# Patient Record
Sex: Male | Born: 1935
Health system: Southern US, Community
[De-identification: ages and names within clinical notes are randomized; demographics above are authoritative.]

## PROBLEM LIST (undated history)

## (undated) DIAGNOSIS — I4891 Unspecified atrial fibrillation: Secondary | ICD-10-CM

## (undated) DIAGNOSIS — E785 Hyperlipidemia, unspecified: Secondary | ICD-10-CM

## (undated) DIAGNOSIS — D649 Anemia, unspecified: Secondary | ICD-10-CM

## (undated) DIAGNOSIS — I5022 Chronic systolic (congestive) heart failure: Secondary | ICD-10-CM

## (undated) DIAGNOSIS — I447 Left bundle-branch block, unspecified: Secondary | ICD-10-CM

## (undated) DIAGNOSIS — G4730 Sleep apnea, unspecified: Secondary | ICD-10-CM

## (undated) DIAGNOSIS — E039 Hypothyroidism, unspecified: Secondary | ICD-10-CM

## (undated) DIAGNOSIS — D696 Thrombocytopenia, unspecified: Secondary | ICD-10-CM

## (undated) DIAGNOSIS — G4733 Obstructive sleep apnea (adult) (pediatric): Secondary | ICD-10-CM

## (undated) DIAGNOSIS — I428 Other cardiomyopathies: Secondary | ICD-10-CM

## (undated) DIAGNOSIS — N400 Enlarged prostate without lower urinary tract symptoms: Secondary | ICD-10-CM

## (undated) DIAGNOSIS — I2510 Atherosclerotic heart disease of native coronary artery without angina pectoris: Secondary | ICD-10-CM

## (undated) DIAGNOSIS — M1990 Unspecified osteoarthritis, unspecified site: Secondary | ICD-10-CM

## (undated) DIAGNOSIS — F32A Depression, unspecified: Secondary | ICD-10-CM

## (undated) DIAGNOSIS — I10 Essential (primary) hypertension: Secondary | ICD-10-CM

## (undated) DIAGNOSIS — F329 Major depressive disorder, single episode, unspecified: Secondary | ICD-10-CM

## (undated) HISTORY — DX: Unspecified osteoarthritis, unspecified site: M19.90

## (undated) HISTORY — PX: CHOLECYSTECTOMY: SHX55

## (undated) HISTORY — PX: CARDIAC DEFIBRILLATOR PLACEMENT: SHX171

## (undated) HISTORY — DX: Major depressive disorder, single episode, unspecified: F32.9

## (undated) HISTORY — PX: TRANSURETHRAL RESECTION OF PROSTATE: SHX73

## (undated) HISTORY — DX: Sleep apnea, unspecified: G47.30

## (undated) HISTORY — DX: Chronic systolic (congestive) heart failure: I50.22

## (undated) HISTORY — DX: Atherosclerotic heart disease of native coronary artery without angina pectoris: I25.10

## (undated) HISTORY — DX: Hyperlipidemia, unspecified: E78.5

## (undated) HISTORY — DX: Other cardiomyopathies: I42.8

## (undated) HISTORY — DX: Unspecified atrial fibrillation: I48.91

## (undated) HISTORY — DX: Anemia, unspecified: D64.9

## (undated) HISTORY — DX: Essential (primary) hypertension: I10

## (undated) HISTORY — DX: Obstructive sleep apnea (adult) (pediatric): G47.33

## (undated) HISTORY — DX: Depression, unspecified: F32.A

## (undated) HISTORY — PX: OTHER SURGICAL HISTORY: SHX169

## (undated) HISTORY — DX: Thrombocytopenia, unspecified: D69.6

## (undated) HISTORY — DX: Hypothyroidism, unspecified: E03.9

## (undated) HISTORY — DX: Left bundle-branch block, unspecified: I44.7

## (undated) HISTORY — DX: Benign prostatic hyperplasia without lower urinary tract symptoms: N40.0

---

## 1998-02-02 ENCOUNTER — Inpatient Hospital Stay (HOSPITAL_COMMUNITY): Admission: EM | Admit: 1998-02-02 | Discharge: 1998-02-06 | Payer: Self-pay | Admitting: Internal Medicine

## 2002-11-30 ENCOUNTER — Ambulatory Visit (HOSPITAL_COMMUNITY): Admission: RE | Admit: 2002-11-30 | Discharge: 2002-11-30 | Payer: Self-pay | Admitting: Gastroenterology

## 2003-03-29 ENCOUNTER — Inpatient Hospital Stay (HOSPITAL_COMMUNITY): Admission: EM | Admit: 2003-03-29 | Discharge: 2003-04-03 | Payer: Self-pay | Admitting: Emergency Medicine

## 2003-03-29 ENCOUNTER — Encounter: Payer: Self-pay | Admitting: Emergency Medicine

## 2003-03-30 ENCOUNTER — Encounter: Payer: Self-pay | Admitting: Internal Medicine

## 2003-03-30 ENCOUNTER — Encounter (INDEPENDENT_AMBULATORY_CARE_PROVIDER_SITE_OTHER): Payer: Self-pay | Admitting: *Deleted

## 2003-05-10 ENCOUNTER — Encounter (HOSPITAL_COMMUNITY): Admission: RE | Admit: 2003-05-10 | Discharge: 2003-08-08 | Payer: Self-pay | Admitting: Emergency Medicine

## 2003-05-22 ENCOUNTER — Encounter: Admission: RE | Admit: 2003-05-22 | Discharge: 2003-05-22 | Payer: Self-pay | Admitting: Orthopedic Surgery

## 2003-08-09 ENCOUNTER — Encounter (HOSPITAL_COMMUNITY): Admission: RE | Admit: 2003-08-09 | Discharge: 2003-08-24 | Payer: Self-pay | Admitting: Cardiology

## 2005-10-11 ENCOUNTER — Ambulatory Visit (HOSPITAL_COMMUNITY): Admission: RE | Admit: 2005-10-11 | Discharge: 2005-10-11 | Payer: Self-pay | Admitting: Internal Medicine

## 2005-10-17 ENCOUNTER — Encounter: Admission: RE | Admit: 2005-10-17 | Discharge: 2005-10-17 | Payer: Self-pay | Admitting: Cardiology

## 2005-10-23 ENCOUNTER — Encounter: Admission: RE | Admit: 2005-10-23 | Discharge: 2005-10-23 | Payer: Self-pay | Admitting: Family Medicine

## 2006-04-08 ENCOUNTER — Encounter: Admission: RE | Admit: 2006-04-08 | Discharge: 2006-04-08 | Payer: Self-pay | Admitting: Family Medicine

## 2006-06-25 DIAGNOSIS — I2510 Atherosclerotic heart disease of native coronary artery without angina pectoris: Secondary | ICD-10-CM

## 2006-06-25 HISTORY — DX: Atherosclerotic heart disease of native coronary artery without angina pectoris: I25.10

## 2006-09-02 ENCOUNTER — Ambulatory Visit (HOSPITAL_COMMUNITY): Admission: RE | Admit: 2006-09-02 | Discharge: 2006-09-03 | Payer: Self-pay | Admitting: Urology

## 2006-09-02 ENCOUNTER — Encounter (INDEPENDENT_AMBULATORY_CARE_PROVIDER_SITE_OTHER): Payer: Self-pay | Admitting: Specialist

## 2007-01-03 ENCOUNTER — Ambulatory Visit (HOSPITAL_COMMUNITY): Admission: RE | Admit: 2007-01-03 | Discharge: 2007-01-03 | Payer: Self-pay | Admitting: Cardiology

## 2007-01-09 ENCOUNTER — Inpatient Hospital Stay (HOSPITAL_COMMUNITY): Admission: AD | Admit: 2007-01-09 | Discharge: 2007-01-11 | Payer: Self-pay | Admitting: Cardiology

## 2007-01-29 ENCOUNTER — Ambulatory Visit (HOSPITAL_COMMUNITY): Admission: RE | Admit: 2007-01-29 | Discharge: 2007-01-30 | Payer: Self-pay | Admitting: Cardiology

## 2007-05-20 ENCOUNTER — Encounter: Admission: RE | Admit: 2007-05-20 | Discharge: 2007-05-20 | Payer: Self-pay | Admitting: Family Medicine

## 2009-06-16 ENCOUNTER — Telehealth: Payer: Self-pay | Admitting: Internal Medicine

## 2009-06-22 DIAGNOSIS — I4811 Longstanding persistent atrial fibrillation: Secondary | ICD-10-CM | POA: Insufficient documentation

## 2009-06-22 DIAGNOSIS — E1169 Type 2 diabetes mellitus with other specified complication: Secondary | ICD-10-CM | POA: Insufficient documentation

## 2009-06-22 DIAGNOSIS — F329 Major depressive disorder, single episode, unspecified: Secondary | ICD-10-CM

## 2009-06-22 DIAGNOSIS — F3289 Other specified depressive episodes: Secondary | ICD-10-CM | POA: Insufficient documentation

## 2009-06-22 DIAGNOSIS — E785 Hyperlipidemia, unspecified: Secondary | ICD-10-CM

## 2009-06-22 DIAGNOSIS — E039 Hypothyroidism, unspecified: Secondary | ICD-10-CM

## 2009-06-22 DIAGNOSIS — G4733 Obstructive sleep apnea (adult) (pediatric): Secondary | ICD-10-CM | POA: Insufficient documentation

## 2009-06-23 ENCOUNTER — Ambulatory Visit: Payer: Self-pay | Admitting: Internal Medicine

## 2009-06-23 DIAGNOSIS — I472 Ventricular tachycardia: Secondary | ICD-10-CM | POA: Insufficient documentation

## 2009-12-18 ENCOUNTER — Emergency Department (HOSPITAL_COMMUNITY): Admission: EM | Admit: 2009-12-18 | Discharge: 2009-12-18 | Payer: Self-pay | Admitting: Emergency Medicine

## 2010-01-05 ENCOUNTER — Encounter: Payer: Self-pay | Admitting: Family Medicine

## 2010-01-11 ENCOUNTER — Encounter: Payer: Self-pay | Admitting: Family Medicine

## 2010-06-28 ENCOUNTER — Encounter: Payer: Self-pay | Admitting: Internal Medicine

## 2010-07-03 ENCOUNTER — Ambulatory Visit (HOSPITAL_COMMUNITY)
Admission: RE | Admit: 2010-07-03 | Discharge: 2010-07-03 | Payer: Self-pay | Source: Home / Self Care | Attending: Cardiology | Admitting: Cardiology

## 2010-07-03 ENCOUNTER — Encounter: Payer: Self-pay | Admitting: Internal Medicine

## 2010-08-09 ENCOUNTER — Institutional Professional Consult (permissible substitution) (INDEPENDENT_AMBULATORY_CARE_PROVIDER_SITE_OTHER): Payer: Medicare Other | Admitting: Internal Medicine

## 2010-08-09 ENCOUNTER — Encounter: Payer: Self-pay | Admitting: Internal Medicine

## 2010-08-09 DIAGNOSIS — R0609 Other forms of dyspnea: Secondary | ICD-10-CM

## 2010-08-09 DIAGNOSIS — R0989 Other specified symptoms and signs involving the circulatory and respiratory systems: Secondary | ICD-10-CM

## 2010-08-16 NOTE — Assessment & Plan Note (Signed)
Summary: Pulmonayr/ new pt eval with walking sats = ok x 3laps   Visit Type:  Initial Consult Copy to:  Dr. Juluis Rainier Primary Provider/Referring Provider:  Dr. Juluis Rainier  CC:  Abnormal Spirometry.  History of Present Illness: 77 yowm never smoker referred to pulmonary clinic by Dr Zachery Dauer for doe / abn pfts  August 09, 2010  1st pulmonary office eval doe worse x years no recent change. Main problem is pulling the trash cans when they are full up to street.  otherwise denies sob, cough, hoarsness. Pt denies any significant sore throat, dysphagia, itching, sneezing,  nasal congestion or excess secretions,  fever, chills, sweats, unintended wt loss, pleuritic or exertional cp, hempoptysis, change in activity tolerance  orthopnea pnd or leg swelling Pt also denies any obvious fluctuation in symptoms with weather or environmental change or other alleviating or aggravating factors.       Current Medications (verified): 1)  Klor-Con M20 20 Meq Cr-Tabs (Potassium Chloride Crys Cr) .... Take One Tablet Once Daily 2)  Coreg 3.125 Mg Tabs (Carvedilol) .... Take One Tablet  Two Times A Day 3)  Levothyroxine Sodium 125 Mcg Tabs (Levothyroxine Sodium) .... Take One Tablet Once Daily 4)  Furosemide 40 Mg Tabs (Furosemide) .Marland Kitchen.. 1 Every Am and 1/2 At Bedtime 5)  Aspirin 81 Mg Tabs (Aspirin) .... Take One Tablet Once Daily 6)  Warfarin Sodium 5 Mg Tabs (Warfarin Sodium) .... Take As Directed 7)  Altace 2.5 Mg Caps (Ramipril) .... Take One Tablet Once Daily 8)  Simvastatin 10 Mg Tabs (Simvastatin) .Marland Kitchen.. 1 Once Daily 9)  Amiodarone Hcl 200 Mg Tabs (Amiodarone Hcl) .Marland Kitchen.. 1 Once Daily 10)  Cpap .... At Bedtime  Allergies (verified): No Known Drug Allergies  Past History:  Past Medical History:  1. Nonischemic cardiomyopathy, EF 10%.   2. BPH.   3. Chronic heart failure, typically compensated.   4. Hypothyroidism.   5. Chronic left bundle branch block.   6. Atrial fibrillation/flutter. >  amiodarone since "2010"  7. Depression.   8. Probable sleep apnea. > positive PSS July 2011 > wears cpap  9. Hyperlipidemia.  10.Medtronic Concerto model number [id number] arthritis 11. ABN PFT's Mild airflow obstruction on coreg      - PFTs 07/03/10 FEV1  2.72 (73%) ratio 65 and DLC 72 > corrects to 86      - No desats walking August 09, 2010   Past Surgical History: 1. TURP as well as circumcision in March of 2008.  2. Status post cholecystectomy.  3. Medtronic Concerto model number [id number] Cholecystectomy 2000  Family History: Heart dz- Mother had CHF  Social History: Married Never smoker Retired from [employer]  No ETOH  Review of Systems       The patient complains of shortness of breath with activity and joint stiffness or pain.  The patient denies shortness of breath at rest, productive cough, non-productive cough, coughing up blood, chest pain, irregular heartbeats, acid heartburn, indigestion, loss of appetite, weight change, abdominal pain, difficulty swallowing, sore throat, tooth/dental problems, headaches, nasal congestion/difficulty breathing through nose, sneezing, itching, ear ache, anxiety, depression, hand/feet swelling, rash, change in color of mucus, and fever.    Vital Signs:  Patient profile:   77 year old male Weight:      239 pounds BMI:     29.20 O2 Sat:      95 % on Room air Temp:     98.0 degrees F oral Pulse rate:   73 /  minute BP sitting:   124 / 72  (left arm)  Vitals Entered By: Vernie Murders (August 09, 2010 8:53 AM)  O2 Flow:  Room air  Serial Vital Signs/Assessments:  Comments: 9:31 AM Ambulatory Pulse Oximetry  Resting; HR__62___    02 Sat__97%ra___  Lap1 (185 feet)   HR__115___   02 Sat__93%ra___ Lap2 (185 feet)   HR__102___   02 Sat__93%ra___    Lap3 (185 feet)   HR__104___   02 Sat___93%ra__  ___Test Completed without Difficulty ___Test Stopped due to:   By: Vernie Murders    Physical Exam  Additional Exam:  wt 243  > 239 August 09, 2010  amb minimally hoarse wm with minimal pseudowheeze resolves with purse lip maneuver  HEENT: nl dentition, turbinates, and orophanx. Nl external ear canals without cough reflex NECK :  without JVD/Nodes/TM/ nl carotid upstrokes bilaterally LUNGS: no acc muscle use, clear to A and P bilaterally without cough on insp or exp maneuvers CV:  RRR  no s3 or murmur or increase in P2, no edema  ABD:  soft and nontender with nl excursion in the supine position. No bruits or organomegaly, bowel sounds nl MS:  warm without deformities, calf tenderness, cyanosis or clubbing SKIN: warm and dry without lesions   NEURO:  alert, approp, no deficits     CXR  Procedure date:  06/28/2010  Findings:      Cardiac enlargement. No CHF  Impression & Recommendations:  Problem # 1:  DYSPNEA (ICD-786.09)  He is on three meds that could cause breathing difficulties, none of which is obviously limiting him at this point  1) Coreg may be causing mild airflow obstruction - would consider Bystolic, the most beta -1  selective Beta blocker available in sample form, with bisoprolol the most selective generic choice  on the market. before considering any bronchodilators should his breathing/ wheezing worsen  2) Amiodarone can cause decrease in DLC0 but this is not significant and there is no cough or desat with ex  3) ACE may be causing the mild hoarseness/ pseudowheeze on exam -  ACE inhibitors are problematic in  pts with airway complaints because  even experienced pulmonologists can't always distinguish ace effects from copd/asthma.  By themselves they don't actually cause a problem, much like oxygen can't by itself start a fire, but they certainly serve as a powerful catalyst or enhancer for any "fire"  or inflammatory process in the upper airway, be it caused by an ET  tube or more commonly reflux (especially in the obese or pts with known GERD or who are on biphoshonates).  In the era of ARB  near equivalency until we have a better handle on the reversibility of the airway problem, it just makes sense to avoid ace entirely in the short run and then decide later, having established a level of airway control using a reasonable limited regimen, whether to add back ace but even then being very careful to observe the pt for worsening airway control and number of meds used/ needed to control symptoms.    See instructions for specific recommendations   Orders: New Patient Level V (65784) Pulse Oximetry, Ambulatory ([address])  Medications Added to Medication List This Visit: 1)  Furosemide 40 Mg Tabs (Furosemide) .Marland Kitchen.. 1 every am and 1/2 at bedtime 2)  Simvastatin 10 Mg Tabs (Simvastatin) .Marland Kitchen.. 1 once daily 3)  Amiodarone Hcl 200 Mg Tabs (Amiodarone hcl) .Marland Kitchen.. 1 once daily 4)  Cpap  .... At bedtime  Patient Instructions:  1)  Your lung function and/ or breathing may be reduced by your medications but it does not appear at this point to be bad enough to recommend any changes because these medications are good for your heart - there are more expensive substitutes available should your breathing worsen which can be considered (we have samples of everything  but the amiodarone). 2)  if breathing gets worse return to [facility] clinic

## 2010-09-10 LAB — CBC
MCH: 32.4 pg (ref 26.0–34.0)
MCHC: 34.3 g/dL (ref 30.0–36.0)
MCV: 94.4 fL (ref 78.0–100.0)
WBC: 6.7 10*3/uL (ref 4.0–10.5)

## 2010-09-10 LAB — POCT I-STAT, CHEM 8
Calcium, Ion: 1.02 mmol/L — ABNORMAL LOW (ref 1.12–1.32)
Creatinine, Ser: 1.7 mg/dL — ABNORMAL HIGH (ref 0.4–1.5)
Glucose, Bld: 106 mg/dL — ABNORMAL HIGH (ref 70–99)
HCT: 42 % (ref 39.0–52.0)
Sodium: 141 mEq/L (ref 135–145)
TCO2: 23 mmol/L (ref 0–100)

## 2010-09-10 LAB — DIFFERENTIAL
Basophils Absolute: 0 10*3/uL (ref 0.0–0.1)
Monocytes Absolute: 0.8 10*3/uL (ref 0.1–1.0)

## 2010-09-10 LAB — POCT CARDIAC MARKERS
CKMB, poc: <1 ng/mL — ABNORMAL LOW (ref 1.0–8.0)
Troponin i, poc: <0.05 ng/mL (ref 0.00–0.09)

## 2010-09-11 ENCOUNTER — Encounter (INDEPENDENT_AMBULATORY_CARE_PROVIDER_SITE_OTHER): Payer: Self-pay | Admitting: *Deleted

## 2010-09-21 NOTE — Letter (Signed)
Summary: Appointment - Reminder 2  [facility], Main Office  [address]   [place], [place] [address]   Phone: [phone]  Fax: [phone]     September 11, 2010 MRN: [id number]   [name] [name] [address] [place], [place]  [address]   Dear [name],  Our records indicate that it is time to schedule a follow-up appointment.  Dr.Allred recommended that you follow up with [place] in April. It is very important that we reach you to schedule this appointment. We look forward to participating in your health care needs. Please contact us at the number listed above at your earliest convenience to schedule your appointment.  If you are unable to make an appointment at this time, give [place] a call so we can update our records.     Sincerely,   Glass blower/designer

## 2010-11-07 NOTE — Discharge Summary (Signed)
NAMEDAWIT, TANKARD               ACCOUNT NO.:  [id number]   MEDICAL RECORD NO.:  [id number]          PATIENT TYPE:  INP   LOCATION:  3715                         FACILITY:  [facility]   PHYSICIAN:  Armanda Magic, M.D.     DATE OF BIRTH:  [birth year]   DATE OF ADMISSION:  01/09/2007  DATE OF DISCHARGE:  01/11/2007                               DISCHARGE SUMMARY   DISCHARGE DIAGNOSES:  1. Acute-on-chronic left systolic heart failure, resolved.  2. Chronic left ventricular systolic failure.  3. Nonischemic cardiomyopathy, ejection fraction is 15%.  4. Benign prostatic hypertrophy.  5. Hypothyroidism.  6. Chronic left bundle-branch block.  7. Hyperlipidemia.  8. History of atrial fibrillation.   HOSPITAL COURSE:  Mr. Paulsen is a 77 year old male patient with known  nonischemic cardiomyopathy who was admitted with a several-day history  of increasing shortness of breath and a ten-pound weight gain.  He was  admitted, diuresed aggressively and by January 11, 2007 was felt to be  ready for discharge to home.   His labs include a white count of 2.9, hemoglobin 13.3, hematocrit 39.3,  platelets 140, sodium 139, potassium 3.7, BUN 21, creatinine 1.25.  BNP  initially 936, but was down to 632 by discharge.  TSH 4.005.   Initial chest x-ray showed cardiomegaly with interstitial edema.   DISCHARGE MEDICATIONS:  1. Lasix 80 mg one p.o. b.i.d.  2. K-Dur 20 mEq a day.  3. Levothyroxine 125 mcg a day.  4. Baby aspirin daily.  5. Coumadin as prior to admission.  6. Lexapro 10 mg a day.  7. Coreg 3.125 mg b.i.d.  8. Altace 2.5 mg daily.   Renal, low-sodium, volume restricted up to 2000 mL daily diet, weigh  daily and bring weights to the office visit.  Patient to see Dr. Mayford Knife  on January 13, 2007 at 3:30 p.m.  Increase activity slowly.  Patient is  discharged to home in stable and improved condition.      Guy Franco, P.A.      Armanda Magic, M.D.  Electronically Signed    LB/MEDQ  D:   01/24/2007  T:  01/25/2007  Job:  [id number]

## 2010-11-07 NOTE — Discharge Summary (Signed)
NAMEDAXON, KYNE               ACCOUNT NO.:  [id number]   MEDICAL RECORD NO.:  [id number]          PATIENT TYPE:  INP   LOCATION:  6531                         FACILITY:  [facility]   PHYSICIAN:  Francisca December, M.D.  DATE OF BIRTH:  1931-09-25   DATE OF ADMISSION:  01/29/2007  DATE OF DISCHARGE:  01/30/2007                               DISCHARGE SUMMARY   DISCHARGE DIAGNOSES:  1. Nonischemic cardiomyopathy, ejection fraction 15%.  2. Known history of coronary artery disease with cardiac      catheterization January 03, 2007.  3. Paroxysmal atrial fibrillation/atrial flutter.  4. Systemic anticoagulation with Coumadin.  5. Hypertension.  6. Hypothyroidism.  7. Dyslipidemia.  8. Chronic compensated congestive heart failure.   HISTORY OF PRESENT ILLNESS:  Mr.  [name] [name] has documented nonischemic  cardiomyopathy as in recent cardiac catheterization of January 03, 2007,  that showed nonobstructive coronary artery disease.  He also has chronic  compensated congestive heart failure as well as a left bundle branch  block and therefore an AICD was implanted on January 29, 2007 under the  care of Dr. Corliss Marcus.  The Medtronic device was utilized.   The patient tolerated the procedure well.  He remained in the hospital  overnight and was discharged to home the following day.  At the time of  this dictation, the chest x-ray has not been done.  Assuming there is no  sign of pneumothorax or other abnormality, the patient will be  discharged to home later today.   MEDICATIONS:  1. Levothyroxine 125 mcg daily.  2. Cipro 10 mg a day.  3. Coreg 3.125 mg twice a day.  4. Potassium 20 mEq 1 tablet daily.  5. Lasix 80 mg twice a day.  6. Xanax p.r.n.  7. Coumadin as prior to admission starting January 31, 2007.  8. Vicodin 1-2 tablets every 8 hours as needed for pain.   Activity and wound care per our device instruction sheet.  Diet should  be a low sodium, heart healthy diet.  Followup in the  Coumadin Clinic on  February 04, 2007 at 11:15 a.m.  Followup with Tillman Sers, N.P. for Dr.  Amil Amen on February 13, 2007 at 9:50 a.m.      [name] [name], P.A.      Francisca December, M.D.  Electronically Signed    LB/MEDQ  D:  01/30/2007  T:  01/30/2007  Job:  [id number]   cc:   Sigmund Hazel, M.D.

## 2010-11-07 NOTE — H&P (Signed)
[name], [name]               ACCOUNT NO.:  [id number]   MEDICAL RECORD NO.:  [id number]          PATIENT TYPE:  INP   LOCATION:  3715                         FACILITY:  [facility]   PHYSICIAN:  Armanda Magic, M.D.     DATE OF BIRTH:  August 05, 1931   DATE OF ADMISSION:  01/09/2007  DATE OF DISCHARGE:                              HISTORY & PHYSICAL   CHIEF COMPLAINT:  Fluid overload.   HISTORY OF PRESENT ILLNESS:  [name] [name] is a 77 year old male patient  with a known history of nonischemic cardiomyopathy with an EF around 10%  documented by cardiac catheterization on January 03, 2007.  That  catheterization revealed nonobstructive coronary artery disease.  A 2-D  echo in June 2008 showed moderately dilated LV dimensions with severe LV  dysfunction and paradoxical septal motion due to bundle branch block.  There was moderate aortic valve sclerosis but no stenosis.  He did  exhibit mild aortic insufficiency, moderate mitral regurgitation, and  mild tricuspid regurgitation.  Left atrial enlargement was present.   He was seen in the office on January 09, 2007 complaining of a several-day  history of increasing shortness of breath, lower extremity edema, and  weight gain of about 10 pounds over a 10-day period.  He increased his  Lasix with little effect.  He was admitted for aggressive medical  management, including IV diuresis.   PAST MEDICAL HISTORY:  1. Nonischemic cardiomyopathy, EF 10%.  2. BPH.  3. Chronic heart failure, typically compensated.  4. Hypothyroidism.  5. Chronic left bundle branch block.  6. Atrial fibrillation/flutter.  7. Depression.  8. Probable sleep apnea.  9. Hyperlipidemia.   PAST SURGICAL HISTORY:  1. TURP as well as circumcision in March of 2008.  2. Status post cholecystectomy.   SOCIAL HISTORY:  Former smoker, social alcohol use, no illicit drug use,  married.   FAMILY HISTORY:  Noncontributory.   ALLERGIES:  NO KNOWN DRUG ALLERGIES.   MEDICATIONS  PRIOR TO ADMISSION:  1. Levothyroxine 125 mcg a day.  2. Baby aspirin daily.  3. Altace 2.5 mg a day.  4. Lasix 40 mg a day.  5. Coumadin.  6. Lexapro 10 mg a day.  7. Coreg 3.125 p.o. b.i.d.   Weights from July 8 to July 17 go from 236 pounds to 242 pounds  respectively.   PHYSICAL EXAMINATION:  VITAL SIGNS:  Blood pressure 100/68, heart rate  68, respirations 20, weight 242.8.  HEENT:  Grossly normal.  Sclerae is clear.  Conjunctivae normal.  Nares  without drainage.  NECK:  No carotids, no bruits, JVD, or thyromegaly.  CHEST:  Bibasilar crackles.  HEART:  Irregular.  ABDOMEN:  Obese.  LOWER EXTREMITY:  No pitting edema.  NEURO:  Alert and oriented, normal mood and affect.  SKIN:  Warm and dry.   LABS:  EKG and chest x-ray are currently pending.   ASSESSMENT AND PLAN:  1. Acute-on-chronic left systolic heart failure.  2. Nonischemic cardiomyopathy, ejection fraction of 10%.  3. Atrial fibrillation/flutter.  4. Hypothyroidism, treated.  5. Hyperlipidemia.  6. Depression.  7. Probable sleep apnea.  The patient was seen and examined by Dr. Armanda Magic who will perform  daily weights, strict input and output, check labs, intravenous  diuresis, and, ultimately, the patient will need an implantable  cardioverter/defibrillator plus or minus a biventricular device  implantable, which I suspect will happen within the next 2 weeks.      [name] [name], P.A.      Armanda Magic, M.D.  Electronically Signed    LB/MEDQ  D:  01/10/2007  T:  01/11/2007  Job:  [id number]

## 2010-11-07 NOTE — Cardiovascular Report (Signed)
NAME:  [name] [name], [name] [name]               ACCOUNT NO.:  [id number]   MEDICAL RECORD NO.:  [id number]          PATIENT TYPE:  OIB   LOCATION:  2853                         FACILITY:  [facility]   PHYSICIAN:  Armanda Magic, M.D.     DATE OF BIRTH:  07/25/1931   DATE OF PROCEDURE:  DATE OF DISCHARGE:  01/03/2007                            CARDIAC CATHETERIZATION   This is a 77 year old male with a history of hypertension,  hypothyroidism, nonischemic cardiomyopathy, who presents with increasing  shortness of breath and abnormal Cardiolite, showing a reversible defect  in the anterior wall.  He now presents for cardiac catheterization.   PROCEDURES:  Left heart cath, coronary angiography, left  ventriculography.   OPERATOR:  Armanda Magic, M.D.   INDICATIONS:  Chest pain, shortness of breath, abnormal Cardiolite.   COMPLICATIONS:  None.   IV ACCESS:  Via right femoral artery, 6-French sheath.   The patient was brought to cardiac catheterization laboratory in a  fasting non-sedated state.  Informed consent was obtained.  The patient  was connected to continuous heart rate and pulse oximetry monitoring,  intermittent blood pressure monitoring.  The right groin was prepped and  draped in sterile fashion.  One percent Xylocaine was used for local  anesthesia.  Using modified Seldinger technique, a 6-French sheath was  placed in the right femoral artery.  Under fluoroscopic guidance, a 6-  [place] JL-4 catheter was placed in left coronary artery.  Multiple cine  films were taken at 30-degree RAO, 40-degree LAO views.  The catheter  could not adequately engage the coronary ostium, and the catheter was  exchanged out over a guidewire for 6-French JL-5 catheter, which  successfully engaged the coronary ostia.  Multiple cine films were taken  at 30-degree RAO and 40-degree LAO views.  This catheter was then  exchanged out over a guidewire for a 6-French JR-4 catheter, which was  placed in fluoroscopic  guidance, right coronary artery.  Multiple cine  films were taken at 30-degree RAO, 40-degree LAO views.  His catheter  was then exchanged out over a guidewire for a 6-French angled pigtail  catheter, which was placed under fluoroscopic guidance in the left  ventricular cavity.  Left ventriculography was performed in the 30-  degree RAO view, using a total of 30 mL of contrast at 15 mL per second.  The catheter was then pulled back across the aortic valve with no  significant gradient noted.  At the end of procedure, all catheters and  sheaths were removed.  Manual compression was performed, so adequate  hemostasis was obtained.  The patient was transferred back to room in  stable condition.   RESULTS:  Left main coronary artery is widely patent and trifurcates  into left anterior descending artery of the ramus branch and left  circumflex artery.   The left anterior descending artery is widely patent throughout its  course.  The apex giving rise to two diagonal branches, both of which  are widely patent.  There are room luminal irregularities up to 30%.   The ramus branch is widely patent with some luminal irregularities and  trifurcates into three daughter branches, all that were widely patent.   The left circumflex is widely patent throughout its course in the AV  groove, with luminal irregularities up to 20% in 30%.   The right coronary artery is widely patent and bifurcates distally in  the posterior descending artery and posterior lateral artery, both of  which are widely patent.  There are luminal irregularities of 20%.   Left ventriculography shows severe LV dysfunction, EF 10% to 20%, LV  pressure 102/20 mmHg, LVEDP 28 mmHg.  Aortic pressure was 1 to 2/78  mmHg.  1. Non-obstructive coronary disease.  2. Markedly dilated LV with severe LV dysfunction, EF 10% to 20%.  3. Non-ischemic dilated cardiomyopathy.  4. Elevated LVEDP.  5. Shortness of breath.  6. A-fib flutter.   7. Depression.  8. Probable sleep apnea.   PLAN:  Aggressive medical therapy for heart failure, discontinue Imdur  for now, continue Altace of 2.5 mg a day and start Coreg 3.125 mg b.i.d.  Given that his blood pressure is borderline low, I would rather get him  off the Imdur and start him on a beta blocker to try controlled heart  rate better.  We will continue Lasix but increase to 40 mg a day,  because of elevated LVEDP.  Of note, I did discuss with his wife the  results, and she told me that he has been very depressed, sleeping most  of the day and having problems with early morning wakening and thinks  that he is very depressed, and I will start Lexapro 10 mg a day.  Will  get an outpatient sleep study to rule out sleep apnea, re-start his  Coumadin, and he will follow up with me in 1 week.  Down the road, he  will probably need a AICD, because of his LV dysfunction, plus or minus  BiV.      Armanda Magic, M.D.  Electronically Signed     TT/MEDQ  D:  01/03/2007  T:  01/04/2007  Job:  [id number]   cc:   Sigmund Hazel, M.D.

## 2010-11-07 NOTE — Op Note (Signed)
[name], [name]               ACCOUNT NO.:  [id number]   MEDICAL RECORD NO.:  [id number]          PATIENT TYPE:  INP   LOCATION:  2807                         FACILITY:  [facility]   PHYSICIAN:  Francisca December, M.D.  DATE OF BIRTH:  [birth year]   DATE OF PROCEDURE:  01/29/2007  DATE OF DISCHARGE:                               OPERATIVE REPORT   PROCEDURES PERFORMED:  1. Insertion, dual-chamber implantable cardiac defibrillator, with      biventricular pacing.  2. Left subclavian venogram.  3. Coronary sinus venogram.  4. Defibrillation threshold testing.   INDICATIONS:  [name] is a 77 year old man with severe  nonischemic cardiomyopathy, LVEF 10-20%.  He has NYHA class III symptoms  of heart failure.  He also has a widened QRS secondary to a left bundle  branch block.  He is brought to the catheterization laboratory at this  time for insertion of the above device for prophylaxis of serious  ventricular arrhythmia and for treatment with biventricular pacemaker.   PROCEDURAL NOTE:  The patient was brought to the cardiac catheterization  laboratory in the fasting state.  The left prepectoral region was  prepped and draped in the usual sterile fashion.  Local anesthesia was  obtained with infiltration of 1% lidocaine with epinephrine throughout  the left prepectoral region.  A left subclavian venogram was then  performed with a peripheral injection of 20 mL of Omnipaque.  A digital  cine angiogram was obtained and road-mapped to guide future left  subclavian puncture.  The venogram did demonstrate the vein to be widely  patent and coursing in a normal fashion over the anterior surface of the  first rib and beneath the middle third of the clavicle.  There was no  evidence for persistence of the left superior vena cava.   A 7-8 cm incision was then made in the deltopectoral groove and this was  carried down by sharp dissection and electrocautery to the prepectoral  fascia.   There a plane was lifted and a pocket formed inferiorly and  medially.  The pocket was then packed with a 1% kanamycin-soaked gauze.  Three separate left subclavian punctures were then performed with an 18-  gauge thin-wall needle, through which was passed a 0.038-inch tight J  guidewire.  Over the initial guidewire a 9-French tearaway sheath and  dilator were advanced.  The dilator and wire were removed and the  ventricular lead was advanced to the level of the right atrium.  The  sheath was torn away.  Using standard technique and fluoroscopic  landmarks, the lead was manipulated into the right ventricular apex.  There excellent pacing parameters were obtained as will be noted below.  This was an active-fixation lead and the screw was advanced as  appropriate.  It was tested for diaphragmatic pacing at 10 V and none  was found.  The lead was then sutured into place using three separate 0  silk ligatures.  Over the second guidewire a 7-French tearaway sheath  and dilator were then advanced.  The dilator and wire were removed and  the atrial lead  was advanced to the level of the right atrium and the  sheath was torn away.  Again using standard technique and fluoroscopic  landmarks, the lead was manipulated into the right atrial appendage.  This was an active-fixation lead as well and the screw was advanced as  appropriate.  It was tested for adequate pacing parameters and these are  reported below.  It was tested for diaphragmatic pacing at 10 V and none  was found.  The lead was then sutured into place using three separate 0  silk ligatures.  I then proceeded with cannulation of the coronary  sinus.  This proved to be quite difficult.  Attempts were made with a  Medtronic MB catheter, an MB-1 catheter and an MB-2 catheter, model  numbers 6216, [id number], and 6216A, respectively.  Finally with the  assistance of Dr. Lewayne Bunting and the use of the MB-2 catheter through  which was passed a  Josephson EP catheter, I was successfully able to  cannulate the coronary sinus.  The Josephson catheter was removed.  A  6215 80-cm balloon occluding venogram device was then advanced over a  0.025 wire into the coronary sinus.  The balloon was inflated and  angiograms performed in LAO and RAO projections.  An adequate left  lateral vein was selected and this was cannulated after removing the  balloon with a 0.014-inch Prowater guidewire.  Over this guidewire a [employer].  Jude QuickFlex model number 1156T, serial number [id number], was then  advanced into the left lateral vein.  There excellent pacing parameters  were obtained as will be noted below.  It was tested for diaphragmatic  pacing at 10 V and none was found.  The guiding catheter was then  removed by the slit technique and the lead was sutured into place using  three separate 0 silk ligatures.  The kanamycin-soaked gauze was then  removed from the pocket.  The pocket was copiously irrigated using 1%  kanamycin solution.  The leads were then attached to the pace shock  generator, carefully identifying each by its serial number and placing  each into the appropriate receptacle under the supervision of the  Medtronic representative.  Each lead was tightened into place and tested  for security.  The leads were then wound beneath the pacing generator  and the generator was placed in the pocket.  Previously I had had to  place a figure-of-eight hemostasis suture around the leads with an 0  silk.  The device was then secured to the pocket with an 0 silk  anchoring suture and to the pectoralis muscle.  There was generalized  oozing within the muscular wall and the walls of the wound.  Surgicel  was applied.  The wound was closed using 2-0 Vicryl in a running fashion  in two layers for the subcutaneous tissue.  The skin was approximated  using 4-0 Vicryl in a running subcuticular fashion.  Steri-Strips and a  sterile dressing were applied and  the patient was transported to the  recovery area in stable condition.   Prior to closing the pocket with the device in the pocket, I performed  defibrillation threshold testing.  Ventricular fibrillation was promptly  induced by the shock on T technique.  It was promptly detected and the  device charged, delivering a 20-joule shock.  Prompt return of atrial  fibrillation-flutter with pacing was obtained.  The shock impedance was  40 joules and the detection and charge time was 9 seconds.   EQUIPMENT  DATA:  The ventricular lead is a Teacher, English as a foreign language number [id number], serial number [id number].  The atrial lead is  a Medtronic CapSureFix model number B3077988, serial number [id number]. The  ventricular lead was reported above.  The pace shock generator is a  Medtronic Concerto model number D5359719, serial number [id number].   PACING DATA:  The right ventricular lead detected a 9.9 mV R wave.  The  pacing threshold was 0.8 V at 0.5 msec pulse width.  The impedance was  725 ohms,  resulting in a current at capture threshold of 1.3 mA.  The  atrial lead detected 2.3 mV flutter wave.  The impedance was 491 ohms.  The left ventricular lead detected a 10.2 mV R wave.  The pacing  threshold was 0.6 V at 0.5 msec pulse width.  The impedance was 941  ohms, resulting in a current at capture threshold of 0.6 mA.      Francisca December, M.D.  Electronically Signed     JHE/MEDQ  D:  01/29/2007  T:  01/29/2007  Job:  [id number]   cc:   Armanda Magic, M.D.

## 2010-11-10 NOTE — Discharge Summary (Signed)
NAME:  [name] [name], [name] [name]                         ACCOUNT NO.:  [id number]   MEDICAL RECORD NO.:  [id number]                   PATIENT TYPE:  INP   LOCATION:  0371                                 [facility]ACILITY:  [facility]   PHYSICIAN:  Deirdre Peer. Polite, M.D.              DATE OF BIRTH:  1931/11/29   DATE OF ADMISSION:  03/29/2003  DATE OF DISCHARGE:                                 DISCHARGE SUMMARY   DISCHARGE DIAGNOSES:  1. Newly diagnosed congestive heart failure, nonischemic etiology, ejection     fraction 20-30%.  2. Hypothyroid, elevated thyroid stimulating hormone on admission of 9.6.     Dosage adjusted.  Recommend four- to six-week follow-up.  3. Benign prostatic hypertrophy, asymptomatic.   DISCHARGE MEDICATIONS:  1. Proscar 5 mg daily.  2. Potassium 20 mEq daily.  3. Synthroid 375 mcg daily.  4. Lasix 40 mg daily.  5. Altace 5 mg daily.  6. Aspirin 325 mg daily.  7. Isosorbide 30 mg daily.  8. Aldactone 25 mg daily.  9. Coreg 3.125 mg b.i.d.   STUDIES:  The patient had a BMET, CBC essentially within normal limits.  Lipid panel:  Total cholesterol 161, LDL of 114, HDL of 35.  Serial cardiac  enzymes negative for ischemia.  TSH 9.6.   Chest x-ray:  Significant for cardiomegaly and congestive heart failure.   EKG:  Normal sinus rhythm with PVCs.   Echocardiogram:  Left ventricle moderately dilated, EF of 20-30%.   Cardiac catheterization report:  RCA, 20% proximal lesion.  LAD, 20%  proximal lesion.  Circumflex, 20% proximal lesion.   CONDITION ON DISCHARGE:  Stable.   FOLLOW-UP:  The patient is planned to follow up with primary M.D. in  approximately one week.  He is to follow up with [name] [name],  cardiology, in two weeks.   HISTORY OF PRESENT ILLNESS:  This is a 77 year old male with a known history  of BPH, hypertension, hypothyroidism, presented to the ED with complaints of  shortness of breath, dyspnea on exertion.  Evaluation in the ED was  significant for  congestive heart failure.  EKG and troponins on admission  were within normal limits.  However, admission was deemed necessary for  further evaluation and treatment as the patient did not carry a known  diagnosis of congestive heart failure.  Please see dictated H&P for further  details of HPI.   PAST MEDICAL HISTORY:  As stated above.   MEDICATIONS ON ADMISSION:  Proscar, Synthroid, and Cardura.   ALLERGIES:  None.   SOCIAL HISTORY:  The patient has not smoked for many years.  Social ETOH.  No drugs.   HOSPITAL COURSE:  #1 - NEWLY DIAGNOSED CONGESTIVE HEART FAILURE:  As stated,  [name] [name] was admitted to [facility] to a telemetry floor for  evaluation and treatment of signs and symptoms consistent with congestive  heart failure.  The patient was treated  with fluid restriction, ACE  inhibitor, and IV diuretics.  Also, a cardiology consult was obtained as  well as a 2-D echo.  Results of the 2-D echo showed significant LV  dysfunction with hypokinesis with an EF of 20-30%.  After ruling out for  cardiac ischemia, it was felt that risk stratification was indicated.  As  stated, the patient was seen by cardiology and was recommended to have  cardiac catheterization.  The patient's cardiac catheterization showed  significant LV dysfunction with an EF of 20-30%.  However, the patient's  coronary disease only revealed minimal disease, as stated above.  Medical  management is recommended for the patient's nonobstructive coronary artery  disease and significant LV dysfunction.  New medications include Imdur,  Altace, Lasix, Aldactone, and Coreg.  The patient will have outpatient  follow-up with [facility], [name] [name] in approximately two  weeks.  The patient is informed not to begin working until seen by Dr.  Mayford [name].  The patient may be started on cardiac rehabilitation.  As for the  patient's diet, he has been informed of the necessity to maintain a low-  sodium diet  and also a fluid-restricted diet of approximately 1500 mL.  He  has also been informed of the need to purchase a scale and to record his  weights daily and to notify his physicians if sudden change in weight of  three or more pounds within two days.  The patient expresses understanding  of this information as well as his wife.  At the day of discharge the  patient is able to ambulate the floor without dyspnea, without chest pain.  He is also able to sleep in the recumbent position without PND.  At this  time it is felt prudent that the patient be discharged to home, with  outpatient follow-up as listed above, two weeks with [name] [name] and in one  to two weeks with his primary M.D..   #2 - HYPOTHYROIDISM:  The patient carries a diagnosis of hypothyroidism.  TSH was obtained during this hospitalization.  The results showed an  elevation in the TSH.  The patient's history was reviewed, and he stated he  had not had his level checked in a while, and he does state that he has been  compliant with his medications.  Therefore, this suggests that the patient's  medications need to be adjusted.  The patient arrived taking Synthroid 125  mcg.  This has been increased to 175 mcg.  The patient's TSH should be  rechecked per his primary M.D. and medications titrated as needed.   The patient has other medical problems which are stable and have been stable  throughout this hospitalization.  He is asked to continue his current  treatment.  However, the patient is asked to not take his Cardura as he has  been placed  on several other medications that can have an antihypertensive effect as  well as Cardura.  Therefore, the patient is asked not to take Cardura at  this time and to follow up with his primary M.D. and only have medications  adjusted by his primary M.D. and cardiologist.                                               Deirdre Peer. Polite, M.D.   RDP/MEDQ  D:  04/03/2003  T:  04/03/2003  Job:   [id number]   cc:   [name] [name], M.D.  [address]  [place]  [place] [address]  Fax: [phone]

## 2010-11-10 NOTE — Op Note (Signed)
   NAME:  [name] [name], [name] [name]                         ACCOUNT NO.:  [id number]   MEDICAL RECORD NO.:  [id number]                   PATIENT TYPE:  AMB   LOCATION:  ENDO                                 FACILITY:  [facility]   PHYSICIAN:  Graylin Shiver, M.D.                DATE OF BIRTH:  April 15, 1932   DATE OF PROCEDURE:  11/30/2002  DATE OF DISCHARGE:                                 OPERATIVE REPORT   PROCEDURE PERFORMED:  Colonoscopy.   INDICATIONS FOR PROCEDURE:  Screening.   Informed consent was obtained.   PREMEDICATION:  Fentanyl 50 mcg IV, Versed 5 mg IV.   DESCRIPTION OF PROCEDURE:  With the patient in the left lateral decubitus  position, a rectal exam was performed.  No masses were felt.  The Olympus  colonoscope was inserted into the rectum and advanced around the colon to  the cecum.  Cecal landmarks were identified. The cecum and ascending colon  were normal.  The transverse colon was normal.  The descending colon and  sigmoid revealed moderate diverticulosis.  The rectum was normal.  The  patient tolerated the procedure well without complications.   IMPRESSION:  Diverticulosis of the left colon.   RECOMMENDATIONS:  I would recommend a follow-up colonoscopy again in 10  years.                                               Graylin Shiver, M.D.    [name]  D:  11/30/2002  T:  11/30/2002  Job:  [id number]   cc:   Chales Salmon. Abigail Miyamoto, M.D.  [address]  [place]  [place] [address]  Fax: [phone]

## 2010-11-10 NOTE — H&P (Signed)
NAME:  [name] [name], [name] [name]                         ACCOUNT NO.:  [id number]   MEDICAL RECORD NO.:  [id number]                   PATIENT TYPE:  INP   LOCATION:  0371                                 FACILITY:  [facility]   PHYSICIAN:  Hollice Espy, M.D.            DATE OF BIRTH:  January 17, 1932   DATE OF ADMISSION:  03/29/2003  DATE OF DISCHARGE:                                HISTORY & PHYSICAL   PRIMARY CARE PHYSICIAN:  Chales Salmon. Abigail Miyamoto, M.D.   HISTORY OF PRESENT ILLNESS:  This is a 77 year old white male with a past  medical history of  benign prostatic hypertrophy, hypertension and  hypothyroidism, who presents with new onset congestive heart failure. The  patient stated that  he has been  in generally good health but he has noted  some mild low dyspnea on exertion over the last few months. It has  progressed to the point where he cannot  lie  flat  on his back when he  sleeps, and over the last few  weeks he was having increased  visible  shortness of breath. Finally the patient became severely short of breath  last night and came into the  emergency room this morning.   On chest x-ray he was found to have bilateral pleural effusions and  otherwise  no other  complaints. He received some oxygen and Lasix  and  diuresed well and  began to feel better. Currently the patient states that  he is very tired. He did not sleep well for the last few days, but other  than that he  states that his  shortness of breath has improved. He denies  any headaches, visual changes, dysphagia, chest pain, palpitations. He has  been complaining of a nonproductive cough as well. He denies any wheezing.  He denies any abdominal pain, constipation, diarrhea, hematuria, dysuria,  extremity pain, weakness or nausea or vomiting.   PAST MEDICAL HISTORY:  1. Hypertension.  2. Hyperlipidemia.  3. Synthroid.  4. Benign prostatic hypertrophy.   MEDICATIONS:  1. Proscar 5 daily.  2. Synthroid 125 micrograms  daily.  3. Cardura 4 mg p.o. every day.   ALLERGIES:  The patient denies any type of allergies.   SOCIAL HISTORY:  The patient states he has not smoked in many years. He is  not a heavy drinker. He denies  any drug use. He denies any herbal  medications  or over-the-counter medications.   FAMILY HISTORY:  Noncontributory.   PHYSICAL EXAMINATION:  VITAL SIGNS:  Blood pressure 118/75, pulse 82,  respirations 20, afebrile, oxygen saturation 92% on 2 liters nasal cannula.  GENERAL:  This is a 77 year old white male who appears his  stated age  looking very fatigued but  otherwise in no active distress. He is alert and  oriented x3.  HEENT:  Normocephalic and atraumatic. He has no significant jugular venous  distention. Mucous membranes are normal.  CARDIOVASCULAR:  Regular rate and rhythm, S1, S2, could not appreciate a  murmur.  LUNGS:  Decreased breath sounds bilaterally.  ABDOMEN:  Soft, nontender, nondistended. Positive bowel sounds.  EXTREMITIES:  Trace pitting edema.   LABORATORY DATA:  White count 7.1, hemoglobin  14.2, hematocrit 41.6, MCV  90. Platelet count 136 with an 80% shift. Sodium 139, potassium 4, chloride  106, bicarbonate 28, BUN 13, creatinine 1.2, glucose 119. Total bilirubin  1.3. The rest of his liver function tests are normal. His CPK and MB are 113  and 2.7, index of 2.4. Troponin I 0.04. BNP  is 477.   Chest x-ray showed bilateral pleural effusions.   ASSESSMENT AND PLAN:  1. Apparently new onset congestive heart failure. Will get a 2D     echocardiogram and will diurese the patient with IV Lasix 80 b.i.d. We     will watch his renal insufficiency as he has a slightly elevated     creatinine as well. Have held his Cardura and instead have started him on     enalapril 2.5 b.i.d. Continuing with his aspirin  as well. Will continue     to check cardiac enzymes to ensure he has not had any ischemic event. His     EKG shows no evidence of any acute ST changes.  Will repeat a chest x-ray     tomorrow morning. In addition will have a dietitian talk to him about     advising a low sodium diet, and once he appears to be more diuresed will     start him on a beta blocker.  2. In regards to the patient's hypertension will be as above.  3. For the patient's benign prostatic hypertrophy will continue his Proscar.  4. In regards to the patient's hypothyroidism will continue his Synthroid.     Of note the patient had a sleep study done 8 years ago which was     negative, but according to the patient's wife, he snores all the time,     and anytime when he sits down it almost immediately falls asleep. The     patient states that he is never well rested and on physical examination     he is noted to have a very narrow airway. Will check a thyroid function     panel, and likely it is possible the patient may have  sleep apnea, and     once he is medically stable and cleared, he will likely  benefit from an     outpatient sleep study as well.                                               Hollice Espy, M.D.    SKK/MEDQ  D:  03/29/2003  T:  03/29/2003  Job:  [id number]   cc:   Chales Salmon. Abigail Miyamoto, M.D.  [address]  [place]  [place] [address]  Fax: [phone]

## 2010-11-10 NOTE — Cardiovascular Report (Signed)
NAME:  [name] [name], [name] [name]                         ACCOUNT NO.:  [id number]   MEDICAL RECORD NO.:  [id number]                   PATIENT TYPE:  OUT   LOCATION:  CATH                                 FACILITY:  [facility]   PHYSICIAN:  Armanda Magic, M.D.                  DATE OF BIRTH:  [birth year]   DATE OF PROCEDURE:  04/01/2003  DATE OF DISCHARGE:  04/01/2003                              CARDIAC CATHETERIZATION   <REFERRING PHYSICIAN/>  Dr. Gordy Councilman and Dr. Chales Salmon. Thacker.   PROCEDURE:  Left heart catheterization, coronary angiography, left  ventriculography. Intravenous access to the right femoral artery 6 French  sheath.   OPERATOR:  Armanda Magic, M.D.   INDICATIONS FOR PROCEDURE:  Congestive heart failure and dilated  cardiomyopathy.   COMPLICATIONS:  None.   INDICATIONS FOR PROCEDURE:  This is a 77 year old male who was admitted with  2 to 3 months of increasing  shortness of breath and dyspnea on exertion. He  was noted to be in congestive heart failure on  presentation and was noted  to have an ejection fraction of 20% to 30% with dilated cardiomyopathy. He  now presents for heart catheterization.   DESCRIPTION OF PROCEDURE:  The patient was brought to the cardiac  catheterization laboratory  in a fasting, nonsedated state. Informed consent  was obtained. The patient was connected to begin to his heart rate and pulse  oximetry monitoring and intermittent  blood pressure monitoring. The right  groin was prepped and draped in a sterile fashion. Then 1% Xylocaine was  used for local anesthesia.   Using the modified Seldinger technique, a 6 French sheath was placed in the  right femoral artery. Under fluoroscopic guidance a 6 [place] JL-4 catheter  was placed into the left coronary artery. Multiple plain films were taken in  a 30 degree RAO, 40 degree LAO views. This catheter was then exchanged out  over a guide wire for a 6 [place] JR-4 catheter which was placed under  fluoroscopic guidance in the right coronary artery. Multiple  cine films  were taken in the 30 degree RAO and 40 degree LAO views.   The catheter was then exchanged out over a guide wire for a 6 French  angled  pigtail catheter which was placed under fluoroscopic guidance in the left  ventricular cavity. Left ventriculography was performed using 30 mL of  contrast at 13 mL per second in the 30 degree RAO view. The catheter was  then pulled back across the aortic valve with no significant aortic valve  gradient.   At the end of the procedure all catheters and sheaths were removed. Manual  compression was performed. Adequate hemostasis was obtained. The patient was  transferred back to her room in stable condition.   RESULTS:  1. Left main coronary artery is widely patent and trifurcates into a left     anterior descending artery, ramus branch  and left anterior descending     coronary artery. The left circumflex artery is widely patent throughout     the course. The ramus branch has a 20% proximal narrowing and then it     trifurcates into 3 daughter branches, all of which are widely patent. The     LAD has a proximal 20% narrowing and then gives rise to 2 large diagonal     branches, both of which are widely patent. The rest of the LAD is widely     patent throughout the course of the apex. The right coronary artery had     20% luminal irregularities primarily in the midportion of the vessel. It     then bifurcates into the posterior descending artery and posterolateral     artery. The posterior descending artery has a 3% mid stenosis.  2. Left ventriculography revealed a moderately dilated left ventricular     cavity with inferior  akinesis and severe global hypokinesis, ejection     fraction of 20%. Left ventricular pressure 131/10 mmHg. Aortic pressure     126/75 mmHg. Left ventricular end diastolic pressure 22 mmHg.   ASSESSMENT:  1. Nonobstructive coronary disease.  2. Moderately  dilated left ventricular cavity with severe left ventricular     dysfunction consistent with idiopathic dilated cardiomyopathy.  3. Congestive heart failure, improved.   PLAN:  Aggressive medical therapy. Will start Imdur 30 mg a day, Altace 5 mg  a day, Lasix 40 mg a day. Start Aldactone 25 mg a day. Add Coreg 3.125 mg  b.i.d. We will check serum ferritin, serum electrophoresis, HIV and TSH to  rule out other causes of dilated cardiomyopathy.                                               Armanda Magic, M.D.    TT/MEDQ  D:  05/26/2003  T:  05/26/2003  Job:  [id number]   cc:   Chales Salmon. Abigail Miyamoto, M.D.  [address]  [place]  [place] [address]  Fax: [phone]

## 2010-11-10 NOTE — H&P (Signed)
[name] [name], [name] [name]               ACCOUNT NO.:  [id number]   MEDICAL RECORD NO.:  [id number]          PATIENT TYPE:  AMB   LOCATION:  DAY                          FACILITY:  [facility]   PHYSICIAN:  Sigmund I. Patsi Sears, M.D.DATE OF BIRTH:  03/21/1932   DATE OF ADMISSION:  09/02/2006  DATE OF DISCHARGE:                              HISTORY & PHYSICAL   PREOPERATIVE DIAGNOSES:  1. Benign prostatic hypertrophy.  2. Balanitis.   POSTOPERATIVE DIAGNOSES:  1. Benign prostatic hypertrophy.  2. Balanitis.   OPERATION:  1. TURP.  2. Circumcision.   SURGEON:  Sigmund I. Patsi Sears, M.D.   ANESTHESIA:  General LMA.   PREPARATION:  After appropriate preanesthesia the patient is brought to  the operating room and placed upon the operating room in the dorsal  supine position where general LMA anesthesia was introduced.  He was  then replaced in the dorsal lithotomy position where the pubis was  prepped with Betadine solution and draped in the usual fashion.  The  penis could only be prepped slightly, because of the rather severe  phimosis that was noted.  The patient had received IV antibiotics prior  to intubation.   REVIEW OF HISTORY:  This 77 year old male, has been evaluated for  bladder outlet symptoms, failing Cardura and  Proscar, and is also seen  for chronic balanitis with phimosis.  Because of the patient's history  of heart failure and atrial fibrillation he was evaluated by Dr. Eliott Nine preoperatively, with cardiology clearance preoperatively.  He is  now for surgical intervention.   PROCEDURE:  The urethra is dilated to a size 67 [place] with the [employer].  Cystoscopy reveals a chronically obstructed and enlarged  prostate secondary to trilobar BPH.  Trabeculation and early cellule  formation is noted in the bladder, but there is no evidence of  diverticular formation, and no evidence of bladder stone or tumor.  Clear efflux is seen from both orifices.   Resection is begun at the 7 o'clock to the 5 o'clock position, and then  from the 11 o'clock to the 7 o'clock position, and from the 1 o'clock to  the 5 o'clock positions.  Bleeding is electrocoagulated, and the chips  are evacuated free from the bladder.  A 24 Ainsworth catheter was placed  with 30 mL in the balloon, irrigated, and plugged.   Attention was then directed to the phimotic foreskin, where a blue  marking pen is used to mark out the entire circumcision, both the  pericoronal and periglandular surfaces.  Incisions are made, and the  foreskin is removed.  A frenular attachment is identified, clamped and  cut, and sutured so that the frenular artery would be controlled.   Four separate quadrants of 4-0 Vicryl suture are then created, and each  quadrant is closed with interrupted 4-0 Vicryl suture.  No bleeding was  noted.  A sterile dressing is then applied, and 0.25 plain Marcaine is  injected circumferentially around the base of the penis.  The patient  was then awakened and taken to the recovery room in good condition.  Sigmund I. Patsi Sears, M.D.  Electronically Signed     SIT/MEDQ  D:  09/02/2006  T:  09/02/2006  Job:  [id number]

## 2010-12-15 ENCOUNTER — Encounter: Payer: Self-pay | Admitting: *Deleted

## 2011-01-10 ENCOUNTER — Encounter: Payer: Self-pay | Admitting: Internal Medicine

## 2011-01-10 ENCOUNTER — Encounter: Payer: Self-pay | Admitting: *Deleted

## 2011-01-10 ENCOUNTER — Ambulatory Visit (INDEPENDENT_AMBULATORY_CARE_PROVIDER_SITE_OTHER): Payer: Medicare Other | Admitting: Internal Medicine

## 2011-01-10 DIAGNOSIS — I472 Ventricular tachycardia: Secondary | ICD-10-CM

## 2011-01-10 DIAGNOSIS — Z95810 Presence of automatic (implantable) cardiac defibrillator: Secondary | ICD-10-CM

## 2011-01-10 DIAGNOSIS — I509 Heart failure, unspecified: Secondary | ICD-10-CM

## 2011-01-10 DIAGNOSIS — I428 Other cardiomyopathies: Secondary | ICD-10-CM

## 2011-01-10 DIAGNOSIS — I4891 Unspecified atrial fibrillation: Secondary | ICD-10-CM

## 2011-01-10 LAB — ICD DEVICE OBSERVATION
AL AMPLITUDE: 1.1 mv
ATRIAL PACING ICD: 26.23 pct
BATTERY VOLTAGE: 2.61 V
FVT: 0
LV LEAD IMPEDENCE ICD: 584 Ohm
PACEART VT: 0
RV LEAD IMPEDENCE ICD: 480 Ohm
TOT-0006: 20080806000000
TZAT-0001ATACH: 3
TZAT-0002ATACH: NEGATIVE
TZAT-0002FASTVT: NEGATIVE
TZAT-0004SLOWVT: 8
TZAT-0005SLOWVT: 88 pct
TZAT-0005SLOWVT: 91 pct
TZAT-0012ATACH: 150 ms
TZAT-0012FASTVT: 200 ms
TZAT-0012SLOWVT: 200 ms
TZAT-0012SLOWVT: 200 ms
TZAT-0013SLOWVT: 3
TZAT-0018FASTVT: NEGATIVE
TZAT-0018SLOWVT: NEGATIVE
TZAT-0018SLOWVT: NEGATIVE
TZAT-0019ATACH: 6 V
TZAT-0019ATACH: 6 V
TZAT-0019FASTVT: 8 V
TZAT-0020ATACH: 1.5 ms
TZAT-0020ATACH: 1.5 ms
TZON-0003SLOWVT: 350 ms
TZST-0001ATACH: 5
TZST-0001FASTVT: 3
TZST-0001FASTVT: 4
TZST-0001FASTVT: 6
TZST-0001SLOWVT: 3
TZST-0001SLOWVT: 4
TZST-0002ATACH: NEGATIVE
TZST-0002FASTVT: NEGATIVE
TZST-0002FASTVT: NEGATIVE
TZST-0002FASTVT: NEGATIVE
TZST-0003SLOWVT: 35 J
VENTRICULAR PACING ICD: 98.04 pct

## 2011-01-10 NOTE — Assessment & Plan Note (Signed)
No documented evidence of ventricular tachycardia which I can find

## 2011-01-10 NOTE — Patient Instructions (Signed)
Your physician has recommended that you have a defibrillator generator change. Please see the instruction sheet given to you today for more information.     

## 2011-01-10 NOTE — Assessment & Plan Note (Signed)
Stable-class II

## 2011-01-10 NOTE — Assessment & Plan Note (Signed)
The patient has reached RRT with his device. We have discussed device generator replacement And potential risks including not limited to infection and lead fracture.. Given the long interval since reaching RRT, and my lack of immediate availability, Dr. Johney Frame has agreed to do this.  I would anticipate reprogramming the device DDDR with improved atrial sensitivity. His AV delay is considerably longer than I would've anticipated be necessary; however, His absence of symptoms suggests we should just leave it alone

## 2011-01-10 NOTE — Assessment & Plan Note (Signed)
He'll be maintained on amiodarone. We will discontinue his aspirin as it does not incrementally added benefit to warfarin

## 2011-01-10 NOTE — Progress Notes (Signed)
HPI: [name] [name] is a 77 y.o. male Seen at the request of [facility] cardiology for defibrillator management  He has a history of a nonischemic cardiomyopathy identified and confirmed by catheterization in 2008. Because of symptoms of heart failure and left bundle branch block he underwent CRT-D implantation with a Medtronic device and a 6947 defibrillator lead. He had a lazerus-like effect.  He currently has class II symptoms. He is able to climb stairs. He denies orthopnea nocturnal dyspnea peripheral edema. He has had no syncope. He has had a series of falls for which he has had a good explanation.  He also has a history of atrial for ablation. For this he was treated with amiodarone. About a year ago, there was an episode where his pulse and blood pressure could not be measured. It turned out as best as I can infer , that he was tracking atrial fibrillation.  His device was reprogrammed DDIR.  He has reached RRT April. Current Outpatient Prescriptions  Medication Sig Dispense Refill  . amiodarone (PACERONE) 200 MG tablet Take 200 mg by mouth daily.        Marland Kitchen aspirin 81 MG tablet Take 81 mg by mouth daily.        . carvedilol (COREG) 3.125 MG tablet Take 3.125 mg by mouth 2 (two) times daily with a meal.        . escitalopram (LEXAPRO) 10 MG tablet Take 10 mg by mouth daily.        . furosemide (LASIX) 40 MG tablet Take 40 mg by mouth. 1 qam and 1/2 qpm      . levothyroxine (SYNTHROID, LEVOTHROID) 125 MCG tablet Take 125 mcg by mouth daily.        . potassium chloride SA (K-DUR,KLOR-CON) 20 MEQ tablet Take 20 mEq by mouth daily.        . ramipril (ALTACE) 2.5 MG tablet Take 2.5 mg by mouth daily.        . simvastatin (ZOCOR) 10 MG tablet Take 10 mg by mouth at bedtime.        Marland Kitchen warfarin (COUMADIN) 5 MG tablet Take 5 mg by mouth daily.          No Known Allergies  Past Medical History  Diagnosis Date  . Nonischemic cardiomyopathy     ef 10 %  . BPH (benign prostatic hypertrophy)   .  Chronic heart failure     typically compensated  . Hypothyroidism   . Left bundle branch block   . Atrial fibrillation     fibrillation/flutter  . Depression   . Sleep apnea     Probable   . Hyperlipidemia     Past Surgical History  Procedure Date  . Transurethral resection of prostate     as circumcision in March of 2008  . Post cholecystectomy     No family history on file. Family history is notable for his mother having an enlarged heart  History   Social History  . Marital Status: Married    Spouse Name: N/A    Number of Children: N/A  . Years of Education: N/A   Occupational History  . Not on file.   Social History Main Topics  . Smoking status: Former Games developer  . Smokeless tobacco: Not on file  . Alcohol Use: Yes  . Drug Use: No  . Sexually Active:    Other Topics Concern  . Not on file   Social History Narrative  . No narrative on file  He is married. He has 4 children. He has 4 grandchildren. Does not use cigarettes alcohol or recreational drugs.  Fourteen point review of systems was negative except as noted in HPI and PMH  Except cataracts and arthritis particularly in his knees and bruisability related to his Coumadin PHYSICAL EXAMINATION  Blood pressure 112/65, pulse 68, resp. rate 16, height 6\' 3"  (1.905 m), weight 234 lb (106.142 kg).   Well developed and nourished older Caucasian male appearing his stated age in no acute distress HENT normal Neck supple with JVP-flat Carotids brisk and full without bruits Back without scoliosis or kyphosis Clear Regular rate and rhythm, no murmurs or gallops Device pocket well-healed Abd-soft with active BS without hepatomegaly or midline pulsation Femoral pulses 2+ distal pulses intact No Clubbing cyanosis edema Skin-warm and dry LN-neg submandibular and supraclavicular A & Oriented CN 3-12 normal  Grossly normal sensory and motor function Affect engaging .

## 2011-01-10 NOTE — Assessment & Plan Note (Addendum)
Continue current medications  I have reviewed with the patient also the potential benefits of genetic investigation for the lamin gene;  He will review this with the family.

## 2011-01-11 LAB — CBC WITH DIFFERENTIAL/PLATELET
Basophils Relative: 0.1 % (ref 0.0–3.0)
Eosinophils Relative: 3.9 % (ref 0.0–5.0)
Lymphocytes Relative: 16.8 % (ref 12.0–46.0)
Monocytes Absolute: 0.5 10*3/uL (ref 0.1–1.0)
Monocytes Relative: 9.1 % (ref 3.0–12.0)
Platelets: 120 10*3/uL — ABNORMAL LOW (ref 150.0–400.0)
RBC: 4.23 Mil/uL (ref 4.22–5.81)
RDW: 14.7 % — ABNORMAL HIGH (ref 11.5–14.6)

## 2011-01-11 LAB — BASIC METABOLIC PANEL
BUN: 21 mg/dL (ref 6–23)
Calcium: 9 mg/dL (ref 8.4–10.5)
Chloride: 104 mEq/L (ref 96–112)
Creatinine, Ser: 1.3 mg/dL (ref 0.4–1.5)
GFR: 55.47 mL/min — ABNORMAL LOW (ref >60.00)
Potassium: 3.8 mEq/L (ref 3.5–5.1)
Sodium: 140 mEq/L (ref 135–145)

## 2011-01-11 LAB — PROTIME-INR: Prothrombin Time: 22.9 s — ABNORMAL HIGH (ref 10.2–12.4)

## 2011-01-12 ENCOUNTER — Encounter: Payer: Medicare Other | Admitting: Internal Medicine

## 2011-01-16 ENCOUNTER — Ambulatory Visit (HOSPITAL_COMMUNITY)
Admission: RE | Admit: 2011-01-16 | Discharge: 2011-01-16 | Disposition: A | Discharge status: Home / Self Care | Payer: Medicare Other | Source: Ambulatory Visit | Attending: Internal Medicine | Admitting: Internal Medicine

## 2011-01-16 DIAGNOSIS — I428 Other cardiomyopathies: Secondary | ICD-10-CM

## 2011-01-16 DIAGNOSIS — I4891 Unspecified atrial fibrillation: Secondary | ICD-10-CM | POA: Insufficient documentation

## 2011-01-16 DIAGNOSIS — I509 Heart failure, unspecified: Secondary | ICD-10-CM | POA: Insufficient documentation

## 2011-01-16 DIAGNOSIS — I447 Left bundle-branch block, unspecified: Secondary | ICD-10-CM | POA: Insufficient documentation

## 2011-01-16 DIAGNOSIS — Z4502 Encounter for adjustment and management of automatic implantable cardiac defibrillator: Secondary | ICD-10-CM | POA: Insufficient documentation

## 2011-01-16 DIAGNOSIS — I5022 Chronic systolic (congestive) heart failure: Secondary | ICD-10-CM | POA: Insufficient documentation

## 2011-01-16 LAB — SURGICAL PCR SCREEN: MRSA, PCR: POSITIVE — AB

## 2011-01-16 LAB — APTT: aPTT: 31 seconds (ref 24–37)

## 2011-01-25 ENCOUNTER — Ambulatory Visit (INDEPENDENT_AMBULATORY_CARE_PROVIDER_SITE_OTHER): Payer: Medicare Other | Admitting: *Deleted

## 2011-01-25 ENCOUNTER — Encounter: Payer: Self-pay | Admitting: Internal Medicine

## 2011-01-25 DIAGNOSIS — I472 Ventricular tachycardia: Secondary | ICD-10-CM

## 2011-01-25 DIAGNOSIS — I428 Other cardiomyopathies: Secondary | ICD-10-CM

## 2011-01-25 DIAGNOSIS — Z95810 Presence of automatic (implantable) cardiac defibrillator: Secondary | ICD-10-CM

## 2011-01-25 DIAGNOSIS — I509 Heart failure, unspecified: Secondary | ICD-10-CM

## 2011-01-25 LAB — ICD DEVICE OBSERVATION
AL AMPLITUDE: 1.8 mv
AL IMPEDENCE ICD: 456 Ohm
AL THRESHOLD: 0.75 v
BAMS-0001: 171 {beats}/min
BATTERY VOLTAGE: 3.22 v
LV LEAD IMPEDENCE ICD: 570 Ohm
LV LEAD THRESHOLD: 0.75 v
RV LEAD AMPLITUDE: 14.8 mv
RV LEAD IMPEDENCE ICD: 437 Ohm
RV LEAD THRESHOLD: 1 v

## 2011-01-25 NOTE — Progress Notes (Signed)
icd wound check

## 2011-04-09 LAB — BASIC METABOLIC PANEL
BUN: 30 — ABNORMAL HIGH
CO2: 32
Calcium: 8.7
Chloride: 106
Chloride: 100
GFR calc Af Amer: >60
GFR calc Af Amer: >60
GFR calc Af Amer: >60
GFR calc non Af Amer: 51 — ABNORMAL LOW
GFR calc non Af Amer: 57 — ABNORMAL LOW
Potassium: 4.7
Potassium: 3.6
Potassium: 3.7
Sodium: 140
Sodium: 139

## 2011-04-09 LAB — COMPREHENSIVE METABOLIC PANEL
AST: 34
Albumin: 3.9
Alkaline Phosphatase: 71
BUN: 18
CO2: 29
Chloride: 99
Creatinine, Ser: 1.29
GFR calc non Af Amer: 55 — ABNORMAL LOW
Potassium: 4.3
Total Bilirubin: 1.5 — ABNORMAL HIGH

## 2011-04-09 LAB — PROTIME-INR
INR: 2 — ABNORMAL HIGH
Prothrombin Time: 21.7 — ABNORMAL HIGH
Prothrombin Time: 23.4 — ABNORMAL HIGH
Prothrombin Time: 23.6 — ABNORMAL HIGH

## 2011-04-09 LAB — CBC
HCT: 39.3
MCV: 89.9
Platelets: 140 — ABNORMAL LOW
RBC: 4.37
WBC: 6.9

## 2011-04-09 LAB — B-NATRIURETIC PEPTIDE (CONVERTED LAB)
Pro B Natriuretic peptide (BNP): 632 — ABNORMAL HIGH
Pro B Natriuretic peptide (BNP): 903 — ABNORMAL HIGH
Pro B Natriuretic peptide (BNP): 936 — ABNORMAL HIGH

## 2011-04-09 LAB — APTT: aPTT: 35

## 2011-04-19 ENCOUNTER — Encounter: Payer: Self-pay | Admitting: Internal Medicine

## 2011-04-19 ENCOUNTER — Ambulatory Visit (INDEPENDENT_AMBULATORY_CARE_PROVIDER_SITE_OTHER): Payer: Medicare Other | Admitting: Internal Medicine

## 2011-04-19 DIAGNOSIS — I428 Other cardiomyopathies: Secondary | ICD-10-CM

## 2011-04-19 DIAGNOSIS — I509 Heart failure, unspecified: Secondary | ICD-10-CM

## 2011-04-19 DIAGNOSIS — I4891 Unspecified atrial fibrillation: Secondary | ICD-10-CM

## 2011-04-19 DIAGNOSIS — Z95810 Presence of automatic (implantable) cardiac defibrillator: Secondary | ICD-10-CM

## 2011-04-19 LAB — ICD DEVICE OBSERVATION
AL AMPLITUDE: 1.5 mv
AL IMPEDENCE ICD: 437 Ohm
ATRIAL PACING ICD: 97.45 pct
CHARGE TIME: 1.431 s
FVT: 0
LV LEAD IMPEDENCE ICD: 589 Ohm
LV LEAD THRESHOLD: 0.75 V
RV LEAD IMPEDENCE ICD: 437 Ohm
TOT-0001: 0
TOT-0002: 0
TZAT-0001ATACH: 1
TZAT-0001ATACH: 2
TZAT-0001ATACH: 3
TZAT-0001SLOWVT: 2
TZAT-0002SLOWVT: NEGATIVE
TZAT-0012ATACH: 150 ms
TZAT-0012ATACH: 150 ms
TZAT-0018ATACH: NEGATIVE
TZAT-0018ATACH: NEGATIVE
TZAT-0019SLOWVT: 8 V
TZAT-0020ATACH: 1.5 ms
TZAT-0020FASTVT: 1.5 ms
TZAT-0020SLOWVT: 1.5 ms
TZON-0003ATACH: 350 ms
TZON-0003SLOWVT: 360 ms
TZON-0004VSLOWVT: 28
TZON-0005SLOWVT: 12
TZST-0001ATACH: 4
TZST-0001ATACH: 6
TZST-0001FASTVT: 2
TZST-0001FASTVT: 3
TZST-0001FASTVT: 5
TZST-0001SLOWVT: 4
TZST-0001SLOWVT: 6
TZST-0002ATACH: NEGATIVE
TZST-0002FASTVT: NEGATIVE
TZST-0002SLOWVT: NEGATIVE
TZST-0002SLOWVT: NEGATIVE

## 2011-04-19 NOTE — Assessment & Plan Note (Signed)
The patient's device was interrogated.  The information was reviewed. No changes were made in the programming.    

## 2011-04-19 NOTE — Assessment & Plan Note (Signed)
As above.

## 2011-04-19 NOTE — Assessment & Plan Note (Signed)
Stable on current medication.  

## 2011-04-19 NOTE — Progress Notes (Signed)
  HPI  [name] [name] is a 77 y.o. male Seen in followup for an ICD implanted for primary prevention in the setting of nonischemic cardiomyopathy identified and confirmed by catheterization in 2008. Because of symptoms of heart failure and left bundle branch block he underwent CRT-D implantation with a Medtronic device and a 6947 defibrillator lead. He had a lazerus-like effect.   He currently has class II symptoms. He is able to climb stairs. He denies orthopnea nocturnal dyspnea peripheral edema. He has had no syncope.  He also has a history of atrial fibrillation For this he was treated with amiodarone. Surveillance laboratories have been followed by Dr. Mayford Knife   Past Medical History  Diagnosis Date  . Nonischemic cardiomyopathy     ef 10 %  . BPH (benign prostatic hypertrophy)   . Chronic heart failure     typically compensated  . Hypothyroidism   . Left bundle branch block   . Atrial fibrillation     fibrillation/flutter  . Depression   . Sleep apnea     Probable   . Hyperlipidemia     Past Surgical History  Procedure Date  . Transurethral resection of prostate     as circumcision in March of 2008  . Post cholecystectomy     Current Outpatient Prescriptions  Medication Sig Dispense Refill  . amiodarone (PACERONE) 200 MG tablet Take 200 mg by mouth daily.        Marland Kitchen aspirin 81 MG tablet Take 81 mg by mouth daily.        . carvedilol (COREG) 3.125 MG tablet Take 3.125 mg by mouth 2 (two) times daily with a meal.        . escitalopram (LEXAPRO) 10 MG tablet Take 10 mg by mouth daily.        . furosemide (LASIX) 40 MG tablet Take 20 mg by mouth 2 (two) times daily. 2 qam and 1 qpm      . levothyroxine (SYNTHROID, LEVOTHROID) 125 MCG tablet Take 125 mcg by mouth daily.        . potassium chloride SA (K-DUR,KLOR-CON) 20 MEQ tablet Take 20 mEq by mouth daily.        . ramipril (ALTACE) 2.5 MG tablet Take 2.5 mg by mouth daily.        . simvastatin (ZOCOR) 10 MG tablet Take 10  mg by mouth at bedtime.        Marland Kitchen warfarin (COUMADIN) 5 MG tablet Take 5 mg by mouth daily.          No Known Allergies  Review of Systems negative except from HPI and PMH  Physical Exam Well developed and well nourished in no acute distress HENT normal E scleral and icterus clear Neck Supple Clear to ausculation Regular rate and rhythm,  Soft with active bowel sounds No clubbing cyanosis and edema Alert and oriented, grossly normal motor and sensory function Skin Warm and Dry  Assessment and  Plan \

## 2011-04-19 NOTE — Assessment & Plan Note (Signed)
Infrequent atrial fibrillation. I again suggested that his aspirin be discontinued. Dr. Mayford Knife has chosen to continue it.

## 2011-04-19 NOTE — Patient Instructions (Signed)

## 2011-07-19 ENCOUNTER — Encounter: Payer: Medicare Other | Admitting: *Deleted

## 2011-07-23 ENCOUNTER — Encounter: Payer: Self-pay | Admitting: *Deleted

## 2011-07-25 ENCOUNTER — Encounter: Payer: Self-pay | Admitting: Internal Medicine

## 2011-07-25 ENCOUNTER — Ambulatory Visit (INDEPENDENT_AMBULATORY_CARE_PROVIDER_SITE_OTHER): Payer: Medicare Other | Admitting: *Deleted

## 2011-07-25 DIAGNOSIS — Z95810 Presence of automatic (implantable) cardiac defibrillator: Secondary | ICD-10-CM

## 2011-07-25 DIAGNOSIS — I509 Heart failure, unspecified: Secondary | ICD-10-CM

## 2011-07-25 DIAGNOSIS — I4891 Unspecified atrial fibrillation: Secondary | ICD-10-CM

## 2011-07-28 LAB — REMOTE ICD DEVICE
AL IMPEDENCE ICD: 456 Ohm
AL THRESHOLD: 0.625 V
BAMS-0001: 170 {beats}/min
BATTERY VOLTAGE: 3.1781 V
FVT: 0
LV LEAD THRESHOLD: 0.875 V
PACEART VT: 0
RV LEAD AMPLITUDE: 11.3 mv
RV LEAD THRESHOLD: 1 V
TZAT-0001ATACH: 2
TZAT-0001SLOWVT: 1
TZAT-0001SLOWVT: 2
TZAT-0002ATACH: NEGATIVE
TZAT-0002ATACH: NEGATIVE
TZAT-0012ATACH: 150 ms
TZAT-0012FASTVT: 200 ms
TZAT-0012SLOWVT: 200 ms
TZAT-0012SLOWVT: 200 ms
TZAT-0018ATACH: NEGATIVE
TZAT-0018ATACH: NEGATIVE
TZAT-0018FASTVT: NEGATIVE
TZAT-0018SLOWVT: NEGATIVE
TZAT-0018SLOWVT: NEGATIVE
TZAT-0019ATACH: 6 V
TZAT-0019ATACH: 6 V
TZAT-0019ATACH: 6 V
TZAT-0019FASTVT: 8 V
TZAT-0020ATACH: 1.5 ms
TZAT-0020ATACH: 1.5 ms
TZAT-0020SLOWVT: 1.5 ms
TZON-0003SLOWVT: 360 ms
TZON-0003VSLOWVT: 340 ms
TZON-0004SLOWVT: 16
TZON-0004VSLOWVT: 28
TZST-0001ATACH: 5
TZST-0001FASTVT: 2
TZST-0001FASTVT: 6
TZST-0001SLOWVT: 3
TZST-0001SLOWVT: 5
TZST-0002ATACH: NEGATIVE
TZST-0002FASTVT: NEGATIVE
TZST-0002FASTVT: NEGATIVE
TZST-0002FASTVT: NEGATIVE
TZST-0002SLOWVT: NEGATIVE
TZST-0002SLOWVT: NEGATIVE
VENTRICULAR PACING ICD: 99.94 pct
VF: 0

## 2011-08-01 NOTE — Progress Notes (Signed)
Remote defib check w/icm  

## 2011-08-13 ENCOUNTER — Telehealth: Payer: Self-pay | Admitting: Oncology

## 2011-08-13 NOTE — Telephone Encounter (Signed)
called pts home s/w wife barbar and scheduled appt for 02/28.  will fax over letter to Dr. Zachery Dauer with appt d/t

## 2011-08-14 ENCOUNTER — Encounter: Payer: Self-pay | Admitting: Oncology

## 2011-08-14 ENCOUNTER — Encounter: Payer: Self-pay | Admitting: *Deleted

## 2011-08-14 ENCOUNTER — Telehealth: Payer: Self-pay | Admitting: Oncology

## 2011-08-14 DIAGNOSIS — D696 Thrombocytopenia, unspecified: Secondary | ICD-10-CM | POA: Insufficient documentation

## 2011-08-14 NOTE — Telephone Encounter (Signed)
Referred by Dr. Camillo Flaming Dx- Mild Thrombocytopenia

## 2011-08-23 ENCOUNTER — Other Ambulatory Visit: Payer: Medicare Other | Admitting: Lab

## 2011-08-23 ENCOUNTER — Ambulatory Visit (HOSPITAL_BASED_OUTPATIENT_CLINIC_OR_DEPARTMENT_OTHER): Payer: Medicare Other | Admitting: Oncology

## 2011-08-23 ENCOUNTER — Encounter: Payer: Self-pay | Admitting: Oncology

## 2011-08-23 ENCOUNTER — Telehealth: Payer: Self-pay | Admitting: Oncology

## 2011-08-23 ENCOUNTER — Ambulatory Visit (HOSPITAL_BASED_OUTPATIENT_CLINIC_OR_DEPARTMENT_OTHER): Payer: Medicare Other

## 2011-08-23 DIAGNOSIS — D696 Thrombocytopenia, unspecified: Secondary | ICD-10-CM

## 2011-08-23 DIAGNOSIS — N289 Disorder of kidney and ureter, unspecified: Secondary | ICD-10-CM

## 2011-08-23 DIAGNOSIS — E039 Hypothyroidism, unspecified: Secondary | ICD-10-CM

## 2011-08-23 DIAGNOSIS — E785 Hyperlipidemia, unspecified: Secondary | ICD-10-CM

## 2011-08-23 LAB — CBC WITH DIFFERENTIAL/PLATELET
BASO%: 0.4 % (ref 0.0–2.0)
Eosinophils Absolute: 0.2 10*3/uL (ref 0.0–0.5)
HCT: 41.5 % (ref 38.4–49.9)
MCHC: 34.1 g/dL (ref 32.0–36.0)
MONO#: 0.5 10*3/uL (ref 0.1–0.9)
NEUT#: 3.5 10*3/uL (ref 1.5–6.5)
NEUT%: 69.3 % (ref 39.0–75.0)
WBC: 5 10*3/uL (ref 4.0–10.3)
lymph#: 0.9 10*3/uL (ref 0.9–3.3)

## 2011-08-23 LAB — MORPHOLOGY
PLT EST: DECREASED
RBC Comments: NORMAL

## 2011-08-23 NOTE — Progress Notes (Signed)
[facility] INITIAL HEMATOLOGY CONSULTATION  Referral MD:   Dr. Juluis Rainier, M.D.   Reason for Referral: thrombocytopenia.     HPI: [name] [name] is a 78 year-old man with history of hypothyroidism, afib on Coumadin, nonischemic cardiomyopathy, hyperlipidemia.  He is not aware of diagnosis of thrombocytopenia until about 7 months ago.  On 01/20/2011, WBC 5.7; Hgb 13.9; Plt 120.  He has been on observation per PCP with follow up CBC.  On 08/07/2011, WBC 4.8; Hgb 13.9; Plt 104.  Other work up showed normal CMET except for slightly elevated Cr at 1.47.  TSH was 3.35 on 06/12/2011. He denies history of cirrhosis, hepatitis.  Given slight worsening of his thrombocytopenia, he was kindly referred to the [facility] for evaluation.  [name] [name] presented to the clinic for the first time today by himself.  He reports mild fatigue and SOB, DOE on heavy exertion such as walking up a steep incline or multiple flight of stairs.  He has bilateral knee pain which he attributes to osteoarthritis.    Patient denies headache, visual changes, confusion, drenching night sweats, palpable lymph node swelling, mucositis, odynophagia, dysphagia, nausea vomiting, jaundice, chest pain, palpitation, productive cough, gum bleeding, epistaxis, hematemesis, hemoptysis, abdominal pain, abdominal swelling, early satiety, melena, hematochezia, hematuria, skin rash, spontaneous bleeding, joint swelling, joint pain, heat or cold intolerance, bowel bladder incontinence, back pain, focal motor weakness, paresthesia, depression, suicidal or homocidal ideation, feeling hopelessness.     Past Medical History  Diagnosis Date  . Nonischemic cardiomyopathy     ef 10 %  . BPH (benign prostatic hypertrophy)   . Hypothyroidism   . Left bundle branch block   . Atrial fibrillation     fibrillation/flutter  . Depression   . Sleep apnea     on CPAP.    Marland Kitchen Hyperlipidemia   . Thrombocytopenia   . Osteoarthritis    :    Past Surgical History  Procedure Date  . Transurethral resection of prostate     for BPH  . Cholecystectomy   . Colonscopy     reportedly negative per pt; around 2005.    :   CURRENT MEDS: Current Outpatient Prescriptions  Medication Sig Dispense Refill  . amiodarone (PACERONE) 200 MG tablet Take 200 mg by mouth daily.        Marland Kitchen aspirin 81 MG tablet Take 81 mg by mouth daily.        . carvedilol (COREG) 3.125 MG tablet Take 3.125 mg by mouth 2 (two) times daily with a meal.        . furosemide (LASIX) 40 MG tablet Take 20 mg by mouth 2 (two) times daily. 2 qam and 1 qpm      . levothyroxine (SYNTHROID, LEVOTHROID) 125 MCG tablet Take 125 mcg by mouth daily.        . potassium chloride SA (K-DUR,KLOR-CON) 20 MEQ tablet Take 20 mEq by mouth daily.        . ramipril (ALTACE) 2.5 MG tablet Take 2.5 mg by mouth daily.        . simvastatin (ZOCOR) 10 MG tablet Take 10 mg by mouth at bedtime.        Marland Kitchen warfarin (COUMADIN) 5 MG tablet Take 5 mg by mouth daily.            No Known Allergies:  Family History  Problem Relation Age of Onset  . Heart failure Mother   . Heart disease Father   . Cancer Maternal  Uncle     Unknown subtype  :  History   Social History  . Marital Status: Married    Spouse Name: N/A    Number of Children: 4  . Years of Education: N/A   Occupational History  .      retired Optometrist; now in Research officer, political party   Social History Main Topics  . Smoking status: Former Games developer  . Smokeless tobacco: Not on file  . Alcohol Use: Yes  . Drug Use: No  . Sexually Active:    Other Topics Concern  . Not on file   Social History Narrative  . No narrative on file    REVIEW OF SYSTEM:  The rest of the 14-point review of sytem was negative.   Exam: ECOG 0-1  General:  Mildly obese man in no acute distress.  Eyes:  no scleral icterus.  ENT:  There were no oropharyngeal lesions.  Neck was without thyromegaly.  Lymphatics:  Negative  cervical, supraclavicular or axillary adenopathy.  Respiratory: lungs were clear bilaterally without wheezing or crackles.  Cardiovascular:  Regular rate and rhythm, S1/S2, without murmur, rub or gallop.  There was no pedal edema.  GI:  abdomen was soft, flat, nontender, nondistended, without organomegaly.  Muscoloskeletal:  no spinal tenderness of palpation of vertebral spine.  Skin exam was without echymosis, petichae.  Neuro exam was nonfocal.  Patient was able to get on and off exam table without assistance.  Gait was normal.  Patient was alerted and oriented.  Attention was good.   Language was appropriate.  Mood was normal without depression.  Speech was not pressured.  Thought content was not tangential.    LABS:  WBC 5.0; Hgb 14.1; MCV 94; Plt 118.   Blood smear review:   I personally reviewed the patient's peripheral blood smear today.  There was isocytosis.  There was no peripheral blast.  There was no schistocytosis, spherocytosis, target cell, rouleaux formation, tear drop cell.  There was no giant platelets or platelet clumps.  There were rare atypical lymphocytes.  There was slight hypolobation and hypogranulation.    ASSESSMENT AND PLAN:   1.  Hypothyroidism:  Clinically and by lab euthyroid.  He is on Synthroid. 2.  Hyperlipidemia:  He is on simvastatin. 3.  Nonischemic congestive hear failure:  He is euvolumic on exam today.  He is on ASA, amiodarone, Lasix, Coreg, ramipril.  Given his severely depressed EF, he is s/p placement of defibrillator and is on Coumadin.  4.  HTN:  Well controlled on Coreg, Lasix, Ramipril.  5.  Slight renal insufficiency:   Most likely due to ACE-I, Lasix.  There is no evidence of dehydration on exam today.  6.  Thrombocytopenia:  Differential diagnosis:  Most likely due to medication induced (amiodarone, simvastatin) vs hypothyroidism.  Given slight renal insufficiency, myeloma is on the differential.  There is very low clinical suspicion for ITP  given mild thrombocytopenia.  He has no concern for autoimmune process.  I cannot rule out early meylodysplastic syndrome given his progressive thrombocytopenia and his age.    Work up:  Pending HIV, SPEP, serum free light chain, Vit B12.    Recommendation:   - continue amiodarone and simvastatin since his thrombocytopenia is still very mild.  I discussed with patient the low risk of bleeding with plt >30K.  - I will continue to monitor his CBC in 2 and 4 months here at the [facility].  I will see him in 4 months.  -  In the future, when his plt continues to worsen to lower than 80K or he develops pancytopenia, then a bone marrow biopsy will be considered.  At this time, with such slight thrombocytopenia, even if he were to be diagnosed with MDS with a diagnostic bone marrow biopsy, treatment recommendation would be observation anyway.    [name] [name] expressed informed understanding of my explanation.  He agreed to the stated plan.    The length of time of the face-to-face encounter was 45 minutes. More than 50% of time was spent counseling and coordination of care.

## 2011-08-23 NOTE — Telephone Encounter (Signed)
appts made and printed for 4/6/9 2013  aom

## 2011-08-27 LAB — PROTEIN ELECTROPHORESIS, SERUM
Alpha-2-Globulin: 10.5 % (ref 7.1–11.8)
Beta Globulin: 5.2 % (ref 4.7–7.2)
Gamma Globulin: 13.3 % (ref 11.1–18.8)

## 2011-08-27 LAB — HIV ANTIBODY (ROUTINE TESTING W REFLEX): HIV: NONREACTIVE

## 2011-10-22 ENCOUNTER — Telehealth: Payer: Self-pay | Admitting: *Deleted

## 2011-10-22 ENCOUNTER — Other Ambulatory Visit (HOSPITAL_BASED_OUTPATIENT_CLINIC_OR_DEPARTMENT_OTHER): Payer: Medicare Other | Admitting: Lab

## 2011-10-22 DIAGNOSIS — D696 Thrombocytopenia, unspecified: Secondary | ICD-10-CM

## 2011-10-22 LAB — CBC WITH DIFFERENTIAL/PLATELET
BASO%: 0.8 % (ref 0.0–2.0)
Basophils Absolute: 0 10*3/uL (ref 0.0–0.1)
Eosinophils Absolute: 0.3 10*3/uL (ref 0.0–0.5)
HCT: 41 % (ref 38.4–49.9)
HGB: 13.7 g/dL (ref 13.0–17.1)
LYMPH%: 14 % (ref 14.0–49.0)
MCHC: 33.4 g/dL (ref 32.0–36.0)
MONO#: 0.7 10*3/uL (ref 0.1–0.9)
NEUT#: 3.3 10*3/uL (ref 1.5–6.5)
NEUT%: 65.2 % (ref 39.0–75.0)
Platelets: 127 10*3/uL — ABNORMAL LOW (ref 140–400)
WBC: 5 10*3/uL (ref 4.0–10.3)
lymph#: 0.7 10*3/uL — ABNORMAL LOW (ref 0.9–3.3)

## 2011-10-22 NOTE — Telephone Encounter (Signed)
Message copied by Wende Mott on Mon Oct 22, 2011 11:30 AM ------      Message from: HA, Raliegh Ip T      Created: Mon Oct 22, 2011  9:07 AM       Please call pt.  His platelet count is stable. This is medication induced.  But thrombocytopenia is not too bad.  No change.  Continue observation.  Thanks.

## 2011-10-22 NOTE — Telephone Encounter (Signed)
Notified pt of Dr. Lodema Pilot note.  He verbalized understanding and plans to keep next lab appt in June as scheduled.

## 2011-10-25 ENCOUNTER — Encounter: Payer: Self-pay | Admitting: Internal Medicine

## 2011-10-25 ENCOUNTER — Ambulatory Visit (INDEPENDENT_AMBULATORY_CARE_PROVIDER_SITE_OTHER): Payer: Medicare Other | Admitting: *Deleted

## 2011-10-25 DIAGNOSIS — I428 Other cardiomyopathies: Secondary | ICD-10-CM

## 2011-10-25 DIAGNOSIS — I509 Heart failure, unspecified: Secondary | ICD-10-CM

## 2011-10-25 DIAGNOSIS — I472 Ventricular tachycardia: Secondary | ICD-10-CM

## 2011-10-30 LAB — REMOTE ICD DEVICE
AL IMPEDENCE ICD: 399 Ohm
AL THRESHOLD: 0.625 V
BAMS-0001: 170 {beats}/min
BATTERY VOLTAGE: 3.1781 V
PACEART VT: 0
RV LEAD AMPLITUDE: 11.4 mv
RV LEAD THRESHOLD: 0.875 V
TOT-0002: 0
TOT-0006: 20120724000000
TZAT-0001FASTVT: 1
TZAT-0001SLOWVT: 1
TZAT-0002ATACH: NEGATIVE
TZAT-0002ATACH: NEGATIVE
TZAT-0002ATACH: NEGATIVE
TZAT-0002FASTVT: NEGATIVE
TZAT-0002SLOWVT: NEGATIVE
TZAT-0012FASTVT: 200 ms
TZAT-0012SLOWVT: 200 ms
TZAT-0012SLOWVT: 200 ms
TZAT-0018ATACH: NEGATIVE
TZAT-0018FASTVT: NEGATIVE
TZAT-0018SLOWVT: NEGATIVE
TZAT-0019ATACH: 6 V
TZAT-0019ATACH: 6 V
TZAT-0019ATACH: 6 V
TZAT-0019FASTVT: 8 V
TZAT-0019SLOWVT: 8 V
TZAT-0020ATACH: 1.5 ms
TZAT-0020SLOWVT: 1.5 ms
TZON-0003VSLOWVT: 340 ms
TZST-0001ATACH: 5
TZST-0001FASTVT: 4
TZST-0001FASTVT: 6
TZST-0001SLOWVT: 3
TZST-0001SLOWVT: 5
TZST-0002ATACH: NEGATIVE
TZST-0002ATACH: NEGATIVE
TZST-0002FASTVT: NEGATIVE
TZST-0002FASTVT: NEGATIVE
TZST-0002FASTVT: NEGATIVE
TZST-0002SLOWVT: NEGATIVE
VENTRICULAR PACING ICD: 99.96 pct
VF: 0

## 2011-11-02 NOTE — Progress Notes (Signed)
ICD remote with ICM 

## 2011-11-14 ENCOUNTER — Encounter: Payer: Self-pay | Admitting: *Deleted

## 2011-11-20 ENCOUNTER — Telehealth: Payer: Self-pay | Admitting: Internal Medicine

## 2011-11-20 NOTE — Telephone Encounter (Signed)
Patient request return call  From device tech regarding transmission, he can be reached at [phone]

## 2011-11-21 NOTE — Telephone Encounter (Signed)
Patient called questioning if he could [place] airport.  Advised to show device ID card and be wanded.  Pt aware and agrees.

## 2011-12-21 ENCOUNTER — Other Ambulatory Visit (HOSPITAL_BASED_OUTPATIENT_CLINIC_OR_DEPARTMENT_OTHER): Payer: Medicare Other | Admitting: Lab

## 2011-12-21 ENCOUNTER — Telehealth: Payer: Self-pay | Admitting: Oncology

## 2011-12-21 DIAGNOSIS — D696 Thrombocytopenia, unspecified: Secondary | ICD-10-CM

## 2011-12-21 LAB — CBC WITH DIFFERENTIAL/PLATELET
BASO%: 0.9 % (ref 0.0–2.0)
MCHC: 33.6 g/dL (ref 32.0–36.0)
MONO#: 0.4 10*3/uL (ref 0.1–0.9)
RBC: 4.31 10*6/uL (ref 4.20–5.82)
WBC: 4.6 10*3/uL (ref 4.0–10.3)
lymph#: 0.8 10*3/uL — ABNORMAL LOW (ref 0.9–3.3)

## 2011-12-21 NOTE — Telephone Encounter (Addendum)
Spoke with patient. Notified of labs per Dr Gaylyn Rong. See below.   Message copied by Myrtis Ser on Fri Dec 21, 2011  3:24 PM ------      Message from: Jethro Bolus T      Created: Fri Dec 21, 2011 12:43 PM       Please call pt.  He still has thrombocytopenia (most likely from medication-induced).  However, it's still very mild.  No indication for further work up or therapy at that time.  Thanks.

## 2011-12-24 ENCOUNTER — Telehealth: Payer: Self-pay

## 2011-12-24 NOTE — Telephone Encounter (Signed)
Message copied by Kallie Locks on Mon Dec 24, 2011  4:47 PM ------      Message from: Jethro Bolus T      Created: Fri Dec 21, 2011 12:43 PM       Please call pt.  He still has thrombocytopenia (most likely from medication-induced).  However, it's still very mild.  No indication for further work up or therapy at that time.  Thanks.

## 2012-01-30 ENCOUNTER — Telehealth: Payer: Self-pay | Admitting: *Deleted

## 2012-01-30 NOTE — Telephone Encounter (Signed)
Wife called to get pt's schedule.  Confirmed appt on 9/16 at 8 am for lab and to see Dr. Gaylyn Rong.

## 2012-01-31 ENCOUNTER — Encounter: Payer: Self-pay | Admitting: Internal Medicine

## 2012-01-31 ENCOUNTER — Ambulatory Visit (INDEPENDENT_AMBULATORY_CARE_PROVIDER_SITE_OTHER): Payer: Medicare Other | Admitting: *Deleted

## 2012-01-31 DIAGNOSIS — I509 Heart failure, unspecified: Secondary | ICD-10-CM

## 2012-01-31 DIAGNOSIS — Z95810 Presence of automatic (implantable) cardiac defibrillator: Secondary | ICD-10-CM

## 2012-02-03 LAB — REMOTE ICD DEVICE
AL AMPLITUDE: 1 mv
AL THRESHOLD: 0.625 V
ATRIAL PACING ICD: 94.67 pct
BATTERY VOLTAGE: 3.1509 V
LV LEAD IMPEDENCE ICD: 589 Ohm
RV LEAD IMPEDENCE ICD: 437 Ohm
RV LEAD THRESHOLD: 1 V
TOT-0006: 20120724000000
TZAT-0001ATACH: 3
TZAT-0001FASTVT: 1
TZAT-0001SLOWVT: 1
TZAT-0001SLOWVT: 2
TZAT-0002ATACH: NEGATIVE
TZAT-0002FASTVT: NEGATIVE
TZAT-0002SLOWVT: NEGATIVE
TZAT-0002SLOWVT: NEGATIVE
TZAT-0012ATACH: 150 ms
TZAT-0012ATACH: 150 ms
TZAT-0012ATACH: 150 ms
TZAT-0012SLOWVT: 200 ms
TZAT-0018SLOWVT: NEGATIVE
TZAT-0019ATACH: 6 V
TZAT-0019ATACH: 6 V
TZAT-0019FASTVT: 8 V
TZAT-0019SLOWVT: 8 V
TZAT-0020ATACH: 1.5 ms
TZAT-0020ATACH: 1.5 ms
TZAT-0020ATACH: 1.5 ms
TZON-0003VSLOWVT: 340 ms
TZON-0005SLOWVT: 12
TZST-0001ATACH: 4
TZST-0001ATACH: 5
TZST-0001ATACH: 6
TZST-0001FASTVT: 3
TZST-0001FASTVT: 4
TZST-0001FASTVT: 5
TZST-0001SLOWVT: 5
TZST-0001SLOWVT: 6
TZST-0002ATACH: NEGATIVE
TZST-0002FASTVT: NEGATIVE
TZST-0002FASTVT: NEGATIVE
TZST-0002FASTVT: NEGATIVE
TZST-0002SLOWVT: NEGATIVE
VF: 0

## 2012-02-05 ENCOUNTER — Telehealth: Payer: Self-pay | Admitting: Internal Medicine

## 2012-02-05 NOTE — Telephone Encounter (Signed)
New msg Pt called to say he missed transmission. Please call

## 2012-02-05 NOTE — Telephone Encounter (Signed)
Transmission received, patient aware. 

## 2012-02-07 ENCOUNTER — Encounter: Payer: Self-pay | Admitting: *Deleted

## 2012-02-19 ENCOUNTER — Encounter (HOSPITAL_COMMUNITY): Payer: Self-pay | Admitting: Emergency Medicine

## 2012-02-19 ENCOUNTER — Emergency Department (HOSPITAL_COMMUNITY): Payer: Medicare Other

## 2012-02-19 ENCOUNTER — Emergency Department (HOSPITAL_COMMUNITY)
Admission: EM | Admit: 2012-02-19 | Discharge: 2012-02-19 | Disposition: A | Discharge status: Home / Self Care | Payer: Medicare Other | Attending: Emergency Medicine | Admitting: Emergency Medicine

## 2012-02-19 DIAGNOSIS — I4891 Unspecified atrial fibrillation: Secondary | ICD-10-CM | POA: Insufficient documentation

## 2012-02-19 DIAGNOSIS — F3289 Other specified depressive episodes: Secondary | ICD-10-CM | POA: Insufficient documentation

## 2012-02-19 DIAGNOSIS — L0390 Cellulitis, unspecified: Secondary | ICD-10-CM

## 2012-02-19 DIAGNOSIS — L0291 Cutaneous abscess, unspecified: Secondary | ICD-10-CM

## 2012-02-19 DIAGNOSIS — E785 Hyperlipidemia, unspecified: Secondary | ICD-10-CM | POA: Insufficient documentation

## 2012-02-19 DIAGNOSIS — N400 Enlarged prostate without lower urinary tract symptoms: Secondary | ICD-10-CM | POA: Insufficient documentation

## 2012-02-19 DIAGNOSIS — I428 Other cardiomyopathies: Secondary | ICD-10-CM | POA: Insufficient documentation

## 2012-02-19 DIAGNOSIS — E039 Hypothyroidism, unspecified: Secondary | ICD-10-CM | POA: Insufficient documentation

## 2012-02-19 DIAGNOSIS — L02419 Cutaneous abscess of limb, unspecified: Secondary | ICD-10-CM | POA: Insufficient documentation

## 2012-02-19 DIAGNOSIS — G4730 Sleep apnea, unspecified: Secondary | ICD-10-CM | POA: Insufficient documentation

## 2012-02-19 DIAGNOSIS — Z7901 Long term (current) use of anticoagulants: Secondary | ICD-10-CM | POA: Insufficient documentation

## 2012-02-19 DIAGNOSIS — M1990 Unspecified osteoarthritis, unspecified site: Secondary | ICD-10-CM | POA: Insufficient documentation

## 2012-02-19 DIAGNOSIS — L03119 Cellulitis of unspecified part of limb: Secondary | ICD-10-CM | POA: Insufficient documentation

## 2012-02-19 DIAGNOSIS — F329 Major depressive disorder, single episode, unspecified: Secondary | ICD-10-CM | POA: Insufficient documentation

## 2012-02-19 LAB — CBC WITH DIFFERENTIAL/PLATELET
Eosinophils Relative: 5 % (ref 0–5)
HCT: 39.1 % (ref 39.0–52.0)
Hemoglobin: 13 g/dL (ref 13.0–17.0)
Lymphocytes Relative: 16 % (ref 12–46)
Lymphs Abs: 0.9 10*3/uL (ref 0.7–4.0)
MCV: 92.2 fL (ref 78.0–100.0)
Monocytes Absolute: 0.6 10*3/uL (ref 0.1–1.0)
Monocytes Relative: 10 % (ref 3–12)
Neutro Abs: 3.7 10*3/uL (ref 1.7–7.7)
WBC: 5.5 10*3/uL (ref 4.0–10.5)

## 2012-02-19 LAB — BASIC METABOLIC PANEL
BUN: 13 mg/dL (ref 6–23)
CO2: 29 mEq/L (ref 19–32)
Chloride: 97 mEq/L (ref 96–112)
Creatinine, Ser: 1.16 mg/dL (ref 0.50–1.35)
Glucose, Bld: 129 mg/dL — ABNORMAL HIGH (ref 70–99)

## 2012-02-19 MED ORDER — CEPHALEXIN 500 MG PO CAPS: 500.0000 mg | ORAL_CAPSULE | Freq: Four times a day (QID) | ORAL | Status: AC

## 2012-02-19 MED ORDER — SULFAMETHOXAZOLE-TRIMETHOPRIM 800-160 MG PO TABS: 1.0000 | ORAL_TABLET | Freq: Two times a day (BID) | ORAL | Status: AC

## 2012-02-19 MED ORDER — SODIUM CHLORIDE 0.9 % IV SOLN
Freq: Once | INTRAVENOUS | Status: AC
  Administered 2012-02-19: 20:00:00 via INTRAVENOUS

## 2012-02-19 MED ORDER — VANCOMYCIN HCL 1000 MG IV SOLR
1500.0000 mg | Freq: Once | INTRAVENOUS | Status: AC
  Administered 2012-02-19: 1500 mg via INTRAVENOUS
  Filled 2012-02-19: qty 1500

## 2012-02-19 NOTE — ED Notes (Signed)
Pt hit shin of right leg on toilet seat 2 weeks ago, pt noted bruising and a "scratch" to leg afterwards, pt is on Coumadin.  On Sunday pt noticed redness and swelling to the site.  Pt had appt with primary care doctor [facility]) today and was instructed to come here by doctor.  Site appears red and swollen, measures 10 cm across, 18 cm down.  Site was outlined by PCP before arrival.  Pt denies any drainage or pain from the site.

## 2012-02-19 NOTE — ED Provider Notes (Signed)
History     CSN: [id number]  Arrival date & time 02/19/12  1446   First MD Initiated Contact with Patient 02/19/12 1755      Chief Complaint  Patient presents with  . Wound Infection    (Consider location/radiation/quality/duration/timing/severity/associated sxs/prior treatment) HPI Comments: Patient's presents with complaint of right leg redness and swelling for the past 3-4 days. Patient states that he initially scratched his leg on a toilet 2 weeks ago. Patient was seen by his primary care Dr. today and was told to go to the emergency department. Patient denies fever, chills, nausea, vomiting. Patient is on Coumadin for Afib. Onset gradual. Course constant. Nothing makes symptoms better or worse. Patient is ambulatory without difficulty.  Patient is a 78 y.o. male presenting with leg pain. The history is provided by the patient.  Leg Pain  The incident occurred more than 2 days ago. The incident occurred at home. The pain is present in the right leg. The quality of the pain is described as aching. The patient is experiencing no pain. Pertinent negatives include no numbness, no inability to bear weight, no loss of motion, no muscle weakness, no loss of sensation and no tingling.    Past Medical History  Diagnosis Date  . Nonischemic cardiomyopathy     ef 10 %  . BPH (benign prostatic hypertrophy)   . Hypothyroidism   . Left bundle branch block   . Atrial fibrillation     fibrillation/flutter  . Depression   . Sleep apnea     on CPAP.    Marland Kitchen Hyperlipidemia   . Thrombocytopenia   . Osteoarthritis     Past Surgical History  Procedure Date  . Transurethral resection of prostate     for BPH  . Cholecystectomy   . Colonscopy     reportedly negative per pt; around 2005.      Family History  Problem Relation Age of Onset  . Heart failure Mother   . Heart disease Father   . Cancer Maternal Uncle     Unknown subtype    History  Substance Use Topics  . Smoking status:  Never Smoker   . Smokeless tobacco: Not on file  . Alcohol Use: No      Review of Systems  Constitutional: Negative for fever.  HENT: Negative for sore throat and rhinorrhea.   Eyes: Negative for redness.  Respiratory: Negative for cough.   Gastrointestinal: Negative for nausea, vomiting, abdominal pain and diarrhea.  Genitourinary: Negative for dysuria.  Musculoskeletal: Negative for myalgias.  Skin: Positive for color change and rash.  Neurological: Negative for tingling, numbness and headaches.  Hematological: Negative for adenopathy.    Allergies  Review of patient's allergies indicates no known allergies.  Home Medications   Current Outpatient Rx  Name Route Sig Dispense Refill  . AMIODARONE HCL 200 MG PO TABS Oral Take 200 mg by mouth daily.     . ASPIRIN EC 81 MG PO TBEC Oral Take 81 mg by mouth daily.    Marland Kitchen CARVEDILOL 3.125 MG PO TABS Oral Take 3.125 mg by mouth 2 (two) times daily with a meal.      . FUROSEMIDE 20 MG PO TABS Oral Take 20-40 mg by mouth 2 (two) times daily. Take two 20 mg (40 mg total) in the morning and one tablet ( 20 mg) at night    . LEVOTHYROXINE SODIUM 125 MCG PO TABS Oral Take 125 mcg by mouth daily.      Marland Kitchen  POTASSIUM CHLORIDE CRYS ER 20 MEQ PO TBCR Oral Take 20 mEq by mouth daily.      Marland Kitchen RAMIPRIL 2.5 MG PO TABS Oral Take 2.5 mg by mouth daily.      Marland Kitchen SIMVASTATIN 10 MG PO TABS Oral Take 10 mg by mouth at bedtime.      . WARFARIN SODIUM 5 MG PO TABS Oral Take 2.5-5 mg by mouth daily. Take 1/2 tablet( 2.5 mg) on Monday and Friday, then take one tablet (5 mg total) on Sunday, Tuesday, Wednesday, Thursday ans Saturday      BP 130/77  Pulse 73  Temp 98.1 F (36.7 C) (Oral)  Resp 18  SpO2 100%  Physical Exam  Nursing note and vitals reviewed. Constitutional: He appears well-developed and well-nourished.  HENT:  Head: Normocephalic and atraumatic.  Eyes: Conjunctivae are normal. Right eye exhibits no discharge. Left eye exhibits no discharge.    Neck: Normal range of motion. Neck supple.  Cardiovascular: Normal rate, regular rhythm and normal heart sounds.   Pulses:      Dorsalis pedis pulses are 2+ on the right side, and 2+ on the left side.       Posterior tibial pulses are 2+ on the right side, and 2+ on the left side.  Pulmonary/Chest: Effort normal and breath sounds normal.  Abdominal: Soft. There is no tenderness.  Neurological: He is alert.  Skin: Skin is warm and dry. There is erythema.       Approximately 4 cm area of circular induration overlying right shin. There is surrounding erythema and warmth measuring approximately 18 x 10 cm. No tenderness to palpation.  Psychiatric: He has a normal mood and affect.    ED Course  Procedures (including critical care time)  Labs Reviewed - No data to display Dg Tibia/fibula Right  02/19/2012  *RADIOLOGY REPORT*  Clinical Data: Infected wound along the anterior mid to lower right tib-fib.  RIGHT TIBIA AND FIBULA - 2 VIEW  Comparison: None.  Findings: There is soft tissue swelling anterior to the mid diaphysis of the tibia.  No radiopaque foreign body, soft tissue gas collection or bony destructive change is identified. Degenerative disease about the knee is noted. There is no fracture.  IMPRESSION:  1.  Soft tissue swelling anterior mid lower leg without evidence of osteomyelitis. 2.  Osteoarthritis right knee.   Original Report Authenticated By: Bernadene Bell. D'ALESSIO, M.D.      1. Abscess   2. Cellulitis     6:09 PM Patient seen and examined.  Bedside ultrasound shows pocket of air/pus underlying.   Vital signs reviewed and are as follows: Filed Vitals:   02/19/12 1452  BP: 130/77  Pulse: 73  Temp: 98.1 F (36.7 C)  Resp: 18   Patient seen with Dr. Freida Busman. Will I&D. Patient states INR performed at PCP today and was 2.7.   INCISION AND DRAINAGE Performed by: Carolee Rota Consent: Verbal consent obtained. Risks and benefits: risks, benefits and alternatives were  discussed Type: abscess  Body area: R lower leg  Anesthesia: local infiltration  Local anesthetic: lidocaine 2% with epinephrine  Anesthetic total: 2 ml  Complexity: complex  Blunt dissection to break up loculations, full pocket explored without drainage.   Drainage: none  Drainage amount: none  Packing material: 1/4 in iodoform gauze  Patient tolerance: Patient tolerated the procedure well with no immediate complications.   Slow oozing after procedure complete. Pressure bandage applied by myself.   9:45 PM I&D site is hemostatic.  The patient was urged to return to the Emergency Department urgently with worsening pain, swelling, expanding erythema especially if it streaks away from the affected area, fever, or if they have any other concerns.   The patient was urged to return to the Emergency Department or go to their PCP in 48 hours for packing removal and wound recheck.   The patient verbalized understanding and stated agreement with this plan.   MDM  Abscess (dry I&D) with cellulitis. No DM, no systemic symptoms. Patient has close PCP follow-up and is appropriate for outpatient treatment. Close follow-up/return instructions given.         Renne Crigler, [place] 02/19/12 2147

## 2012-02-19 NOTE — ED Provider Notes (Signed)
Medical screening examination/treatment/procedure(s) were conducted as a shared visit with non-physician practitioner(s) and myself.  I personally evaluated the patient during the encounter  Patient's right lower extremity was examined and with abscess. Will be drained by mid-level provider  Toy Baker, MD 02/19/12 [phone]

## 2012-02-19 NOTE — ED Provider Notes (Signed)
Medical screening examination/treatment/procedure(s) were conducted as a shared visit with non-physician practitioner(s) and myself.  I personally evaluated the patient during the encounter  Toy Baker, MD 02/19/12 2345

## 2012-03-08 ENCOUNTER — Other Ambulatory Visit: Payer: Self-pay | Admitting: Oncology

## 2012-03-08 DIAGNOSIS — D696 Thrombocytopenia, unspecified: Secondary | ICD-10-CM

## 2012-03-09 NOTE — Patient Instructions (Addendum)
CBC    Component Value Date/Time   WBC 4.9 03/10/2012 0827   WBC 5.5 02/19/2012 1834   RBC 4.35 03/10/2012 0827   RBC 4.24 02/19/2012 1834   HGB 13.9 03/10/2012 0827   HGB 13.0 02/19/2012 1834   HCT 40.8 03/10/2012 0827   HCT 39.1 02/19/2012 1834   PLT 119* 03/10/2012 0827   PLT 156 02/19/2012 1834   MCV 93.7 03/10/2012 0827   MCV 92.2 02/19/2012 1834   MCH 31.9 03/10/2012 0827   MCH 30.7 02/19/2012 1834   MCHC 34.1 03/10/2012 0827   MCHC 33.2 02/19/2012 1834   RDW 14.5 03/10/2012 0827   RDW 14.0 02/19/2012 1834   LYMPHSABS 0.8* 03/10/2012 0827   LYMPHSABS 0.9 02/19/2012 1834   MONOABS 0.5 03/10/2012 0827   MONOABS 0.6 02/19/2012 1834   EOSABS 0.3 03/10/2012 0827   EOSABS 0.3 02/19/2012 1834   BASOSABS 0.1 03/10/2012 0827   BASOSABS 0.0 02/19/2012 1834       1.  Diagnosis:  Sporadic, chronic thrombocytopenia (low platelet count) 2.  Cause:  Hypothyroid vs medication-induced (amiadarone, simvastatin...) 3.  Work up:  None needed at this time since the platelet count is still not that lower than chronic baseline.  4.  Follow up:  [facility] 4 and then 8 months.  Return visit in about 1 year.   In the future, if plt <100K persistently or also anemia or low WBC, then further work up maybe indicated.

## 2012-03-10 ENCOUNTER — Other Ambulatory Visit (HOSPITAL_BASED_OUTPATIENT_CLINIC_OR_DEPARTMENT_OTHER): Payer: Medicare Other

## 2012-03-10 ENCOUNTER — Telehealth: Payer: Self-pay | Admitting: Oncology

## 2012-03-10 ENCOUNTER — Ambulatory Visit (HOSPITAL_BASED_OUTPATIENT_CLINIC_OR_DEPARTMENT_OTHER): Payer: Medicare Other | Admitting: Oncology

## 2012-03-10 VITALS — BP 134/80 | HR 77 | Temp 97.0°F | Resp 20 | Ht 75.0 in | Wt 232.9 lb

## 2012-03-10 DIAGNOSIS — D696 Thrombocytopenia, unspecified: Secondary | ICD-10-CM

## 2012-03-10 DIAGNOSIS — I509 Heart failure, unspecified: Secondary | ICD-10-CM

## 2012-03-10 DIAGNOSIS — E039 Hypothyroidism, unspecified: Secondary | ICD-10-CM

## 2012-03-10 LAB — CBC WITH DIFFERENTIAL/PLATELET
BASO%: 1 % (ref 0.0–2.0)
HCT: 40.8 % (ref 38.4–49.9)
LYMPH%: 15.5 % (ref 14.0–49.0)
MCHC: 34.1 g/dL (ref 32.0–36.0)
MCV: 93.7 fL (ref 79.3–98.0)
MONO#: 0.5 10*3/uL (ref 0.1–0.9)
MONO%: 9.2 % (ref 0.0–14.0)
NEUT%: 67.7 % (ref 39.0–75.0)
Platelets: 119 10*3/uL — ABNORMAL LOW (ref 140–400)
WBC: 4.9 10*3/uL (ref 4.0–10.3)

## 2012-03-10 NOTE — Telephone Encounter (Signed)
Printed, made, and gave apt schedule to pt. Per Dr. Lodema Pilot orders.

## 2012-03-10 NOTE — Progress Notes (Signed)
[facility]  [phone] [phone]   OFFICE PROGRESS NOTE   Cc:  [name], MD  DIAGNOSIS: mild, chronic, thrombocytopenia most likely medication-induced.   CURRENT THERAPY: Watchful observation.  INTERVAL HISTORY: [name] [name] 78 y.o. male returns for regular followup by himself. He reports feeling well. Last month, he developed  cellulitis in the right lower extremity and was given antibiotic which increases INR. Since then, he has discontinued the antibiotic himself. The cellulitis area has improved with less pain, less erythema, and no discharge.  Patient denies fever, anorexia, weight loss, fatigue, headache, visual changes, confusion, drenching night sweats, palpable lymph node swelling, mucositis, odynophagia, dysphagia, nausea vomiting, jaundice, chest pain, palpitation, shortness of breath, dyspnea on exertion, productive cough, gum bleeding, epistaxis, hematemesis, hemoptysis, abdominal pain, abdominal swelling, early satiety, melena, hematochezia, hematuria, spontaneous bleeding, joint swelling, joint pain, heat or cold intolerance, bowel bladder incontinence, back pain, focal motor weakness, paresthesia, depression, suicidal or homicidal ideation, feeling hopelessness.  Past Medical History  Diagnosis Date  . Nonischemic cardiomyopathy     ef 10 %  . BPH (benign prostatic hypertrophy)   . Hypothyroidism   . Left bundle branch block   . Atrial fibrillation     fibrillation/flutter  . Depression   . Sleep apnea     on CPAP.    Marland Kitchen Hyperlipidemia   . Thrombocytopenia   . Osteoarthritis     Past Surgical History  Procedure Date  . Transurethral resection of prostate     for BPH  . Cholecystectomy   . Colonscopy     reportedly negative per pt; around 2005.      Current Outpatient Prescriptions  Medication Sig Dispense Refill  . amiodarone (PACERONE) 200 MG tablet Take 200 mg by mouth daily.       Marland Kitchen aspirin EC  81 MG tablet Take 81 mg by mouth daily.      . carvedilol (COREG) 3.125 MG tablet Take 3.125 mg by mouth 2 (two) times daily with a meal.        . furosemide (LASIX) 20 MG tablet Take 20-40 mg by mouth 2 (two) times daily. Take two 20 mg (40 mg total) in the morning and one tablet ( 20 mg) at night      . levothyroxine (SYNTHROID, LEVOTHROID) 125 MCG tablet Take 125 mcg by mouth daily.        . potassium chloride SA (K-DUR,KLOR-CON) 20 MEQ tablet Take 20 mEq by mouth daily.        . ramipril (ALTACE) 2.5 MG tablet Take 2.5 mg by mouth daily.        . simvastatin (ZOCOR) 10 MG tablet Take 10 mg by mouth at bedtime.        Marland Kitchen warfarin (COUMADIN) 5 MG tablet Take 2.5-5 mg by mouth daily. Take 1/2 tablet( 2.5 mg) on Monday and Friday, then take one tablet (5 mg total) on Sunday, Tuesday, Wednesday, Thursday ans Saturday        ALLERGIES:   has no known allergies.  REVIEW OF SYSTEMS:  The rest of the 14-point review of system was negative.   Filed Vitals:   03/10/12 0837  BP: 134/80  Pulse: 77  Temp: 97 F (36.1 C)  Resp: 20   Wt Readings from Last 3 Encounters:  03/10/12 232 lb 14.4 oz (105.643 kg)  08/23/11 233 lb 11.2 oz (106.006 kg)  04/19/11 235 lb 12.8 oz (106.958 kg)   ECOG Performance status: 0  PHYSICAL EXAMINATION:   General:  well-nourished man in no acute distress.  Eyes:  no scleral icterus.  ENT:  There were no oropharyngeal lesions.  Neck was without thyromegaly.  Lymphatics:  Negative cervical, supraclavicular or axillary adenopathy.  Respiratory: lungs were clear bilaterally without wheezing or crackles.  Cardiovascular:  Regular rate and rhythm, S1/S2, without murmur, rub or gallop.  There was no pedal edema.  GI:  abdomen was soft, flat, nontender, nondistended, without organomegaly.  Muscoloskeletal:  no spinal tenderness of palpation of vertebral spine.  Skin exam showed RLE area of slight increased in warmth with resolving erythema.  Neuro exam was nonfocal.  Patient  was able to get on and off exam table without assistance.  Gait was normal.  Patient was alerted and oriented.  Attention was good.   Language was appropriate.  Mood was normal without depression.  Speech was not pressured.  Thought content was not tangential.      LABORATORY/RADIOLOGY DATA:  Lab Results  Component Value Date   WBC 4.9 03/10/2012   HGB 13.9 03/10/2012   HCT 40.8 03/10/2012   PLT 119* 03/10/2012   GLUCOSE 129* 02/19/2012   ALKPHOS 71 01/09/2007   ALT 16 01/09/2007   AST 34 01/09/2007   NA 136 02/19/2012   K 4.3 02/19/2012   CL 97 02/19/2012   CREATININE 1.16 02/19/2012   BUN 13 02/19/2012   CO2 29 02/19/2012   INR 1.35 01/16/2011      ASSESSMENT AND PLAN:   1. Hypothyroidism:  He is on Synthroid per PCP.  2. Hyperlipidemia: He is on simvastatin.  3. Nonischemic congestive hear failure: He is euvolumic on exam today. He is on ASA, amiodarone, Lasix, Coreg, ramipril. Given his severely depressed EF, he is s/p placement of defibrillator and is on Coumadin.  4. HTN: Well controlled on Coreg, Lasix, Ramipril.  5. Slight renal insufficiency: Most likely due to ACE-I, Lasix. There is no evidence of dehydration on exam today.  6. Thrombocytopenia:  -Sporadic, chronic thrombocytopenia (low platelet count) -Cause:  Hypothyroid vs medication-induced (amiadarone, simvastatin...) -Work up:  None needed at this time since the platelet count is still not that lower than chronic baseline.  - Follow up:  [facility] in about 4 and then 8 months.  Return visit in about 1 year.   In the future, if plt <100K persistently or also anemia or low WBC, then further work up maybe indicated.    The length of time of the face-to-face encounter was 10 minutes. More than 50% of time was spent counseling and coordination of care.

## 2012-04-22 ENCOUNTER — Encounter: Payer: Self-pay | Admitting: Internal Medicine

## 2012-04-22 ENCOUNTER — Ambulatory Visit (INDEPENDENT_AMBULATORY_CARE_PROVIDER_SITE_OTHER): Payer: Medicare Other | Admitting: Internal Medicine

## 2012-04-22 VITALS — BP 112/74 | HR 66 | Ht 76.0 in | Wt 238.0 lb

## 2012-04-22 DIAGNOSIS — I4891 Unspecified atrial fibrillation: Secondary | ICD-10-CM

## 2012-04-22 DIAGNOSIS — I472 Ventricular tachycardia: Secondary | ICD-10-CM

## 2012-04-22 DIAGNOSIS — I428 Other cardiomyopathies: Secondary | ICD-10-CM

## 2012-04-22 DIAGNOSIS — I509 Heart failure, unspecified: Secondary | ICD-10-CM

## 2012-04-22 DIAGNOSIS — Z95810 Presence of automatic (implantable) cardiac defibrillator: Secondary | ICD-10-CM

## 2012-04-22 LAB — ICD DEVICE OBSERVATION
ATRIAL PACING ICD: 94.23 pct
BAMS-0001: 170 {beats}/min
CHARGE TIME: 8.458 s
FVT: 0
LV LEAD THRESHOLD: 0.875 V
PACEART VT: 0
RV LEAD IMPEDENCE ICD: 437 Ohm
TOT-0001: 0
TZAT-0001ATACH: 1
TZAT-0001ATACH: 2
TZAT-0002ATACH: NEGATIVE
TZAT-0012ATACH: 150 ms
TZAT-0012ATACH: 150 ms
TZAT-0018ATACH: NEGATIVE
TZAT-0018ATACH: NEGATIVE
TZAT-0019ATACH: 6 V
TZAT-0019SLOWVT: 8 V
TZAT-0020ATACH: 1.5 ms
TZAT-0020FASTVT: 1.5 ms
TZAT-0020SLOWVT: 1.5 ms
TZAT-0020SLOWVT: 1.5 ms
TZON-0003ATACH: 350 ms
TZON-0003SLOWVT: 360 ms
TZON-0004VSLOWVT: 28
TZON-0005SLOWVT: 12
TZST-0001ATACH: 4
TZST-0001FASTVT: 2
TZST-0001FASTVT: 3
TZST-0001SLOWVT: 4
TZST-0001SLOWVT: 6
TZST-0002ATACH: NEGATIVE
TZST-0002FASTVT: NEGATIVE
TZST-0002SLOWVT: NEGATIVE
TZST-0002SLOWVT: NEGATIVE

## 2012-04-22 NOTE — Patient Instructions (Signed)
Your physician wants you to follow-up in: 1 year with Dr Klein.  You will receive a reminder letter in the mail two months in advance. If you don't receive a letter, please call our office to schedule the follow-up appointment.  

## 2012-04-22 NOTE — Assessment & Plan Note (Signed)
The patient's device was interrogated.  The information was reviewed. No changes were made in the programming.    

## 2012-04-22 NOTE — Assessment & Plan Note (Signed)
Stable class II symptoms 

## 2012-04-22 NOTE — Progress Notes (Signed)
Patient Care Team: [name], MD as PCP - General (Family Medicine) Quintella Reichert, MD as Consulting Physician (Cardiology)   HPI  [name] [name] is a 78 y.o. male Seen in followup for an ICD implanted for primary prevention in the setting of nonischemic cardiomyopathy identified and confirmed by catheterization in 2008. Because of symptoms of heart failure and left bundle branch block he underwent CRT-D implantation with a [employer] device and a 6947 defibrillator lead. He had a lazerus-like effect.    He also has a history of atrial fibrillation For this he was treated with amiodarone.   He would like to continue to follow  His icd with [place].  He denies chest pain shortness of breath or peripheral edema. He has had some episodes where he has noted his heart rate fast with episodes of atrial fibrillation. He takes amiodarone which is followed by Dr. Mayford Knife. He is on warfarin   Past Medical History  Diagnosis Date  . Nonischemic cardiomyopathy     ef 10 %  . BPH (benign prostatic hypertrophy)   . Hypothyroidism   . Left bundle branch block   . Atrial fibrillation     fibrillation/flutter  . Depression   . Sleep apnea     on CPAP.    Marland Kitchen Hyperlipidemia   . Thrombocytopenia   . Osteoarthritis     Past Surgical History  Procedure Date  . Transurethral resection of prostate     for BPH  . Cholecystectomy   . Colonscopy     reportedly negative per pt; around 2005.      Current Outpatient Prescriptions  Medication Sig Dispense Refill  . amiodarone (PACERONE) 200 MG tablet Take 200 mg by mouth daily.       Marland Kitchen aspirin EC 81 MG tablet Take 81 mg by mouth daily.      . carvedilol (COREG) 3.125 MG tablet Take 3.125 mg by mouth 2 (two) times daily with a meal.        . furosemide (LASIX) 20 MG tablet Take 20-40 mg by mouth 2 (two) times daily. Take two 20 mg (40 mg total) in the morning and one tablet ( 20 mg) at night      . levothyroxine (SYNTHROID, LEVOTHROID) 125  MCG tablet Take 125 mcg by mouth daily.        . potassium chloride SA (K-DUR,KLOR-CON) 20 MEQ tablet Take 20 mEq by mouth daily.        . ramipril (ALTACE) 2.5 MG tablet Take 2.5 mg by mouth daily.        . simvastatin (ZOCOR) 10 MG tablet Take 10 mg by mouth at bedtime.        Marland Kitchen warfarin (COUMADIN) 5 MG tablet Take 2.5-5 mg by mouth daily. Take 1/2 tablet( 2.5 mg) on Monday and Friday, then take one tablet (5 mg total) on Sunday, Tuesday, Wednesday, Thursday ans Saturday        No Known Allergies  Review of Systems negative except from HPI and PMH  Physical Exam BP 112/74  Pulse 66  Ht 6\' 4"  (1.93 m)  Wt 238 lb (107.956 kg)  BMI 28.97 kg/m2 Well developed and well nourished in no acute distress HENT normal E scleral and icterus clear Neck Supple JVP flat; carotids brisk and full Clear to ausculation Device pocket well healed; without hematoma or erythema Regular rate and rhythm, no murmurs gallops or rub Soft with active bowel sounds No clubbing cyanosis no w edema Alert and  oriented, grossly normal motor and sensory function Skin Warm and Dry  Electrocardiogram demonstrates AV pacing  Assessment and  Plan

## 2012-04-22 NOTE — Assessment & Plan Note (Signed)
No intercurrent ventricular tachycardia. Continue amiodarone

## 2012-04-22 NOTE — Assessment & Plan Note (Signed)
Continue on guideline directed medical therapy. I would be inclined to have him stop his aspirin

## 2012-04-22 NOTE — Assessment & Plan Note (Signed)
He has infrequent episodes of atrial fibrillation which are associated with a rapid rate. I have suggested he could take an extra Corag at that time.

## 2012-07-28 ENCOUNTER — Encounter: Payer: Self-pay | Admitting: Internal Medicine

## 2012-07-28 ENCOUNTER — Ambulatory Visit (INDEPENDENT_AMBULATORY_CARE_PROVIDER_SITE_OTHER): Payer: Medicare Other | Admitting: *Deleted

## 2012-07-28 DIAGNOSIS — I509 Heart failure, unspecified: Secondary | ICD-10-CM

## 2012-07-28 DIAGNOSIS — Z95810 Presence of automatic (implantable) cardiac defibrillator: Secondary | ICD-10-CM

## 2012-07-28 DIAGNOSIS — I428 Other cardiomyopathies: Secondary | ICD-10-CM

## 2012-07-30 LAB — REMOTE ICD DEVICE
AL THRESHOLD: 0.625 V
BAMS-0001: 170 {beats}/min
BATTERY VOLTAGE: 3.1168 V
PACEART VT: 0
RV LEAD AMPLITUDE: 11.4 mv
RV LEAD THRESHOLD: 1 V
TOT-0006: 20120724000000
TZAT-0001FASTVT: 1
TZAT-0001SLOWVT: 1
TZAT-0001SLOWVT: 2
TZAT-0002ATACH: NEGATIVE
TZAT-0002ATACH: NEGATIVE
TZAT-0002ATACH: NEGATIVE
TZAT-0002FASTVT: NEGATIVE
TZAT-0002SLOWVT: NEGATIVE
TZAT-0012ATACH: 150 ms
TZAT-0012FASTVT: 200 ms
TZAT-0012SLOWVT: 200 ms
TZAT-0012SLOWVT: 200 ms
TZAT-0018ATACH: NEGATIVE
TZAT-0018FASTVT: NEGATIVE
TZAT-0018SLOWVT: NEGATIVE
TZAT-0018SLOWVT: NEGATIVE
TZAT-0019ATACH: 6 V
TZAT-0019ATACH: 6 V
TZAT-0019ATACH: 6 V
TZAT-0019FASTVT: 8 V
TZAT-0019SLOWVT: 8 V
TZAT-0020ATACH: 1.5 ms
TZAT-0020ATACH: 1.5 ms
TZAT-0020SLOWVT: 1.5 ms
TZON-0003VSLOWVT: 340 ms
TZON-0004SLOWVT: 24
TZST-0001ATACH: 5
TZST-0001FASTVT: 4
TZST-0001FASTVT: 5
TZST-0001FASTVT: 6
TZST-0001SLOWVT: 3
TZST-0001SLOWVT: 5
TZST-0002ATACH: NEGATIVE
TZST-0002ATACH: NEGATIVE
TZST-0002FASTVT: NEGATIVE
TZST-0002FASTVT: NEGATIVE
TZST-0002FASTVT: NEGATIVE
TZST-0002FASTVT: NEGATIVE
TZST-0002SLOWVT: NEGATIVE
TZST-0002SLOWVT: NEGATIVE
VENTRICULAR PACING ICD: 99.56 pct
VF: 0

## 2012-08-19 ENCOUNTER — Encounter: Payer: Self-pay | Admitting: *Deleted

## 2012-10-27 ENCOUNTER — Ambulatory Visit (INDEPENDENT_AMBULATORY_CARE_PROVIDER_SITE_OTHER): Payer: Medicare Other | Admitting: *Deleted

## 2012-10-27 ENCOUNTER — Encounter: Payer: Self-pay | Admitting: Internal Medicine

## 2012-10-27 DIAGNOSIS — I428 Other cardiomyopathies: Secondary | ICD-10-CM

## 2012-10-27 DIAGNOSIS — Z95810 Presence of automatic (implantable) cardiac defibrillator: Secondary | ICD-10-CM

## 2012-10-27 DIAGNOSIS — I509 Heart failure, unspecified: Secondary | ICD-10-CM

## 2012-11-07 ENCOUNTER — Other Ambulatory Visit (HOSPITAL_BASED_OUTPATIENT_CLINIC_OR_DEPARTMENT_OTHER): Payer: Medicare Other | Admitting: Lab

## 2012-11-07 ENCOUNTER — Encounter: Payer: Self-pay | Admitting: Oncology

## 2012-11-07 DIAGNOSIS — D696 Thrombocytopenia, unspecified: Secondary | ICD-10-CM

## 2012-11-07 LAB — CBC WITH DIFFERENTIAL/PLATELET
BASO%: 0.6 % (ref 0.0–2.0)
MCHC: 33.3 g/dL (ref 32.0–36.0)
MONO#: 0.4 10*3/uL (ref 0.1–0.9)
NEUT#: 4.3 10*3/uL (ref 1.5–6.5)
RBC: 4.35 10*6/uL (ref 4.20–5.82)
WBC: 5.5 10*3/uL (ref 4.0–10.3)
lymph#: 0.5 10*3/uL — ABNORMAL LOW (ref 0.9–3.3)

## 2012-11-10 LAB — REMOTE ICD DEVICE
AL AMPLITUDE: 0.6 mv
ATRIAL PACING ICD: 17.59 pct
BATTERY VOLTAGE: 3.1168 V
LV LEAD IMPEDENCE ICD: 513 Ohm
LV LEAD THRESHOLD: 0.75 V
RV LEAD IMPEDENCE ICD: 380 Ohm
TOT-0006: 20120724000000
TZAT-0001ATACH: 1
TZAT-0001ATACH: 2
TZAT-0001ATACH: 3
TZAT-0002ATACH: NEGATIVE
TZAT-0002SLOWVT: NEGATIVE
TZAT-0012ATACH: 150 ms
TZAT-0012FASTVT: 200 ms
TZAT-0012SLOWVT: 200 ms
TZAT-0018ATACH: NEGATIVE
TZAT-0018ATACH: NEGATIVE
TZAT-0018ATACH: NEGATIVE
TZAT-0018SLOWVT: NEGATIVE
TZAT-0019SLOWVT: 8 V
TZAT-0020ATACH: 1.5 ms
TZAT-0020ATACH: 1.5 ms
TZAT-0020FASTVT: 1.5 ms
TZAT-0020SLOWVT: 1.5 ms
TZON-0003VSLOWVT: 340 ms
TZON-0004VSLOWVT: 28
TZST-0001ATACH: 6
TZST-0001FASTVT: 4
TZST-0001FASTVT: 5
TZST-0001SLOWVT: 6
TZST-0002ATACH: NEGATIVE
TZST-0002ATACH: NEGATIVE
TZST-0002ATACH: NEGATIVE
TZST-0002FASTVT: NEGATIVE
TZST-0002FASTVT: NEGATIVE
TZST-0002SLOWVT: NEGATIVE
VF: 0

## 2012-12-02 ENCOUNTER — Encounter: Payer: Self-pay | Admitting: *Deleted

## 2012-12-08 ENCOUNTER — Emergency Department (HOSPITAL_COMMUNITY): Payer: Medicare Other

## 2012-12-08 ENCOUNTER — Emergency Department (HOSPITAL_COMMUNITY)
Admission: EM | Admit: 2012-12-08 | Discharge: 2012-12-08 | Disposition: A | Discharge status: Home / Self Care | Payer: Medicare Other | Attending: Emergency Medicine | Admitting: Emergency Medicine

## 2012-12-08 ENCOUNTER — Encounter (HOSPITAL_COMMUNITY): Payer: Self-pay | Admitting: Cardiology

## 2012-12-08 DIAGNOSIS — Z79899 Other long term (current) drug therapy: Secondary | ICD-10-CM | POA: Insufficient documentation

## 2012-12-08 DIAGNOSIS — G4730 Sleep apnea, unspecified: Secondary | ICD-10-CM | POA: Insufficient documentation

## 2012-12-08 DIAGNOSIS — I428 Other cardiomyopathies: Secondary | ICD-10-CM | POA: Insufficient documentation

## 2012-12-08 DIAGNOSIS — J811 Chronic pulmonary edema: Secondary | ICD-10-CM | POA: Insufficient documentation

## 2012-12-08 DIAGNOSIS — M7989 Other specified soft tissue disorders: Secondary | ICD-10-CM | POA: Insufficient documentation

## 2012-12-08 DIAGNOSIS — R0789 Other chest pain: Secondary | ICD-10-CM | POA: Insufficient documentation

## 2012-12-08 DIAGNOSIS — Z9981 Dependence on supplemental oxygen: Secondary | ICD-10-CM | POA: Insufficient documentation

## 2012-12-08 DIAGNOSIS — I4891 Unspecified atrial fibrillation: Secondary | ICD-10-CM | POA: Insufficient documentation

## 2012-12-08 DIAGNOSIS — R0609 Other forms of dyspnea: Secondary | ICD-10-CM | POA: Insufficient documentation

## 2012-12-08 DIAGNOSIS — I447 Left bundle-branch block, unspecified: Secondary | ICD-10-CM | POA: Insufficient documentation

## 2012-12-08 DIAGNOSIS — E785 Hyperlipidemia, unspecified: Secondary | ICD-10-CM | POA: Insufficient documentation

## 2012-12-08 DIAGNOSIS — I509 Heart failure, unspecified: Secondary | ICD-10-CM | POA: Insufficient documentation

## 2012-12-08 DIAGNOSIS — M1990 Unspecified osteoarthritis, unspecified site: Secondary | ICD-10-CM | POA: Insufficient documentation

## 2012-12-08 DIAGNOSIS — E039 Hypothyroidism, unspecified: Secondary | ICD-10-CM | POA: Insufficient documentation

## 2012-12-08 DIAGNOSIS — Z7982 Long term (current) use of aspirin: Secondary | ICD-10-CM | POA: Insufficient documentation

## 2012-12-08 DIAGNOSIS — Z8679 Personal history of other diseases of the circulatory system: Secondary | ICD-10-CM | POA: Insufficient documentation

## 2012-12-08 DIAGNOSIS — R5381 Other malaise: Secondary | ICD-10-CM | POA: Insufficient documentation

## 2012-12-08 DIAGNOSIS — R0989 Other specified symptoms and signs involving the circulatory and respiratory systems: Secondary | ICD-10-CM | POA: Insufficient documentation

## 2012-12-08 DIAGNOSIS — Z8659 Personal history of other mental and behavioral disorders: Secondary | ICD-10-CM | POA: Insufficient documentation

## 2012-12-08 DIAGNOSIS — Z7901 Long term (current) use of anticoagulants: Secondary | ICD-10-CM | POA: Insufficient documentation

## 2012-12-08 DIAGNOSIS — Z87448 Personal history of other diseases of urinary system: Secondary | ICD-10-CM | POA: Insufficient documentation

## 2012-12-08 LAB — BASIC METABOLIC PANEL
Calcium: 8.5 mg/dL (ref 8.4–10.5)
GFR calc Af Amer: 61 mL/min — ABNORMAL LOW (ref >90)
GFR calc non Af Amer: 53 mL/min — ABNORMAL LOW (ref >90)
Glucose, Bld: 105 mg/dL — ABNORMAL HIGH (ref 70–99)
Potassium: 4 mEq/L (ref 3.5–5.1)
Sodium: 137 mEq/L (ref 135–145)

## 2012-12-08 LAB — URINALYSIS, ROUTINE W REFLEX MICROSCOPIC
Bilirubin Urine: NEGATIVE
Hgb urine dipstick: NEGATIVE
Ketones, ur: NEGATIVE mg/dL
Nitrite: NEGATIVE
Protein, ur: NEGATIVE mg/dL
Urobilinogen, UA: 1 mg/dL (ref 0.0–1.0)

## 2012-12-08 LAB — CBC
Hemoglobin: 12.8 g/dL — ABNORMAL LOW (ref 13.0–17.0)
MCHC: 32.7 g/dL (ref 30.0–36.0)
Platelets: 129 10*3/uL — ABNORMAL LOW (ref 150–400)
RDW: 15.1 % (ref 11.5–15.5)

## 2012-12-08 LAB — PRO B NATRIURETIC PEPTIDE: Pro B Natriuretic peptide (BNP): 3648 pg/mL — ABNORMAL HIGH (ref 0–450)

## 2012-12-08 LAB — POCT I-STAT TROPONIN I

## 2012-12-08 LAB — PROTIME-INR
INR: 2.92 — ABNORMAL HIGH (ref 0.00–1.49)
Prothrombin Time: 29 seconds — ABNORMAL HIGH (ref 11.6–15.2)

## 2012-12-08 MED ORDER — FUROSEMIDE 10 MG/ML IJ SOLN
40.0000 mg | Freq: Once | INTRAMUSCULAR | Status: AC
  Administered 2012-12-08: 40 mg via INTRAVENOUS
  Filled 2012-12-08: qty 4

## 2012-12-08 NOTE — Consult Note (Signed)
Cardiology Consult Note  Admit date: 12/08/2012 Name: [name] [name] 79 y.o.  male DOB:  1932-06-14 MRN:  [id number]  Today's date:  12/08/2012  Referring Physician:    [facility]  Reason for Consultation:  Rapid heartbeat and shortness of breath  IMPRESSIONS: 1. Paroxysmal atrial fibrillation likely responsible for the elevated pulse that he presented with 2. Congestive heart failure worsened likely due to reduction diuretic dose recently 3. Nonischemic cardiomyopathy 4. Long-term anticoagulation with warfarin 5. Functioning implantable defibrillator  RECOMMENDATION: He may go home this evening. He does not wish to stay in the hospital. I would have him go back to taking his furosemide 80 mg in the morning and 40 in the evening. He should prescriptions fluids to 2 quarts daily. I would like him to followup with Dr. Mayford Knife either tomorrow or the next day. He may use an extra carvedilol if he develops episodes of tachycardia.  HISTORY: This 79 year old male has a history of a nonischemic cardiomyopathy and has a biventricular defibrillator implanted. He has had paroxysmal atrial fibrillation and is on low-dose amiodarone as well as warfarin. Recently because of low blood pressure Dr. Mayford Knife reduce his furosemide from 3 tablets daily to 2 tablets daily. He cut the grass this morning and had been feeling well and was checking his blood pressure this afternoon and noted an abnormal pulse rate reading on his blood pressure. He and his wife became concerned about this and checked it multiple times noting intermittent episodes of rapid pulse. He called Dr. Norris Cross office and was advised to come to the emergency room. He complained of dyspnea walking into the emergency room however this settled down settled down early after arrival here. Interrogation of his defibrillator showed multiple episodes of atrial fibrillation occurring over the past few days lasting around 3 minutes each time.  A BNP level was around 3000 which was elevated from previous ones in the chart and he was given a dose of furosemide. He is currently feeling better and is not complaining of chest pain. He does not wish to go into the hospital and wants to go home.  Past Medical History  Diagnosis Date  . Nonischemic cardiomyopathy     ef 10 %  . BPH (benign prostatic hypertrophy)   . Hypothyroidism   . Left bundle branch block   . Atrial fibrillation     fibrillation/flutter  . Depression   . Sleep apnea     on CPAP.    Marland Kitchen Hyperlipidemia   . Thrombocytopenia   . Osteoarthritis   . CHF (congestive heart failure)       Past Surgical History  Procedure Laterality Date  . Transurethral resection of prostate      for BPH  . Cholecystectomy    . Colonscopy      reportedly negative per pt; around 2005.       Allergies:  has No Known Allergies.   Medications: Prior to Admission medications   Medication Sig Start Date End Date Taking? Authorizing Provider  amiodarone (PACERONE) 200 MG tablet Take 200 mg by mouth at bedtime.    Yes Historical Provider, MD  aspirin EC 81 MG tablet Take 81 mg by mouth every morning.    Yes Historical Provider, MD  carvedilol (COREG) 3.125 MG tablet Take 3.125 mg by mouth 2 (two) times daily with a meal.     Yes Historical Provider, MD  furosemide (LASIX) 20 MG tablet Take 20 mg by mouth 2 (two) times daily.  Yes Historical Provider, MD  levothyroxine (SYNTHROID, LEVOTHROID) 125 MCG tablet Take 125 mcg by mouth daily before breakfast.    Yes Historical Provider, MD  potassium chloride SA (K-DUR,KLOR-CON) 20 MEQ tablet Take 20 mEq by mouth every morning.    Yes Historical Provider, MD  ramipril (ALTACE) 2.5 MG tablet Take 2.5 mg by mouth at bedtime.    Yes Historical Provider, MD  simvastatin (ZOCOR) 10 MG tablet Take 10 mg by mouth at bedtime.     Yes Historical Provider, MD  warfarin (COUMADIN) 5 MG tablet Take 2.5-5 mg by mouth every evening. Take 1/2 tablet( 2.5  mg) on Monday and Friday, then take one tablet (5 mg total) on all other days   Yes Historical Provider, MD    Family History: Family Status  Relation Status Death Age  . Mother Deceased   . Father Deceased   . Sister Alive   . Brother Alive   . Maternal Uncle Deceased   . Brother Alive     Social History:   reports that he has never smoked. He does not have any smokeless tobacco history on file. He reports that he does not drink alcohol or use illicit drugs.   History   Social History Narrative  . No narrative on file    Review of Systems: Drinks a lot of fluid and has mild dyspnea with exertion. He complains of nocturia and frequency.  Other than as noted above the remainder of the review of systems is unremarkable.  Physical Exam: BP 114/73  Pulse 67  Temp(Src) 98.3 F (36.8 C) (Oral)  Resp 17  SpO2 96%  General appearance: alert, cooperative, appears stated age and no distress Head: Normocephalic, without obvious abnormality, atraumatic Eyes: conjunctivae/corneas clear. PERRL, EOM's intact. Fundi not examined. Neck: no adenopathy, no carotid bruit, supple, symmetrical, trachea midline and Appears to have V waves of tricuspid regurgitation noted in the upright position Lungs: clear to auscultation bilaterally Heart: Regular rhythm, normal S1-S2, no S3 noted 1/6 systolic murmur Abdomen: soft, non-tender; bowel sounds normal; no masses,  no organomegaly Rectal: deferred Extremities: Trace to 1+ edema bilaterally right greater than left, changes of mild venous insufficiency Pulses: 2+ and symmetric Skin: Skin color, texture, turgor normal. No rashes or lesions Neurologic: Grossly normal  Labs: CBC  Recent Labs  12/08/12 1634  WBC 5.6  RBC 4.23  HGB 12.8*  HCT 39.1  PLT 129*  MCV 92.4  MCH 30.3  MCHC 32.7  RDW 15.1   CMP   Recent Labs  12/08/12 1634  NA 137  K 4.0  CL 101  CO2 24  GLUCOSE 105*  BUN 15  CREATININE 1.26  CALCIUM 8.5  GFRNONAA  53*  GFRAA 61*   BNP (last 3 results)  Recent Labs  12/08/12 1634  PROBNP 3648.0*   Cardiac Panel (last 3 results)    Radiology: Suspected mild pulmonary edema with trace effusions  EKG: Paced rhythm, sinus  Review of his [employer] interrogation report shows no significant Optivol activity. There is no ventricular tachycardia or ATP pacing. The activity appears to mostly be nonsustained atrial fibrillation.  Signed:  Darden Palmer MD [facility]   Cardiology Consultant  12/08/2012, 8:45 PM

## 2012-12-08 NOTE — ED Notes (Signed)
Pt ambulated without difficulty maintaining o2 stats of 93-94%. HR increased to 110 but decreased back to 70 at rest.

## 2012-12-08 NOTE — ED Notes (Addendum)
Pt reports this morning he started feeling bad and called his cardiologist. Reports that his HR was 130 at home. States hx of a-fib and takes amiodarone at home for the same. Denies any chest pain at this time. Reports that he does feel SOB with walking. No distress noted. 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

## 2012-12-08 NOTE — ED Notes (Signed)
Cardiology at bedside.

## 2012-12-08 NOTE — ED Provider Notes (Signed)
History     CSN: [id number]  Arrival date & time 12/08/12  1628   First MD Initiated Contact with Patient 12/08/12 1640      Chief Complaint  Patient presents with  . Irregular Heart Beat    (Consider location/radiation/quality/duration/timing/severity/associated sxs/prior treatment) HPI Pt with history of nonischemic cardiomyopathy and atrial fib with defib/pacer in plce. Followed by Dr Mayford Knife. Presents with 1 day of fatigue, DOE, and chest heaviness. States he took his pulse several times today and was in 130's. While sitting pt states he is feeling better. No chest pain currently. +mild bl lower ext swelling.  Past Medical History  Diagnosis Date  . Nonischemic cardiomyopathy     ef 10 %  . BPH (benign prostatic hypertrophy)   . Hypothyroidism   . Left bundle branch block   . Atrial fibrillation     fibrillation/flutter  . Depression   . Sleep apnea     on CPAP.    Marland Kitchen Hyperlipidemia   . Thrombocytopenia   . Osteoarthritis   . CHF (congestive heart failure)     Past Surgical History  Procedure Laterality Date  . Transurethral resection of prostate      for BPH  . Cholecystectomy    . Colonscopy      reportedly negative per pt; around 2005.      Family History  Problem Relation Age of Onset  . Heart failure Mother   . Heart disease Father   . Cancer Maternal Uncle     Unknown subtype    History  Substance Use Topics  . Smoking status: Never Smoker   . Smokeless tobacco: Not on file  . Alcohol Use: No      Review of Systems  Constitutional: Positive for fatigue. Negative for fever and chills.  HENT: Negative for neck pain.   Respiratory: Positive for shortness of breath. Negative for cough and wheezing.   Cardiovascular: Positive for chest pain and leg swelling. Negative for palpitations.  Gastrointestinal: Negative for nausea, vomiting, abdominal pain and diarrhea.  Genitourinary: Negative for dysuria and frequency.  Musculoskeletal: Negative for  myalgias and back pain.  Skin: Negative for rash and wound.  Neurological: Negative for dizziness, syncope, weakness, light-headedness, numbness and headaches.  All other systems reviewed and are negative.    Allergies  Review of patient's allergies indicates no known allergies.  Home Medications   Current Outpatient Rx  Name  Route  Sig  Dispense  Refill  . amiodarone (PACERONE) 200 MG tablet   Oral   Take 200 mg by mouth at bedtime.          Marland Kitchen aspirin EC 81 MG tablet   Oral   Take 81 mg by mouth every morning.          . carvedilol (COREG) 3.125 MG tablet   Oral   Take 3.125 mg by mouth 2 (two) times daily with a meal.           . furosemide (LASIX) 20 MG tablet   Oral   Take 20 mg by mouth 2 (two) times daily.          Marland Kitchen levothyroxine (SYNTHROID, LEVOTHROID) 125 MCG tablet   Oral   Take 125 mcg by mouth daily before breakfast.          . potassium chloride SA (K-DUR,KLOR-CON) 20 MEQ tablet   Oral   Take 20 mEq by mouth every morning.          . ramipril (  ALTACE) 2.5 MG tablet   Oral   Take 2.5 mg by mouth at bedtime.          . simvastatin (ZOCOR) 10 MG tablet   Oral   Take 10 mg by mouth at bedtime.           Marland Kitchen warfarin (COUMADIN) 5 MG tablet   Oral   Take 2.5-5 mg by mouth every evening. Take 1/2 tablet( 2.5 mg) on Monday and Friday, then take one tablet (5 mg total) on all other days           BP 114/73  Pulse 67  Temp(Src) 98.3 F (36.8 C) (Oral)  Resp 17  SpO2 96%  Physical Exam  Nursing note and vitals reviewed. Constitutional: He is oriented to person, place, and time. He appears well-developed and well-nourished. No distress.  HENT:  Head: Normocephalic and atraumatic.  Mouth/Throat: Oropharynx is clear and moist.  Eyes: EOM are normal. Pupils are equal, round, and reactive to light.  Neck: Normal range of motion. Neck supple.  Cardiovascular: Normal rate and regular rhythm.   Pulmonary/Chest: Effort normal and breath  sounds normal. No respiratory distress. He has no wheezes. He has no rales. He exhibits no tenderness.  Abdominal: Soft. Bowel sounds are normal. He exhibits no distension and no mass. There is no tenderness. There is no rebound.  Musculoskeletal: Normal range of motion. He exhibits edema (2 + bl lower edema). He exhibits no tenderness.  Neurological: He is alert and oriented to person, place, and time.  Moves all ext without deficit, sensation grossly intact.   Skin: Skin is warm and dry. No rash noted. No erythema.  Psychiatric: He has a normal mood and affect. His behavior is normal.    ED Course  Procedures (including critical care time)  Labs Reviewed  CBC - Abnormal; Notable for the following:    Hemoglobin 12.8 (*)    Platelets 129 (*)    All other components within normal limits  BASIC METABOLIC PANEL - Abnormal; Notable for the following:    Glucose, Bld 105 (*)    GFR calc non Af Amer 53 (*)    GFR calc Af Amer 61 (*)    All other components within normal limits  PRO B NATRIURETIC PEPTIDE - Abnormal; Notable for the following:    Pro B Natriuretic peptide (BNP) 3648.0 (*)    All other components within normal limits  PROTIME-INR - Abnormal; Notable for the following:    Prothrombin Time 29.0 (*)    INR 2.92 (*)    All other components within normal limits  URINALYSIS, ROUTINE W REFLEX MICROSCOPIC  POCT I-STAT TROPONIN I   Dg Chest 2 View  12/08/2012   *RADIOLOGY REPORT*  Clinical Data: Irregular heart beat, history of CHF  CHEST - 2 VIEW  Comparison: 06/28/2012  Findings: Grossly unchanged enlarged cardiac silhouette and mediastinal contours.  Stable positioning of support apparatus. The pulmonary vasculature is less distinct on the present examination with cephalization of flow. A small amount of fluid is seen within the right minor fissure.  Trace bilateral effusions are not excluded.  No definite pneumothorax.  Interval development of an indeterminate approximately 6 mm  nodule overlying the peripheral aspect of the right mid lung.  Grossly unchanged right infrahilar heterogeneous opacities.  No new focal airspace opacities.  IMPRESSION: 1.  Suspected development of mild pulmonary edema with possible trace bilateral effusions. 2.  Interval development of possible 6 mm nodule within the peripheral aspect  the right mid lung.  A follow-up chest radiograph in 4 to 6 weeks after treatment is recommended to ensure resolution.   Original Report Authenticated By: Tacey Ruiz, MD     1. Pulmonary edema      Date: 12/08/2012  Rate: 68  Rhythm: venticularly-paced rhythm   QRS Axis: indeterminate  Intervals: QRS prolonged  ST/T Wave abnormalities: indeterminate  Conduction Disutrbances:none  Narrative Interpretation:   Old EKG Reviewed: none available    MDM   Pt has been up to the bathroom several times to urinate. After IV lasix.  Discussed with Dr Donnie Aho who will see in ED.   Seen by cardiology and cleared to be d/c'd home and follow up with Dr Mayford Knife in 1-2 days.      Loren Racer, MD 12/08/12 2116

## 2012-12-08 NOTE — ED Notes (Signed)
Charge Nurse at bedside interrogate pacemaker.

## 2012-12-24 ENCOUNTER — Other Ambulatory Visit: Payer: Self-pay | Admitting: Cardiology

## 2012-12-24 NOTE — H&P (Signed)
Office Visit     Patient: [name], [name] [name] Account Number: [id number] Provider: Anara Cowman, MD  DOB: 02/18/1932 Age: 79 Y Sex: Male Date: 12/23/2012  Phone: [phone]   Address: [address], [place], [place]  Pcp: ELIZABETH BARNES          1. Per TT.        HPI:  General:  [name] [name] is a 79 yo male with a hx of CAD, PAF, OSA, and DCM with BIV-ICD with EF 15% who recently went to the ER with complaints of SOB and palpitations with HR 130bpm. By the time he had gotten to the ER his HR was normal. interrogation of his AICD showed multiple short bursts of PAF over the past week with RVR. He was found to have an elevated BNP and his lasix dose was uptitrated. He was seen by me last week and I increased his Lasix dose. His amio was also increased due to a low amio level and he is currently being loaded. He still has some DOE and LE edema. He weight is up a few pounds but optivol index is in the normal range. .        ROS:  See HPI, A twelve system review was perfomed at today's visit. For pertinent positives and negatives see HPI.       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 Maricarmen Braziel) .        Surgical History: status post biventricular AICD implantation 08/08, cholecystectomy 2000, TURP .        Family History: Father: deceased 67 yrs, demientia, died form pneumoniaMother: deceased 79 yrs, enlarged heart       Social History:  General:  History of smoking  cigarettes: Never smoked Alcohol: yes, occasionally.  no Recreational drug use.  Marital Status: married.  Children: 4 children.        Medications: Taking Simvastatin 10MG Tablet 1 tablet Once a day, Taking Levothyroxine Sodium 125MCG Tablet 1 tablet every morning, Taking Potassium Chloride 20 MEQ Tablet Extended Release 1 capsule once a day, Taking Aspirin 81 MG Tablet Delayed Release 1 tablet Once a day, Taking Coreg 3.125 MG Tablet 1 tablet with food Twice a day, Taking Altace 2.5 MG Capsule 1 capsule Once a day, Taking Warfarin Sodium 5 MG Tablet per pharmD 5 mg qd except 2.5 mg [name] (using 5 mg pills), Taking Furosemide 20MG Tablet 2 tablets BID, Taking Amiodarone HCl 200 MG Tablet tablet 200mg 2 tablets daily for 2 weeks then decrease to 1 and 1/2 tablets daily, Medication List reviewed and reconciled with the patient       Allergies: N.K.D.A.          Vitals: Wt 238, Wt change .8 lb, Ht 75.5, BMI 29.35, Pulse sitting 127, BP sitting 110/79.       Examination:  Cardiology, General:  GENERAL APPEARANCE: pleasant, NAD.  HEENT: unremarkable.  CAROTID UPSTROKE: normal, no bruit.  JVD: flat.  HEART SOUNDS: regular, normal S1, S2, no S3 or S4.  MURMUR: absent.  LUNGS: no rales or wheezes.  ABDOMEN: soft, non tender, positive bowel sounds, no masses felt.  EXTREMITIES: no leg edema.  PERIPHERAL PULSES: 2 plus bilateral.              Assessment:  1. Atrial fibrillation - 427.31 (Primary)  2. Coronary atherosclerosis of native coronary artery - 414.01  3. Essential hypertension, benign - 401.1  4. Other primary cardiomyopathies - 425.4  5. Left heart failure - 428.1  6. SOB (shortness of breath) - 786.05        1. Atrial fibrillation  Continue Warfarin Sodium Tablet, 5 MG, per pharmD, Orally, 5 mg qd except 2.5 mg [name] (using 5 mg pills) ; Continue Amiodarone HCl Tablet, 200 MG, tablet, Orally, 200mg 2 tablets daily for 2 weeks then decrease to 1 and 1/2 tablets daily .  LAB: PT (Prothrombin Time) (005199) LAB: CBC with Diff LAB: Basic Metabolic Imaging: EKG atrial flutter with ventricular tracking and pacing      Corson,Danielle 12/23/2012 11:34:50 AM > [name] [name] 12/23/2012 01:42:20 PM >    Notes: His AICD was changed to DDIR so as not to track the aflutter with tachycardia. I am going to check with his primary MD to see if he has been therapeutic with his INR and if so I will set him up for DCCV next Monday.       2. Essential hypertension, benign  Continue Altace Capsule, 2.5 MG, 1 capsule, Orally, Once a day .       3. Other primary cardiomyopathies  Continue Furosemide Tablet, 20MG, 2 tablets, by mouth, BID ; Continue Altace Capsule, 2.5 MG, 1 capsule, Orally, Once a day ; Continue Coreg Tablet, 3.125 MG, 1 tablet with food, by mouth, Twice a day .       4. Left heart failure  Continue Furosemide Tablet, 20MG, 2 tablets, by mouth, BID .        Procedures:  Venipuncture:  Venipuncture: Hall,Jechelle 12/23/2012 02:16:22 PM > , performed in left arm.           Labs:    Lab: PT (Prothrombin Time) (005199)  Prothrombin Time 35.8 H 9.1-12.0 - SEC  INR 3.4 H 0.8-1.2 -           Lab: CBC with Diff  WBC 5.9  4.0-11.0 - K/ul  RBC 4.33  4.20-5.80 - [name]/uL  HGB 13.0  13.0-17.0 - g/dL  HCT 40.1  39.0-52.0 - %  MCH 30.1  27.0-33.0 - pg  MPV 9.8  7.5-10.7 - fL  MCV 92.7  80.0-94.0 - fL  MCHC 32.5  32.0-36.0 - g/dL  RDW 15.7 H 11.5-15.5 - %  NRBC# 0.00  -   PLT 127 L 150-400 - K/uL  NEUT % 71.2  43.3-71.9 - %  NRBC% 0.10  - %  LYMPH% 14.4 L 16.8-43.5 - %  MONO % 10.6  4.6-12.4 - %  EOS % 2.8  0.0-7.8 - %  BASO % 1.0  0.0-1.0 - %  NEUT # 4.2  1.9-7.2 - K/uL  LYMPH# 0.80 L 1.10-2.70 - K/uL  MONO # 0.6  0.3-0.8 - K/uL  EOS # 0.2  0.0-0.6 - K/uL  BASO # 0.1  0.0-0.1 - K/uL   [name] [name] 12/23/2012 09:55:57 PM > forward to priamry MD Corson,Danielle 12/24/2012 08:35:07 AM > For Cardioversion. TO PCP        Lab: Basic Metabolic  GLUCOSE 79  70-99 - mg/dL  BUN 18  6-26 - mg/dL  CREATININE 1.50 H 0.60-1.30 - mg/dl  eGFR (NON-AFRICAN AMERICAN) 45 L >60 - calc  eGFR (AFRICAN  AMERICAN) 55 L >60 - calc  SODIUM 139  136-145 - mmol/L  POTASSIUM 4.4  3.5-5.5 - mmol/L  CHLORIDE 101    98-107 - mmol/L  C02 32  22-32 - mmol/L  ANION GAP 10.5  6.0-20.0 - mmol/L  CALCIUM 8.8  8.6-10.3 - mg/dL   [name] [name] 12/23/2012 09:56:23 PM > stable Corson,Danielle 12/24/2012 08:35:35 AM > For Cardioversion          Procedure Codes: 93000 EKG I AND R, 80048 ECL BMP, 85025 ECL CBC PLATELET DIFF, 36415 BLOOD COLLECTION ROUTINE VENIPUNCTURE       Follow Up: DCCV         Provider: Cristal Howatt, MD  Patient: [name] [name], [name] [name] DOB: 10/12/1931 Date: 12/23/2012       

## 2012-12-29 ENCOUNTER — Encounter (HOSPITAL_COMMUNITY)
Admission: RE | Disposition: A | Discharge status: Home / Self Care | Payer: Self-pay | Source: Ambulatory Visit | Attending: Cardiology

## 2012-12-29 ENCOUNTER — Ambulatory Visit (HOSPITAL_COMMUNITY): Payer: Medicare Other | Admitting: Anesthesiology

## 2012-12-29 ENCOUNTER — Encounter (HOSPITAL_COMMUNITY): Payer: Self-pay | Admitting: Anesthesiology

## 2012-12-29 ENCOUNTER — Encounter (HOSPITAL_COMMUNITY): Payer: Self-pay | Admitting: Gastroenterology

## 2012-12-29 ENCOUNTER — Ambulatory Visit (HOSPITAL_COMMUNITY)
Admission: RE | Admit: 2012-12-29 | Discharge: 2012-12-29 | Disposition: A | Discharge status: Home / Self Care | Payer: Medicare Other | Source: Ambulatory Visit | Attending: Cardiology | Admitting: Cardiology

## 2012-12-29 DIAGNOSIS — E039 Hypothyroidism, unspecified: Secondary | ICD-10-CM | POA: Insufficient documentation

## 2012-12-29 DIAGNOSIS — I4891 Unspecified atrial fibrillation: Secondary | ICD-10-CM | POA: Insufficient documentation

## 2012-12-29 DIAGNOSIS — I10 Essential (primary) hypertension: Secondary | ICD-10-CM | POA: Insufficient documentation

## 2012-12-29 DIAGNOSIS — I4719 Other supraventricular tachycardia: Secondary | ICD-10-CM | POA: Diagnosis present

## 2012-12-29 DIAGNOSIS — I498 Other specified cardiac arrhythmias: Secondary | ICD-10-CM | POA: Insufficient documentation

## 2012-12-29 DIAGNOSIS — R7309 Other abnormal glucose: Secondary | ICD-10-CM | POA: Insufficient documentation

## 2012-12-29 DIAGNOSIS — I471 Supraventricular tachycardia: Secondary | ICD-10-CM

## 2012-12-29 DIAGNOSIS — I2510 Atherosclerotic heart disease of native coronary artery without angina pectoris: Secondary | ICD-10-CM | POA: Insufficient documentation

## 2012-12-29 DIAGNOSIS — G4733 Obstructive sleep apnea (adult) (pediatric): Secondary | ICD-10-CM | POA: Insufficient documentation

## 2012-12-29 DIAGNOSIS — I428 Other cardiomyopathies: Secondary | ICD-10-CM | POA: Insufficient documentation

## 2012-12-29 DIAGNOSIS — I501 Left ventricular failure: Secondary | ICD-10-CM | POA: Insufficient documentation

## 2012-12-29 HISTORY — PX: CARDIOVERSION: SHX1299

## 2012-12-29 SURGERY — CARDIOVERSION
Anesthesia: Monitor Anesthesia Care

## 2012-12-29 MED ORDER — SODIUM CHLORIDE 0.9 % IV SOLN
INTRAVENOUS | Status: DC
  Administered 2012-12-29: 500 mL via INTRAVENOUS

## 2012-12-29 MED ORDER — PROPOFOL 10 MG/ML IV BOLUS
INTRAVENOUS | Status: DC | PRN
  Administered 2012-12-29: 40 mg via INTRAVENOUS

## 2012-12-29 MED ORDER — AMIODARONE HCL 200 MG PO TABS: ORAL_TABLET | ORAL | Status: DC

## 2012-12-29 NOTE — Anesthesia Postprocedure Evaluation (Signed)
  Anesthesia Post-op Note  Patient: [name] [name]  Procedure(s) Performed: Procedure(s): CARDIOVERSION (N/A)  Patient Location: PACU and Endoscopy Unit  Anesthesia Type:General  Level of Consciousness: awake and alert   Airway and Oxygen Therapy: Patient Spontanous Breathing and Patient connected to nasal cannula oxygen  Post-op Pain: none  Post-op Assessment: Post-op Vital signs reviewed, Patient's Cardiovascular Status Stable, Respiratory Function Stable and Patent Airway  Post-op Vital Signs: Reviewed and stable  Complications: No apparent anesthesia complications

## 2012-12-29 NOTE — H&P (View-Only) (Signed)
Office Visit     Patient: [name] [name], [name] [name] Account Number: [id number] Provider: Armanda Magic, MD  DOB: February 16, 1932 Age: 79 Y Sex: Male Date: 12/23/2012  Phone: [phone]   Address: [address] [place], [id number]  Pcp: [name] [name]          1. Per TT.        HPI:  General:  [name] [name] is a 79 yo male with a hx of CAD, PAF, OSA, and DCM with BIV-ICD with EF 15% who recently went to the ER with complaints of SOB and palpitations with HR 130bpm. By the time he had gotten to the ER his HR was normal. interrogation of his AICD showed multiple short bursts of PAF over the past week with RVR. He was found to have an elevated BNP and his lasix dose was uptitrated. He was seen by me last week and I increased his Lasix dose. His amio was also increased due to a low amio level and he is currently being loaded. He still has some DOE and LE edema. He weight is up a few pounds but optivol index is in the normal range. .        ROS:  See HPI, A twelve system review was perfomed at today's visit. For pertinent positives and negatives see HPI.       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        Surgical History: status post biventricular AICD implantation 08/08, cholecystectomy 2000, TURP .        Family History: Father: deceased 40 yrs, demientia, died form pneumoniaMother: deceased 68 yrs, enlarged heart       Social History:  General:  History of smoking  cigarettes: Never smoked Alcohol: yes, occasionally.  no Recreational drug use.  Marital Status: married.  Children: 4 children.        Medications: Taking Simvastatin 10MG  Tablet 1 tablet Once a day, Taking Levothyroxine Sodium Tablet 1 tablet every morning, Taking Potassium Chloride 20 MEQ Tablet Extended Release 1 capsule once a day, Taking Aspirin 81 MG Tablet Delayed Release 1 tablet Once a day, Taking Coreg 3.125 MG Tablet 1 tablet with food Twice a day, Taking Altace 2.5 MG Capsule 1 capsule Once a day, Taking Warfarin Sodium 5 MG Tablet per pharmD 5 mg qd except 2.5 mg [name] (using 5 mg pills), Taking Furosemide 20MG  Tablet 2 tablets BID, Taking Amiodarone HCl 200 MG Tablet tablet 200mg  2 tablets daily for 2 weeks then decrease to 1 and 1/2 tablets daily, Medication List reviewed and reconciled with the patient       Allergies: N.K.D.A.          Vitals: Wt 238, Wt change .8 lb, Ht 75.5, BMI 29.35, Pulse sitting 127, BP sitting 110/79.       Examination:  Cardiology, General:  GENERAL APPEARANCE: pleasant, NAD.  HEENT: unremarkable.  CAROTID UPSTROKE: normal, no bruit.  JVD: flat.  HEART SOUNDS: regular, normal S1, S2, no S3 or S4.  MURMUR: absent.  LUNGS: no rales or wheezes.  ABDOMEN: soft, non tender, positive bowel sounds, no masses felt.  EXTREMITIES: no leg edema.  PERIPHERAL PULSES: 2 plus bilateral.  Assessment:  1. Atrial fibrillation - 427.31 (Primary)  2. Coronary atherosclerosis of native coronary artery - 414.01  3. Essential hypertension, benign - 401.1  4. Other primary cardiomyopathies - 425.4  5. Left heart failure - 428.1  6. SOB (shortness of breath) - 786.05        1. Atrial fibrillation  Continue Warfarin Sodium Tablet, 5 MG, per pharmD, Orally, 5 mg qd except 2.5 mg [name] (using 5 mg pills) ; Continue Amiodarone HCl Tablet, 200 MG, tablet, Orally, 200mg  2 tablets daily for 2 weeks then decrease to 1 and 1/2 tablets daily .  LAB: PT (Prothrombin Time) (161096) LAB: CBC with Diff LAB: Basic Metabolic Imaging: EKG atrial flutter with ventricular tracking and pacing      [name] [name] 12/23/2012 11:34:50 AM > [name] [name] 12/23/2012 01:42:20 PM >    Notes: His AICD was changed to DDIR so as not to track the aflutter with tachycardia. I am going to check with his primary MD to see if he has been therapeutic with his INR and if so I will set him up for DCCV next Monday.       2. Essential hypertension, benign  Continue Altace Capsule, 2.5 MG, 1 capsule, Orally, Once a day .       3. Other primary cardiomyopathies  Continue Furosemide Tablet, 20MG , 2 tablets, by mouth, BID ; Continue Altace Capsule, 2.5 MG, 1 capsule, Orally, Once a day ; Continue Coreg Tablet, 3.125 MG, 1 tablet with food, by mouth, Twice a day .       4. Left heart failure  Continue Furosemide Tablet, 20MG , 2 tablets, by mouth, BID .        Procedures:  Venipuncture:  Venipuncture: [name] [name] 12/23/2012 02:16:22 PM > , performed in left arm.           Labs:    Lab: PT (Prothrombin Time) (045409)  Prothrombin Time 35.8 H 9.1-12.0 - SEC  INR 3.4 H 0.8-1.2 -           Lab: CBC with Diff  WBC 5.9  4.0-11.0 - K/ul  RBC 4.33  4.20-5.80 - [name]/uL  HGB 13.0  13.0-17.0 - g/dL  HCT 81.1  91.4-78.2 - %  MCH 30.1  27.0-33.0 - pg  MPV 9.8  7.5-10.7 - fL  MCV 92.7  80.0-94.0 - fL  MCHC 32.5  32.0-36.0 - g/dL  RDW 95.6 H 21.3-08.6 - %  NRBC# 0.00  -   PLT 127 L 150-400 - K/uL  NEUT % 71.2  43.3-71.9 - %  NRBC% 0.10  - %  LYMPH% 14.4 L 16.8-43.5 - %  MONO % 10.6  4.6-12.4 - %  EOS % 2.8  0.0-7.8 - %  BASO % 1.0  0.0-1.0 - %  NEUT # 4.2  1.9-7.2 - K/uL  LYMPH# 0.80 L 1.10-2.70 - K/uL  MONO # 0.6  0.3-0.8 - K/uL  EOS # 0.2  0.0-0.6 - K/uL  BASO # 0.1  0.0-0.1 - K/uL   [name] [name] 12/23/2012 09:55:57 PM > forward to priamry MD [name] [name] 12/24/2012 08:35:07 AM > For Cardioversion. TO PCP        Lab: Basic Metabolic  GLUCOSE 79  70-99 - mg/dL  BUN 18  5-78 - mg/dL  CREATININE 4.69 H 6.29-5.28 - mg/dl  eGFR (NON-AFRICAN AMERICAN) 45 L >60 - calc  eGFR (AFRICAN  AMERICAN) 55 L >60 - calc  SODIUM 139  136-145 - mmol/L  POTASSIUM 4.4  3.5-5.5 - mmol/L  CHLORIDE 101  98-107 - mmol/L  C02 32  22-32 - mmol/L  ANION GAP 10.5  6.0-20.0 - mmol/L  CALCIUM 8.8  8.6-10.3 - mg/dL   [name] [name] 16/03/9603 09:56:23 PM > stable [name] [name] 12/24/2012 08:35:35 AM > For Cardioversion          Procedure Codes: 54098 EKG I AND R, 80048 ECL BMP, 85025 ECL CBC PLATELET DIFF, 11914 BLOOD COLLECTION ROUTINE VENIPUNCTURE       Follow Up: DCCV         Provider: Armanda Magic, MD  Patient: [name] [name], [name] [name] DOB: 23-Jun-1932 Date: 12/23/2012

## 2012-12-29 NOTE — Interval H&P Note (Signed)
History and Physical Interval Note:  12/29/2012 1:40 PM  [name] [name]  has presented today for surgery, with the diagnosis of Afib  The various methods of treatment have been discussed with the patient and family. After consideration of risks, benefits and other options for treatment, the patient has consented to  Procedure(s): CARDIOVERSION (N/A) as a surgical intervention .  The patient's history has been reviewed, patient examined, no change in status, stable for surgery.  I have reviewed the patient's chart and labs.  Questions were answered to the patient's satisfaction.     Akeia Perot R

## 2012-12-29 NOTE — CV Procedure (Signed)
Electrical Cardioversion Procedure Note [name] [name] [id number] 1932/04/14  Procedure: Electrical Cardioversion Indications:  Atrial tachycardia with therapeutic INR >2.3 for 5 months Time Out: Verified patient identification, verified procedure,medications/allergies/relevent history reviewed, required imaging and test results available.  Performed  Procedure Details  The patient was NPO after midnight. Anesthesia was administered at the beside  by Dr.Smith 40mg  of propofol.  Cardioversion was done with synchronized biphasic defibrillation with AP pads with 150 watts.  The patient converted to normal sinus rhythm. The patient tolerated the procedure well   IMPRESSION:  Successful cardioversion of atrial tachycardia    TURNER,TRACI R 12/29/2012, 1:41 PM

## 2012-12-29 NOTE — Interval H&P Note (Signed)
History and Physical Interval Note:  12/29/2012 1:40 PM  [name] [name]  has presented today for surgery, with the diagnosis of Afib  The various methods of treatment have been discussed with the patient and family. After consideration of risks, benefits and other options for treatment, the patient has consented to  Procedure(s): CARDIOVERSION (N/A) as a surgical intervention .  The patient's history has been reviewed, patient examined, no change in status, stable for surgery.  I have reviewed the patient's chart and labs.  Questions were answered to the patient's satisfaction.     [name] R   

## 2012-12-29 NOTE — Preoperative (Signed)
Beta Blockers   Reason not to administer Beta Blockers:Not Applicable 

## 2012-12-29 NOTE — Transfer of Care (Signed)
Immediate Anesthesia Transfer of Care Note  Patient: [name] [name]  Procedure(s) Performed: Procedure(s): CARDIOVERSION (N/A)  Patient Location: PACU and Endoscopy Unit  Anesthesia Type:General  Level of Consciousness: awake and alert   Airway & Oxygen Therapy: Patient Spontanous Breathing and Patient connected to nasal cannula oxygen  Post-op Assessment: Report given to PACU RN and Post -op Vital signs reviewed and stable  Post vital signs: Reviewed and stable  Complications: No apparent anesthesia complications

## 2012-12-29 NOTE — Anesthesia Preprocedure Evaluation (Signed)
Anesthesia Evaluation  Patient identified by MRN, date of birth, ID band Patient awake    Reviewed: Allergy & Precautions, H&P , NPO status , Patient's Chart, lab work & pertinent test results  Airway Mallampati: II      Dental  (+) Teeth Intact   Pulmonary shortness of breath and at rest, sleep apnea ,    Pulmonary exam normal       Cardiovascular +CHF + dysrhythmias Atrial Fibrillation Rhythm:Irregular Rate:Abnormal     Neuro/Psych    GI/Hepatic   Endo/Other  Hypothyroidism   Renal/GU      Musculoskeletal   Abdominal Normal abdominal exam  (+)   Peds  Hematology   Anesthesia Other Findings   Reproductive/Obstetrics                           Anesthesia Physical Anesthesia Plan  ASA: III  Anesthesia Plan: General   Post-op Pain Management:    Induction: Intravenous  Airway Management Planned: Mask  Additional Equipment:   Intra-op Plan:   Post-operative Plan:   Informed Consent: I have reviewed the patients History and Physical, chart, labs and discussed the procedure including the risks, benefits and alternatives for the proposed anesthesia with the patient or authorized representative who has indicated his/her understanding and acceptance.   Dental advisory given  Plan Discussed with: CRNA, Anesthesiologist and Surgeon  Anesthesia Plan Comments:         Anesthesia Quick Evaluation

## 2012-12-29 NOTE — Progress Notes (Signed)
Patient presents today for DCCV for underlying atrial flutter.  Today he was noted to have a more organized rhythm at an atrial rate of 120bpm by pacer interrogation c/w atrial tachycardia after loading with Amiodarone.

## 2012-12-29 NOTE — Progress Notes (Signed)
INR resulted 3.14. Dr Mayford Knife notified and order that pt stay on current Coumadin dosage. Pt called and made aware. Pt verbalized understanding. [place], Rosanna Randy

## 2012-12-30 ENCOUNTER — Encounter (HOSPITAL_COMMUNITY): Payer: Self-pay | Admitting: Cardiology

## 2013-01-22 ENCOUNTER — Encounter (HOSPITAL_COMMUNITY): Payer: Self-pay | Admitting: Emergency Medicine

## 2013-01-22 ENCOUNTER — Emergency Department (HOSPITAL_COMMUNITY): Payer: Medicare Other

## 2013-01-22 ENCOUNTER — Inpatient Hospital Stay (HOSPITAL_COMMUNITY)
Admission: EM | Admit: 2013-01-22 | Discharge: 2013-01-27 | DRG: 292 | Disposition: A | Discharge status: Home / Self Care | Payer: Medicare Other | Attending: Cardiology | Admitting: Cardiology

## 2013-01-22 DIAGNOSIS — I5043 Acute on chronic combined systolic (congestive) and diastolic (congestive) heart failure: Principal | ICD-10-CM | POA: Diagnosis present

## 2013-01-22 DIAGNOSIS — Z7901 Long term (current) use of anticoagulants: Secondary | ICD-10-CM

## 2013-01-22 DIAGNOSIS — I4891 Unspecified atrial fibrillation: Secondary | ICD-10-CM

## 2013-01-22 DIAGNOSIS — F329 Major depressive disorder, single episode, unspecified: Secondary | ICD-10-CM | POA: Diagnosis present

## 2013-01-22 DIAGNOSIS — R350 Frequency of micturition: Secondary | ICD-10-CM | POA: Diagnosis present

## 2013-01-22 DIAGNOSIS — G4733 Obstructive sleep apnea (adult) (pediatric): Secondary | ICD-10-CM | POA: Diagnosis present

## 2013-01-22 DIAGNOSIS — I472 Ventricular tachycardia, unspecified: Secondary | ICD-10-CM | POA: Diagnosis present

## 2013-01-22 DIAGNOSIS — D696 Thrombocytopenia, unspecified: Secondary | ICD-10-CM | POA: Diagnosis present

## 2013-01-22 DIAGNOSIS — R609 Edema, unspecified: Secondary | ICD-10-CM | POA: Diagnosis present

## 2013-01-22 DIAGNOSIS — I471 Supraventricular tachycardia: Secondary | ICD-10-CM

## 2013-01-22 DIAGNOSIS — I2510 Atherosclerotic heart disease of native coronary artery without angina pectoris: Secondary | ICD-10-CM | POA: Diagnosis present

## 2013-01-22 DIAGNOSIS — I5023 Acute on chronic systolic (congestive) heart failure: Secondary | ICD-10-CM

## 2013-01-22 DIAGNOSIS — I4729 Other ventricular tachycardia: Secondary | ICD-10-CM | POA: Diagnosis present

## 2013-01-22 DIAGNOSIS — E785 Hyperlipidemia, unspecified: Secondary | ICD-10-CM | POA: Diagnosis present

## 2013-01-22 DIAGNOSIS — I428 Other cardiomyopathies: Secondary | ICD-10-CM | POA: Diagnosis present

## 2013-01-22 DIAGNOSIS — W010XXA Fall on same level from slipping, tripping and stumbling without subsequent striking against object, initial encounter: Secondary | ICD-10-CM | POA: Diagnosis not present

## 2013-01-22 DIAGNOSIS — F3289 Other specified depressive episodes: Secondary | ICD-10-CM | POA: Diagnosis present

## 2013-01-22 DIAGNOSIS — I509 Heart failure, unspecified: Secondary | ICD-10-CM | POA: Diagnosis present

## 2013-01-22 DIAGNOSIS — E876 Hypokalemia: Secondary | ICD-10-CM | POA: Diagnosis present

## 2013-01-22 DIAGNOSIS — I447 Left bundle-branch block, unspecified: Secondary | ICD-10-CM | POA: Diagnosis present

## 2013-01-22 DIAGNOSIS — E039 Hypothyroidism, unspecified: Secondary | ICD-10-CM | POA: Diagnosis present

## 2013-01-22 DIAGNOSIS — S5000XA Contusion of unspecified elbow, initial encounter: Secondary | ICD-10-CM | POA: Diagnosis not present

## 2013-01-22 DIAGNOSIS — Y9210 Unspecified residential institution as the place of occurrence of the external cause: Secondary | ICD-10-CM | POA: Diagnosis not present

## 2013-01-22 DIAGNOSIS — Z95810 Presence of automatic (implantable) cardiac defibrillator: Secondary | ICD-10-CM

## 2013-01-22 LAB — CBC WITH DIFFERENTIAL/PLATELET
Basophils Absolute: 0 10*3/uL (ref 0.0–0.1)
Eosinophils Absolute: 0.1 10*3/uL (ref 0.0–0.7)
Eosinophils Relative: 1 % (ref 0–5)
HCT: 37.2 % — ABNORMAL LOW (ref 39.0–52.0)
Lymphs Abs: 0.6 10*3/uL — ABNORMAL LOW (ref 0.7–4.0)
MCH: 30.1 pg (ref 26.0–34.0)
MCV: 91.9 fL (ref 78.0–100.0)
Monocytes Absolute: 0.6 10*3/uL (ref 0.1–1.0)
Platelets: 220 10*3/uL (ref 150–400)
RDW: 14.9 % (ref 11.5–15.5)

## 2013-01-22 LAB — PROTIME-INR: Prothrombin Time: 31.5 seconds — ABNORMAL HIGH (ref 11.6–15.2)

## 2013-01-22 LAB — COMPREHENSIVE METABOLIC PANEL
ALT: 14 U/L (ref 0–53)
Calcium: 8.9 mg/dL (ref 8.4–10.5)
Creatinine, Ser: 1.4 mg/dL — ABNORMAL HIGH (ref 0.50–1.35)
GFR calc Af Amer: 54 mL/min — ABNORMAL LOW (ref >90)
GFR calc non Af Amer: 47 mL/min — ABNORMAL LOW (ref >90)
Glucose, Bld: 123 mg/dL — ABNORMAL HIGH (ref 70–99)
Sodium: 134 mEq/L — ABNORMAL LOW (ref 135–145)
Total Protein: 7.4 g/dL (ref 6.0–8.3)

## 2013-01-22 LAB — POCT I-STAT TROPONIN I: Troponin i, poc: 0.04 ng/mL (ref 0.00–0.08)

## 2013-01-22 MED ORDER — ACETAMINOPHEN 325 MG PO TABS: 650.0000 mg | ORAL_TABLET | ORAL | Status: DC | PRN

## 2013-01-22 MED ORDER — FUROSEMIDE 10 MG/ML IJ SOLN
20.0000 mg | Freq: Once | INTRAMUSCULAR | Status: AC
  Administered 2013-01-22: 20 mg via INTRAVENOUS
  Filled 2013-01-22: qty 2

## 2013-01-22 MED ORDER — SODIUM CHLORIDE 0.9 % IJ SOLN
3.0000 mL | Freq: Two times a day (BID) | INTRAMUSCULAR | Status: DC
  Administered 2013-01-23 – 2013-01-27 (×5): 3 mL via INTRAVENOUS

## 2013-01-22 MED ORDER — CARVEDILOL 3.125 MG PO TABS
3.1250 mg | ORAL_TABLET | Freq: Two times a day (BID) | ORAL | Status: DC
  Administered 2013-01-23 – 2013-01-27 (×9): 3.125 mg via ORAL
  Filled 2013-01-22 (×11): qty 1

## 2013-01-22 MED ORDER — SODIUM CHLORIDE 0.9 % IJ SOLN: 3.0000 mL | INTRAMUSCULAR | Status: DC | PRN

## 2013-01-22 MED ORDER — WARFARIN SODIUM 2.5 MG PO TABS: 2.5000 mg | ORAL_TABLET | Freq: Every evening | ORAL | Status: DC

## 2013-01-22 MED ORDER — RAMIPRIL 2.5 MG PO CAPS
2.5000 mg | ORAL_CAPSULE | Freq: Every evening | ORAL | Status: DC
  Administered 2013-01-23 – 2013-01-26 (×4): 2.5 mg via ORAL
  Filled 2013-01-22 (×5): qty 1

## 2013-01-22 MED ORDER — SIMVASTATIN 10 MG PO TABS
10.0000 mg | ORAL_TABLET | Freq: Every evening | ORAL | Status: DC
  Administered 2013-01-23 – 2013-01-26 (×4): 10 mg via ORAL
  Filled 2013-01-22 (×5): qty 1

## 2013-01-22 MED ORDER — FUROSEMIDE 10 MG/ML IJ SOLN
40.0000 mg | Freq: Two times a day (BID) | INTRAMUSCULAR | Status: DC
  Administered 2013-01-23: 40 mg via INTRAVENOUS
  Filled 2013-01-22 (×3): qty 4

## 2013-01-22 MED ORDER — AMIODARONE HCL 200 MG PO TABS
300.0000 mg | ORAL_TABLET | Freq: Every day | ORAL | Status: DC
  Administered 2013-01-23 – 2013-01-27 (×5): 300 mg via ORAL
  Filled 2013-01-22 (×5): qty 1

## 2013-01-22 MED ORDER — LEVOTHYROXINE SODIUM 125 MCG PO TABS
125.0000 ug | ORAL_TABLET | Freq: Every day | ORAL | Status: DC
  Administered 2013-01-23 – 2013-01-27 (×5): 125 ug via ORAL
  Filled 2013-01-22 (×6): qty 1

## 2013-01-22 MED ORDER — WARFARIN SODIUM 2.5 MG PO TABS
2.5000 mg | ORAL_TABLET | ORAL | Status: DC
  Filled 2013-01-22: qty 1

## 2013-01-22 MED ORDER — WARFARIN - PHYSICIAN DOSING INPATIENT: Freq: Every day | Status: DC

## 2013-01-22 MED ORDER — NITROGLYCERIN 2 % TD OINT
1.0000 [in_us] | TOPICAL_OINTMENT | Freq: Four times a day (QID) | TRANSDERMAL | Status: DC
  Administered 2013-01-22 – 2013-01-24 (×8): 1 [in_us] via TOPICAL
  Filled 2013-01-22: qty 1
  Filled 2013-01-22: qty 30

## 2013-01-22 MED ORDER — WARFARIN SODIUM 2.5 MG PO TABS: 2.5000 mg | ORAL_TABLET | ORAL | Status: DC

## 2013-01-22 MED ORDER — ONDANSETRON HCL 4 MG/2ML IJ SOLN: 4.0000 mg | Freq: Four times a day (QID) | INTRAMUSCULAR | Status: DC | PRN

## 2013-01-22 MED ORDER — PANTOPRAZOLE SODIUM 40 MG PO TBEC
40.0000 mg | DELAYED_RELEASE_TABLET | Freq: Every day | ORAL | Status: DC
  Administered 2013-01-23 – 2013-01-27 (×5): 40 mg via ORAL
  Filled 2013-01-22 (×5): qty 1

## 2013-01-22 MED ORDER — SODIUM CHLORIDE 0.9 % IV SOLN
250.0000 mL | INTRAVENOUS | Status: DC | PRN
  Administered 2013-01-24: 250 mL via INTRAVENOUS

## 2013-01-22 NOTE — ED Provider Notes (Signed)
CSN: [id number]     Arrival date & time 01/22/13  1902 History     First MD Initiated Contact with Patient 01/22/13 1958     Chief Complaint  Patient presents with  . Shortness of Breath   (Consider location/radiation/quality/duration/timing/severity/associated sxs/prior Treatment) HPI 79 year old male with history of nonischemic cardiomyopathy EF 10-15%, atrial fibrillation s/p cardioversion 1 month ago, pacemaker/defibrillator, hyperlipidemia presents with increasing shortness of breath on exertion.  Patient notes slowly worsening dyspnea on exertion for the last 3 weeks, with family noting today he walked from bathroom to chair and was unable to speak for 10 minutes secondary to his shortness of breath.  Reports he has also had increased bloating, burping and received nexium from PCP.  Patient notes that feeling of gas/discomfort in his epigastrium radiating to chest is improved by burping.  Reports lower extremity edema however is not sure if it is worse.  Reports Dr. Mayford Knife is his cardiologist and lasix dose was recently decreased.   Past Medical History  Diagnosis Date  . Nonischemic cardiomyopathy     ef 10 %  . BPH (benign prostatic hypertrophy)   . Hypothyroidism   . Left bundle branch block   . Atrial fibrillation     fibrillation/flutter  . Depression   . Sleep apnea     on CPAP.    Marland Kitchen Hyperlipidemia   . Thrombocytopenia   . Osteoarthritis   . CHF (congestive heart failure)    Past Surgical History  Procedure Laterality Date  . Transurethral resection of prostate      for BPH  . Cholecystectomy    . Colonscopy      reportedly negative per pt; around 2005.    . Cardioversion N/A 12/29/2012    Procedure: CARDIOVERSION;  Surgeon: Quintella Reichert, MD;  Location: [facility];  Service: Cardiovascular;  Laterality: N/A;   Family History  Problem Relation Age of Onset  . Heart failure Mother   . Heart disease Father   . Cancer Maternal Uncle     Unknown subtype    History  Substance Use Topics  . Smoking status: Never Smoker   . Smokeless tobacco: Not on file  . Alcohol Use: No    Review of Systems  Constitutional: Negative for fever, chills and diaphoresis.  HENT: Negative for congestion.   Eyes: Negative for visual disturbance.  Respiratory: Positive for cough (dry) and shortness of breath.   Cardiovascular: Positive for leg swelling. Negative for chest pain.  Gastrointestinal: Positive for abdominal pain (bloating) and abdominal distention. Negative for nausea, vomiting, diarrhea, constipation and blood in stool.  Genitourinary: Negative for dysuria.  Musculoskeletal: Negative for back pain.  Skin: Negative for rash.  Neurological: Negative for syncope and headaches.    Allergies  Review of patient's allergies indicates no known allergies.  Home Medications   Current Outpatient Rx  Name  Route  Sig  Dispense  Refill  . amiodarone (PACERONE) 200 MG tablet   Oral   Take 300 mg by mouth daily. Take 1 and 1/2 tablets daily         . carvedilol (COREG) 3.125 MG tablet   Oral   Take 3.125 mg by mouth 2 (two) times daily with a meal.           . esomeprazole (NEXIUM) 40 MG capsule   Oral   Take 40 mg by mouth daily before breakfast.         . furosemide (LASIX) 20 MG tablet  Oral   Take 20 mg by mouth 2 (two) times daily.          Marland Kitchen levothyroxine (SYNTHROID, LEVOTHROID) 125 MCG tablet   Oral   Take 125 mcg by mouth daily before breakfast.          . potassium chloride SA (K-DUR,KLOR-CON) 20 MEQ tablet   Oral   Take 20 mEq by mouth every morning.          . ramipril (ALTACE) 2.5 MG tablet   Oral   Take 2.5 mg by mouth every evening.          . simvastatin (ZOCOR) 10 MG tablet   Oral   Take 10 mg by mouth every evening.          . warfarin (COUMADIN) 5 MG tablet   Oral   Take 2.5-5 mg by mouth every evening. Take one tab (5 mg) on Tues & Thurs & 1/2 tab (2.5mg ) on all other days          BP  130/79  Pulse 78  Temp(Src) 98.2 F (36.8 C) (Oral)  Resp 14  SpO2 92% Physical Exam  Nursing note and vitals reviewed. Constitutional: He is oriented to person, place, and time. He appears well-developed and well-nourished. No distress.  HENT:  Head: Normocephalic and atraumatic.  Eyes: Conjunctivae and EOM are normal.  Neck: Normal range of motion.  Cardiovascular: Normal rate, regular rhythm, normal heart sounds and intact distal pulses.  Exam reveals no gallop and no friction rub.   No murmur heard. Pulmonary/Chest: Effort normal. No respiratory distress. He has no wheezes. He has rales.  Abdominal: Soft. There is no tenderness. There is no guarding.  Musculoskeletal: He exhibits edema (2+ bilaterally).  Neurological: He is alert and oriented to person, place, and time.  Skin: Skin is warm and dry. No rash noted. He is not diaphoretic.    ED Course   Procedures (including critical care time)  Labs Reviewed  CBC WITH DIFFERENTIAL - Abnormal; Notable for the following:    RBC 4.05 (*)    Hemoglobin 12.2 (*)    HCT 37.2 (*)    Neutrophils Relative % 85 (*)    Lymphocytes Relative 7 (*)    Lymphs Abs 0.6 (*)    All other components within normal limits  COMPREHENSIVE METABOLIC PANEL - Abnormal; Notable for the following:    Sodium 134 (*)    Glucose, Bld 123 (*)    Creatinine, Ser 1.40 (*)    GFR calc non Af Amer 47 (*)    GFR calc Af Amer 54 (*)    All other components within normal limits  PRO B NATRIURETIC PEPTIDE - Abnormal; Notable for the following:    Pro B Natriuretic peptide (BNP) 6466.0 (*)    All other components within normal limits  POCT I-STAT TROPONIN I   Dg Chest 2 View  01/22/2013   *RADIOLOGY REPORT*  Clinical Data: Shortness of breath and cough.  CHEST - 2 VIEW  Comparison: 01/13/2013  Findings: Diffuse bilateral airspace disease is favored to represent acute pulmonary edema.  The heart remains moderately enlarged and there is stable appearance of a  biventricular pacing / defibrillator device.  No significant pleural fluid is identified. There is no evidence of pulmonary consolidation.  Stable degenerative changes are present in the spine.  IMPRESSION: Diffuse pulmonary edema.   Original Report Authenticated By: Irish Lack, M.D.   No diagnosis found.  MDM  79 year old male  with history of nonischemic cardiomyopathy EF 10-15%, atrial fibrillation s/p cardioversion 1 month ago, pacemaker/defibrillator, hyperlipidemia presents with increasing shortness of breath on exertion.  EKG evaluated by me and shows normal sinus rhythm, no acute ST changes.  First troponin WNL.  CMP shows increased Cr to 1.4 from 1.26. BNP 6466 and CXR consistent with pulmonary edema.  Most likely diagnosis is CHF exacerbation, less likely ACS, PE and unlikely pneumonia given no fever.  Patient admitted to cardiology service in stable condition.  Care discussed with Dr. Ethelda Chick.  Rhae Lerner, MD 01/23/13 0153  Rhae Lerner, MD 01/23/13 9147  Rhae Lerner, MD 01/26/13 1955  Rhae Lerner, MD 01/26/13 2011

## 2013-01-22 NOTE — H&P (Signed)
Cardiology History and Physical  [name], MD  History of Present Illness (and review of medical records): [name] [name] is a 79 y.o. male who presents for evaluation of shortness of breath.  He is followed by Dr. Armanda Magic with known history of CAD, PAF, OSA, and DCM with BIV-ICD with EF 15%.  He recently underwent DCCV for atrial tachycardia 12/24/12.  He felt better for about a week post procedure.  He states however for past two weeks at least, he has noticed progressive worsening of dyspnea on exertion.  Today, family noticed significant distress with ambulation to bathroom at home.  This has been associated with increased lower extremity swelling, and abdominal distention.  He denies PND or orthopnea, but wife states that he did have trouble if not wearing CPAP this week.  He otherwise denies any chest pain, palpitations, presyncope, or syncope.  His ICD has not fired.  He has been compliant with medications.  Review of Systems He has had recent dyspepsia with occasional loose stools. Recently had PPI adjusted with no significant improvement in symptoms.  No blood in stools or urine.  No change in urine output. Further review of systems was otherwise negative other than stated in HPI.  Patient Active Problem List   Diagnosis Date Noted  . Atrial tachycardia 2020/09/1012  . Nonischemic cardiomyopathy   . Left bundle branch block   . Thrombocytopenia   . Biventricular implantable cardiac defibrillator in situ 01/10/2011  . DYSPNEA 08/09/2010  . CARDIOMYOPATHY, PRIMARY, DILATED 06/23/2009  . VENTRICULAR TACHYCARDIA 06/23/2009  . HYPOTHYROIDISM 06/22/2009  . HYPERLIPIDEMIA 06/22/2009  . DEPRESSION 06/22/2009  . LBBB 06/22/2009  . FIBRILLATION, ATRIAL 06/22/2009  . CONGESTIVE HEART FAILURE 06/22/2009  . SLEEP APNEA 06/22/2009   Past Medical History  Diagnosis Date  . Nonischemic cardiomyopathy     ef 10 %  . BPH (benign prostatic hypertrophy)   . Hypothyroidism    . Left bundle branch block   . Atrial fibrillation     fibrillation/flutter  . Depression   . Sleep apnea     on CPAP.    Marland Kitchen Hyperlipidemia   . Thrombocytopenia   . Osteoarthritis   . CHF (congestive heart failure)     Past Surgical History  Procedure Laterality Date  . Transurethral resection of prostate      for BPH  . Cholecystectomy    . Colonscopy      reportedly negative per pt; around 2005.    . Cardioversion N/A 12/29/2012    Procedure: CARDIOVERSION;  Surgeon: Quintella Reichert, MD;  Location: [facility];  Service: Cardiovascular;  Laterality: N/A;     (Not in a hospital admission) No Known Allergies  History  Substance Use Topics  . Smoking status: Never Smoker   . Smokeless tobacco: Not on file  . Alcohol Use: No    Family History  Problem Relation Age of Onset  . Heart failure Mother   . Heart disease Father   . Cancer Maternal Uncle     Unknown subtype     Objective:  Patient Vitals for the past 8 hrs:  BP Temp Temp src Pulse Resp SpO2  01/22/13 2215 145/98 mmHg - - 90 24 90 %  01/22/13 2200 133/83 mmHg - - 86 28 92 %  01/22/13 2145 127/86 mmHg - - 82 26 91 %  01/22/13 2130 121/73 mmHg - - 70 22 97 %  01/22/13 2100 129/88 mmHg - - 74 20 96 %  01/22/13 2045  135/91 mmHg - - 81 27 95 %  01/22/13 2036 119/82 mmHg 98.4 F (36.9 C) Oral 83 25 93 %  01/22/13 2030 147/96 mmHg - - 92 24 89 %  01/22/13 1913 130/79 mmHg 98.2 F (36.8 C) Oral 78 14 92 %   General appearance: alert, cooperative, elderly appearing male, no acute distress Head: Normocephalic, without obvious abnormality, atraumatic Eyes: conjunctivae/corneas clear. PERRL, EOM's intact. Fundi benign. Neck: positive JVD Lungs: clear to auscultation bilaterally Chest wall: no tenderness Heart: regular rate and rhythm, S1, S2 normal, Abdomen: soft, non-tender;distended, bowel sounds normal; no masses,  no organomegaly Extremities: BLE edema Pulses: 2+ and symmetric Neurologic: Grossly  normal  Results for orders placed during the hospital encounter of 01/22/13 (from the past 48 hour(s))  CBC WITH DIFFERENTIAL     Status: Abnormal   Collection Time    01/22/13  7:16 PM      Result Value Range   WBC 8.8  4.0 - 10.5 K/uL   RBC 4.05 (*) 4.22 - 5.81 MIL/uL   Hemoglobin 12.2 (*) 13.0 - 17.0 g/dL   HCT 16.1 (*) 09.6 - 04.5 %   MCV 91.9  78.0 - 100.0 fL   MCH 30.1  26.0 - 34.0 pg   MCHC 32.8  30.0 - 36.0 g/dL   RDW 40.9  81.1 - 91.4 %   Platelets 220  150 - 400 K/uL   Neutrophils Relative % 85 (*) 43 - 77 %   Neutro Abs 7.5  1.7 - 7.7 K/uL   Lymphocytes Relative 7 (*) 12 - 46 %   Lymphs Abs 0.6 (*) 0.7 - 4.0 K/uL   Monocytes Relative 7  3 - 12 %   Monocytes Absolute 0.6  0.1 - 1.0 K/uL   Eosinophils Relative 1  0 - 5 %   Eosinophils Absolute 0.1  0.0 - 0.7 K/uL   Basophils Relative 0  0 - 1 %   Basophils Absolute 0.0  0.0 - 0.1 K/uL  COMPREHENSIVE METABOLIC PANEL     Status: Abnormal   Collection Time    01/22/13  7:16 PM      Result Value Range   Sodium 134 (*) 135 - 145 mEq/L   Potassium 4.0  3.5 - 5.1 mEq/L   Chloride 98  96 - 112 mEq/L   CO2 25  19 - 32 mEq/L   Glucose, Bld 123 (*) 70 - 99 mg/dL   BUN 18  6 - 23 mg/dL   Creatinine, Ser 7.82 (*) 0.50 - 1.35 mg/dL   Calcium 8.9  8.4 - 95.6 mg/dL   Total Protein 7.4  6.0 - 8.3 g/dL   Albumin 3.7  3.5 - 5.2 g/dL   AST 25  0 - 37 U/L   ALT 14  0 - 53 U/L   Alkaline Phosphatase 98  39 - 117 U/L   Total Bilirubin 1.0  0.3 - 1.2 mg/dL   GFR calc non Af Amer 47 (*) >90 mL/min   GFR calc Af Amer 54 (*) >90 mL/min   Comment:            The eGFR has been calculated     using the CKD EPI equation.     This calculation has not been     validated in all clinical     situations.     eGFR's persistently     <90 mL/min signify     possible Chronic Kidney Disease.  PRO B NATRIURETIC PEPTIDE  Status: Abnormal   Collection Time    01/22/13  7:16 PM      Result Value Range   Pro B Natriuretic peptide (BNP)  6466.0 (*) 0 - 450 pg/mL  POCT I-STAT TROPONIN I     Status: None   Collection Time    01/22/13  7:32 PM      Result Value Range   Troponin i, poc 0.04  0.00 - 0.08 ng/mL   Comment 3            Comment: Due to the release kinetics of cTnI,     a negative result within the first hours     of the onset of symptoms does not rule out     myocardial infarction with certainty.     If myocardial infarction is still suspected,     repeat the test at appropriate intervals.  PROTIME-INR     Status: Abnormal   Collection Time    01/22/13  8:26 PM      Result Value Range   Prothrombin Time 31.5 (*) 11.6 - 15.2 seconds   INR 3.19 (*) 0.00 - 1.49   Dg Chest 2 View  01/22/2013   *RADIOLOGY REPORT*  Clinical Data: Shortness of breath and cough.  CHEST - 2 VIEW  Comparison: 01/13/2013  Findings: Diffuse bilateral airspace disease is favored to represent acute pulmonary edema.  The heart remains moderately enlarged and there is stable appearance of a biventricular pacing / defibrillator device.  No significant pleural fluid is identified. There is no evidence of pulmonary consolidation.  Stable degenerative changes are present in the spine.  IMPRESSION: Diffuse pulmonary edema.   Original Report Authenticated By: Irish Lack, M.D.    ECG:  Electronic ventricular paced rhythm, biventricular pacemaker HR 74  Assessment: Acute on chronic systolic HF Non-ischemic CMP EF 15% PAF/Atrial tachycardia on anticoagulation and amiodarone S/p BiV ICD  Plan:  1. Cardiology Admission  2. Continuous monitoring on Telemetry. 3. Repeat ekg on admit, prn chest pain or arrythmia 4. Trend cardiac biomarkers 5. Will continue diuresis with Lasix 40mg  BID 6. Monitor strict I/Os, daily weights, 1.5 liter fluid restriction, 2gm Na diet. 7. No change in home CHF meds. 8. Continue Amiodarone and Anticoagulation 9. PPI

## 2013-01-22 NOTE — ED Notes (Signed)
Report given to unit 4E RN.

## 2013-01-22 NOTE — ED Provider Notes (Addendum)
Patient with progressively worsening shortness of breath for 3 weeks. On exam patient is alert mildly dyspneic. Neck positive for jugular venous distention. Lungs Rales at right base extremities with 2+ pretibial pitting edema skin warm dry. Clinically patient is felt to be in congestive heart failure  Doug Sou, MD 01/22/13 2037  I have personally seen and examined the patient.  I have discussed the plan of care with the resident.  I have reviewed the documentation on PMH/FH/Soc. History.  I have reviewed the documentation of the resident and agree.  Doug Sou, MD 01/30/13 1452

## 2013-01-22 NOTE — ED Notes (Signed)
PT. REPORTS EXERTIONAL DYSPNEA FOR SEVERAL DAYS , " BURPING / GAS" A LOT PRESCRIBED WITH NEXIUM FOR GERD , DENIES CHEST PAIN , PT. HAS A PACEMAKER AND DEFIBRILLATOR - HIS CARDIOLOGIST IS DR. French Ana TURNER.

## 2013-01-22 NOTE — ED Notes (Signed)
Notified by radiology staff that pt tripped up over w/c foot pedals and fell while in radiology, pt assessed by radiologist at the time after incident, no change in status, no new injuries or pains reported per radiology staff, xrays done, AD & CN notified. Will continue to monitor and f/u.

## 2013-01-23 ENCOUNTER — Inpatient Hospital Stay (HOSPITAL_COMMUNITY): Payer: Medicare Other

## 2013-01-23 ENCOUNTER — Encounter (HOSPITAL_COMMUNITY): Payer: Self-pay | Admitting: Interventional Cardiology

## 2013-01-23 DIAGNOSIS — I4891 Unspecified atrial fibrillation: Secondary | ICD-10-CM

## 2013-01-23 DIAGNOSIS — I498 Other specified cardiac arrhythmias: Secondary | ICD-10-CM

## 2013-01-23 DIAGNOSIS — I059 Rheumatic mitral valve disease, unspecified: Secondary | ICD-10-CM

## 2013-01-23 DIAGNOSIS — I5023 Acute on chronic systolic (congestive) heart failure: Secondary | ICD-10-CM | POA: Insufficient documentation

## 2013-01-23 LAB — PROTIME-INR
INR: 3.33 — ABNORMAL HIGH (ref 0.00–1.49)
Prothrombin Time: 32.6 seconds — ABNORMAL HIGH (ref 11.6–15.2)

## 2013-01-23 LAB — MAGNESIUM: Magnesium: 2.1 mg/dL (ref 1.5–2.5)

## 2013-01-23 LAB — BASIC METABOLIC PANEL
CO2: 27 mEq/L (ref 19–32)
Chloride: 102 mEq/L (ref 96–112)
Creatinine, Ser: 1.32 mg/dL (ref 0.50–1.35)
GFR calc Af Amer: 58 mL/min — ABNORMAL LOW (ref >90)
Potassium: 3.7 mEq/L (ref 3.5–5.1)
Sodium: 137 mEq/L (ref 135–145)

## 2013-01-23 LAB — MRSA PCR SCREENING: MRSA by PCR: NEGATIVE

## 2013-01-23 MED ORDER — DIGOXIN 125 MCG PO TABS
0.1250 mg | ORAL_TABLET | Freq: Every day | ORAL | Status: DC
  Administered 2013-01-24 – 2013-01-27 (×4): 0.125 mg via ORAL
  Filled 2013-01-23 (×4): qty 1

## 2013-01-23 MED ORDER — WARFARIN - PHARMACIST DOSING INPATIENT: Freq: Every day | Status: DC

## 2013-01-23 MED ORDER — WARFARIN SODIUM 5 MG PO TABS: 5.0000 mg | ORAL_TABLET | ORAL | Status: DC

## 2013-01-23 MED ORDER — FUROSEMIDE 10 MG/ML IJ SOLN
10.0000 mg/h | INTRAVENOUS | Status: DC
  Administered 2013-01-23 – 2013-01-25 (×3): 10 mg/h via INTRAVENOUS
  Filled 2013-01-23 (×7): qty 25

## 2013-01-23 MED ORDER — DIGOXIN 250 MCG PO TABS
0.2500 mg | ORAL_TABLET | Freq: Once | ORAL | Status: AC
  Administered 2013-01-23: 0.25 mg via ORAL
  Filled 2013-01-23: qty 1

## 2013-01-23 NOTE — Progress Notes (Addendum)
SUBJECTIVE:  Feels better.  Left elbow is swollen after fall yesterday.  Mild bruising of the left hip area.  OBJECTIVE:   Vitals:   Filed Vitals:   01/22/13 2215 01/22/13 2346 01/22/13 2348 01/23/13 0511  BP: 145/98  110/77 110/67  Pulse: 90  74 70  Temp:   97.9 F (36.6 C) 97.4 F (36.3 C)  TempSrc:   Oral Oral  Resp: 24  22 20   Height:  6\' 2"  (1.88 m)    Weight:  105.3 kg (232 lb 2.3 oz)  104.5 kg (230 lb 6.1 oz)  SpO2: 90%  92% 93%   I&O's:   Intake/Output Summary (Last 24 hours) at 01/23/13 1250 Last data filed at 01/23/13 8657  Gross per 24 hour  Intake    403 ml  Output    400 ml  Net      3 ml   TELEMETRY: Reviewed telemetry pt in ventricular paced rhythm:     PHYSICAL EXAM General: Well developed, well nourished, in no acute distress Head:    Normal cephalic and atramatic  Lungs:  Scant bibasilar crackles Heart:  HRRR S1 S2 2/6 systolic murmur Abdomen:  abdomen soft and non-tender Msk:   Normal strength and tone for age. Extremities:  1+ bilateral ankle edema.  Apparent hematoma left elbow.  Mild left groin bruising. Neuro: Alert and oriented X 3. Psych:  Flat affect, responds appropriately   LABS: Basic Metabolic Panel:  Recent Labs  84/69/62 1916 01/23/13 0600  NA 134* 137  K 4.0 3.7  CL 98 102  CO2 25 27  GLUCOSE 123* 112*  BUN 18 18  CREATININE 1.40* 1.32  CALCIUM 8.9 8.5  MG  --  2.1   Liver Function Tests:  Recent Labs  01/22/13 1916  AST 25  ALT 14  ALKPHOS 98  BILITOT 1.0  PROT 7.4  ALBUMIN 3.7   No results found for this basename: LIPASE, AMYLASE,  in the last 72 hours CBC:  Recent Labs  01/22/13 1916  WBC 8.8  NEUTROABS 7.5  HGB 12.2*  HCT 37.2*  MCV 91.9  PLT 220   Cardiac Enzymes: No results found for this basename: CKTOTAL, CKMB, CKMBINDEX, TROPONINI,  in the last 72 hours BNP: No components found with this basename: POCBNP,  D-Dimer: No results found for this basename: DDIMER,  in the last 72  hours Hemoglobin A1C: No results found for this basename: HGBA1C,  in the last 72 hours Fasting Lipid Panel: No results found for this basename: CHOL, HDL, LDLCALC, TRIG, CHOLHDL, LDLDIRECT,  in the last 72 hours Thyroid Function Tests: No results found for this basename: TSH, T4TOTAL, FREET3, T3FREE, THYROIDAB,  in the last 72 hours Anemia Panel: No results found for this basename: VITAMINB12, FOLATE, FERRITIN, TIBC, IRON, RETICCTPCT,  in the last 72 hours Coag Panel:   Lab Results  Component Value Date   INR 3.33* 01/23/2013   INR 3.19* 01/22/2013   INR 3.14* 12/29/2012    RADIOLOGY: Dg Chest 2 View  01/22/2013   *RADIOLOGY REPORT*  Clinical Data: Shortness of breath and cough.  CHEST - 2 VIEW  Comparison: 01/13/2013  Findings: Diffuse bilateral airspace disease is favored to represent acute pulmonary edema.  The heart remains moderately enlarged and there is stable appearance of a biventricular pacing / defibrillator device.  No significant pleural fluid is identified. There is no evidence of pulmonary consolidation.  Stable degenerative changes are present in the spine.  IMPRESSION: Diffuse pulmonary edema.  Original Report Authenticated By: Irish Lack, M.D.      ASSESSMENT: Acute on chronic systolic heart failure.  Atrial tachycardia status post cardioversion.  Biventricular pacemaker  PLAN:  Recurrent heart failure with pulmonary edema noted by chest x-ray.  Continue IV Lasix.  I'm trying to obtain his last echo report.  He is already on for some, ACE inhibitor, beta blocker at home.  He already has a biventricular pacemaker. Could consider adding spironolactone.  I will consult the heart failure team given his low ejection fraction mention in the chart to see if any advanced heart failure therapies would be useful.  Plan for left elbow x-ray given significant swelling.  I suspect there may be some degree of hematoma since he fell while therapeutic on Coumadin.  He feels he is  responding to Lasix given him this morning.  His most recent urine output does not appear to be charted.  May need to increase IV furosemide.  Follow renal function closely.  Corky Crafts., MD  01/23/2013  12:50 PM

## 2013-01-23 NOTE — Consult Note (Addendum)
Advanced Heart Failure Team Consult Note  Referring Physician: Dr Eldridge Dace Primary Physician: Primary Cardiologist:  Dr Mayford Knife EP: Dr Graciela Husbands  Reason for Consultation: Recurrent heart failure.  HPI:    We were asked to provide consult by Dr. Eldridge Dace  Mr Tamargo is a 79 year old with a history of A tach S/P successful DC-CV 12/29/12, CAD, PAF on chronic coumadin and amiodarone, OSA- CPAP,  NICM cath 2008, chronic systolic heart failure EF 15%, S/P CRT-D 2008  And generator changed 2012, , LBBB, and  Hypothyroidism.  He is S/P DC-CV 12/29/12 and initially he felt good for about a week. Over the last week he reports increased dyspnea and fatigue. He has had a functional decline and only able to walk a few steps. He says his weight has been 228-232 pounds. He was taking 20 mg po lasix twice a day.  Uses CPAP every night. He lives at home with his wife.   He presented [facility] ED 01/22/13 with increased dyspnea on exertion and low extremity edema. Intermittent 40 mg IV lasix started. Admit weight 232 pounds.  Pertinent admission labs included: sodium 134, potassium 4.0, creatinine 1.4, Pro BNP 6466, and CE negative. CXR with pulmonary edema.    Review of Systems: [y] = yes, [ ]  = no   General: Weight gain [ ] ; Weight loss [ ] ; Anorexia [ ] ; Fatigue [Y ]; Fever [ ] ; Chills [ ] ; Weakness [ ]   Cardiac: Chest pain/pressure [ ] ; Resting SOB [ ] ; Exertional SOB [ Y]; Orthopnea [ ] ; Pedal Edema [ Y]; Palpitations [ ] ; Syncope [ ] ; Presyncope [ ] ; Paroxysmal nocturnal dyspnea[ ]   Pulmonary: Cough [ Y]; Wheezing[ ] ; Hemoptysis[ ] ; Sputum [ ] ; Snoring [ ]   GI: Vomiting[ ] ; Dysphagia[ ] ; Melena[ ] ; Hematochezia [ ] ; Heartburn[ ] ; Abdominal pain [ ] ; Constipation [ ] ; Diarrhea [ ] ; BRBPR [ ]   GU: Hematuria[ ] ; Dysuria [ ] ; Nocturia[ ]   Vascular: Pain in legs with walking [ ] ; Pain in feet with lying flat [ ] ; Non-healing sores [ ] ; Stroke [ ] ; TIA [ ] ; Slurred speech [ ] ;  Neuro: Headaches[ ] ; Vertigo[ ] ; Seizures[ ] ;  Paresthesias[ ] ;Blurred vision [ ] ; Diplopia [ ] ; Vision changes [ ]   Ortho/Skin: Arthritis [ ] ; Joint pain [Y ]; Muscle pain [ ] ; Joint swelling [ ] ; Back Pain [ ] ; Rash [ ]   Psych: Depression[ ] ; Anxiety[ ]   Heme: Bleeding problems [ ] ; Clotting disorders [ ] ; Anemia [ ]   Endocrine: Diabetes [ ] ; Thyroid dysfunction[Y ]  Home Medications Prior to Admission medications   Medication Sig Start Date End Date Taking? Authorizing Provider  amiodarone (PACERONE) 200 MG tablet Take 300 mg by mouth daily. Take 1 and 1/2 tablets daily   Yes Historical Provider, MD  carvedilol (COREG) 3.125 MG tablet Take 3.125 mg by mouth 2 (two) times daily with a meal.     Yes Historical Provider, MD  esomeprazole (NEXIUM) 40 MG capsule Take 40 mg by mouth daily before breakfast.   Yes Historical Provider, MD  furosemide (LASIX) 20 MG tablet Take 20 mg by mouth 2 (two) times daily.    Yes Historical Provider, MD  levothyroxine (SYNTHROID, LEVOTHROID) 125 MCG tablet Take 125 mcg by mouth daily before breakfast.    Yes Historical Provider, MD  potassium chloride SA (K-DUR,KLOR-CON) 20 MEQ tablet Take 20 mEq by mouth every morning.    Yes Historical Provider, MD  ramipril (ALTACE) 2.5 MG tablet Take 2.5 mg by mouth every  evening.    Yes Historical Provider, MD  simvastatin (ZOCOR) 10 MG tablet Take 10 mg by mouth every evening.    Yes Historical Provider, MD  warfarin (COUMADIN) 5 MG tablet Take 2.5-5 mg by mouth every evening. Take one tab (5 mg) on Tues & Thurs & 1/2 tab (2.5mg ) on all other days   Yes Historical Provider, MD   Current Medications . amiodarone  300 mg Oral Daily  . carvedilol  3.125 mg Oral BID WC  . furosemide  40 mg Intravenous BID  . levothyroxine  125 mcg Oral QAC breakfast  . nitroGLYCERIN  1 inch Topical Q6H  . pantoprazole  40 mg Oral Daily  . ramipril  2.5 mg Oral QPM  . simvastatin  10 mg Oral QPM  . sodium chloride  3 mL Intravenous Q12H  . warfarin  2.5 mg Oral Custom  . [START ON  01/27/2013] warfarin  5 mg Oral Custom  . Warfarin - Physician Dosing Inpatient   Does not apply q1800    Past Medical History: Past Medical History  Diagnosis Date  . Nonischemic cardiomyopathy     ef 10 %  . BPH (benign prostatic hypertrophy)   . Hypothyroidism   . Left bundle branch block   . Atrial fibrillation     fibrillation/flutter  . Depression   . Sleep apnea     on CPAP.    Marland Kitchen Hyperlipidemia   . Thrombocytopenia   . Osteoarthritis   . CHF (congestive heart failure)     Past Surgical History: Past Surgical History  Procedure Laterality Date  . Transurethral resection of prostate      for BPH  . Cholecystectomy    . Colonscopy      reportedly negative per pt; around 2005.    . Cardioversion N/A 12/29/2012    Procedure: CARDIOVERSION;  Surgeon: Quintella Reichert, MD;  Location: [facility];  Service: Cardiovascular;  Laterality: N/A;    Family History: Family History  Problem Relation Age of Onset  . Heart failure Mother   . Heart disease Father   . Cancer Maternal Uncle     Unknown subtype    Social History: History   Social History  . Marital Status: Married    Spouse Name: N/A    Number of Children: 4  . Years of Education: N/A   Occupational History  .      retired Optometrist; now in Research [employer], political party   Social History Main Topics  . Smoking status: Never Smoker   . Smokeless tobacco: None  . Alcohol Use: No  . Drug Use: No  . Sexually Active: None   Other Topics Concern  . None   Social History Narrative  . None    Allergies:  No Known Allergies  Objective:    Vital Signs:   Temp:  [97.4 F (36.3 C)-98.4 F (36.9 C)] 97.4 F (36.3 C) (08/01 0511) Pulse Rate:  [70-92] 70 (08/01 0511) Resp:  [14-28] 20 (08/01 0511) BP: (110-147)/(67-98) 110/67 mmHg (08/01 0511) SpO2:  [89 %-97 %] 93 % (08/01 0511) Weight:  [230 lb 6.1 oz (104.5 kg)-232 lb 2.3 oz (105.3 kg)] 230 lb 6.1 oz (104.5 kg) (08/01 0511) Last BM Date:  01/22/13  Weight change: Filed Weights   01/22/13 2346 01/23/13 0511  Weight: 232 lb 2.3 oz (105.3 kg) 230 lb 6.1 oz (104.5 kg)    Intake/Output:   Intake/Output Summary (Last 24 hours) at 01/23/13 1240 Last data filed at  01/23/13 1610  Gross per 24 hour  Intake    403 ml  Output    400 ml  Net      3 ml     Physical Exam: General:  Chronically ill appearing. No resp difficulty HEENT: normal Neck: supple. JVP to jaw. Carotids 2+ bilat; no bruits. No lymphadenopathy or thryomegaly appreciated. Cor: PMI nondisplaced. Regular rate & rhythm. No rubs, gallops or murmurs. Lungs: clear Abdomen: soft, nontender, distended. No hepatosplenomegaly. No bruits or masses. Good bowel sounds. Extremities: no cyanosis, clubbing, rash, RLE LLE 1+ edema 1/3 up lower legs bilaterally Neuro: alert & orientedx3, cranial nerves grossly intact. moves all 4 extremities w/o difficulty. Affect pleasant  Telemetry: A-V sequential pacing  Labs: Basic Metabolic Panel:  Recent Labs Lab 01/22/13 1916 01/23/13 0600  NA 134* 137  K 4.0 3.7  CL 98 102  CO2 25 27  GLUCOSE 123* 112*  BUN 18 18  CREATININE 1.40* 1.32  CALCIUM 8.9 8.5  MG  --  2.1    Liver Function Tests:  Recent Labs Lab 01/22/13 1916  AST 25  ALT 14  ALKPHOS 98  BILITOT 1.0  PROT 7.4  ALBUMIN 3.7   No results found for this basename: LIPASE, AMYLASE,  in the last 168 hours No results found for this basename: AMMONIA,  in the last 168 hours  CBC:  Recent Labs Lab 01/22/13 1916  WBC 8.8  NEUTROABS 7.5  HGB 12.2*  HCT 37.2*  MCV 91.9  PLT 220    Cardiac Enzymes: No results found for this basename: CKTOTAL, CKMB, CKMBINDEX, TROPONINI,  in the last 168 hours  BNP: BNP (last 3 results)  Recent Labs  12/08/12 1634 01/22/13 1916  PROBNP 3648.0* 6466.0*    CBG: No results found for this basename: GLUCAP,  in the last 168 hours  Coagulation Studies:  Recent Labs  01/22/13 2026 01/23/13 0600  LABPROT  31.5* 32.6*  INR 3.19* 3.33*    Other results: Dg Chest 2 View  01/22/2013   *RADIOLOGY REPORT*  Clinical Data: Shortness of breath and cough.  CHEST - 2 VIEW  Comparison: 01/13/2013  Findings: Diffuse bilateral airspace disease is favored to represent acute pulmonary edema.  The heart remains moderately enlarged and there is stable appearance of a biventricular pacing / defibrillator device.  No significant pleural fluid is identified. There is no evidence of pulmonary consolidation.  Stable degenerative changes are present in the spine.  IMPRESSION: Diffuse pulmonary edema.   Original Report Authenticated By: Irish Lack, M.D.      Medications:     Current Medications: . amiodarone  300 mg Oral Daily  . carvedilol  3.125 mg Oral BID WC  . furosemide  40 mg Intravenous BID  . levothyroxine  125 mcg Oral QAC breakfast  . nitroGLYCERIN  1 inch Topical Q6H  . pantoprazole  40 mg Oral Daily  . ramipril  2.5 mg Oral QPM  . simvastatin  10 mg Oral QPM  . sodium chloride  3 mL Intravenous Q12H  . warfarin  2.5 mg Oral Custom  . [START ON 01/27/2013] warfarin  2.5-5 mg Oral Custom  . Warfarin - Physician Dosing Inpatient   Does not apply q1800     Infusions:      Assessment/Plan Mr   1. A/C Systolic Heart Failure- NYHA III-IV. NICM per cath 2008. CRT-D 2008. Significant volume overload noted. Start lasix drip 10 mg per hour. Continue carvedilol 3.125 mg twice a day. Continue ramipril 2.5  mg daily.  Add 0.125 mg daily. Start  Check ECHO.   2. A Fib Paroxysmal. Continue amiodarone 300 mg daily. On coumadin. Pharmacy to dose.   3. Hypothyroid TSH 4.005 01/08/13   4. OSA CPAP nightly prior to admit. Continue   Length of Stay: 1  CLEGG,AMY 01/23/2013, 12:40 PM  Advanced Heart Failure Team Pager [phone] (M-F; 7a - 4p)  Please contact [facility] for night-coverage after hours (4p -7a ) and weekends on [email or web address]  Patient seen with NP, agree with the above note.  1.  Acute on chronic systolic CHF: Nonischemic cardiomyopathy.  EF 15% per report, no echo in system.  Patient is very volume overloaded on exam today. He needs extensive diuresis.  BP is stable and creatinine is stable.  - Start Lasix gtt at 10 mg/hr, follow K and creatinine closely.  - Continue current Coreg and ramipril. - Will add digoxin 0.125 mg daily and spironolactone 12.5 mg daily.  - Echocardiogram.  2. Atrial arrhythmias: Recent DCCV for atrial tachycardia, also has had paroxysmal atrial fibrillation.  He is A-V sequentially paced currently.  He reports sensation of heart racing at home.  Will have Medtronic CRT-D device interrogated today.  Continue warfarin.  If interrogation shows paroxysmal atrial arrhythmias, increase amiodarone to 400 mg bid.  3. OSA: Continue CPAP.   Marca Ancona 01/23/2013 1:36 PM

## 2013-01-23 NOTE — Progress Notes (Signed)
ANTICOAGULATION CONSULT NOTE - Initial Consult  Pharmacy Consult for warfarin Indication: atrial fibrillation  No Known Allergies  Patient Measurements: Height: 6\' 2"  (188 cm) Weight: 230 lb 6.1 oz (104.5 kg) IBW/kg (Calculated) : 82.2  Vital Signs: Temp: 97.4 F (36.3 C) (08/01 0511) Temp src: Oral (08/01 0511) BP: 110/67 mmHg (08/01 0511) Pulse Rate: 70 (08/01 0511)  Labs:  Recent Labs  01/22/13 1916 01/22/13 2026 01/23/13 0600  HGB 12.2*  --   --   HCT 37.2*  --   --   PLT 220  --   --   LABPROT  --  31.5* 32.6*  INR  --  3.19* 3.33*  CREATININE 1.40*  --  1.32    Estimated Creatinine Clearance: 59.4 ml/min (by C-G formula based on Cr of 1.32).   Medical History: Past Medical History  Diagnosis Date  . Nonischemic cardiomyopathy     ef 10 %  . BPH (benign prostatic hypertrophy)   . Hypothyroidism   . Left bundle branch block   . Atrial fibrillation     fibrillation/flutter  . Depression   . Sleep apnea     on CPAP.    Marland Kitchen Hyperlipidemia   . Thrombocytopenia   . Osteoarthritis   . CHF (congestive heart failure)   . Acute on chronic systolic heart failure     Medications:  Scheduled:  . amiodarone  300 mg Oral Daily  . carvedilol  3.125 mg Oral BID WC  . furosemide  40 mg Intravenous BID  . levothyroxine  125 mcg Oral QAC breakfast  . nitroGLYCERIN  1 inch Topical Q6H  . pantoprazole  40 mg Oral Daily  . ramipril  2.5 mg Oral QPM  . simvastatin  10 mg Oral QPM  . sodium chloride  3 mL Intravenous Q12H    Assessment: 80 YOM with hx of CHF brought in with increased SOB and LE edema. Pharmacy asked to take over warfarin dosing. INR SUPRAtherapeutic on admit at 3.19, and increased to 3.33 today. Home dose of warfarin is 2.5mg  daily except 5mg  on Tuesdays and Thursdays. His H/H is slightly low, plts WNL. Patient does have a bruise on his hip and some elbow swelling after a pt- reported fall.   Goal of Therapy:  INR 2-3 Monitor platelets by  anticoagulation protocol: Yes   Plan:  1. Hold warfarin tonight 2. Daily PT/INR 3. Daily CBC 4. Follow for any other reports of bruising or bleeding  Vergie Zahm D. Tavyn Kurka, PharmD Clinical Pharmacist Pager: [phone] 01/23/2013 1:41 PM

## 2013-01-23 NOTE — Plan of Care (Signed)
Problem: Phase I Progression Outcomes Goal: Hemodynamically stable Outcome: Progressing Patient admitted to floor from [facility] ED for further management of shortness of breath.  Arrived to unit via stretcher, in no apparent distress, capable of ambulating to bed with stand by assistance.  Requires O2 at 3L for comfort.  Placed on telemetry and oriented to room and floor procedures.  Bed alarm activated.  Observed swelling to left elbow on arrival, patient states he had a fall prior to arrival to unit.  Resting quietly at present.  Will continue to monitor patient condition.

## 2013-01-23 NOTE — Progress Notes (Signed)
Utilization Review Completed Wendell Nicoson J. Lonny Eisen, RN, BSN, NCM [phone]  

## 2013-01-23 NOTE — Progress Notes (Signed)
*  PRELIMINARY RESULTS* Echocardiogram 2D Echocardiogram has been performed.  [name] [name] 01/23/2013, 2:11 PM

## 2013-01-23 NOTE — Progress Notes (Signed)
Mild bruising to left hip.  Swelling to left elbow.  Down for xray to left elbow

## 2013-01-23 NOTE — Plan of Care (Signed)
Problem: Phase I Progression Outcomes Goal: EF % per last Echo/documented,Core Reminder form on chart Outcome: Completed/Met Date Met:  01/23/13 EF=25-30% per echo

## 2013-01-23 NOTE — Progress Notes (Signed)
[employer]tr[employer] interrogated his device.   Fluid trending up close tothresh-hold. Now on lasix drip at 10 mg per hour.   He has had one episode less than one minute of  A-Fib since DC-CV. On 12/29/12.   Continue current dose of amiodarone.   CLEGG,AMY 3:57 PM

## 2013-01-24 LAB — PROTIME-INR
INR: 3.12 — ABNORMAL HIGH (ref 0.00–1.49)
Prothrombin Time: 31 seconds — ABNORMAL HIGH (ref 11.6–15.2)

## 2013-01-24 LAB — BASIC METABOLIC PANEL
CO2: 29 mEq/L (ref 19–32)
Calcium: 8.4 mg/dL (ref 8.4–10.5)
Chloride: 97 mEq/L (ref 96–112)
Sodium: 139 mEq/L (ref 135–145)

## 2013-01-24 NOTE — Progress Notes (Signed)
Lung sounds are clearer today with better air exchange.  No audible wheezing noted.

## 2013-01-24 NOTE — Progress Notes (Signed)
Subjective:  Says he feels better today. No orthopnea or PND last night. Heart failure consult appreciated.  Objective:  Vital Signs in the last 24 hours: BP 109/56  Pulse 70  Temp(Src) 98 F (36.7 C) (Oral)  Resp 18  Ht 6\' 2"  (1.88 m)  Wt 100.7 kg (222 lb 0.1 oz)  BMI 28.49 kg/m2  SpO2 94%  Physical Exam: Elderly white male in no acute distress Lungs:  Clear  Cardiac:  Regular rhythm, normal S1 and S2, no S3, jugular venous pressure is elevated. Abdomen:  Soft, nontender, no masses Extremities: 1-2+ edema edema present  Intake/Output from previous day: 08/01 0701 - 08/02 0700 In: 1104.5 [P.O.:960; I.V.:144.5] Out: 4325 [Urine:4325] Weight Filed Weights   01/22/13 2346 01/23/13 0511 01/24/13 0559  Weight: 105.3 kg (232 lb 2.3 oz) 104.5 kg (230 lb 6.1 oz) 100.7 kg (222 lb 0.1 oz)    Lab Results: Basic Metabolic Panel:  Recent Labs  19/14/78 0600 01/24/13 0618  NA 137 139  K 3.7 3.0*  CL 102 97  CO2 27 29  GLUCOSE 112* 112*  BUN 18 19  CREATININE 1.32 1.25    CBC:  Recent Labs  01/22/13 1916  WBC 8.8  NEUTROABS 7.5  HGB 12.2*  HCT 37.2*  MCV 91.9  PLT 220    BNP    Component Value Date/Time   PROBNP 6466.0* 01/22/2013 1916    PROTIME: Lab Results  Component Value Date   INR 3.12* 01/24/2013   INR 3.33* 01/23/2013   INR 3.19* 01/22/2013    Telemetry: AV paced rhythm  Assessment/Plan: 1. Acute on chronic systolic congestive heart failure 2. History of atrial arrhythmias 3. Obstructive sleep apnea  Recommendations:  Weight is down 10 pounds from admission. I would continue intravenous diuresis over the weekend. Lab work stable at present.      Darden Palmer  MD [facility]  01/24/2013, 11:29 AM

## 2013-01-24 NOTE — Progress Notes (Signed)
ANTICOAGULATION CONSULT NOTE - Follow up Consult  Pharmacy Consult for warfarin Indication: atrial fibrillation  No Known Allergies  Patient Measurements: Height: 6\' 2"  (188 cm) Weight: 222 lb 0.1 oz (100.7 kg) IBW/kg (Calculated) : 82.2  Vital Signs: Temp: 98 F (36.7 C) (08/02 0559) Temp src: Oral (08/02 0559) BP: 109/56 mmHg (08/02 0559) Pulse Rate: 70 (08/02 1054)  Labs:  Recent Labs  01/22/13 1916 01/22/13 2026 01/23/13 0600 01/24/13 0618  HGB 12.2*  --   --   --   HCT 37.2*  --   --   --   PLT 220  --   --   --   LABPROT  --  31.5* 32.6* 31.0*  INR  --  3.19* 3.33* 3.12*  CREATININE 1.40*  --  1.32 1.25    Estimated Creatinine Clearance: 61.7 ml/min (by C-G formula based on Cr of 1.25).   Medications:  Scheduled:  . amiodarone  300 mg Oral Daily  . carvedilol  3.125 mg Oral BID WC  . digoxin  0.125 mg Oral Daily  . levothyroxine  125 mcg Oral QAC breakfast  . nitroGLYCERIN  1 inch Topical Q6H  . pantoprazole  40 mg Oral Daily  . ramipril  2.5 mg Oral QPM  . simvastatin  10 mg Oral QPM  . sodium chloride  3 mL Intravenous Q12H  . Warfarin - Pharmacist Dosing Inpatient   Does not apply q1800    Assessment: 12 YOM with hx of CHF brought in with increased SOB and LE edema. Pharmacy asked to take over warfarin dosing. INR remains SUPRAtherapeutic at 3.12 today. Home dose of warfarin is 2.5mg  daily except 5mg  on Tuesdays and Thursdays. His H/H is slightly low, plts WNL. Patient does have a bruise on his hip and some elbow swelling after a pt- reported fall- no notation of this worsening.  Goal of Therapy:  INR 2-3 Monitor platelets by anticoagulation protocol: Yes   Plan:  1. Hold warfarin tonight 2. Daily PT/INR 3. CBC tomorrow 4. Follow for any other reports of bruising or bleeding  Brandey Vandalen D. Moncerrat Burnstein, PharmD Clinical Pharmacist Pager: [phone] 01/24/2013 12:49 PM

## 2013-01-25 LAB — CBC
MCH: 29.8 pg (ref 26.0–34.0)
MCHC: 32.7 g/dL (ref 30.0–36.0)
Platelets: 200 10*3/uL (ref 150–400)
RBC: 3.76 MIL/uL — ABNORMAL LOW (ref 4.22–5.81)

## 2013-01-25 LAB — BASIC METABOLIC PANEL
Chloride: 94 mEq/L — ABNORMAL LOW (ref 96–112)
Creatinine, Ser: 1.28 mg/dL (ref 0.50–1.35)
GFR calc Af Amer: 60 mL/min — ABNORMAL LOW (ref >90)
Potassium: 2.9 mEq/L — ABNORMAL LOW (ref 3.5–5.1)

## 2013-01-25 LAB — PROTIME-INR: Prothrombin Time: 28.4 seconds — ABNORMAL HIGH (ref 11.6–15.2)

## 2013-01-25 MED ORDER — WARFARIN SODIUM 2.5 MG PO TABS
2.5000 mg | ORAL_TABLET | Freq: Once | ORAL | Status: AC
  Administered 2013-01-25: 2.5 mg via ORAL
  Filled 2013-01-25: qty 1

## 2013-01-25 MED ORDER — POTASSIUM CHLORIDE CRYS ER 20 MEQ PO TBCR
40.0000 meq | EXTENDED_RELEASE_TABLET | Freq: Two times a day (BID) | ORAL | Status: AC
  Administered 2013-01-25 (×2): 40 meq via ORAL
  Filled 2013-01-25 (×2): qty 2

## 2013-01-25 MED ORDER — GUAIFENESIN-DM 100-10 MG/5ML PO SYRP
5.0000 mL | ORAL_SOLUTION | ORAL | Status: DC | PRN
  Administered 2013-01-25 – 2013-01-26 (×3): 5 mL via ORAL
  Filled 2013-01-25 (×3): qty 5

## 2013-01-25 NOTE — Progress Notes (Signed)
ANTICOAGULATION CONSULT NOTE - Follow up Consult  Pharmacy Consult for warfarin Indication: atrial fibrillation  No Known Allergies  Patient Measurements: Height: 6\' 2"  (188 cm) Weight: 220 lb 1.6 oz (99.837 kg) (Scale B) IBW/kg (Calculated) : 82.2  Vital Signs: Temp: 98.9 F (37.2 C) (08/03 0531) Temp src: Oral (08/03 0531) BP: 98/50 mmHg (08/03 0926) Pulse Rate: 69 (08/03 0920)  Labs:  Recent Labs  01/22/13 1916  01/23/13 0600 01/24/13 0618 01/25/13 0540  HGB 12.2*  --   --   --  11.2*  HCT 37.2*  --   --   --  34.2*  PLT 220  --   --   --  200  LABPROT  --   < > 32.6* 31.0* 28.4*  INR  --   < > 3.33* 3.12* 2.78*  CREATININE 1.40*  --  1.32 1.25 1.28  < > = values in this interval not displayed.  Estimated Creatinine Clearance: 60 ml/min (by C-G formula based on Cr of 1.28).   Medications:  Scheduled:  . amiodarone  300 mg Oral Daily  . carvedilol  3.125 mg Oral BID WC  . digoxin  0.125 mg Oral Daily  . levothyroxine  125 mcg Oral QAC breakfast  . nitroGLYCERIN  1 inch Topical Q6H  . pantoprazole  40 mg Oral Daily  . potassium chloride  40 mEq Oral BID  . ramipril  2.5 mg Oral QPM  . simvastatin  10 mg Oral QPM  . sodium chloride  3 mL Intravenous Q12H  . Warfarin - Pharmacist Dosing Inpatient   Does not apply q1800    Assessment: 34 YOM with hx of CHF brought in with increased SOB and LE edema. Pharmacy asked to take over warfarin dosing. INR fell to within therapeutic range today at 2.78. His H/H is slightly low, plts WNL. Patient does have a bruise on his hip and some elbow swelling after a pt- reported fall- no notation of this worsening.  Goal of Therapy:  INR 2-3 Monitor platelets by anticoagulation protocol: Yes   Plan:  1. Warfarin 2.5mg  po x1 tonight 2. Daily PT/INR 3. Follow for any other reports of bruising or bleeding  Leontine Radman D. Deegan Valentino, PharmD Clinical Pharmacist Pager: [phone] 01/25/2013 11:38 AM

## 2013-01-25 NOTE — Progress Notes (Signed)
Pt educated on heart failure, booklet given, daughter at bedside, also informed patient on heart failure video, and both patient and daughter watched the video.

## 2013-01-25 NOTE — Progress Notes (Signed)
Subjective:  Complains of a lot of urinary frequency, no shortness of breath orthopnea. Weight is coming down significantly. Significantly hypokalemic.  Objective:  Vital Signs in the last 24 hours: BP 98/50  Pulse 69  Temp(Src) 98.9 F (37.2 C) (Oral)  Resp 18  Ht 6\' 2"  (1.88 m)  Wt 99.837 kg (220 lb 1.6 oz)  BMI 28.25 kg/m2  SpO2 94%  Physical Exam: Elderly white male in no acute distress Lungs:  Clear  Cardiac:  Regular rhythm, normal S1 and S2, no S3, jugular venous pressure is elevated. Abdomen:  Soft, nontender, no masses Extremities: 1-2+ edema edema present  Intake/Output from previous day: 08/02 0701 - 08/03 0700 In: 1548 [P.O.:1298; I.V.:250] Out: 1825 [Urine:1825] Weight Filed Weights   01/23/13 0511 01/24/13 0559 01/25/13 0531  Weight: 104.5 kg (230 lb 6.1 oz) 100.7 kg (222 lb 0.1 oz) 99.837 kg (220 lb 1.6 oz)    Lab Results: Basic Metabolic Panel:  Recent Labs  96/04/54 0618 01/25/13 0540  NA 139 135  K 3.0* 2.9*  CL 97 94*  CO2 29 30  GLUCOSE 112* 109*  BUN 19 19  CREATININE 1.25 1.28    CBC:  Recent Labs  01/22/13 1916 01/25/13 0540  WBC 8.8 7.8  NEUTROABS 7.5  --   HGB 12.2* 11.2*  HCT 37.2* 34.2*  MCV 91.9 91.0  PLT 220 200    BNP    Component Value Date/Time   PROBNP 6466.0* 01/22/2013 1916    PROTIME: Lab Results  Component Value Date   INR 2.78* 01/25/2013   INR 3.12* 01/24/2013   INR 3.33* 01/23/2013    Telemetry: AV paced rhythm  Assessment/Plan: 1. Acute on chronic systolic congestive heart failure 2. History of atrial arrhythmias 3. Obstructive sleep apnea 4. Hypokalemia  Recommendations:  Replete potassium, intravenous diuresis persists. Continue warfarin.      [name] Palmer  MD [facility]  01/25/2013, 11:32 AM

## 2013-01-25 NOTE — Progress Notes (Signed)
Summary; Pt started on 40 of K 2x, Pt requested PRN of cough. Amiodarone giving SBP in 90s Ok per cards. Education of fluid restrictions and daily weights discussed unsure if Pt has complete understanding at this Time. HF booklet in room

## 2013-01-25 NOTE — Progress Notes (Signed)
Pt setting in chair. No complaints of CP or SOB. Pt amiodarone held d/t sbp < 100. Pt k 2.9. Will notifiy MD

## 2013-01-26 LAB — PROTIME-INR: Prothrombin Time: 20.5 seconds — ABNORMAL HIGH (ref 11.6–15.2)

## 2013-01-26 LAB — BASIC METABOLIC PANEL
BUN: 20 mg/dL (ref 6–23)
Calcium: 8.7 mg/dL (ref 8.4–10.5)
Creatinine, Ser: 1.32 mg/dL (ref 0.50–1.35)
GFR calc Af Amer: 58 mL/min — ABNORMAL LOW (ref >90)
GFR calc non Af Amer: 50 mL/min — ABNORMAL LOW (ref >90)
Potassium: 3.3 mEq/L — ABNORMAL LOW (ref 3.5–5.1)

## 2013-01-26 MED ORDER — WARFARIN SODIUM 5 MG PO TABS
5.0000 mg | ORAL_TABLET | Freq: Once | ORAL | Status: AC
  Administered 2013-01-26: 5 mg via ORAL
  Filled 2013-01-26: qty 1

## 2013-01-26 MED ORDER — POTASSIUM CHLORIDE CRYS ER 20 MEQ PO TBCR
40.0000 meq | EXTENDED_RELEASE_TABLET | Freq: Once | ORAL | Status: AC
  Administered 2013-01-26: 40 meq via ORAL

## 2013-01-26 MED ORDER — TORSEMIDE 20 MG PO TABS
40.0000 mg | ORAL_TABLET | Freq: Every day | ORAL | Status: DC
  Filled 2013-01-26: qty 2

## 2013-01-26 MED ORDER — TORSEMIDE 20 MG PO TABS
60.0000 mg | ORAL_TABLET | Freq: Every day | ORAL | Status: DC
  Administered 2013-01-26 – 2013-01-27 (×2): 60 mg via ORAL
  Filled 2013-01-26 (×2): qty 3

## 2013-01-26 MED ORDER — SPIRONOLACTONE 12.5 MG HALF TABLET
12.5000 mg | ORAL_TABLET | Freq: Every day | ORAL | Status: DC
  Administered 2013-01-26 – 2013-01-27 (×2): 12.5 mg via ORAL
  Filled 2013-01-26 (×2): qty 1

## 2013-01-26 NOTE — Progress Notes (Signed)
CARDIAC REHAB PHASE I   PRE:  Rate/Rhythm: 70 paced    BP: sitting 90/58    SaO2: 95 RA  MODE:  Ambulation: 360 ft   POST:  Rate/Rhythm: 79 paced    BP: sitting 122/64     SaO2: 92 RA  Tolerated fairly well. Increased distance from last night, some SOB. BP improved with walking. Discussed low sodium in depth. Pt struggles with dietary indiscretions due to he nor his wife cooking much.  Discussed HF booklet and new ways of preparing food. Dghter present who is supportive. [phone]  Elissa Lovett [place] CES, ACSM 01/26/2013 3:31 PM

## 2013-01-26 NOTE — Progress Notes (Signed)
ANTICOAGULATION CONSULT NOTE - Follow up Consult  Pharmacy Consult for warfarin Indication: atrial fibrillation  No Known Allergies  Patient Measurements: Height: 6\' 2"  (188 cm) Weight: 218 lb 11.1 oz (99.2 kg) IBW/kg (Calculated) : 82.2  Vital Signs: Temp: 98.6 F (37 C) (08/04 0617) Temp src: Oral (08/04 0617) BP: 100/58 mmHg (08/04 1053) Pulse Rate: 70 (08/04 1053)  Labs:  Recent Labs  01/24/13 0618 01/25/13 0540 01/26/13 0505 01/26/13 1032  HGB  --  11.2*  --   --   HCT  --  34.2*  --   --   PLT  --  200  --   --   LABPROT 31.0* 28.4* 20.5*  --   INR 3.12* 2.78* 1.82*  --   CREATININE 1.25 1.28  --  1.32    Estimated Creatinine Clearance: 58.1 ml/min (by C-G formula based on Cr of 1.32).   Medications:  Scheduled:  . amiodarone  300 mg Oral Daily  . carvedilol  3.125 mg Oral BID WC  . digoxin  0.125 mg Oral Daily  . levothyroxine  125 mcg Oral QAC breakfast  . pantoprazole  40 mg Oral Daily  . ramipril  2.5 mg Oral QPM  . simvastatin  10 mg Oral QPM  . sodium chloride  3 mL Intravenous Q12H  . spironolactone  12.5 mg Oral Daily  . torsemide  60 mg Oral Daily  . Warfarin - Pharmacist Dosing Inpatient   Does not apply q1800    Assessment: 89 YOM with hx of CHF brought in with increased SOB and LE edema. Pharmacy asked to take over warfarin dosing. Home dose of warfarin is 2.5mg  daily except 5mg  on Tuesdays and Thursdays. His H/H is slightly low, plts WNL. Patient does have a bruise on his hip and some elbow swelling after a pt- reported fall- no notation of this worsening. Received 2.5mg  po last night. INR today SUBtherapeutic at 1.82.   Goal of Therapy:  INR 2-3 Monitor platelets by anticoagulation protocol: Yes   Plan:  1. Warfarin 5 mg po x 1 2. Daily PT/INR 3. Follow for any other reports of bruising or bleeding  Margie Billet, PharmD Clinical Pharmacist - Resident Pager: [phone] Pharmacy: [phone] 01/26/2013 11:44 AM

## 2013-01-26 NOTE — Progress Notes (Addendum)
Advanced Heart Failure Rounding Note   Subjective:    Over the weekend he continued on lasix drip at 10 mg per hour. Overall his weight is down 14 pounds.   Denies SOB/PND/Orthopnea.    Objective:   Weight Range:  Vital Signs:   Temp:  [97.1 F (36.2 C)-98.7 F (37.1 C)] 98.6 F (37 C) (08/04 0617) Pulse Rate:  [69-72] 71 (08/04 0617) Resp:  [18-20] 20 (08/04 0617) BP: (94-116)/(47-62) 109/61 mmHg (08/04 0617) SpO2:  [91 %-94 %] 91 % (08/04 0617) Weight:  [218 lb 11.1 oz (99.2 kg)] 218 lb 11.1 oz (99.2 kg) (08/04 0617) Last BM Date: 01/25/13  Weight change: Filed Weights   01/24/13 0559 01/25/13 0531 01/26/13 0617  Weight: 222 lb 0.1 oz (100.7 kg) 220 lb 1.6 oz (99.837 kg) 218 lb 11.1 oz (99.2 kg)    Intake/Output:   Intake/Output Summary (Last 24 hours) at 01/26/13 0908 Last data filed at 01/26/13 0827  Gross per 24 hour  Intake   2100 ml  Output   2750 ml  Net   -650 ml    Physical Exam:  General: Chronically ill appearing. No resp difficulty  HEENT: normal  Neck: supple. JVP 5-6. Carotids 2+ bilat; no bruits. No lymphadenopathy or thryomegaly appreciated.  Cor: PMI nondisplaced. Regular rate & rhythm. No rubs, gallops or murmurs.  Lungs: clear  Abdomen: soft, nontender, nondistended. No hepatosplenomegaly. No bruits or masses. Good bowel sounds.  Extremities: no cyanosis, clubbing, rash, no lower extremity edema.  Neuro: alert & orientedx3, cranial nerves grossly intact. moves all 4 extremities w/o difficulty. Affect pleasant   Telemetry: A-V sequential pacing  Labs: Basic Metabolic Panel:  Recent Labs Lab 01/22/13 1916 01/23/13 0600 01/24/13 0618 01/25/13 0540  NA 134* 137 139 135  K 4.0 3.7 3.0* 2.9*  CL 98 102 97 94*  CO2 25 27 29 30   GLUCOSE 123* 112* 112* 109*  BUN 18 18 19 19   CREATININE 1.40* 1.32 1.25 1.28  CALCIUM 8.9 8.5 8.4 8.2*  MG  --  2.1  --   --     Liver Function Tests:  Recent Labs Lab 01/22/13 1916  AST 25  ALT 14   ALKPHOS 98  BILITOT 1.0  PROT 7.4  ALBUMIN 3.7   No results found for this basename: LIPASE, AMYLASE,  in the last 168 hours No results found for this basename: AMMONIA,  in the last 168 hours  CBC:  Recent Labs Lab 01/22/13 1916 01/25/13 0540  WBC 8.8 7.8  NEUTROABS 7.5  --   HGB 12.2* 11.2*  HCT 37.2* 34.2*  MCV 91.9 91.0  PLT 220 200    Cardiac Enzymes: No results found for this basename: CKTOTAL, CKMB, CKMBINDEX, TROPONINI,  in the last 168 hours  BNP: BNP (last 3 results)  Recent Labs  12/08/12 1634 01/22/13 1916  PROBNP 3648.0* 6466.0*     Other results: Imaging:  No results found.   Medications:     Scheduled Medications: . amiodarone  300 mg Oral Daily  . carvedilol  3.125 mg Oral BID WC  . digoxin  0.125 mg Oral Daily  . levothyroxine  125 mcg Oral QAC breakfast  . nitroGLYCERIN  1 inch Topical Q6H  . pantoprazole  40 mg Oral Daily  . ramipril  2.5 mg Oral QPM  . simvastatin  10 mg Oral QPM  . sodium chloride  3 mL Intravenous Q12H  . Warfarin - Pharmacist Dosing Inpatient   Does not apply [phone]  Infusions: . furosemide (LASIX) infusion 10 mg/hr (01/25/13 2010)     PRN Medications:  sodium chloride, acetaminophen, guaiFENesin-dextromethorphan, ondansetron (ZOFRAN) IV, sodium chloride     Assessment/Pman  1. Acute on chronic systolic CHF: Nonischemic cardiomyopathy. 01/24/13 EF 20%. Has Medtronic CRT device. - 01/23/13 Started on  Lasix gtt at 10 mg/hr. Overall his weight is down 14 pounds. Dry weight 218 pounds.  Appears euvolemic. Stop lasix drip and start torsemide 60 mg po daily.   - Continue current Coreg and ramipril.  - Continue digoxin 0.125 mg daily and spironolactone 12.5 mg daily.  - BMET pending.  - Consult cardiac rehab.  - Consult dietitian 2. Atrial arrhythmias: Recent DCCV for atrial tachycardia, also has had paroxysmal atrial fibrillation. He is A-V sequentially paced currently. Continue amiodarone 400 mg bid. On  coumadin. INR 1.8 Pharmacy to dose 3. OSA: Continue CPAP   Length of Stay: 4   CLEGG,AMY NP-C 01/26/2013, 9:08 AM  Advanced Heart Failure Team Pager [phone] (M-F; 7a - 4p)  Please contact [facility] for night-coverage after hours (4p -7a ) and weekends on [email or web address]  Patient seen with NP, agree with the above note.  Patient doing much better, appears euvolemic.  Will transition to po torsemide.  Will need to gradually uptitrate ACEI and beta blocker, will have him followup in CHF clinic.  Will try to increase Coreg to 6.25 mg bid.   Marca Ancona 01/26/2013 10:00 AM

## 2013-01-26 NOTE — Progress Notes (Signed)
Summary: Pt output down today, No complaints of SOB or CP. Pt started on demadex 60mg  daily and spiro. Cardio Rehab walked Pt today.

## 2013-01-26 NOTE — Plan of Care (Signed)
Problem: Limited Adherence to Nutrition-Related Recommendations (NB-1.6) Goal: Nutrition education Formal process to instruct or train a patient/client in a skill or to impart knowledge to help patients/clients voluntarily manage or modify food choices and eating behavior to maintain or improve health. Outcome: Completed/Met Date Met:  01/26/13 Nutrition Education Note  RD consulted for nutrition education regarding new onset CHF.  RD provided "Low Sodium Nutrition Therapy" handout from the Academy of [employer]etics. Reviewed patient's dietary recall. Provided examples on ways to decrease sodium intake in diet. Discouraged intake of processed foods and use of salt shaker. Encouraged fresh fruits and vegetables as well as whole grain sources of carbohydrates to maximize fiber intake.   RD discussed why it is important for patient to adhere to diet recommendations, and emphasized the role of fluids, foods to avoid, and importance of weighing self daily. Teach back method used.  Expect good compliance.  Body mass index is 28.07 kg/(m^2). Pt meets criteria for overweight based on current BMI.  Current diet order is Heart Healthy, patient is consuming approximately 100% of meals at this time. Labs and medications reviewed. No further nutrition interventions warranted at this time. RD contact information provided. If additional nutrition issues arise, please re-consult RD.   Jarold Motto MS, RD, LDN Pager: [phone] After-hours pager: [phone]

## 2013-01-26 NOTE — Progress Notes (Signed)
SUBJECTIVE:  Feeling much better  OBJECTIVE:   Vitals:   Filed Vitals:   01/25/13 0926 01/25/13 1425 01/25/13 2122 01/26/13 0617  BP: 98/50 104/58 116/62 109/61  Pulse:  71 72 71  Temp:  97.1 F (36.2 C) 98.7 F (37.1 C) 98.6 F (37 C)  TempSrc:  Oral Oral Oral  Resp:  19 19 20   Height:      Weight:    99.2 kg (218 lb 11.1 oz)  SpO2:  94% 91% 91%   I&O's:   Intake/Output Summary (Last 24 hours) at 01/26/13 0913 Last data filed at 01/26/13 0827  Gross per 24 hour  Intake   2100 ml  Output   2750 ml  Net   -650 ml   TELEMETRY: Reviewed telemetry pt in NSR:     PHYSICAL EXAM General: Well developed, well nourished, in no acute distress Head: Eyes PERRLA, No xanthomas.   Normal cephalic and atramatic  Lungs:   Clear bilaterally to auscultation and percussion. Heart:   HRRR S1 S2 Pulses are 2+ & equal. Abdomen: Bowel sounds are positive, abdomen soft and non-tender without masses  Extremities:   No clubbing, cyanosis or edema.  DP +1 Neuro: Alert and oriented X 3. Psych:  Good affect, responds appropriately   LABS: Basic Metabolic Panel:  Recent Labs  16/10/96 0618 01/25/13 0540  NA 139 135  K 3.0* 2.9*  CL 97 94*  CO2 29 30  GLUCOSE 112* 109*  BUN 19 19  CREATININE 1.25 1.28  CALCIUM 8.4 8.2*   Liver Function Tests: No results found for this basename: AST, ALT, ALKPHOS, BILITOT, PROT, ALBUMIN,  in the last 72 hours No results found for this basename: LIPASE, AMYLASE,  in the last 72 hours CBC:  Recent Labs  01/25/13 0540  WBC 7.8  HGB 11.2*  HCT 34.2*  MCV 91.0  PLT 200    Lab Results  Component Value Date   INR 1.82* 01/26/2013   INR 2.78* 01/25/2013   INR 3.12* 01/24/2013    RADIOLOGY: Dg Chest 2 View  01/22/2013   *RADIOLOGY REPORT*  Clinical Data: Shortness of breath and cough.  CHEST - 2 VIEW  Comparison: 01/13/2013  Findings: Diffuse bilateral airspace disease is favored to represent acute pulmonary edema.  The heart remains moderately  enlarged and there is stable appearance of a biventricular pacing / defibrillator device.  No significant pleural fluid is identified. There is no evidence of pulmonary consolidation.  Stable degenerative changes are present in the spine.  IMPRESSION: Diffuse pulmonary edema.   Original Report Authenticated By: Irish Lack, M.D.   Dg Elbow 2 Views Left  01/23/2013   *RADIOLOGY REPORT*  Clinical Data: Left elbow pain and swelling post fall  LEFT ELBOW - 2 VIEW  Comparison: None.  Findings: Two views of the left elbow submitted.  No acute fracture or subluxation.  There is dorsal soft tissue swelling.  No posterior fat pad sign.  The  IMPRESSION: No acute fracture or subluxation.  Dorsal soft tissue swelling.   Original Report Authenticated By: Natasha Mead, M.D.   Assessment:  1. Acute on chronic systolic congestive heart failure - net 4L out and 6 kg down from admission 2. History of atrial fibrillation s/p recent DCCV maintaining NSR 3. Obstructive sleep apnea  4. Hypokalemia  5. Nonischemic DCM s/p BiV AICD 6. Systemic anticoagulation with sub therapeutic INR  Plan: 1.  Check BMET and replete potassium as needed 2.  D/C IV Lasix and start  Torsemide (pt states higher doses of lasix were not working at home) 3.  Coumadin dosing per pharmacy 4.  Home in am if diuresing well on Caroline More, MD  01/26/2013  9:13 AM

## 2013-01-27 LAB — BASIC METABOLIC PANEL
CO2: 32 mEq/L (ref 19–32)
Chloride: 96 mEq/L (ref 96–112)
GFR calc Af Amer: 57 mL/min — ABNORMAL LOW (ref >90)
Potassium: 3.5 mEq/L (ref 3.5–5.1)
Sodium: 139 mEq/L (ref 135–145)

## 2013-01-27 LAB — PROTIME-INR
INR: 1.57 — ABNORMAL HIGH (ref 0.00–1.49)
Prothrombin Time: 18.3 seconds — ABNORMAL HIGH (ref 11.6–15.2)

## 2013-01-27 MED ORDER — TORSEMIDE 20 MG PO TABS: 60.0000 mg | ORAL_TABLET | Freq: Every day | ORAL | Status: DC

## 2013-01-27 MED ORDER — CARVEDILOL 6.25 MG PO TABS: 6.2500 mg | ORAL_TABLET | Freq: Two times a day (BID) | ORAL | Status: DC

## 2013-01-27 MED ORDER — WARFARIN SODIUM 5 MG PO TABS
5.0000 mg | ORAL_TABLET | Freq: Once | ORAL | Status: DC
  Filled 2013-01-27: qty 1

## 2013-01-27 MED ORDER — DIGOXIN 125 MCG PO TABS: 0.1250 mg | ORAL_TABLET | Freq: Every day | ORAL | Status: DC

## 2013-01-27 MED ORDER — WARFARIN SODIUM 7.5 MG PO TABS
7.5000 mg | ORAL_TABLET | Freq: Once | ORAL | Status: DC
  Filled 2013-01-27: qty 1

## 2013-01-27 MED ORDER — CARVEDILOL 6.25 MG PO TABS
6.2500 mg | ORAL_TABLET | Freq: Two times a day (BID) | ORAL | Status: DC
  Filled 2013-01-27 (×2): qty 1

## 2013-01-27 MED ORDER — SPIRONOLACTONE 12.5 MG HALF TABLET: ORAL_TABLET | ORAL | Status: DC

## 2013-01-27 MED ORDER — WARFARIN SODIUM 5 MG PO TABS: ORAL_TABLET | ORAL | Status: DC

## 2013-01-27 MED ORDER — SPIRONOLACTONE 12.5 MG HALF TABLET: 12.5000 mg | ORAL_TABLET | Freq: Every day | ORAL | Status: DC

## 2013-01-27 NOTE — Progress Notes (Signed)
Pt voiding on and off on urinal without difficulty.tele AV paced. No c/o chest pains nor SOB.

## 2013-01-27 NOTE — Progress Notes (Signed)
Patient ID: [name] [name], male   DOB: [birth year], 79 y.o.   MRN: [id number] Advanced Heart Failure Rounding Note   Subjective:    Weight continues to fall.  Stable this morning without dyspnea.   Denies SOB/PND/Orthopnea.    Objective:   Weight Range:  Vital Signs:   Temp:  [98.1 F (36.7 C)-98.3 F (36.8 C)] 98.1 F (36.7 C) (08/05 0600) Pulse Rate:  [70-72] 72 (08/05 0600) Resp:  [17-18] 17 (08/05 0600) BP: (90-105)/(57-63) 103/63 mmHg (08/05 0600) SpO2:  [92 %-94 %] 92 % (08/05 0600) Weight:  [97.07 kg (214 lb)] 97.07 kg (214 lb) (08/05 0600) Last BM Date: 01/25/13  Weight change: Filed Weights   01/25/13 0531 01/26/13 0617 01/27/13 0600  Weight: 99.837 kg (220 lb 1.6 oz) 99.2 kg (218 lb 11.1 oz) 97.07 kg (214 lb)    Intake/Output:   Intake/Output Summary (Last 24 hours) at 01/27/13 0754 Last data filed at 01/27/13 0600  Gross per 24 hour  Intake   1860 ml  Output   1750 ml  Net    110 ml    Physical Exam:  General: Chronically ill appearing. No resp difficulty  HEENT: normal  Neck: supple. JVP 5-6. Carotids 2+ bilat; no bruits. No lymphadenopathy or thryomegaly appreciated.  Cor: PMI nondisplaced. Regular rate & rhythm. No rubs, gallops or murmurs.  Lungs: clear  Abdomen: soft, nontender, nondistended. No hepatosplenomegaly. No bruits or masses. Good bowel sounds.  Extremities: no cyanosis, clubbing, rash, no lower extremity edema.  Neuro: alert & orientedx3, cranial nerves grossly intact. moves all 4 extremities w/o difficulty. Affect pleasant   Telemetry: A-V sequential pacing  Labs: Basic Metabolic Panel:  Recent Labs Lab 01/23/13 0600 01/24/13 0618 01/25/13 0540 01/26/13 1032 01/27/13 0500  NA 137 139 135 136 139  K 3.7 3.0* 2.9* 3.3* 3.5  CL 102 97 94* 94* 96  CO2 27 29 30  33* 32  GLUCOSE 112* 112* 109* 169* 124*  BUN 18 19 19 20 22   CREATININE 1.32 1.25 1.28 1.32 1.34  CALCIUM 8.5 8.4 8.2* 8.7 8.9  MG 2.1  --   --   --   --      Liver Function Tests:  Recent Labs Lab 01/22/13 1916  AST 25  ALT 14  ALKPHOS 98  BILITOT 1.0  PROT 7.4  ALBUMIN 3.7   No results found for this basename: LIPASE, AMYLASE,  in the last 168 hours No results found for this basename: AMMONIA,  in the last 168 hours  CBC:  Recent Labs Lab 01/22/13 1916 01/25/13 0540  WBC 8.8 7.8  NEUTROABS 7.5  --   HGB 12.2* 11.2*  HCT 37.2* 34.2*  MCV 91.9 91.0  PLT 220 200    Cardiac Enzymes: No results found for this basename: CKTOTAL, CKMB, CKMBINDEX, TROPONINI,  in the last 168 hours  BNP: BNP (last 3 results)  Recent Labs  12/08/12 1634 01/22/13 1916  PROBNP 3648.0* 6466.0*     Other results: Imaging: No results found.   Medications:     Scheduled Medications: . amiodarone  300 mg Oral Daily  . carvedilol  3.125 mg Oral BID WC  . digoxin  0.125 mg Oral Daily  . levothyroxine  125 mcg Oral QAC breakfast  . pantoprazole  40 mg Oral Daily  . ramipril  2.5 mg Oral QPM  . simvastatin  10 mg Oral QPM  . sodium chloride  3 mL Intravenous Q12H  . spironolactone  12.5 mg Oral Daily  .  torsemide  60 mg Oral Daily  . Warfarin - Pharmacist Dosing Inpatient   Does not apply q1800    Infusions:    PRN Medications: sodium chloride, acetaminophen, guaiFENesin-dextromethorphan, ondansetron (ZOFRAN) IV, sodium chloride     Assessment/Pman  1. Acute on chronic systolic CHF: Nonischemic cardiomyopathy. 01/24/13 EF 20%. Has Medtronic CRT device.  Looks euvolemic today, feels better.  - Stable yesterday on torsemide.  - Continue current ramipril, digoxin, and spironolactone.  - Will try to increase Coreg to 6.25 mg bid for discharge.  2. Atrial arrhythmias: Recent DCCV for atrial tachycardia, also has had paroxysmal atrial fibrillation. He is A-V sequentially paced currently. Continue amiodarone. On coumadin, pharmacy to dose. 3. OSA: Continue CPAP 4. Will arrange followup in CHF clinic for next week with BMET and  digoxin level.    Length of Stay: 5   Marca Ancona NP-C 01/27/2013, 7:54 AM

## 2013-01-27 NOTE — Progress Notes (Signed)
ANTICOAGULATION CONSULT NOTE - Follow up Consult  Pharmacy Consult for warfarin Indication: atrial fibrillation  No Known Allergies  Patient Measurements: Height: 6\' 2"  (188 cm) Weight: 214 lb (97.07 kg) (b scale) IBW/kg (Calculated) : 82.2  Vital Signs: Temp: 98.1 F (36.7 C) (08/05 0600) Temp src: Oral (08/05 0600) BP: 95/59 mmHg (08/05 0937) Pulse Rate: 71 (08/05 0937)  Labs:  Recent Labs  01/25/13 0540 01/26/13 0505 01/26/13 1032 01/27/13 0500  HGB 11.2*  --   --   --   HCT 34.2*  --   --   --   PLT 200  --   --   --   LABPROT 28.4* 20.5*  --  18.3*  INR 2.78* 1.82*  --  1.57*  CREATININE 1.28  --  1.32 1.34    Estimated Creatinine Clearance: 52.8 ml/min (by C-G formula based on Cr of 1.34).   Medications:  Scheduled:  . amiodarone  300 mg Oral Daily  . carvedilol  6.25 mg Oral BID WC  . digoxin  0.125 mg Oral Daily  . levothyroxine  125 mcg Oral QAC breakfast  . pantoprazole  40 mg Oral Daily  . ramipril  2.5 mg Oral QPM  . simvastatin  10 mg Oral QPM  . sodium chloride  3 mL Intravenous Q12H  . spironolactone  12.5 mg Oral Daily  . torsemide  60 mg Oral Daily  . warfarin  5 mg Oral ONCE-1800  . Warfarin - Pharmacist Dosing Inpatient   Does not apply q1800    Assessment: 18 YOM with hx of CHF brought in with increased SOB and LE edema. Pharmacy asked to take over warfarin dosing.   Home dose of warfarin is 2.5mg  daily except 5mg  on Tuesdays and Thursdays.   INR continues to drop today to 1.5. No clear reason for decrease, no new meds. INR could have been high initially due to heart failure/congestion and with that resolving INR is now low. Will give extra tonight with hopes to trend INR back up.  Goal of Therapy:  INR 2-3 Monitor platelets by anticoagulation protocol: Yes   Plan:  1. Warfarin 7.5 mg po x 1 2. Daily PT/INR 3. Follow for any other reports of bruising or bleeding  Sheppard Coil PharmD., BCPS Clinical Pharmacist Pager  [phone] 01/27/2013 1:06 PM

## 2013-01-27 NOTE — Progress Notes (Signed)
Patient is discharged from room 4E22 at this time. Instructions read to patient and family and verbalized understanding. IV site d/c'd and also tele. Family at his side and he is in stable condition.

## 2013-01-27 NOTE — Discharge Summary (Signed)
Patient ID: [name] [name] MRN: [id number] DOB/AGE: [birth year] 79 y.o.  Admit date: 01/22/2013 Discharge date: 01/27/2013  Primary Discharge Diagnosis Acute on chronic systolic/diastolic CHF Secondary Discharge Diagnosis  Paroxysmal atrial fibrillation on systemic anticoagulation  LBBB  Nonischemic DCM  Thrombocytopenia  Biventricular AICD  Ventricular tachycardia  Hypothyroidism  Depression  Dyslipidemia  Obstructive sleep apnea on CPAP  Significant Diagnostic Studies: cardiac graphics: Echocardiogram: Study Conclusions  - Left ventricle: The cavity size was moderately dilated. Systolic function was severely reduced. The estimated ejection fraction was in the range of 25% to 30%. Diffuse hypokinesis. There is severe hypokinesis of the entireinferior myocardium. - Ventricular septum: Septal motion showed paradox. - Aortic valve: Mild regurgitation. - Mitral valve: Severe regurgitation. - Left atrium: The atrium was moderately to severely dilated. - Right ventricle: The cavity size was moderately dilated. Systolic function was moderately reduced. - Right atrium: The atrium was mildly dilated. - Pulmonary arteries: PA peak pressure: 45mm Hg (S).    Consults: cardiology - Advanced Heart Failure Service  Hospital Course: [name] is a 79 y.o. male who presents for evaluation of shortness of breath. He has a known history of CAD, PAF, OSA, and DCM with BIV-ICD with EF 15%. He recently underwent DCCV for atrial tachycardia 12/24/12. He felt better for about a week post procedure. He states however for past two weeks at least, he has noticed progressive worsening of dyspnea on exertion. On day of admission the family noticed significant distress with ambulation to bathroom at home. This has been associated with increased lower extremity swelling, and abdominal distention. He denies PND or orthopnea, but wife states that he did have trouble if not wearing CPAP this week. He  otherwise denies any chest pain, palpitations, presyncope, or syncope. His ICD has not fired. He has been compliant with medications. He was admitted and found to be in acute on chronic systolic CHF.  He was started on IV lasix drip and diuresed over 13 pounds.  He symptomatically improved throughout his hospital stay.  Advanced Heart Failure Team consult was obtained and he was changed from lasix to PO Torsemide.  He was started on digoxin and Aldactone as well.  He did have a fall in the hospital and bruised with left elbow.  Xrays did not show any fracture.  On the day of discharge he was ambulating without SOB.  He was discharged to home in stable condition.  He was counseled to weight himself daily at the same time of day and to call if he had more than a 3 pound weight gain in 24 hours.       Discharge Exam: Blood pressure 95/59, pulse 71, temperature 98.1 F (36.7 C), temperature source Oral, resp. rate 17, height 6\' 2"  (1.88 m), weight 97.07 kg (214 lb), SpO2 92.00%.   General appearance: alert Resp: clear to auscultation bilaterally Cardio: regular rate and rhythm, S1, S2 normal, no murmur, click, rub or gallop GI: soft, non-tender; bowel sounds normal; no masses,  no organomegaly Extremities: extremities normal, atraumatic, no cyanosis or edema Labs:   Lab Results  Component Value Date   WBC 7.8 01/25/2013   HGB 11.2* 01/25/2013   HCT 34.2* 01/25/2013   MCV 91.0 01/25/2013   PLT 200 01/25/2013    Recent Labs Lab 01/22/13 1916  01/27/13 0500  NA 134*  < > 139  K 4.0  < > 3.5  CL 98  < > 96  CO2 25  < > 32  BUN  18  < > 22  CREATININE 1.40*  < > 1.34  CALCIUM 8.9  < > 8.9  PROT 7.4  --   --   BILITOT 1.0  --   --   ALKPHOS 98  --   --   ALT 14  --   --   AST 25  --   --   GLUCOSE 123*  < > 124*  < > = values in this interval not displayed.     Radiology:*RADIOLOGY REPORT*  Clinical Data: Shortness of breath and cough.  CHEST - 2 VIEW  Comparison: 01/13/2013  Findings:  Diffuse bilateral airspace disease is favored to  represent acute pulmonary edema. The heart remains moderately  enlarged and there is stable appearance of a biventricular pacing /  defibrillator device. No significant pleural fluid is identified.  There is no evidence of pulmonary consolidation. Stable  degenerative changes are present in the spine.  IMPRESSION:  Diffuse pulmonary edema.  Original Report Authenticated By: Irish Lack, M.D.  EKG: NSR with V pacing  FOLLOW UP PLANS AND APPOINTMENTS Discharge Orders   Future Appointments Provider Department Dept Phone   02/02/2013 11:45 AM [facility] [employer] Main [facility] [place]) [phone]   02/03/2013 2:00 PM [facility] [phone]   03/10/2013 9:15 AM [facility] [phone]   03/10/2013 9:45 AM Myrtis Ser, NP [facility] [phone]   Future Orders Complete By Expires     Diet - low sodium heart healthy  As directed     Increase activity slowly  As directed         Medication List    STOP taking these medications       furosemide 20 MG tablet  Commonly known as:  LASIX      TAKE these medications       amiodarone 200 MG tablet  Commonly known as:  PACERONE  Take 300 mg by mouth daily. Take 1 and 1/2 tablets daily     carvedilol 6.25 MG tablet  Commonly known as:  COREG  Take 1 tablet (6.25 mg total) by mouth 2 (two) times daily with a meal.     digoxin 0.125 MG tablet  Commonly known as:  LANOXIN  Take 1 tablet (0.125 mg total) by mouth daily.     esomeprazole 40 MG capsule  Commonly known as:  NEXIUM  Take 40 mg by mouth daily before breakfast.     levothyroxine 125 MCG tablet  Commonly known as:  SYNTHROID, LEVOTHROID  Take 125 mcg by mouth daily before breakfast.     potassium chloride SA 20 MEQ tablet  Commonly known as:  K-DUR,KLOR-CON   Take 20 mEq by mouth every morning.     ramipril 2.5 MG tablet  Commonly known as:  ALTACE  Take 2.5 mg by mouth every evening.     simvastatin 10 MG tablet  Commonly known as:  ZOCOR  Take 10 mg by mouth every evening.     spironolactone 12.5 mg Tabs tablet  Commonly known as:  ALDACTONE  Take 1 tablet by mouth daily     torsemide 20 MG tablet  Commonly known as:  DEMADEX  Take 3 tablets (60 mg total) by mouth daily.     warfarin 5 MG tablet  Commonly known as:  COUMADIN  Take1 and 1/2 tablet tonight then Take one tab (5 mg) on  Tues & Thurs & 1/2 tab (2.5mg ) on all other days           Follow-up Information   Follow up with Arvilla Meres, MD On 02/03/2013. (At 2:00 Garage Code 0002 )    Contact information:   [address] [place] [address] [phone]       Follow up with Quintella Reichert, MD On 02/19/2013. (at 10am)    Contact information:   [address] [place] [address] [phone]       Follow up with Quintella Reichert, MD On 01/29/2013. (at 10am for coumadin clinic)    Contact information:   [address] [place] [address] [phone]       Follow up with Quintella Reichert, MD On 01/29/2013. (got to lab at Dr. Norris Cross office)    Contact information:   [address] [place] [address] [phone]       BRING ALL MEDICATIONS WITH YOU TO FOLLOW UP APPOINTMENTS  Time spent with patient to include physician time:  45 minutes Signed: Quintella Reichert 01/27/2013, 3:12 PM

## 2013-01-27 NOTE — Progress Notes (Signed)
Pt no complaints of pain or SOB. Family at bedside Pt anxious to go home.

## 2013-01-28 ENCOUNTER — Other Ambulatory Visit: Payer: Self-pay

## 2013-01-30 NOTE — ED Provider Notes (Signed)
I have personally seen and examined the patient.  I have discussed the plan of care with the resident.  I have reviewed the documentation on PMH/FH/Soc. History.  I have reviewed the documentation of the resident and agree.  Doug Sou, MD 01/30/13 1454

## 2013-02-02 ENCOUNTER — Ambulatory Visit (INDEPENDENT_AMBULATORY_CARE_PROVIDER_SITE_OTHER): Payer: Medicare Other | Admitting: *Deleted

## 2013-02-02 DIAGNOSIS — Z95810 Presence of automatic (implantable) cardiac defibrillator: Secondary | ICD-10-CM

## 2013-02-02 DIAGNOSIS — I509 Heart failure, unspecified: Secondary | ICD-10-CM

## 2013-02-02 DIAGNOSIS — I428 Other cardiomyopathies: Secondary | ICD-10-CM

## 2013-02-03 ENCOUNTER — Ambulatory Visit (HOSPITAL_COMMUNITY)
Admission: RE | Admit: 2013-02-03 | Discharge: 2013-02-03 | Disposition: A | Discharge status: Home / Self Care | Payer: Medicare Other | Source: Ambulatory Visit | Attending: Internal Medicine | Admitting: Internal Medicine

## 2013-02-03 ENCOUNTER — Encounter (HOSPITAL_COMMUNITY): Payer: Self-pay

## 2013-02-03 VITALS — BP 92/58 | HR 75 | Wt 216.0 lb

## 2013-02-03 DIAGNOSIS — I509 Heart failure, unspecified: Secondary | ICD-10-CM | POA: Insufficient documentation

## 2013-02-03 DIAGNOSIS — G4730 Sleep apnea, unspecified: Secondary | ICD-10-CM | POA: Insufficient documentation

## 2013-02-03 LAB — BASIC METABOLIC PANEL
BUN: 49 mg/dL — ABNORMAL HIGH (ref 6–23)
CO2: 27 mEq/L (ref 19–32)
Chloride: 93 mEq/L — ABNORMAL LOW (ref 96–112)
Creatinine, Ser: 1.81 mg/dL — ABNORMAL HIGH (ref 0.50–1.35)
GFR calc Af Amer: 40 mL/min — ABNORMAL LOW (ref >90)
Potassium: 4.8 mEq/L (ref 3.5–5.1)

## 2013-02-03 MED ORDER — TORSEMIDE 20 MG PO TABS: ORAL_TABLET | ORAL | Status: DC

## 2013-02-03 NOTE — Progress Notes (Addendum)
Optivol Weight Range   Baseline proBNP    HPI: [name] is a 79 year old with a history of A tach S/P successful DC-CV 12/29/12, CAD, PAF on chronic coumadin and amiodarone, OSA- CPAP, NICM cath 2008, chronic systolic heart failure EF 25-30% (01/2013) , S/P CRT-D 2008 And generator changed 2012, , LBBB, and Hypothyroidism.   He is S/P DC-CV 12/29/12 and initially he felt good for about a week. He has had a functional decline and only able to walk a few steps. He presented [facility] ED 01/22/13 with increased dyspnea on exertion and low extremity edema. He was started on a lasix gtt 10 mg hr. Discharge weight: 214 lbs  Follow up: Since discharge his weight has continued to trend down. He is currently on torsemide 60 mg daily. SBP at home 87-116. Reports feeling very fatigued. Denies SOB, orthopnea, or CP. Denies any palpitations.   ROS: All systems negative except as listed in HPI, PMH and Problem List.  Past Medical History  Diagnosis Date  . Nonischemic cardiomyopathy     ef 10 %  . BPH (benign prostatic hypertrophy)   . Hypothyroidism   . Left bundle branch block   . Atrial fibrillation     fibrillation/flutter  . Depression   . Sleep apnea     on CPAP.    Marland Kitchen Hyperlipidemia   . Thrombocytopenia   . Osteoarthritis   . CHF (congestive heart failure)   . Acute on chronic systolic heart failure     Current Outpatient Prescriptions  Medication Sig Dispense Refill  . amiodarone (PACERONE) 200 MG tablet Take 300 mg by mouth daily. Take 1 and 1/2 tablets daily      . carvedilol (COREG) 6.25 MG tablet Take 1 tablet (6.25 mg total) by mouth 2 (two) times daily with a meal.  60 tablet  11  . digoxin (LANOXIN) 0.125 MG tablet Take 1 tablet (0.125 mg total) by mouth daily.  30 tablet  11  . esomeprazole (NEXIUM) 40 MG capsule Take 40 mg by mouth daily before breakfast.      . levothyroxine (SYNTHROID, LEVOTHROID) 125 MCG tablet Take 125 mcg by mouth daily before breakfast.       . potassium chloride  SA (K-DUR,KLOR-CON) 20 MEQ tablet Take 20 mEq by mouth every morning.       . ramipril (ALTACE) 2.5 MG tablet Take 2.5 mg by mouth every evening.       . simvastatin (ZOCOR) 10 MG tablet Take 10 mg by mouth every evening.       Marland Kitchen spironolactone (ALDACTONE) 12.5 mg TABS tablet Take 1 tablet by mouth daily  15 tablet  11  . torsemide (DEMADEX) 20 MG tablet Take 3 tablets (60 mg total) by mouth daily.  30 tablet  11  . warfarin (COUMADIN) 5 MG tablet Take1 and 1/2 tablet tonight then Take one tab (5 mg) on Tues & Thurs & 1/2 tab (2.5mg ) on all other days       No current facility-administered medications for this encounter.   PHYSICAL EXAM: Filed Vitals:   02/03/13 1403  BP: 92/58  Pulse: 75  Weight: 216 lb (97.977 kg)  SpO2: 95%   General: Elderly appearing. No resp difficulty  HEENT: normal  Neck: supple. JVP flat. Carotids 2+ bilat; no bruits. No lymphadenopathy or thryomegaly appreciated.  Cor: PMI nondisplaced. Regular rate & rhythm. No rubs, gallops or murmurs.  Lungs: clear  Abdomen: soft, nontender, nondistended. No hepatosplenomegaly. No bruits or masses.  Good bowel sounds.  Extremities: no cyanosis, clubbing, rash, no lower extremity edema.  Neuro: alert & orientedx3, cranial nerves grossly intact. moves all 4 extremities w/o difficulty. Affect pleasant   ASSESSMENT & PLAN:  1) Chronic systolic HF, NICM 01/2013 EF 20%; Optivol CRT - NYHA II symptoms. Volume status appears dry. Complaining of fatigue and weight is down 4 lbs since discharge. Will have patient hold diuretics tomorrow and then restart 40 mg daily. If weight greater than 216 on home scale take extra 20 mg torsemide. If weight less or equal to 210 lbs hold torsemide - SBP soft today. As above hold diuretics. Will not titrate BB or ACE-I at this time, hopefully can try next time. - F/U 2 weeks Dr. Mayford Knife and then with [place] 1 month  2) OSA -Continue CPAP at night  Ulla Potash B NP-C 2:38 PM

## 2013-02-03 NOTE — Patient Instructions (Addendum)
Hold torsemide tomorrow and then start taking 40 mg daily. If weight is less than 210 lbs hold your torsemide.  If weight is greater than 216 lbs take 60 mg torsemide. Do not take your potassium if you do not take your torsemide.  Doing great.  F/u 1 month  Do the following things EVERYDAY: 1) Weigh yourself in the morning before breakfast. Write it down and keep it in a log. 2) Take your medicines as prescribed 3) Eat low salt foods-Limit salt (sodium) to 2000 mg per day.  4) Stay as active as you can everyday 5) Limit all fluids for the day to less than 2 liters 6)

## 2013-02-10 ENCOUNTER — Telehealth (HOSPITAL_COMMUNITY): Payer: Self-pay | Admitting: *Deleted

## 2013-02-10 DIAGNOSIS — I5022 Chronic systolic (congestive) heart failure: Secondary | ICD-10-CM

## 2013-02-10 LAB — REMOTE ICD DEVICE
AL AMPLITUDE: 0.8 mv
AL THRESHOLD: 0.75 V
BATTERY VOLTAGE: 3.0759 V
FVT: 0
LV LEAD IMPEDENCE ICD: 532 Ohm
LV LEAD THRESHOLD: 0.75 V
RV LEAD IMPEDENCE ICD: 456 Ohm
RV LEAD THRESHOLD: 0.875 V
TOT-0006: 20120724000000
TZAT-0001ATACH: 1
TZAT-0001ATACH: 2
TZAT-0001SLOWVT: 1
TZAT-0002ATACH: NEGATIVE
TZAT-0002ATACH: NEGATIVE
TZAT-0002ATACH: NEGATIVE
TZAT-0002FASTVT: NEGATIVE
TZAT-0002SLOWVT: NEGATIVE
TZAT-0002SLOWVT: NEGATIVE
TZAT-0012ATACH: 150 ms
TZAT-0012SLOWVT: 200 ms
TZAT-0018ATACH: NEGATIVE
TZAT-0018ATACH: NEGATIVE
TZAT-0018ATACH: NEGATIVE
TZAT-0018FASTVT: NEGATIVE
TZAT-0018SLOWVT: NEGATIVE
TZAT-0018SLOWVT: NEGATIVE
TZAT-0019ATACH: 6 V
TZAT-0019ATACH: 6 V
TZAT-0019FASTVT: 8 V
TZAT-0019SLOWVT: 8 V
TZAT-0020ATACH: 1.5 ms
TZAT-0020FASTVT: 1.5 ms
TZAT-0020SLOWVT: 1.5 ms
TZON-0003ATACH: 400 ms
TZON-0004VSLOWVT: 28
TZST-0001ATACH: 4
TZST-0001ATACH: 5
TZST-0001FASTVT: 5
TZST-0001FASTVT: 6
TZST-0001SLOWVT: 3
TZST-0001SLOWVT: 6
TZST-0002ATACH: NEGATIVE
TZST-0002ATACH: NEGATIVE
TZST-0002FASTVT: NEGATIVE
TZST-0002FASTVT: NEGATIVE
TZST-0002FASTVT: NEGATIVE
TZST-0002SLOWVT: NEGATIVE
TZST-0002SLOWVT: NEGATIVE
VENTRICULAR PACING ICD: 99.97 pct
VF: 0

## 2013-02-10 NOTE — Telephone Encounter (Signed)
Message copied by Noralee Space on Tue Feb 10, 2013  3:55 PM ------      Message from: Dolores Patty      Created: Sun Feb 08, 2013  3:50 PM       Renal function slightly worse. Let's recheck later this week. ------

## 2013-02-12 ENCOUNTER — Other Ambulatory Visit (INDEPENDENT_AMBULATORY_CARE_PROVIDER_SITE_OTHER): Payer: Medicare Other

## 2013-02-12 DIAGNOSIS — I5022 Chronic systolic (congestive) heart failure: Secondary | ICD-10-CM

## 2013-02-12 LAB — BASIC METABOLIC PANEL
BUN: 27 mg/dL — ABNORMAL HIGH (ref 6–23)
CO2: 27 mEq/L (ref 19–32)
Calcium: 9.1 mg/dL (ref 8.4–10.5)
Chloride: 98 mEq/L (ref 96–112)
Creatinine, Ser: 1.7 mg/dL — ABNORMAL HIGH (ref 0.4–1.5)
Glucose, Bld: 169 mg/dL — ABNORMAL HIGH (ref 70–99)

## 2013-02-17 ENCOUNTER — Encounter (HOSPITAL_COMMUNITY): Payer: Self-pay

## 2013-02-17 ENCOUNTER — Ambulatory Visit (HOSPITAL_COMMUNITY)
Admission: RE | Admit: 2013-02-17 | Discharge: 2013-02-17 | Disposition: A | Discharge status: Home / Self Care | Payer: Medicare Other | Source: Ambulatory Visit | Attending: Internal Medicine | Admitting: Internal Medicine

## 2013-02-17 ENCOUNTER — Encounter: Payer: Self-pay | Admitting: Internal Medicine

## 2013-02-17 VITALS — BP 94/62 | HR 76 | Wt 219.4 lb

## 2013-02-17 DIAGNOSIS — I5022 Chronic systolic (congestive) heart failure: Secondary | ICD-10-CM | POA: Insufficient documentation

## 2013-02-17 DIAGNOSIS — I4891 Unspecified atrial fibrillation: Secondary | ICD-10-CM | POA: Insufficient documentation

## 2013-02-17 DIAGNOSIS — E1159 Type 2 diabetes mellitus with other circulatory complications: Secondary | ICD-10-CM | POA: Insufficient documentation

## 2013-02-17 MED ORDER — AMIODARONE HCL 200 MG PO TABS: 200.0000 mg | ORAL_TABLET | Freq: Every day | ORAL | Status: DC

## 2013-02-17 NOTE — Patient Instructions (Addendum)
Labs in 2 weeks  Your physician recommends that you schedule a follow-up appointment in: 1 month

## 2013-02-17 NOTE — Progress Notes (Signed)
Patient ID: [name] [name], male   DOB: 1931/10/10, 79 y.o.   MRN: [id number] Optivol  HPI: [name] [name] is a 79 year old with a history of A tach S/P successful DC-CV 12/29/12, CAD, PAF on chronic coumadin and amiodarone, OSA- CPAP, NICM cath 2008, chronic systolic heart failure EF 25-30% (01/2013) , S/P Medtronic CRT-D 2008 and generator changed 2012, LBBB, CRI (baseline 1.7-1.8) and Hypothyroidism.   He is S/P DC-CV 12/29/12 and initially he felt good for about a week. He has had a functional decline and only able to walk a few steps. He presented [facility] ED 01/22/13 with increased dyspnea on exertion and low extremity edema. He was started on a lasix gtt 10 mg hr. Admit weight 233 lbs. Discharge weight: 214 lbs  Follow up: Seen 2 weeks ago and weight was down and felt to be dry. Torsemide cut back from 60 mg daily to 40 mg daily. Weight now up 3 pounds 216->219. Remains on amio 300 mg daily. Weighing himself everyday at home. Weight stable 211-212. Would go to 60 mg daily if weight 216 or greater. Has not had to do that. Energy level improving. Going out and driving the car. But easily fatigued. Now can walk to mailbox and back slowly. HR usually 70-78. Had 2 days when it was running 120s but then popped back. Feels dizzy at time. Feels balance is off. Lives with wife at home who uses a [employer]. 2 daughters are taking care of him and cooking low sodium foods. (One lives in [place] and one in [place] - they travel to town). Remains on coumadin.   Labs 8/21: Na 132 K 4.5 Cr 1.7 (stable)   ROS: All systems negative except as listed in HPI, PMH and Problem List.  Past Medical History  Diagnosis Date  . Nonischemic cardiomyopathy     ef 10 %  . BPH (benign prostatic hypertrophy)   . Hypothyroidism   . Left bundle branch block   . Atrial fibrillation     fibrillation/flutter  . Depression   . Sleep apnea     on CPAP.    Marland Kitchen Hyperlipidemia   . Thrombocytopenia   . Osteoarthritis   . CHF (congestive heart  failure)   . Acute on chronic systolic heart failure     Current Outpatient Prescriptions  Medication Sig Dispense Refill  . amiodarone (PACERONE) 200 MG tablet Take 300 mg by mouth daily.       Marland Kitchen aspirin 81 MG tablet Take 81 mg by mouth daily.      . carvedilol (COREG) 6.25 MG tablet Take 1 tablet (6.25 mg total) by mouth 2 (two) times daily with a meal.  60 tablet  11  . digoxin (LANOXIN) 0.125 MG tablet Take 1 tablet (0.125 mg total) by mouth daily.  30 tablet  11  . levothyroxine (SYNTHROID, LEVOTHROID) 125 MCG tablet Take 125 mcg by mouth daily before breakfast.       . potassium chloride SA (K-DUR,KLOR-CON) 20 MEQ tablet Take 20 mEq by mouth every morning.       . ramipril (ALTACE) 2.5 MG tablet Take 2.5 mg by mouth every evening.       . simvastatin (ZOCOR) 10 MG tablet Take 10 mg by mouth every evening.       Marland Kitchen spironolactone (ALDACTONE) 12.5 mg TABS tablet Take 1 tablet by mouth daily  15 tablet  11  . torsemide (DEMADEX) 20 MG tablet Take 40 mg daily. Take extra 20 mg if  weight greater than 216 lbs.  60 tablet  3  . warfarin (COUMADIN) 5 MG tablet Take1 and 1/2 tablet tonight then Take one tab (5 mg) on Tues & Thurs & 1/2 tab (2.5mg ) on all other days       No current facility-administered medications for this encounter.   PHYSICAL EXAM: Filed Vitals:   02/17/13 0857  BP: 94/62  Pulse: 76  Weight: 219 lb 6.4 oz (99.519 kg)  SpO2: 98%   General: Elderly appearing. No resp difficulty  HEENT: normal  Neck: supple. JVP flat. Carotids 2+ bilat; no bruits. No lymphadenopathy or thryomegaly appreciated.  Cor: PMI nondisplaced. Regular rate & rhythm. No rubs, gallops. Soft 2/6 [name]  Lungs: clear  Abdomen: soft, nontender, nondistended. No hepatosplenomegaly. No bruits or masses. Good bowel sounds.  Extremities: no cyanosis, clubbing, rash, no lower extremity edema.  Neuro: alert & orientedx3, cranial nerves grossly intact. moves all 4 extremities w/o difficulty. Affect pleasant    ASSESSMENT & PLAN:  1) Chronic systolic HF, NICM 01/2013 EF 20%; Optivol CRT - He really is quite optimized from a HF perspective. Volume status looks quite good. That said, remains NYHA III. BP too low to titrate HF medications further at this point. Reinforced need for daily weights and reviewed use of sliding scale diuretics. Will check Optivol today and then monthly. I do not think he is a candidate for VAD therapy at this point but I will have to get to know him better. Will check labs again including CMET, digoxin and TFTs in 2 weeks.   2) OSA -Continue CPAP at night  3) Atrial tach/AF -maintaining SR on amio. Will cut to 200 daily - we discussed switching to NOAC but he says he cannot afford. - check labs in 2 weeks as above  Arvilla Meres MD 9:23 AM   Addendum:  I reviewed Optivol tracings today and volume status appears very low and activity level has also dropped off markedly. I think we need to cut back diuretics. Will alternate demadex 40 daily with 20 daily. May need to cut back even further. Long talk about results of Optivol interrogation. We are enrolling him into remote monitoring program.  Truman Hayward 3:02 PM

## 2013-02-19 ENCOUNTER — Telehealth (HOSPITAL_COMMUNITY): Payer: Self-pay | Admitting: *Deleted

## 2013-02-19 NOTE — Telephone Encounter (Signed)
Dr Mayford Knife called to let Dr Gala Romney know that she saw pt in clinic today, he c/o dizziness, SBP 95 she knows we decreased Lasix on Tue and she is cutting his carvedilol back to 3.125 mg BID, she just wanted Dr Gala Romney to be aware, will send him message

## 2013-02-20 NOTE — Telephone Encounter (Signed)
Left message to call back  

## 2013-02-20 NOTE — Telephone Encounter (Signed)
Pt returned call. Advised to hold lasix x 3 days and call office on Tuesday with update. Pt voiced understanding

## 2013-02-20 NOTE — Telephone Encounter (Signed)
Would hold lasix completely for 3 days.

## 2013-02-24 NOTE — Telephone Encounter (Signed)
Spoke w/pt he states pt is pretty stable, he restarted torsemide today and took 20 mg will resume previous dose of 20 mg alternating with 40 mg next day, he will let us know if wt decreased or increased too much

## 2013-02-26 ENCOUNTER — Telehealth: Payer: Self-pay | Admitting: Internal Medicine

## 2013-02-26 NOTE — Telephone Encounter (Signed)
Spoke w/pt and based on transmission optivol is normal at this time. Pt aware.

## 2013-02-26 NOTE — Telephone Encounter (Signed)
New Prob     Pt has some questions regarding transmitting. Please call.

## 2013-03-02 ENCOUNTER — Other Ambulatory Visit (INDEPENDENT_AMBULATORY_CARE_PROVIDER_SITE_OTHER): Payer: Medicare Other

## 2013-03-02 DIAGNOSIS — I4891 Unspecified atrial fibrillation: Secondary | ICD-10-CM

## 2013-03-02 DIAGNOSIS — I5022 Chronic systolic (congestive) heart failure: Secondary | ICD-10-CM

## 2013-03-02 LAB — HEPATIC FUNCTION PANEL
AST: 28 U/L (ref 0–37)
Albumin: 4 g/dL (ref 3.5–5.2)
Total Bilirubin: 1 mg/dL (ref 0.3–1.2)

## 2013-03-02 LAB — BASIC METABOLIC PANEL
BUN: 28 mg/dL — ABNORMAL HIGH (ref 6–23)
Calcium: 9 mg/dL (ref 8.4–10.5)
Creatinine, Ser: 1.6 mg/dL — ABNORMAL HIGH (ref 0.4–1.5)
GFR: 43.62 mL/min — ABNORMAL LOW (ref >60.00)
Glucose, Bld: 194 mg/dL — ABNORMAL HIGH (ref 70–99)
Potassium: 4.6 mEq/L (ref 3.5–5.1)

## 2013-03-02 LAB — T4, FREE: Free T4: 1.29 ng/dL (ref 0.60–1.60)

## 2013-03-02 LAB — TSH: TSH: 3.95 u[IU]/mL (ref 0.35–5.50)

## 2013-03-03 LAB — DIGOXIN LEVEL: Digoxin Level: 1.6 ng/mL (ref 0.8–2.0)

## 2013-03-05 ENCOUNTER — Telehealth (HOSPITAL_COMMUNITY): Payer: Self-pay | Admitting: *Deleted

## 2013-03-05 MED ORDER — DIGOXIN 125 MCG PO TABS: 0.1250 mg | ORAL_TABLET | ORAL | Status: DC

## 2013-03-05 NOTE — Telephone Encounter (Signed)
Message copied by Noralee Space on Thu Mar 05, 2013  2:09 PM ------      Message from: Arvilla Meres R      Created: Wed Mar 04, 2013  6:32 PM       Dig level too high. Decrease to every other day. Repeat in 2 weeks ------

## 2013-03-05 NOTE — Telephone Encounter (Signed)
Pt aware to decrease dig to QOD and agreeable, he also states that he is feeling lightheaded when he stands and his wt is down to 211 today, he states his torsemide has been cut back to 20 mg daily but his wt still continues to decrease, discussed w/Ali Elsie Ra, NP will have pt hold Torsemide for now and restart when wt is at 215 lb pt aware and agreeable if feels worse will call back, he is sch to see [place] Clovis Cao 9/18 and will keep that appt

## 2013-03-09 ENCOUNTER — Telehealth: Payer: Self-pay | Admitting: Hematology and Oncology

## 2013-03-09 NOTE — Telephone Encounter (Signed)
Moved 9/16 appt to 10/14 w/NG. S/w pt he is aware.

## 2013-03-10 ENCOUNTER — Ambulatory Visit: Payer: Medicare Other | Admitting: Oncology

## 2013-03-10 ENCOUNTER — Other Ambulatory Visit: Payer: Medicare Other | Admitting: Lab

## 2013-03-12 ENCOUNTER — Encounter (HOSPITAL_COMMUNITY): Payer: Medicare Other

## 2013-03-12 ENCOUNTER — Ambulatory Visit (HOSPITAL_COMMUNITY)
Admission: RE | Admit: 2013-03-12 | Discharge: 2013-03-12 | Disposition: A | Discharge status: Home / Self Care | Payer: Medicare Other | Source: Ambulatory Visit | Attending: Cardiology | Admitting: Cardiology

## 2013-03-12 VITALS — BP 106/62 | HR 74 | Wt 220.0 lb

## 2013-03-12 DIAGNOSIS — I5022 Chronic systolic (congestive) heart failure: Secondary | ICD-10-CM | POA: Insufficient documentation

## 2013-03-12 NOTE — Progress Notes (Signed)
Patient ID: [name] [name], male   DOB: [birth year], 79 y.o.   MRN: [id number] Optivol  HPI: [name] [name] is a 79 year old with a history of A tach S/P successful DC-CV 12/29/12, CAD, PAF on chronic coumadin and amiodarone, OSA- CPAP, NICM cath 2008, chronic systolic heart failure EF 25-30% (01/2013) , S/P Medtronic CRT-D 2008 and generator changed 2012, LBBB, CRI (baseline 1.7-1.8) and Hypothyroidism.   He is S/P DC-CV 12/29/12 and initially he felt good for about a week. He has had a functional decline and only able to walk a few steps. He presented [facility] ED 01/22/13 with increased dyspnea on exertion and low extremity edema. He was started on a lasix gtt 10 mg hr. Admit weight 233 lbs. Discharge weight: 214 lbs  He return for follow up.  Since the last visit, torsemide was switched to prn for weight of 215 pounds on greater and at that time he woul take 20 mg torsemide. Weight at home 211-212 pounds.  Denies SOB/PND/Orthopnea. Sleeps on one pillow. Compliant with medications. Limited activity.   Labs 8/21: Na 132 K 4.5 Cr 1.7 (stable)  Labs 03/02/13 NA 135 Potassium 4.6 Creatinine 1.6 Dig 1.6 TSH 3.95  ROS: All systems negative except as listed in HPI, PMH and Problem List.  Past Medical History  Diagnosis Date  . Nonischemic cardiomyopathy     ef 10 %  . BPH (benign prostatic hypertrophy)   . Hypothyroidism   . Left bundle branch block   . Atrial fibrillation     fibrillation/flutter  . Depression   . Sleep apnea     on CPAP.    Marland Kitchen Hyperlipidemia   . Thrombocytopenia   . Osteoarthritis   . CHF (congestive heart failure)   . Acute on chronic systolic heart failure     Current Outpatient Prescriptions  Medication Sig Dispense Refill  . amiodarone (PACERONE) 200 MG tablet Take 1 tablet (200 mg total) by mouth daily.  30 tablet  3  . aspirin 81 MG tablet Take 81 mg by mouth daily.      . carvedilol (COREG) 6.25 MG tablet Take 1 tablet (6.25 mg total) by mouth 2 (two) times daily with a meal.  60  tablet  11  . digoxin (LANOXIN) 0.125 MG tablet Take 1 tablet (0.125 mg total) by mouth every other day.  30 tablet  11  . levothyroxine (SYNTHROID, LEVOTHROID) 125 MCG tablet Take 125 mcg by mouth daily before breakfast.       . ramipril (ALTACE) 2.5 MG tablet Take 2.5 mg by mouth every evening.       . simvastatin (ZOCOR) 10 MG tablet Take 10 mg by mouth every evening.       Marland Kitchen spironolactone (ALDACTONE) 12.5 mg TABS tablet Take 1 tablet by mouth daily  15 tablet  11  . torsemide (DEMADEX) 20 MG tablet Take 20 mg by mouth as needed. As needed for weight 215 or greater. Take 20 mg      . warfarin (COUMADIN) 5 MG tablet Take1 and 1/2 tablet tonight then Take one tab (5 mg) on Tues & Thurs & 1/2 tab (2.5mg ) on all other days      . potassium chloride SA (K-DUR,KLOR-CON) 20 MEQ tablet Take 20 mEq by mouth every morning.        No current facility-administered medications for this encounter.   PHYSICAL EXAM: Filed Vitals:   03/12/13 1426  BP: 106/62  Pulse: 74  Weight: 220 lb (99.791 kg)  SpO2: 96%   General: Elderly appearing. No resp difficulty  HEENT: normal  Neck: supple. JVP flat. Carotids 2+ bilat; no bruits. No lymphadenopathy or thryomegaly appreciated.  Cor: PMI nondisplaced. Regular rate & rhythm. No rubs, gallops. Soft 2/6 [name]  Lungs: clear  Abdomen: soft, nontender, nondistended. No hepatosplenomegaly. No bruits or masses. Good bowel sounds.  Extremities: no cyanosis, clubbing, rash, R and LLE 1-2 no lower extremity edema.  Neuro: alert & orientedx3, cranial nerves grossly intact. moves all 4 extremities w/o difficulty. Affect pleasant   ASSESSMENT & PLAN:  1) Chronic systolic HF, NICM 01/2013 EF 20%; Optivol CRT- - NYHA III. Volume status stable. Continue spironolactone 12.5 mg daily. Continue torsemide 20 mg as needed for weight of 215 pounds or greater.  - Continue carvedilol 6.25 mg twice a day Continue digoxin 0.125 mg every other day (03/03/13 dig level 1.6) -Continue  ramipril 2.5 mg daily Optivol- Fluid status flat Activity < 1 hour per day Reinforced daily weights, low salt food choices, and limiting fluid intake to less than 2 liters per day   2) OSA -Continue CPAP at night  3) Atrial tach/AF -maintaining SR on amio. Continue amiodarone 200 mg daily - we discussed switching to NOAC but he says he cannot afford. INR per Dr Mayford Knife  Follow up in 2-3 weeks.   CLEGG,AMY NP-C 2:32 PM  Patient seen and examined with Tonye Becket, NP. Optivol tracings reviewed with him personally. We discussed all aspects of the encounter. I agree with the assessment and plan as stated above.   He is doing quite well. Volume status stable on exam and by Optivol off torsemide. Reinforced need for daily weights and reviewed use of sliding scale diuretics. He is to take torsemide 20 daily for weight 215 or greater. Also reviewed activity tracings and encouraged him to be more active with 5-10 minutes of walking every hour while awake. Will need to recheck dig level next week with INR check.   Tniya Bowditch,MD 3:27 PM

## 2013-03-12 NOTE — Patient Instructions (Addendum)
Follow up in 3-4 weeks  Do the following things EVERYDAY: 1) Weigh yourself in the morning before breakfast. Write it down and keep it in a log. 2) Take your medicines as prescribed 3) Eat low salt foods-Limit salt (sodium) to 2000 mg per day.  4) Stay as active as you can everyday 5) Limit all fluids for the day to less than 2 liters 

## 2013-03-12 NOTE — Addendum Note (Signed)
Encounter addended by: Dolores Patty, MD on: 03/12/2013  3:33 PM<BR>     Documentation filed: Charges VN

## 2013-03-13 ENCOUNTER — Encounter: Payer: Self-pay | Admitting: *Deleted

## 2013-03-14 ENCOUNTER — Other Ambulatory Visit: Payer: Self-pay | Admitting: Cardiology

## 2013-03-14 DIAGNOSIS — I2510 Atherosclerotic heart disease of native coronary artery without angina pectoris: Secondary | ICD-10-CM

## 2013-03-14 DIAGNOSIS — Z79899 Other long term (current) drug therapy: Secondary | ICD-10-CM

## 2013-03-23 ENCOUNTER — Encounter (HOSPITAL_COMMUNITY): Payer: Self-pay

## 2013-03-24 ENCOUNTER — Encounter: Payer: Self-pay | Admitting: Internal Medicine

## 2013-04-02 ENCOUNTER — Ambulatory Visit (HOSPITAL_COMMUNITY)
Admission: RE | Admit: 2013-04-02 | Discharge: 2013-04-02 | Disposition: A | Discharge status: Home / Self Care | Payer: Medicare Other | Source: Ambulatory Visit | Attending: Cardiology | Admitting: Cardiology

## 2013-04-02 ENCOUNTER — Encounter (HOSPITAL_COMMUNITY): Payer: Self-pay

## 2013-04-02 VITALS — BP 120/72 | HR 72 | Wt 220.8 lb

## 2013-04-02 DIAGNOSIS — I509 Heart failure, unspecified: Secondary | ICD-10-CM | POA: Insufficient documentation

## 2013-04-02 DIAGNOSIS — I428 Other cardiomyopathies: Secondary | ICD-10-CM | POA: Insufficient documentation

## 2013-04-02 DIAGNOSIS — E785 Hyperlipidemia, unspecified: Secondary | ICD-10-CM | POA: Insufficient documentation

## 2013-04-02 DIAGNOSIS — Z7901 Long term (current) use of anticoagulants: Secondary | ICD-10-CM | POA: Insufficient documentation

## 2013-04-02 DIAGNOSIS — E039 Hypothyroidism, unspecified: Secondary | ICD-10-CM | POA: Insufficient documentation

## 2013-04-02 DIAGNOSIS — G4730 Sleep apnea, unspecified: Secondary | ICD-10-CM

## 2013-04-02 DIAGNOSIS — I498 Other specified cardiac arrhythmias: Secondary | ICD-10-CM | POA: Insufficient documentation

## 2013-04-02 DIAGNOSIS — G4733 Obstructive sleep apnea (adult) (pediatric): Secondary | ICD-10-CM | POA: Insufficient documentation

## 2013-04-02 DIAGNOSIS — I5022 Chronic systolic (congestive) heart failure: Secondary | ICD-10-CM | POA: Insufficient documentation

## 2013-04-02 DIAGNOSIS — I4891 Unspecified atrial fibrillation: Secondary | ICD-10-CM

## 2013-04-02 DIAGNOSIS — Z79899 Other long term (current) drug therapy: Secondary | ICD-10-CM | POA: Insufficient documentation

## 2013-04-02 LAB — DIGOXIN LEVEL: Digoxin Level: 0.8 ng/mL (ref 0.8–2.0)

## 2013-04-02 LAB — BASIC METABOLIC PANEL
BUN: 23 mg/dL (ref 6–23)
Calcium: 8.9 mg/dL (ref 8.4–10.5)
Chloride: 99 mEq/L (ref 96–112)
Creatinine, Ser: 1.48 mg/dL — ABNORMAL HIGH (ref 0.50–1.35)
GFR calc Af Amer: 50 mL/min — ABNORMAL LOW (ref >90)

## 2013-04-02 NOTE — Patient Instructions (Signed)
Doing Great, continue current medications  We will contact you in 2 months to schedule your next appointment.  Do the following things EVERYDAY: 1) Weigh yourself in the morning before breakfast. Write it down and keep it in a log. 2) Take your medicines as prescribed 3) Eat low salt foods-Limit salt (sodium) to 2000 mg per day.  4) Stay as active as you can everyday 5) Limit all fluids for the day to less than 2 liters

## 2013-04-02 NOTE — Progress Notes (Signed)
Patient ID: [name] [name], male   DOB: [birth year], 79 y.o.   MRN: [id number]  Optivol  HPI: [name] [name] is a 79 year old with a history of A tach S/P successful DC-CV 12/29/12, CAD, PAF on chronic coumadin and amiodarone, OSA- CPAP, NICM cath 2008, chronic systolic heart failure EF 25-30% (01/2013) , S/P Medtronic CRT-D 2008 and generator changed 2012, LBBB, CRI (baseline 1.7-1.8) and Hypothyroidism.   He is S/P DC-CV 12/29/12 and initially he felt good for about a week. He has had a functional decline and only able to walk a few steps. He presented [facility] ED 01/22/13 with increased dyspnea on exertion and low extremity edema. He was started on a lasix gtt 10 mg hr. Admit weight 233 lbs. Discharge weight: 214 lbs  Follow up: Last visit cut digoxin back to every other day. Weights at home 211-214 lbs. Has not taken any torsemide for over a month. SBP 90-115/60-70s. Denies SOB, PND, Orthopnea or CP. Can walk about 500 ft without stopping. More active. Denies dizziness. Daughter in law died last week.   Optivol: Interrogated, no crossings over threshold. Activity remains very minimal, no afib  Labs 8/21: Na 132 K 4.5 Cr 1.7 (stable)  Labs 03/02/13 NA 135 Potassium 4.6 Creatinine 1.6 Dig 1.6 TSH 3.95  ROS: All systems negative except as listed in HPI, PMH and Problem List.  Past Medical History  Diagnosis Date  . Nonischemic cardiomyopathy     ef 10 %  . BPH (benign prostatic hypertrophy)   . Hypothyroidism   . Left bundle branch block   . Atrial fibrillation     fibrillation/flutter  . Depression   . Sleep apnea     on CPAP.    Marland Kitchen Hyperlipidemia   . Thrombocytopenia   . Osteoarthritis   . CHF (congestive heart failure)   . Acute on chronic systolic heart failure     Current Outpatient Prescriptions  Medication Sig Dispense Refill  . amiodarone (PACERONE) 200 MG tablet Take 1 tablet (200 mg total) by mouth daily.  30 tablet  3  . aspirin 81 MG tablet Take 81 mg by mouth daily.      . carvedilol  (COREG) 6.25 MG tablet Take 1 tablet (6.25 mg total) by mouth 2 (two) times daily with a meal.  60 tablet  11  . digoxin (LANOXIN) 0.125 MG tablet Take 1 tablet (0.125 mg total) by mouth every other day.  30 tablet  11  . levothyroxine (SYNTHROID, LEVOTHROID) 125 MCG tablet Take 125 mcg by mouth daily before breakfast.       . potassium chloride SA (K-DUR,KLOR-CON) 20 MEQ tablet Take 20 mEq by mouth every morning.       . ramipril (ALTACE) 2.5 MG tablet Take 2.5 mg by mouth every evening.       . simvastatin (ZOCOR) 10 MG tablet Take 10 mg by mouth every evening.       Marland Kitchen spironolactone (ALDACTONE) 12.5 mg TABS tablet Take 1 tablet by mouth daily  15 tablet  11  . torsemide (DEMADEX) 20 MG tablet Take 20 mg by mouth as needed. As needed for weight 215 or greater. Take 20 mg      . warfarin (COUMADIN) 5 MG tablet Take1 and 1/2 tablet tonight then Take one tab (5 mg) on Tues & Thurs & 1/2 tab (2.5mg ) on all other days       No current facility-administered medications for this encounter.    Filed Vitals:  04/02/13 0925  BP: 120/72  Pulse: 72  Weight: 220 lb 12.8 oz (100.154 kg)  SpO2: 98%    PHYSICAL EXAM: General: Elderly appearing. No resp difficulty  HEENT: normal  Neck: supple. JVP flat. Carotids 2+ bilat; no bruits. No lymphadenopathy or thryomegaly appreciated.  Cor: PMI nondisplaced. Regular rate & rhythm. No rubs, gallops. Soft 2/6 [name]  Lungs: clear  Abdomen: soft, nontender, nondistended. No hepatosplenomegaly. No bruits or masses. Good bowel sounds.  Extremities: no cyanosis, clubbing, rash, R and LLE 1-2 no lower extremity edema.  Neuro: alert & orientedx3, cranial nerves grossly intact. moves all 4 extremities w/o difficulty. Affect pleasant   ASSESSMENT & PLAN:  1) Chronic systolic HF, NICM 01/2013 EF 20%; Optivol CRT- - NYHA II symptoms. Volume status stable. He is starting to be more active and now can walk about 500 ft with no SOB.  - SBP 90-115 at home. Will not  titrate any medications at this time. Continue spironolactone 12.5 mg daily carvedilol 6.25 mg twice a day,  ramipril 2.5 mg daily and digoxin 0.125 mg every other day. Will check digoxin level today. - Optivol- No crossings over threshold, no afib. Activity remains very minimal. - Reinforced the need and importance of daily weights, a low sodium diet, and fluid restriction (less than 2 L a day). Instructed to call the HF clinic if weight increases more than 3 lbs overnight or 5 lbs in a week.    2) OSA -Continue CPAP at night  3) Atrial tach/AF - maintaining SR on amio. Continue amiodarone 200 mg daily - TSH and LFTs checked 02/2013, nl - recommended go for a yearly eye exam. - we discussed switching to NOAC but he says he cannot afford.  Follow 2 months  [name] [name] B NP-C 9:34 AM

## 2013-04-06 ENCOUNTER — Other Ambulatory Visit: Payer: Self-pay | Admitting: Hematology and Oncology

## 2013-04-06 DIAGNOSIS — D696 Thrombocytopenia, unspecified: Secondary | ICD-10-CM

## 2013-04-07 ENCOUNTER — Ambulatory Visit (HOSPITAL_BASED_OUTPATIENT_CLINIC_OR_DEPARTMENT_OTHER): Payer: Medicare Other | Admitting: Hematology and Oncology

## 2013-04-07 ENCOUNTER — Other Ambulatory Visit (HOSPITAL_BASED_OUTPATIENT_CLINIC_OR_DEPARTMENT_OTHER): Payer: Medicare Other | Admitting: Lab

## 2013-04-07 ENCOUNTER — Encounter: Payer: Self-pay | Admitting: Hematology and Oncology

## 2013-04-07 VITALS — BP 105/60 | HR 75 | Temp 98.6°F | Resp 20 | Ht 74.0 in | Wt 221.8 lb

## 2013-04-07 DIAGNOSIS — D696 Thrombocytopenia, unspecified: Secondary | ICD-10-CM

## 2013-04-07 DIAGNOSIS — D649 Anemia, unspecified: Secondary | ICD-10-CM

## 2013-04-07 HISTORY — DX: Anemia, unspecified: D64.9

## 2013-04-07 LAB — MORPHOLOGY: PLT EST: ADEQUATE

## 2013-04-07 LAB — CBC WITH DIFFERENTIAL/PLATELET
Basophils Absolute: 0 10*3/uL (ref 0.0–0.1)
HCT: 35.9 % — ABNORMAL LOW (ref 38.4–49.9)
HGB: 12.1 g/dL — ABNORMAL LOW (ref 13.0–17.1)
LYMPH%: 11.7 % — ABNORMAL LOW (ref 14.0–49.0)
MCH: 31 pg (ref 27.2–33.4)
MONO#: 0.5 10*3/uL (ref 0.1–0.9)
NEUT%: 76.8 % — ABNORMAL HIGH (ref 39.0–75.0)
Platelets: 167 10*3/uL (ref 140–400)
WBC: 5.7 10*3/uL (ref 4.0–10.3)
lymph#: 0.7 10*3/uL — ABNORMAL LOW (ref 0.9–3.3)

## 2013-04-07 NOTE — Progress Notes (Signed)
[facility] OFFICE PROGRESS NOTE  [name], MD DIAGNOSIS:  Thrombocytopenia, mild anemia, for further management  SUMMARY OF HEMATOLOGIC HISTORY: This patient was seen over a year ago for mild thrombocytopenia. He saw you the hematologist to order some additional workup and felt that it was benign in nature. The patient was being observed. INTERVAL HISTORY: [name] [name] 79 y.o. male returns for history of thrombocytopenia above. The patient had problem with congestive heart failure and fluid retention recently. A lot of his medications were changed. The patient is on chronic anticoagulation therapy. He denies any evidence of bleeding such as epistaxis, hematuria, or hematochezia. Denies any excessive bruising.He denies any recent fever, chills, night sweats or abnormal weight loss  I have reviewed the past medical history, past surgical history, social history and family history with the patient and they are unchanged from previous note.  ALLERGIES:  has No Known Allergies.  MEDICATIONS:  Current Outpatient Prescriptions  Medication Sig Dispense Refill  . amiodarone (PACERONE) 200 MG tablet Take 1 tablet (200 mg total) by mouth daily.  30 tablet  3  . aspirin 81 MG tablet Take 81 mg by mouth daily.      . carvedilol (COREG) 6.25 MG tablet Take 1 tablet (6.25 mg total) by mouth 2 (two) times daily with a meal.  60 tablet  11  . digoxin (LANOXIN) 0.125 MG tablet Take 1 tablet (0.125 mg total) by mouth every other day.  30 tablet  11  . levothyroxine (SYNTHROID, LEVOTHROID) 125 MCG tablet Take 125 mcg by mouth daily before breakfast.       . potassium chloride SA (K-DUR,KLOR-CON) 20 MEQ tablet Take 20 mEq by mouth every morning.       . ramipril (ALTACE) 2.5 MG tablet Take 2.5 mg by mouth every evening.       . simvastatin (ZOCOR) 10 MG tablet Take 10 mg by mouth every evening.       Marland Kitchen spironolactone (ALDACTONE) 12.5 mg TABS tablet Take 1 tablet by mouth  daily  15 tablet  11  . torsemide (DEMADEX) 20 MG tablet Take 20 mg by mouth as needed. As needed for weight 215 or greater. Take 20 mg      . warfarin (COUMADIN) 5 MG tablet Take1 and 1/2 tablet tonight then Take one tab (5 mg) on Tues & Thurs & 1/2 tab (2.5mg ) on all other days       No current facility-administered medications for this visit.     REVIEW OF SYSTEMS:   Constitutional: Denies fevers, chills or night sweats Neurological:Denies numbness, tingling or new weaknesses Behavioral/Psych: Mood is stable, no new changes  All other systems were reviewed with the patient and are negative.  PHYSICAL EXAMINATION: ECOG PERFORMANCE STATUS: 1 - Symptomatic but completely ambulatory  Filed Vitals:   04/07/13 1515  BP: 105/60  Pulse: 75  Temp: 98.6 F (37 C)  Resp: 20   Filed Weights   04/07/13 1515  Weight: 221 lb 12.8 oz (100.608 kg)    GENERAL:alert, no distress and comfortable SKIN: skin color, texture, turgor are normal, no rashes or significant lesions mild bruising is noted NEURO: alert & oriented x 3 with fluent speech, no focal motor/sensory deficits  LABORATORY DATA:  I have reviewed the data as listed Results for orders placed in visit on 04/07/13 (from the past 48 hour(s))  MORPHOLOGY     Status: None   Collection Time    04/07/13  2:11 PM  Result Value Range   Ovalocytes Few  Negative   White Cell Comments C/W auto diff     PLT EST Adequate  Adequate  CBC WITH DIFFERENTIAL     Status: Abnormal   Collection Time    04/07/13  2:11 PM      Result Value Range   WBC 5.7  4.0 - 10.3 10e3/uL   NEUT# 4.4  1.5 - 6.5 10e3/uL   HGB 12.1 (*) 13.0 - 17.1 g/dL   HCT 19.1 (*) 47.8 - 29.5 %   Platelets 167  140 - 400 10e3/uL   MCV 92.1  79.3 - 98.0 fL   MCH 31.0  27.2 - 33.4 pg   MCHC 33.7  32.0 - 36.0 g/dL   RBC 6.21 (*) 3.08 - 6.57 10e6/uL   RDW 17.7 (*) 11.0 - 14.6 %   lymph# 0.7 (*) 0.9 - 3.3 10e3/uL   MONO# 0.5  0.1 - 0.9 10e3/uL   Eosinophils Absolute  0.1  0.0 - 0.5 10e3/uL   Basophils Absolute 0.0  0.0 - 0.1 10e3/uL   NEUT% 76.8 (*) 39.0 - 75.0 %   LYMPH% 11.7 (*) 14.0 - 49.0 %   MONO% 9.7  0.0 - 14.0 %   EOS% 1.2  0.0 - 7.0 %   BASO% 0.6  0.0 - 2.0 %     ASSESSMENT:  #1 thrombocytopenia, resolved #2 anemia, likely anemia of chronic disease  PLAN:  #1 thrombocytopenia, resolved I agree with prior hematologist assessment. The thrombocytopenia likely was medication induced. He has resolved. No further workup is needed. #2 anemia This is likely anemia of chronic disease. The patient denies recent history of bleeding such as epistaxis, hematuria or hematochezia. He is asymptomatic from the anemia. We will observe for now.  he does not require transfusion now.  I reassured the patient that the anemia is benign in nature. I do not think he needs further followup evaluation for this. You can just followup with his primary care provider for this. All questions were answered. The patient knows to call the clinic with any problems, questions or concerns. No barriers to learning was detected.  I spent 15 minutes counseling the patient face to face. The total time spent in the appointment was 20 minutes and more than 50% was on counseling.     [facility], Kallin Henk, MD 04/07/2013 3:24 PM

## 2013-04-09 ENCOUNTER — Ambulatory Visit (INDEPENDENT_AMBULATORY_CARE_PROVIDER_SITE_OTHER): Payer: Medicare Other | Admitting: Cardiology

## 2013-04-09 ENCOUNTER — Encounter: Payer: Self-pay | Admitting: Cardiology

## 2013-04-09 VITALS — BP 122/74 | HR 71 | Ht 74.0 in | Wt 220.0 lb

## 2013-04-09 DIAGNOSIS — I5022 Chronic systolic (congestive) heart failure: Secondary | ICD-10-CM

## 2013-04-09 DIAGNOSIS — Z7901 Long term (current) use of anticoagulants: Secondary | ICD-10-CM

## 2013-04-09 DIAGNOSIS — G4730 Sleep apnea, unspecified: Secondary | ICD-10-CM

## 2013-04-09 DIAGNOSIS — I4891 Unspecified atrial fibrillation: Secondary | ICD-10-CM

## 2013-04-09 DIAGNOSIS — I10 Essential (primary) hypertension: Secondary | ICD-10-CM

## 2013-04-09 DIAGNOSIS — I25708 Atherosclerosis of coronary artery bypass graft(s), unspecified, with other forms of angina pectoris: Secondary | ICD-10-CM | POA: Insufficient documentation

## 2013-04-09 DIAGNOSIS — I2510 Atherosclerotic heart disease of native coronary artery without angina pectoris: Secondary | ICD-10-CM

## 2013-04-09 DIAGNOSIS — E785 Hyperlipidemia, unspecified: Secondary | ICD-10-CM

## 2013-04-09 DIAGNOSIS — I428 Other cardiomyopathies: Secondary | ICD-10-CM

## 2013-04-09 NOTE — Patient Instructions (Signed)
Your physician recommends that you continue on your current medications as directed. Please refer to the Current Medication list given to you today.   Your physician has recommended that you have a pulmonary function test. Pulmonary Function Tests are a group of tests that measure how well air moves in and out of your lungs. Please schedule when you check out today.  Your physician recommends that you schedule a follow-up appointment in: 4 months with Dr. Mayford Knife.

## 2013-04-09 NOTE — Progress Notes (Signed)
[address] [place], [place]  [address] Phone: [phone] Fax:  [phone]  Date:  04/09/2013   ID:  [name] [name], DOB [birth year], MRN [id number]  PCP:  [name], MD  Cardiologist:  Armanda Magic, MD  Electrophysiologist:  Sherryl Manges, MD   History of Present Illness: [name] [name] is a 79 y.o. male with a history of NIDCM, nonobstructive ASCAD, chronic CHF, HTN, PAF, OSA and dyslipidemia who presents for followup.  He is followed in [facility] clinic for NYHA class II-III CHF.  He is doing much better.  Recently he had some problems with hypotension and his diuretics were held for several days and he is now on an as needed basis along with daily Aldactone.  He is following a 1gm sodium diet.  His weight has been maintaining around 212-213lbs.  He denies any SOB, LE edema, palpitations or syncope.  He occasionally will have some dizziness when going from sitting to standing.   Wt Readings from Last 3 Encounters:  04/09/13 220 lb (99.791 kg)  04/07/13 221 lb 12.8 oz (100.608 kg)  04/02/13 220 lb 12.8 oz (100.154 kg)     Past Medical History  Diagnosis Date  . Nonischemic cardiomyopathy     ef 10 %  . BPH (benign prostatic hypertrophy)   . Hypothyroidism   . Left bundle branch block   . Atrial fibrillation     fibrillation/flutter  . Depression   . Sleep apnea     on CPAP.    Marland Kitchen Hyperlipidemia   . Thrombocytopenia   . Osteoarthritis   . Acute on chronic systolic heart failure   . Anemia, unspecified 04/07/2013  . Hypertension   . CHF (congestive heart failure)     Nonischemic DCM EF 10% s/p BiV AICD  . CHF (congestive heart failure)     Chronic systolic CHF    Current Outpatient Prescriptions  Medication Sig Dispense Refill  . amiodarone (PACERONE) 200 MG tablet Take 1 tablet (200 mg total) by mouth daily.  30 tablet  3  . aspirin 81 MG tablet Take 81 mg by mouth daily.      . carvedilol (COREG) 3.125 MG tablet Take 1 tablet by  mouth 2 (two) times daily.      . digoxin (LANOXIN) 0.125 MG tablet Take 1 tablet (0.125 mg total) by mouth every other day.  30 tablet  11  . levothyroxine (SYNTHROID, LEVOTHROID) 125 MCG tablet Take 125 mcg by mouth daily before breakfast.       . potassium chloride SA (K-DUR,KLOR-CON) 20 MEQ tablet Take 20 mEq by mouth every morning.       . ramipril (ALTACE) 2.5 MG tablet Take 2.5 mg by mouth every evening.       . simvastatin (ZOCOR) 10 MG tablet Take 10 mg by mouth every evening.       Marland Kitchen spironolactone (ALDACTONE) 12.5 mg TABS tablet Take 1 tablet by mouth daily  15 tablet  11  . torsemide (DEMADEX) 20 MG tablet Take 20 mg by mouth as needed. As needed for weight 215 or greater. Take 20 mg      . warfarin (COUMADIN) 5 MG tablet Take1 and 1/2 tablet tonight then Take one tab (5 mg) on Tues & Thurs & 1/2 tab (2.5mg ) on all other days       No current facility-administered medications for this visit.    Allergies:   No Known Allergies  Social History:  The patient  reports that he has never smoked. He does not have any smokeless tobacco history on file. He reports that he drinks alcohol. He reports that he does not use illicit drugs.   Family History:  The patient's family history includes Cancer in his maternal uncle; Heart disease in his father; Heart failure in his mother.   ROS:  Please see the history of present illness.      All other systems reviewed and negative.   PHYSICAL EXAM: VS:  BP 122/74  Pulse 71  Ht 6\' 2"  (1.88 m)  Wt 220 lb (99.791 kg)  BMI 28.23 kg/m2  SpO2 98% Well nourished, well developed, in no acute distress HEENT: normal Neck: no JVD Cardiac:  normal S1, S2; RRR; no murmur Lungs:  clear to auscultation bilaterally, no wheezing, rhonchi or rales Abd: soft, nontender, no hepatomegaly Ext: no edema Skin: warm and dry Neuro:  CNs 2-12 intact, no focal abnormalities noted       ASSESSMENT AND PLAN:  1. PAF maintaining NSR on Amiodarone  - continue  Amio/Coreg/Warfarin  - he is up to date on blood work monitoring for Amio but needs PFTs which we will schedule 2.  Chronic systolic CHF - compensated with stable weight  - he will continue to monitor his weights and sodium intake  - continue Torsemide on as needed basis if weight >215lbs  - continue every other day digoxin  - continue Aldactone/ACE I/Coreg 3.  Dyslipidemia  - continue simvastatin 4.  HTN controlled 5.  OSA on CPAP   - I will get a download from his DME  Followup with me in 4 months - he sees CHF clinic in 2 months  Signed, Armanda Magic, MD 04/09/2013 9:24 AM

## 2013-04-15 ENCOUNTER — Ambulatory Visit (HOSPITAL_COMMUNITY)
Admission: RE | Admit: 2013-04-15 | Discharge: 2013-04-15 | Disposition: A | Discharge status: Home / Self Care | Payer: Medicare Other | Source: Ambulatory Visit | Attending: Cardiology | Admitting: Cardiology

## 2013-04-15 ENCOUNTER — Encounter: Payer: Self-pay | Admitting: Internal Medicine

## 2013-04-15 DIAGNOSIS — Z79899 Other long term (current) drug therapy: Secondary | ICD-10-CM | POA: Insufficient documentation

## 2013-04-15 DIAGNOSIS — I4891 Unspecified atrial fibrillation: Secondary | ICD-10-CM | POA: Insufficient documentation

## 2013-04-22 ENCOUNTER — Encounter: Payer: Medicare Other | Admitting: Internal Medicine

## 2013-04-30 ENCOUNTER — Other Ambulatory Visit: Payer: Self-pay

## 2013-05-04 ENCOUNTER — Encounter: Payer: Self-pay | Admitting: Cardiology

## 2013-05-15 ENCOUNTER — Ambulatory Visit (INDEPENDENT_AMBULATORY_CARE_PROVIDER_SITE_OTHER): Payer: Medicare Other | Admitting: Internal Medicine

## 2013-05-15 ENCOUNTER — Encounter: Payer: Self-pay | Admitting: Internal Medicine

## 2013-05-15 VITALS — BP 100/60 | HR 71 | Ht 76.0 in | Wt 218.1 lb

## 2013-05-15 DIAGNOSIS — I428 Other cardiomyopathies: Secondary | ICD-10-CM

## 2013-05-15 DIAGNOSIS — I509 Heart failure, unspecified: Secondary | ICD-10-CM

## 2013-05-15 DIAGNOSIS — I4891 Unspecified atrial fibrillation: Secondary | ICD-10-CM

## 2013-05-15 DIAGNOSIS — I5022 Chronic systolic (congestive) heart failure: Secondary | ICD-10-CM

## 2013-05-15 DIAGNOSIS — Z95810 Presence of automatic (implantable) cardiac defibrillator: Secondary | ICD-10-CM

## 2013-05-15 NOTE — Assessment & Plan Note (Signed)
The patient's device was interrogated.  The information was reviewed. No changes were made in the programming.    

## 2013-05-15 NOTE — Progress Notes (Signed)
Patient Care Team: [name], MD as PCP - General (Family Medicine) Quintella Reichert, MD as Consulting Physician (Cardiology)   HPI  [name] [name] is a 79 y.o. male Seen in followup for an ICD implanted for primary prevention in the setting of nonischemic cardiomyopathy identified and confirmed by catheterization in 2008. Because of symptoms of heart failure and left bundle branch block he underwent CRT-D implantation with a Medtronic device and a 6947 defibrillator lead. He had a lazerus-like effect.    He also has a history of atrial fibrillation For this he was treated with amiodarone. Surveillance labs 9/14 normal   He would like to continue to follow His icd with [place].  He denies chest pain shortness of breath or peripheral edema. He has had some episodes where he has noted his heart rate fast with episodes of atrial fibrillation. He takes amiodarone which is followed by Dr. Mayford Knife. He is on warfarin   Past Medical History  Diagnosis Date  . Nonischemic cardiomyopathy     ef 10 %  . BPH (benign prostatic hypertrophy)   . Hypothyroidism   . Left bundle branch block   . Atrial fibrillation     fibrillation/flutter  . Depression   . Sleep apnea     on CPAP.    Marland Kitchen Hyperlipidemia   . Thrombocytopenia   . Osteoarthritis   . Acute on chronic systolic heart failure   . Anemia, unspecified 04/07/2013  . Hypertension   . CHF (congestive heart failure)     Nonischemic DCM EF 10% s/p BiV AICD  . CHF (congestive heart failure)     Chronic systolic CHF    Past Surgical History  Procedure Laterality Date  . Transurethral resection of prostate      for BPH  . Cholecystectomy    . Colonscopy      reportedly negative per pt; around 2005.    . Cardioversion N/A 12/29/2012    Procedure: CARDIOVERSION;  Surgeon: Quintella Reichert, MD;  Location: [facility];  Service: Cardiovascular;  Laterality: N/A;    Current Outpatient Prescriptions  Medication Sig Dispense  Refill  . amiodarone (PACERONE) 200 MG tablet Take 1 tablet (200 mg total) by mouth daily.  30 tablet  3  . aspirin 81 MG tablet Take 81 mg by mouth daily.      . carvedilol (COREG) 3.125 MG tablet Take 1 tablet by mouth 2 (two) times daily.      . digoxin (LANOXIN) 0.125 MG tablet Take 1 tablet (0.125 mg total) by mouth every other day.  30 tablet  11  . levothyroxine (SYNTHROID, LEVOTHROID) 125 MCG tablet Take 125 mcg by mouth daily before breakfast.       . potassium chloride SA (K-DUR,KLOR-CON) 20 MEQ tablet Take 20 mEq by mouth as needed.       . ramipril (ALTACE) 2.5 MG tablet Take 2.5 mg by mouth every evening.       . simvastatin (ZOCOR) 10 MG tablet Take 10 mg by mouth every evening.       Marland Kitchen spironolactone (ALDACTONE) 12.5 mg TABS tablet Take 1 tablet by mouth daily  15 tablet  11  . torsemide (DEMADEX) 20 MG tablet Take 20 mg by mouth as needed. As needed for weight 215 or greater. Take 20 mg      . warfarin (COUMADIN) 5 MG tablet Take1 and 1/2 tablet tonight then Take one tab (5 mg) on Tues & Thurs &  1/2 tab (2.5mg ) on all other days       No current facility-administered medications for this visit.    No Known Allergies  Review of Systems negative except from HPI and PMH  Physical Exam BP 100/60  Pulse 71  Ht 6\' 4"  (1.93 m)  Wt 218 lb 1.9 oz (98.939 kg)  BMI 26.56 kg/m2 Well developed and well nourished in no acute distress HENT normal E scleral and icterus clear Neck Supple JVP flat; carotids brisk and full Clear to ausculation  Regular rate and rhythm, no murmurs gallops or rub Soft with active bowel sounds No clubbing cyanosis none Edema Alert and oriented, grossly normal motor and sensory function Skin Warm and Dry  AV pacing   Assessment and  Plan

## 2013-05-15 NOTE — Assessment & Plan Note (Signed)
euvolemic 

## 2013-05-15 NOTE — Assessment & Plan Note (Signed)
contniue current meds  

## 2013-05-15 NOTE — Patient Instructions (Signed)
Your physician recommends that you continue on your current medications as directed. Please refer to the Current Medication list given to you today.  Remote monitoring is used to monitor your Pacemaker of ICD from home. This monitoring reduces the number of office visits required to check your device to one time per year. It allows us to keep an eye on the functioning of your device to ensure it is working properly. You are scheduled for a device check from home on 08/17/2013. You may send your transmission at any time that day. If you have a wireless device, the transmission will be sent automatically. After your physician reviews your transmission, you will receive a postcard with your next transmission date.  Your physician wants you to follow-up in: one year with Dr. Klein.  You will receive a reminder letter in the mail two months in advance. If you don't receive a letter, please call our office to schedule the follow-up appointment.    

## 2013-05-18 LAB — MDC_IDC_ENUM_SESS_TYPE_INCLINIC
Battery Voltage: 3.08 V
Brady Statistic AP VS Percent: 0.01 %
Brady Statistic AS VP Percent: 4.56 %
Brady Statistic RA Percent Paced: 95.44 %
Date Time Interrogation Session: 20141121210840
HighPow Impedance: 190 Ohm
HighPow Impedance: 380 Ohm
HighPow Impedance: 42 Ohm
HighPow Impedance: 53 Ohm
Lead Channel Impedance Value: 589 Ohm
Lead Channel Pacing Threshold Amplitude: 0.875 V
Lead Channel Pacing Threshold Amplitude: 0.875 V
Lead Channel Pacing Threshold Pulse Width: 0.4 ms
Lead Channel Sensing Intrinsic Amplitude: 0.625 mV
Lead Channel Sensing Intrinsic Amplitude: 1 mV
Lead Channel Sensing Intrinsic Amplitude: 11.375 mV
Lead Channel Setting Pacing Amplitude: 2 V
Lead Channel Setting Pacing Amplitude: 2.5 V
Lead Channel Setting Pacing Pulse Width: 0.4 ms
Lead Channel Setting Pacing Pulse Width: 0.4 ms
Lead Channel Setting Sensing Sensitivity: 0.3 mV
Zone Setting Detection Interval: 300 ms
Zone Setting Detection Interval: 400 ms

## 2013-05-26 ENCOUNTER — Ambulatory Visit (HOSPITAL_COMMUNITY)
Admission: RE | Admit: 2013-05-26 | Discharge: 2013-05-26 | Disposition: A | Discharge status: Home / Self Care | Payer: Medicare Other | Source: Ambulatory Visit | Attending: Internal Medicine | Admitting: Internal Medicine

## 2013-05-26 VITALS — BP 104/64 | HR 71 | Wt 218.2 lb

## 2013-05-26 DIAGNOSIS — I428 Other cardiomyopathies: Secondary | ICD-10-CM | POA: Insufficient documentation

## 2013-05-26 DIAGNOSIS — Z7901 Long term (current) use of anticoagulants: Secondary | ICD-10-CM | POA: Insufficient documentation

## 2013-05-26 DIAGNOSIS — I10 Essential (primary) hypertension: Secondary | ICD-10-CM | POA: Insufficient documentation

## 2013-05-26 DIAGNOSIS — R5381 Other malaise: Secondary | ICD-10-CM | POA: Insufficient documentation

## 2013-05-26 DIAGNOSIS — I5022 Chronic systolic (congestive) heart failure: Secondary | ICD-10-CM | POA: Insufficient documentation

## 2013-05-26 DIAGNOSIS — I2510 Atherosclerotic heart disease of native coronary artery without angina pectoris: Secondary | ICD-10-CM | POA: Insufficient documentation

## 2013-05-26 DIAGNOSIS — I509 Heart failure, unspecified: Secondary | ICD-10-CM

## 2013-05-26 DIAGNOSIS — G4730 Sleep apnea, unspecified: Secondary | ICD-10-CM

## 2013-05-26 DIAGNOSIS — E039 Hypothyroidism, unspecified: Secondary | ICD-10-CM | POA: Insufficient documentation

## 2013-05-26 DIAGNOSIS — I4891 Unspecified atrial fibrillation: Secondary | ICD-10-CM

## 2013-05-26 DIAGNOSIS — G4733 Obstructive sleep apnea (adult) (pediatric): Secondary | ICD-10-CM | POA: Insufficient documentation

## 2013-05-26 DIAGNOSIS — E785 Hyperlipidemia, unspecified: Secondary | ICD-10-CM | POA: Insufficient documentation

## 2013-05-26 NOTE — Progress Notes (Signed)
Patient ID: [name] [name], male   DOB: 1932/04/26, 79 y.o.   MRN: [id number]  Optivol  HPI: [name] [name] is a 79 year old with a history of A tach S/P successful DC-CV 12/29/12, CAD, PAF on chronic coumadin and amiodarone, OSA- CPAP, NICM cath 2008, chronic systolic heart failure EF 25-30% (01/2013) , S/P Medtronic CRT-D 2008 and generator changed 2012, LBBB, CRI (baseline 1.7-1.8) and Hypothyroidism.   He is S/P DC-CV 12/29/12 and initially he felt good for about a week. He has had a functional decline and only able to walk a few steps. He presented [facility] ED 01/22/13 with increased dyspnea on exertion and low extremity edema. He was started on a lasix gtt 10 mg hr. Admit weight 233 lbs. Discharge weight: 214 lbs  Follow up: Has seen Dr. Mayford Knife since last visit and was set up for yearly PFTs 04/15/13 with amiodarone use. Awaiting results. Feeling pretty good. Weight at home 211-212 lbs. Takes torsemide if weight >215 lbs and he has not needed any since last visit. SBP 100-115/60-70s. Denies SOB, PND, Orthopnea or CP. Can walk about 500 ft without stopping. Trying to be more active. Denies dizziness.   Optivol: Interrogated, no crossings over threshold. Activity remains very minimal, no afib  Labs 8/21: Na 132 K 4.5 Cr 1.7 (stable)  Labs 03/02/13 NA 135 Potassium 4.6 Creatinine 1.6 Dig 1.6 TSH 3.95        04/02/13: digoxin 0.8, K+ 4.5, creatinine 1.48  ROS: All systems negative except as listed in HPI, PMH and Problem List.  Past Medical History  Diagnosis Date  . Nonischemic cardiomyopathy     ef 10 %  . BPH (benign prostatic hypertrophy)   . Hypothyroidism   . Left bundle branch block   . Atrial fibrillation     fibrillation/flutter  . Depression   . Sleep apnea     on CPAP.    Marland Kitchen Hyperlipidemia   . Thrombocytopenia   . Osteoarthritis   . Acute on chronic systolic heart failure   . Anemia, unspecified 04/07/2013  . Hypertension   . CHF (congestive heart failure)     Nonischemic DCM EF 10% s/p  BiV AICD  . CHF (congestive heart failure)     Chronic systolic CHF    Current Outpatient Prescriptions  Medication Sig Dispense Refill  . amiodarone (PACERONE) 200 MG tablet Take 1 tablet (200 mg total) by mouth daily.  30 tablet  3  . aspirin 81 MG tablet Take 81 mg by mouth daily.      . carvedilol (COREG) 3.125 MG tablet Take 1 tablet by mouth 2 (two) times daily.      . digoxin (LANOXIN) 0.125 MG tablet Take 1 tablet (0.125 mg total) by mouth every other day.  30 tablet  11  . levothyroxine (SYNTHROID, LEVOTHROID) 125 MCG tablet Take 125 mcg by mouth daily before breakfast.       . potassium chloride SA (K-DUR,KLOR-CON) 20 MEQ tablet Take 20 mEq by mouth as needed.       . ramipril (ALTACE) 2.5 MG tablet Take 2.5 mg by mouth every evening.       . simvastatin (ZOCOR) 10 MG tablet Take 10 mg by mouth every evening.       Marland Kitchen spironolactone (ALDACTONE) 25 MG tablet Take 12.5 mg by mouth daily.      Marland Kitchen torsemide (DEMADEX) 20 MG tablet Take 20 mg by mouth as needed. As needed for weight 215 or greater. Take 20 mg      .  warfarin (COUMADIN) 5 MG tablet Take1 and 1/2 tablet tonight then Take one tab (5 mg) on Tues & Thurs & 1/2 tab (2.5mg ) on all other days       No current facility-administered medications for this encounter.    Filed Vitals:   05/26/13 1325  BP: 104/64  Pulse: 71  Weight: 218 lb 4 oz (98.998 kg)  SpO2: 99%    PHYSICAL EXAM: General: Elderly appearing. No resp difficulty  HEENT: normal  Neck: supple. JVP flat. Carotids 2+ bilat; no bruits. No lymphadenopathy or thryomegaly appreciated.  Cor: PMI nondisplaced. Regular rate & rhythm. No rubs, gallops. Soft 2/6 [name]  Lungs: clear  Abdomen: soft, nontender, nondistended. No hepatosplenomegaly. No bruits or masses. Good bowel sounds.  Extremities: no cyanosis, clubbing, rash, R and LLE 1-2 no lower extremity edema.  Neuro: alert & orientedx3, cranial nerves grossly intact. moves all 4 extremities w/o difficulty. Affect  pleasant   ASSESSMENT & PLAN:  1) Chronic systolic HF, NICM 01/2013 EF 20%; Optivol CRT- - NYHA II-III symptoms and volume status stable. He remains very fatigued and believe it is related to depression. He is working on being more active and now is walking daily to neighbors house and back. Have encouraged him to get involved with the [employer] silver sneakers program, think it could be beneficial mentally and physically.   - He is watching his sodium and volume intake very closely and talked about it is ok every once in awhile to enjoy a little treat if he wants, but to just not cheat every day. - SBP 100-115 at home. Will not titrate any medications at this time. Continue spironolactone 12.5 mg daily, ramipril 2.5 mg daily and digoxin 0.125 mg every other day. Last dig level nl 03/2013. - Previously was on coreg 6.25 mg BID, but now on 3.125 mg BID. Will see if can titrate next visit. - Optivol- No crossings over threshold, no afib. Activity remains very minimal, less than an hr a day. - Reinforced the need and importance of daily weights, a low sodium diet, and fluid restriction (less than 2 L a day). Instructed to call the HF clinic if weight increases more than 3 lbs overnight or 5 lbs in a week.   2) OSA -Continue CPAP at night 3) Atrial tach/AF - maintaining SR on amio. Continue amiodarone 200 mg daily - TSH and LFTs checked 02/2013, nl - recent PFTs results pending - recommended go for a yearly eye exam. - we discussed switching to NOAC but he says he cannot afford.  Follow 4 months  Ulla Potash B NP-C 1:34 PM

## 2013-05-26 NOTE — Patient Instructions (Signed)
Doing great!!!  Try to increase activity and walk more.  Have a wonderful Christmas and New Year.  Follow up in 4 months, call if any issues.  Do the following things EVERYDAY: 1) Weigh yourself in the morning before breakfast. Write it down and keep it in a log. 2) Take your medicines as prescribed 3) Eat low salt foods-Limit salt (sodium) to 2000 mg per day.  4) Stay as active as you can everyday 5) Limit all fluids for the day to less than 2 liters 6)

## 2013-06-07 ENCOUNTER — Other Ambulatory Visit: Payer: Self-pay | Admitting: Cardiology

## 2013-06-08 ENCOUNTER — Encounter: Payer: Self-pay | Admitting: Internal Medicine

## 2013-06-23 ENCOUNTER — Other Ambulatory Visit (INDEPENDENT_AMBULATORY_CARE_PROVIDER_SITE_OTHER): Payer: Medicare Other

## 2013-06-23 DIAGNOSIS — Z79899 Other long term (current) drug therapy: Secondary | ICD-10-CM

## 2013-06-23 DIAGNOSIS — I2510 Atherosclerotic heart disease of native coronary artery without angina pectoris: Secondary | ICD-10-CM

## 2013-06-23 LAB — ALT: ALT: 21 U/L (ref 0–53)

## 2013-06-24 LAB — NMR LIPOPROFILE WITH LIPIDS
Cholesterol, Total: 118 mg/dL (ref <200)
HDL Size: 9.2 nm (ref >=9.2)
HDL-C: 41 mg/dL (ref >=40)
LDL Particle Number: 789 nmol/L (ref <1000)
LP-IR Score: 30 (ref <=45)
Large VLDL-P: <0.8 nmol/L (ref <=2.7)
Small LDL Particle Number: 100 nmol/L (ref <=527)
Triglycerides: 50 mg/dL (ref <150)
VLDL Size: 46.2 nm (ref <=46.6)

## 2013-06-26 ENCOUNTER — Encounter: Payer: Self-pay | Admitting: General Surgery

## 2013-06-26 ENCOUNTER — Other Ambulatory Visit: Payer: Self-pay | Admitting: General Surgery

## 2013-06-26 DIAGNOSIS — E785 Hyperlipidemia, unspecified: Secondary | ICD-10-CM

## 2013-08-14 ENCOUNTER — Telehealth (HOSPITAL_COMMUNITY): Payer: Self-pay | Admitting: Cardiology

## 2013-08-14 NOTE — Telephone Encounter (Signed)
Pt called with C/O increased HR at times Pt will check his B/P daily and for the last few days HR is in the 100's normally his HR is 70-77 Denies symptoms. Advised pt this did not seem to be a abnormal/new problem for him however since this may be happening more frequently would send to provider for further evaluation   Please advise

## 2013-08-17 ENCOUNTER — Ambulatory Visit (INDEPENDENT_AMBULATORY_CARE_PROVIDER_SITE_OTHER): Payer: Medicare Other | Admitting: *Deleted

## 2013-08-17 DIAGNOSIS — I498 Other specified cardiac arrhythmias: Secondary | ICD-10-CM

## 2013-08-17 DIAGNOSIS — I509 Heart failure, unspecified: Secondary | ICD-10-CM

## 2013-08-17 DIAGNOSIS — I428 Other cardiomyopathies: Secondary | ICD-10-CM

## 2013-08-17 DIAGNOSIS — I4891 Unspecified atrial fibrillation: Secondary | ICD-10-CM

## 2013-08-17 DIAGNOSIS — I471 Supraventricular tachycardia: Secondary | ICD-10-CM

## 2013-08-17 LAB — MDC_IDC_ENUM_SESS_TYPE_REMOTE
Brady Statistic AP VP Percent: 90.15 %
Brady Statistic AS VP Percent: 9.84 %
Brady Statistic AS VS Percent: 0 %
Brady Statistic RA Percent Paced: 90.16 %
Brady Statistic RV Percent Paced: 99.99 %
Date Time Interrogation Session: 20150223141602
HIGH POWER IMPEDANCE MEASURED VALUE: 41 Ohm
HIGH POWER IMPEDANCE MEASURED VALUE: 53 Ohm
HighPow Impedance: 190 Ohm
HighPow Impedance: 437 Ohm
Lead Channel Impedance Value: 399 Ohm
Lead Channel Pacing Threshold Amplitude: 0.75 V
Lead Channel Pacing Threshold Pulse Width: 0.4 ms
Lead Channel Pacing Threshold Pulse Width: 0.4 ms
Lead Channel Sensing Intrinsic Amplitude: 11.375 mV
Lead Channel Setting Pacing Amplitude: 2.5 V
Lead Channel Setting Pacing Pulse Width: 0.4 ms
Lead Channel Setting Pacing Pulse Width: 0.4 ms
MDC IDC MSMT BATTERY VOLTAGE: 3.05 V
MDC IDC MSMT LEADCHNL LV IMPEDANCE VALUE: 1026 Ohm
MDC IDC MSMT LEADCHNL LV IMPEDANCE VALUE: 570 Ohm
MDC IDC MSMT LEADCHNL LV IMPEDANCE VALUE: 589 Ohm
MDC IDC MSMT LEADCHNL LV PACING THRESHOLD AMPLITUDE: 1 V
MDC IDC MSMT LEADCHNL RA PACING THRESHOLD AMPLITUDE: 0.75 V
MDC IDC MSMT LEADCHNL RA PACING THRESHOLD PULSEWIDTH: 0.4 ms
MDC IDC MSMT LEADCHNL RA SENSING INTR AMPL: 0.625 mV
MDC IDC MSMT LEADCHNL RA SENSING INTR AMPL: 0.625 mV
MDC IDC MSMT LEADCHNL RV IMPEDANCE VALUE: 437 Ohm
MDC IDC MSMT LEADCHNL RV SENSING INTR AMPL: 11.375 mV
MDC IDC SET LEADCHNL LV PACING AMPLITUDE: 2.5 V
MDC IDC SET LEADCHNL RA PACING AMPLITUDE: 2 V
MDC IDC SET LEADCHNL RV SENSING SENSITIVITY: 0.3 mV
MDC IDC SET ZONE DETECTION INTERVAL: 300 ms
MDC IDC SET ZONE DETECTION INTERVAL: 360 ms
MDC IDC STAT BRADY AP VS PERCENT: 0.01 %
Zone Setting Detection Interval: 340 ms
Zone Setting Detection Interval: 400 ms

## 2013-08-18 ENCOUNTER — Ambulatory Visit: Payer: Medicare Other | Admitting: Cardiology

## 2013-08-21 ENCOUNTER — Encounter: Payer: Self-pay | Admitting: Oncology

## 2013-08-24 ENCOUNTER — Telehealth: Payer: Self-pay | Admitting: Internal Medicine

## 2013-08-24 NOTE — Telephone Encounter (Signed)
New message     Did you receive his defibulator remote transmission?

## 2013-08-24 NOTE — Telephone Encounter (Signed)
Transmission received on 2-23 and 2-27. Pt aware/kwm

## 2013-09-08 ENCOUNTER — Encounter: Payer: Self-pay | Admitting: Cardiology

## 2013-09-08 ENCOUNTER — Ambulatory Visit (INDEPENDENT_AMBULATORY_CARE_PROVIDER_SITE_OTHER): Payer: Medicare Other | Admitting: Cardiology

## 2013-09-08 VITALS — BP 95/62 | HR 89 | Ht 76.0 in

## 2013-09-08 DIAGNOSIS — E785 Hyperlipidemia, unspecified: Secondary | ICD-10-CM

## 2013-09-08 DIAGNOSIS — I10 Essential (primary) hypertension: Secondary | ICD-10-CM

## 2013-09-08 DIAGNOSIS — Z7901 Long term (current) use of anticoagulants: Secondary | ICD-10-CM

## 2013-09-08 DIAGNOSIS — I5022 Chronic systolic (congestive) heart failure: Secondary | ICD-10-CM

## 2013-09-08 DIAGNOSIS — G4733 Obstructive sleep apnea (adult) (pediatric): Secondary | ICD-10-CM

## 2013-09-08 DIAGNOSIS — I428 Other cardiomyopathies: Secondary | ICD-10-CM

## 2013-09-08 DIAGNOSIS — I4891 Unspecified atrial fibrillation: Secondary | ICD-10-CM

## 2013-09-08 LAB — BASIC METABOLIC PANEL
BUN: 22 mg/dL (ref 6–23)
CALCIUM: 8.8 mg/dL (ref 8.4–10.5)
CO2: 26 mEq/L (ref 19–32)
CREATININE: 1.7 mg/dL — AB (ref 0.4–1.5)
Chloride: 100 mEq/L (ref 96–112)
GFR: 42.95 mL/min — AB (ref >60.00)
Glucose, Bld: 148 mg/dL — ABNORMAL HIGH (ref 70–99)
Potassium: 4.4 mEq/L (ref 3.5–5.1)
SODIUM: 135 meq/L (ref 135–145)

## 2013-09-08 NOTE — Progress Notes (Signed)
[address] [place], [place]  [address] Phone: [phone] Fax:  [phone]  Date:  09/08/2013   ID:  [name] [name], DOB 09/29/1931, MRN [id number]  PCP:  Gaye AlkenBARNES,ELIZABETH STEWART, MD  Cardiologist:  Armanda Magicraci Turner, MD     History of Present Illness: [name] EdisVernon E Polcyn is a 80 y.o. male with a history of NIDCM, nonobstructive ASCAD, chronic CHF, HTN, PAF, OSA and dyslipidemia who presents for followup. [facility] for NYHA class II-III CHF. He is doing much better. He is following a 1gm sodium diet. His weight has been maintaining around 209lbs. He denies any SOB, LE edema, palpitations or syncope. He occasionally will have some dizziness when going from sitting to standing.He is tolerating his CPAP device and feels the pressure is adequate.  He has some problems with the mask with leaking in the middle of the night.  He is going to get a new mask.      Wt Readings from Last 3 Encounters:  05/26/13 218 lb 4 oz (98.998 kg)  05/15/13 218 lb 1.9 oz (98.939 kg)  04/09/13 220 lb (99.791 kg)     Past Medical History  Diagnosis Date  . Nonischemic cardiomyopathy     ef 10 %  . BPH (benign prostatic hypertrophy)   . Hypothyroidism   . Left bundle branch block   . Atrial fibrillation     fibrillation/flutter  . Depression   . Sleep apnea     on CPAP.    Marland Kitchen. Hyperlipidemia   . Thrombocytopenia   . Osteoarthritis   . Acute on chronic systolic heart failure   . Anemia, unspecified 04/07/2013  . Hypertension   . CHF (congestive heart failure)     Nonischemic DCM EF 10% s/p BiV AICD  . CHF (congestive heart failure)     Chronic systolic CHF    Current Outpatient Prescriptions  Medication Sig Dispense Refill  . amiodarone (PACERONE) 200 MG tablet Take 1 tablet (200 mg total) by mouth daily.  30 tablet  3  . aspirin 81 MG tablet Take 81 mg by mouth daily.      . carvedilol (COREG) 3.125 MG tablet Take 1 tablet by mouth 2 (two) times daily.        . digoxin (LANOXIN) 0.125 MG tablet Take 1 tablet (0.125 mg total) by mouth every other day.  30 tablet  11  . levothyroxine (SYNTHROID, LEVOTHROID) 125 MCG tablet Take 125 mcg by mouth daily before breakfast.       . potassium chloride SA (K-DUR,KLOR-CON) 20 MEQ tablet Take 20 mEq by mouth as needed.       . ramipril (ALTACE) 2.5 MG tablet Take 2.5 mg by mouth every evening.       . simvastatin (ZOCOR) 10 MG tablet Take 10 mg by mouth every evening.       Marland Kitchen. spironolactone (ALDACTONE) 25 MG tablet take 1/2 tablet by mouth once daily  15 tablet  3  . torsemide (DEMADEX) 20 MG tablet Take 20 mg by mouth as needed. As needed for weight 215 or greater. Take 20 mg      . warfarin (COUMADIN) 5 MG tablet Take1 and 1/2 tablet tonight then Take one tab (5 mg) on Tues & Thurs & 1/2 tab (2.5mg ) on all other days       No current facility-administered medications for this visit.    Allergies:   No Known Allergies  Social History:  The  patient  reports that he has never smoked. He does not have any smokeless tobacco history on file. He reports that he drinks alcohol. He reports that he does not use illicit drugs.   Family History:  The patient's family history includes Cancer in his maternal uncle; Heart disease in his father; Heart failure in his mother.   ROS:  Please see the history of present illness.      All other systems reviewed and negative.   PHYSICAL EXAM: VS:  BP 95/62  Pulse 89  Ht 6\' 4"  (1.93 m) Well nourished, well developed, in no acute distress HEENT: normal Neck: no JVD Cardiac:  normal S1, S2; RRR; no murmur Lungs:  clear to auscultation bilaterally, no wheezing, rhonchi or rales Abd: soft, nontender, no hepatomegaly Ext: no edema Skin: warm and dry Neuro:  CNs 2-12 intact, no focal abnormalities noted     ASSESSMENT AND PLAN:  1.  PAF maintaining NSR on Amiodarone - continue Amio/Coreg/Warfarin  - he is up to date on blood work and PFT monitoring for Amio - I told him  to stop his ASA 2. Chronic systolic CHF - compensated with stable weight  - he will continue to monitor his weights and sodium intake.  His weight is maintaining around 209lbs.    - continue Torsemide on as needed basis if weight >215lbs  - continue every other day digoxin  - continue Aldactone/ACE I/Coreg  - check dig level and BMET 3. Dyslipidemia - at goal - continue simvastatin  4. HTN controlled  5. OSA on CPAP  - I will get a download from his DME   Followup with me in 4 months      Signed, Armanda Magic, MD 09/08/2013 8:56 AM

## 2013-09-08 NOTE — Patient Instructions (Signed)
Your physician has recommended you make the following change in your medication: 1. Stop Aspirin  Your physician recommends that you go to the lab today for a Dig level and BMET  Your physician recommends that you schedule a follow-up appointment in: 4 Months with Dr Mayford Knife

## 2013-09-09 ENCOUNTER — Encounter: Payer: Self-pay | Admitting: General Surgery

## 2013-09-09 LAB — DIGOXIN LEVEL: Digoxin Level: 0.7 ng/mL — ABNORMAL LOW (ref 0.8–2.0)

## 2013-09-14 ENCOUNTER — Encounter: Payer: Self-pay | Admitting: *Deleted

## 2013-09-22 ENCOUNTER — Encounter: Payer: Self-pay | Admitting: Internal Medicine

## 2013-10-02 ENCOUNTER — Other Ambulatory Visit: Payer: Self-pay

## 2013-10-02 MED ORDER — SPIRONOLACTONE 25 MG PO TABS: ORAL_TABLET | ORAL | Status: DC

## 2013-10-08 NOTE — Progress Notes (Signed)
Patient ID: [name] [name], male   DOB: 09/20/1931, 80 y.o.   MRN: [id number] Primary Cardiologist: Dr Mayford Knifeurner PCP: Dr Arlee MuslimBarnes Optivol  HPI: [name] [name] is a 80 year old with a history of A tach S/P successful DC-CV 12/29/12, CAD, PAF on chronic coumadin and amiodarone, OSA- CPAP, NICM cath 2008, chronic systolic heart failure EF 25-30% (01/2013) , S/P Medtronic CRT-D 2008 and generator changed 2012, LBBB, CRI (baseline 1.7-1.8) and Hypothyroidism.   He is S/P DC-CV 12/29/12 and initially he felt good for about a week. He has had a functional decline and only able to walk a few steps. [facility] with increased dyspnea on exertion and low extremity edema. He was started on a lasix gtt 10 mg hr. Admit weight 233 lbs. Discharge weight: 214 lbs  He returns for follow up. Overall he feels ok just has fatigue. Limited stamina. Denies SOB/PND/Orthopnea/CP. No bleeding problems. Compliant with medications. Minimal activity at home due to knee pain. Weight at home 208-210 pounds. He has not required Torsemide. Using CPAP nightly.   Optivol: Interrogated, no crossings over threshold. Activity < 1 hour.   Labs 8/21: Na 132 K 4.5 Cr 1.7 (stable)  Labs 03/02/13 NA 135 Potassium 4.6 Creatinine 1.6 Dig 1.6 TSH 3.95        04/02/13: digoxin 0.8, K+ 4.5, creatinine 1.48         09/08/13 K 4.4 Creatine 1.7   ROS: All systems negative except as listed in HPI, PMH and Problem List.  Past Medical History  Diagnosis Date  . Nonischemic cardiomyopathy     ef 10 %  . BPH (benign prostatic hypertrophy)   . Hypothyroidism   . Left bundle branch block   . Atrial fibrillation     fibrillation/flutter  . Depression   . Sleep apnea     on CPAP.    Marland Kitchen. Hyperlipidemia   . Thrombocytopenia   . Osteoarthritis   . Acute on chronic systolic heart failure   . Anemia, unspecified 04/07/2013  . Hypertension   . CHF (congestive heart failure)     Nonischemic DCM EF 10% s/p BiV AICD  . CHF (congestive heart failure)      Chronic systolic CHF    Current Outpatient Prescriptions  Medication Sig Dispense Refill  . amiodarone (PACERONE) 200 MG tablet Take 1 tablet (200 mg total) by mouth daily.  30 tablet  3  . carvedilol (COREG) 3.125 MG tablet Take 1 tablet by mouth 2 (two) times daily.      . digoxin (LANOXIN) 0.125 MG tablet Take 1 tablet (0.125 mg total) by mouth every other day.  30 tablet  11  . levothyroxine (SYNTHROID, LEVOTHROID) 125 MCG tablet Take 125 mcg by mouth daily before breakfast.       . ramipril (ALTACE) 2.5 MG tablet Take 2.5 mg by mouth every evening.       . simvastatin (ZOCOR) 10 MG tablet Take 10 mg by mouth every evening.       Marland Kitchen. spironolactone (ALDACTONE) 25 MG tablet take 1/2 tablet by mouth once daily  15 tablet  3  . warfarin (COUMADIN) 5 MG tablet Take1 and 1/2 tablet tonight then Take one tab (5 mg) on Tues & Thurs & 1/2 tab (2.5mg ) on all other days      . potassium chloride SA (K-DUR,KLOR-CON) 20 MEQ tablet Take 20 mEq by mouth as needed.       . torsemide (DEMADEX) 20 MG tablet Take 20  mg by mouth as needed. As needed for weight 215 or greater. Take 20 mg       No current facility-administered medications for this encounter.    Filed Vitals:   10/09/13 1007  BP: 120/71  Pulse: 72  Height: 6\' 2"  (1.88 m)  Weight: 214 lb 3.2 oz (97.16 kg)  SpO2: 99%    PHYSICAL EXAM: General: Elderly appearing. No resp difficulty Ambulated into the clinic with a cane.  HEENT: normal  Neck: supple. JVP flat. Carotids 2+ bilat; no bruits. No lymphadenopathy or thryomegaly appreciated.  Cor: PMI nondisplaced. Regular rate & rhythm. No rubs, gallops. Soft 2/6 [name]  Lungs: clear  Abdomen: soft, nontender, nondistended. No hepatosplenomegaly. No bruits or masses. Good bowel sounds.  Extremities: no cyanosis, clubbing, rash, R and LLE trace to 1+ edema.  Neuro: alert & orientedx3, cranial nerves grossly intact. moves all 4 extremities w/o difficulty. Affect pleasant   ASSESSMENT &  PLAN:  1) Chronic systolic HF, NICM 01/2013 EF 20%; Optivol CRT- - NYHA II-III symptoms. Volume status stable. Continue torsemide as needed for weight > 215 pounds.  Continue spironolactone 12.5 mg daily Continue carvedilol 3.125 mg twice a day wll not increase due to fatigue and continue digoxin 0.125 mg every other day. Last dig level nl 03/2013.  Continue  ramipril 2.5 mg daily - Optivol- No crossings over threshold, no afib. Activity < 1 hour per day.  - Reinforced the need and importance of daily weights, a low sodium diet, and fluid restriction (less than 2 L a day). Instructed to call the HF clinic if weight increases more than 3 lbs overnight or 5 lbs in a week.   2) OSA-Continue CPAP at night 3) Atrial tach/AF-- Rate controlled.  Continue amiodarone 200 mg daily - TSH and LFTs checked 02/2013, nl. - recommended go for a yearly eye exam. - In the past offered NOAC however he can not afford. Continue coumadin. This managed by Dr Zachery Dauer.   Follow up in 4 months  [name] Baines D Louetta Hollingshead NP-C 10:21 AM

## 2013-10-09 ENCOUNTER — Ambulatory Visit (HOSPITAL_COMMUNITY)
Admission: RE | Admit: 2013-10-09 | Discharge: 2013-10-09 | Disposition: A | Discharge status: Home / Self Care | Payer: Medicare Other | Source: Ambulatory Visit | Attending: Internal Medicine | Admitting: Internal Medicine

## 2013-10-09 VITALS — BP 120/71 | HR 72 | Ht 74.0 in | Wt 214.2 lb

## 2013-10-09 DIAGNOSIS — I471 Supraventricular tachycardia: Secondary | ICD-10-CM

## 2013-10-09 DIAGNOSIS — G4733 Obstructive sleep apnea (adult) (pediatric): Secondary | ICD-10-CM

## 2013-10-09 DIAGNOSIS — I5022 Chronic systolic (congestive) heart failure: Secondary | ICD-10-CM | POA: Insufficient documentation

## 2013-10-09 DIAGNOSIS — I498 Other specified cardiac arrhythmias: Secondary | ICD-10-CM | POA: Insufficient documentation

## 2013-10-09 NOTE — Patient Instructions (Signed)
Follow up in 3 months   Do the following things EVERYDAY: 1) Weigh yourself in the morning before breakfast. Write it down and keep it in a log. 2) Take your medicines as prescribed 3) Eat low salt foods-Limit salt (sodium) to 2000 mg per day.  4) Stay as active as you can everyday 5) Limit all fluids for the day to less than 2 liters  

## 2013-11-17 ENCOUNTER — Encounter: Payer: Self-pay | Admitting: Internal Medicine

## 2013-11-18 ENCOUNTER — Ambulatory Visit (INDEPENDENT_AMBULATORY_CARE_PROVIDER_SITE_OTHER): Payer: Medicare Other | Admitting: *Deleted

## 2013-11-18 ENCOUNTER — Encounter: Payer: Self-pay | Admitting: Internal Medicine

## 2013-11-18 DIAGNOSIS — I428 Other cardiomyopathies: Secondary | ICD-10-CM

## 2013-11-18 NOTE — Progress Notes (Signed)
Remote ICD transmission.   

## 2013-11-24 ENCOUNTER — Other Ambulatory Visit: Payer: Medicare Other

## 2013-11-30 ENCOUNTER — Other Ambulatory Visit: Payer: Self-pay

## 2013-11-30 MED ORDER — RAMIPRIL 2.5 MG PO CAPS: 2.5000 mg | ORAL_CAPSULE | Freq: Every day | ORAL | Status: DC

## 2013-12-01 LAB — MDC_IDC_ENUM_SESS_TYPE_REMOTE
Battery Voltage: 3.04 V
Brady Statistic AP VP Percent: 87.84 %
Brady Statistic AS VP Percent: 12.13 %
Brady Statistic RA Percent Paced: 87.86 %
Brady Statistic RV Percent Paced: 99.97 %
HighPow Impedance: 190 Ohm
HighPow Impedance: 342 Ohm
HighPow Impedance: 42 Ohm
HighPow Impedance: 53 Ohm
Lead Channel Impedance Value: 437 Ohm
Lead Channel Impedance Value: 437 Ohm
Lead Channel Pacing Threshold Amplitude: 0.875 V
Lead Channel Pacing Threshold Pulse Width: 0.4 ms
Lead Channel Sensing Intrinsic Amplitude: 1 mV
Lead Channel Sensing Intrinsic Amplitude: 1 mV
Lead Channel Sensing Intrinsic Amplitude: 11.375 mV
Lead Channel Setting Pacing Amplitude: 2 V
Lead Channel Setting Pacing Amplitude: 2.5 V
Lead Channel Setting Pacing Pulse Width: 0.4 ms
MDC IDC MSMT LEADCHNL LV IMPEDANCE VALUE: 532 Ohm
MDC IDC MSMT LEADCHNL LV IMPEDANCE VALUE: 570 Ohm
MDC IDC MSMT LEADCHNL LV IMPEDANCE VALUE: 950 Ohm
MDC IDC MSMT LEADCHNL LV PACING THRESHOLD AMPLITUDE: 1 V
MDC IDC MSMT LEADCHNL LV PACING THRESHOLD PULSEWIDTH: 0.4 ms
MDC IDC MSMT LEADCHNL RA PACING THRESHOLD AMPLITUDE: 0.75 V
MDC IDC MSMT LEADCHNL RA PACING THRESHOLD PULSEWIDTH: 0.4 ms
MDC IDC MSMT LEADCHNL RV SENSING INTR AMPL: 11.375 mV
MDC IDC SESS DTM: 20150527142353
MDC IDC SET LEADCHNL LV PACING PULSEWIDTH: 0.4 ms
MDC IDC SET LEADCHNL RV PACING AMPLITUDE: 2.5 V
MDC IDC SET LEADCHNL RV SENSING SENSITIVITY: 0.3 mV
MDC IDC SET ZONE DETECTION INTERVAL: 340 ms
MDC IDC STAT BRADY AP VS PERCENT: 0.02 %
MDC IDC STAT BRADY AS VS PERCENT: 0.01 %
Zone Setting Detection Interval: 300 ms
Zone Setting Detection Interval: 360 ms
Zone Setting Detection Interval: 400 ms

## 2013-12-16 ENCOUNTER — Telehealth: Payer: Self-pay | Admitting: Internal Medicine

## 2013-12-16 NOTE — Telephone Encounter (Signed)
New message    Patient would like for you to call her did not give any information.

## 2013-12-16 NOTE — Telephone Encounter (Signed)
Pt just recently moved to [facility] and set up monitor. Transmission was received. Phone number changed in Epic with new number.

## 2013-12-21 ENCOUNTER — Other Ambulatory Visit: Payer: Self-pay

## 2013-12-21 MED ORDER — RAMIPRIL 2.5 MG PO CAPS: 2.5000 mg | ORAL_CAPSULE | Freq: Every day | ORAL | Status: DC

## 2013-12-23 ENCOUNTER — Encounter: Payer: Self-pay | Admitting: Cardiology

## 2013-12-23 ENCOUNTER — Telehealth: Payer: Self-pay | Admitting: Cardiology

## 2013-12-23 ENCOUNTER — Encounter (HOSPITAL_BASED_OUTPATIENT_CLINIC_OR_DEPARTMENT_OTHER): Payer: Self-pay | Admitting: Emergency Medicine

## 2013-12-23 ENCOUNTER — Emergency Department (HOSPITAL_BASED_OUTPATIENT_CLINIC_OR_DEPARTMENT_OTHER)
Admission: EM | Admit: 2013-12-23 | Discharge: 2013-12-23 | Disposition: A | Discharge status: Home / Self Care | Payer: Medicare Other | Attending: Emergency Medicine | Admitting: Emergency Medicine

## 2013-12-23 ENCOUNTER — Emergency Department (HOSPITAL_BASED_OUTPATIENT_CLINIC_OR_DEPARTMENT_OTHER): Payer: Medicare Other

## 2013-12-23 DIAGNOSIS — T07XXXA Unspecified multiple injuries, initial encounter: Secondary | ICD-10-CM

## 2013-12-23 DIAGNOSIS — N400 Enlarged prostate without lower urinary tract symptoms: Secondary | ICD-10-CM | POA: Insufficient documentation

## 2013-12-23 DIAGNOSIS — I428 Other cardiomyopathies: Secondary | ICD-10-CM | POA: Insufficient documentation

## 2013-12-23 DIAGNOSIS — IMO0002 Reserved for concepts with insufficient information to code with codable children: Secondary | ICD-10-CM | POA: Insufficient documentation

## 2013-12-23 DIAGNOSIS — Z9981 Dependence on supplemental oxygen: Secondary | ICD-10-CM | POA: Insufficient documentation

## 2013-12-23 DIAGNOSIS — G4730 Sleep apnea, unspecified: Secondary | ICD-10-CM | POA: Insufficient documentation

## 2013-12-23 DIAGNOSIS — I5023 Acute on chronic systolic (congestive) heart failure: Secondary | ICD-10-CM | POA: Insufficient documentation

## 2013-12-23 DIAGNOSIS — I10 Essential (primary) hypertension: Secondary | ICD-10-CM | POA: Insufficient documentation

## 2013-12-23 DIAGNOSIS — Z7901 Long term (current) use of anticoagulants: Secondary | ICD-10-CM | POA: Insufficient documentation

## 2013-12-23 DIAGNOSIS — Y9229 Other specified public building as the place of occurrence of the external cause: Secondary | ICD-10-CM | POA: Insufficient documentation

## 2013-12-23 DIAGNOSIS — Z862 Personal history of diseases of the blood and blood-forming organs and certain disorders involving the immune mechanism: Secondary | ICD-10-CM | POA: Insufficient documentation

## 2013-12-23 DIAGNOSIS — E039 Hypothyroidism, unspecified: Secondary | ICD-10-CM | POA: Insufficient documentation

## 2013-12-23 DIAGNOSIS — Y9389 Activity, other specified: Secondary | ICD-10-CM | POA: Insufficient documentation

## 2013-12-23 DIAGNOSIS — Z79899 Other long term (current) drug therapy: Secondary | ICD-10-CM | POA: Insufficient documentation

## 2013-12-23 DIAGNOSIS — Z8739 Personal history of other diseases of the musculoskeletal system and connective tissue: Secondary | ICD-10-CM | POA: Insufficient documentation

## 2013-12-23 DIAGNOSIS — R296 Repeated falls: Secondary | ICD-10-CM | POA: Insufficient documentation

## 2013-12-23 DIAGNOSIS — I4891 Unspecified atrial fibrillation: Secondary | ICD-10-CM | POA: Insufficient documentation

## 2013-12-23 LAB — CBC WITH DIFFERENTIAL/PLATELET
BASOS ABS: 0 10*3/uL (ref 0.0–0.1)
BASOS PCT: 0 % (ref 0–1)
Eosinophils Absolute: 0.1 10*3/uL (ref 0.0–0.7)
Eosinophils Relative: 1 % (ref 0–5)
HCT: 41 % (ref 39.0–52.0)
HEMOGLOBIN: 13.5 g/dL (ref 13.0–17.0)
Lymphocytes Relative: 9 % — ABNORMAL LOW (ref 12–46)
Lymphs Abs: 0.7 10*3/uL (ref 0.7–4.0)
MCH: 31 pg (ref 26.0–34.0)
MCHC: 32.9 g/dL (ref 30.0–36.0)
MCV: 94.3 fL (ref 78.0–100.0)
Monocytes Absolute: 0.7 10*3/uL (ref 0.1–1.0)
Monocytes Relative: 8 % (ref 3–12)
NEUTROS ABS: 6.5 10*3/uL (ref 1.7–7.7)
Neutrophils Relative %: 82 % — ABNORMAL HIGH (ref 43–77)
Platelets: 154 10*3/uL (ref 150–400)
RBC: 4.35 MIL/uL (ref 4.22–5.81)
RDW: 14 % (ref 11.5–15.5)
WBC: 8 10*3/uL (ref 4.0–10.5)

## 2013-12-23 LAB — BASIC METABOLIC PANEL
ANION GAP: 14 (ref 5–15)
BUN: 22 mg/dL (ref 6–23)
CHLORIDE: 97 meq/L (ref 96–112)
CO2: 24 mEq/L (ref 19–32)
Calcium: 9.3 mg/dL (ref 8.4–10.5)
Creatinine, Ser: 1.6 mg/dL — ABNORMAL HIGH (ref 0.50–1.35)
GFR calc non Af Amer: 39 mL/min — ABNORMAL LOW (ref >90)
GFR, EST AFRICAN AMERICAN: 46 mL/min — AB (ref >90)
Glucose, Bld: 162 mg/dL — ABNORMAL HIGH (ref 70–99)
POTASSIUM: 4.4 meq/L (ref 3.7–5.3)
Sodium: 135 mEq/L — ABNORMAL LOW (ref 137–147)

## 2013-12-23 LAB — PROTIME-INR
INR: 2.81 — AB (ref 0.00–1.49)
Prothrombin Time: 29.6 seconds — ABNORMAL HIGH (ref 11.6–15.2)

## 2013-12-23 NOTE — Progress Notes (Signed)
His ICD device did not show any ICD discharge, he did have atrial tach but ventricular rate only at 109.

## 2013-12-23 NOTE — ED Notes (Addendum)
Pt c/o head injury hitting head on metal grocery cart x 2 hrs ago laceration to left side of head , pt is on coumadin

## 2013-12-23 NOTE — ED Provider Notes (Signed)
CSN: [id number]     Arrival date & time 12/23/13  1810 History  This chart was scribed for Rolland Porter, MD by Leone Payor, ED Scribe. This patient was seen in room MH05/MH05 and the patient's care was started 7:13 PM.    Chief Complaint  Patient presents with  . Head Injury    The history is provided by the patient and a relative. No language interpreter was used.    HPI Comments: [name] [name] is a 81 y.o. male with past medical history of Afib, CHF, HTN who presents to the Emergency Department complaining of a head injury that occurred about 2 hours ago. Patient states he was leaning over to lift an object out of a metal grocery cart when he lost control and fell over. Patient states he was unable to lift himself out of the grocery cart after the fall. Patient states he has been having trouble with his balance recently and has difficulty standing after leaning over. He takes coumadin regularly; last INR was 3.4 so he was directed to hold a dose of his coumadin yesterday. He denies palpitations or any other injuries.   Past Medical History  Diagnosis Date  . Nonischemic cardiomyopathy     ef 10 %  . BPH (benign prostatic hypertrophy)   . Hypothyroidism   . Left bundle branch block   . Atrial fibrillation     fibrillation/flutter  . Depression   . Sleep apnea     on CPAP.    Marland Kitchen Hyperlipidemia   . Thrombocytopenia   . Osteoarthritis   . Acute on chronic systolic heart failure   . Anemia, unspecified 04/07/2013  . Hypertension   . CHF (congestive heart failure)     Nonischemic DCM EF 10% s/p BiV AICD  . CHF (congestive heart failure)     Chronic systolic CHF   Past Surgical History  Procedure Laterality Date  . Transurethral resection of prostate      for BPH  . Cholecystectomy    . Colonscopy      reportedly negative per pt; around 2005.    . Cardioversion N/A 12/29/2012    Procedure: CARDIOVERSION;  Surgeon: Quintella Reichert, MD;  Location: [facility];  Service:  Cardiovascular;  Laterality: N/A;   Family History  Problem Relation Age of Onset  . Heart failure Mother   . Heart disease Father   . Cancer Maternal Uncle     Unknown subtype   History  Substance Use Topics  . Smoking status: Never Smoker   . Smokeless tobacco: Not on file  . Alcohol Use: Yes     Comment: occasionally    Review of Systems  Constitutional: Negative.   HENT: Negative.   Eyes: Negative.   Respiratory: Negative.   Cardiovascular: Negative.   Gastrointestinal: Negative.   Skin: Positive for wound (abrasions to head).  Neurological: Negative.  Negative for syncope.  Psychiatric/Behavioral: Negative.   All other systems reviewed and are negative.     Allergies  Review of patient's allergies indicates no known allergies.  Home Medications   Prior to Admission medications   Medication Sig Start Date End Date Taking? Authorizing Provider  amiodarone (PACERONE) 200 MG tablet Take 1 tablet (200 mg total) by mouth daily. 02/17/13   Dolores Patty, MD  carvedilol (COREG) 3.125 MG tablet Take 1 tablet by mouth 2 (two) times daily. 04/01/13   Historical Provider, MD  digoxin (LANOXIN) 0.125 MG tablet Take 1 tablet (0.125 mg total) by  mouth every other day. 03/05/13   Dolores Patty, MD  levothyroxine (SYNTHROID, LEVOTHROID) 125 MCG tablet Take 125 mcg by mouth daily before breakfast.     Historical Provider, MD  potassium chloride SA (K-DUR,KLOR-CON) 20 MEQ tablet Take 20 mEq by mouth as needed.     Historical Provider, MD  ramipril (ALTACE) 2.5 MG capsule Take 1 capsule (2.5 mg total) by mouth daily. 12/21/13   Quintella Reichert, MD  simvastatin (ZOCOR) 10 MG tablet Take 10 mg by mouth every evening.     Historical Provider, MD  spironolactone (ALDACTONE) 25 MG tablet take 1/2 tablet by mouth once daily 10/02/13   Quintella Reichert, MD  torsemide (DEMADEX) 20 MG tablet Take 20 mg by mouth as needed. As needed for weight 215 or greater. Take 20 mg    Historical  Provider, MD  warfarin (COUMADIN) 5 MG tablet Take1 and 1/2 tablet tonight then Take one tab (5 mg) on Tues & Thurs & 1/2 tab (2.5mg ) on all other days 01/27/13   Quintella Reichert, MD   BP 111/75  Pulse 64  Temp(Src) 97.9 F (36.6 C) (Oral)  Resp 16  Ht 6\' 3"  (1.905 m)  SpO2 99% Physical Exam  Nursing note and vitals reviewed. Constitutional: He is oriented to person, place, and time. He appears well-developed and well-nourished. No distress.  HENT:  Head: Normocephalic.  Small abrasion to left upper lip. 2 cm abrasion to the left temporal scalp   Eyes: Conjunctivae are normal. Pupils are equal, round, and reactive to light. No scleral icterus.  Neck: Normal range of motion. Neck supple. No thyromegaly present.  Cardiovascular: Normal rate and regular rhythm.  Exam reveals no gallop and no friction rub.   No murmur heard. Pulmonary/Chest: Effort normal and breath sounds normal. No respiratory distress. He has no wheezes. He has no rales.  Abdominal: Soft. Bowel sounds are normal. He exhibits no distension. There is no tenderness. There is no rebound.  Musculoskeletal: Normal range of motion.  Neurological: He is alert and oriented to person, place, and time.  Skin: Skin is warm and dry. No rash noted.  Psychiatric: He has a normal mood and affect. His behavior is normal.    ED Course  Procedures (including critical care time)  DIAGNOSTIC STUDIES: Oxygen Saturation is 99% on RA, normal by my interpretation.    COORDINATION OF CARE: 7:24 PM Discussed treatment plan with pt at bedside and pt agreed to plan.   Labs Review Labs Reviewed  CBC WITH DIFFERENTIAL - Abnormal; Notable for the following:    Neutrophils Relative % 82 (*)    Lymphocytes Relative 9 (*)    All other components within normal limits  BASIC METABOLIC PANEL - Abnormal; Notable for the following:    Sodium 135 (*)    Glucose, Bld 162 (*)    Creatinine, Ser 1.60 (*)    GFR calc non Af Amer 39 (*)    GFR calc Af  Amer 46 (*)    All other components within normal limits  PROTIME-INR - Abnormal; Notable for the following:    Prothrombin Time 29.6 (*)    INR 2.81 (*)    All other components within normal limits    Imaging Review Ct Head Wo Contrast  12/23/2013   CLINICAL DATA:  Head trauma with low left-sided laceration  EXAM: CT HEAD WITHOUT CONTRAST  TECHNIQUE: Contiguous axial images were obtained from the base of the skull through the vertex without intravenous contrast.  COMPARISON:  None.  FINDINGS: There is mild age appropriate diffuse cerebral and cerebellar atrophy with compensatory ventriculomegaly. There is no acute intracranial hemorrhage nor evidence of an evolving ischemic infarction. There is deep white matter hypodensity bilaterally consistent with chronic small vessel ischemia.  The observed portions of the paranasal sinuses and mastoid air cells are clear. There is no acute skull fracture. There is no significant cephalohematoma.  IMPRESSION: 1. There is no acute intracranial hemorrhage nor other acute intracranial abnormality. 2. There are age related changes consistent with chronic small vessel ischemia.   Electronically Signed   By: David  [place]   On: 12/23/2013 20:34     EKG Interpretation None      MDM   Final diagnoses:  Abrasions of multiple sites      I personally performed the services described in this documentation, which was scribed in my presence. The recorded information has been reviewed and is accurate.   Rolland PorterMark Karrine Kluttz, MD 12/23/13 2125

## 2013-12-23 NOTE — Discharge Instructions (Signed)
Talk to your primary care physician about physical therapy regarding your balance issues.  Abrasion An abrasion is a cut or scrape of the skin. Abrasions do not extend through all layers of the skin and most heal within 10 days. It is important to care for your abrasion properly to prevent infection. CAUSES  Most abrasions are caused by falling on, or gliding across, the ground or other surface. When your skin rubs on something, the outer and inner layer of skin rubs off, causing an abrasion. DIAGNOSIS  Your caregiver will be able to diagnose an abrasion during a physical exam.  TREATMENT  Your treatment depends on how large and deep the abrasion is. Generally, your abrasion will be cleaned with water and a mild soap to remove any dirt or debris. An antibiotic ointment may be put over the abrasion to prevent an infection. A bandage (dressing) may be wrapped around the abrasion to keep it from getting dirty.  You may need a tetanus shot if:  You cannot remember when you had your last tetanus shot.  You have never had a tetanus shot.  The injury broke your skin. If you get a tetanus shot, your arm may swell, get red, and feel warm to the touch. This is common and not a problem. If you need a tetanus shot and you choose not to have one, there is a rare chance of getting tetanus. Sickness from tetanus can be serious.  HOME CARE INSTRUCTIONS   If a dressing was applied, change it at least once a day or as directed by your caregiver. If the bandage sticks, soak it off with warm water.   Wash the area with water and a mild soap to remove all the ointment 2 times a day. Rinse off the soap and pat the area dry with a clean towel.   Reapply any ointment as directed by your caregiver. This will help prevent infection and keep the bandage from sticking. Use gauze over the wound and under the dressing to help keep the bandage from sticking.   Change your dressing right away if it becomes wet or  dirty.   Only take over-the-counter or prescription medicines for pain, discomfort, or fever as directed by your caregiver.   Follow up with your caregiver within 24-48 hours for a wound check, or as directed. If you were not given a wound-check appointment, look closely at your abrasion for redness, swelling, or pus. These are signs of infection. SEEK IMMEDIATE MEDICAL CARE IF:   You have increasing pain in the wound.   You have redness, swelling, or tenderness around the wound.   You have pus coming from the wound.   You have a fever or persistent symptoms for more than 2-3 days.  You have a fever and your symptoms suddenly get worse.  You have a bad smell coming from the wound or dressing.  MAKE SURE YOU:   Understand these instructions.  Will watch your condition.  Will get help right away if you are not doing well or get worse. Document Released: 03/21/2005 Document Revised: 05/28/2012 Document Reviewed: 05/15/2011 [facility] Patient Information 2015 [place], [place]. This information is not intended to replace advice given to you by your health care provider. Make sure you discuss any questions you have with your health care provider.

## 2013-12-23 NOTE — ED Notes (Signed)
Small abrasions to face

## 2013-12-23 NOTE — Telephone Encounter (Signed)
Pt fell and hit his head, EMS checked him- he does have a laceration on his head, but pt told them he was fine and went home.  Son is calling now.  I recommended he go to ER and be evaluated, danger of bleed in head could cause death.  They sent in transmission of BiV ICD.  I will check with [employer].

## 2013-12-24 ENCOUNTER — Other Ambulatory Visit: Payer: Medicare Other

## 2013-12-24 ENCOUNTER — Telehealth: Payer: Self-pay | Admitting: Cardiology

## 2013-12-24 NOTE — Telephone Encounter (Signed)
Pt went to ER will send message to device clinic to look at download from last night.

## 2013-12-28 ENCOUNTER — Ambulatory Visit (INDEPENDENT_AMBULATORY_CARE_PROVIDER_SITE_OTHER): Payer: Medicare Other | Admitting: *Deleted

## 2013-12-28 DIAGNOSIS — E785 Hyperlipidemia, unspecified: Secondary | ICD-10-CM

## 2013-12-28 LAB — ALT: ALT: 27 U/L (ref 0–53)

## 2013-12-30 LAB — NMR LIPOPROFILE WITH LIPIDS
Cholesterol, Total: 110 mg/dL (ref <200)
HDL Particle Number: 21.3 umol/L — ABNORMAL LOW (ref >=30.5)
HDL SIZE: 9.5 nm (ref >=9.2)
HDL-C: 39 mg/dL — ABNORMAL LOW (ref >=40)
LDL (calc): 61 mg/dL (ref <100)
LDL PARTICLE NUMBER: 659 nmol/L (ref <1000)
LDL Size: 21.2 nm (ref >20.5)
LP-IR Score: 25 (ref <=45)
Large HDL-P: 5.3 umol/L (ref >=4.8)
SMALL LDL PARTICLE NUMBER: 424 nmol/L (ref <=527)
Triglycerides: 50 mg/dL (ref <150)
VLDL Size: 42.1 nm (ref <=46.6)

## 2013-12-31 ENCOUNTER — Other Ambulatory Visit: Payer: Self-pay | Admitting: General Surgery

## 2013-12-31 DIAGNOSIS — E785 Hyperlipidemia, unspecified: Secondary | ICD-10-CM

## 2014-01-08 ENCOUNTER — Ambulatory Visit: Payer: Medicare Other | Admitting: Cardiology

## 2014-01-11 ENCOUNTER — Telehealth: Payer: Self-pay | Admitting: *Deleted

## 2014-01-11 NOTE — Telephone Encounter (Signed)
Spoke with pt's PCP (establishing with this practice) and requested they obtain orthostatic VS when he sees them on Wednesday. This request is secondary to recent syncopal episode. They will obtain orthostatics and fax results to [place].

## 2014-01-13 ENCOUNTER — Ambulatory Visit (INDEPENDENT_AMBULATORY_CARE_PROVIDER_SITE_OTHER): Payer: Medicare Other | Admitting: Family Medicine

## 2014-01-13 ENCOUNTER — Encounter: Payer: Self-pay | Admitting: Family Medicine

## 2014-01-13 VITALS — BP 117/72 | HR 71 | Ht 74.0 in | Wt 216.0 lb

## 2014-01-13 DIAGNOSIS — N400 Enlarged prostate without lower urinary tract symptoms: Secondary | ICD-10-CM | POA: Insufficient documentation

## 2014-01-13 DIAGNOSIS — I482 Chronic atrial fibrillation, unspecified: Secondary | ICD-10-CM

## 2014-01-13 DIAGNOSIS — I4891 Unspecified atrial fibrillation: Secondary | ICD-10-CM

## 2014-01-13 DIAGNOSIS — N183 Chronic kidney disease, stage 3 unspecified: Secondary | ICD-10-CM

## 2014-01-13 DIAGNOSIS — H6123 Impacted cerumen, bilateral: Secondary | ICD-10-CM

## 2014-01-13 DIAGNOSIS — I129 Hypertensive chronic kidney disease with stage 1 through stage 4 chronic kidney disease, or unspecified chronic kidney disease: Secondary | ICD-10-CM | POA: Insufficient documentation

## 2014-01-13 DIAGNOSIS — E039 Hypothyroidism, unspecified: Secondary | ICD-10-CM

## 2014-01-13 DIAGNOSIS — H6120 Impacted cerumen, unspecified ear: Secondary | ICD-10-CM

## 2014-01-13 DIAGNOSIS — I5022 Chronic systolic (congestive) heart failure: Secondary | ICD-10-CM

## 2014-01-13 DIAGNOSIS — Z95810 Presence of automatic (implantable) cardiac defibrillator: Secondary | ICD-10-CM

## 2014-01-13 LAB — POCT INR: INR: 2.6

## 2014-01-13 NOTE — Progress Notes (Signed)
CC: [name] [name] is a 80 y.o. male is here for Establish Care   Subjective: HPI:  Very pleasant 80 year old here to establish care  Patient reports a history of atrial fibrillation. Over the past week he's been taking 5 mg of Coumadin on a daily basis with the exception of only 2.5 mg on Tuesdays. 2 weeks ago he had an INR drawn at a local emergency room which was 2.8. He denies bleeding bruising or any recent motor or sensory disturbances. He is on amiodarone and carvedilol in addition to digoxin. Denies rapid heartbeat or sensation of irregular heartbeat.  History of hypothyroidism: Currently taking levothyroxine 125 mcg on a daily basis without missed doses. He believes she's been on this dose for years. It appears the last time TSH was checked was over a year ago. Denies unintentional weight gain or loss provided he keeps a constant 1000 mg sodium restriction a day.  Chart review reveals stage III chronic renal insufficiency.  Reports a history of chronic systolic heart failure. Last echocardiogram that I can see was back in August 2014 showing ejection fraction of 25-30%. He is followup with his cardiologist in September and we both anticipate that he might have a repeat echocardiogram. He is on a beta blocker, he takes an ACE inhibitor at a very low dose and takes prolactin daily. He has torsemide at home however has only had to use it once in the last 30 days due to increasing weight overnight. Denies orthopnea, peripheral edema, nor PED. He takes meticulous notes that his weights over the past month only once approaching 215 typically it stays around 209-210  Complains of decreased hearing in the right ear that has been present and worsening are weekly basis for the past month. Symptoms are similar to waxy buildup in the past which required cerumen impaction removal. Describes itching in the right ear no tinnitus or pain or discharge.  Review of Systems - General ROS: negative for -  chills, fever, night sweats, weight gain or weight loss Ophthalmic ROS: negative for - decreased vision Psychological ROS: negative for - anxiety or depression ENT ROS: negative for -  nasal congestion, tinnitus or allergies Hematological and Lymphatic ROS: negative for - bleeding problems, bruising or swollen lymph nodes Breast ROS: negative Respiratory ROS: no cough, shortness of breath, or wheezing Cardiovascular ROS: no chest pain or dyspnea on exertion Gastrointestinal ROS: no abdominal pain, change in bowel habits, or black or bloody stools Genito-Urinary ROS: negative for - genital discharge, genital ulcers, incontinence or abnormal bleeding from genitals Musculoskeletal ROS: negative for - joint pain or muscle pain Neurological ROS: negative for - headaches or memory loss Dermatological ROS: negative for lumps, mole changes, rash and skin lesion changes  Past Medical History  Diagnosis Date  . Nonischemic cardiomyopathy     ef 10 %  . BPH (benign prostatic hypertrophy)   . Hypothyroidism   . Left bundle branch block   . Atrial fibrillation     fibrillation/flutter  . Depression   . Sleep apnea     on CPAP.    Marland Kitchen. Hyperlipidemia   . Thrombocytopenia   . Osteoarthritis   . Acute on chronic systolic heart failure   . Anemia, unspecified 04/07/2013  . Hypertension   . CHF (congestive heart failure)     Nonischemic DCM EF 10% s/p BiV AICD  . CHF (congestive heart failure)     Chronic systolic CHF    Past Surgical History  Procedure Laterality Date  .  Transurethral resection of prostate      for BPH  . Cholecystectomy    . Colonscopy      reportedly negative per pt; around 2005.    . Cardioversion N/A 12/29/2012    Procedure: CARDIOVERSION;  Surgeon: Quintella Reichert, MD;  Location: [facility];  Service: Cardiovascular;  Laterality: N/A;   Family History  Problem Relation Age of Onset  . Heart failure Mother   . Heart disease Father   . Cancer Maternal Uncle      Unknown subtype    History   Social History  . Marital Status: Married    Spouse Name: N/A    Number of Children: 4  . Years of Education: N/A   Occupational History  .      retired Optometrist; now in [employer], political party   Social History Main Topics  . Smoking status: Never Smoker   . Smokeless tobacco: Not on file  . Alcohol Use: No     Comment: occasionally  . Drug Use: No  . Sexual Activity: No   Other Topics Concern  . Not on file   Social History Narrative  . No narrative on file     Objective: BP 117/72  Pulse 71  Ht 6\' 2"  (1.88 m)  Wt 216 lb (97.977 kg)  BMI 27.72 kg/m2  General: Alert and Oriented, No Acute Distress HEENT: Pupils equal, round, reactive to light. Conjunctivae clear.  External ears unremarkable, cerumen impaction is present bilaterally with decreased hearing in the right ear. Lavage and curettage was performed however due to pain this was aborted before successful completion. Pink inferior turbinates.  Moist mucous membranes, pharynx without inflammation nor lesions.  Neck supple without palpable lymphadenopathy nor abnormal masses. Lungs: Clear to auscultation bilaterally, no wheezing/ronchi/rales.  Comfortable work of breathing. Good air movement. Cardiac: Regular rate and rhythm. Normal S1/S2.  No murmurs, rubs, nor gallops.   Extremities: No peripheral edema.  Strong peripheral pulses.  Mental Status: No depression, anxiety, nor agitation. Skin: Warm and dry.  Assessment & Plan: [name] was seen today for establish care.  Diagnoses and associated orders for this visit:  Chronic atrial fibrillation - POCT INR  HYPOTHYROIDISM - TSH  Chronic renal insufficiency, stage III (moderate)  Biventricular implantable cardioverter-defibrillator-[employer]  Cerumen impaction, bilateral  Chronic systolic heart failure    Chronic atrial fibrillation: Currently rate controlled appears to be in a normal rhythm, INR of 2.6 therapeutic  no change to Coumadin regimen repeat in one month Hypothyroidism: Clinically controlled overdue for TSH continue levothyroxine pending results Chronic renal insufficiency: Discussed avoiding nonsteroidal anti-inflammatories for any pain that may occur now when I see him next Bilateral cerumen impaction: Unsuccessful removal today I offered him a ENT referral however he politely declines. We discussed using hydrogen peroxide gently applied to the external canals are daily basis for the next week to see if this helps dissolve the wax Chronic systolic heart failure: Controlled no change in medications today     Return in about 4 weeks (around 02/10/2014) for INR Check.

## 2014-01-14 LAB — TSH: TSH: 3.508 u[IU]/mL (ref 0.350–4.500)

## 2014-01-15 ENCOUNTER — Ambulatory Visit: Payer: Medicare Other | Admitting: Cardiology

## 2014-01-25 ENCOUNTER — Encounter: Payer: Self-pay | Admitting: Family Medicine

## 2014-01-26 ENCOUNTER — Telehealth: Payer: Self-pay | Admitting: Family Medicine

## 2014-01-26 NOTE — Telephone Encounter (Signed)
Mr. Choudry called. He wants refill on Warfarin/he is now using [facility] on [address] in [place]. Thank you.

## 2014-01-27 MED ORDER — WARFARIN SODIUM 5 MG PO TABS: 5.0000 mg | ORAL_TABLET | Freq: Every day | ORAL | Status: DC

## 2014-02-03 ENCOUNTER — Ambulatory Visit (INDEPENDENT_AMBULATORY_CARE_PROVIDER_SITE_OTHER): Payer: Medicare Other | Admitting: Family Medicine

## 2014-02-03 DIAGNOSIS — I4891 Unspecified atrial fibrillation: Secondary | ICD-10-CM

## 2014-02-03 LAB — POCT INR: INR: 2.2

## 2014-02-03 NOTE — Progress Notes (Signed)
[name] [name], Will you please relay that INR was 2.2 and at goal.  No change to coumadin regimen but f/u in 1 month for recheck.

## 2014-02-03 NOTE — Progress Notes (Signed)
Pt.notified

## 2014-02-08 ENCOUNTER — Ambulatory Visit (HOSPITAL_COMMUNITY)
Admission: RE | Admit: 2014-02-08 | Discharge: 2014-02-08 | Disposition: A | Discharge status: Home / Self Care | Payer: Medicare Other | Source: Ambulatory Visit | Attending: Cardiology | Admitting: Cardiology

## 2014-02-08 ENCOUNTER — Encounter (HOSPITAL_COMMUNITY): Payer: Self-pay

## 2014-02-08 VITALS — BP 110/66 | HR 74 | Resp 18 | Wt 218.0 lb

## 2014-02-08 DIAGNOSIS — I509 Heart failure, unspecified: Secondary | ICD-10-CM | POA: Diagnosis not present

## 2014-02-08 DIAGNOSIS — I2510 Atherosclerotic heart disease of native coronary artery without angina pectoris: Secondary | ICD-10-CM | POA: Insufficient documentation

## 2014-02-08 DIAGNOSIS — M1990 Unspecified osteoarthritis, unspecified site: Secondary | ICD-10-CM | POA: Diagnosis not present

## 2014-02-08 DIAGNOSIS — I447 Left bundle-branch block, unspecified: Secondary | ICD-10-CM | POA: Diagnosis not present

## 2014-02-08 DIAGNOSIS — Z7901 Long term (current) use of anticoagulants: Secondary | ICD-10-CM | POA: Diagnosis not present

## 2014-02-08 DIAGNOSIS — Z95810 Presence of automatic (implantable) cardiac defibrillator: Secondary | ICD-10-CM

## 2014-02-08 DIAGNOSIS — N183 Chronic kidney disease, stage 3 unspecified: Secondary | ICD-10-CM | POA: Insufficient documentation

## 2014-02-08 DIAGNOSIS — N400 Enlarged prostate without lower urinary tract symptoms: Secondary | ICD-10-CM | POA: Insufficient documentation

## 2014-02-08 DIAGNOSIS — D696 Thrombocytopenia, unspecified: Secondary | ICD-10-CM | POA: Insufficient documentation

## 2014-02-08 DIAGNOSIS — F3289 Other specified depressive episodes: Secondary | ICD-10-CM | POA: Diagnosis not present

## 2014-02-08 DIAGNOSIS — I5022 Chronic systolic (congestive) heart failure: Secondary | ICD-10-CM | POA: Insufficient documentation

## 2014-02-08 DIAGNOSIS — I498 Other specified cardiac arrhythmias: Secondary | ICD-10-CM | POA: Insufficient documentation

## 2014-02-08 DIAGNOSIS — G4733 Obstructive sleep apnea (adult) (pediatric): Secondary | ICD-10-CM | POA: Insufficient documentation

## 2014-02-08 DIAGNOSIS — I129 Hypertensive chronic kidney disease with stage 1 through stage 4 chronic kidney disease, or unspecified chronic kidney disease: Secondary | ICD-10-CM | POA: Diagnosis not present

## 2014-02-08 DIAGNOSIS — I480 Paroxysmal atrial fibrillation: Secondary | ICD-10-CM

## 2014-02-08 DIAGNOSIS — E039 Hypothyroidism, unspecified: Secondary | ICD-10-CM | POA: Diagnosis not present

## 2014-02-08 DIAGNOSIS — I4891 Unspecified atrial fibrillation: Secondary | ICD-10-CM | POA: Insufficient documentation

## 2014-02-08 DIAGNOSIS — F329 Major depressive disorder, single episode, unspecified: Secondary | ICD-10-CM | POA: Insufficient documentation

## 2014-02-08 DIAGNOSIS — E785 Hyperlipidemia, unspecified: Secondary | ICD-10-CM | POA: Insufficient documentation

## 2014-02-08 DIAGNOSIS — D649 Anemia, unspecified: Secondary | ICD-10-CM | POA: Insufficient documentation

## 2014-02-08 DIAGNOSIS — I428 Other cardiomyopathies: Secondary | ICD-10-CM | POA: Diagnosis not present

## 2014-02-08 LAB — BASIC METABOLIC PANEL
Anion gap: 12 (ref 5–15)
BUN: 26 mg/dL — ABNORMAL HIGH (ref 6–23)
CO2: 24 meq/L (ref 19–32)
Calcium: 9.3 mg/dL (ref 8.4–10.5)
Chloride: 103 mEq/L (ref 96–112)
Creatinine, Ser: 1.57 mg/dL — ABNORMAL HIGH (ref 0.50–1.35)
GFR calc Af Amer: 47 mL/min — ABNORMAL LOW (ref >90)
GFR, EST NON AFRICAN AMERICAN: 40 mL/min — AB (ref >90)
GLUCOSE: 97 mg/dL (ref 70–99)
POTASSIUM: 5.4 meq/L — AB (ref 3.7–5.3)
Sodium: 139 mEq/L (ref 137–147)

## 2014-02-08 LAB — DIGOXIN LEVEL: DIGOXIN LVL: 0.8 ng/mL (ref 0.8–2.0)

## 2014-02-08 MED ORDER — SIMVASTATIN 10 MG PO TABS: 10.0000 mg | ORAL_TABLET | Freq: Every evening | ORAL | Status: DC

## 2014-02-08 MED ORDER — AMIODARONE HCL 100 MG PO TABS: 100.0000 mg | ORAL_TABLET | Freq: Every day | ORAL | Status: DC

## 2014-02-08 MED ORDER — SPIRONOLACTONE 25 MG PO TABS: 12.5000 mg | ORAL_TABLET | Freq: Every day | ORAL | Status: DC

## 2014-02-08 NOTE — Patient Instructions (Signed)
Doing well.  Try to be as active as possible.  Decrease your Amiodarone to 100 mg daily. You currently have 200 mg tablets, so take 1/2 tablet daily and your new prescription will be 100 mg tablets.  Call any issues.  F/U in 5 months with ECHO.  Do the following things EVERYDAY: 1) Weigh yourself in the morning before breakfast. Write it down and keep it in a log. 2) Take your medicines as prescribed 3) Eat low salt foods-Limit salt (sodium) to 2000 mg per day.  4) Stay as active as you can everyday 5) Limit all fluids for the day to less than 2 liters 6)

## 2014-02-08 NOTE — Progress Notes (Addendum)
Patient ID: Thomasena EdisVernon E Deuser, male   DOB: 05/21/1932, 80 y.o.   MRN: [id number] Primary Cardiologist: Dr Mayford Knifeurner PCP: Dr Twanna HySean Hummel [facility] [address]) Manages Coumadin  Optivol  HPI: Mr Dan HumphreysWalker is a 80 year old with a history of A tach S/P successful DC-CV 12/29/12, CAD, PAF on chronic coumadin and amiodarone, OSA- CPAP, NICM cath 2008, chronic systolic heart failure EF 25-30% (01/2013) , S/P Medtronic CRT-D 2008 and generator changed 2012, LBBB, CRI (baseline 1.7-1.8) and Hypothyroidism.   He is S/P DC-CV 12/29/12 and initially he felt good for about a week. He has had a functional decline and only able to walk a few steps. He presented [facility] ED 01/22/13 with increased dyspnea on exertion and low extremity edema. He was started on a lasix gtt 10 mg hr. Admit weight 233 lbs. Discharge weight: 214 lbs  Follow up for Heart Failure: Since last visit was seen in the ED for syncopal episode. Patient reports that [facility] and when he bent over to get something out of bottom of cart he got weak and couldn't get up and had hit head slightly on cart. No ventricular arrythmia's on ICD. No more episodes like that since. Doing ok overall. Rides a buggy now when he goes to the grocery store. Denies SOB, orthopnea, PND or CP. No BRBPR or melena. Weight at home 209-211 lbs. SBP 100-116. Pulse 70-80s. Has not needed any torsemide. Wears CPAP nightly. Following low salt diet and drinking less than 2L a day. Reports mowed the lawn twice last week.   Optivol: Interrogated, no crossings over threshold and thoracic impedence level up; less than 30 min activity a day. 100% BiV pacing.   Labs 8/21: Na 132 K 4.5 Cr 1.7 (stable)  Labs 03/02/13 NA 135 Potassium 4.6 Creatinine 1.6 Dig 1.6 TSH 3.95        04/02/13: digoxin 0.8, K+ 4.5, creatinine 1.48         09/08/13 K 4.4 Creatine 1.7         12/2013: K 4.4, creatinine 1.6, LDL 61, HDL 39, TG 50, cholesterol 110, ALT 27, TSH 3.5  ROS: All systems  negative except as listed in HPI, PMH and Problem List.  Past Medical History  Diagnosis Date  . Nonischemic cardiomyopathy     ef 10 %  . BPH (benign prostatic hypertrophy)   . Hypothyroidism   . Left bundle branch block   . Atrial fibrillation     fibrillation/flutter  . Depression   . Sleep apnea     on CPAP.    Marland Kitchen. Hyperlipidemia   . Thrombocytopenia   . Osteoarthritis   . Acute on chronic systolic heart failure   . Anemia, unspecified 04/07/2013  . Hypertension   . CHF (congestive heart failure)     Nonischemic DCM EF 10% s/p BiV AICD  . CHF (congestive heart failure)     Chronic systolic CHF    Current Outpatient Prescriptions  Medication Sig Dispense Refill  . amiodarone (PACERONE) 200 MG tablet Take 1 tablet (200 mg total) by mouth daily.  30 tablet  3  . carvedilol (COREG) 3.125 MG tablet Take 1 tablet by mouth 2 (two) times daily.      . digoxin (LANOXIN) 0.125 MG tablet Take 1 tablet (0.125 mg total) by mouth every other day.  30 tablet  11  . levothyroxine (SYNTHROID, LEVOTHROID) 125 MCG tablet Take 125 mcg by mouth daily before breakfast.       .  potassium chloride SA (K-DUR,KLOR-CON) 20 MEQ tablet Take 20 mEq by mouth as needed.       . ramipril (ALTACE) 2.5 MG capsule Take 1 capsule (2.5 mg total) by mouth daily.  90 capsule  0  . simvastatin (ZOCOR) 10 MG tablet Take 10 mg by mouth every evening.       Marland Kitchen spironolactone (ALDACTONE) 25 MG tablet take 1/2 tablet by mouth once daily  15 tablet  3  . torsemide (DEMADEX) 20 MG tablet Take 20 mg by mouth as needed. As needed for weight 215 or greater. Take 20 mg      . warfarin (COUMADIN) 5 MG tablet Take 1 tablet (5 mg total) by mouth daily at 6 PM. Only half a dose (2.5mg ) on Tuesdays  30 tablet  5   No current facility-administered medications for this encounter.    Filed Vitals:   02/08/14 0928  BP: 110/66  Pulse: 74  Resp: 18  Weight: 218 lb (98.884 kg)  SpO2: 96%    PHYSICAL EXAM: General: Elderly  appearing. No resp difficulty  HEENT: normal  Neck: supple. JVP flat. Carotids 2+ bilat; no bruits. No lymphadenopathy or thryomegaly appreciated.  Cor: PMI nondisplaced. Regular rate & rhythm. No rubs, gallops. Soft 2/6 MR  Lungs: clear  Abdomen: soft, nontender, nondistended. No hepatosplenomegaly. No bruits or masses. Good bowel sounds.  Extremities: no cyanosis, clubbing, rash, no edema  Neuro: alert & orientedx3, cranial nerves grossly intact. moves all 4 extremities w/o difficulty. Affect pleasant   ASSESSMENT & PLAN:  1) Chronic systolic HF: NICM s/p CRT-D; EF 25-30% (01/2013) Optivol - NYHA II-III symptoms. Volume status stable will continue torsemide as needed for weight >215 lbs.  - Continue low dose coreg 3.125 mg BID. Will not titrate with fatigue.  - Continue spiro 12.5 mg daily, ramipril 2.5 mg daily and digoxin 0.125 mg. Check digoxin level and BMET today.  - Optivol- No crossings over threshold, no afib. Activity < 30 minutes per day.  - Reinforced the need and importance of daily weights, a low sodium diet, and fluid restriction (less than 2 L a day). Instructed to call the HF clinic if weight increases more than 3 lbs overnight or 5 lbs in a week.  2) OSA-Continue CPAP at night 3) Atrial tach/AF-- No afib on Optivol. Will cut amiodarone back to 100 mg daily - TSH and LFTs checked 12/2013, nl. - recommended go for a yearly eye exam. - In the past offered NOAC however he can not afford. Continue coumadin. This managed by PCP. 4) CKD stage III - Check BMET today. Baseline creatinine 1.4-1.6  F/U 5 months with ECHO Ulla Potash B NP-C 9:45 AM

## 2014-02-10 ENCOUNTER — Telehealth: Payer: Self-pay | Admitting: *Deleted

## 2014-02-10 MED ORDER — TRAMADOL HCL 50 MG PO TABS: 25.0000 mg | ORAL_TABLET | Freq: Three times a day (TID) | ORAL | Status: DC | PRN

## 2014-02-10 NOTE — Telephone Encounter (Signed)
[name] [name] called this morning and he has chronic neck pain which he takes low dose of tramadol. He said that he uses this because of all the heart medications he is on. He asks for a new script to be sent to his pharmacy but I do not see it on his medication list in his chart. Please advise. Corliss Skains, CMA

## 2014-02-10 NOTE — Telephone Encounter (Signed)
Sue Lush, This is acceptable given that we want to avoid NSAIDs.  Rx in you inbox ready for pickup/fax.

## 2014-02-10 NOTE — Telephone Encounter (Signed)
Pt.notified

## 2014-02-16 ENCOUNTER — Telehealth (HOSPITAL_COMMUNITY): Payer: Self-pay

## 2014-02-16 DIAGNOSIS — I5022 Chronic systolic (congestive) heart failure: Secondary | ICD-10-CM

## 2014-02-16 NOTE — Telephone Encounter (Signed)
Lab results reviewed with patient, instructed to stop potassium.  Will recheck BMET at next coumadin clinic appointment.  Aware and agreeable. Ave Filter

## 2014-02-18 ENCOUNTER — Ambulatory Visit (INDEPENDENT_AMBULATORY_CARE_PROVIDER_SITE_OTHER): Payer: Medicare Other | Admitting: Family Medicine

## 2014-02-18 DIAGNOSIS — I4891 Unspecified atrial fibrillation: Secondary | ICD-10-CM

## 2014-02-18 LAB — POCT INR: INR: 2.4

## 2014-02-18 NOTE — Progress Notes (Signed)
Pt.notified

## 2014-02-18 NOTE — Patient Instructions (Signed)
Andrea, Will you please relay that INR was 2.4 and at goal.  No change to coumadin regimen but f/u in 1 month for recheck. 

## 2014-02-18 NOTE — Progress Notes (Signed)
Sue Lush, Will you please relay that INR was 2.4 and at goal.  No change to coumadin regimen but f/u in 1 month for recheck.

## 2014-02-23 ENCOUNTER — Ambulatory Visit (INDEPENDENT_AMBULATORY_CARE_PROVIDER_SITE_OTHER): Payer: Medicare Other | Admitting: *Deleted

## 2014-02-23 DIAGNOSIS — I5022 Chronic systolic (congestive) heart failure: Secondary | ICD-10-CM

## 2014-02-23 DIAGNOSIS — I509 Heart failure, unspecified: Secondary | ICD-10-CM

## 2014-02-23 DIAGNOSIS — I428 Other cardiomyopathies: Secondary | ICD-10-CM

## 2014-02-23 LAB — MDC_IDC_ENUM_SESS_TYPE_REMOTE
Brady Statistic AP VP Percent: 98.89 %
Brady Statistic AP VS Percent: 0.01 %
Brady Statistic AS VP Percent: 1.09 %
Brady Statistic AS VS Percent: 0 %
Brady Statistic RV Percent Paced: 99.98 %
Date Time Interrogation Session: 20150901125350
HIGH POWER IMPEDANCE MEASURED VALUE: 342 Ohm
HighPow Impedance: 171 Ohm
HighPow Impedance: 39 Ohm
HighPow Impedance: 47 Ohm
Lead Channel Impedance Value: 380 Ohm
Lead Channel Impedance Value: 399 Ohm
Lead Channel Impedance Value: 513 Ohm
Lead Channel Impedance Value: 836 Ohm
Lead Channel Pacing Threshold Amplitude: 0.75 V
Lead Channel Pacing Threshold Amplitude: 0.75 V
Lead Channel Pacing Threshold Pulse Width: 0.4 ms
Lead Channel Sensing Intrinsic Amplitude: 1 mV
Lead Channel Sensing Intrinsic Amplitude: 1 mV
Lead Channel Setting Pacing Amplitude: 2 V
Lead Channel Setting Pacing Amplitude: 2.5 V
Lead Channel Setting Pacing Amplitude: 2.5 V
Lead Channel Setting Pacing Pulse Width: 0.4 ms
Lead Channel Setting Pacing Pulse Width: 0.4 ms
Lead Channel Setting Sensing Sensitivity: 0.3 mV
MDC IDC MSMT BATTERY VOLTAGE: 3.01 V
MDC IDC MSMT LEADCHNL LV IMPEDANCE VALUE: 494 Ohm
MDC IDC MSMT LEADCHNL LV PACING THRESHOLD AMPLITUDE: 0.875 V
MDC IDC MSMT LEADCHNL LV PACING THRESHOLD PULSEWIDTH: 0.4 ms
MDC IDC MSMT LEADCHNL RV PACING THRESHOLD PULSEWIDTH: 0.4 ms
MDC IDC MSMT LEADCHNL RV SENSING INTR AMPL: 9.875 mV
MDC IDC MSMT LEADCHNL RV SENSING INTR AMPL: 9.875 mV
MDC IDC SET ZONE DETECTION INTERVAL: 340 ms
MDC IDC SET ZONE DETECTION INTERVAL: 400 ms
MDC IDC STAT BRADY RA PERCENT PACED: 98.91 %
Zone Setting Detection Interval: 300 ms
Zone Setting Detection Interval: 360 ms

## 2014-02-23 NOTE — Progress Notes (Signed)
Remote ICD transmission.   

## 2014-02-24 ENCOUNTER — Encounter: Payer: Self-pay | Admitting: Internal Medicine

## 2014-03-02 ENCOUNTER — Telehealth (HOSPITAL_COMMUNITY): Payer: Self-pay | Admitting: Surgery

## 2014-03-02 DIAGNOSIS — I509 Heart failure, unspecified: Secondary | ICD-10-CM

## 2014-03-02 NOTE — Telephone Encounter (Signed)
Received a call from [name] that he did not get BMET drawn at his coumadin appt.  He has an appt. This Thursday September 10th at  Dr. French Ana Turner's office.  I will enter the lab order to be drawn with this appt.  He is aware and agreeable.

## 2014-03-02 NOTE — Telephone Encounter (Signed)
Entered order for Bmet

## 2014-03-04 ENCOUNTER — Encounter: Payer: Self-pay | Admitting: Cardiology

## 2014-03-04 ENCOUNTER — Ambulatory Visit (INDEPENDENT_AMBULATORY_CARE_PROVIDER_SITE_OTHER): Payer: Medicare Other | Admitting: Cardiology

## 2014-03-04 VITALS — BP 124/72 | HR 74 | Ht 74.0 in | Wt 217.6 lb

## 2014-03-04 DIAGNOSIS — E785 Hyperlipidemia, unspecified: Secondary | ICD-10-CM

## 2014-03-04 DIAGNOSIS — I428 Other cardiomyopathies: Secondary | ICD-10-CM

## 2014-03-04 DIAGNOSIS — I10 Essential (primary) hypertension: Secondary | ICD-10-CM

## 2014-03-04 DIAGNOSIS — G4733 Obstructive sleep apnea (adult) (pediatric): Secondary | ICD-10-CM

## 2014-03-04 DIAGNOSIS — I480 Paroxysmal atrial fibrillation: Secondary | ICD-10-CM

## 2014-03-04 DIAGNOSIS — Z7901 Long term (current) use of anticoagulants: Secondary | ICD-10-CM

## 2014-03-04 DIAGNOSIS — I5022 Chronic systolic (congestive) heart failure: Secondary | ICD-10-CM

## 2014-03-04 DIAGNOSIS — I509 Heart failure, unspecified: Secondary | ICD-10-CM

## 2014-03-04 DIAGNOSIS — I4891 Unspecified atrial fibrillation: Secondary | ICD-10-CM

## 2014-03-04 DIAGNOSIS — I2510 Atherosclerotic heart disease of native coronary artery without angina pectoris: Secondary | ICD-10-CM

## 2014-03-04 LAB — BASIC METABOLIC PANEL
BUN: 19 mg/dL (ref 6–23)
CALCIUM: 8.9 mg/dL (ref 8.4–10.5)
CO2: 27 mEq/L (ref 19–32)
Chloride: 101 mEq/L (ref 96–112)
Creatinine, Ser: 1.5 mg/dL (ref 0.4–1.5)
GFR: 46.46 mL/min — ABNORMAL LOW (ref >60.00)
GLUCOSE: 86 mg/dL (ref 70–99)
POTASSIUM: 4.5 meq/L (ref 3.5–5.1)
SODIUM: 134 meq/L — AB (ref 135–145)

## 2014-03-04 MED ORDER — CARVEDILOL 3.125 MG PO TABS: 3.1250 mg | ORAL_TABLET | Freq: Two times a day (BID) | ORAL | Status: DC

## 2014-03-04 MED ORDER — AMIODARONE HCL 100 MG PO TABS: 100.0000 mg | ORAL_TABLET | Freq: Every day | ORAL | Status: DC

## 2014-03-04 NOTE — Progress Notes (Signed)
[address] [place], [place]  [address] Phone: [phone] Fax:  [phone]  Date:  03/04/2014   ID:  [name] [name], DOB 03-Sep-1931, MRN [id number]  PCP:  Laren Boom, DO  Cardiologist:  Armanda Magic, MD     History of Present Illness: [name] [name] is a 80 y.o. male with a history of NIDCM, nonobstructive ASCAD, chronic CHF, HTN, PAF, OSA and dyslipidemia who presents for followup. He is followed in [facility] for NYHA class II-III CHF. He is doing much better. He is following a 1gm sodium diet. His weight has been maintaining around 209lbs. He denies any SOB, LE edema, palpitations or syncope. He occasionally will have some dizziness when going from sitting to standing.He is tolerating his CPAP device and feels the pressure is adequate. He has some problems with the mask with leaking in the middle of the night. He is going to get a new mask.     Wt Readings from Last 3 Encounters:  03/04/14 217 lb 9.6 oz (98.703 kg)  02/08/14 218 lb (98.884 kg)  02/03/14 216 lb (97.977 kg)     Past Medical History  Diagnosis Date  . Nonischemic cardiomyopathy     ef 10 %  . BPH (benign prostatic hypertrophy)   . Hypothyroidism   . Left bundle branch block   . Atrial fibrillation     fibrillation/flutter  . Depression   . Sleep apnea     on CPAP.    Marland Kitchen Hyperlipidemia   . Thrombocytopenia   . Osteoarthritis   . Acute on chronic systolic heart failure   . Anemia, unspecified 04/07/2013  . Hypertension   . CHF (congestive heart failure)     Nonischemic DCM EF 10% s/p BiV AICD  . CHF (congestive heart failure)     Chronic systolic CHF  . Coronary artery disease 2008    nonobstructive ASCAD    Current Outpatient Prescriptions  Medication Sig Dispense Refill  . amiodarone (PACERONE) 100 MG tablet Take 1 tablet (100 mg total) by mouth daily.  30 tablet  3  . carvedilol (COREG) 3.125 MG tablet Take 1 tablet by mouth 2 (two) times daily.      . digoxin  (LANOXIN) 0.125 MG tablet Take 1 tablet (0.125 mg total) by mouth every other day.  30 tablet  11  . levothyroxine (SYNTHROID, LEVOTHROID) 125 MCG tablet Take 125 mcg by mouth daily before breakfast.       . ramipril (ALTACE) 2.5 MG capsule Take 1 capsule (2.5 mg total) by mouth daily.  90 capsule  0  . simvastatin (ZOCOR) 10 MG tablet Take 1 tablet (10 mg total) by mouth every evening.  90 tablet  6  . spironolactone (ALDACTONE) 25 MG tablet Take 0.5 tablets (12.5 mg total) by mouth daily.  45 tablet  6  . torsemide (DEMADEX) 20 MG tablet Take 20 mg by mouth as needed. As needed for weight 215 or greater. Take 20 mg      . traMADol (ULTRAM) 50 MG tablet Take 0.5-1 tablets (25-50 mg total) by mouth every 8 (eight) hours as needed (neck pain).  30 tablet  0  . warfarin (COUMADIN) 5 MG tablet Take 1 tablet (5 mg total) by mouth daily at 6 PM. Only half a dose (2.5mg ) on Tuesdays  30 tablet  5   No current facility-administered medications for this visit.    Allergies:   No Known Allergies  Social History:  The  patient  reports that he has never smoked. He does not have any smokeless tobacco history on file. He reports that he does not drink alcohol or use illicit drugs.   Family History:  The patient's family history includes Cancer in his maternal uncle; Heart disease in his father; Heart failure in his mother.   ROS:  Please see the history of present illness.      All other systems reviewed and negative.   PHYSICAL EXAM: VS:  BP 124/72  Pulse 74  Ht  (1.88 m)  Wt 217 lb 9.6 oz (98.703 kg)  BMI 27.93 kg/m2 Well nourished, well developed, in no acute distress HEENT: normal Neck: no JVD Cardiac:  normal S1, S2; RRR; no murmur Lungs:  clear to auscultation bilaterally, no wheezing, rhonchi or rales Abd: soft, nontender, no hepatomegaly Ext: no edema Skin: warm and dry Neuro:  CNs 2-12 intact, no focal abnormalities noted   ASSESSMENT AND PLAN:  1. PAF maintaining NSR on  Amiodarone  - continue Amio/Coreg/Warfarin  - he is up to date on blood work  - set up for PFT's for monitoring for Amio  2. Chronic systolic CHF - compensated with stable weight - he will continue to monitor his weights and sodium intake. His weight is maintaining around 210-212lbs with no clothes on. His weight today was 211 at home. - continue Torsemide on as needed basis if weight >215lbs  - continue every other day digoxin  - continue Aldactone/ACE I/Coreg  - check  BMET  3. Dyslipidemia - at goal with LDL 61 - continue simvastatin  4. HTN controlled  - continue Coreg/Lisinopril 5. OSA he stopped using his CPAP - he was not sleeping well with it 6.  Nonobstructive ASCAD with no angina    Followup with me in 6 months  Signed, Armanda Magic, MD 03/04/2014 11:14 AM

## 2014-03-04 NOTE — Patient Instructions (Signed)
Your physician recommends that you continue on your current medications as directed. Please refer to the Current Medication list given to you today.  Your physician recommends that you go to the lab today for a BMET  Your physician has recommended that you have a pulmonary function test. Pulmonary Function Tests are a group of tests that measure how well air moves in and out of your lungs.  Pulmonary Function Tests Pulmonary function tests (PFTs) measure how well your lungs are working. The tests can help to identify the causes of lung problems. They can also help your health care provider select the best treatment for you. Your health care provider may order pulmonary function for any of the following reasons:  When an illness involving the lungs is suspected.  To follow changes in your lung function over time if you are known to have a chronic lung disease.  For industrial plant workers to examine the effects of being exposed to chemicals over a long period of time.  To assess lung function prior to surgery or other procedures.  For people who are smokers. Your measured lung function will be compared to the expected lung function of someone with healthy lungs who is similar to you in age, gender, size, and other factors. This is used to determine your "percent predicted" lung function, which is how your health care provider knows if your lung function is normal or abnormal. If you have had prior pulmonary function testing performed, your health care provider will also compare your current results with past tests to see if your lung function is better, worse, or staying the same. This can sometimes be useful to see if treatments are working.  LET [facility] CARE PROVIDER KNOW ABOUT:  Any allergies you have.  All medicines you are taking, including inhaler or nebulizer medicines, vitamins, herbs, eye drops, creams, and over-the-counter medicines.  Any blood disorders you have.  Previous  surgeries you have had, especially recent eye surgery, abdominal surgery, or chest surgery. These can make performing pulmonary function tests difficult or unsafe.  Medical conditions you have.  Chest pain or heart problems.  Tuberculosis or respiratory infections, such as pneumonia, a cold, or the flu. If you think you will have difficulty performing any of the breathing maneuvers, ask your health care provider if you should reschedule the test. RISKS AND COMPLICATIONS: Generally, pulmonary function testing is a safe procedure. However, as with any procedure, complications can occur. Possible complications include:  Lightheadedness due to overbreathing (hyperventilation).  An asthmatic attack from deep breathing. BEFORE THE PROCEDURE  Take medicine as directed by your health care provider. If you take inhaler or nebulizer medicines, ask your health care provider which medicines you should take on the day of your test. Some inhaler medicines may interfere with pulmonary function tests, such as bronchodilator testing, if taken shortly before the test.  Avoid eating a large meal before your test.  Do not smoke before your test.  Wear comfortable clothing which will not interfere with breathing. PROCEDURE  You will be given a soft nose clip to wear during the procedure. This is done so that all of your breaths will go through your mouth instead of your nose.  You will be given a germ-free (sterile) mouthpiece. It will be attached to a spirometer. The spirometer is the machine that measures your breathing.  You will be instructed to perform various breathing maneuvers. The maneuvers will be done by breathing in (inhaling) and breathing out (exhaling). Depending  on what measurements are ordered, you may be asked to repeat the maneuvers several times before the test is completed.  It is important to follow the instructions exactly to obtain accurate results. Make sure to blow as hard and as  fast as you can when you are instructed to do so.  You may be given a bronchodilator after testing has been performed. A bronchodilator is a medicine which makes the small air passages in your lungs larger. These medicines usually make it easier to breathe. The tests are then repeated several minutes later after the bronchodilator has taken effect.  You will be monitored carefully during the procedure for faintness, dizziness, difficulty breathing, or any other problems. AFTER THE PROCEDURE   You may resume your usual diet, medicines, and activities as directed by your health care provider.  Your health care provider will go over your test results with you and determine what treatments may be helpful. Document Released: 02/02/2004 Document Revised: 04/01/2013 Document Reviewed: 01/08/2013 [facility] Patient Information 2015 [place], [place]. This information is not intended to replace advice given to you by your health care provider. Make sure you discuss any questions you have with your health care provider.   Your physician wants you to follow-up in: 6 months with Dr Sherlyn Lick will receive a reminder letter in the mail two months in advance. If you don't receive a letter, please call our office to schedule the follow-up appointment.

## 2014-03-05 ENCOUNTER — Other Ambulatory Visit: Payer: Self-pay

## 2014-03-05 MED ORDER — LEVOTHYROXINE SODIUM 125 MCG PO TABS: 125.0000 ug | ORAL_TABLET | Freq: Every day | ORAL | Status: DC

## 2014-03-05 NOTE — Telephone Encounter (Signed)
Uyless would like a refill on Levothyroxine 125 mcg, 90 day sent to [facility]. This is a historical medication. Thank you.

## 2014-03-08 ENCOUNTER — Telehealth: Payer: Self-pay | Admitting: Cardiology

## 2014-03-08 NOTE — Telephone Encounter (Signed)
Did pts PA this morning. It was Approved this afternoon. Called Pharmacy to make aware. Will call pt after to make aware it was approved and ok to pick up meds.

## 2014-03-08 NOTE — Telephone Encounter (Signed)
New message      Talk to [facility] regarding amiodarone.  Insurance denied it----how important is it that he takes this medication?

## 2014-03-08 NOTE — Telephone Encounter (Signed)
Any suggestion on where he can amio for cheaper price?

## 2014-03-08 NOTE — Telephone Encounter (Signed)
Pt is aware.  After being Prior Approved it was still 82 dollars a month. Pt stated he can not afford this. To Dr Mayford Knife to advise on another medication for pt.

## 2014-03-09 MED ORDER — AMIODARONE HCL 200 MG PO TABS: 100.0000 mg | ORAL_TABLET | Freq: Every day | ORAL | Status: DC

## 2014-03-09 NOTE — Telephone Encounter (Signed)
agree

## 2014-03-09 NOTE — Telephone Encounter (Signed)
Try doing amiodarone 200 mg pill (this strength is generic) and taking 1/2 tablet daily as I believe he is on 100 mg daily.

## 2014-03-09 NOTE — Telephone Encounter (Signed)
That worked. Copay is now only three dollars monthly.

## 2014-03-09 NOTE — Telephone Encounter (Signed)
Pt is aware and he can go pick up from pharmacy.

## 2014-03-10 ENCOUNTER — Ambulatory Visit (INDEPENDENT_AMBULATORY_CARE_PROVIDER_SITE_OTHER): Payer: Medicare Other | Admitting: Family Medicine

## 2014-03-10 VITALS — BP 99/60 | HR 73 | Resp 16 | Wt 215.0 lb

## 2014-03-10 DIAGNOSIS — I480 Paroxysmal atrial fibrillation: Secondary | ICD-10-CM

## 2014-03-10 DIAGNOSIS — I4891 Unspecified atrial fibrillation: Secondary | ICD-10-CM

## 2014-03-10 LAB — POCT INR: INR: 3

## 2014-03-10 NOTE — Progress Notes (Signed)
Pt notified and voiced understanding 

## 2014-03-10 NOTE — Patient Instructions (Signed)
Will you please relay that INR was 3.0 and at goal but given that he needs his teeth extracted and an infection drained I'd recommend decreasing his warfarin regimen to 5mg  MWF and 2.5mg  SSuTTh, alternating daily with a recheck on Monday.  INR needs to be below 2.5 before extraction.  Dr. Jarvis Newcomer and Parker's form will be kept in my inbox by Andrea's desk.

## 2014-03-15 ENCOUNTER — Ambulatory Visit (INDEPENDENT_AMBULATORY_CARE_PROVIDER_SITE_OTHER): Payer: Medicare Other | Admitting: Family Medicine

## 2014-03-15 ENCOUNTER — Telehealth: Payer: Self-pay | Admitting: *Deleted

## 2014-03-15 ENCOUNTER — Encounter: Payer: Self-pay | Admitting: Family Medicine

## 2014-03-15 VITALS — BP 107/70 | HR 71 | Wt 218.0 lb

## 2014-03-15 DIAGNOSIS — I480 Paroxysmal atrial fibrillation: Secondary | ICD-10-CM

## 2014-03-15 DIAGNOSIS — I4891 Unspecified atrial fibrillation: Secondary | ICD-10-CM

## 2014-03-15 LAB — POCT INR: INR: 1.5

## 2014-03-15 NOTE — Progress Notes (Signed)
CC: [name] [name] is a 80 y.o. male is here for INR recheck   Subjective: HPI:  Presents to recheck INR for an upcoming tooth extraction x2. His oral surgeon has been waiting until his INR is less than 2.5 before she will consider surgery for a dental infection and abscess. The patient is currently taking amoxicillin and states that his dental pain has fully resolved.  He denies any new motor or sensory disturbances bleeding issues or swelling of the appendages.   Review Of Systems Outlined In HPI  Past Medical History  Diagnosis Date  . Nonischemic cardiomyopathy     ef 10 %  . BPH (benign prostatic hypertrophy)   . Hypothyroidism   . Left bundle branch block   . Atrial fibrillation     fibrillation/flutter  . Depression   . Sleep apnea     on CPAP.    Marland Kitchen Hyperlipidemia   . Thrombocytopenia   . Osteoarthritis   . Acute on chronic systolic heart failure   . Anemia, unspecified 04/07/2013  . Hypertension   . CHF (congestive heart failure)     Nonischemic DCM EF 10% s/p BiV AICD  . CHF (congestive heart failure)     Chronic systolic CHF  . Coronary artery disease 2008    nonobstructive ASCAD    Past Surgical History  Procedure Laterality Date  . Transurethral resection of prostate      for BPH  . Cholecystectomy    . Colonscopy      reportedly negative per pt; around 2005.    . Cardioversion N/A 12/29/2012    Procedure: CARDIOVERSION;  Surgeon: Quintella Reichert, MD;  Location: [facility] END[facility]COPY;  Service: Cardiovascular;  Laterality: N/A;   Family History  Problem Relation Age of Onset  . Heart failure Mother   . Heart disease Father   . Cancer Maternal Uncle     Unknown subtype    History   Social History  . Marital Status: Married    Spouse Name: N/A    Number of Children: 4  . Years of Education: N/A   Occupational History  .      retired Optometrist; now in Research officer, political party   Social History Main Topics  . Smoking status: Never Smoker   .  Smokeless tobacco: Not on file  . Alcohol Use: No     Comment: occasionally  . Drug Use: No  . Sexual Activity: No   Other Topics Concern  . Not on file   Social History Narrative  . No narrative on file     Objective: BP 107/70  Pulse 71  Wt 218 lb (98.884 kg)  General: Alert and Oriented, No Acute Distress HEENT: Pupils equal, round, reactive to light. Conjunctivae clear.  Moist membranes pharynx unremarkable Lungs: Clear to auscultation bilaterally, no wheezing/ronchi/rales.  Comfortable work of breathing. Good air movement. Cardiac: Regular rate and rhythm. Normal S1/S2.  No murmurs, rubs, nor gallops.   Extremities: No peripheral edema.  Strong peripheral pulses.  Mental Status: No depression, anxiety, nor agitation. Skin: Warm and dry.  Assessment & Plan: [name] [name] was seen today for inr recheck.  Diagnoses and associated orders for this visit:  Paroxysmal atrial fibrillation - POCT INR    His INR today is now down to 1.5 which is below the 2.5 threshold requested by his oral surgeon. I've advised the patient I'm sending over paperwork reflecting this and that I would recommend stopping Coumadin entirely 5 days prior to surgery  and then restarted the day after surgery provided all bleeding is controlled and his surgeon is in agreement. It would be best if he could return 1 week after restarting Coumadin to recheck INR. For now Continue current Coumadin regimen of 5mg  MWF and 2.5mg  SSuTTh until the five-day preop period is reached. I've asked him to call his oral surgeon to help get him on the surgery schedule as soon as possible   Return if symptoms worsen or fail to improve.

## 2014-03-15 NOTE — Telephone Encounter (Signed)
Pt notified to return in 1 week after restarting coumadin to recheck inr

## 2014-03-17 ENCOUNTER — Telehealth: Payer: Self-pay

## 2014-03-17 NOTE — Telephone Encounter (Signed)
Pt.notified

## 2014-03-17 NOTE — Telephone Encounter (Signed)
Sue Lush or Marylene Land, I'm fine with him staying on coumadin if their office is ok with it.  Therefore patient does not need to stop and even if he already has then resume 5mg  MWF and 2.5mg  SSuTTh And return for a nurse visit INR check in one week after the surgery.

## 2014-03-17 NOTE — Telephone Encounter (Signed)
Daimeon called and states Dr Ihor Gully is ok with the INR value at 1.5 and wants to go ahead with the oral surgery. Dr Hommel's recommendation is to stay off coumadin for 5 days prior to oral surgery.   I called Dr Aletta Edouard office and spoke with Boneta Lucks. She reports Dr Ihor Gully does not require patient to stop coumadin prior to a tooth extraction. Dr Ihor Gully is recommending he go ahead and have his surgery tomorrow due to a draining abscess. Of course the final decision is up to Dr Ivan Anchors, per Boneta Lucks. Please advise.   Marcelo Baldy, DDS, MD, PA  Address: [address], [place], [place] [address] [phone]  Return call to patient and to Jenny/with Dr Barbaraann Faster office.

## 2014-03-17 NOTE — Telephone Encounter (Signed)
Dr Aletta Edouard office notified.

## 2014-03-17 NOTE — Telephone Encounter (Signed)
Pt was notified via vm 

## 2014-03-25 ENCOUNTER — Encounter: Payer: Self-pay | Admitting: Internal Medicine

## 2014-03-29 ENCOUNTER — Other Ambulatory Visit: Payer: Self-pay

## 2014-03-29 ENCOUNTER — Ambulatory Visit (INDEPENDENT_AMBULATORY_CARE_PROVIDER_SITE_OTHER): Payer: Medicare Other | Admitting: Family Medicine

## 2014-03-29 VITALS — BP 100/61 | HR 70 | Resp 16 | Wt 217.0 lb

## 2014-03-29 DIAGNOSIS — I482 Chronic atrial fibrillation, unspecified: Secondary | ICD-10-CM

## 2014-03-29 DIAGNOSIS — Z23 Encounter for immunization: Secondary | ICD-10-CM

## 2014-03-29 LAB — POCT INR: INR: 2.2

## 2014-03-29 MED ORDER — RAMIPRIL 2.5 MG PO CAPS: 2.5000 mg | ORAL_CAPSULE | Freq: Every day | ORAL | Status: DC

## 2014-03-29 NOTE — Progress Notes (Signed)
[name] [name] or [name] [name], Will you please let patient know that his INR was 2.2 which is therapeutic. Continue 5mg  MWF and 2.5mg  SSuTTh and recheck INR in one week, if therapeutic again we can space out checks to monthly.

## 2014-03-29 NOTE — Progress Notes (Signed)
I spoke with [name] and gave her dosing and re-check instructions. She voiced understanding. Corliss Skains, CMA

## 2014-03-30 ENCOUNTER — Ambulatory Visit (INDEPENDENT_AMBULATORY_CARE_PROVIDER_SITE_OTHER): Payer: Medicare Other | Admitting: Internal Medicine

## 2014-03-30 DIAGNOSIS — I480 Paroxysmal atrial fibrillation: Secondary | ICD-10-CM

## 2014-03-30 LAB — PULMONARY FUNCTION TEST
DL/VA % pred: 78 %
DL/VA: 3.78 ml/min/mmHg/L
DLCO UNC: 23.78 ml/min/mmHg
DLCO unc % pred: 63 %
FEF 25-75 Post: 2.36 L/sec
FEF 25-75 Pre: 1.91 L/sec
FEF2575-%CHANGE-POST: 23 %
FEF2575-%Pred-Post: 99 %
FEF2575-%Pred-Pre: 80 %
FEV1-%CHANGE-POST: 7 %
FEV1-%PRED-PRE: 82 %
FEV1-%Pred-Post: 89 %
FEV1-Post: 3.04 L
FEV1-Pre: 2.82 L
FEV1FVC-%CHANGE-POST: 7 %
FEV1FVC-%Pred-Pre: 95 %
FEV6-%CHANGE-POST: -1 %
FEV6-%PRED-PRE: 92 %
FEV6-%Pred-Post: 90 %
FEV6-PRE: 4.09 L
FEV6-Post: 4.03 L
FEV6FVC-%Change-Post: 0 %
FEV6FVC-%PRED-POST: 106 %
FEV6FVC-%PRED-PRE: 106 %
FVC-%Change-Post: 0 %
FVC-%PRED-POST: 86 %
FVC-%Pred-Pre: 86 %
FVC-PRE: 4.1 L
FVC-Post: 4.1 L
POST FEV1/FVC RATIO: 74 %
POST FEV6/FVC RATIO: 100 %
Pre FEV1/FVC ratio: 69 %
Pre FEV6/FVC Ratio: 100 %
RV % PRED: 70 %
RV: 2.03 L
TLC % PRED: 79 %
TLC: 6.26 L

## 2014-03-30 NOTE — Progress Notes (Signed)
PFT done today. 

## 2014-04-01 ENCOUNTER — Telehealth: Payer: Self-pay | Admitting: Cardiology

## 2014-04-01 DIAGNOSIS — R942 Abnormal results of pulmonary function studies: Secondary | ICD-10-CM

## 2014-04-01 NOTE — Telephone Encounter (Signed)
New message  ° ° °Returning call back to nurse.  °

## 2014-04-01 NOTE — Telephone Encounter (Signed)
Patient informed of PFT results and the need for a pulm consult. Will do ref in Epic.

## 2014-04-01 NOTE — Telephone Encounter (Signed)
New message     Pt returned Linda's call.  I gave him the appt time and date for his pul appt with Dr Marchelle Gearing.  If you needed him for something else call, if not you do not have to call back

## 2014-04-01 NOTE — Telephone Encounter (Signed)
Noted  

## 2014-04-01 NOTE — Telephone Encounter (Signed)
amb ref in Epic for Pulmon consult with dr. Carlyle Dolly 04/07/2014 at 1:30 pm. Patient aware of this.

## 2014-04-01 NOTE — Telephone Encounter (Signed)
Noted. Thanks.

## 2014-04-05 ENCOUNTER — Other Ambulatory Visit: Payer: Self-pay | Admitting: *Deleted

## 2014-04-05 MED ORDER — DIGOXIN 125 MCG PO TABS: 0.1250 mg | ORAL_TABLET | ORAL | Status: DC

## 2014-04-06 ENCOUNTER — Ambulatory Visit (INDEPENDENT_AMBULATORY_CARE_PROVIDER_SITE_OTHER): Payer: Medicare Other | Admitting: Family Medicine

## 2014-04-06 DIAGNOSIS — I4891 Unspecified atrial fibrillation: Secondary | ICD-10-CM

## 2014-04-06 LAB — POCT INR: INR: 1.6

## 2014-04-06 NOTE — Progress Notes (Signed)
Pt notified of coumadin instructions.

## 2014-04-06 NOTE — Progress Notes (Signed)
Andrea,Will you please let patient know that his INR was 1.6  which is below goal . Switch to 2.5 mg MWF and 5mg  SSuTTh (opposite of before) and recheck INR in one week,

## 2014-04-07 ENCOUNTER — Ambulatory Visit (INDEPENDENT_AMBULATORY_CARE_PROVIDER_SITE_OTHER): Payer: Medicare Other | Admitting: Internal Medicine

## 2014-04-07 ENCOUNTER — Encounter: Payer: Self-pay | Admitting: Internal Medicine

## 2014-04-07 VITALS — BP 114/62 | HR 73 | Ht 75.0 in | Wt 219.0 lb

## 2014-04-07 DIAGNOSIS — R942 Abnormal results of pulmonary function studies: Secondary | ICD-10-CM | POA: Insufficient documentation

## 2014-04-07 NOTE — Patient Instructions (Signed)
Please do  High Resolution CT chest without contrast on ILD protocol for abnormal pfT Will call with results to decide next step

## 2014-04-07 NOTE — Progress Notes (Signed)
Subjective:    Patient ID: [name] EdisVernon E Mercadel, male    DOB: 01/08/1932, 80 y.o.   MRN: [id number] PCP Laren BoomHommel, Sean, DO Cards  - Dr Armanda Magicraci Turner  HPI  IOV  04/07/2014  Chief Complaint  Patient presents with  . Pulmonary Consult    Pt referred by Dr. Armanda Magicraci Turner for abnormal PFT.    [name] [name] is a 80 y.o. male with a history of NIDCM, nonobstructive ASCAD, chronic CHF, HTN, PAF, OSA and dyslipidemia who presents for followup. He is followed in [facility] for NYHA class II-III CHF He is on CPAP followed by Dr Armanda Magicraci Turner. He is  On AMio for PAF and is on NSR as of cards visit sept 2015. HE had surveillance PFT while on amio 03/30/14 and showed isolated low dlco 24/63% but with some reductioin in TLC to 79% that suggests possibly restriction. THere is question of amio lung toxicity and therefore referred here. Spirometry is normal  He states he has been amio for 4 years. First few months possibly at 400mg  per day and then 200mg  per day till several weeks or few months ago down to 100mg  per day. He is concerned about its side effects but admits that is efficacious. He has general tiredness but no dyspnea or cough or edema or pnd     ECHO aug 2015  - LVEF 25%, PASP elevated at 48mmHg, RV dilated  Imaging hx   CT chest 2008 - benign nodules but no infilrate (has evidence of pulm htn)  CXR 2014  - acute pulm edema; no followup after that  CBC   hgb 13.5gm% hgb 12/23/13  Walk test 04/07/2014  - 185 feet x 3 laps: no desaturation    has a past medical history of Nonischemic cardiomyopathy; BPH (benign prostatic hypertrophy); Hypothyroidism; Left bundle branch block; Atrial fibrillation; Depression; Sleep apnea; Hyperlipidemia; Thrombocytopenia; Osteoarthritis; Acute on chronic systolic heart failure; Anemia, unspecified (04/07/2013); Hypertension; CHF (congestive heart failure); CHF (congestive heart failure); and Coronary artery disease (2008).    reports that he has never  smoked. He has never used smokeless tobacco.    has past surgical history that includes Transurethral resection of prostate; Cholecystectomy; Colonscopy; Cardioversion (N/A, 12/29/2012); and Cardiac defibrillator placement.  No Known Allergies  Current outpatient prescriptions:amiodarone (PACERONE) 200 MG tablet, Take 0.5 tablets (100 mg total) by mouth daily., Disp: 15 tablet, Rfl: 6;  carvedilol (COREG) 3.125 MG tablet, Take 1 tablet (3.125 mg total) by mouth 2 (two) times daily., Disp: 180 tablet, Rfl: 3;  digoxin (LANOXIN) 0.125 MG tablet, Take 1 tablet (0.125 mg total) by mouth every other day., Disp: 30 tablet, Rfl: 11 levothyroxine (SYNTHROID, LEVOTHROID) 125 MCG tablet, Take 1 tablet (125 mcg total) by mouth daily before breakfast., Disp: 90 tablet, Rfl: 1;  ramipril (ALTACE) 2.5 MG capsule, Take 1 capsule (2.5 mg total) by mouth daily., Disp: 90 capsule, Rfl: 1;  simvastatin (ZOCOR) 10 MG tablet, Take 1 tablet (10 mg total) by mouth every evening., Disp: 90 tablet, Rfl: 6 spironolactone (ALDACTONE) 25 MG tablet, Take 0.5 tablets (12.5 mg total) by mouth daily., Disp: 45 tablet, Rfl: 6;  traMADol (ULTRAM) 50 MG tablet, Take 0.5-1 tablets (25-50 mg total) by mouth every 8 (eight) hours as needed (neck pain)., Disp: 30 tablet, Rfl: 0;  warfarin (COUMADIN) 5 MG tablet, Take 1 tablet (5 mg total) by mouth daily at 6 PM. Only half a dose (2.5mg ) on Tuesdays, Disp: 30 tablet, Rfl: 5 torsemide (DEMADEX) 20 MG  tablet, Take 20 mg by mouth as needed. As needed for weight 215 or greater. Take 20 mg, Disp: , Rfl:    Immunization History  Administered Date(s) Administered  . Influenza,inj,Quad PF,36+ Mos 03/29/2014  . Tdap 06/30/2009     Review of Systems  Constitutional: Negative for fever and unexpected weight change.  HENT: Negative for congestion, dental problem, ear pain, nosebleeds, postnasal drip, rhinorrhea, sinus pressure, sneezing, sore throat and trouble swallowing.   Eyes: Negative for  redness and itching.  Respiratory: Positive for shortness of breath. Negative for cough, chest tightness and wheezing.   Cardiovascular: Negative for palpitations and leg swelling.  Gastrointestinal: Negative for nausea and vomiting.  Genitourinary: Negative for dysuria.  Musculoskeletal: Negative for joint swelling.  Skin: Negative for rash.  Neurological: Negative for headaches.  Hematological: Does not bruise/bleed easily.  Psychiatric/Behavioral: Negative for dysphoric mood. The patient is not nervous/anxious.        Objective:   Physical Exam  Nursing note and vitals reviewed. Constitutional: He is oriented to person, place, and time. He appears well-developed and well-nourished. No distress.  Body mass index is 27.37 kg/(m^2). Looks deconditioned  HENT:  Head: Normocephalic and atraumatic.  Right Ear: External ear normal.  Left Ear: External ear normal.  Mouth/Throat: Oropharynx is clear and moist. No oropharyngeal exudate.  Eyes: Conjunctivae and EOM are normal. Pupils are equal, round, and reactive to light. Right eye exhibits no discharge. Left eye exhibits no discharge. No scleral icterus.  Neck: Normal range of motion. Neck supple. No JVD present. No tracheal deviation present. No thyromegaly present.  Cardiovascular: Normal rate, regular rhythm and intact distal pulses.  Exam reveals no gallop and no friction rub.   No murmur heard. Pulmonary/Chest: Effort normal and breath sounds normal. No respiratory distress. He has no wheezes. He has no rales. He exhibits no tenderness.  Abdominal: Soft. Bowel sounds are normal. He exhibits no distension and no mass. There is no tenderness. There is no rebound and no guarding.  Musculoskeletal: Normal range of motion. He exhibits no edema and no tenderness.  Slow walk with cane  Lymphadenopathy:    He has no cervical adenopathy.  Neurological: He is alert and oriented to person, place, and time. He has normal reflexes. No cranial  nerve deficit. Coordination normal.  Skin: Skin is warm and dry. No rash noted. He is not diaphoretic. No erythema. No pallor.  Psychiatric: He has a normal mood and affect. His behavior is normal. Judgment and thought content normal.     Filed Vitals:   04/07/14 1334  BP: 114/62  Pulse: 73  Height: 6\' 3"  (1.905 m)  Weight: 219 lb (99.338 kg)  SpO2: 97%        Assessment & Plan:  #Abnormal PFT  He has findings of restriction with low dlco on PFT. HE is asymptomatic but this can be expected in early ILD. Causes for low dlco include anemia but he is not anemic. Also, will consider emphysema but he never smoked. Most likely this is either amio lung toxicity or pulmonary hypertension (see echo report) or other ILD. He needs HRCT chest to differentiate  Will look at results and then call him  With results    Dr. Kalman Shan, M.D., [facility].C.P Pulmonary and Critical Care Medicine Staff Physician [place] [facility] [place] [facility] Pager: [phone], If no answer or between  15:00h - 7:00h: call [phone]  04/07/2014 2:15 PM

## 2014-04-13 ENCOUNTER — Ambulatory Visit (INDEPENDENT_AMBULATORY_CARE_PROVIDER_SITE_OTHER)
Admission: RE | Admit: 2014-04-13 | Discharge: 2014-04-13 | Disposition: A | Discharge status: Home / Self Care | Payer: Medicare Other | Source: Ambulatory Visit | Attending: Internal Medicine | Admitting: Internal Medicine

## 2014-04-13 DIAGNOSIS — R942 Abnormal results of pulmonary function studies: Secondary | ICD-10-CM

## 2014-04-19 ENCOUNTER — Telehealth: Payer: Self-pay | Admitting: Internal Medicine

## 2014-04-19 DIAGNOSIS — R911 Solitary pulmonary nodule: Secondary | ICD-10-CM

## 2014-04-19 NOTE — Addendum Note (Signed)
Addended by: Nicanor Alcon on: 04/19/2014 02:30 PM   Modules accepted: Orders

## 2014-04-19 NOTE — Telephone Encounter (Signed)
Robynn Pane (cc to Dr Armanda Magic for fyi)  LEt [name] know that on CT chest   A) 1. No findings to suggest underlying interstitial lung disease.  So NO evidence of amio lung toxicity   B) 3. Two tiny pulmonary nodules in the right lower lobe measuring 3  and 4 mm on images 39 and 40 of series 5 respectively. These are  highly nonspecific. If the patient is at high risk for bronchogenic  carcinoma, follow-up chest CT at 1 year is recommended. So, please order it   C). HAve him return to see me in 6 months with full PFT for surveillance (I suspect low dlco is due to elevated pulm art pressures)  Thanks  Dr. Kalman Shan, M.D., [facility].C.P Pulmonary and Critical Care Medicine Staff Physician [place] [facility] [place] [facility] Pager: [phone], If no answer or between  15:00h - 7:00h: call [phone]  04/19/2014 6:00 AM

## 2014-04-19 NOTE — Telephone Encounter (Signed)
Called and spoke to pt. Informed pt of recs and results per MR. Pt verbalized understanding and denied any further questions or concerns at this time. Orders placed.

## 2014-04-29 ENCOUNTER — Ambulatory Visit (INDEPENDENT_AMBULATORY_CARE_PROVIDER_SITE_OTHER): Payer: Medicare Other | Admitting: Family Medicine

## 2014-04-29 VITALS — BP 97/54 | HR 74

## 2014-04-29 DIAGNOSIS — I4891 Unspecified atrial fibrillation: Secondary | ICD-10-CM

## 2014-04-29 LAB — POCT INR: INR: 1.6

## 2014-04-29 NOTE — Progress Notes (Signed)
Pt notified of coumadin instructions.

## 2014-04-29 NOTE — Patient Instructions (Signed)
[name] [name],Will you please let patient know that his INR was 1.6  which is below goal. Increase coumadin to 5mg  daily but only 2.5 on Monday and Friday. Recheck one week.

## 2014-05-10 ENCOUNTER — Ambulatory Visit (INDEPENDENT_AMBULATORY_CARE_PROVIDER_SITE_OTHER): Payer: Medicare Other | Admitting: Family Medicine

## 2014-05-10 VITALS — BP 98/59 | HR 72

## 2014-05-10 DIAGNOSIS — I4891 Unspecified atrial fibrillation: Secondary | ICD-10-CM

## 2014-05-10 LAB — POCT INR: INR: 1.6

## 2014-05-10 NOTE — Progress Notes (Signed)
[name] [name],Will you please let patient know that his INR was 1.6  which is below goal. Increase coumadin to a full 5mg  tablet daily.  Repeat INR one week.

## 2014-05-10 NOTE — Progress Notes (Signed)
Pt notified and voiced understanding 

## 2014-05-25 ENCOUNTER — Ambulatory Visit (INDEPENDENT_AMBULATORY_CARE_PROVIDER_SITE_OTHER): Payer: Medicare Other | Admitting: Internal Medicine

## 2014-05-25 ENCOUNTER — Encounter: Payer: Self-pay | Admitting: Internal Medicine

## 2014-05-25 VITALS — BP 124/78 | HR 71 | Ht 75.0 in | Wt 218.0 lb

## 2014-05-25 DIAGNOSIS — I10 Essential (primary) hypertension: Secondary | ICD-10-CM

## 2014-05-25 DIAGNOSIS — I4719 Other supraventricular tachycardia: Secondary | ICD-10-CM

## 2014-05-25 DIAGNOSIS — I447 Left bundle-branch block, unspecified: Secondary | ICD-10-CM

## 2014-05-25 DIAGNOSIS — I5022 Chronic systolic (congestive) heart failure: Secondary | ICD-10-CM

## 2014-05-25 DIAGNOSIS — I480 Paroxysmal atrial fibrillation: Secondary | ICD-10-CM

## 2014-05-25 DIAGNOSIS — I429 Cardiomyopathy, unspecified: Secondary | ICD-10-CM

## 2014-05-25 DIAGNOSIS — I471 Supraventricular tachycardia: Secondary | ICD-10-CM

## 2014-05-25 DIAGNOSIS — I428 Other cardiomyopathies: Secondary | ICD-10-CM

## 2014-05-25 LAB — MDC_IDC_ENUM_SESS_TYPE_INCLINIC
Brady Statistic AP VP Percent: 94.11 %
Brady Statistic AP VS Percent: 0.03 %
Brady Statistic AS VP Percent: 5.85 %
Brady Statistic AS VS Percent: 0 %
Brady Statistic RV Percent Paced: 99.96 %
HIGH POWER IMPEDANCE MEASURED VALUE: 399 Ohm
HighPow Impedance: 190 Ohm
HighPow Impedance: 40 Ohm
HighPow Impedance: 52 Ohm
Lead Channel Impedance Value: 437 Ohm
Lead Channel Impedance Value: 589 Ohm
Lead Channel Impedance Value: 969 Ohm
Lead Channel Pacing Threshold Amplitude: 0.75 V
Lead Channel Pacing Threshold Pulse Width: 0.4 ms
Lead Channel Pacing Threshold Pulse Width: 0.4 ms
Lead Channel Pacing Threshold Pulse Width: 0.4 ms
Lead Channel Sensing Intrinsic Amplitude: 0.875 mV
Lead Channel Sensing Intrinsic Amplitude: 9.875 mV
Lead Channel Sensing Intrinsic Amplitude: 9.875 mV
Lead Channel Setting Pacing Amplitude: 2.5 V
Lead Channel Setting Pacing Pulse Width: 0.4 ms
Lead Channel Setting Pacing Pulse Width: 0.4 ms
Lead Channel Setting Sensing Sensitivity: 0.3 mV
MDC IDC MSMT BATTERY VOLTAGE: 3 V
MDC IDC MSMT LEADCHNL LV IMPEDANCE VALUE: 532 Ohm
MDC IDC MSMT LEADCHNL LV PACING THRESHOLD AMPLITUDE: 1.125 V
MDC IDC MSMT LEADCHNL RA SENSING INTR AMPL: 0.625 mV
MDC IDC MSMT LEADCHNL RV IMPEDANCE VALUE: 513 Ohm
MDC IDC MSMT LEADCHNL RV PACING THRESHOLD AMPLITUDE: 0.75 V
MDC IDC SESS DTM: 20151201213200
MDC IDC SET LEADCHNL LV PACING AMPLITUDE: 2 V
MDC IDC SET LEADCHNL RA PACING AMPLITUDE: 2 V
MDC IDC SET ZONE DETECTION INTERVAL: 360 ms
MDC IDC SET ZONE DETECTION INTERVAL: 400 ms
MDC IDC STAT BRADY RA PERCENT PACED: 94.14 %
Zone Setting Detection Interval: 300 ms
Zone Setting Detection Interval: 340 ms

## 2014-05-25 NOTE — Patient Instructions (Addendum)
Your physician recommends that you continue on your current medications as directed. Please refer to the Current Medication list given to you today.  Your physician recommends that you return for Amiodarone surveillance lab work one week before going to see Dr. Gala Romney in January.   Remote monitoring is used to monitor your Pacemaker of ICD from home. This monitoring reduces the number of office visits required to check your device to one time per year. It allows [place] to keep an eye on the functioning of your device to ensure it is working properly. You are scheduled for a device check from home on 08-26-2014. You may send your transmission at any time that day. If you have a wireless device, the transmission will be sent automatically. After your physician reviews your transmission, you will receive a postcard with your next transmission date.  Your physician wants you to follow-up in: 1 year with Dr. Graciela Husbands.  You will receive a reminder letter in the mail two months in advance. If you don't receive a letter, please call our office to schedule the follow-up appointment.   Please schedule follow up appointment with Dr. Gala Romney in January.

## 2014-05-25 NOTE — Progress Notes (Signed)
Patient Care Team: Laren BoomSean Hommel, DO as PCP - General (Family Medicine) Quintella Reichertraci R Turner, MD as Consulting Physician (Cardiology)   HPI  [name] [name] is a 80 y.o. male Seen in followup for an ICD implanted for primary prevention in the setting of nonischemic cardiomyopathy identified and confirmed by catheterization in 2008. Because of symptoms of heart failure and left bundle branch block he underwent CRT-D implantation with a Medtronic device and a 6947 defibrillator lead. He had a lazerus-like effect.    He also has a history of atrial fibrillation For this he was treated with amiodarone. Surveillance labs 9/14 normal  Digoxin level 8/15 was 0.8  echocardiogram 8/14 was 25-30%  He has had inter current episode of syncope. Nothing was detected on his defibrillator.    otherwise he is well. He is limited by his knees   Past Medical History  Diagnosis Date  . Nonischemic cardiomyopathy     ef 10 %  . BPH (benign prostatic hypertrophy)   . Hypothyroidism   . Left bundle branch block   . Atrial fibrillation     fibrillation/flutter  . Depression   . Sleep apnea     on CPAP.    Marland Kitchen. Hyperlipidemia   . Thrombocytopenia   . Osteoarthritis   . Acute on chronic systolic heart failure   . Anemia, unspecified 04/07/2013  . Hypertension   . CHF (congestive heart failure)     Nonischemic DCM EF 10% s/p BiV AICD  . CHF (congestive heart failure)     Chronic systolic CHF  . Coronary artery disease 2008    nonobstructive ASCAD    Past Surgical History  Procedure Laterality Date  . Transurethral resection of prostate      for BPH  . Cholecystectomy    . Colonscopy      reportedly negative per pt; around 2005.    . Cardioversion N/A 12/29/2012    Procedure: CARDIOVERSION;  Surgeon: Quintella Reichertraci R Turner, MD;  Location: [facility] ENDOSCOPY;  Service: Cardiovascular;  Laterality: N/A;  . Cardiac defibrillator placement      left chest    Current Outpatient Prescriptions  Medication  Sig Dispense Refill  . amiodarone (PACERONE) 200 MG tablet Take 0.5 tablets (100 mg total) by mouth daily. 15 tablet 6  . carvedilol (COREG) 3.125 MG tablet Take 1 tablet (3.125 mg total) by mouth 2 (two) times daily. 180 tablet 3  . digoxin (LANOXIN) 0.125 MG tablet Take 1 tablet (0.125 mg total) by mouth every other day. 30 tablet 11  . levothyroxine (SYNTHROID, LEVOTHROID) 125 MCG tablet Take 1 tablet (125 mcg total) by mouth daily before breakfast. 90 tablet 1  . ramipril (ALTACE) 2.5 MG capsule Take 1 capsule (2.5 mg total) by mouth daily. 90 capsule 1  . simvastatin (ZOCOR) 10 MG tablet Take 1 tablet (10 mg total) by mouth every evening. 90 tablet 6  . spironolactone (ALDACTONE) 25 MG tablet Take 0.5 tablets (12.5 mg total) by mouth daily. 45 tablet 6  . torsemide (DEMADEX) 20 MG tablet Take 20 mg by mouth as needed. As needed for weight 215 or greater. Take 20 mg    . warfarin (COUMADIN) 5 MG tablet Take 1 tablet (5 mg total) by mouth daily at 6 PM. Only half a dose (2.5mg ) on Tuesdays 30 tablet 5   No current facility-administered medications for this visit.    No Known Allergies  Review of Systems negative except from HPI and PMH  Physical  Exam BP 124/78 mmHg  Pulse 71  Ht 6\' 3"  (1.905 m)  Wt 218 lb (98.884 kg)  BMI 27.25 kg/m2 Well developed and well nourished in no acute distress HENT normal E scleral and icterus clear Neck Supple JVP flat; carotids brisk and full Clear to ausculation  Regular rate and rhythm, no murmurs gallops or rub Soft with active bowel sounds No clubbing cyanosis 1+Edema Alert and oriented, grossly normal motor and sensory function Skin Warm and Dry  AV pacing   Assessment and  Plan  Nonischemic cardiomyopathy    congestive heart failure-chronic-systolic   atrial fibrillation-paroxysmal  Monitoring for high-risk medications    implantable defibrillator-CRT-Medtronic  Overall the patient is doing well. He is euvolemic. While he has  some edema, there is no jugular venous distention. His weights at home have been stable.  He limited functionally by his knees; I think his heart failure is well compensated. He has infrequent atrial fibrillation. He is currentl managed with warfarin and amiodarone. Surveillance laboratories for this high risk medication are indicated and we will draw that at his next visit, scheduled with Dr. Dorthea Cove and heart failure in January.   Echocardiogram is scheduled at that time.  y

## 2014-06-02 ENCOUNTER — Ambulatory Visit (INDEPENDENT_AMBULATORY_CARE_PROVIDER_SITE_OTHER): Payer: Medicare Other | Admitting: Family Medicine

## 2014-06-02 ENCOUNTER — Telehealth: Payer: Self-pay

## 2014-06-02 VITALS — BP 99/56 | HR 74

## 2014-06-02 DIAGNOSIS — I4891 Unspecified atrial fibrillation: Secondary | ICD-10-CM

## 2014-06-02 LAB — POCT INR: INR: 2.4

## 2014-06-02 NOTE — Patient Instructions (Signed)
[name] [name], will you please let patient know INR was at goal.  My understanding is that he has been taking 5mg of coumadin daily, if this is correct continue this regimen and repeat inr in one month. 

## 2014-06-02 NOTE — Progress Notes (Signed)
[name] [name], will you please let patient know INR was at goal.  My understanding is that he has been taking 5mg of coumadin daily, if this is correct continue this regimen and repeat inr in one month. 

## 2014-06-02 NOTE — Telephone Encounter (Signed)
Spoke to patient wife and gave her instructions as noted below. Rhonda Cunningham,CMA

## 2014-06-02 NOTE — Telephone Encounter (Signed)
-----   Message from [place], [place] sent at 06/02/2014 12:15 PM EST ----- Sue Lush, will you please let patient know INR was at goal.  My understanding is that he has been taking 5mg  of coumadin daily, if this is correct continue this regimen and repeat inr in one month.

## 2014-06-22 ENCOUNTER — Other Ambulatory Visit: Payer: Self-pay | Admitting: Family Medicine

## 2014-06-22 MED ORDER — WARFARIN SODIUM 5 MG PO TABS
5.0000 mg | ORAL_TABLET | Freq: Every day | ORAL | Status: DC
Start: 2014-06-22 — End: 2015-01-17

## 2014-06-22 NOTE — Progress Notes (Signed)
[name] [name] confirmed patient is currently taking 5mg  daily. With no half dose during the week.

## 2014-06-23 ENCOUNTER — Encounter: Payer: Self-pay | Admitting: Cardiology

## 2014-07-02 ENCOUNTER — Other Ambulatory Visit: Payer: Medicare Other

## 2014-07-06 ENCOUNTER — Other Ambulatory Visit (INDEPENDENT_AMBULATORY_CARE_PROVIDER_SITE_OTHER): Payer: Medicare Other | Admitting: *Deleted

## 2014-07-06 DIAGNOSIS — E785 Hyperlipidemia, unspecified: Secondary | ICD-10-CM

## 2014-07-06 LAB — HEPATIC FUNCTION PANEL
ALBUMIN: 4.3 g/dL (ref 3.5–5.2)
ALT: 18 U/L (ref 0–53)
AST: 22 U/L (ref 0–37)
Alkaline Phosphatase: 94 U/L (ref 39–117)
Bilirubin, Direct: 0.1 mg/dL (ref 0.0–0.3)
Total Bilirubin: 1.1 mg/dL (ref 0.2–1.2)
Total Protein: 7.4 g/dL (ref 6.0–8.3)

## 2014-07-07 ENCOUNTER — Ambulatory Visit (INDEPENDENT_AMBULATORY_CARE_PROVIDER_SITE_OTHER): Payer: Medicare Other | Admitting: Family Medicine

## 2014-07-07 ENCOUNTER — Telehealth: Payer: Self-pay | Admitting: *Deleted

## 2014-07-07 VITALS — BP 109/67 | HR 73

## 2014-07-07 DIAGNOSIS — I4891 Unspecified atrial fibrillation: Secondary | ICD-10-CM

## 2014-07-07 LAB — POCT INR: INR: 2.2

## 2014-07-07 NOTE — Telephone Encounter (Signed)
-----   Message from Laren Boom, [place] sent at 07/07/2014 10:46 AM EST ----- Sue Lush, will you please let patient know INR was at goal.  My understanding is that he has been taking 5mg  of coumadin daily, if this is correct continue this regimen and repeat inr in one month.

## 2014-07-07 NOTE — Telephone Encounter (Signed)
Message left on vm 

## 2014-07-07 NOTE — Progress Notes (Signed)
[name] [name], will you please let patient know INR was at goal.  My understanding is that he has been taking 5mg of coumadin daily, if this is correct continue this regimen and repeat inr in one month. 

## 2014-07-08 LAB — NMR LIPOPROFILE WITH LIPIDS
CHOLESTEROL, TOTAL: 134 mg/dL (ref 100–199)
HDL PARTICLE NUMBER: 24.7 umol/L — AB (ref >=30.5)
HDL SIZE: 9.5 nm (ref >=9.2)
HDL-C: 44 mg/dL (ref >39)
LDL CALC: 75 mg/dL (ref 0–99)
LDL Particle Number: 750 nmol/L (ref <1000)
LDL SIZE: 21.5 nm (ref >=20.8)
Large HDL-P: 6.3 umol/L (ref >=4.8)
Large VLDL-P: 1.2 nmol/L (ref <=2.7)
Small LDL Particle Number: 246 nmol/L (ref <=527)
Triglycerides: 76 mg/dL (ref 0–149)
VLDL Size: 42 nm (ref <=46.6)

## 2014-07-28 ENCOUNTER — Ambulatory Visit (HOSPITAL_BASED_OUTPATIENT_CLINIC_OR_DEPARTMENT_OTHER)
Admission: RE | Admit: 2014-07-28 | Discharge: 2014-07-28 | Disposition: A | Discharge status: Home / Self Care | Payer: Medicare Other | Source: Ambulatory Visit | Attending: Internal Medicine | Admitting: Internal Medicine

## 2014-07-28 ENCOUNTER — Ambulatory Visit (HOSPITAL_COMMUNITY)
Admission: RE | Admit: 2014-07-28 | Discharge: 2014-07-28 | Disposition: A | Discharge status: Home / Self Care | Payer: Medicare Other | Source: Ambulatory Visit | Attending: Internal Medicine | Admitting: Internal Medicine

## 2014-07-28 VITALS — BP 122/74 | HR 75 | Wt 215.0 lb

## 2014-07-28 DIAGNOSIS — I509 Heart failure, unspecified: Secondary | ICD-10-CM

## 2014-07-28 DIAGNOSIS — I471 Supraventricular tachycardia: Secondary | ICD-10-CM

## 2014-07-28 DIAGNOSIS — I5022 Chronic systolic (congestive) heart failure: Secondary | ICD-10-CM | POA: Diagnosis present

## 2014-07-28 DIAGNOSIS — G4733 Obstructive sleep apnea (adult) (pediatric): Secondary | ICD-10-CM

## 2014-07-28 DIAGNOSIS — Z7901 Long term (current) use of anticoagulants: Secondary | ICD-10-CM

## 2014-07-28 LAB — BASIC METABOLIC PANEL
ANION GAP: 6 (ref 5–15)
BUN: 20 mg/dL (ref 6–23)
CHLORIDE: 105 mmol/L (ref 96–112)
CO2: 29 mmol/L (ref 19–32)
Calcium: 9.3 mg/dL (ref 8.4–10.5)
Creatinine, Ser: 1.46 mg/dL — ABNORMAL HIGH (ref 0.50–1.35)
GFR calc Af Amer: 51 mL/min — ABNORMAL LOW (ref >90)
GFR calc non Af Amer: 44 mL/min — ABNORMAL LOW (ref >90)
Glucose, Bld: 87 mg/dL (ref 70–99)
Potassium: 4.5 mmol/L (ref 3.5–5.1)
SODIUM: 140 mmol/L (ref 135–145)

## 2014-07-28 LAB — DIGOXIN LEVEL: Digoxin Level: 0.6 ng/mL — ABNORMAL LOW (ref 0.8–2.0)

## 2014-07-28 LAB — TSH: TSH: 1.928 u[IU]/mL (ref 0.350–4.500)

## 2014-07-28 NOTE — Progress Notes (Signed)
Patient ID: BRAILYN DELMAN, male   DOB: [birth year], 81 y.o.   MRN: [id number] Primary Cardiologist: Dr Mayford Knife PCP: Dr Arlee Muslim  HPI: Mr Harmes is a 81 year old with a history of A tach S/P successful DC-CV 12/29/12, CAD, PAF on chronic coumadin and amiodarone, OSA- CPAP, NICM cath 2008, chronic systolic heart failure EF 25-30% (01/2013) , S/P Medtronic CRT-D 2008 and generator changed 2012, LBBB, CRI (baseline 1.7-1.8) and Hypothyroidism.   He is S/P DC-CV 12/29/12 and initially he felt good for about a week. He has had a functional decline and only able to walk a few steps. He presented [facility] ED 01/22/13 with increased dyspnea on exertion and low extremity edema. He was started on a lasix gtt 10 mg hr. Admit weight 233 lbs. Discharge weight: 214 lbs  He returns for follow up.  Denies SOB/PND/Orthopnea/CP. No bleeding problems. Compliant with medications. Minimal activity at home due to knee pain. Weight at home 208-211 pounds. He has not required Torsemide. Using CPAP nightly. Taking all medications.   Optivol: Interrogated, no crossings over threshold. Activity < 1 hour. No Afib   Labs 8/21: Na 132 K 4.5 Cr 1.7 (stable)  Labs 03/02/13 NA 135 Potassium 4.6 Creatinine 1.6 Dig 1.6 TSH 3.95        04/02/13: digoxin 0.8, K+ 4.5, creatinine 1.48         09/08/13 K 4.4 Creatine 1.7           ROS: All systems negative except as listed in HPI, PMH and Problem List.  Past Medical History  Diagnosis Date  . Nonischemic cardiomyopathy     ef 10 %  . BPH (benign prostatic hypertrophy)   . Hypothyroidism   . Left bundle branch block   . Atrial fibrillation     fibrillation/flutter  . Depression   . Sleep apnea     on CPAP.    Marland Kitchen Hyperlipidemia   . Thrombocytopenia   . Osteoarthritis   . Acute on chronic systolic heart failure   . Anemia, unspecified 04/07/2013  . Hypertension   . CHF (congestive heart failure)     Nonischemic DCM EF 10% s/p BiV AICD  . CHF (congestive heart failure)     Chronic  systolic CHF  . Coronary artery disease 2008    nonobstructive ASCAD    Current Outpatient Prescriptions  Medication Sig Dispense Refill  . amiodarone (PACERONE) 200 MG tablet Take 0.5 tablets (100 mg total) by mouth daily. 15 tablet 6  . carvedilol (COREG) 3.125 MG tablet Take 1 tablet (3.125 mg total) by mouth 2 (two) times daily. 180 tablet 3  . digoxin (LANOXIN) 0.125 MG tablet Take 1 tablet (0.125 mg total) by mouth every other day. 30 tablet 11  . levothyroxine (SYNTHROID, LEVOTHROID) 125 MCG tablet Take 1 tablet (125 mcg total) by mouth daily before breakfast. 90 tablet 1  . ramipril (ALTACE) 2.5 MG capsule Take 1 capsule (2.5 mg total) by mouth daily. 90 capsule 1  . simvastatin (ZOCOR) 10 MG tablet Take 1 tablet (10 mg total) by mouth every evening. 90 tablet 6  . spironolactone (ALDACTONE) 25 MG tablet Take 0.5 tablets (12.5 mg total) by mouth daily. 45 tablet 6  . torsemide (DEMADEX) 20 MG tablet Take 20 mg by mouth as needed. As needed for weight 215 or greater. Take 20 mg    . warfarin (COUMADIN) 5 MG tablet Take 1 tablet (5 mg total) by mouth daily at 6 PM. 30 tablet 5  No current facility-administered medications for this encounter.    Filed Vitals:   07/28/14 1147  BP: 122/74  Pulse: 75  Weight: 215 lb (97.523 kg)  SpO2: 99%    PHYSICAL EXAM: General: Elderly appearing. No resp difficulty Ambulated into the clinic with a cane.  HEENT: normal  Neck: supple. JVP flat. Carotids 2+ bilat; no bruits. No lymphadenopathy or thryomegaly appreciated.  Cor: PMI nondisplaced. Regular rate & rhythm. No rubs, gallops. Soft 2/6 MR  Lungs: clear  Abdomen: soft, nontender, nondistended. No hepatosplenomegaly. No bruits or masses. Good bowel sounds.  Extremities: no cyanosis, clubbing, rash, R and LLE no edema  Neuro: alert & orientedx3, cranial nerves grossly intact. moves all 4 extremities w/o difficulty. Affect pleasant   ASSESSMENT & PLAN:  1) Chronic systolic HF, NICM  01/2013 EF 20%; Optivol CRT-- NYHA II-III symptoms. Volume status stable. Continue torsemide as needed for weight > 215 pounds.  Continue spironolactone 12.5 mg daily Continue carvedilol 3.125 mg twice a day will not increase due to fatigue and continue digoxin 0.125 mg every other day. Check dig level today.   Continue  ramipril 2.5 mg daily - Optivol- No crossings over threshold, no afib. Activity < 1 hour per day.  - Reinforced the need and importance of daily weights, a low sodium diet, and fluid restriction (less than 2 L a day). Instructed to call the HF clinic if weight increases more than 3 lbs overnight or 5 lbs in a week.   2) OSA-Continue CPAP at night 3) Atrial tach/AF-- Rate controlled.  Continue amiodarone 100 mg daily. LFTs on 07/06/14. Check TSH today.  Recommended go for a yearly eye exam. - In the past offered NOAC however he can not afford. Continue coumadin. INR per Dr Zachery Dauer.    Check BMET, dig level, and TSH now. Will call back with ECHO results.   Follow up in 4 months  Tyrese Capriotti NP-C 11:50 AM

## 2014-07-28 NOTE — Patient Instructions (Signed)
Will call you with echo and lab results later today.  Follow up 3 months.  Do the following things EVERYDAY: 1) Weigh yourself in the morning before breakfast. Write it down and keep it in a log. 2) Take your medicines as prescribed 3) Eat low salt foods-Limit salt (sodium) to 2000 mg per day.  4) Stay as active as you can everyday 5) Limit all fluids for the day to less than 2 liters

## 2014-07-28 NOTE — Progress Notes (Signed)
Echocardiogram 2D Echocardiogram has been performed.  [name] [name] 07/28/2014, 11:50 AM

## 2014-07-29 ENCOUNTER — Telehealth (HOSPITAL_COMMUNITY): Payer: Self-pay | Admitting: Vascular Surgery

## 2014-07-29 NOTE — Telephone Encounter (Signed)
Pt called for echo results

## 2014-07-29 NOTE — Telephone Encounter (Signed)
Echo and lab results reviewed with patient.

## 2014-08-19 ENCOUNTER — Encounter: Payer: Self-pay | Admitting: Internal Medicine

## 2014-08-20 ENCOUNTER — Ambulatory Visit (INDEPENDENT_AMBULATORY_CARE_PROVIDER_SITE_OTHER): Payer: Medicare Other | Admitting: Family Medicine

## 2014-08-20 ENCOUNTER — Telehealth: Payer: Self-pay | Admitting: *Deleted

## 2014-08-20 VITALS — BP 100/57 | HR 73

## 2014-08-20 DIAGNOSIS — I4891 Unspecified atrial fibrillation: Secondary | ICD-10-CM

## 2014-08-20 LAB — POCT INR: INR: 2.2

## 2014-08-20 NOTE — Telephone Encounter (Signed)
-----   Message from [place], [place] sent at 08/20/2014 12:01 PM EST ----- Sue Lush, will you please let patient know INR was at goal.  My understanding is that he has been taking 5mg  of coumadin daily, if this is correct continue this regimen and repeat inr in one month.

## 2014-08-20 NOTE — Telephone Encounter (Signed)
Pt spouse notified. 

## 2014-08-20 NOTE — Progress Notes (Signed)
[name] [name], will you please let patient know INR was at goal.  My understanding is that he has been taking 5mg of coumadin daily, if this is correct continue this regimen and repeat inr in one month. 

## 2014-08-26 ENCOUNTER — Encounter: Payer: Self-pay | Admitting: Internal Medicine

## 2014-08-26 ENCOUNTER — Ambulatory Visit (INDEPENDENT_AMBULATORY_CARE_PROVIDER_SITE_OTHER): Payer: Medicare Other | Admitting: *Deleted

## 2014-08-26 DIAGNOSIS — I5022 Chronic systolic (congestive) heart failure: Secondary | ICD-10-CM

## 2014-08-26 DIAGNOSIS — I429 Cardiomyopathy, unspecified: Secondary | ICD-10-CM

## 2014-08-26 DIAGNOSIS — I428 Other cardiomyopathies: Secondary | ICD-10-CM

## 2014-08-26 NOTE — Progress Notes (Signed)
Remote ICD transmission.   

## 2014-08-29 LAB — MDC_IDC_ENUM_SESS_TYPE_REMOTE
Battery Voltage: 2.97 V
Brady Statistic AP VP Percent: 97.32 %
Brady Statistic AS VS Percent: 0.01 %
Date Time Interrogation Session: 20160303152019
HighPow Impedance: 380 Ohm
HighPow Impedance: 42 Ohm
HighPow Impedance: 53 Ohm
Lead Channel Impedance Value: 570 Ohm
Lead Channel Pacing Threshold Pulse Width: 0.4 ms
Lead Channel Pacing Threshold Pulse Width: 0.4 ms
Lead Channel Sensing Intrinsic Amplitude: 0.875 mV
Lead Channel Sensing Intrinsic Amplitude: 11.125 mV
Lead Channel Setting Pacing Amplitude: 2 V
Lead Channel Setting Pacing Amplitude: 2.5 V
Lead Channel Setting Pacing Amplitude: 2.5 V
MDC IDC MSMT LEADCHNL LV IMPEDANCE VALUE: 532 Ohm
MDC IDC MSMT LEADCHNL LV IMPEDANCE VALUE: 950 Ohm
MDC IDC MSMT LEADCHNL LV PACING THRESHOLD AMPLITUDE: 1.125 V
MDC IDC MSMT LEADCHNL RA IMPEDANCE VALUE: 437 Ohm
MDC IDC MSMT LEADCHNL RA PACING THRESHOLD AMPLITUDE: 0.625 V
MDC IDC MSMT LEADCHNL RA SENSING INTR AMPL: 0.875 mV
MDC IDC MSMT LEADCHNL RV IMPEDANCE VALUE: 456 Ohm
MDC IDC MSMT LEADCHNL RV PACING THRESHOLD AMPLITUDE: 0.75 V
MDC IDC MSMT LEADCHNL RV PACING THRESHOLD PULSEWIDTH: 0.4 ms
MDC IDC MSMT LEADCHNL RV SENSING INTR AMPL: 11.125 mV
MDC IDC SET LEADCHNL LV PACING PULSEWIDTH: 0.4 ms
MDC IDC SET LEADCHNL RV PACING PULSEWIDTH: 0.4 ms
MDC IDC SET LEADCHNL RV SENSING SENSITIVITY: 0.3 mV
MDC IDC SET ZONE DETECTION INTERVAL: 300 ms
MDC IDC SET ZONE DETECTION INTERVAL: 340 ms
MDC IDC SET ZONE DETECTION INTERVAL: 400 ms
MDC IDC STAT BRADY AP VS PERCENT: 0.02 %
MDC IDC STAT BRADY AS VP PERCENT: 2.64 %
MDC IDC STAT BRADY RA PERCENT PACED: 97.35 %
MDC IDC STAT BRADY RV PERCENT PACED: 99.96 %
Zone Setting Detection Interval: 360 ms

## 2014-09-02 ENCOUNTER — Encounter: Payer: Self-pay | Admitting: Cardiology

## 2014-09-02 ENCOUNTER — Ambulatory Visit (INDEPENDENT_AMBULATORY_CARE_PROVIDER_SITE_OTHER): Payer: Medicare Other | Admitting: Cardiology

## 2014-09-02 VITALS — BP 130/72 | HR 72 | Ht 76.0 in | Wt 214.1 lb

## 2014-09-02 DIAGNOSIS — G4733 Obstructive sleep apnea (adult) (pediatric): Secondary | ICD-10-CM

## 2014-09-02 DIAGNOSIS — I2583 Coronary atherosclerosis due to lipid rich plaque: Secondary | ICD-10-CM

## 2014-09-02 DIAGNOSIS — E785 Hyperlipidemia, unspecified: Secondary | ICD-10-CM

## 2014-09-02 DIAGNOSIS — I10 Essential (primary) hypertension: Secondary | ICD-10-CM

## 2014-09-02 DIAGNOSIS — I480 Paroxysmal atrial fibrillation: Secondary | ICD-10-CM

## 2014-09-02 DIAGNOSIS — I5022 Chronic systolic (congestive) heart failure: Secondary | ICD-10-CM

## 2014-09-02 DIAGNOSIS — I2510 Atherosclerotic heart disease of native coronary artery without angina pectoris: Secondary | ICD-10-CM

## 2014-09-02 NOTE — Patient Instructions (Signed)
Your physician recommends that you continue on your current medications as directed. Please refer to the Current Medication list given to you today.  Your physician wants you to follow-up in: 6 months with Dr Turner You will receive a reminder letter in the mail two months in advance. If you don't receive a letter, please call our office to schedule the follow-up appointment.  

## 2014-09-02 NOTE — Progress Notes (Signed)
Cardiology Office Note   Date:  09/02/2014   ID:  [name] [name], DOB [birth year], MRN [id number]  PCP:  Laren Boom, DO  Cardiologist:   Quintella Reichert, MD   Chief Complaint  Patient presents with  . Coronary Artery Disease  . Hypertension  . Congestive Heart Failure  . Sleep Apnea      History of Present Illness: [name] [name] is a 81 y.o. male with a history of nonobstructive ASCAD, NICM by cath 2008 (EF 35-40% by echo 07/2014) with chronic CHF s/p Medtronic CRT-D, LBBB, CRI (baseline 1.7-1.8), HTN, PAF, OSA and dyslipidemia who presents for followup. He is followed in [facility] clinic for NYHA class II-III CHF. He is doing much better.  His weight has been maintaining around 214lbs (211lbs at home). He denies any SOB, LE edema, palpitations or syncope. He occasionally will have some dizziness when going from sitting to standing.  He walks some for exercise.    Past Medical History  Diagnosis Date  . Nonischemic cardiomyopathy     ef 10 %  . BPH (benign prostatic hypertrophy)   . Hypothyroidism   . Left bundle branch block   . Atrial fibrillation     fibrillation/flutter  . Depression   . Sleep apnea     stopped using CPAP  . Hyperlipidemia   . Thrombocytopenia   . Osteoarthritis   . Acute on chronic systolic heart failure   . Anemia, unspecified 04/07/2013  . Hypertension   . CHF (congestive heart failure)     Nonischemic DCM EF 10% s/p BiV AICD  . CHF (congestive heart failure)     Chronic systolic CHF  . Coronary artery disease 2008    nonobstructive ASCAD    Past Surgical History  Procedure Laterality Date  . Transurethral resection of prostate      for BPH  . Cholecystectomy    . Colonscopy      reportedly negative per pt; around 2005.    . Cardioversion N/A 12/29/2012    Procedure: CARDIOVERSION;  Surgeon: Quintella Reichert, MD;  Location: [facility];  Service: Cardiovascular;  Laterality: N/A;  . Cardiac defibrillator placement      left  chest     Current Outpatient Prescriptions  Medication Sig Dispense Refill  . amiodarone (PACERONE) 200 MG tablet Take 0.5 tablets (100 mg total) by mouth daily. 15 tablet 6  . carvedilol (COREG) 3.125 MG tablet Take 1 tablet (3.125 mg total) by mouth 2 (two) times daily. 180 tablet 3  . digoxin (LANOXIN) 0.125 MG tablet Take 1 tablet (0.125 mg total) by mouth every other day. 30 tablet 11  . levothyroxine (SYNTHROID, LEVOTHROID) 125 MCG tablet Take 1 tablet (125 mcg total) by mouth daily before breakfast. 90 tablet 1  . potassium chloride SA (K-DUR,KLOR-CON) 20 MEQ tablet Take 20 mEq by mouth as needed. When you take Torsemide    . ramipril (ALTACE) 2.5 MG capsule Take 1 capsule (2.5 mg total) by mouth daily. 90 capsule 1  . simvastatin (ZOCOR) 10 MG tablet Take 1 tablet (10 mg total) by mouth every evening. 90 tablet 6  . spironolactone (ALDACTONE) 25 MG tablet Take 0.5 tablets (12.5 mg total) by mouth daily. 45 tablet 6  . torsemide (DEMADEX) 20 MG tablet Take 20 mg by mouth as needed. As needed for weight 215 or greater. Take 20 mg    . warfarin (COUMADIN) 5 MG tablet Take 1 tablet (5 mg total) by mouth daily at  6 PM. 30 tablet 5   No current facility-administered medications for this visit.    Allergies:   Review of patient's allergies indicates no known allergies.    Social History:  The patient  reports that he has never smoked. He has never used smokeless tobacco. He reports that he does not drink alcohol or use illicit drugs.   Family History:  The patient's family history includes Cancer in his maternal uncle; Heart disease in his father; Heart failure in his mother.    ROS:  Please see the history of present illness.   Otherwise, review of systems are positive for none.   All other systems are reviewed and negative.    PHYSICAL EXAM: VS:  BP 130/72 mmHg  Pulse 72  Ht 6\' 4"  (1.93 m)  Wt 214 lb 1.9 oz (97.124 kg)  BMI 26.07 kg/m2  SpO2 96% , BMI Body mass index is 26.07  kg/(m^2). GEN: Well nourished, well developed, in no acute distress HEENT: normal Neck: no JVD, carotid bruits, or masses Cardiac: RRR; no murmurs, rubs, or gallops,no edema  Respiratory:  clear to auscultation bilaterally, normal work of breathing GI: soft, nontender, nondistended, + BS MS: no deformity or atrophy Skin: warm and dry, no rash Neuro:  Strength and sensation are intact Psych: euthymic mood, full affect   EKG:  EKG is not ordered today.    Recent Labs: 12/23/2013: Hemoglobin 13.5; Platelets 154 07/06/2014: ALT 18 07/28/2014: BUN 20; Creatinine 1.46*; Potassium 4.5; Sodium 140; TSH 1.928    Lipid Panel    Component Value Date/Time   CHOL 134 07/06/2014 1034   TRIG 76 07/06/2014 1034   HDL 44 07/06/2014 1034   LDLCALC 75 07/06/2014 1034      Wt Readings from Last 3 Encounters:  09/02/14 214 lb 1.9 oz (97.124 kg)  07/28/14 215 lb (97.523 kg)  05/25/14 218 lb (98.884 kg)       ASSESSMENT AND PLAN:  1. PAF maintaining NSR on Amiodarone  - continue Amio/Coreg/Warfarin  - he is up to date on blood work and PFTs for Amio.  Last PFTs showed reduced DLCO and chest CT showed no evidence of amio lung toxicity per Dr. Marchelle Gearing.  He has followup with him this spring.   2. Chronic systolic CHF NYHA class II - compensated with stable weight - he will continue to monitor his weights and sodium intake. His weight is maintaining around 210-212lbs with no clothes on. His weight today was 211 at home. - continue Torsemide on as needed basis if weight >215lbs  - continue every other day digoxin  - continue Aldactone/ACE I/Coreg  - continue daily weights with SS Lasix coverage 3. Dyslipidemia - near goal with LDL 75 - continue simvastatin  4. HTN controlled  - continue Coreg/Lisinopril 5. OSA he stopped using his CPAP - he was not sleeping well with it 6. Nonobstructive ASCAD with no angina  Current medicines are reviewed at length with the patient today.  The patient  does not have concerns regarding medicines.  The following changes have been made:  no change  Labs/ tests ordered today include: None  No orders of the defined types were placed in this encounter.     Disposition:   FU with me in 6 months   Signed, Quintella Reichert, MD  09/02/2014 10:45 AM    [facility] [address] [place], [place], [place]  [address] Phone: [phone]; Fax: [phone]

## 2014-09-14 ENCOUNTER — Telehealth: Payer: Self-pay | Admitting: Cardiology

## 2014-09-14 NOTE — Telephone Encounter (Signed)
New message      Have a question for [facility].  He would not tell me what he wanted

## 2014-09-14 NOTE — Telephone Encounter (Signed)
Left message to call back  

## 2014-09-20 ENCOUNTER — Other Ambulatory Visit: Payer: Self-pay | Admitting: Family Medicine

## 2014-09-20 MED ORDER — LEVOTHYROXINE SODIUM 125 MCG PO TABS: 125.0000 ug | ORAL_TABLET | Freq: Every day | ORAL | Status: DC

## 2014-09-20 NOTE — Telephone Encounter (Signed)
Patient st he wanted [place] to give him an incentive spirometer, but he has since purchased one on his own.  Informed patient that we do not have IS to give to patients, but I am glad he is using it.  Patient grateful for callback.

## 2014-09-23 ENCOUNTER — Other Ambulatory Visit: Payer: Self-pay | Admitting: *Deleted

## 2014-09-23 MED ORDER — RAMIPRIL 2.5 MG PO CAPS: 2.5000 mg | ORAL_CAPSULE | Freq: Every day | ORAL | Status: DC

## 2014-09-27 ENCOUNTER — Encounter: Payer: Self-pay | Admitting: Cardiology

## 2014-09-30 ENCOUNTER — Encounter: Payer: Medicare Other | Admitting: Sports Medicine

## 2014-10-06 ENCOUNTER — Telehealth: Payer: Self-pay | Admitting: *Deleted

## 2014-10-06 ENCOUNTER — Ambulatory Visit (INDEPENDENT_AMBULATORY_CARE_PROVIDER_SITE_OTHER): Payer: Medicare Other | Admitting: Family Medicine

## 2014-10-06 VITALS — BP 101/66 | HR 74

## 2014-10-06 DIAGNOSIS — I480 Paroxysmal atrial fibrillation: Secondary | ICD-10-CM

## 2014-10-06 LAB — POCT INR: INR: 2.3

## 2014-10-06 NOTE — Progress Notes (Signed)
[name] [name], will you please let patient know INR was at goal.  My understanding is that he has been taking 5mg of coumadin daily, if this is correct continue this regimen and repeat inr in one month. 

## 2014-10-06 NOTE — Telephone Encounter (Signed)
Pt.notified

## 2014-10-06 NOTE — Telephone Encounter (Signed)
-----   Message from Laren Boom, [place] sent at 10/06/2014  1:10 PM EDT ----- Sue Lush, will you please let patient know INR was at goal.  My understanding is that he has been taking 5mg  of coumadin daily, if this is correct continue this regimen and repeat inr in one month.

## 2014-10-26 ENCOUNTER — Telehealth: Payer: Self-pay | Admitting: Cardiology

## 2014-10-26 ENCOUNTER — Ambulatory Visit: Payer: Medicare Other | Admitting: Internal Medicine

## 2014-10-26 NOTE — Telephone Encounter (Signed)
Patient st for 6-7 hours yesterday his HR was 130 or more and his BP was hard to read. When he did get a reading, it was 80-90/(he can't remember).  He st that his knees were hurting him badly yesterday and thinks it may have exacerbated this spell. He sent a transmission through last night for [place] to review.  Today, the patient has no complaints and believes he is back in NSR. This morning, his BP was 123/70 and HR 72.  To Dr. Mayford Knife and Dr. Graciela Husbands for review. To [facility] to pull transmission as well. Patient is requesting a call from [facility] about what was seen.

## 2014-10-26 NOTE — Telephone Encounter (Signed)
Did device clinic get the rhythm strip

## 2014-10-26 NOTE — Telephone Encounter (Signed)
New message     Pt states he had a low bp (low 90) and 130 or higher pulse yesterday. Pt did not feel good.  He did send a transmission last night and want someone to look at it and call him

## 2014-10-26 NOTE — Telephone Encounter (Signed)
Follow Up  Pt has not heard from our office. Pt requested to speak w. Rn. Please call back and dsicuss.

## 2014-10-27 NOTE — Telephone Encounter (Signed)
Transmission received. Printed off and given to [employer] for review.

## 2014-10-27 NOTE — Telephone Encounter (Signed)
Pt checks BP & pulse every morning when he awakes. On 10/25/14 his pulse rate was ~136bpm. It measured in the 130s "all day long." Pt experienced malaise throughout day. Pt aware it could have been AF w/ RVR. Device does not show any arrhythmia event on 10/25/14. Pt did go to urgent care---he was advised he might have to go to [facility] hospital for a device check. Pt opted not to go to hospital. He went home, measured BP & pulse again, and was normal. He was feeling better once he got home.   Pt does not have a VT or monitor zone on his device. Pt's AT/AF detection set to >150bpm. VF zone set to >200bpm. If pulse was in 130s, he is now aware his device would not react.   ROV w/ device clinic 10/29/14 @10 :00 to turn on VT monitor only zone to 130bpm.

## 2014-10-29 ENCOUNTER — Ambulatory Visit (INDEPENDENT_AMBULATORY_CARE_PROVIDER_SITE_OTHER): Payer: Medicare Other | Admitting: *Deleted

## 2014-10-29 DIAGNOSIS — I480 Paroxysmal atrial fibrillation: Secondary | ICD-10-CM

## 2014-10-29 DIAGNOSIS — I5022 Chronic systolic (congestive) heart failure: Secondary | ICD-10-CM

## 2014-10-29 DIAGNOSIS — I447 Left bundle-branch block, unspecified: Secondary | ICD-10-CM

## 2014-10-29 DIAGNOSIS — I429 Cardiomyopathy, unspecified: Secondary | ICD-10-CM

## 2014-10-29 DIAGNOSIS — I428 Other cardiomyopathies: Secondary | ICD-10-CM

## 2014-10-29 LAB — CUP PACEART INCLINIC DEVICE CHECK
Battery Voltage: 2.95 V
Brady Statistic AP VS Percent: 0.03 %
Brady Statistic AS VS Percent: 0.01 %
Brady Statistic RA Percent Paced: 97.22 %
Brady Statistic RV Percent Paced: 99.96 %
HighPow Impedance: 171 Ohm
HighPow Impedance: 399 Ohm
HighPow Impedance: 43 Ohm
HighPow Impedance: 54 Ohm
Lead Channel Impedance Value: 1026 Ohm
Lead Channel Impedance Value: 399 Ohm
Lead Channel Impedance Value: 589 Ohm
Lead Channel Pacing Threshold Amplitude: 0.5 V
Lead Channel Pacing Threshold Amplitude: 0.75 V
Lead Channel Pacing Threshold Amplitude: 1.25 V
Lead Channel Pacing Threshold Pulse Width: 0.4 ms
Lead Channel Sensing Intrinsic Amplitude: 0.5 mV
Lead Channel Sensing Intrinsic Amplitude: 11.125 mV
Lead Channel Setting Pacing Amplitude: 2.5 V
Lead Channel Setting Pacing Pulse Width: 0.4 ms
Lead Channel Setting Pacing Pulse Width: 0.4 ms
Lead Channel Setting Sensing Sensitivity: 0.3 mV
MDC IDC MSMT LEADCHNL LV IMPEDANCE VALUE: 646 Ohm
MDC IDC MSMT LEADCHNL RA PACING THRESHOLD PULSEWIDTH: 0.4 ms
MDC IDC MSMT LEADCHNL RA SENSING INTR AMPL: 0.5 mV
MDC IDC MSMT LEADCHNL RV IMPEDANCE VALUE: 456 Ohm
MDC IDC MSMT LEADCHNL RV PACING THRESHOLD PULSEWIDTH: 0.4 ms
MDC IDC MSMT LEADCHNL RV SENSING INTR AMPL: 11.125 mV
MDC IDC SESS DTM: 20160506143907
MDC IDC SET LEADCHNL LV PACING AMPLITUDE: 2.25 V
MDC IDC SET LEADCHNL RA PACING AMPLITUDE: 2 V
MDC IDC SET ZONE DETECTION INTERVAL: 300 ms
MDC IDC SET ZONE DETECTION INTERVAL: 360 ms
MDC IDC STAT BRADY AP VP PERCENT: 97.19 %
MDC IDC STAT BRADY AS VP PERCENT: 2.76 %
Zone Setting Detection Interval: 400 ms
Zone Setting Detection Interval: 450 ms

## 2014-10-29 NOTE — Progress Notes (Signed)
CRT-D check in clinic to lower "VT monitor zone" rate (n/c). Rate lowered from 176 bpm to 133bpm. No other changes made this session. Tests were not performed. Patient to follow up as scheduled.

## 2014-11-03 ENCOUNTER — Ambulatory Visit (INDEPENDENT_AMBULATORY_CARE_PROVIDER_SITE_OTHER): Payer: Medicare Other | Admitting: Sports Medicine

## 2014-11-03 ENCOUNTER — Encounter: Payer: Self-pay | Admitting: Sports Medicine

## 2014-11-03 ENCOUNTER — Ambulatory Visit (INDEPENDENT_AMBULATORY_CARE_PROVIDER_SITE_OTHER): Payer: Medicare Other

## 2014-11-03 VITALS — BP 104/69 | HR 76 | Ht 74.0 in | Wt 218.0 lb

## 2014-11-03 DIAGNOSIS — I7389 Other specified peripheral vascular diseases: Secondary | ICD-10-CM

## 2014-11-03 DIAGNOSIS — M1711 Unilateral primary osteoarthritis, right knee: Secondary | ICD-10-CM

## 2014-11-03 DIAGNOSIS — M170 Bilateral primary osteoarthritis of knee: Secondary | ICD-10-CM

## 2014-11-03 MED ORDER — ACETAMINOPHEN ER 650 MG PO TBCR: 650.0000 mg | EXTENDED_RELEASE_TABLET | Freq: Three times a day (TID) | ORAL | Status: AC | PRN

## 2014-11-03 NOTE — Progress Notes (Signed)
   Subjective:    I'm seeing this patient as a consultation for:  Dr. Ivan Anchors  CC: bilateral knee pain  HPI: [name] [name] is a pleasant 81 year old male, for decades he's had worsening pain in his knees localized at the medial joint line with swelling no mechanical symptoms. This progressed to the point where his knees are weak, and he has difficulty rising from a chair. Symptoms are moderate, progressive.  Past medical history, Surgical history, Family history not pertinant except as noted below, Social history, Allergies, and medications have been entered into the medical record, reviewed, and no changes needed.   Review of Systems: No headache, visual changes, nausea, vomiting, diarrhea, constipation, dizziness, abdominal pain, skin rash, fevers, chills, night sweats, weight loss, swollen lymph nodes, body aches, joint swelling, muscle aches, chest pain, shortness of breath, mood changes, visual or auditory hallucinations.   Objective:   General: Well Developed, well nourished, and in no acute distress.  Neuro/Psych: Alert and oriented x3, extra-ocular muscles intact, able to move all 4 extremities, sensation grossly intact. Skin: Warm and dry, no rashes noted.  Respiratory: Not using accessory muscles, speaking in full sentences, trachea midline.  Cardiovascular: Pulses palpable, no extremity edema. Abdomen: Does not appear distended. Bilateral Knees: Tender to palpation at the medial joint lines with a visible and palpable effusion and fluid wave. ROM normal in flexion and extension and lower leg rotation. Ligaments with solid consistent endpoints including ACL, PCL, LCL, MCL. Negative Mcmurray's and provocative meniscal tests. Non painful patellar compression. Patellar and quadriceps tendons unremarkable. Hamstring and quadriceps strength is normal.  Procedure: Real-time Ultrasound Guided Injection of right knee Device: GE Logiq E  Verbal informed consent obtained.  Time-out  conducted.  Noted no overlying erythema, induration, or other signs of local infection.  Skin prepped in a sterile fashion.  Local anesthesia: Topical Ethyl chloride.  With sterile technique and under real time ultrasound guidance:  Aspirated 30 mL of straw-colored fluid, syringe switched and 2 mL Kenalog 40, 4 mL lidocaine injected easily. Completed without difficulty  Pain immediately resolved suggesting accurate placement of the medication.  Advised to call if fevers/chills, erythema, induration, drainage, or persistent bleeding.  Images permanently stored and available for review in the ultrasound unit.  Impression: Technically successful ultrasound guided injection.  Procedure: Real-time Ultrasound Guided Injection of left knee Device: GE Logiq E  Verbal informed consent obtained.  Time-out conducted.  Noted no overlying erythema, induration, or other signs of local infection.  Skin prepped in a sterile fashion.  Local anesthesia: Topical Ethyl chloride.  With sterile technique and under real time ultrasound guidance:  Aspirated 30 mL of straw-colored fluid, syringe switched and 2 mL Kenalog 40, 4 mL lidocaine injected easily. Completed without difficulty  Pain immediately resolved suggesting accurate placement of the medication.  Advised to call if fevers/chills, erythema, induration, drainage, or persistent bleeding.  Images permanently stored and available for review in the ultrasound unit.  Impression: Technically successful ultrasound guided injection.  Impression and Recommendations:   This case required medical decision making of moderate complexity.

## 2014-11-03 NOTE — Assessment & Plan Note (Signed)
Bilateral aspiration and injection. Arthritis strength Tylenol, unable to use NSAIDs, patient is on warfarin. Bilateral x-rays. A single session of formal physical therapy. Next line return in one month.

## 2014-11-11 ENCOUNTER — Encounter: Payer: Self-pay | Admitting: Rehabilitative and Restorative Service Providers"

## 2014-11-11 ENCOUNTER — Ambulatory Visit (INDEPENDENT_AMBULATORY_CARE_PROVIDER_SITE_OTHER): Payer: Medicare Other | Admitting: Rehabilitative and Restorative Service Providers"

## 2014-11-11 DIAGNOSIS — M25562 Pain in left knee: Secondary | ICD-10-CM | POA: Diagnosis not present

## 2014-11-11 DIAGNOSIS — M25561 Pain in right knee: Secondary | ICD-10-CM

## 2014-11-11 DIAGNOSIS — G8929 Other chronic pain: Secondary | ICD-10-CM

## 2014-11-11 NOTE — Therapy (Signed)
[facility] [place] [address] [place], [place], [address] Phone: [phone]   Fax:  [phone]  Physical Therapy Evaluation  Patient Details  Name: [name] [name] MRN: [id number] Date of Birth: [birth year] Referring Provider:  Monica Becton,*  Encounter Date: 11/11/2014      PT End of Session - 11/11/14 1609    Visit Number 1   Number of Visits 1   Date for PT Re-Evaluation --  Eval and treat one session only   PT Start Time 1622   PT Stop Time 1723   PT Time Calculation (min) 61 min   Activity Tolerance Patient tolerated treatment well;No increased pain   Behavior During Therapy [facility] for tasks assessed/performed      Past Medical History  Diagnosis Date  . Nonischemic cardiomyopathy     ef 10 %  . BPH (benign prostatic hypertrophy)   . Hypothyroidism   . Left bundle branch block   . Atrial fibrillation     fibrillation/flutter  . Depression   . Sleep apnea     stopped using CPAP  . Hyperlipidemia   . Thrombocytopenia   . Osteoarthritis   . Acute on chronic systolic heart failure   . Anemia, unspecified 04/07/2013  . Hypertension   . CHF (congestive heart failure)     Nonischemic DCM EF 10% s/p BiV AICD  . CHF (congestive heart failure)     Chronic systolic CHF  . Coronary artery disease 2008    nonobstructive ASCAD    Past Surgical History  Procedure Laterality Date  . Transurethral resection of prostate      for BPH  . Cholecystectomy    . Colonscopy      reportedly negative per pt; around 2005.    . Cardioversion N/A 12/29/2012    Procedure: CARDIOVERSION;  Surgeon: Quintella Reichert, MD;  Location: [facility];  Service: Cardiovascular;  Laterality: N/A;  . Cardiac defibrillator placement      left chest    There were no vitals filed for this visit.  Visit Diagnosis:  Knee pain, chronic, right - Plan: PT plan of care cert/re-cert  Knee pain, chronic, left - Plan: PT plan of care  cert/re-cert      Subjective Assessment - 11/11/14 1626    Subjective Pain in both knees Lt>Rt   Pertinent History Aspiration and injectioin bilateral knees.    Limitations Lifting;Standing;Walking   Patient Stated Goals Patient states that he wants to move in and out of chair sit to stand and stand to sit with greater ease. Would like to start an exercise program - walking.   Currently in Pain? Yes   Pain Score 4    Pain Location Knee   Pain Orientation Left;Right   Pain Descriptors / Indicators Sharp;Spasm   Pain Type Chronic pain   Pain Onset More than a month ago   Pain Frequency Intermittent   Aggravating Factors  Sitting, crossing knees, transitional movements sit to stand and stand to sit   Pain Relieving Factors Injections, walking short distances            [facility] PT Assessment - 11/11/14 0001    Assessment   Medical Diagnosis OA bilateral knees    Onset Date 10/28/14   Next MD Visit 11/29/14   Prior Therapy none   Precautions   Precautions ICD/Pacemaker   Balance Screen   Has the patient fallen in the past 6 months No   Has the patient had  a decrease in activity level because of a fear of falling?  Yes   Is the patient reluctant to leave their home because of a fear of falling?  No   Home Environment   Living Enviornment Private residence   Additional Comments --  No steps into home. one step down to the den   Prior Function   Level of Independence Requires assistive device for independence   Vocation Retired   Leisure --  wood working, some household chores   Observation/Other Assessments   Focus on Therapeutic Outcomes (FOTO)  63% limited   Posture/Postural Control   Posture/Postural Control Postural limitations   Postural Limitations Rounded Shoulders;Forward head;Decreased lumbar lordosis;Increased thoracic kyphosis;Posterior pelvic tilt;Flexed trunk   ROM / Strength   AROM / PROM / Strength AROM;Strength   AROM   AROM Assessment Site Hip;Knee    Right/Left Hip Right;Left   Right Hip Extension --  (-)15   Right Hip Flexion 120   Right Hip External Rotation  15   Right Hip Internal Rotation  5   Right Hip ABduction 20   Left Hip Extension --  (-)15   Left Hip Flexion 115   Left Hip Internal Rotation  5   Left Hip ABduction 15   Right/Left Knee Right;Left   Right Knee Extension -12   Right Knee Flexion 115   Left Knee Extension -15   Left Knee Flexion 115   Strength   Strength Assessment Site Hip;Knee;Ankle   Right/Left Hip Right;Left  hip strength 5/5 grossly   Right Hip External Rotation  --  4-/5   Right/Left Knee Right;Left   Right Knee Flexion 5/5   Right Knee Extension 5/5   Left Knee Flexion 5/5   Left Knee Extension 5/5   Right/Left Ankle Right;Left   Ambulation/Gait   Ambulation/Gait Yes   Ambulation/Gait Assistance 6: Modified independent (Device/Increase time)   Assistive device Straight cane   Gait Pattern Shuffle;Antalgic;Narrow base of support  Forward flexed posture, flexed at hips and trunk                   [facility] Adult PT Treatment/Exercise - 11/11/14 0001    Exercises   Exercises Knee/Hip                PT Education - 11/11/14 1744    Education provided Yes   Person(s) Educated Patient   Methods Explanation;Demonstration;Tactile cues;Verbal cues;Handout   Comprehension Verbalized understanding;Returned demonstration          PT Short Term Goals - 11/11/14 1745    PT SHORT TERM GOAL #1   Title Pt to be instructed in HEP, demonstrating correct technique and verbalizing understanding    Time 1   Period Days   Status Achieved                  Plan - 11/11/14 1757    Clinical Impression Statement Patient presents with bilateral OA knees. He would benefit from a course of Physical Therapy to address ROM restrictions in bilateral hips and knees and improve functional abilities - however patient states that he is unable to afford therapy co-pay  for PT.    Consulted and Agree with Plan of Care Patient         Problem List Patient Active Problem List   Diagnosis Date Noted  . Osteoarthritis of both knees 11/03/2014  . Abnormal PFTs (pulmonary function tests) 04/07/2014  . BPH (benign prostatic hyperplasia) 01/13/2014  . Chronic renal  insufficiency, stage III (moderate) 01/13/2014  . Chronic anticoagulation 04/09/2013  . HTN (hypertension) 04/09/2013  . CAD (coronary artery disease) 04/09/2013  . Anemia, unspecified 04/07/2013  . Chronic systolic heart failure 02/17/2013  . Atrial tachycardia 07/15/202014  . Nonischemic cardiomyopathy   . Left bundle branch block   . Thrombocytopenia   . Biventricular implantable cardioverter-defibrillator-Medtronic 01/10/2011  . DYSPNEA 08/09/2010  . CARDIOMYOPATHY, PRIMARY, DILATED 06/23/2009  . VENTRICULAR TACHYCARDIA 06/23/2009  . HYPOTHYROIDISM 06/22/2009  . Hyperlipidemia 06/22/2009  . DEPRESSION 06/22/2009  . FIBRILLATION, ATRIAL 06/22/2009  . OSA (obstructive sleep apnea) 06/22/2009    Lilyan Prete Rober Minion, PT, MPH 11/11/2014, 6:19 PM  [facility] [address] [place], [place], [address] Phone: [phone]   Fax:  [phone]

## 2014-11-11 NOTE — Patient Instructions (Addendum)
Knee Extension Mobilization: Hang (Prone)   Lying on stomach across the foot of your bed. Body straight squeeze hips together 1-2 times  Try to stay for 5-10 min   Slowly raise buttocks from floor, keeping stomach tight. Repeat __10__ times per set. Do __1-2__ sets per session. Do _2___ sessions per day.  [email or web address].us/1096   Additional exercises provided inc hip extension standing, standing trunk extension facing decking, alternate knee rock supine, knee extension supine with heels on pillow to increase knee extension

## 2014-11-17 ENCOUNTER — Telehealth: Payer: Self-pay | Admitting: Internal Medicine

## 2014-11-17 ENCOUNTER — Ambulatory Visit (INDEPENDENT_AMBULATORY_CARE_PROVIDER_SITE_OTHER): Payer: Medicare Other | Admitting: Internal Medicine

## 2014-11-17 ENCOUNTER — Encounter: Payer: Self-pay | Admitting: Internal Medicine

## 2014-11-17 VITALS — BP 118/76 | HR 74 | Ht 74.0 in | Wt 219.0 lb

## 2014-11-17 DIAGNOSIS — R911 Solitary pulmonary nodule: Secondary | ICD-10-CM | POA: Diagnosis not present

## 2014-11-17 DIAGNOSIS — R0989 Other specified symptoms and signs involving the circulatory and respiratory systems: Secondary | ICD-10-CM | POA: Diagnosis not present

## 2014-11-17 DIAGNOSIS — R942 Abnormal results of pulmonary function studies: Secondary | ICD-10-CM

## 2014-11-17 LAB — PULMONARY FUNCTION TEST
DL/VA % pred: 87 %
DL/VA: 4.21 ml/min/mmHg/L
DLCO UNC: 24.09 ml/min/mmHg
DLCO unc % pred: 63 %
FEF 25-75 POST: 2.05 L/s
FEF 25-75 Pre: 1.74 L/sec
FEF2575-%Change-Post: 17 %
FEF2575-%PRED-POST: 87 %
FEF2575-%Pred-Pre: 74 %
FEV1-%Change-Post: 6 %
FEV1-%PRED-POST: 86 %
FEV1-%Pred-Pre: 81 %
FEV1-POST: 2.93 L
FEV1-PRE: 2.75 L
FEV1FVC-%Change-Post: 6 %
FEV1FVC-%Pred-Pre: 94 %
FEV6-%Change-Post: 0 %
FEV6-%Pred-Post: 92 %
FEV6-%Pred-Pre: 92 %
FEV6-Post: 4.06 L
FEV6-Pre: 4.08 L
FEV6FVC-%Change-Post: 0 %
FEV6FVC-%PRED-PRE: 106 %
FEV6FVC-%Pred-Post: 106 %
FVC-%CHANGE-POST: 0 %
FVC-%Pred-Post: 86 %
FVC-%Pred-Pre: 86 %
FVC-Post: 4.06 L
FVC-Pre: 4.08 L
POST FEV6/FVC RATIO: 100 %
PRE FEV6/FVC RATIO: 100 %
Post FEV1/FVC ratio: 72 %
Pre FEV1/FVC ratio: 67 %
RV % PRED: 101 %
RV: 2.93 L
TLC % PRED: 92 %
TLC: 7.3 L

## 2014-11-17 NOTE — Telephone Encounter (Signed)
I will work him in with our extender to assess volume status

## 2014-11-17 NOTE — Progress Notes (Signed)
Subjective:    Patient ID: [name] [name], male    DOB: Jan 27, 1932, 81 y.o.   MRN: [id number]  HPI   PCP Laren Boom, DO Cards  - Dr Armanda Magic  HPI  IOV  04/07/2014  Chief Complaint  Patient presents with  . Pulmonary Consult    Pt referred by Dr. Armanda Magic for abnormal PFT.    [name] [name] is a 81 y.o. male with a history of NIDCM, nonobstructive ASCAD, chronic CHF, HTN, PAF, OSA and dyslipidemia who presents for followup. He is followed in [facility] for NYHA class II-III CHF He is on CPAP followed by Dr Armanda Magic. He is  On AMio for PAF and is on NSR as of cards visit sept 2015. HE had surveillance PFT while on amio 03/30/14 and showed isolated low dlco 24/63% but with some reductioin in TLC to 79% that suggests possibly restriction. THere is question of amio lung toxicity and therefore referred here. Spirometry is normal  He states he has been amio for 4 years. First few months possibly at 400mg  per day and then 200mg  per day till several weeks or few months ago down to 100mg  per day. He is concerned about its side effects but admits that is efficacious. He has general tiredness but no dyspnea or cough or edema or pnd     ECHO aug 2015  - LVEF 25%, PASP elevated at , RV dilated  Imaging hx   CT chest 2008 - benign nodules but no infilrate (has evidence of pulm htn)  CXR 2014  - acute pulm edema; no followup after that  CBC   hgb 13.5gm% hgb 12/23/13  Walk test 04/07/2014  - 185 feet x 3 laps: no desaturation      OV 11/17/2014  Chief Complaint  Patient presents with  . Follow-up    Pt here after PFT. Pt stated his breathing overall is unchanged. Pt c/o fatigue. Pt denies cough and CP/tightness.    Follow-up for potential amiodarone lung toxicity with finding of isolated reduction in diffusion capacity to 63% in October 2015  He continues to be asymptomatic from a respiratory standpoint. He continues to have general fatigue. He  continues to have gentle balance issues and walks with a cane. None of this is changed for the worse off for the better. Everything is stable. He did have a CT scan of the chest high resolution October 2015 that did not show any evidence of interstitial lung disease so no evidence of amiodarone lung toxicity but he did have new findings of lung nodules 4 mm as described below [I personally review this film today]. He presents for follow-up right now and there are no new issues. He did have repeat pulmonary function test today and his PFTs are essentially unchanged with a DLCO of 63%. I personally reviewed the image is well  Interestingly on exam finding and hearing crackles for the first time  Walking desaturateion test 185 feet x 3 laps slow on RA on 11/17/2014: 100% on RA throughout without desats  CT chest 04/13/14 - personally visualized image 11/17/2014  A) 1. No findings to suggest underlying interstitial lung disease.  So NO evidence of amio lung toxicity B) 3. Two tiny pulmonary nodules in the right lower lobe measuring 3  and 4 mm on images 39 and 40 of series 5 respectively. These are  highly nonspecific. If the patient is at high risk for bronchogenic  carcinoma, follow-up chest CT at  1 year is recommended. So, please order it       Current outpatient prescriptions:  .  acetaminophen (TYLENOL) 650 MG CR tablet, Take 1 tablet (650 mg total) by mouth every 8 (eight) hours as needed for pain., Disp: 90 tablet, Rfl: 3 .  amiodarone (PACERONE) 200 MG tablet, Take 0.5 tablets (100 mg total) by mouth daily., Disp: 15 tablet, Rfl: 6 .  carvedilol (COREG) 3.125 MG tablet, Take 1 tablet (3.125 mg total) by mouth 2 (two) times daily., Disp: 180 tablet, Rfl: 3 .  digoxin (LANOXIN) 0.125 MG tablet, Take 1 tablet (0.125 mg total) by mouth every other day., Disp: 30 tablet, Rfl: 11 .  levothyroxine (SYNTHROID, LEVOTHROID) 125 MCG tablet, Take 1 tablet (125 mcg total) by mouth daily before  breakfast., Disp: 90 tablet, Rfl: 1 .  ramipril (ALTACE) 2.5 MG capsule, Take 1 capsule (2.5 mg total) by mouth daily., Disp: 90 capsule, Rfl: 1 .  simvastatin (ZOCOR) 10 MG tablet, Take 1 tablet (10 mg total) by mouth every evening., Disp: 90 tablet, Rfl: 6 .  spironolactone (ALDACTONE) 25 MG tablet, Take 0.5 tablets (12.5 mg total) by mouth daily., Disp: 45 tablet, Rfl: 6 .  warfarin (COUMADIN) 5 MG tablet, Take 1 tablet (5 mg total) by mouth daily at 6 PM., Disp: 30 tablet, Rfl: 5 .  potassium chloride SA (K-DUR,KLOR-CON) 20 MEQ tablet, Take 20 mEq by mouth as needed. When you take Torsemide, Disp: , Rfl:  .  torsemide (DEMADEX) 20 MG tablet, Take 20 mg by mouth as needed. As needed for weight 215 or greater. Take 20 mg, Disp: , Rfl:     Review of Systems  Constitutional: Negative for fever and unexpected weight change.  HENT: Negative for congestion, dental problem, ear pain, nosebleeds, postnasal drip, rhinorrhea, sinus pressure, sneezing, sore throat and trouble swallowing.   Eyes: Negative for redness and itching.  Respiratory: Positive for shortness of breath. Negative for cough, chest tightness and wheezing.   Cardiovascular: Negative for palpitations and leg swelling.  Gastrointestinal: Negative for nausea and vomiting.  Genitourinary: Negative for dysuria.  Musculoskeletal: Negative for joint swelling.  Skin: Negative for rash.  Neurological: Negative for headaches.  Hematological: Does not bruise/bleed easily.  Psychiatric/Behavioral: Negative for dysphoric mood. The patient is not nervous/anxious.        Objective:   Physical Exam  Constitutional: He is oriented to person, place, and time. No distress.  Looks deconditioned like before  HENT:  Head: Normocephalic and atraumatic.  Right Ear: External ear normal.  Left Ear: External ear normal.  Mouth/Throat: Oropharynx is clear and moist. No oropharyngeal exudate.  Eyes: Conjunctivae and EOM are normal. Pupils are equal,  round, and reactive to light. Right eye exhibits no discharge. Left eye exhibits no discharge. No scleral icterus.  Neck: Normal range of motion. Neck supple. No JVD present. No tracheal deviation present. No thyromegaly present.  Cardiovascular: Normal rate, regular rhythm and intact distal pulses.  Exam reveals no gallop and no friction rub.   No murmur heard. Pulmonary/Chest: Effort normal. No respiratory distress. He has no wheezes. He has rales. He exhibits no tenderness.  Left basal crackles +  > Right basal crackles Crackles appear new  Abdominal: Soft. Bowel sounds are normal. He exhibits no distension and no mass. There is no tenderness. There is no rebound and no guarding.  Musculoskeletal: Normal range of motion. He exhibits no edema or tenderness.  Uses cane due to knee arthritis and balance issues  Lymphadenopathy:  He has no cervical adenopathy.  Neurological: He is alert and oriented to person, place, and time. He has normal reflexes. No cranial nerve deficit. Coordination normal.  Skin: Skin is warm and dry. No rash noted. He is not diaphoretic. No erythema. No pallor.  Psychiatric: He has a normal mood and affect. His behavior is normal. Judgment and thought content normal.  Nursing note and vitals reviewed.   Filed Vitals:   11/17/14 1124  BP: 118/76  Pulse: 74  Height:  (1.88 m)  Weight: 219 lb (99.338 kg)  SpO2: 96%         Assessment & Plan:     ICD-9-CM ICD-10-CM   1. Abnormal PFTs (pulmonary function tests) 794.2 R94.2   2. Lung crackles 786.7 R09.89   3. Lung nodule 793.11 R91.1    #abnormal PFT : Baseed on history and pft test and scans - no evidecne of amio lung toxicity. Low diffusion probably related to heart issues  #You do have new crackles on exam - can and likely related to heart  - will let Dr Mayford Knife know so maybe she needs to adjust your diuresis  #lung nodule: Followup  - OCt 2016 with CT chest wo cotnrast repeat - 1 year for  followup of 4mm nodule     Dr. Kalman Shan, M.D., [facility].C.P Pulmonary and Critical Care Medicine Staff Physician [facility] [place] [facility] Pager: [phone], If no answer or between  15:00h - 7:00h: call [phone]  11/17/2014 9:23 PM

## 2014-11-17 NOTE — Telephone Encounter (Signed)
Hi Traci  I saw him in office today and he had new crackles but  CT chest oct 2015 wsa clear and PFT 11/17/2014 was unchagned from oct. So, I do not think this is amio tox but the crackles are new since oct 2015. ? Needs more diuresis. Not sure why crackles  Thanks Dr. Kalman Shan, M.D., [facility].C.P Pulmonary and Critical Care Medicine Staff Physician [facility] [place] [facility] Pager: [phone], If no answer or between  15:00h - 7:00h: call [phone]  11/17/2014 11:47 AM

## 2014-11-17 NOTE — Patient Instructions (Addendum)
ICD-9-CM ICD-10-CM   1. Abnormal PFTs (pulmonary function tests) 794.2 R94.2   2. Lung crackles 786.7 R09.89   3. Lung nodule 793.11 R91.1     Baseed on history and pft test and scans - no evidecne of amio lung toxicity Low diffusion probably related to heart issues You do have new crackles on exam - can and likely related to heart  - will let Dr Mayford Knife know so maybe she needs to adjust your diuresis  Followup  - OCt 2016 with CT chest wo cotnrast repeat - 1 year for followup of 29mm nodule

## 2014-11-17 NOTE — Progress Notes (Signed)
PFT done today. 

## 2014-11-19 ENCOUNTER — Ambulatory Visit (INDEPENDENT_AMBULATORY_CARE_PROVIDER_SITE_OTHER): Payer: Medicare Other | Admitting: Family Medicine

## 2014-11-19 ENCOUNTER — Telehealth: Payer: Self-pay | Admitting: *Deleted

## 2014-11-19 VITALS — BP 105/66 | HR 78

## 2014-11-19 DIAGNOSIS — I480 Paroxysmal atrial fibrillation: Secondary | ICD-10-CM

## 2014-11-19 LAB — POCT INR: INR: 2.3

## 2014-11-19 NOTE — Telephone Encounter (Signed)
-----   Message from [place], [place] sent at 11/19/2014  4:59 PM EDT ----- Sue Lush, will you please let patient know INR was at goal.  My understanding is that he has been taking 5mg  of coumadin daily, if this is correct continue this regimen and repeat inr in one month.

## 2014-11-19 NOTE — Progress Notes (Signed)
Andrea, will you please let patient know INR was at goal.  My understanding is that he has been taking 5mg of coumadin daily, if this is correct continue this regimen and repeat inr in one month. 

## 2014-11-19 NOTE — Telephone Encounter (Signed)
Pt.notified

## 2014-11-19 NOTE — Telephone Encounter (Signed)
Confirmed appointment for patient on 6/1 at 0830 with Tereso Newcomer. Patient agrees with treatment plan.

## 2014-11-23 NOTE — Progress Notes (Signed)
Cardiology Office Note   Date:  11/24/2014   ID:  [name] [name], DOB January 09, 1932, MRN [id number]  PCP:  [name] Boom, DO  Cardiologist:  Dr. Armanda Magic   Electrophysiologist:  Dr. Sherryl [name]   Chief Complaint  Patient presents with  . Congestive Heart Failure     History of Present Illness: [name] [name] is a 81 y.o. male with a hx of non-obs CAD by LHC in 2008, NICM, systolic HF, s/p CRT-D, LBBB, CKD, HTN, PAF, OSA, HL.  He had abnormal PFTs last year with reduced DLCO.  High resolution CT was negative for ILD.  He is followed by Pulmonology (Dr. Marchelle [name]).  Last seen by Dr. Armanda Magic 09/02/14.  Patient recently saw Dr. Marchelle [name] in FU.  PFTs were unchanged. His lung exam was abnormal and he is referred back for FU on CHF.    The patient has followed-up with the heart failure clinic in the past. He has specific instructions on when to take torsemide. His weights have remained stable for the last 2 years. He has not taken torsemide since that time. He denies any significant increase in his dyspnea on exertion. He is not that active. Overall, he is NYHA 2-2b. He denies chest pain. He denies orthopnea, PND or edema. He denies syncope or ICD shock. He does note an episode several weeks ago where his heart rate was in the 130s on his blood pressure monitor. His device was interrogated. No arrhythmias were detected. Notes indicate that he does not have a VT or monitors on his device. He was brought back to lower his VT monitor zone from 176-133.   Studies/Reports Reviewed Today:  Echo 07/28/14 - mild LVH. EF 35% to 40%. Abnormal GLPSS at -12%, global findings. Diffuse HK - Aortic valve: Structurally normal valve. Trileaflet. There was mild regurgitation. - Mitral valve: Structurally normal valve. Moderate functional central regurgitation. - Left atrium: Severely dilated at 69 ml/m2. - Right ventricle: The cavity size was mildly dilated. Pacer wireor catheter noted in right  ventricle. Systolic function is reduced. Lateral annulus peak S velocity: 8.8 cm/s. - Right atrium: The atrium was mildly dilated. Pacer wire or catheter noted in right atrium. - Tricuspid valve: There was mild regurgitation. - PA peak pressure: 29 mm Hg (S). Impressions:   Compared to the prior echo in 2014, the EF has improved to 35-40%.  LHC 7/08 LM:  Ok LAD:  Luminal irregs RI:  Luminal irregs. LCx:  20-30% RCA:  Luminal irregs EF 10-20%  Past Medical History  Diagnosis Date  . Nonischemic cardiomyopathy     ef 10 %  . BPH (benign prostatic hypertrophy)   . Hypothyroidism   . Left bundle branch block   . Atrial fibrillation     fibrillation/flutter  . Depression   . Sleep apnea     stopped using CPAP  . Hyperlipidemia   . Thrombocytopenia   . Osteoarthritis   . Acute on chronic systolic heart failure   . Anemia, unspecified 04/07/2013  . Hypertension   . CHF (congestive heart failure)     Nonischemic DCM EF 10% s/p BiV AICD  . CHF (congestive heart failure)     Chronic systolic CHF  . Coronary artery disease 2008    nonobstructive ASCAD    Past Surgical History  Procedure Laterality Date  . Transurethral resection of prostate      for BPH  . Cholecystectomy    . Colonscopy      reportedly  negative per pt; around 2005.    . Cardioversion N/A 12/29/2012    Procedure: CARDIOVERSION;  Surgeon: Quintella [name] [name];  Location: [facility];  Service: Cardiovascular;  Laterality: N/A;  . Cardiac defibrillator placement      left chest     Current Outpatient Prescriptions  Medication Sig Dispense Refill  . acetaminophen (TYLENOL) 650 MG CR tablet Take 1 tablet (650 mg total) by mouth every 8 (eight) hours as needed for pain. 90 tablet 3  . amiodarone (PACERONE) 200 MG tablet Take 0.5 tablets (100 mg total) by mouth daily. 15 tablet 6  . carvedilol (COREG) 3.125 MG tablet Take 1 tablet (3.125 mg total) by mouth 2 (two) times daily. 180 tablet 3  . digoxin  (LANOXIN) 0.125 MG tablet Take 1 tablet (0.125 mg total) by mouth every other day. 30 tablet 11  . levothyroxine (SYNTHROID, LEVOTHROID) 125 MCG tablet Take 1 tablet (125 mcg total) by mouth daily before breakfast. 90 tablet 1  . potassium chloride SA (K-DUR,KLOR-CON) 20 MEQ tablet Take 20 mEq by mouth as needed. When you take Torsemide    . ramipril (ALTACE) 2.5 MG capsule Take 1 capsule (2.5 mg total) by mouth daily. 90 capsule 1  . simvastatin (ZOCOR) 10 MG tablet Take 1 tablet (10 mg total) by mouth every evening. 90 tablet 6  . spironolactone (ALDACTONE) 25 MG tablet Take 0.5 tablets (12.5 mg total) by mouth daily. 45 tablet 6  . torsemide (DEMADEX) 20 MG tablet Take 20 mg by mouth as needed. As needed for weight 215 or greater. Take 20 mg    . warfarin (COUMADIN) 5 MG tablet Take 1 tablet (5 mg total) by mouth daily at 6 PM. 30 tablet 5   No current facility-administered medications for this visit.    Allergies:   Review of patient's allergies indicates no known allergies.    Social History:  The patient  reports that he has never smoked. He has never used smokeless tobacco. He reports that he does not drink alcohol or use illicit drugs.   Family History:  The patient's family history includes Cancer in his maternal uncle; Heart disease in his father; Heart failure in his mother; Stroke in his mother. There is no history of Heart attack.    ROS:   Please see the history of present illness.   Review of Systems  Hematologic/Lymphatic: Bruises/bleeds easily.  Musculoskeletal: Positive for myalgias.  All other systems reviewed and are negative.     PHYSICAL EXAM: VS:  BP 98/60 mmHg  Pulse 71  Ht  (1.88 m)  Wt 214 lb (97.07 kg)  BMI 27.46 kg/m2    Wt Readings from Last 3 Encounters:  11/24/14 214 lb (97.07 kg)  11/17/14 219 lb (99.338 kg)  11/03/14 218 lb (98.884 kg)     GEN: Well nourished, well developed, in no acute distress HEENT: normal Neck: no JVD, no  masses Cardiac:  Normal S1/S2, RRR; no murmur ,  no rubs or gallops, no edema  Respiratory:  clear to auscultation bilaterally, no wheezing, rhonchi or rales. GI: soft, nontender, nondistended, + BS MS: no deformity or atrophy Skin: warm and dry  Neuro:  CNs II-XII intact, Strength and sensation are intact Psych: Normal affect   EKG:  EKG is ordered today.  It demonstrates:   V paced, HR 71   Recent Labs: 12/23/2013: Hemoglobin 13.5; Platelets 154 07/06/2014: ALT 18 07/28/2014: BUN 20; Creatinine 1.46*; Potassium 4.5; Sodium 140; TSH 1.928  Lipid Panel    Component Value Date/Time   CHOL 134 07/06/2014 1034   TRIG 76 07/06/2014 1034   HDL 44 07/06/2014 1034   LDLCALC 75 07/06/2014 1034      ASSESSMENT AND PLAN:  Chronic systolic heart failure:  Overall, his volume appears to be stable. His weights have not gone above his threshold to take torsemide in about 2 years. Lung exam is clear. OptivOl was checked today. This demonstrates normal fluid status.  Continue current therapy.  Nonischemic cardiomyopathy:  Continue beta blocker, digoxin, ACE inhibitor, spironolactone.  Paroxysmal atrial fibrillation:  Continue Coumadin.  Essential hypertension:  Controlled.  Hyperlipidemia: Continue statin.  Coronary artery disease involving native coronary artery of native heart without angina pectoris: Nonobstructive disease by prior cardiac catheterization. He is not on aspirin as he is on Coumadin. Continue statin.  Biventricular implantable cardioverter-defibrillator-Medtronic:  Device interrogated today. As outlined above his OptivOl demonstrates no significant increase in volume.   Current medicines are reviewed at length with the patient today.  Concerns regarding medicines are as outlined above.  The following changes have been made:    None    Labs/ tests ordered today include:  Orders Placed This Encounter  Procedures  . EKG 12-Lead    Disposition:   FU with Dr. Armanda Magic as planned.    Signed, Brynda Rim, MHS 11/24/2014 4:20 PM    [facility] [address] [place], [place], [place]  [address] Phone: [phone]; Fax: [phone]

## 2014-11-24 ENCOUNTER — Ambulatory Visit (INDEPENDENT_AMBULATORY_CARE_PROVIDER_SITE_OTHER): Payer: Medicare Other | Admitting: Physician Assistant

## 2014-11-24 ENCOUNTER — Encounter: Payer: Self-pay | Admitting: Physician Assistant

## 2014-11-24 VITALS — BP 98/60 | HR 71 | Ht 74.0 in | Wt 214.0 lb

## 2014-11-24 DIAGNOSIS — I429 Cardiomyopathy, unspecified: Secondary | ICD-10-CM | POA: Diagnosis not present

## 2014-11-24 DIAGNOSIS — I10 Essential (primary) hypertension: Secondary | ICD-10-CM | POA: Diagnosis not present

## 2014-11-24 DIAGNOSIS — I2510 Atherosclerotic heart disease of native coronary artery without angina pectoris: Secondary | ICD-10-CM

## 2014-11-24 DIAGNOSIS — Z95810 Presence of automatic (implantable) cardiac defibrillator: Secondary | ICD-10-CM

## 2014-11-24 DIAGNOSIS — I480 Paroxysmal atrial fibrillation: Secondary | ICD-10-CM

## 2014-11-24 DIAGNOSIS — I5022 Chronic systolic (congestive) heart failure: Secondary | ICD-10-CM | POA: Diagnosis not present

## 2014-11-24 DIAGNOSIS — E785 Hyperlipidemia, unspecified: Secondary | ICD-10-CM

## 2014-11-24 DIAGNOSIS — I428 Other cardiomyopathies: Secondary | ICD-10-CM

## 2014-11-24 NOTE — Patient Instructions (Signed)
Medication Instructions:  Your physician recommends that you continue on your current medications as directed. Please refer to the Current Medication list given to you today.   Labwork: NONE  Testing/Procedures: NONE  Follow-Up: YOU HAVE A FOLLOW UP WITH DR. Mayford Knife 03/01/15 @ 10:30   Any Other Special Instructions Will Be Listed Below (If Applicable).

## 2014-11-29 ENCOUNTER — Encounter: Payer: Self-pay | Admitting: Internal Medicine

## 2014-11-29 ENCOUNTER — Ambulatory Visit (INDEPENDENT_AMBULATORY_CARE_PROVIDER_SITE_OTHER): Payer: Medicare Other | Admitting: *Deleted

## 2014-11-29 DIAGNOSIS — I429 Cardiomyopathy, unspecified: Secondary | ICD-10-CM

## 2014-11-29 DIAGNOSIS — I5022 Chronic systolic (congestive) heart failure: Secondary | ICD-10-CM | POA: Diagnosis not present

## 2014-11-29 DIAGNOSIS — I428 Other cardiomyopathies: Secondary | ICD-10-CM

## 2014-11-30 NOTE — Progress Notes (Signed)
Remote ICD transmission.   

## 2014-12-01 ENCOUNTER — Encounter: Payer: Self-pay | Admitting: Sports Medicine

## 2014-12-01 ENCOUNTER — Ambulatory Visit (INDEPENDENT_AMBULATORY_CARE_PROVIDER_SITE_OTHER): Payer: Medicare Other | Admitting: Sports Medicine

## 2014-12-01 VITALS — BP 104/64 | HR 72 | Ht 73.0 in | Wt 217.0 lb

## 2014-12-01 DIAGNOSIS — M170 Bilateral primary osteoarthritis of knee: Secondary | ICD-10-CM

## 2014-12-01 NOTE — Assessment & Plan Note (Addendum)
Moderate response to bilateral steroid-induced injection after aspiration of the last visit, 50% better. We are going to start Orthovisc today. Return in one week for Orthovisc injection #2 of 4 into both knees. He has been inconsistent with his home physical therapy. He does have severe congestive heart failure with an ejection fraction of less than 30%, he is euvolemic today, and he does have the cardiac capacity for total knee arthroplasty however he is not interested in discussing that at this time. Should his effusions return within 6 months I can drain his knees as often as needed. I'm going to try to get him set up for home health physical therapy.

## 2014-12-01 NOTE — Progress Notes (Signed)
  Subjective:    CC: Follow-up  HPI: Bilateral knee osteoarthritis: Moderate, persistent, 50% improved after aspiration and injection the last visit. He has not done any rehabilitation, he does have a recurrence of effusion. He declines the mention of any type of surgery. He does have a history of heart failure, severe, with an ICD placement. Pain is moderate, persistent without radiation. No mechanical symptoms.  Past medical history, Surgical history, Family history not pertinant except as noted below, Social history, Allergies, and medications have been entered into the medical record, reviewed, and no changes needed.   Review of Systems: No fevers, chills, night sweats, weight loss, chest pain, or shortness of breath.   Objective:    General: Well Developed, well nourished, and in no acute distress.  Neuro: Alert and oriented x3, extra-ocular muscles intact, sensation grossly intact.  HEENT: Normocephalic, atraumatic, pupils equal round reactive to light, neck supple, no masses, no lymphadenopathy, thyroid nonpalpable.  Skin: Warm and dry, no rashes. Cardiac: Regular rate and rhythm, no murmurs rubs or gallops, no lower extremity edema.  Respiratory: Clear to auscultation bilaterally. Not using accessory muscles, speaking in full sentences. Bilateral Knee: Moderate effusion bilaterally with a fluid wave and tenderness at the joint lines ROM normal in flexion and extension and lower leg rotation. Ligaments with solid consistent endpoints including ACL, PCL, LCL, MCL. Negative Mcmurray's and provocative meniscal tests. Non painful patellar compression. Patellar and quadriceps tendons unremarkable. Hamstring and quadriceps strength is normal.  Procedure: Real-time Ultrasound Guided Injection of left knee Device: GE Logiq E  Verbal informed consent obtained.  Time-out conducted.  Noted no overlying erythema, induration, or other signs of local infection.  Skin prepped in a sterile  fashion.  Local anesthesia: Topical Ethyl chloride.  With sterile technique and under real time ultrasound guidance:  Aspirated 20 mL straw-colored fluid, syringe switched and 1 mL kenalog 40, 2 mL lidocaine injected easily, syringe again switched and 30 mg/2 mL of OrthoVisc (sodium hyaluronate) in a prefilled syringe was injected easily into the knee through a 22-gauge needle. Completed without difficulty  Pain immediately resolved suggesting accurate placement of the medication.  Advised to call if fevers/chills, erythema, induration, drainage, or persistent bleeding.  Images permanently stored and available for review in the ultrasound unit.  Impression: Technically successful ultrasound guided injection.  Procedure: Real-time Ultrasound Guided Injection of right knee Device: GE Logiq E  Verbal informed consent obtained.  Time-out conducted.  Noted no overlying erythema, induration, or other signs of local infection.  Skin prepped in a sterile fashion.  Local anesthesia: Topical Ethyl chloride.  With sterile technique and under real time ultrasound guidance:  Aspirated 20 mL straw-colored fluid, syringe switched and 1 mL kenalog 40, 2 mL lidocaine injected easily, syringe again switched and 30 mg/2 mL of OrthoVisc (sodium hyaluronate) in a prefilled syringe was injected easily into the knee through a 22-gauge needle. Completed without difficulty  Pain immediately resolved suggesting accurate placement of the medication.  Advised to call if fevers/chills, erythema, induration, drainage, or persistent bleeding.  Images permanently stored and available for review in the ultrasound unit.  Impression: Technically successful ultrasound guided injection.  Impression and Recommendations:

## 2014-12-02 LAB — CUP PACEART REMOTE DEVICE CHECK
Brady Statistic AP VP Percent: 18.25 %
Brady Statistic AS VP Percent: 81.39 %
Brady Statistic AS VS Percent: 0.35 %
Date Time Interrogation Session: 20160606041712
HighPow Impedance: 380 Ohm
HighPow Impedance: 42 Ohm
HighPow Impedance: 56 Ohm
Lead Channel Impedance Value: 399 Ohm
Lead Channel Impedance Value: 646 Ohm
Lead Channel Pacing Threshold Amplitude: 0.5 V
Lead Channel Pacing Threshold Amplitude: 0.75 V
Lead Channel Pacing Threshold Pulse Width: 0.4 ms
Lead Channel Pacing Threshold Pulse Width: 0.4 ms
Lead Channel Setting Pacing Amplitude: 2.25 V
Lead Channel Setting Pacing Pulse Width: 0.4 ms
Lead Channel Setting Pacing Pulse Width: 0.4 ms
Lead Channel Setting Sensing Sensitivity: 0.3 mV
MDC IDC MSMT BATTERY VOLTAGE: 2.93 V
MDC IDC MSMT LEADCHNL LV IMPEDANCE VALUE: 1026 Ohm
MDC IDC MSMT LEADCHNL LV IMPEDANCE VALUE: 589 Ohm
MDC IDC MSMT LEADCHNL LV PACING THRESHOLD AMPLITUDE: 1.25 V
MDC IDC MSMT LEADCHNL RA SENSING INTR AMPL: 0.5 mV
MDC IDC MSMT LEADCHNL RV IMPEDANCE VALUE: 437 Ohm
MDC IDC MSMT LEADCHNL RV PACING THRESHOLD PULSEWIDTH: 0.4 ms
MDC IDC MSMT LEADCHNL RV SENSING INTR AMPL: 11.125 mV
MDC IDC SET LEADCHNL RA PACING AMPLITUDE: 2 V
MDC IDC SET LEADCHNL RV PACING AMPLITUDE: 2.5 V
MDC IDC SET ZONE DETECTION INTERVAL: 450 ms
MDC IDC STAT BRADY AP VS PERCENT: 0.01 %
MDC IDC STAT BRADY RA PERCENT PACED: 18.26 %
MDC IDC STAT BRADY RV PERCENT PACED: 99.64 %
Zone Setting Detection Interval: 300 ms
Zone Setting Detection Interval: 360 ms
Zone Setting Detection Interval: 400 ms

## 2014-12-07 ENCOUNTER — Encounter: Payer: Self-pay | Admitting: Internal Medicine

## 2014-12-09 ENCOUNTER — Encounter: Payer: Self-pay | Admitting: Sports Medicine

## 2014-12-09 ENCOUNTER — Ambulatory Visit (INDEPENDENT_AMBULATORY_CARE_PROVIDER_SITE_OTHER): Payer: Medicare Other | Admitting: Sports Medicine

## 2014-12-09 VITALS — BP 86/54 | HR 74 | Ht 74.0 in | Wt 214.0 lb

## 2014-12-09 DIAGNOSIS — M170 Bilateral primary osteoarthritis of knee: Secondary | ICD-10-CM | POA: Diagnosis not present

## 2014-12-09 NOTE — Assessment & Plan Note (Signed)
Doing okay, not noting a benefit just yet. Orthovisc injection #2 of 4 into both knees.

## 2014-12-09 NOTE — Progress Notes (Signed)

## 2014-12-16 ENCOUNTER — Ambulatory Visit (INDEPENDENT_AMBULATORY_CARE_PROVIDER_SITE_OTHER): Payer: Medicare Other | Admitting: Sports Medicine

## 2014-12-16 VITALS — BP 100/64 | HR 74 | Wt 216.0 lb

## 2014-12-16 DIAGNOSIS — M170 Bilateral primary osteoarthritis of knee: Secondary | ICD-10-CM

## 2014-12-16 NOTE — Assessment & Plan Note (Signed)
Orthovisc injection #3 into both knees, return in one week for #4, starting to feel a response.

## 2014-12-16 NOTE — Progress Notes (Signed)
  Procedure: Real-time Ultrasound Guided Injection of left knee Device: GE Logiq E  Verbal informed consent obtained.  Time-out conducted.  Noted no overlying erythema, induration, or other signs of local infection.  Skin prepped in a sterile fashion.  Local anesthesia: Topical Ethyl chloride.  With sterile technique and under real time ultrasound guidance:   Aspirated 25 mL of thick, straw-colored fluid, syringe switched and 30 mg/2 mL of OrthoVisc (sodium hyaluronate) in a prefilled syringe was injected easily into the knee through a 22-gauge needle. Completed without difficulty  Pain immediately resolved suggesting accurate placement of the medication.  Advised to call if fevers/chills, erythema, induration, drainage, or persistent bleeding.  Images permanently stored and available for review in the ultrasound unit.  Impression: Technically successful ultrasound guided injection.  Procedure: Real-time Ultrasound Guided Injection of right knee Device: GE Logiq E  Verbal informed consent obtained.  Time-out conducted.  Noted no overlying erythema, induration, or other signs of local infection.  Skin prepped in a sterile fashion.  Local anesthesia: Topical Ethyl chloride.  With sterile technique and under real time ultrasound guidance:   30 mg/2 mL of OrthoVisc (sodium hyaluronate) in a prefilled syringe was injected easily into the knee through a 22-gauge needle. Completed without difficulty  Pain immediately resolved suggesting accurate placement of the medication.  Advised to call if fevers/chills, erythema, induration, drainage, or persistent bleeding.  Images permanently stored and available for review in the ultrasound unit.  Impression: Technically successful ultrasound guided injection.

## 2014-12-22 ENCOUNTER — Encounter: Payer: Self-pay | Admitting: Cardiology

## 2014-12-23 ENCOUNTER — Encounter: Payer: Self-pay | Admitting: Sports Medicine

## 2014-12-23 ENCOUNTER — Ambulatory Visit (INDEPENDENT_AMBULATORY_CARE_PROVIDER_SITE_OTHER): Payer: Medicare Other | Admitting: Sports Medicine

## 2014-12-23 DIAGNOSIS — M170 Bilateral primary osteoarthritis of knee: Secondary | ICD-10-CM | POA: Diagnosis not present

## 2014-12-23 NOTE — Progress Notes (Signed)
  Procedure: Real-time Ultrasound Guided Injection of left knee Device: GE Logiq E  Verbal informed consent obtained.  Time-out conducted.  Noted no overlying erythema, induration, or other signs of local infection.  Skin prepped in a sterile fashion.  Local anesthesia: Topical Ethyl chloride.  With sterile technique and under real time ultrasound guidance:   Aspirated 25 mL of thick, straw-colored fluid, syringe switched and 30 mg/2 mL of OrthoVisc (sodium hyaluronate) in a prefilled syringe was injected easily into the knee through a 22-gauge needle. Completed without difficulty  Pain immediately resolved suggesting accurate placement of the medication.  Advised to call if fevers/chills, erythema, induration, drainage, or persistent bleeding.  Images permanently stored and available for review in the ultrasound unit.  Impression: Technically successful ultrasound guided injection.  Procedure: Real-time Ultrasound Guided Injection of right knee Device: GE Logiq E  Verbal informed consent obtained.  Time-out conducted.  Noted no overlying erythema, induration, or other signs of local infection.  Skin prepped in a sterile fashion.  Local anesthesia: Topical Ethyl chloride.  With sterile technique and under real time ultrasound guidance:   Aspirated 11 cc straw colored fluids 30 mg/2 mL of OrthoVisc (sodium hyaluronate) in a prefilled syringe was injected easily into the knee through a 22-gauge needle. Completed without difficulty  Pain immediately resolved suggesting accurate placement of the medication.  Advised to call if fevers/chills, erythema, induration, drainage, or persistent bleeding.  Images permanently stored and available for review in the ultrasound unit.  Impression: Technically successful ultrasound guided injection.

## 2014-12-23 NOTE — Assessment & Plan Note (Signed)
Orthovisc No. 4 of 4 into each knee. Overall doing very well. Return in 4 weeks as needed. They are homebound and I would like to set them up with home health physical therapy.

## 2014-12-29 ENCOUNTER — Telehealth: Payer: Self-pay | Admitting: Cardiology

## 2014-12-29 NOTE — Telephone Encounter (Signed)
-----   Message from Danton Clap sent at 12/29/2014  2:16 PM EDT ----- Pt left message on my voicemail re  a question about the medtronic part we ordered for him.  [phone]  Thanks, Efraim Kaufmann

## 2014-12-29 NOTE — Telephone Encounter (Signed)
Spoke w/ pt and informed him that he can send a trail remote transmission today since he just received his wirex adapter. Pt stated that he will send one tomorrow.

## 2015-01-17 ENCOUNTER — Other Ambulatory Visit: Payer: Self-pay | Admitting: Family Medicine

## 2015-01-17 MED ORDER — WARFARIN SODIUM 5 MG PO TABS: 5.0000 mg | ORAL_TABLET | Freq: Every day | ORAL | Status: DC

## 2015-01-21 ENCOUNTER — Ambulatory Visit (INDEPENDENT_AMBULATORY_CARE_PROVIDER_SITE_OTHER): Payer: Medicare Other | Admitting: Family Medicine

## 2015-01-21 ENCOUNTER — Encounter: Payer: Self-pay | Admitting: Family Medicine

## 2015-01-21 VITALS — BP 106/67 | HR 71 | Wt 215.0 lb

## 2015-01-21 DIAGNOSIS — G609 Hereditary and idiopathic neuropathy, unspecified: Secondary | ICD-10-CM | POA: Diagnosis not present

## 2015-01-21 DIAGNOSIS — E039 Hypothyroidism, unspecified: Secondary | ICD-10-CM

## 2015-01-21 DIAGNOSIS — I480 Paroxysmal atrial fibrillation: Secondary | ICD-10-CM

## 2015-01-21 LAB — TSH: TSH: 1.812 u[IU]/mL (ref 0.350–4.500)

## 2015-01-21 MED ORDER — GABAPENTIN 100 MG PO CAPS: 100.0000 mg | ORAL_CAPSULE | Freq: Every evening | ORAL | Status: DC | PRN

## 2015-01-21 NOTE — Progress Notes (Signed)
CC: [name] [name] is a 81 y.o. male is here for f/u INR   Subjective: HPI:   follow-up hypothyroidism:  Continues to take 125 g of levothyroxine on a daily basis. No unintentional weight loss or gain. Denies any skin or hair changes.  Follow-up atrial fibrillation: No outside blood pressures report. Weight has been stable at home without needing furosemide. Denies rapid heartbeat or knowledge of his defibrillator firing. Denies peripheral edema shortness of breath or lightheadedness. Denies bleeding or bruising abnormalities. Currently taking 5 mg of Coumadin daily  Complains of a tingling and burning sensation in his legs mostly noticed when he strained to go to bed at night. Symptoms are mild in severity but significantly interfering his quality of life. Never present when up and active.denies any weakness  Review Of Systems Outlined In HPI  Past Medical History  Diagnosis Date  . Nonischemic cardiomyopathy     ef 10 %  . BPH (benign prostatic hypertrophy)   . Hypothyroidism   . Left bundle branch block   . Atrial fibrillation     fibrillation/flutter  . Depression   . Sleep apnea     stopped using CPAP  . Hyperlipidemia   . Thrombocytopenia   . Osteoarthritis   . Acute on chronic systolic heart failure   . Anemia, unspecified 04/07/2013  . Hypertension   . CHF (congestive heart failure)     Nonischemic DCM EF 10% s/p BiV AICD  . CHF (congestive heart failure)     Chronic systolic CHF  . Coronary artery disease 2008    nonobstructive ASCAD    Past Surgical History  Procedure Laterality Date  . Transurethral resection of prostate      for BPH  . Cholecystectomy    . Colonscopy      reportedly negative per pt; around 2005.    . Cardioversion N/A 12/29/2012    Procedure: CARDIOVERSION;  Surgeon: Quintella Reichert, MD;  Location: [facility];  Service: Cardiovascular;  Laterality: N/A;  . Cardiac defibrillator placement      left chest   Family History  Problem  Relation Age of Onset  . Heart failure Mother   . Heart disease Father   . Cancer Maternal Uncle     Unknown subtype  . Stroke Mother   . Heart attack Neg Hx     History   Social History  . Marital Status: Married    Spouse Name: N/A  . Number of Children: 4  . Years of Education: N/A   Occupational History  . retired     retired Optometrist; now in [employer], [employer]  .     Social History Main Topics  . Smoking status: Never Smoker   . Smokeless tobacco: Never Used  . Alcohol Use: No  . Drug Use: No  . Sexual Activity: No   Other Topics Concern  . Not on file   Social History Narrative     Objective: BP 106/67 mmHg  Pulse 71  Wt 215 lb (97.523 kg)  General: Alert and Oriented, No Acute Distress HEENT: Pupils equal, round, reactive to light. Conjunctivae clear.  Moist mucous membranes pharynx unremarkable Lungs: Clear to auscultation bilaterally, no wheezing/ronchi/rales.  Comfortable work of breathing. Good air movement. Cardiac: Regular rate and rhythm.  Distant S1/S2.  No murmurs, rubs, nor gallops.   Extremities: No peripheral edema.  Strong peripheral pulses.  Mental Status: No depression, anxiety, nor agitation. Skin: Warm and dry.  Assessment & Plan: [name] [name]  was seen today for f/u inr.  Diagnoses and all orders for this visit:  Hypothyroidism, unspecified hypothyroidism type Orders: -     TSH  Paroxysmal atrial fibrillation  Hereditary and idiopathic peripheral neuropathy Orders: -     gabapentin (NEURONTIN) 100 MG capsule; Take 1 capsule (100 mg total) by mouth at bedtime as needed (neuropathy).   Hypothyroidism: Clinically controlled due for TSH, continue 125 g pending results Atrial fibrillation: Currently sounds to be normal sinus rhythm, INR 2.7, controlled, continue 5 mg of warfarin daily repeat one month  Peripheral neuropathy: Possibly due to amiodarone, if so benefits still outweigh this side effect, Trial of  gabapentin    Return in about 3 months (around 04/23/2015).

## 2015-01-24 ENCOUNTER — Telehealth: Payer: Self-pay | Admitting: Family Medicine

## 2015-01-24 MED ORDER — LEVOTHYROXINE SODIUM 125 MCG PO TABS: 125.0000 ug | ORAL_TABLET | Freq: Every day | ORAL | Status: DC

## 2015-01-24 NOTE — Telephone Encounter (Signed)
Pt's spouse notified.

## 2015-01-24 NOTE — Telephone Encounter (Signed)
Sue Lush, Will you please let patient know that his thyroid supplement appears to be adequate, refills were sent to [facility] on [address].

## 2015-02-21 ENCOUNTER — Other Ambulatory Visit: Payer: Self-pay | Admitting: *Deleted

## 2015-02-21 MED ORDER — CARVEDILOL 3.125 MG PO TABS: 3.1250 mg | ORAL_TABLET | Freq: Two times a day (BID) | ORAL | Status: DC

## 2015-02-28 ENCOUNTER — Encounter: Payer: Self-pay | Admitting: Cardiology

## 2015-02-28 NOTE — Progress Notes (Signed)
Cardiology Office Note   Date:  03/01/2015   ID:  [name] [name], DOB 07/10/1931, MRN [id number]  PCP:  Laren Boom, DO    Chief Complaint  Patient presents with  . Nonischemic cardiomyopathy      History of Present Illness: [name] [name] is a 81 y.o. male with a history of nonobstructive ASCAD, NICM by cath 2008 (EF 35-40% by echo 07/2014) with chronic CHF s/p Medtronic CRT-D, LBBB, CRI (baseline 1.7-1.8), HTN, PAF, OSA and dyslipidemia who presents for followup. He is followed in [facility] for NYHA class II-III CHF. He is doing much better. His weight has been maintaining around 212bs at home (290-213lbs at home). He denies any SOB, LE edema, palpitations or syncope. He occasionally will have some dizziness when going from sitting to standing. He walks some for exercise.     Past Medical History  Diagnosis Date  . Nonischemic cardiomyopathy     ef 10 %  . BPH (benign prostatic hypertrophy)   . Hypothyroidism   . Left bundle branch block   . Atrial fibrillation     fibrillation/flutter  . Depression   . Sleep apnea     stopped using CPAP  . Hyperlipidemia   . Thrombocytopenia   . Osteoarthritis   . Anemia, unspecified 04/07/2013  . Hypertension   . Coronary artery disease 2008    nonobstructive ASCAD  . Chronic systolic CHF (congestive heart failure)     Nonischemic DCM EF 10% s/p BiV AICD  . OSA (obstructive sleep apnea)     intolerant to CPAP    Past Surgical History  Procedure Laterality Date  . Transurethral resection of prostate      for BPH  . Cholecystectomy    . Colonscopy      reportedly negative per pt; around 2005.    . Cardioversion N/A 12/29/2012    Procedure: CARDIOVERSION;  Surgeon: Quintella Reichert, MD;  Location: [facility];  Service: Cardiovascular;  Laterality: N/A;  . Cardiac defibrillator placement      left chest     Current Outpatient Prescriptions  Medication Sig Dispense Refill  . acetaminophen  (TYLENOL) 650 MG CR tablet Take 1 tablet (650 mg total) by mouth every 8 (eight) hours as needed for pain. 90 tablet 3  . amiodarone (PACERONE) 200 MG tablet Take 0.5 tablets (100 mg total) by mouth daily. 15 tablet 6  . carvedilol (COREG) 3.125 MG tablet Take 1 tablet (3.125 mg total) by mouth 2 (two) times daily. 60 tablet 0  . digoxin (LANOXIN) 0.125 MG tablet Take 1 tablet (0.125 mg total) by mouth every other day. 30 tablet 11  . gabapentin (NEURONTIN) 100 MG capsule Take 1 capsule (100 mg total) by mouth at bedtime as needed (neuropathy). 90 capsule 0  . levothyroxine (SYNTHROID, LEVOTHROID) 125 MCG tablet Take 1 tablet (125 mcg total) by mouth daily before breakfast. 90 tablet 1  . potassium chloride SA (K-DUR,KLOR-CON) 20 MEQ tablet Take 20 mEq by mouth as needed. When you take Torsemide    . ramipril (ALTACE) 2.5 MG capsule Take 1 capsule (2.5 mg total) by mouth daily. 90 capsule 1  . simvastatin (ZOCOR) 10 MG tablet Take 1 tablet (10 mg total) by mouth every evening. 90 tablet 6  . spironolactone (ALDACTONE) 25 MG tablet Take 0.5 tablets (12.5 mg total) by mouth daily. 45 tablet 6  . torsemide (DEMADEX)  20 MG tablet Take 20 mg by mouth as needed. As needed for weight 215 or greater. Take 20 mg    . warfarin (COUMADIN) 5 MG tablet Take 1 tablet (5 mg total) by mouth daily at 6 PM. 30 tablet 5   No current facility-administered medications for this visit.    Allergies:   Review of patient's allergies indicates no known allergies.    Social History:  The patient  reports that he has never smoked. He has never used smokeless tobacco. He reports that he does not drink alcohol or use illicit drugs.   Family History:  The patient's family history includes Cancer in his maternal uncle; Heart disease in his father; Heart failure in his mother; Stroke in his mother. There is no history of Heart attack.    ROS:  Please see the history of present illness.   Otherwise, review of systems are  positive for none.   All other systems are reviewed and negative.    PHYSICAL EXAM: VS:  BP 110/62 mmHg  Pulse 67  Ht 6\' 4"  (1.93 m)  Wt 218 lb (98.884 kg)  BMI 26.55 kg/m2  SpO2 98% , BMI Body mass index is 26.55 kg/(m^2). GEN: Well nourished, well developed, in no acute distress HEENT: normal Neck: no JVD, carotid bruits, or masses Cardiac: RRR; no murmurs, rubs, or gallops,no edema  Respiratory:  clear to auscultation bilaterally, normal work of breathing GI: soft, nontender, nondistended, + BS MS: no deformity or atrophy Skin: warm and dry, no rash Neuro:  Strength and sensation are intact Psych: euthymic mood, full affect   EKG:  EKG is not ordered today.    Recent Labs: 07/06/2014: ALT 18 07/28/2014: BUN 20; Creatinine, Ser 1.46*; Potassium 4.5; Sodium 140 01/21/2015: TSH 1.812    Lipid Panel    Component Value Date/Time   CHOL 134 07/06/2014 1034   TRIG 76 07/06/2014 1034   HDL 44 07/06/2014 1034   LDLCALC 75 07/06/2014 1034      Wt Readings from Last 3 Encounters:  03/01/15 218 lb (98.884 kg)  01/21/15 215 lb (97.523 kg)  12/23/14 217 lb (98.431 kg)    ASSESSMENT AND PLAN:  1. PAF maintaining NSR on Amiodarone  - continue Amio/Coreg/Warfarin  - he is up to date on blood work and PFTs for Amio. Last PFTs showed reduced DLCO and chest CT showed no evidence of amio lung toxicity per Dr. Marchelle Gearing. 2. Chronic systolic CHF NYHA class II - compensated with stable weight - he will continue to monitor his weights and sodium intake. His weight is maintaining around 210-212lbs with no clothes on. His weight today was 211 at home. - continue Torsemide on as needed basis if weight >215lbs.  He has not used any in over a year - continue every other day digoxin  - continue Aldactone/ACE I/Coreg  - continue daily weights with SS Torsemide coverage 3. Dyslipidemia - near goal with LDL 75 - recheck FLP and ALT - continue simvastatin  4. HTN controlled  - continue  Coreg/Lisinopril 5. OSA he stopped using his CPAP - intolerant to CPAP 6. Nonobstructive ASCAD with no angina - not on ASA since he is on warfarin    Current medicines are reviewed at length with the patient today.  The patient does not have concerns regarding medicines.  The following changes have been made:  no change  Labs/ tests ordered today: See above Assessment and Plan No orders of the defined types were placed in this  encounter.     Disposition:   FU with me in 6 months  Signed, Quintella Reichert, MD  03/01/2015 10:46 AM    [facility] [address] [place], [place], [place]  [address] Phone: [phone]; Fax: [phone]

## 2015-03-01 ENCOUNTER — Ambulatory Visit (INDEPENDENT_AMBULATORY_CARE_PROVIDER_SITE_OTHER): Payer: Medicare Other | Admitting: Cardiology

## 2015-03-01 ENCOUNTER — Ambulatory Visit (INDEPENDENT_AMBULATORY_CARE_PROVIDER_SITE_OTHER): Payer: Medicare Other | Admitting: *Deleted

## 2015-03-01 ENCOUNTER — Encounter: Payer: Self-pay | Admitting: Cardiology

## 2015-03-01 VITALS — BP 110/62 | HR 67 | Ht 76.0 in | Wt 218.0 lb

## 2015-03-01 DIAGNOSIS — I429 Cardiomyopathy, unspecified: Secondary | ICD-10-CM

## 2015-03-01 DIAGNOSIS — I10 Essential (primary) hypertension: Secondary | ICD-10-CM

## 2015-03-01 DIAGNOSIS — E785 Hyperlipidemia, unspecified: Secondary | ICD-10-CM

## 2015-03-01 DIAGNOSIS — I5022 Chronic systolic (congestive) heart failure: Secondary | ICD-10-CM

## 2015-03-01 DIAGNOSIS — I447 Left bundle-branch block, unspecified: Secondary | ICD-10-CM

## 2015-03-01 DIAGNOSIS — I2510 Atherosclerotic heart disease of native coronary artery without angina pectoris: Secondary | ICD-10-CM

## 2015-03-01 DIAGNOSIS — I428 Other cardiomyopathies: Secondary | ICD-10-CM

## 2015-03-01 DIAGNOSIS — I480 Paroxysmal atrial fibrillation: Secondary | ICD-10-CM | POA: Diagnosis not present

## 2015-03-01 DIAGNOSIS — I2583 Coronary atherosclerosis due to lipid rich plaque: Secondary | ICD-10-CM

## 2015-03-01 DIAGNOSIS — G4733 Obstructive sleep apnea (adult) (pediatric): Secondary | ICD-10-CM

## 2015-03-01 NOTE — Patient Instructions (Signed)
Medication Instructions:  Your physician recommends that you continue on your current medications as directed. Please refer to the Current Medication list given to you today.   Labwork: Your physician recommends that you return for FASTING lab work in one week: BMET, LFTs, Lipids   Testing/Procedures: None  Follow-Up: Your physician wants you to follow-up in: 6 months with Dr. Mayford Knife. You will receive a reminder letter in the mail two months in advance. If you don't receive a letter, please call our office to schedule the follow-up appointment.   Any Other Special Instructions Will Be Listed Below (If Applicable).

## 2015-03-02 ENCOUNTER — Ambulatory Visit (INDEPENDENT_AMBULATORY_CARE_PROVIDER_SITE_OTHER): Payer: Medicare Other | Admitting: Family Medicine

## 2015-03-02 VITALS — BP 102/67 | HR 75

## 2015-03-02 DIAGNOSIS — I480 Paroxysmal atrial fibrillation: Secondary | ICD-10-CM

## 2015-03-02 LAB — POCT INR: INR: 2.5

## 2015-03-02 NOTE — Progress Notes (Signed)
Pt.notified

## 2015-03-02 NOTE — Progress Notes (Signed)
Sue Lush, will you please let patient know INR was at goal.  My understanding is that he has been taking 5mg  of coumadin daily, if this is correct continue this regimen and repeat inr in one month.

## 2015-03-04 NOTE — Progress Notes (Signed)
Remote ICD transmission.   

## 2015-03-12 LAB — CUP PACEART REMOTE DEVICE CHECK
Brady Statistic AP VP Percent: 80.9 %
Brady Statistic AS VP Percent: 18.7 %
Brady Statistic AS VS Percent: 0.3 %
HighPow Impedance: 39 Ohm
Lead Channel Impedance Value: 532 Ohm
Lead Channel Pacing Threshold Amplitude: 0.625 V
Lead Channel Pacing Threshold Amplitude: 0.75 V
Lead Channel Pacing Threshold Amplitude: 1.75 V
Lead Channel Pacing Threshold Pulse Width: 0.4 ms
Lead Channel Sensing Intrinsic Amplitude: 9.3 mV
Lead Channel Setting Pacing Amplitude: 2.5 V
Lead Channel Setting Pacing Amplitude: 2.5 V
Lead Channel Setting Pacing Pulse Width: 0.4 ms
Lead Channel Setting Sensing Sensitivity: 0.3 mV
MDC IDC MSMT LEADCHNL RA IMPEDANCE VALUE: 342 Ohm
MDC IDC MSMT LEADCHNL RA PACING THRESHOLD PULSEWIDTH: 0.4 ms
MDC IDC MSMT LEADCHNL RA SENSING INTR AMPL: 0.4 mV
MDC IDC MSMT LEADCHNL RV IMPEDANCE VALUE: 399 Ohm
MDC IDC MSMT LEADCHNL RV PACING THRESHOLD PULSEWIDTH: 0.4 ms
MDC IDC SESS DTM: 20160917151923
MDC IDC SET LEADCHNL LV PACING PULSEWIDTH: 0.4 ms
MDC IDC SET LEADCHNL RA PACING AMPLITUDE: 2 V
MDC IDC SET ZONE DETECTION INTERVAL: 300 ms
MDC IDC SET ZONE DETECTION INTERVAL: 360 ms
MDC IDC SET ZONE DETECTION INTERVAL: 450 ms
MDC IDC STAT BRADY AP VS PERCENT: 0.2 %
Zone Setting Detection Interval: 400 ms

## 2015-03-21 ENCOUNTER — Telehealth: Payer: Self-pay | Admitting: Cardiology

## 2015-03-21 MED ORDER — RAMIPRIL 2.5 MG PO CAPS: 2.5000 mg | ORAL_CAPSULE | Freq: Every day | ORAL | Status: DC

## 2015-03-21 MED ORDER — DIGOXIN 125 MCG PO TABS: 0.1250 mg | ORAL_TABLET | ORAL | Status: DC

## 2015-03-21 MED ORDER — CARVEDILOL 3.125 MG PO TABS: 3.1250 mg | ORAL_TABLET | Freq: Two times a day (BID) | ORAL | Status: DC

## 2015-03-21 MED ORDER — AMIODARONE HCL 200 MG PO TABS: 100.0000 mg | ORAL_TABLET | Freq: Every day | ORAL | Status: DC

## 2015-03-21 NOTE — Telephone Encounter (Signed)
Patient st he is almost out of carvedilol, digoxin, ramipril, and amiodarone. Refills sent to [facility] as requested.

## 2015-03-21 NOTE — Telephone Encounter (Signed)
New Message  Pt calling about medication dosage change. Please call back and discuss.

## 2015-03-24 ENCOUNTER — Other Ambulatory Visit: Payer: Self-pay | Admitting: Internal Medicine

## 2015-03-24 DIAGNOSIS — R911 Solitary pulmonary nodule: Secondary | ICD-10-CM

## 2015-03-30 ENCOUNTER — Ambulatory Visit (HOSPITAL_COMMUNITY)
Admission: RE | Admit: 2015-03-30 | Discharge: 2015-03-30 | Disposition: A | Discharge status: Home / Self Care | Payer: Medicare Other | Source: Ambulatory Visit | Attending: Internal Medicine | Admitting: Internal Medicine

## 2015-03-30 ENCOUNTER — Telehealth: Payer: Self-pay | Admitting: Internal Medicine

## 2015-03-30 DIAGNOSIS — R911 Solitary pulmonary nodule: Secondary | ICD-10-CM | POA: Insufficient documentation

## 2015-03-30 DIAGNOSIS — Z950 Presence of cardiac pacemaker: Secondary | ICD-10-CM | POA: Diagnosis not present

## 2015-03-30 NOTE — Telephone Encounter (Signed)
Patient notified.  No questions or concerns at this time. Nothing further needed.   

## 2015-03-30 NOTE — Telephone Encounter (Signed)
    Dg Chest 2 View  03/30/2015   CLINICAL DATA:  Yearly follow-up of a known lung nodule, nonsmoker, history of cardiac dysrhythmia and permanent pacemaker placement  EXAM: CHEST  2 VIEW  COMPARISON:  CT scan of the chest of April 13, 2014 and PA and lateral chest x-ray of January 22, 2013  FINDINGS: The lungs are mildly hyperinflated. There is no focal infiltrate. There is no pleural effusion. No abnormal nodules or masses are demonstrated. The permanent pacemaker defibrillator is in reasonable position radiographically. The bony thorax exhibits no acute abnormality. There is mild multilevel degenerative disc disease of the thoracic spine.  IMPRESSION: There is no active cardiopulmonary disease.   Electronically Signed   By: David  [place] M.D.   On: 03/30/2015 11:00   Chest x-ray is clear. Please have him keep up his appointment with CT chest for lung nodule later this month

## 2015-03-30 NOTE — Telephone Encounter (Signed)
Called and spoke with pt Pt was inquiring of results of cxr that was done this morning Informed pt that once MR has resulted cxr office would call him back with results Pt voiced understanding  MR, please advise of cxr results. Thanks

## 2015-04-05 ENCOUNTER — Encounter: Payer: Self-pay | Admitting: Cardiology

## 2015-04-13 ENCOUNTER — Ambulatory Visit (INDEPENDENT_AMBULATORY_CARE_PROVIDER_SITE_OTHER): Payer: Medicare Other | Admitting: Family Medicine

## 2015-04-13 VITALS — BP 115/67 | HR 74 | Temp 97.8°F

## 2015-04-13 DIAGNOSIS — I480 Paroxysmal atrial fibrillation: Secondary | ICD-10-CM | POA: Diagnosis not present

## 2015-04-13 DIAGNOSIS — Z23 Encounter for immunization: Secondary | ICD-10-CM

## 2015-04-13 LAB — POCT INR: INR: 2.2

## 2015-04-13 NOTE — Progress Notes (Signed)
Attempted to contact Pt, no answer and no voicemail. Will try again.

## 2015-04-13 NOTE — Progress Notes (Signed)
Andrea, will you please let patient know INR was at goal.  My understanding is that he has been taking 5mg of coumadin daily, if this is correct continue this regimen and repeat inr in one month. 

## 2015-04-13 NOTE — Patient Instructions (Signed)
[name] [name], will you please let patient know INR was at goal.  My understanding is that he has been taking 5mg of coumadin daily, if this is correct continue this regimen and repeat inr in one month. 

## 2015-04-13 NOTE — Progress Notes (Signed)
Pt was advised of dosage continuation and when to follow up. He will contact us to schedule. No further questions.

## 2015-04-14 ENCOUNTER — Encounter: Payer: Self-pay | Admitting: Internal Medicine

## 2015-04-20 ENCOUNTER — Ambulatory Visit (INDEPENDENT_AMBULATORY_CARE_PROVIDER_SITE_OTHER)
Admission: RE | Admit: 2015-04-20 | Discharge: 2015-04-20 | Disposition: A | Discharge status: Home / Self Care | Payer: Medicare Other | Source: Ambulatory Visit | Attending: Internal Medicine | Admitting: Internal Medicine

## 2015-04-20 ENCOUNTER — Other Ambulatory Visit (INDEPENDENT_AMBULATORY_CARE_PROVIDER_SITE_OTHER): Payer: Medicare Other | Admitting: *Deleted

## 2015-04-20 DIAGNOSIS — I5022 Chronic systolic (congestive) heart failure: Secondary | ICD-10-CM

## 2015-04-20 DIAGNOSIS — R911 Solitary pulmonary nodule: Secondary | ICD-10-CM

## 2015-04-20 DIAGNOSIS — E785 Hyperlipidemia, unspecified: Secondary | ICD-10-CM

## 2015-04-20 DIAGNOSIS — I10 Essential (primary) hypertension: Secondary | ICD-10-CM

## 2015-04-20 DIAGNOSIS — I480 Paroxysmal atrial fibrillation: Secondary | ICD-10-CM

## 2015-04-20 LAB — HEPATIC FUNCTION PANEL
ALBUMIN: 4.2 g/dL (ref 3.6–5.1)
ALT: 13 U/L (ref 9–46)
AST: 19 U/L (ref 10–35)
Alkaline Phosphatase: 81 U/L (ref 40–115)
BILIRUBIN INDIRECT: 0.6 mg/dL (ref 0.2–1.2)
Bilirubin, Direct: 0.2 mg/dL (ref <=0.2)
TOTAL PROTEIN: 6.7 g/dL (ref 6.1–8.1)
Total Bilirubin: 0.8 mg/dL (ref 0.2–1.2)

## 2015-04-20 LAB — BASIC METABOLIC PANEL
BUN: 15 mg/dL (ref 7–25)
CHLORIDE: 102 mmol/L (ref 98–110)
CO2: 28 mmol/L (ref 20–31)
Calcium: 8.8 mg/dL (ref 8.6–10.3)
Creat: 1.29 mg/dL — ABNORMAL HIGH (ref 0.70–1.11)
Glucose, Bld: 110 mg/dL — ABNORMAL HIGH (ref 65–99)
POTASSIUM: 4.1 mmol/L (ref 3.5–5.3)
SODIUM: 138 mmol/L (ref 135–146)

## 2015-04-20 LAB — LIPID PANEL
CHOL/HDL RATIO: 3.4 ratio (ref <=5.0)
CHOLESTEROL: 115 mg/dL — AB (ref 125–200)
HDL: 34 mg/dL — AB (ref >=40)
LDL CALC: 67 mg/dL (ref <130)
TRIGLYCERIDES: 69 mg/dL (ref <150)
VLDL: 14 mg/dL (ref <30)

## 2015-04-20 NOTE — Addendum Note (Signed)
Addended by: Tonita Phoenix on: 04/20/2015 09:33 AM   Modules accepted: Orders

## 2015-04-22 ENCOUNTER — Telehealth: Payer: Self-pay | Admitting: Internal Medicine

## 2015-04-22 DIAGNOSIS — R911 Solitary pulmonary nodule: Secondary | ICD-10-CM

## 2015-04-22 NOTE — Telephone Encounter (Signed)
Spoke with pt, requesting results of CT from Wednesday.    MR please advise.  Thanks!

## 2015-04-22 NOTE — Telephone Encounter (Signed)
Pt aware of results/recs.  rov scheduled, ct chest ordered.  Nothing further needed.

## 2015-04-22 NOTE — Telephone Encounter (Signed)
Ct Chest Wo Contrast  04/20/2015  CLINICAL DATA:  Subsequent encounter for lung nodule EXAM: CT CHEST WITHOUT CONTRAST TECHNIQUE: Multidetector CT imaging of the chest was performed following the standard protocol without IV contrast. COMPARISON:  04/13/2014 and 05/20/2007 FINDINGS: Mediastinum / Lymph Nodes: There is no axillary lymphadenopathy. No mediastinal lymphadenopathy. The heart is enlarged. Coronary artery calcification is noted. Left-sided permanent pacemaker remains in place. The esophagus has normal CT imaging features. Lungs / Pleura: Previously described 3 mm and 5 mm right lower lobe pulmonary nodules persist and are unchanged (see images 38 and 39 series 3). A new right lower lobe subpleural nodule along the major fissure measures 5 mm (image 41 series 3). In the posterior left costophrenic sulcus there is a new area of nodular opacity measuring up to 2.7 cm (image 55 series 3). Given the appearance on all 3 planes, this is most likely related atelectasis. MSK / Soft Tissues: Bone windows reveal no worrisome lytic or sclerotic osseous lesions. Upper Abdomen: Gallbladder is surgically absent. Granulomatous disease noted in the spleen. IMPRESSION: 1. Stable appearance of the tiny right lower lobe pulmonary nodule seen previously, consistent with benign disease. 2. There is a new focal opacity in the posterior left costophrenic sulcus, likely atelectatic although infection or neoplasm cannot be entirely excluded. Consider repeat CT chest without contrast in 3 months to assess for resolution. If the lesion is persistent at that time, PET-CT could be used to further evaluate. 3. A new 5 mm subpleural nodule along the right major fissure. Continued attention on follow-up recommended. 4. Atherosclerosis.  All Electronically Signed   By: Kennith Center M.D.   On: 04/20/2015 10:53    Let Cindie Crumbly know that old right lower lobe pulmonary nodule is stable in the past one year and therefore is benign.  However there is a new nodule in the left lung in the low part and another one in the right lung. These are very small. Given the fact they're new they're unlikely to be cancer but to be on the safe side repeat CT chest without contrast in 3 months. Give follow-up at that time to see me

## 2015-05-02 ENCOUNTER — Telehealth: Payer: Self-pay | Admitting: Cardiology

## 2015-05-02 ENCOUNTER — Other Ambulatory Visit (HOSPITAL_COMMUNITY): Payer: Self-pay | Admitting: *Deleted

## 2015-05-02 MED ORDER — SPIRONOLACTONE 25 MG PO TABS: 12.5000 mg | ORAL_TABLET | Freq: Every day | ORAL | Status: DC

## 2015-05-02 MED ORDER — SPIRONOLACTONE 25 MG PO TABS
12.5000 mg | ORAL_TABLET | Freq: Every day | ORAL | Status: DC
Start: 2015-05-02 — End: 2015-05-02

## 2015-05-02 NOTE — Telephone Encounter (Signed)
°*  STAT* If patient is at the pharmacy, call can be transferred to refill team.   1. Which medications need to be refilled? (please list name of each medication and dose if known) Spironolactone  2. Which pharmacy/location (including street and city if local pharmacy) is medication to be sent to? [facility] on [address] [place]   3. Do they need a 30 day or 90 day supply? 90 Day

## 2015-05-02 NOTE — Telephone Encounter (Signed)
Pt's Rx sent to his pharmacy °

## 2015-05-09 ENCOUNTER — Other Ambulatory Visit (HOSPITAL_COMMUNITY): Payer: Self-pay | Admitting: *Deleted

## 2015-05-09 MED ORDER — SIMVASTATIN 10 MG PO TABS: 10.0000 mg | ORAL_TABLET | Freq: Every evening | ORAL | Status: DC

## 2015-06-07 ENCOUNTER — Ambulatory Visit (INDEPENDENT_AMBULATORY_CARE_PROVIDER_SITE_OTHER): Payer: Medicare Other | Admitting: Internal Medicine

## 2015-06-07 ENCOUNTER — Encounter: Payer: Self-pay | Admitting: Internal Medicine

## 2015-06-07 VITALS — BP 120/84 | HR 71 | Ht 75.0 in | Wt 220.8 lb

## 2015-06-07 DIAGNOSIS — I5022 Chronic systolic (congestive) heart failure: Secondary | ICD-10-CM

## 2015-06-07 DIAGNOSIS — Z95810 Presence of automatic (implantable) cardiac defibrillator: Secondary | ICD-10-CM

## 2015-06-07 DIAGNOSIS — I428 Other cardiomyopathies: Secondary | ICD-10-CM

## 2015-06-07 DIAGNOSIS — I4891 Unspecified atrial fibrillation: Secondary | ICD-10-CM

## 2015-06-07 DIAGNOSIS — I429 Cardiomyopathy, unspecified: Secondary | ICD-10-CM

## 2015-06-07 NOTE — Progress Notes (Signed)
Patient Care Team: Laren Boom, DO as PCP - General (Family Medicine) Quintella Reichert, MD as Consulting Physician (Cardiology)   HPI  [name] [name] is a 81 y.o. male Seen in followup for an ICD implanted for primary prevention in the setting of nonischemic cardiomyopathy identified and confirmed by catheterization in 2008. Because of symptoms of heart failure and left bundle branch block he underwent CRT-D implantation with a Medtronic device and a 6947 defibrillator lead. He had a lazerus-like effect.   He also has a history of atrial fibrillation For this he was treated with amiodarone. Surveillance labs   Normal   NO BLEEEDING  Digoxin level 8/15 was 0.8  echocardiogram 8/14 was 25-30%  The patient denies chest pain, shortness of breath, nocturnal dyspnea, orthopnea or peripheral edema.  There have been no palpitations, lightheadedness or syncope.  His biggest complaint is fatigue.   Past Medical History  Diagnosis Date  . Nonischemic cardiomyopathy (HCC)     ef 10 %  . BPH (benign prostatic hypertrophy)   . Hypothyroidism   . Left bundle branch block   . Atrial fibrillation (HCC)     fibrillation/flutter  . Depression   . Sleep apnea     stopped using CPAP  . Hyperlipidemia   . Thrombocytopenia (HCC)   . Osteoarthritis   . Anemia, unspecified 04/07/2013  . Hypertension   . Coronary artery disease 2008    nonobstructive ASCAD  . Chronic systolic CHF (congestive heart failure) (HCC)     Nonischemic DCM EF 10% s/p BiV AICD  . OSA (obstructive sleep apnea)     intolerant to CPAP    Past Surgical History  Procedure Laterality Date  . Transurethral resection of prostate      for BPH  . Cholecystectomy    . Colonscopy      reportedly negative per pt; around 2005.    . Cardioversion N/A 12/29/2012    Procedure: CARDIOVERSION;  Surgeon: Quintella Reichert, MD;  Location: [facility];  Service: Cardiovascular;  Laterality: N/A;  . Cardiac defibrillator placement        left chest    Current Outpatient Prescriptions  Medication Sig Dispense Refill  . acetaminophen (TYLENOL) 650 MG CR tablet Take 1 tablet (650 mg total) by mouth every 8 (eight) hours as needed for pain. 90 tablet 3  . amiodarone (PACERONE) 200 MG tablet Take 0.5 tablets (100 mg total) by mouth daily. 45 tablet 3  . carvedilol (COREG) 3.125 MG tablet Take 1 tablet (3.125 mg total) by mouth 2 (two) times daily. 180 tablet 3  . digoxin (LANOXIN) 0.125 MG tablet Take 1 tablet (0.125 mg total) by mouth every other day. 45 tablet 3  . gabapentin (NEURONTIN) 100 MG capsule Take 1 capsule (100 mg total) by mouth at bedtime as needed (neuropathy). 90 capsule 0  . levothyroxine (SYNTHROID, LEVOTHROID) 125 MCG tablet Take 1 tablet (125 mcg total) by mouth daily before breakfast. 90 tablet 1  . potassium chloride SA (K-DUR,KLOR-CON) 20 MEQ tablet Take 20 mEq by mouth as needed. When you take Torsemide    . ramipril (ALTACE) 2.5 MG capsule Take 1 capsule (2.5 mg total) by mouth daily. 90 capsule 3  . simvastatin (ZOCOR) 10 MG tablet Take 1 tablet (10 mg total) by mouth every evening. 90 tablet 6  . spironolactone (ALDACTONE) 25 MG tablet Take 0.5 tablets (12.5 mg total) by mouth daily. 45 tablet 3  . torsemide (DEMADEX) 20 MG tablet  Take 20 mg by mouth as needed. As needed for weight 215 or greater. Take 20 mg    . warfarin (COUMADIN) 5 MG tablet Take 1 tablet (5 mg total) by mouth daily at 6 PM. 30 tablet 5   No current facility-administered medications for this visit.    No Known Allergies  Review of Systems negative except from HPI and PMH  Physical Exam BP 120/84 mmHg  Pulse 71  Ht  (1.905 m)  Wt 220 lb 12.8 oz (100.154 kg)  BMI 27.60 kg/m2 Well developed and well nourished in no acute distress HENT normal E scleral and icterus clear Neck Supple JVP flat; carotids brisk and full Clear to ausculation  Regular rate and rhythm, no murmurs gallops or rub Soft with active bowel  sounds No clubbing cyanosis 1+Edema Alert and oriented, grossly normal motor and sensory function Skin Warm and Dry  AV pacing   Assessment and  Plan  Nonischemic cardiomyopathy   Congestive heart failure-chronic-systolic   Atrial fibrillation-paroxysmal  Monitoring for high-risk medications   Implantable defibrillator-CRT-Medtronic     amio surveillance labs ok 10/16   Euvolemic continue current meds  Continue Guideline directed medical therapy

## 2015-06-07 NOTE — Patient Instructions (Signed)
Medication Instructions: - no changes  Labwork: - none  Procedures/Testing: - none  Follow-Up: - Remote monitoring is used to monitor your Pacemaker of ICD from home. This monitoring reduces the number of office visits required to check your device to one time per year. It allows us to keep an eye on the functioning of your device to ensure it is working properly. You are scheduled for a device check from home on 09/06/15. You may send your transmission at any time that day. If you have a wireless device, the transmission will be sent automatically. After your physician reviews your transmission, you will receive a postcard with your next transmission date.  - Your physician wants you to follow-up in: 1 year with Dr. Klein. You will receive a reminder letter in the mail two months in advance. If you don't receive a letter, please call our office to schedule the follow-up appointment.  Any Additional Special Instructions Will Be Listed Below (If Applicable).   

## 2015-06-13 LAB — CUP PACEART INCLINIC DEVICE CHECK
Brady Statistic AP VP Percent: 58.54 %
Brady Statistic AP VS Percent: 0.09 %
Brady Statistic AS VP Percent: 40.39 %
Brady Statistic RA Percent Paced: 58.62 %
Brady Statistic RV Percent Paced: 98.92 %
Date Time Interrogation Session: 20161213215801
HIGH POWER IMPEDANCE MEASURED VALUE: 456 Ohm
HighPow Impedance: 42 Ohm
HighPow Impedance: 55 Ohm
Implantable Lead Implant Date: 20080806
Implantable Lead Implant Date: 20080806
Implantable Lead Location: 753858
Implantable Lead Location: 753859
Lead Channel Impedance Value: 1026 Ohm
Lead Channel Impedance Value: 437 Ohm
Lead Channel Impedance Value: 646 Ohm
Lead Channel Pacing Threshold Amplitude: 0.75 V
Lead Channel Sensing Intrinsic Amplitude: 0.4 mV
MDC IDC LEAD IMPLANT DT: 20080806
MDC IDC LEAD LOCATION: 753860
MDC IDC MSMT BATTERY VOLTAGE: 2.78 V
MDC IDC MSMT LEADCHNL LV IMPEDANCE VALUE: 532 Ohm
MDC IDC MSMT LEADCHNL LV PACING THRESHOLD AMPLITUDE: 1.25 V
MDC IDC MSMT LEADCHNL LV PACING THRESHOLD PULSEWIDTH: 0.4 ms
MDC IDC MSMT LEADCHNL RA PACING THRESHOLD PULSEWIDTH: 0.4 ms
MDC IDC MSMT LEADCHNL RV IMPEDANCE VALUE: 532 Ohm
MDC IDC MSMT LEADCHNL RV PACING THRESHOLD AMPLITUDE: 1 V
MDC IDC MSMT LEADCHNL RV PACING THRESHOLD PULSEWIDTH: 0.4 ms
MDC IDC MSMT LEADCHNL RV SENSING INTR AMPL: 10 mV
MDC IDC SET LEADCHNL LV PACING AMPLITUDE: 2.25 V
MDC IDC SET LEADCHNL LV PACING PULSEWIDTH: 0.4 ms
MDC IDC SET LEADCHNL RA PACING AMPLITUDE: 2 V
MDC IDC SET LEADCHNL RV PACING AMPLITUDE: 2.5 V
MDC IDC SET LEADCHNL RV PACING PULSEWIDTH: 0.4 ms
MDC IDC SET LEADCHNL RV SENSING SENSITIVITY: 0.3 mV
MDC IDC STAT BRADY AS VS PERCENT: 0.99 %

## 2015-06-15 ENCOUNTER — Ambulatory Visit (INDEPENDENT_AMBULATORY_CARE_PROVIDER_SITE_OTHER): Payer: Medicare Other | Admitting: Family Medicine

## 2015-06-15 ENCOUNTER — Ambulatory Visit: Payer: Medicare Other

## 2015-06-15 DIAGNOSIS — I4891 Unspecified atrial fibrillation: Secondary | ICD-10-CM | POA: Diagnosis not present

## 2015-06-15 LAB — POCT INR: INR: 2.6

## 2015-06-15 NOTE — Progress Notes (Signed)
Personally told patient that INR is at goal, continue coumadin 5mg  daily. Return in one month

## 2015-06-28 ENCOUNTER — Telehealth: Payer: Self-pay | Admitting: Internal Medicine

## 2015-06-28 NOTE — Telephone Encounter (Signed)
Went on line with [facility] this member does not required precert for chest ct Tobe Sos

## 2015-07-18 ENCOUNTER — Other Ambulatory Visit: Payer: Self-pay | Admitting: Family Medicine

## 2015-07-18 ENCOUNTER — Ambulatory Visit (INDEPENDENT_AMBULATORY_CARE_PROVIDER_SITE_OTHER)
Admission: RE | Admit: 2015-07-18 | Discharge: 2015-07-18 | Disposition: A | Discharge status: Home / Self Care | Payer: Medicare Other | Source: Ambulatory Visit | Attending: Internal Medicine | Admitting: Internal Medicine

## 2015-07-18 DIAGNOSIS — R911 Solitary pulmonary nodule: Secondary | ICD-10-CM | POA: Diagnosis not present

## 2015-07-21 ENCOUNTER — Ambulatory Visit: Payer: Medicare Other | Admitting: Internal Medicine

## 2015-07-22 ENCOUNTER — Ambulatory Visit (INDEPENDENT_AMBULATORY_CARE_PROVIDER_SITE_OTHER): Payer: Medicare Other | Admitting: Internal Medicine

## 2015-07-22 ENCOUNTER — Encounter: Payer: Self-pay | Admitting: Internal Medicine

## 2015-07-22 VITALS — BP 108/70 | HR 74 | Ht 75.0 in | Wt 218.0 lb

## 2015-07-22 DIAGNOSIS — R911 Solitary pulmonary nodule: Secondary | ICD-10-CM

## 2015-07-22 DIAGNOSIS — R942 Abnormal results of pulmonary function studies: Secondary | ICD-10-CM | POA: Diagnosis not present

## 2015-07-22 NOTE — Progress Notes (Signed)
Subjective:     Patient ID: [name] [name], male   DOB: 1932-04-23, 82 y.o.   MRN: [id number]  HPI   PCP Laren Boom, DO Cards  - Dr Armanda Magic  HPI  IOV  04/07/2014  Chief Complaint  Patient presents with  . Pulmonary Consult    Pt referred by Dr. Armanda Magic for abnormal PFT.    [name] [name] is a 82 y.o. male with a history of NIDCM, nonobstructive ASCAD, chronic CHF, HTN, PAF, OSA and dyslipidemia who presents for followup. He is followed in [facility] for NYHA class II-III CHF He is on CPAP followed by Dr Armanda Magic. He is  On AMio for PAF and is on NSR as of cards visit sept 2015. HE had surveillance PFT while on amio 03/30/14 and showed isolated low dlco 24/63% but with some reductioin in TLC to 79% that suggests possibly restriction. THere is question of amio lung toxicity and therefore referred here. Spirometry is normal  He states he has been amio for 4 years. First few months possibly at 400mg  per day and then 200mg  per day till several weeks or few months ago down to 100mg  per day. He is concerned about its side effects but admits that is efficacious. He has general tiredness but no dyspnea or cough or edema or pnd     ECHO aug 2015  - LVEF 25%, PASP elevated at , RV dilated  Imaging hx   CT chest 2008 - benign nodules but no infilrate (has evidence of pulm htn)  CXR 2014  - acute pulm edema; no followup after that  CBC   hgb 13.5gm% hgb 12/23/13  Walk test 04/07/2014  - 185 feet x 3 laps: no desaturation      OV 11/17/2014  Chief Complaint  Patient presents with  . Follow-up    Pt here after PFT. Pt stated his breathing overall is unchanged. Pt c/o fatigue. Pt denies cough and CP/tightness.    Follow-up for potential amiodarone lung toxicity with finding of isolated reduction in diffusion capacity to 63% in October 2015  He continues to be asymptomatic from a respiratory standpoint. He continues to have general fatigue. He continues to  have gentle balance issues and walks with a cane. None of this is changed for the worse off for the better. Everything is stable. He did have a CT scan of the chest high resolution October 2015 that did not show any evidence of interstitial lung disease so no evidence of amiodarone lung toxicity but he did have new findings of lung nodules 4 mm as described below [I personally review this film today]. He presents for follow-up right now and there are no new issues. He did have repeat pulmonary function test today and his PFTs are essentially unchanged with a DLCO of 63%. I personally reviewed the image is well  Interestingly on exam finding and hearing crackles for the first time  Walking desaturateion test 185 feet x 3 laps slow on RA on 11/17/2014: 100% on RA throughout without desats  CT chest 04/13/14 - personally visualized image 11/17/2014  A) 1. No findings to suggest underlying interstitial lung disease.  So NO evidence of amio lung toxicity B) 3. Two tiny pulmonary nodules in the right lower lobe measuring 3  and 4 mm on images 39 and 40 of series 5 respectively. These are  highly nonspecific. If the patient is at high risk for bronchogenic  carcinoma, follow-up chest CT at 1  year is recommended. So, please order it   OV 07/22/2015  Chief Complaint  Patient presents with  . Follow-up    Pt here after CT chest. Pt states his breathing is unchanged since last OV in 10/2014 - pt states he is at his baseline SOB - DOE. Pt denies cough and CP/tightness.    Follow-up isolated low diffusion capacity without evidence of lung disease but in the setting of amiodarone intake Follow-up lung nodule  Dyspnea stable. No new issues. He works out at [employer]. He walks slowly. He does not want to attend pulmonary rehabilitation due to cost issues and also because he did not feel better with prior attempt. He continues with amiodarone. He had CT chest January 2017 that does not show evidence of interstitial  lung disease. Last pulmonary function test May 2016 was stable with isolated low diffusion capacity.  Lung nodules one of them is improved that is resolved. This is compared to fall 2017.'  No new issues. Other than the fact that he's having his defibrillator change in summer 2017   Current outpatient prescriptions:  .  acetaminophen (TYLENOL) 650 MG CR tablet, Take 1 tablet (650 mg total) by mouth every 8 (eight) hours as needed for pain., Disp: 90 tablet, Rfl: 3 .  amiodarone (PACERONE) 200 MG tablet, Take 0.5 tablets (100 mg total) by mouth daily., Disp: 45 tablet, Rfl: 3 .  carvedilol (COREG) 3.125 MG tablet, Take 1 tablet (3.125 mg total) by mouth 2 (two) times daily., Disp: 180 tablet, Rfl: 3 .  digoxin (LANOXIN) 0.125 MG tablet, Take 1 tablet (0.125 mg total) by mouth every other day., Disp: 45 tablet, Rfl: 3 .  gabapentin (NEURONTIN) 100 MG capsule, Take 1 capsule (100 mg total) by mouth at bedtime as needed (neuropathy)., Disp: 90 capsule, Rfl: 0 .  levothyroxine (SYNTHROID, LEVOTHROID) 125 MCG tablet, Take 1 tablet (125 mcg total) by mouth daily before breakfast., Disp: 90 tablet, Rfl: 1 .  potassium chloride SA (K-DUR,KLOR-CON) 20 MEQ tablet, Take 20 mEq by mouth as needed. When you take Torsemide, Disp: , Rfl:  .  ramipril (ALTACE) 2.5 MG capsule, Take 1 capsule (2.5 mg total) by mouth daily., Disp: 90 capsule, Rfl: 3 .  simvastatin (ZOCOR) 10 MG tablet, Take 1 tablet (10 mg total) by mouth every evening., Disp: 90 tablet, Rfl: 6 .  spironolactone (ALDACTONE) 25 MG tablet, Take 0.5 tablets (12.5 mg total) by mouth daily., Disp: 45 tablet, Rfl: 3 .  torsemide (DEMADEX) 20 MG tablet, Take 20 mg by mouth as needed. As needed for weight 215 or greater. Take 20 mg, Disp: , Rfl:  .  warfarin (COUMADIN) 5 MG tablet, take 1 tablet by mouth once daily AT 6PM, Disp: 30 tablet, Rfl: 5   Immunization History  Administered Date(s) Administered  . Influenza,inj,Quad PF,36+ Mos 03/29/2014,  04/13/2015  . Tdap 06/30/2009    No Known Allergies\    Review of Systems     Objective:   Physical Exam  Constitutional: He is oriented to person, place, and time. He appears well-developed and well-nourished. No distress.  Physically deconditioned  HENT:  Head: Normocephalic and atraumatic.  Right Ear: External ear normal.  Left Ear: External ear normal.  Mouth/Throat: Oropharynx is clear and moist. No oropharyngeal exudate.  Eyes: Conjunctivae and EOM are normal. Pupils are equal, round, and reactive to light. Right eye exhibits no discharge. Left eye exhibits no discharge. No scleral icterus.  Neck: Normal range of motion. Neck supple. No  JVD present. No tracheal deviation present. No thyromegaly present.  Cardiovascular: Normal rate, regular rhythm and intact distal pulses.  Exam reveals no gallop and no friction rub.   No murmur heard. Pulmonary/Chest: Effort normal and breath sounds normal. No respiratory distress. He has no wheezes. He has no rales. He exhibits no tenderness.  Abdominal: Soft. Bowel sounds are normal. He exhibits no distension and no mass. There is no tenderness. There is no rebound and no guarding.  Musculoskeletal: Normal range of motion. He exhibits no edema or tenderness.  Uses cane Slow gait  Lymphadenopathy:    He has no cervical adenopathy.  Neurological: He is alert and oriented to person, place, and time. He has normal reflexes. No cranial nerve deficit. Coordination normal.  Skin: Skin is warm and dry. No rash noted. He is not diaphoretic. No erythema. No pallor.  Psychiatric: He has a normal mood and affect. His behavior is normal. Judgment and thought content normal.  Nursing note and vitals reviewed.   Filed Vitals:   07/22/15 0919  BP: 108/70  Pulse: 74  Height: 6\' 3"  (1.905 m)  Weight: 218 lb (98.884 kg)  SpO2: 95%       Assessment:       ICD-9-CM ICD-10-CM   1. Lung nodule 793.11 R91.1   2. Abnormal PFTs (pulmonary function  tests) 794.2 R94.2         Plan:      #Lung nodule  - This is improved  #abnormal pulmonary function test in the setting of amiodarone intake -0 clinically no evidence of amiodarone lung toxicity on January 2017 CT chest - Do full pulmonary function test October/November 2017  Follow-up - Pulmonary function test October/November 2017 - Return office visit with Dr. Marchelle Gearing after pulmonary function test in October/November 2017   Dr. Kalman Shan, M.D., F.C.C.P Pulmonary and Critical Care Medicine Staff Physician [facility] Pager: [phone], If no answer or between  15:00h - 7:00h: call [phone]  07/22/2015 9:59 AM

## 2015-07-22 NOTE — Patient Instructions (Signed)
ICD-9-CM ICD-10-CM   1. Lung nodule 793.11 R91.1   2. Abnormal PFTs (pulmonary function tests) 794.2 R94.2    #Lung nodule  - This is improved  #abnormal pulmonary function test in the setting of amiodarone intake -0 clinically no evidence of amiodarone lung toxicity on January 2017 CT chest - Do full pulmonary function test October/November 2017  Follow-up - Pulmonary function test October/November 2017 - Return office visit with Dr. Marchelle [name] after pulmonary function test in October/November 2017

## 2015-08-05 ENCOUNTER — Other Ambulatory Visit: Payer: Self-pay | Admitting: Internal Medicine

## 2015-08-05 MED ORDER — SIMVASTATIN 10 MG PO TABS: 10.0000 mg | ORAL_TABLET | Freq: Every evening | ORAL | Status: DC

## 2015-08-11 ENCOUNTER — Ambulatory Visit (INDEPENDENT_AMBULATORY_CARE_PROVIDER_SITE_OTHER): Payer: Medicare Other | Admitting: Family Medicine

## 2015-08-11 VITALS — BP 101/58 | HR 80

## 2015-08-11 DIAGNOSIS — I482 Chronic atrial fibrillation, unspecified: Secondary | ICD-10-CM

## 2015-08-11 LAB — POCT INR: INR: 2.3

## 2015-08-11 NOTE — Progress Notes (Signed)
Pt.notified

## 2015-08-11 NOTE — Progress Notes (Signed)
Will you please let patient know that his INR is at goal, continue coumadin 5mg  daily. Return in one month

## 2015-08-15 ENCOUNTER — Other Ambulatory Visit: Payer: Self-pay

## 2015-08-15 DIAGNOSIS — G609 Hereditary and idiopathic neuropathy, unspecified: Secondary | ICD-10-CM

## 2015-08-15 MED ORDER — WARFARIN SODIUM 5 MG PO TABS: ORAL_TABLET | ORAL | Status: DC

## 2015-08-15 MED ORDER — LEVOTHYROXINE SODIUM 125 MCG PO TABS: 125.0000 ug | ORAL_TABLET | Freq: Every day | ORAL | Status: DC

## 2015-08-15 MED ORDER — SPIRONOLACTONE 25 MG PO TABS: 12.5000 mg | ORAL_TABLET | Freq: Every day | ORAL | Status: DC

## 2015-08-18 ENCOUNTER — Other Ambulatory Visit (HOSPITAL_COMMUNITY): Payer: Self-pay | Admitting: *Deleted

## 2015-08-18 MED ORDER — SIMVASTATIN 10 MG PO TABS: 10.0000 mg | ORAL_TABLET | Freq: Every evening | ORAL | Status: DC

## 2015-08-22 ENCOUNTER — Other Ambulatory Visit: Payer: Self-pay | Admitting: Family Medicine

## 2015-08-22 ENCOUNTER — Other Ambulatory Visit: Payer: Self-pay

## 2015-08-22 MED ORDER — WARFARIN SODIUM 5 MG PO TABS: ORAL_TABLET | ORAL | Status: DC

## 2015-08-22 MED ORDER — LEVOTHYROXINE SODIUM 125 MCG PO TABS: 125.0000 ug | ORAL_TABLET | Freq: Every day | ORAL | Status: DC

## 2015-08-24 ENCOUNTER — Ambulatory Visit (INDEPENDENT_AMBULATORY_CARE_PROVIDER_SITE_OTHER): Payer: Medicare Other | Admitting: Family Medicine

## 2015-08-24 ENCOUNTER — Encounter: Payer: Self-pay | Admitting: Family Medicine

## 2015-08-24 VITALS — BP 105/64 | HR 78 | Temp 97.5°F | Wt 218.0 lb

## 2015-08-24 DIAGNOSIS — R0982 Postnasal drip: Secondary | ICD-10-CM

## 2015-08-24 DIAGNOSIS — E039 Hypothyroidism, unspecified: Secondary | ICD-10-CM | POA: Diagnosis not present

## 2015-08-24 DIAGNOSIS — Z7901 Long term (current) use of anticoagulants: Secondary | ICD-10-CM | POA: Diagnosis not present

## 2015-08-24 LAB — PROTIME-INR
INR: 1.96 — AB (ref <1.50)
PROTHROMBIN TIME: 22.6 s — AB (ref 11.6–15.2)

## 2015-08-24 LAB — TSH: TSH: 1.27 mIU/L (ref 0.40–4.50)

## 2015-08-24 NOTE — Progress Notes (Signed)
CC: [name] [name] is a 82 y.o. male is here for Medication Refill   Subjective: HPI:  Follow-up hyperthyroidism: No unintentional weight loss or gain. No skin complaints or gastro-intestinal complaints. Taking the levothyroxine with 100% compliance.  Follow-up atrial fibrillation: Taking Coumadin on a daily basis with no bleeding or bruising abnormalities. He denies any racing heart or irregular heartbeat. He denies any motor or sensory disturbances. No chest pain shortness of breath orthopnea nor peripheral edema.  His major complaint today is a issue with nasal congestion worse first thing in the morning moderate in severity. When it's bad he notices that his voice has a higher pitched than he is used to having. Denies dysphagia, sore throat or difficulty swallowing. It seems to fluctuate throughout the day. He is not sure if anything makes it better or worse other than that described above. No interventions as of yet   Review Of Systems Outlined In HPI  Past Medical History  Diagnosis Date  . Nonischemic cardiomyopathy (HCC)     ef 10 %  . BPH (benign prostatic hypertrophy)   . Hypothyroidism   . Left bundle branch block   . Atrial fibrillation (HCC)     fibrillation/flutter  . Depression   . Sleep apnea     stopped using CPAP  . Hyperlipidemia   . Thrombocytopenia (HCC)   . Osteoarthritis   . Anemia, unspecified 04/07/2013  . Hypertension   . Coronary artery disease 2008    nonobstructive ASCAD  . Chronic systolic CHF (congestive heart failure) (HCC)     Nonischemic DCM EF 10% s/p BiV AICD  . OSA (obstructive sleep apnea)     intolerant to CPAP    Past Surgical History  Procedure Laterality Date  . Transurethral resection of prostate      for BPH  . Cholecystectomy    . Colonscopy      reportedly negative per pt; around 2005.    . Cardioversion N/A 12/29/2012    Procedure: CARDIOVERSION;  Surgeon: Quintella Reichert, MD;  Location: [facility];  Service:  Cardiovascular;  Laterality: N/A;  . Cardiac defibrillator placement      left chest   Family History  Problem Relation Age of Onset  . Heart failure Mother   . Heart disease Father   . Cancer Maternal Uncle     Unknown subtype  . Stroke Mother   . Heart attack Neg Hx     Social History   Social History  . Marital Status: Married    Spouse Name: N/A  . Number of Children: 4  . Years of Education: N/A   Occupational History  . retired     retired Optometrist; now in [employer], political party  .     Social History Main Topics  . Smoking status: Never Smoker   . Smokeless tobacco: Never Used  . Alcohol Use: No  . Drug Use: No  . Sexual Activity: No   Other Topics Concern  . Not on file   Social History Narrative     Objective: BP 105/64 mmHg  Pulse 78  Temp(Src) 97.5 F (36.4 C) (Oral)  Wt 218 lb (98.884 kg)  SpO2 95%  Vital signs reviewed. General: Alert and Oriented, No Acute Distress HEENT: Pupils equal, round, reactive to light. Conjunctivae clear.  External ears unremarkable.  Moist mucous membranes. Lungs: Clear and comfortable work of breathing, speaking in full sentences without accessory muscle use. Cardiac: Irregularly irregular rhythm less than 100 bpm.  Neuro: CN II-XII grossly intact, gait normal. Extremities: No peripheral edema.  Strong peripheral pulses.  Mental Status: No depression, anxiety, nor agitation. Logical though process. Skin: Warm and dry.  Assessment & Plan: [name] [name] was seen today for medication refill.  Diagnoses and all orders for this visit:  Hypothyroidism, unspecified hypothyroidism type -     TSH  Chronic anticoagulation -     INR/PT  Postnasal drip  Other orders -     Cancel: warfarin (COUMADIN) 5 MG tablet; take 1 tablet by mouth once daily AT 6PM -     Cancel: levothyroxine (SYNTHROID, LEVOTHROID) 125 MCG tablet; Take 1 tablet (125 mcg total) by mouth daily before breakfast.   Hypothyroidism: Clinically  controlled but due for TSH today, continue levothyroxine 125 MCG is pending TSH today. He'll need a 90 day supply of refills Chronic anticoagulation for atrial fibrillation. He'll be due for an INR in the middle of next month however since he is getting blood work done today I'll add on the INR so he doesn't have to come back in 2 weeks. Postnasal drip: Start over-the-counter Nasacort or Flonase on a daily basis.Signs and symptoms requring emergent/urgent reevaluation were discussed with the patient.  25 minutes spent face-to-face during visit today of which at least 50% was counseling or coordinating care regarding: 1. Hypothyroidism, unspecified hypothyroidism type   2. Chronic anticoagulation   3. Postnasal drip      Return in about 6 months (around 02/24/2016), or if symptoms worsen or fail to improve.

## 2015-08-24 NOTE — Patient Instructions (Signed)
Nasacort or Flonase nasal spray.  Whichever is cheaper.

## 2015-08-25 ENCOUNTER — Telehealth: Payer: Self-pay | Admitting: Family Medicine

## 2015-08-25 NOTE — Telephone Encounter (Signed)
Pt advised.

## 2015-08-25 NOTE — Telephone Encounter (Signed)
Will you please let patient know that his INR is at goal and his thyroid supplement appears to be adequately dosed.  I'd recommend he have a nurse visit to recheck his INR in one month.

## 2015-08-29 LAB — POCT INR: INR: 3.6

## 2015-09-06 ENCOUNTER — Ambulatory Visit (INDEPENDENT_AMBULATORY_CARE_PROVIDER_SITE_OTHER): Payer: Medicare Other | Admitting: *Deleted

## 2015-09-06 DIAGNOSIS — Z95810 Presence of automatic (implantable) cardiac defibrillator: Secondary | ICD-10-CM | POA: Diagnosis not present

## 2015-09-06 DIAGNOSIS — I429 Cardiomyopathy, unspecified: Secondary | ICD-10-CM | POA: Diagnosis not present

## 2015-09-06 DIAGNOSIS — I428 Other cardiomyopathies: Secondary | ICD-10-CM

## 2015-09-06 NOTE — Progress Notes (Signed)
Remote ICD transmission.   

## 2015-09-09 ENCOUNTER — Other Ambulatory Visit: Payer: Self-pay

## 2015-09-09 MED ORDER — LEVOTHYROXINE SODIUM 125 MCG PO TABS: 125.0000 ug | ORAL_TABLET | Freq: Every day | ORAL | Status: DC

## 2015-09-16 LAB — CUP PACEART REMOTE DEVICE CHECK
Battery Voltage: 2.67 V
Brady Statistic RA Percent Paced: 76.89 %
Brady Statistic RV Percent Paced: 99.7 %
Date Time Interrogation Session: 20170314041708
HIGH POWER IMPEDANCE MEASURED VALUE: 342 Ohm
HIGH POWER IMPEDANCE MEASURED VALUE: 55 Ohm
HighPow Impedance: 42 Ohm
Implantable Lead Implant Date: 20080806
Implantable Lead Implant Date: 20080806
Implantable Lead Location: 753858
Implantable Lead Location: 753859
Implantable Lead Model: 4076
Implantable Lead Model: 6947
Lead Channel Pacing Threshold Amplitude: 0.75 V
Lead Channel Pacing Threshold Pulse Width: 0.4 ms
Lead Channel Pacing Threshold Pulse Width: 0.4 ms
Lead Channel Sensing Intrinsic Amplitude: 0.375 mV
Lead Channel Setting Pacing Amplitude: 2 V
Lead Channel Setting Pacing Amplitude: 2.5 V
MDC IDC LEAD IMPLANT DT: 20080806
MDC IDC LEAD LOCATION: 753860
MDC IDC MSMT LEADCHNL LV IMPEDANCE VALUE: 1045 Ohm
MDC IDC MSMT LEADCHNL LV IMPEDANCE VALUE: 532 Ohm
MDC IDC MSMT LEADCHNL LV IMPEDANCE VALUE: 589 Ohm
MDC IDC MSMT LEADCHNL LV PACING THRESHOLD AMPLITUDE: 1.375 V
MDC IDC MSMT LEADCHNL LV PACING THRESHOLD PULSEWIDTH: 0.4 ms
MDC IDC MSMT LEADCHNL RA IMPEDANCE VALUE: 437 Ohm
MDC IDC MSMT LEADCHNL RA PACING THRESHOLD AMPLITUDE: 0.625 V
MDC IDC MSMT LEADCHNL RA SENSING INTR AMPL: 0.375 mV
MDC IDC MSMT LEADCHNL RV IMPEDANCE VALUE: 494 Ohm
MDC IDC MSMT LEADCHNL RV SENSING INTR AMPL: 9.5 mV
MDC IDC MSMT LEADCHNL RV SENSING INTR AMPL: 9.5 mV
MDC IDC SET LEADCHNL LV PACING PULSEWIDTH: 0.4 ms
MDC IDC SET LEADCHNL RV PACING AMPLITUDE: 2.5 V
MDC IDC SET LEADCHNL RV PACING PULSEWIDTH: 0.4 ms
MDC IDC SET LEADCHNL RV SENSING SENSITIVITY: 0.3 mV
MDC IDC STAT BRADY AP VP PERCENT: 76.79 %
MDC IDC STAT BRADY AP VS PERCENT: 0.09 %
MDC IDC STAT BRADY AS VP PERCENT: 22.91 %
MDC IDC STAT BRADY AS VS PERCENT: 0.2 %

## 2015-09-19 ENCOUNTER — Encounter: Payer: Self-pay | Admitting: Cardiology

## 2015-10-26 DIAGNOSIS — H903 Sensorineural hearing loss, bilateral: Secondary | ICD-10-CM | POA: Diagnosis not present

## 2015-10-26 DIAGNOSIS — H6123 Impacted cerumen, bilateral: Secondary | ICD-10-CM | POA: Diagnosis not present

## 2015-11-07 ENCOUNTER — Other Ambulatory Visit: Payer: Self-pay | Admitting: Cardiology

## 2015-11-07 ENCOUNTER — Other Ambulatory Visit: Payer: Self-pay | Admitting: Family Medicine

## 2015-11-07 NOTE — Telephone Encounter (Signed)
REFILL 

## 2015-11-11 ENCOUNTER — Ambulatory Visit (INDEPENDENT_AMBULATORY_CARE_PROVIDER_SITE_OTHER): Payer: Medicare Other | Admitting: Sports Medicine

## 2015-11-11 ENCOUNTER — Encounter: Payer: Self-pay | Admitting: Sports Medicine

## 2015-11-11 ENCOUNTER — Other Ambulatory Visit: Payer: Self-pay | Admitting: Cardiology

## 2015-11-11 VITALS — BP 110/68 | HR 76 | Resp 16 | Wt 218.1 lb

## 2015-11-11 DIAGNOSIS — I482 Chronic atrial fibrillation, unspecified: Secondary | ICD-10-CM

## 2015-11-11 DIAGNOSIS — M170 Bilateral primary osteoarthritis of knee: Secondary | ICD-10-CM

## 2015-11-11 LAB — POCT INR: INR: 1.7

## 2015-11-11 MED ORDER — CARVEDILOL 3.125 MG PO TABS: 3.1250 mg | ORAL_TABLET | Freq: Two times a day (BID) | ORAL | Status: DC

## 2015-11-11 MED ORDER — AMIODARONE HCL 200 MG PO TABS: 100.0000 mg | ORAL_TABLET | Freq: Every day | ORAL | Status: DC

## 2015-11-11 NOTE — Progress Notes (Signed)
  Subjective:    CC: Left knee pain  HPI: This is a pleasant 82 year old male, one year ago we did an injection in Orthovisc, he did well. Now having recurrence of pain and swelling, moderate, persistent without radiation.  Unsteady gait: Likely with some degree of peripheral neuropathy and lumbar spinal stenosis considering his forward flexed gait. He does desire to follow this up with Dr. Ivan Anchors.  Past medical history, Surgical history, Family history not pertinant except as noted below, Social history, Allergies, and medications have been entered into the medical record, reviewed, and no changes needed.   Review of Systems: No fevers, chills, night sweats, weight loss, chest pain, or shortness of breath.   Objective:    General: Well Developed, well nourished, and in no acute distress.  Neuro: Alert and oriented x3, extra-ocular muscles intact, sensation grossly intact.  HEENT: Normocephalic, atraumatic, pupils equal round reactive to light, neck supple, no masses, no lymphadenopathy, thyroid nonpalpable.  Skin: Warm and dry, no rashes. Cardiac: Regular rate and rhythm, no murmurs rubs or gallops, no lower extremity edema.  Respiratory: Clear to auscultation bilaterally. Not using accessory muscles, speaking in full sentences. Left Knee: Visibly swollen with a palpable fluid wave and tenderness at the medial joint line.Marland Kitchen ROM normal in flexion and extension and lower leg rotation. Ligaments with solid consistent endpoints including ACL, PCL, LCL, MCL. Negative Mcmurray's and provocative meniscal tests. Non painful patellar compression. Patellar and quadriceps tendons unremarkable. Hamstring and quadriceps strength is normal.  Procedure: Real-time Ultrasound Guided Injection of left knee Device: GE Logiq E  Verbal informed consent obtained.  Time-out conducted.  Noted no overlying erythema, induration, or other signs of local infection.  Skin prepped in a sterile fashion.  Local  anesthesia: Topical Ethyl chloride.  With sterile technique and under real time ultrasound guidance:  Aspirated 20 mL straw-colored fluid, syringe switched and 1 mL kenalog 40, 2 mL lidocaine, 2 mL Marcaine injected easily. Completed without difficulty  Pain immediately resolved suggesting accurate placement of the medication.  Advised to call if fevers/chills, erythema, induration, drainage, or persistent bleeding.  Images permanently stored and available for review in the ultrasound unit.  Impression: Technically successful ultrasound guided injection.  Impression and Recommendations:

## 2015-11-11 NOTE — Assessment & Plan Note (Signed)
One year response to Orthovisc on both knees, left knee aspiration and steroid injection today. Declines proceeding with Orthovisc again. He will also discuss his unsteady gait with his PCP, he has neuropathy but never went past 300 mg of gabapentin, I would also recommend that he have a vitamin B12 level done.

## 2015-11-14 ENCOUNTER — Telehealth: Payer: Self-pay | Admitting: Cardiology

## 2015-11-14 DIAGNOSIS — I5022 Chronic systolic (congestive) heart failure: Secondary | ICD-10-CM

## 2015-11-14 DIAGNOSIS — E785 Hyperlipidemia, unspecified: Secondary | ICD-10-CM

## 2015-11-14 DIAGNOSIS — I480 Paroxysmal atrial fibrillation: Secondary | ICD-10-CM

## 2015-11-14 NOTE — Telephone Encounter (Signed)
New Message:   Pt wants to know if he can have blood work on Friday when he comes in tol see Dr Mayford Knife?

## 2015-11-14 NOTE — Telephone Encounter (Signed)
BMET and FLP with ALT

## 2015-11-14 NOTE — Telephone Encounter (Signed)
Pt asking if he needs lab prior to 11/18/15 appt with Dr Mayford Knife.  Pt advised I will forward to Dr Mayford Knife for review.

## 2015-11-15 NOTE — Telephone Encounter (Signed)
Pt advised, requesting to have lab done before appt 11/18/15 with Dr Mayford Knife. Pt advised lab appt scheduled for 11/18/15 at 9:30AM prior to 10AM appt with Dr Mayford Knife.

## 2015-11-18 ENCOUNTER — Other Ambulatory Visit: Payer: Self-pay

## 2015-11-18 ENCOUNTER — Other Ambulatory Visit: Payer: Medicare Other | Admitting: *Deleted

## 2015-11-18 ENCOUNTER — Ambulatory Visit (INDEPENDENT_AMBULATORY_CARE_PROVIDER_SITE_OTHER): Payer: Medicare Other | Admitting: Cardiology

## 2015-11-18 ENCOUNTER — Encounter: Payer: Self-pay | Admitting: Cardiology

## 2015-11-18 VITALS — BP 118/70 | HR 75 | Ht 75.0 in | Wt 214.4 lb

## 2015-11-18 DIAGNOSIS — I2510 Atherosclerotic heart disease of native coronary artery without angina pectoris: Secondary | ICD-10-CM | POA: Diagnosis not present

## 2015-11-18 DIAGNOSIS — E785 Hyperlipidemia, unspecified: Secondary | ICD-10-CM | POA: Diagnosis not present

## 2015-11-18 DIAGNOSIS — I480 Paroxysmal atrial fibrillation: Secondary | ICD-10-CM | POA: Diagnosis not present

## 2015-11-18 DIAGNOSIS — I429 Cardiomyopathy, unspecified: Secondary | ICD-10-CM

## 2015-11-18 DIAGNOSIS — I5022 Chronic systolic (congestive) heart failure: Secondary | ICD-10-CM

## 2015-11-18 DIAGNOSIS — I2583 Coronary atherosclerosis due to lipid rich plaque: Principal | ICD-10-CM

## 2015-11-18 DIAGNOSIS — I10 Essential (primary) hypertension: Secondary | ICD-10-CM

## 2015-11-18 DIAGNOSIS — I428 Other cardiomyopathies: Secondary | ICD-10-CM

## 2015-11-18 LAB — LIPID PANEL
Cholesterol: 123 mg/dL — ABNORMAL LOW (ref 125–200)
HDL: 43 mg/dL (ref >=40)
LDL CALC: 71 mg/dL (ref <130)
Total CHOL/HDL Ratio: 2.9 Ratio (ref <=5.0)
Triglycerides: 44 mg/dL (ref <150)
VLDL: 9 mg/dL (ref <30)

## 2015-11-18 LAB — BASIC METABOLIC PANEL
BUN: 26 mg/dL — AB (ref 7–25)
CHLORIDE: 103 mmol/L (ref 98–110)
CO2: 25 mmol/L (ref 20–31)
Calcium: 8.7 mg/dL (ref 8.6–10.3)
Creat: 1.63 mg/dL — ABNORMAL HIGH (ref 0.70–1.11)
GLUCOSE: 115 mg/dL — AB (ref 65–99)
POTASSIUM: 4.6 mmol/L (ref 3.5–5.3)
Sodium: 138 mmol/L (ref 135–146)

## 2015-11-18 LAB — ALT: ALT: 11 U/L (ref 9–46)

## 2015-11-18 NOTE — Progress Notes (Signed)
Cardiology Office Note    Date:  11/18/2015   ID:  [name] [name], DOB [birth year], MRN [id number]  PCP:  Laren Boom, DO  Cardiologist:  Armanda Magic, MD   Chief Complaint  Patient presents with  . Coronary Artery Disease  . Hypertension  . Atrial Fibrillation  . Hyperlipidemia  . Cardiomyopathy  . Congestive Heart Failure    History of Present Illness:  [name] [name] is a 82 y.o. male with a history of nonobstructive ASCAD, NICM by cath 2008 (EF 35-40% by echo 07/2014) with chronic CHF s/p Medtronic CRT-D, LBBB, CRI (baseline 1.7-1.8), HTN, PAF, OSA and dyslipidemia who presents for followup. He is followed in [facility] for NYHA class II-III CHF. He is doing much better. His weight had increased to 218 but is now back to 214lbs.  He denies any chest pain or pressure.  He denies any SOB, DOE,  LE edema, palpitations or syncope. He occasionally will have some dizziness when going from sitting to standing. He walks some for exercise.    Past Medical History  Diagnosis Date  . Nonischemic cardiomyopathy (HCC)     ef 10 %  . BPH (benign prostatic hypertrophy)   . Hypothyroidism   . Left bundle branch block   . Atrial fibrillation (HCC)     fibrillation/flutter  . Depression   . Sleep apnea     stopped using CPAP  . Hyperlipidemia   . Thrombocytopenia (HCC)   . Osteoarthritis   . Anemia, unspecified 04/07/2013  . Hypertension   . Coronary artery disease 2008    nonobstructive ASCAD  . Chronic systolic CHF (congestive heart failure) (HCC)     Nonischemic DCM EF 10% s/p BiV AICD  . OSA (obstructive sleep apnea)     intolerant to CPAP    Past Surgical History  Procedure Laterality Date  . Transurethral resection of prostate      for BPH  . Cholecystectomy    . Colonscopy      reportedly negative per pt; around 2005.    . Cardioversion N/A 12/29/2012    Procedure: CARDIOVERSION;  Surgeon: Quintella Reichert, MD;  Location: [facility];  Service:  Cardiovascular;  Laterality: N/A;  . Cardiac defibrillator placement      left chest    Current Medications: Outpatient Prescriptions Prior to Visit  Medication Sig Dispense Refill  . acetaminophen (TYLENOL) 650 MG CR tablet Take 1 tablet (650 mg total) by mouth every 8 (eight) hours as needed for pain. 90 tablet 3  . amiodarone (PACERONE) 200 MG tablet Take 0.5 tablets (100 mg total) by mouth daily. 45 tablet 1  . carvedilol (COREG) 3.125 MG tablet Take 1 tablet (3.125 mg total) by mouth 2 (two) times daily. 180 tablet 1  . DIGOX 125 MCG tablet Take 1 tablet by mouth  every other day 45 tablet 0  . gabapentin (NEURONTIN) 100 MG capsule Take 1 capsule (100 mg total) by mouth at bedtime as needed (neuropathy). 90 capsule 0  . levothyroxine (SYNTHROID, LEVOTHROID) 125 MCG tablet Take 1 tablet (125 mcg total) by mouth daily before breakfast. 90 tablet 1  . potassium chloride SA (K-DUR,KLOR-CON) 20 MEQ tablet Take 20 mEq by mouth as needed. When you take Torsemide    . ramipril (ALTACE) 2.5 MG capsule Take 1 capsule by mouth  daily 90 capsule 0  . simvastatin (ZOCOR) 10 MG tablet Take 1 tablet (10 mg total) by mouth every evening. 90 tablet 2  .  spironolactone (ALDACTONE) 25 MG tablet Take 0.5 tablets (12.5 mg total) by mouth daily. NEED FOLLOW UP APPOINTMENT FOR MORE REFILLS 45 tablet 0  . torsemide (DEMADEX) 20 MG tablet Take 20 mg by mouth as needed. As needed for weight 215 or greater. Take 20 mg    . warfarin (COUMADIN) 5 MG tablet Take 1 tablet by mouth once daily at 6PM 90 tablet 0   No facility-administered medications prior to visit.     Allergies:   Review of patient's allergies indicates no known allergies.   Social History   Social History  . Marital Status: Married    Spouse Name: N/A  . Number of Children: 4  . Years of Education: N/A   Occupational History  . retired     retired Optometrist; now in [employer], [employer]  .     Social History Main Topics  .  Smoking status: Never Smoker   . Smokeless tobacco: Never Used  . Alcohol Use: No  . Drug Use: No  . Sexual Activity: No   Other Topics Concern  . None   Social History Narrative     Family History:  The patient's family history includes Cancer in his maternal uncle; Heart disease in his father; Heart failure in his mother; Stroke in his mother. There is no history of Heart attack.   ROS:   Please see the history of present illness.    Review of Systems  Neurological: Positive for dizziness and loss of balance.   All other systems reviewed and are negative.   PHYSICAL EXAM:   VS:  BP 118/70 mmHg  Pulse 75  Ht 6\' 3"  (1.905 m)  Wt 214 lb 6.4 oz (97.251 kg)  BMI 26.80 kg/m2  SpO2 98%   GEN: Well nourished, well developed, in no acute distress HEENT: normal Neck: no JVD, carotid bruits, or masses Cardiac: RRR; no murmurs, rubs, or gallops,no edema.  Intact distal pulses bilaterally.  Respiratory:  clear to auscultation bilaterally, normal work of breathing GI: soft, nontender, nondistended, + BS MS: no deformity or atrophy Skin: warm and dry, no rash Neuro:  Alert and Oriented x 3, Strength and sensation are intact Psych: euthymic mood, full affect  Wt Readings from Last 3 Encounters:  11/18/15 214 lb 6.4 oz (97.251 kg)  11/11/15 218 lb 1.6 oz (98.93 kg)  08/24/15 218 lb (98.884 kg)      Studies/Labs Reviewed:   EKG:  EKG is not ordered today.    Recent Labs: 04/20/2015: ALT 13; BUN 15; Creat 1.29*; Potassium 4.1; Sodium 138 08/24/2015: TSH 1.27   Lipid Panel    Component Value Date/Time   CHOL 115* 04/20/2015 0933   CHOL 134 07/06/2014 1034   TRIG 69 04/20/2015 0933   TRIG 76 07/06/2014 1034   HDL 34* 04/20/2015 0933   HDL 44 07/06/2014 1034   CHOLHDL 3.4 04/20/2015 0933   VLDL 14 04/20/2015 0933   LDLCALC 67 04/20/2015 0933   LDLCALC 75 07/06/2014 1034    Additional studies/ records that were reviewed today include:  none    ASSESSMENT:    1.  Coronary artery disease due to lipid rich plaque   2. Nonischemic cardiomyopathy (HCC)   3. Chronic systolic heart failure (HCC)   4. Paroxysmal atrial fibrillation (HCC)   5. Essential hypertension   6. Hyperlipidemia      PLAN:  In order of problems listed above:  1. ASCAD with no angina.  Continue BB/statin.  No ASA  due to warfarin.  2. NIDCM - EF 35-40% 3. Chronic systolic CHF - Class II-III - he appears euvolemic today and weight is down.   continue ACE I/BB/torsemide/aldactone.   4. PAF - maintaining NSR on Amio/BB/Dig.  Continue warfarin for long term anticoagulation.  He is up to date on TSH and LFTS.  Check PFTs with DLCO. 5. HTN - BP controlled on current medical regimen.  Continue BB/ACE I.  Check BMET 6.         Hyperlipidemia - LDL goal < 70.  Check FLP and ALT.    Medication Adjustments/Labs and Tests Ordered: Current medicines are reviewed at length with the patient today.  Concerns regarding medicines are outlined above.  Medication changes, Labs and Tests ordered today are listed in the Patient Instructions below.  There are no Patient Instructions on file for this visit.   Signed, Armanda Magic, MD  11/18/2015 10:24 AM    [facility] [address] [place], [place], [place]  [address] Phone: [phone]; Fax: [phone]

## 2015-11-18 NOTE — Patient Instructions (Signed)
Medication Instructions:  Your physician recommends that you continue on your current medications as directed. Please refer to the Current Medication list given to you today.   Labwork: None  Testing/Procedures: Your physician has recommended that you have a pulmonary function test. Pulmonary Function Tests are a group of tests that measure how well air moves in and out of your lungs.  Follow-Up: Your physician wants you to follow-up in: 6 months with Dr. Mayford Knife. You will receive a reminder letter in the mail two months in advance. If you don't receive a letter, please call our office to schedule the follow-up appointment.   Any Other Special Instructions Will Be Listed Below (If Applicable).     If you need a refill on your cardiac medications before your next appointment, please call your pharmacy.

## 2015-11-25 ENCOUNTER — Ambulatory Visit (INDEPENDENT_AMBULATORY_CARE_PROVIDER_SITE_OTHER): Payer: Medicare Other | Admitting: Sports Medicine

## 2015-11-25 ENCOUNTER — Encounter: Payer: Self-pay | Admitting: Sports Medicine

## 2015-11-25 ENCOUNTER — Ambulatory Visit (INDEPENDENT_AMBULATORY_CARE_PROVIDER_SITE_OTHER): Payer: Medicare Other | Admitting: Family Medicine

## 2015-11-25 VITALS — BP 122/69 | HR 73

## 2015-11-25 VITALS — BP 122/69 | HR 73 | Resp 16 | Wt 214.0 lb

## 2015-11-25 DIAGNOSIS — M1612 Unilateral primary osteoarthritis, left hip: Secondary | ICD-10-CM | POA: Diagnosis not present

## 2015-11-25 DIAGNOSIS — I4891 Unspecified atrial fibrillation: Secondary | ICD-10-CM | POA: Diagnosis not present

## 2015-11-25 DIAGNOSIS — M170 Bilateral primary osteoarthritis of knee: Secondary | ICD-10-CM

## 2015-11-25 LAB — POCT INR: INR: 2.2

## 2015-11-25 NOTE — Assessment & Plan Note (Signed)
Visco supplementation did not provide much improvement. Aspiration without injection as above, aggressive home health physical therapy ordered.

## 2015-11-25 NOTE — Progress Notes (Signed)
INR is perfect keep taking 1+1/2 pills (7.5 mg) on Saturday and 5 mg on other days, return in 4 weeks to recheck

## 2015-11-25 NOTE — Assessment & Plan Note (Signed)
Left hip joint injection as above, return in one month.

## 2015-11-25 NOTE — Progress Notes (Signed)
  Subjective:    CC: Left knee and left hip pain  HPI: Left knee pain: No response to Visco supplementation, partial response to injection sometime ago. Having a recurrence of effusion would like this drained, only minimal pain right now.  Thigh pain: Left-sided, localized in the groin, worse with weightbearing, moderate, persistent. He does have some unsteadiness of gait and weakness with getting up from a seated position.  Past medical history, Surgical history, Family history not pertinant except as noted below, Social history, Allergies, and medications have been entered into the medical record, reviewed, and no changes needed.   Review of Systems: No fevers, chills, night sweats, weight loss, chest pain, or shortness of breath.   Objective:    General: Well Developed, well nourished, and in no acute distress.  Neuro: Alert and oriented x3, extra-ocular muscles intact, sensation grossly intact.  HEENT: Normocephalic, atraumatic, pupils equal round reactive to light, neck supple, no masses, no lymphadenopathy, thyroid nonpalpable.  Skin: Warm and dry, no rashes. Cardiac: Regular rate and rhythm, no murmurs rubs or gallops, no lower extremity edema.  Respiratory: Clear to auscultation bilaterally. Not using accessory muscles, speaking in full sentences. Left knee: Visibly swollen with a palpable effusion and fluid wave Left hip: Pain with internal rotation reproduced in the groin  Procedure: Real-time Ultrasound Guided aspiration/Injection of left hip joint Device: GE Logiq E  Verbal informed consent obtained.  Time-out conducted.  Noted no overlying erythema, induration, or other signs of local infection.  Skin prepped in a sterile fashion.  Local anesthesia: Topical Ethyl chloride.  With sterile technique and under real time ultrasound guidance:  Spinal needle advanced to the femoral head/neck junction, bone contacted and 1 mL kenalog 40, 2 mL lidocaine, 2 mL Marcaine injected  easily. Completed without difficulty  Pain immediately resolved suggesting accurate placement of the medication.  Advised to call if fevers/chills, erythema, induration, drainage, or persistent bleeding.  Images permanently stored and available for review in the ultrasound unit.  Impression: Technically successful ultrasound guided injection.  Procedure: Real-time Ultrasound Guided aspiration of left knee Device: GE Logiq E  Verbal informed consent obtained.  Time-out conducted.  Noted no overlying erythema, induration, or other signs of local infection.  Skin prepped in a sterile fashion.  Local anesthesia: Topical Ethyl chloride.  With sterile technique and under real time ultrasound guidance:  With an 18-gauge needle aspirated 29 mL straw-colored fluid. Completed without difficulty  Pain immediately resolved suggesting accurate placement of the medication.  Advised to call if fevers/chills, erythema, induration, drainage, or persistent bleeding.  Images permanently stored and available for review in the ultrasound unit.  Impression: Technically successful ultrasound guided injection.  Impression and Recommendations:

## 2015-11-25 NOTE — Progress Notes (Signed)
Patient advised of recommendations.  

## 2015-11-29 ENCOUNTER — Encounter: Payer: Self-pay | Admitting: Family Medicine

## 2015-11-29 ENCOUNTER — Other Ambulatory Visit: Payer: Self-pay | Admitting: Family Medicine

## 2015-11-29 ENCOUNTER — Ambulatory Visit (INDEPENDENT_AMBULATORY_CARE_PROVIDER_SITE_OTHER): Payer: Medicare Other | Admitting: Family Medicine

## 2015-11-29 VITALS — BP 115/72 | HR 80 | Wt 211.0 lb

## 2015-11-29 DIAGNOSIS — E039 Hypothyroidism, unspecified: Secondary | ICD-10-CM | POA: Diagnosis not present

## 2015-11-29 DIAGNOSIS — G609 Hereditary and idiopathic neuropathy, unspecified: Secondary | ICD-10-CM

## 2015-11-29 DIAGNOSIS — N2889 Other specified disorders of kidney and ureter: Secondary | ICD-10-CM

## 2015-11-29 DIAGNOSIS — Z0000 Encounter for general adult medical examination without abnormal findings: Secondary | ICD-10-CM

## 2015-11-29 DIAGNOSIS — N189 Chronic kidney disease, unspecified: Secondary | ICD-10-CM

## 2015-11-29 DIAGNOSIS — R739 Hyperglycemia, unspecified: Secondary | ICD-10-CM | POA: Diagnosis not present

## 2015-11-29 DIAGNOSIS — I4891 Unspecified atrial fibrillation: Secondary | ICD-10-CM | POA: Diagnosis not present

## 2015-11-29 DIAGNOSIS — N183 Chronic kidney disease, stage 3 unspecified: Secondary | ICD-10-CM

## 2015-11-29 DIAGNOSIS — R29818 Other symptoms and signs involving the nervous system: Secondary | ICD-10-CM

## 2015-11-29 DIAGNOSIS — R2689 Other abnormalities of gait and mobility: Secondary | ICD-10-CM

## 2015-11-29 LAB — CBC
HEMATOCRIT: 41.8 % (ref 38.5–50.0)
HEMOGLOBIN: 13.8 g/dL (ref 13.2–17.1)
MCH: 30.7 pg (ref 27.0–33.0)
MCHC: 33 g/dL (ref 32.0–36.0)
MCV: 93.1 fL (ref 80.0–100.0)
MPV: 10.7 fL (ref 7.5–12.5)
Platelets: 139 10*3/uL — ABNORMAL LOW (ref 140–400)
RBC: 4.49 MIL/uL (ref 4.20–5.80)
RDW: 14.1 % (ref 11.0–15.0)
WBC: 6.2 10*3/uL (ref 3.8–10.8)

## 2015-11-29 LAB — BASIC METABOLIC PANEL WITH GFR
BUN: 26 mg/dL — ABNORMAL HIGH (ref 7–25)
CALCIUM: 9.4 mg/dL (ref 8.6–10.3)
CHLORIDE: 101 mmol/L (ref 98–110)
CO2: 28 mmol/L (ref 20–31)
Creat: 1.24 mg/dL — ABNORMAL HIGH (ref 0.70–1.11)
GFR, EST NON AFRICAN AMERICAN: 54 mL/min — AB (ref >=60)
GFR, Est African American: 63 mL/min (ref >=60)
GLUCOSE: 156 mg/dL — AB (ref 65–99)
Potassium: 4.2 mmol/L (ref 3.5–5.3)
SODIUM: 138 mmol/L (ref 135–146)

## 2015-11-29 MED ORDER — GABAPENTIN 100 MG PO CAPS: 100.0000 mg | ORAL_CAPSULE | Freq: Every evening | ORAL | Status: DC | PRN

## 2015-11-29 NOTE — Progress Notes (Signed)
Subjective:    [name] [name] is a 82 y.o. male who presents for Medicare Annual/Subsequent preventive examination.   Preventive Screening-Counseling & Management  Tobacco History  Smoking status  . Never Smoker   Smokeless tobacco  . Never Used   Colonoscopy: No current indication Prostate: Discussed screening risks/beneifts with patient today, UTD managed by urology Influenza Vaccine: no current indication Pneumovax: Due for prevnar, strongly encouraged However patient politely declines Td/Tdap: UTD Zoster: Strongly encouraged today however patient politely declines  Dr. Mayford Knife needs BMP results  Problems Prior to Visit 1. AFib, HTN, Hypothyroid  Current Problems (verified) Patient Active Problem List   Diagnosis Date Noted  . Primary osteoarthritis of left hip 11/25/2015  . Lung crackles 11/17/2014  . Lung nodule 11/17/2014  . Primary osteoarthritis of both knees 11/03/2014  . Abnormal PFTs (pulmonary function tests) 04/07/2014  . BPH (benign prostatic hyperplasia) 01/13/2014  . Chronic renal insufficiency, stage III (moderate) 01/13/2014  . Chronic anticoagulation 04/09/2013  . HTN (hypertension) 04/09/2013  . CAD (coronary artery disease) 04/09/2013  . Anemia, unspecified 04/07/2013  . Chronic systolic heart failure (HCC) 02/17/2013  . Atrial tachycardia (HCC) 2020-11-1212  . Nonischemic cardiomyopathy (HCC)   . Left bundle branch block   . Thrombocytopenia (HCC)   . Biventricular implantable cardioverter-defibrillator-Medtronic 01/10/2011  . DYSPNEA 08/09/2010  . VENTRICULAR TACHYCARDIA 06/23/2009  . Hypothyroidism 06/22/2009  . Hyperlipidemia 06/22/2009  . DEPRESSION 06/22/2009  . FIBRILLATION, ATRIAL 06/22/2009  . OSA (obstructive sleep apnea) 06/22/2009    Medications Prior to Visit Current Outpatient Prescriptions on File Prior to Visit  Medication Sig Dispense Refill  . acetaminophen (TYLENOL) 650 MG CR tablet Take 1 tablet (650 mg total) by mouth  every 8 (eight) hours as needed for pain. 90 tablet 3  . amiodarone (PACERONE) 200 MG tablet Take 0.5 tablets (100 mg total) by mouth daily. 45 tablet 1  . carvedilol (COREG) 3.125 MG tablet Take 1 tablet (3.125 mg total) by mouth 2 (two) times daily. 180 tablet 1  . DIGOX 125 MCG tablet Take 1 tablet by mouth  every other day 45 tablet 0  . levothyroxine (SYNTHROID, LEVOTHROID) 125 MCG tablet Take 1 tablet (125 mcg total) by mouth daily before breakfast. 90 tablet 1  . potassium chloride SA (K-DUR,KLOR-CON) 20 MEQ tablet Take 20 mEq by mouth as needed. When you take Torsemide    . ramipril (ALTACE) 2.5 MG capsule Take 1 capsule by mouth  daily 90 capsule 0  . simvastatin (ZOCOR) 10 MG tablet Take 1 tablet (10 mg total) by mouth every evening. 90 tablet 2  . spironolactone (ALDACTONE) 25 MG tablet Take 0.5 tablets (12.5 mg total) by mouth daily. NEED FOLLOW UP APPOINTMENT FOR MORE REFILLS 45 tablet 0  . torsemide (DEMADEX) 20 MG tablet Take 20 mg by mouth as needed. As needed for weight 215 or greater. Take 20 mg    . warfarin (COUMADIN) 5 MG tablet Take 1 tablet by mouth once daily at 6PM 90 tablet 0   No current facility-administered medications on file prior to visit.    Current Medications (verified) Current Outpatient Prescriptions  Medication Sig Dispense Refill  . acetaminophen (TYLENOL) 650 MG CR tablet Take 1 tablet (650 mg total) by mouth every 8 (eight) hours as needed for pain. 90 tablet 3  . amiodarone (PACERONE) 200 MG tablet Take 0.5 tablets (100 mg total) by mouth daily. 45 tablet 1  . carvedilol (COREG) 3.125 MG tablet Take 1 tablet (3.125 mg total) by  mouth 2 (two) times daily. 180 tablet 1  . DIGOX 125 MCG tablet Take 1 tablet by mouth  every other day 45 tablet 0  . gabapentin (NEURONTIN) 100 MG capsule Take 1 capsule (100 mg total) by mouth at bedtime as needed (neuropathy). 90 capsule 0  . levothyroxine (SYNTHROID, LEVOTHROID) 125 MCG tablet Take 1 tablet (125 mcg total)  by mouth daily before breakfast. 90 tablet 1  . potassium chloride SA (K-DUR,KLOR-CON) 20 MEQ tablet Take 20 mEq by mouth as needed. When you take Torsemide    . ramipril (ALTACE) 2.5 MG capsule Take 1 capsule by mouth  daily 90 capsule 0  . simvastatin (ZOCOR) 10 MG tablet Take 1 tablet (10 mg total) by mouth every evening. 90 tablet 2  . spironolactone (ALDACTONE) 25 MG tablet Take 0.5 tablets (12.5 mg total) by mouth daily. NEED FOLLOW UP APPOINTMENT FOR MORE REFILLS 45 tablet 0  . torsemide (DEMADEX) 20 MG tablet Take 20 mg by mouth as needed. As needed for weight 215 or greater. Take 20 mg    . warfarin (COUMADIN) 5 MG tablet Take 1 tablet by mouth once daily at 6PM 90 tablet 0   No current facility-administered medications for this visit.     Allergies (verified) Review of patient's allergies indicates no known allergies.   PAST HISTORY  Family History Family History  Problem Relation Age of Onset  . Heart failure Mother   . Heart disease Father   . Cancer Maternal Uncle     Unknown subtype  . Stroke Mother   . Heart attack Neg Hx     Social History Social History  Substance Use Topics  . Smoking status: Never Smoker   . Smokeless tobacco: Never Used  . Alcohol Use: No    Are there smokers in your home (other than you)?  No  Risk Factors Current exercise habits: The patient does not participate in regular exercise at present.  Dietary issues discussed: DASH   Cardiac risk factors: advanced age (older than 5 for men, 11 for women), hypertension and sedentary lifestyle.  Depression Screen (Note: if answer to either of the following is "Yes", a more complete depression screening is indicated)   Q1: Over the past two weeks, have you felt down, depressed or hopeless? No  Q2: Over the past two weeks, have you felt little interest or pleasure in doing things? No  Have you lost interest or pleasure in daily life? No  Do you often feel hopeless? No  Do you cry easily  over simple problems? No  Activities of Daily Living In your present state of health, do you have any difficulty performing the following activities?:  Driving? No Managing money?  No Feeding yourself? No Getting from bed to chair? No Climbing a flight of stairs? No Preparing food and eating?: No Bathing or showering? No Getting dressed: No Getting to the toilet? No Using the toilet:No Moving around from place to place: No In the past year have you fallen or had a near fall?:Yes   Are you sexually active?  No  Do you have more than one partner?  No  Hearing Difficulties: No Do you often ask people to speak up or repeat themselves? No Do you experience ringing or noises in your ears? No Do you have difficulty understanding soft or whispered voices? No   Do you feel that you have a problem with memory? No  Do you often misplace items? No  Do you feel safe  at home?  No  Cognitive Testing  Alert? Yes  Normal Appearance?Yes  Oriented to person? Yes  Place? Yes   Time? Yes  Recall of three objects?  Yes  Can perform simple calculations? Yes  Displays appropriate judgment?Yes  Can read the correct time from a watch face?Yes   Advanced Directives have been discussed with the patient? Yes   List the Names of Other Physician/Practitioners you currently use: 1.  Turner and T.  Indicate any recent Medical Services you may have received from other than [facility] providers in the past year (date may be approximate).  Immunization History  Administered Date(s) Administered  . Influenza,inj,Quad PF,36+ Mos 03/29/2014, 04/13/2015  . Tdap 06/30/2009    Screening Tests Health Maintenance  Topic Date Due  . ZOSTAVAX  05/28/1994  . PNA vac Low Risk Adult (1 of 2 - PCV13) 05/29/1999  . INFLUENZA VACCINE  01/24/2016  . TETANUS/TDAP  07/01/2019    All answers were reviewed with the patient and necessary referrals were made:  Laren Boom, DO   11/29/2015   History reviewed:  allergies, current medications, past family history, past medical history, past social history, past surgical history and problem list  Review of Systems Review of Systems - General ROS: negative for - chills, fever, night sweats, weight gain or weight loss Ophthalmic ROS: negative for - decreased vision Psychological ROS: negative for - anxiety or depression ENT ROS: negative for - hearing change, nasal congestion, tinnitus or allergies Hematological and Lymphatic ROS: negative for - bleeding problems, bruising or swollen lymph nodes Breast ROS: negative Respiratory ROS: no cough, shortness of breath, or wheezing Cardiovascular ROS: no chest pain or dyspnea on exertion Gastrointestinal ROS: no abdominal pain, change in bowel habits, or black or bloody stools Genito-Urinary ROS: negative for - genital discharge, genital ulcers, incontinence or abnormal bleeding from genitals Musculoskeletal ROS: negative for - joint pain or muscle pain Neurological ROS: negative for - headaches or memory loss Dermatological ROS: negative for lumps, mole changes, rash and skin lesion changes   Objective:     Vision by Snellen chart: right eye:20/30, left eye:20/40 Blood pressure 115/72, pulse 80, weight 211 lb (95.709 kg). Body mass index is 26.37 kg/(m^2).  General: No Acute Distress HEENT: Atraumatic, normocephalic, conjunctivae normal without scleral icterus.  No nasal discharge, hearing grossly intact, TMs with good landmarks bilaterally with no middle ear abnormalities, posterior pharynx clear without oral lesions. Neck: Supple, trachea midline, no cervical nor supraclavicular adenopathy. Pulmonary: Clear to auscultation bilaterally without wheezing, rhonchi, nor rales. Cardiac: Regular rate and rhythm.  No murmurs, rubs, nor gallops. No peripheral edema.  2+ peripheral pulses bilaterally. Abdomen: Bowel sounds normal.  No masses.  Non-tender without rebound.  Negative Murphy's sign. MSK: Grossly  intact, no signs of weakness.  Full strength throughout upper and lower extremities.  Full ROM in upper and lower extremities.  No midline spinal tenderness. Neuro: Gait unremarkable, CN II-XII grossly intact.  C5-C6 Reflex 2/4 Bilaterally, L4 Reflex 2/4 Bilaterally.  Cerebellar function intact. Skin: No rashes. Psych: Alert and oriented to person/place/time.  Thought process normal. No anxiety/depression.     Assessment:     Balance problems, referral has been placed for home health physical therapy. Overdue for Prevnar and Zostavax however he declines today. Otherwise unremarkable exam.     Plan:     During the course of the visit the patient was educated and counseled about appropriate screening and preventive services including:    Pneumococcal vaccine  zostavax  Diet review for nutrition referral?Not Indicated ____   Patient Instructions (the written plan) was given to the patient.  Medicare Attestation I have personally reviewed: The patient's medical and social history Their use of alcohol, tobacco or illicit drugs Their current medications and supplements The patient's functional ability including ADLs,fall risks, home safety risks, cognitive, and hearing and visual impairment Diet and physical activities Evidence for depression or mood disorders  The patient's weight, height, BMI, and visual acuity have been recorded in the chart.  I have made referrals, counseling, and provided education to the patient based on review of the above and I have provided the patient with a written personalized care plan for preventive services.     Laren Boom, DO   11/29/2015   TSH and lipid panel were obtained within the last 6 months, he's due for a CBC and Dr. Mayford Knife has requested a basic metabolic panel which we'll try to remember to forward to her when results are available.

## 2015-11-30 ENCOUNTER — Encounter: Payer: Self-pay | Admitting: Family Medicine

## 2015-11-30 LAB — HEMOGLOBIN A1C
Hgb A1c MFr Bld: 6.6 % — ABNORMAL HIGH (ref <5.7)
MEAN PLASMA GLUCOSE: 143 mg/dL

## 2015-12-05 DIAGNOSIS — E785 Hyperlipidemia, unspecified: Secondary | ICD-10-CM | POA: Diagnosis not present

## 2015-12-05 DIAGNOSIS — I4891 Unspecified atrial fibrillation: Secondary | ICD-10-CM | POA: Diagnosis not present

## 2015-12-05 DIAGNOSIS — G4730 Sleep apnea, unspecified: Secondary | ICD-10-CM | POA: Diagnosis not present

## 2015-12-05 DIAGNOSIS — R29818 Other symptoms and signs involving the nervous system: Secondary | ICD-10-CM | POA: Diagnosis not present

## 2015-12-05 DIAGNOSIS — M1612 Unilateral primary osteoarthritis, left hip: Secondary | ICD-10-CM | POA: Diagnosis not present

## 2015-12-05 DIAGNOSIS — R2689 Other abnormalities of gait and mobility: Secondary | ICD-10-CM | POA: Diagnosis not present

## 2015-12-05 DIAGNOSIS — M6281 Muscle weakness (generalized): Secondary | ICD-10-CM | POA: Diagnosis not present

## 2015-12-05 DIAGNOSIS — I509 Heart failure, unspecified: Secondary | ICD-10-CM | POA: Diagnosis not present

## 2015-12-05 DIAGNOSIS — N189 Chronic kidney disease, unspecified: Secondary | ICD-10-CM | POA: Diagnosis not present

## 2015-12-06 ENCOUNTER — Ambulatory Visit (INDEPENDENT_AMBULATORY_CARE_PROVIDER_SITE_OTHER): Payer: Medicare Other | Admitting: *Deleted

## 2015-12-06 DIAGNOSIS — I428 Other cardiomyopathies: Secondary | ICD-10-CM

## 2015-12-06 DIAGNOSIS — I429 Cardiomyopathy, unspecified: Secondary | ICD-10-CM

## 2015-12-06 NOTE — Progress Notes (Signed)
Remote ICD transmission.   

## 2015-12-08 DIAGNOSIS — I4891 Unspecified atrial fibrillation: Secondary | ICD-10-CM | POA: Diagnosis not present

## 2015-12-08 DIAGNOSIS — E785 Hyperlipidemia, unspecified: Secondary | ICD-10-CM | POA: Diagnosis not present

## 2015-12-08 DIAGNOSIS — M1612 Unilateral primary osteoarthritis, left hip: Secondary | ICD-10-CM | POA: Diagnosis not present

## 2015-12-08 DIAGNOSIS — R29818 Other symptoms and signs involving the nervous system: Secondary | ICD-10-CM | POA: Diagnosis not present

## 2015-12-08 DIAGNOSIS — N189 Chronic kidney disease, unspecified: Secondary | ICD-10-CM | POA: Diagnosis not present

## 2015-12-08 DIAGNOSIS — I509 Heart failure, unspecified: Secondary | ICD-10-CM | POA: Diagnosis not present

## 2015-12-08 DIAGNOSIS — M6281 Muscle weakness (generalized): Secondary | ICD-10-CM | POA: Diagnosis not present

## 2015-12-08 DIAGNOSIS — R2689 Other abnormalities of gait and mobility: Secondary | ICD-10-CM | POA: Diagnosis not present

## 2015-12-08 DIAGNOSIS — G4730 Sleep apnea, unspecified: Secondary | ICD-10-CM | POA: Diagnosis not present

## 2015-12-09 DIAGNOSIS — R2689 Other abnormalities of gait and mobility: Secondary | ICD-10-CM | POA: Diagnosis not present

## 2015-12-09 DIAGNOSIS — E785 Hyperlipidemia, unspecified: Secondary | ICD-10-CM | POA: Diagnosis not present

## 2015-12-09 DIAGNOSIS — I509 Heart failure, unspecified: Secondary | ICD-10-CM | POA: Diagnosis not present

## 2015-12-09 DIAGNOSIS — I4891 Unspecified atrial fibrillation: Secondary | ICD-10-CM | POA: Diagnosis not present

## 2015-12-09 DIAGNOSIS — R29818 Other symptoms and signs involving the nervous system: Secondary | ICD-10-CM | POA: Diagnosis not present

## 2015-12-09 DIAGNOSIS — G4730 Sleep apnea, unspecified: Secondary | ICD-10-CM | POA: Diagnosis not present

## 2015-12-09 DIAGNOSIS — M1612 Unilateral primary osteoarthritis, left hip: Secondary | ICD-10-CM | POA: Diagnosis not present

## 2015-12-09 DIAGNOSIS — N189 Chronic kidney disease, unspecified: Secondary | ICD-10-CM | POA: Diagnosis not present

## 2015-12-09 DIAGNOSIS — M6281 Muscle weakness (generalized): Secondary | ICD-10-CM | POA: Diagnosis not present

## 2015-12-13 LAB — CUP PACEART REMOTE DEVICE CHECK
Battery Voltage: 2.64 V
Brady Statistic AP VP Percent: 90.69 %
Brady Statistic AS VS Percent: 0.22 %
Brady Statistic RV Percent Paced: 99.65 %
HIGH POWER IMPEDANCE MEASURED VALUE: 380 Ohm
HighPow Impedance: 42 Ohm
HighPow Impedance: 52 Ohm
Implantable Lead Implant Date: 20080806
Implantable Lead Implant Date: 20080806
Implantable Lead Location: 753858
Implantable Lead Location: 753859
Implantable Lead Model: 4076
Implantable Lead Model: 6947
Lead Channel Impedance Value: 456 Ohm
Lead Channel Impedance Value: 532 Ohm
Lead Channel Pacing Threshold Amplitude: 0.625 V
Lead Channel Pacing Threshold Pulse Width: 0.4 ms
Lead Channel Pacing Threshold Pulse Width: 0.4 ms
Lead Channel Sensing Intrinsic Amplitude: 0.625 mV
Lead Channel Sensing Intrinsic Amplitude: 0.625 mV
Lead Channel Setting Pacing Amplitude: 2 V
Lead Channel Setting Pacing Pulse Width: 0.4 ms
Lead Channel Setting Sensing Sensitivity: 0.3 mV
MDC IDC LEAD IMPLANT DT: 20080806
MDC IDC LEAD LOCATION: 753860
MDC IDC MSMT LEADCHNL LV IMPEDANCE VALUE: 1026 Ohm
MDC IDC MSMT LEADCHNL LV IMPEDANCE VALUE: 646 Ohm
MDC IDC MSMT LEADCHNL LV PACING THRESHOLD AMPLITUDE: 1.5 V
MDC IDC MSMT LEADCHNL LV PACING THRESHOLD PULSEWIDTH: 0.4 ms
MDC IDC MSMT LEADCHNL RA IMPEDANCE VALUE: 380 Ohm
MDC IDC MSMT LEADCHNL RV PACING THRESHOLD AMPLITUDE: 0.625 V
MDC IDC MSMT LEADCHNL RV SENSING INTR AMPL: 8.75 mV
MDC IDC MSMT LEADCHNL RV SENSING INTR AMPL: 8.75 mV
MDC IDC SESS DTM: 20170613083728
MDC IDC SET LEADCHNL LV PACING AMPLITUDE: 2.5 V
MDC IDC SET LEADCHNL LV PACING PULSEWIDTH: 0.4 ms
MDC IDC SET LEADCHNL RV PACING AMPLITUDE: 2.5 V
MDC IDC STAT BRADY AP VS PERCENT: 0.13 %
MDC IDC STAT BRADY AS VP PERCENT: 8.96 %
MDC IDC STAT BRADY RA PERCENT PACED: 90.82 %

## 2015-12-14 ENCOUNTER — Encounter: Payer: Self-pay | Admitting: Cardiology

## 2015-12-15 DIAGNOSIS — M1612 Unilateral primary osteoarthritis, left hip: Secondary | ICD-10-CM | POA: Diagnosis not present

## 2015-12-15 DIAGNOSIS — G4730 Sleep apnea, unspecified: Secondary | ICD-10-CM | POA: Diagnosis not present

## 2015-12-15 DIAGNOSIS — N189 Chronic kidney disease, unspecified: Secondary | ICD-10-CM | POA: Diagnosis not present

## 2015-12-15 DIAGNOSIS — M6281 Muscle weakness (generalized): Secondary | ICD-10-CM | POA: Diagnosis not present

## 2015-12-15 DIAGNOSIS — I509 Heart failure, unspecified: Secondary | ICD-10-CM | POA: Diagnosis not present

## 2015-12-15 DIAGNOSIS — I4891 Unspecified atrial fibrillation: Secondary | ICD-10-CM | POA: Diagnosis not present

## 2015-12-15 DIAGNOSIS — E785 Hyperlipidemia, unspecified: Secondary | ICD-10-CM | POA: Diagnosis not present

## 2015-12-15 DIAGNOSIS — R29818 Other symptoms and signs involving the nervous system: Secondary | ICD-10-CM | POA: Diagnosis not present

## 2015-12-15 DIAGNOSIS — R2689 Other abnormalities of gait and mobility: Secondary | ICD-10-CM | POA: Diagnosis not present

## 2015-12-28 ENCOUNTER — Ambulatory Visit (INDEPENDENT_AMBULATORY_CARE_PROVIDER_SITE_OTHER): Payer: Medicare Other | Admitting: Family Medicine

## 2015-12-28 DIAGNOSIS — I482 Chronic atrial fibrillation: Secondary | ICD-10-CM

## 2015-12-28 LAB — POCT INR: INR: 2.3

## 2015-12-28 NOTE — Progress Notes (Signed)
   Subjective:    Patient ID: [name] [name], male    DOB: 01-10-1932, 82 y.o.   MRN: [id number]  HPI Pt here for INR.  INR 2.3   Review of Systems     Objective:   Physical Exam        Assessment & Plan:  Notified MD regarding INR.  Will notify patient if changes in medication.

## 2015-12-31 ENCOUNTER — Encounter: Payer: Self-pay | Admitting: Family Medicine

## 2016-01-12 ENCOUNTER — Ambulatory Visit (INDEPENDENT_AMBULATORY_CARE_PROVIDER_SITE_OTHER): Payer: Medicare Other | Admitting: Internal Medicine

## 2016-01-12 DIAGNOSIS — I480 Paroxysmal atrial fibrillation: Secondary | ICD-10-CM

## 2016-01-12 LAB — PULMONARY FUNCTION TEST
DL/VA % pred: 103 %
DL/VA: 4.97 ml/min/mmHg/L
DLCO UNC: 25.22 ml/min/mmHg
DLCO unc % pred: 66 %
FEF 25-75 PRE: 1.64 L/s
FEF 25-75 Post: 1.97 L/sec
FEF2575-%Change-Post: 20 %
FEF2575-%Pred-Post: 86 %
FEF2575-%Pred-Pre: 71 %
FEV1-%Change-Post: 4 %
FEV1-%PRED-PRE: 81 %
FEV1-%Pred-Post: 85 %
FEV1-POST: 2.83 L
FEV1-Pre: 2.71 L
FEV1FVC-%CHANGE-POST: 5 %
FEV1FVC-%Pred-Pre: 96 %
FEV6-%CHANGE-POST: 0 %
FEV6-%PRED-POST: 90 %
FEV6-%PRED-PRE: 89 %
FEV6-POST: 3.93 L
FEV6-PRE: 3.91 L
FEV6FVC-%CHANGE-POST: 1 %
FEV6FVC-%PRED-PRE: 104 %
FEV6FVC-%Pred-Post: 106 %
FVC-%Change-Post: 0 %
FVC-%PRED-POST: 84 %
FVC-%Pred-Pre: 85 %
FVC-PRE: 3.97 L
FVC-Post: 3.94 L
POST FEV6/FVC RATIO: 100 %
Post FEV1/FVC ratio: 72 %
Pre FEV1/FVC ratio: 68 %
Pre FEV6/FVC Ratio: 99 %
RV % PRED: 88 %
RV: 2.57 L
TLC % PRED: 83 %
TLC: 6.54 L

## 2016-01-12 NOTE — Progress Notes (Signed)
PFT done today. 

## 2016-01-17 ENCOUNTER — Other Ambulatory Visit: Payer: Self-pay | Admitting: Family Medicine

## 2016-01-17 DIAGNOSIS — G609 Hereditary and idiopathic neuropathy, unspecified: Secondary | ICD-10-CM

## 2016-01-24 ENCOUNTER — Telehealth: Payer: Self-pay

## 2016-01-24 DIAGNOSIS — I4891 Unspecified atrial fibrillation: Secondary | ICD-10-CM

## 2016-01-24 NOTE — Telephone Encounter (Signed)
-----   Message from Quintella Reichert, MD sent at 01/13/2016 11:51 AM EDT ----- Stable PFTs - repeat in 1 year

## 2016-01-24 NOTE — Telephone Encounter (Signed)
Repeat PFTs ordered to be scheduled in 1 year.

## 2016-01-26 ENCOUNTER — Ambulatory Visit (INDEPENDENT_AMBULATORY_CARE_PROVIDER_SITE_OTHER): Payer: Medicare Other | Admitting: Osteopathic Medicine

## 2016-01-26 ENCOUNTER — Encounter: Payer: Self-pay | Admitting: Osteopathic Medicine

## 2016-01-26 VITALS — BP 113/66 | HR 93 | Ht 75.0 in | Wt 218.0 lb

## 2016-01-26 DIAGNOSIS — M170 Bilateral primary osteoarthritis of knee: Secondary | ICD-10-CM | POA: Diagnosis not present

## 2016-01-26 DIAGNOSIS — I2583 Coronary atherosclerosis due to lipid rich plaque: Secondary | ICD-10-CM

## 2016-01-26 DIAGNOSIS — E039 Hypothyroidism, unspecified: Secondary | ICD-10-CM

## 2016-01-26 DIAGNOSIS — R0609 Other forms of dyspnea: Secondary | ICD-10-CM

## 2016-01-26 DIAGNOSIS — I429 Cardiomyopathy, unspecified: Secondary | ICD-10-CM | POA: Diagnosis not present

## 2016-01-26 DIAGNOSIS — N400 Enlarged prostate without lower urinary tract symptoms: Secondary | ICD-10-CM

## 2016-01-26 DIAGNOSIS — I428 Other cardiomyopathies: Secondary | ICD-10-CM

## 2016-01-26 DIAGNOSIS — I5022 Chronic systolic (congestive) heart failure: Secondary | ICD-10-CM

## 2016-01-26 DIAGNOSIS — I2510 Atherosclerotic heart disease of native coronary artery without angina pectoris: Secondary | ICD-10-CM

## 2016-01-26 NOTE — Progress Notes (Signed)
HPI: [name] [name] is a 82 y.o. Not Hispanic or Latino male  who presents to [facility] [place] today, 01/26/16,  for chief complaint of:  Chief Complaint  Patient presents with  . Urinary Incontinence    Patient has a problem with leakage     CARDIOVASCULAR - DOE as below. No LE sweling, no chest pain. Echo 07/28/2014 EF 35-45% (improved from previous in 01/23/2013)   RESPIRATORY - dyspnea on exertion, stable PFT (cardio is monitoring him in Amiodarone) q year. Worse then it was few months ago.   NEUROLOGICAL - no dizziness.   URINARY - s/p TURP 2004, better emptying bladder  ENDOCRINE - A1C 6.6%   MUSCULOSKELETAL/RHEUM - increasing leg pain. Fell 2 weeks ago. Was undergoing PT for knee pain - Dr T ordered this, also instructed to address balance/gait/strengthening.     Past medical, surgical, social and family history reviewed: Past Medical History:  Diagnosis Date  . Anemia, unspecified 04/07/2013  . Atrial fibrillation (HCC)    fibrillation/flutter  . BPH (benign prostatic hypertrophy)   . Chronic systolic CHF (congestive heart failure) (HCC)    Nonischemic DCM EF 10% s/p BiV AICD  . Coronary artery disease 2008   nonobstructive ASCAD  . Depression   . Hyperlipidemia   . Hypertension   . Hypothyroidism   . Left bundle branch block   . Nonischemic cardiomyopathy (HCC)    ef 10 %  . OSA (obstructive sleep apnea)    intolerant to CPAP  . Osteoarthritis   . Sleep apnea    stopped using CPAP  . Thrombocytopenia (HCC)    Past Surgical History:  Procedure Laterality Date  . CARDIAC DEFIBRILLATOR PLACEMENT     left chest  . CARDIOVERSION N/A 12/29/2012   Procedure: CARDIOVERSION;  Surgeon: Quintella Reichert, MD;  Location: [facility];  Service: Cardiovascular;  Laterality: N/A;  . CHOLECYSTECTOMY    . Colonscopy     reportedly negative per pt; around 2005.    Marland Kitchen TRANSURETHRAL RESECTION OF PROSTATE     for BPH   Social History   Substance Use Topics  . Smoking status: Never Smoker  . Smokeless tobacco: Never Used  . Alcohol use No   Family History  Problem Relation Age of Onset  . Heart failure Mother   . Heart disease Father   . Cancer Maternal Uncle     Unknown subtype  . Stroke Mother   . Heart attack Neg Hx      Current medication list and allergy/intolerance information reviewed:   Current Outpatient Prescriptions  Medication Sig Dispense Refill  . acetaminophen (TYLENOL) 650 MG CR tablet Take 1 tablet (650 mg total) by mouth every 8 (eight) hours as needed for pain. 90 tablet 3  . amiodarone (PACERONE) 200 MG tablet Take 0.5 tablets (100 mg total) by mouth daily. 45 tablet 1  . carvedilol (COREG) 3.125 MG tablet Take 1 tablet (3.125 mg total) by mouth 2 (two) times daily. 180 tablet 1  . DIGOX 125 MCG tablet Take 1 tablet by mouth  every other day 45 tablet 0  . gabapentin (NEURONTIN) 100 MG capsule Take 1 capsule (100 mg total) by mouth at bedtime as needed (neuropathy). 90 capsule 0  . levothyroxine (SYNTHROID, LEVOTHROID) 125 MCG tablet Take 1 tablet (125 mcg total) by mouth daily before breakfast. 90 tablet 1  . potassium chloride SA (K-DUR,KLOR-CON) 20 MEQ tablet Take 20 mEq by mouth as needed. When you take Torsemide    .  ramipril (ALTACE) 2.5 MG capsule Take 1 capsule by mouth  daily 90 capsule 0  . simvastatin (ZOCOR) 10 MG tablet Take 1 tablet (10 mg total) by mouth every evening. 90 tablet 2  . spironolactone (ALDACTONE) 25 MG tablet Take 0.5 tablets (12.5 mg total) by mouth daily. NEED FOLLOW UP APPOINTMENT FOR MORE REFILLS 45 tablet 0  . torsemide (DEMADEX) 20 MG tablet Take 20 mg by mouth as needed. As needed for weight 215 or greater. Take 20 mg    . warfarin (COUMADIN) 5 MG tablet Take 1 tablet by mouth once daily at 6PM 90 tablet 0   No current facility-administered medications for this visit.    No Known Allergies    Review of Systems: Constitutional:  No  fever, no chills, No  recent illness, No unintentional weight changes.  HEENT: No  headache, no vision change palpitations, No  Orthopnea Respiratory:  (+) shortness of breath. No  Cough, no chest pain Gastrointestinal: No  abdominal pain, No  nausea Musculoskeletal: No new myalgia/arthralgia Genitourinary: (+) incontinence, No  abnormal genital bleeding, No abnormal genital discharge Neurologic: (+) generalized weakness, No  dizziness   Exam:  BP 113/66   Pulse 93   Ht 6\' 3"  (1.905 m)   Wt 218 lb (98.9 kg)   BMI 27.25 kg/m  Constitutional: VS see above. General Appearance: alert, well-developed, well-nourished, NAD Ears, Nose, Mouth, Throat: MMM Neck:No masses, trachea midline. Respiratory: Normal respiratory effort. no wheeze, no rhonchi, no rales Cardiovascular: S1/S2 normal, RRR. No lower extremity edema. Pedal pulse II/IV bilaterally DP and PT. No JVD. Musculoskeletal: Gait normal. No clubbing/cyanosis of digits.  Neurological: Normal balance/coordination. No tremor.   Psychiatric: Normal judgment/insight. Normal mood and affect. Oriented x3.      ASSESSMENT/PLAN:   Dyspnea on exertion - Plan: ECHOCARDIOGRAM COMPLETE - previous echo reviewed 07/28/2014, EF 35-40%, diffuse hypokinesis, unable to determine LV diastolic dysfunction, moderate mitral regurg, left atrium severely dilated. Patient states he discussed some dyspnea issues with his cardiologist at last visit, they did not seem too concerned that he seems to be getting worse, we'll go ahead and repeat echocardiogram at this point. Recent CBC 11/29/2015 no anemia, consider lab workup if no improvement/if echo stable  Nonischemic cardiomyopathy (HCC) - echo pending as noted above  BPH (benign prostatic hyperplasia) - status post TURP, occasional leakage when standing up from seated position, reviewed timed voiding and kegels, doesn't sound like urge incontinence that would benefit from medications at this point, consider urology follow-up if  symptoms worsen  Primary osteoarthritis of both knees - following with Dr. Karie Schwalbe  Hypothyroidism, unspecified hypothyroidism type - TSH 5 months ago within normal range  Coronary artery disease due to lipid rich plaque - following with cardiology  Chronic systolic heart failure (HCC) - see above, repeat echo pending     Visit summary with medication list and pertinent instructions was printed for patient to review. All questions at time of visit were answered - patient instructed to contact office with any additional concerns. ER/RTC precautions were reviewed with the patient. Follow-up plan: No Follow-up on file.  Note: High-risk patient with multiple comorbidities. Total time spent 40 minutes, greater than 50% of the visit was spent face-to-face counseling and coordinating care for the following: The primary encounter diagnosis was Dyspnea on exertion. Diagnoses of Nonischemic cardiomyopathy (HCC), BPH (benign prostatic hyperplasia), Primary osteoarthritis of both knees, Hypothyroidism, unspecified hypothyroidism type, Coronary artery disease due to lipid rich plaque, and Chronic systolic heart failure (HCC)  were also pertinent to this visit.Marland Kitchen

## 2016-01-31 ENCOUNTER — Ambulatory Visit (INDEPENDENT_AMBULATORY_CARE_PROVIDER_SITE_OTHER): Payer: Medicare Other | Admitting: Osteopathic Medicine

## 2016-01-31 DIAGNOSIS — I4891 Unspecified atrial fibrillation: Secondary | ICD-10-CM | POA: Diagnosis not present

## 2016-01-31 LAB — POCT INR: INR: 3.1

## 2016-01-31 NOTE — Progress Notes (Signed)
Pt advised of PCP recommendation and follow up scheduled.

## 2016-02-04 ENCOUNTER — Encounter (HOSPITAL_BASED_OUTPATIENT_CLINIC_OR_DEPARTMENT_OTHER): Payer: Self-pay | Admitting: *Deleted

## 2016-02-04 ENCOUNTER — Observation Stay (HOSPITAL_BASED_OUTPATIENT_CLINIC_OR_DEPARTMENT_OTHER)
Admission: EM | Admit: 2016-02-04 | Discharge: 2016-02-05 | Disposition: A | Discharge status: Home / Self Care | Payer: Medicare Other | Attending: Cardiology | Admitting: Cardiology

## 2016-02-04 ENCOUNTER — Emergency Department (HOSPITAL_BASED_OUTPATIENT_CLINIC_OR_DEPARTMENT_OTHER): Payer: Medicare Other

## 2016-02-04 DIAGNOSIS — N189 Chronic kidney disease, unspecified: Secondary | ICD-10-CM

## 2016-02-04 DIAGNOSIS — I5022 Chronic systolic (congestive) heart failure: Secondary | ICD-10-CM | POA: Diagnosis not present

## 2016-02-04 DIAGNOSIS — E039 Hypothyroidism, unspecified: Secondary | ICD-10-CM | POA: Diagnosis not present

## 2016-02-04 DIAGNOSIS — I130 Hypertensive heart and chronic kidney disease with heart failure and stage 1 through stage 4 chronic kidney disease, or unspecified chronic kidney disease: Secondary | ICD-10-CM | POA: Insufficient documentation

## 2016-02-04 DIAGNOSIS — Z95810 Presence of automatic (implantable) cardiac defibrillator: Secondary | ICD-10-CM | POA: Diagnosis not present

## 2016-02-04 DIAGNOSIS — N183 Chronic kidney disease, stage 3 (moderate): Secondary | ICD-10-CM | POA: Insufficient documentation

## 2016-02-04 DIAGNOSIS — Z7901 Long term (current) use of anticoagulants: Secondary | ICD-10-CM | POA: Diagnosis not present

## 2016-02-04 DIAGNOSIS — R000 Tachycardia, unspecified: Secondary | ICD-10-CM | POA: Diagnosis present

## 2016-02-04 DIAGNOSIS — I471 Supraventricular tachycardia: Secondary | ICD-10-CM | POA: Diagnosis not present

## 2016-02-04 DIAGNOSIS — E785 Hyperlipidemia, unspecified: Secondary | ICD-10-CM | POA: Insufficient documentation

## 2016-02-04 DIAGNOSIS — G4733 Obstructive sleep apnea (adult) (pediatric): Secondary | ICD-10-CM | POA: Diagnosis not present

## 2016-02-04 DIAGNOSIS — Z79899 Other long term (current) drug therapy: Secondary | ICD-10-CM | POA: Insufficient documentation

## 2016-02-04 DIAGNOSIS — I2510 Atherosclerotic heart disease of native coronary artery without angina pectoris: Secondary | ICD-10-CM

## 2016-02-04 DIAGNOSIS — K819 Cholecystitis, unspecified: Secondary | ICD-10-CM | POA: Insufficient documentation

## 2016-02-04 DIAGNOSIS — E1169 Type 2 diabetes mellitus with other specified complication: Secondary | ICD-10-CM | POA: Diagnosis present

## 2016-02-04 DIAGNOSIS — I428 Other cardiomyopathies: Secondary | ICD-10-CM | POA: Insufficient documentation

## 2016-02-04 DIAGNOSIS — I25708 Atherosclerosis of coronary artery bypass graft(s), unspecified, with other forms of angina pectoris: Secondary | ICD-10-CM | POA: Diagnosis present

## 2016-02-04 DIAGNOSIS — I480 Paroxysmal atrial fibrillation: Secondary | ICD-10-CM | POA: Diagnosis not present

## 2016-02-04 DIAGNOSIS — I10 Essential (primary) hypertension: Secondary | ICD-10-CM

## 2016-02-04 DIAGNOSIS — T829XXA Unspecified complication of cardiac and vascular prosthetic device, implant and graft, initial encounter: Secondary | ICD-10-CM

## 2016-02-04 DIAGNOSIS — E1159 Type 2 diabetes mellitus with other circulatory complications: Secondary | ICD-10-CM | POA: Diagnosis present

## 2016-02-04 DIAGNOSIS — I129 Hypertensive chronic kidney disease with stage 1 through stage 4 chronic kidney disease, or unspecified chronic kidney disease: Secondary | ICD-10-CM | POA: Diagnosis present

## 2016-02-04 DIAGNOSIS — I4811 Longstanding persistent atrial fibrillation: Secondary | ICD-10-CM | POA: Diagnosis present

## 2016-02-04 LAB — COMPREHENSIVE METABOLIC PANEL
ALK PHOS: 73 U/L (ref 38–126)
ALT: 16 U/L — ABNORMAL LOW (ref 17–63)
ANION GAP: 7 (ref 5–15)
AST: 18 U/L (ref 15–41)
Albumin: 3.9 g/dL (ref 3.5–5.0)
BILIRUBIN TOTAL: 0.9 mg/dL (ref 0.3–1.2)
BUN: 17 mg/dL (ref 6–20)
CALCIUM: 8.5 mg/dL — AB (ref 8.9–10.3)
CO2: 29 mmol/L (ref 22–32)
Chloride: 102 mmol/L (ref 101–111)
Creatinine, Ser: 1.42 mg/dL — ABNORMAL HIGH (ref 0.61–1.24)
GFR, EST AFRICAN AMERICAN: 52 mL/min — AB (ref >60)
GFR, EST NON AFRICAN AMERICAN: 45 mL/min — AB (ref >60)
Glucose, Bld: 150 mg/dL — ABNORMAL HIGH (ref 65–99)
Potassium: 4 mmol/L (ref 3.5–5.1)
SODIUM: 138 mmol/L (ref 135–145)
TOTAL PROTEIN: 6 g/dL — AB (ref 6.5–8.1)

## 2016-02-04 LAB — TROPONIN I: TROPONIN I: 0.04 ng/mL — AB (ref <0.03)

## 2016-02-04 LAB — CBC WITH DIFFERENTIAL/PLATELET
BASOS ABS: 0 10*3/uL (ref 0.0–0.1)
BASOS ABS: 0 10*3/uL (ref 0.0–0.1)
BASOS PCT: 0 %
Basophils Relative: 1 %
EOS ABS: 0.1 10*3/uL (ref 0.0–0.7)
EOS ABS: 0.2 10*3/uL (ref 0.0–0.7)
EOS PCT: 2 %
EOS PCT: 3 %
HCT: 41.5 % (ref 39.0–52.0)
HCT: 41.9 % (ref 39.0–52.0)
HEMOGLOBIN: 14 g/dL (ref 13.0–17.0)
Hemoglobin: 13.6 g/dL (ref 13.0–17.0)
Lymphocytes Relative: 15 %
Lymphocytes Relative: 18 %
Lymphs Abs: 0.9 10*3/uL (ref 0.7–4.0)
Lymphs Abs: 1 10*3/uL (ref 0.7–4.0)
MCH: 31.8 pg (ref 26.0–34.0)
MCH: 30.9 pg (ref 26.0–34.0)
MCHC: 33.7 g/dL (ref 30.0–36.0)
MCHC: 32.5 g/dL (ref 30.0–36.0)
MCV: 94.3 fL (ref 78.0–100.0)
MCV: 95.2 fL (ref 78.0–100.0)
MONO ABS: 0.6 10*3/uL (ref 0.1–1.0)
Monocytes Absolute: 0.7 10*3/uL (ref 0.1–1.0)
Monocytes Relative: 12 %
Monocytes Relative: 11 %
NEUTROS PCT: 71 %
Neutro Abs: 4.2 10*3/uL (ref 1.7–7.7)
Neutro Abs: 3.5 10*3/uL (ref 1.7–7.7)
Neutrophils Relative %: 67 %
PLATELETS: 124 10*3/uL — AB (ref 150–400)
PLATELETS: 131 10*3/uL — AB (ref 150–400)
RBC: 4.4 MIL/uL (ref 4.22–5.81)
RBC: 4.4 MIL/uL (ref 4.22–5.81)
RDW: 14.2 % (ref 11.5–15.5)
RDW: 14 % (ref 11.5–15.5)
WBC: 5.9 10*3/uL (ref 4.0–10.5)
WBC: 5.2 10*3/uL (ref 4.0–10.5)

## 2016-02-04 LAB — T4, FREE: Free T4: 1.23 ng/dL — ABNORMAL HIGH (ref 0.61–1.12)

## 2016-02-04 LAB — BASIC METABOLIC PANEL
ANION GAP: 6 (ref 5–15)
BUN: 24 mg/dL — AB (ref 6–20)
CO2: 28 mmol/L (ref 22–32)
Calcium: 8.9 mg/dL (ref 8.9–10.3)
Chloride: 103 mmol/L (ref 101–111)
Creatinine, Ser: 1.43 mg/dL — ABNORMAL HIGH (ref 0.61–1.24)
GFR, EST AFRICAN AMERICAN: 51 mL/min — AB (ref >60)
GFR, EST NON AFRICAN AMERICAN: 44 mL/min — AB (ref >60)
Glucose, Bld: 158 mg/dL — ABNORMAL HIGH (ref 65–99)
POTASSIUM: 4.4 mmol/L (ref 3.5–5.1)
SODIUM: 137 mmol/L (ref 135–145)

## 2016-02-04 LAB — TSH
TSH: 1.43 u[IU]/mL (ref 0.350–4.500)
TSH: 1.789 u[IU]/mL (ref 0.350–4.500)

## 2016-02-04 LAB — MAGNESIUM: MAGNESIUM: 2.1 mg/dL (ref 1.7–2.4)

## 2016-02-04 LAB — PROTIME-INR
INR: 2.59
Prothrombin Time: 28.3 seconds — ABNORMAL HIGH (ref 11.4–15.2)

## 2016-02-04 LAB — BRAIN NATRIURETIC PEPTIDE: B Natriuretic Peptide: 390.2 pg/mL — ABNORMAL HIGH (ref 0.0–100.0)

## 2016-02-04 LAB — PHOSPHORUS: PHOSPHORUS: 2.5 mg/dL (ref 2.5–4.6)

## 2016-02-04 LAB — DIGOXIN LEVEL: DIGOXIN LVL: 0.3 ng/mL — AB (ref 0.8–2.0)

## 2016-02-04 MED ORDER — RAMIPRIL 2.5 MG PO CAPS
2.5000 mg | ORAL_CAPSULE | Freq: Every day | ORAL | Status: DC
  Administered 2016-02-04: 2.5 mg via ORAL
  Filled 2016-02-04 (×2): qty 1

## 2016-02-04 MED ORDER — ACETAMINOPHEN 325 MG PO TABS: 650.0000 mg | ORAL_TABLET | Freq: Three times a day (TID) | ORAL | Status: DC | PRN

## 2016-02-04 MED ORDER — SODIUM CHLORIDE 0.9 % IV BOLUS (SEPSIS)
250.0000 mL | Freq: Once | INTRAVENOUS | Status: AC
  Administered 2016-02-04: 250 mL via INTRAVENOUS

## 2016-02-04 MED ORDER — POTASSIUM CHLORIDE CRYS ER 20 MEQ PO TBCR: 20.0000 meq | EXTENDED_RELEASE_TABLET | Freq: Every day | ORAL | Status: DC | PRN

## 2016-02-04 MED ORDER — TORSEMIDE 20 MG PO TABS: 20.0000 mg | ORAL_TABLET | Freq: Every day | ORAL | Status: DC | PRN

## 2016-02-04 MED ORDER — WARFARIN - PHARMACIST DOSING INPATIENT: Freq: Every day | Status: DC

## 2016-02-04 MED ORDER — SODIUM CHLORIDE 0.9% FLUSH
3.0000 mL | Freq: Two times a day (BID) | INTRAVENOUS | Status: DC
  Administered 2016-02-04 – 2016-02-05 (×2): 3 mL via INTRAVENOUS

## 2016-02-04 MED ORDER — METOPROLOL TARTRATE 5 MG/5ML IV SOLN
2.5000 mg | Freq: Once | INTRAVENOUS | Status: AC
  Administered 2016-02-04: 2.5 mg via INTRAVENOUS
  Filled 2016-02-04: qty 5

## 2016-02-04 MED ORDER — SPIRONOLACTONE 25 MG PO TABS
12.5000 mg | ORAL_TABLET | Freq: Every day | ORAL | Status: DC
  Administered 2016-02-05: 12.5 mg via ORAL
  Filled 2016-02-04: qty 1

## 2016-02-04 MED ORDER — SIMVASTATIN 10 MG PO TABS
10.0000 mg | ORAL_TABLET | Freq: Every day | ORAL | Status: DC
  Administered 2016-02-04: 10 mg via ORAL
  Filled 2016-02-04: qty 1

## 2016-02-04 MED ORDER — WARFARIN SODIUM 7.5 MG PO TABS
7.5000 mg | ORAL_TABLET | ORAL | Status: AC
  Administered 2016-02-04: 7.5 mg via ORAL
  Filled 2016-02-04: qty 1

## 2016-02-04 MED ORDER — AMIODARONE HCL 200 MG PO TABS
100.0000 mg | ORAL_TABLET | Freq: Every day | ORAL | Status: DC
  Administered 2016-02-04: 100 mg via ORAL
  Filled 2016-02-04: qty 1

## 2016-02-04 MED ORDER — DIGOXIN 125 MCG PO TABS
125.0000 ug | ORAL_TABLET | ORAL | Status: DC
  Administered 2016-02-05: 125 ug via ORAL
  Filled 2016-02-04: qty 1

## 2016-02-04 MED ORDER — SODIUM CHLORIDE 0.9 % IV BOLUS (SEPSIS)
500.0000 mL | Freq: Once | INTRAVENOUS | Status: AC
  Administered 2016-02-04: 500 mL via INTRAVENOUS

## 2016-02-04 MED ORDER — LEVOTHYROXINE SODIUM 25 MCG PO TABS
125.0000 ug | ORAL_TABLET | Freq: Every day | ORAL | Status: DC
  Administered 2016-02-05: 125 ug via ORAL
  Filled 2016-02-04: qty 1

## 2016-02-04 MED ORDER — CARVEDILOL 3.125 MG PO TABS
3.1250 mg | ORAL_TABLET | Freq: Two times a day (BID) | ORAL | Status: DC
  Administered 2016-02-04 – 2016-02-05 (×2): 3.125 mg via ORAL
  Filled 2016-02-04 (×2): qty 1

## 2016-02-04 NOTE — H&P (Signed)
Admit date: 02/04/2016 Referring Physician: Dr. Pricilla Loveless Primary Cardiologist: Dr. Gloris Manchester Turner/Dr. Berton Mount Chief complaint/reason for admission:Tachycardia  HPI: This is an 82 y.o. male with a history of nonobstructive ASCAD, NICM by cath 2008 (EF 35-40% by echo 07/2014) with chronic CHF s/p Med[employer] CRT-D, LBBB, CRI (baseline 1.7-1.8), HTN, PAF, OSA and dyslipidemia who presented earlier today to [facility] due to increased heart rate.  He has been in his usual state of health with no problems and noted that his heart rate was elevated this am.  He denied any palpitations or dizziness.  His wife says that he is very strict about weighing himself and checking his BP and HR every am.  His weight has been very stable at 212 lbs and he denies any SOB, DOE, PND or orthopnea.  He has chronic LE edema which is stable.  This am when checking his BP the systolic was in the 90's and he noticed that his HR was 120's.  He sat down but the HR did not improve and he decided to go to the ER for evaluation.  He denies any chest pain or pressure and cannot tell his HR is fast except when taking his BP.  He is followed in [facility] for NYHA class II-III CHF.  He is also followed by Dr. Graciela Husbands and had last ICD check in June and had 6.3% afib/AT burden.    PMH:    Past Medical History:  Diagnosis Date  . Anemia, unspecified 04/07/2013  . Atrial fibrillation (HCC)    fibrillation/flutter  . BPH (benign prostatic hypertrophy)   . Chronic systolic CHF (congestive heart failure) (HCC)    Nonischemic DCM EF 10% s/p BiV AICD  . Coronary artery disease 2008   nonobstructive ASCAD  . Depression   . Hyperlipidemia   . Hypertension   . Hypothyroidism   . Left bundle branch block   . Nonischemic cardiomyopathy (HCC)    ef 10 %  . OSA (obstructive sleep apnea)    intolerant to CPAP  . Osteoarthritis   . Sleep apnea    stopped using CPAP  . Thrombocytopenia (HCC)     PSH:    Past Surgical  History:  Procedure Laterality Date  . CARDIAC DEFIBRILLATOR PLACEMENT     left chest  . CARDIOVERSION N/A 12/29/2012   Procedure: CARDIOVERSION;  Surgeon: Quintella Reichert, MD;  Location: [facility];  Service: Cardiovascular;  Laterality: N/A;  . CHOLECYSTECTOMY    . Colonscopy     reportedly negative per pt; around 2005.    Marland Kitchen TRANSURETHRAL RESECTION OF PROSTATE     for BPH    ALLERGIES:   Review of patient's allergies indicates no known allergies.  Prior to Admit Meds:   Prescriptions Prior to Admission  Medication Sig Dispense Refill Last Dose  . acetaminophen (TYLENOL) 650 MG CR tablet Take 1 tablet (650 mg total) by mouth every 8 (eight) hours as needed for pain. 90 tablet 3 week ago  . amiodarone (PACERONE) 200 MG tablet Take 0.5 tablets (100 mg total) by mouth daily. (Patient taking differently: Take 100 mg by mouth at bedtime. ) 45 tablet 1 02/03/2016 at Unknown time  . carvedilol (COREG) 3.125 MG tablet Take 1 tablet (3.125 mg total) by mouth 2 (two) times daily. 180 tablet 1 02/04/2016 at 730  . DIGOX 125 MCG tablet Take 1 tablet by mouth  every other day 45 tablet 0 02/03/2016 at Unknown time  . levothyroxine (SYNTHROID,  LEVOTHROID) 125 MCG tablet Take 1 tablet (125 mcg total) by mouth daily before breakfast. 90 tablet 1 02/04/2016 at Unknown time  . potassium chloride SA (K-DUR,KLOR-CON) 20 MEQ tablet Take 20 mEq by mouth daily as needed (with each torsemide dose).    3 months ago  . ramipril (ALTACE) 2.5 MG capsule Take 1 capsule by mouth  daily (Patient taking differently: Take 1 capsule by mouth  daily at bedtime) 90 capsule 0 02/03/2016 at Unknown time  . simvastatin (ZOCOR) 10 MG tablet Take 1 tablet (10 mg total) by mouth every evening. (Patient taking differently: Take 10 mg by mouth at bedtime. ) 90 tablet 2 02/03/2016 at Unknown time  . spironolactone (ALDACTONE) 25 MG tablet Take 0.5 tablets (12.5 mg total) by mouth daily. NEED FOLLOW UP APPOINTMENT FOR MORE REFILLS 45 tablet 0  02/04/2016 at Unknown time  . torsemide (DEMADEX) 20 MG tablet Take 20 mg by mouth daily as needed (weight of 215 lbs or more).    3 months ago  . warfarin (COUMADIN) 5 MG tablet Take 1 tablet by mouth once daily at 6PM (Patient taking differently: take 1 1/2 tablet by mouth on Saturday at bedtime and 1 tablet all other days of the week at bedtime) 90 tablet 0 02/03/2016 at Unknown time  . gabapentin (NEURONTIN) 100 MG capsule Take 1 capsule (100 mg total) by mouth at bedtime as needed (neuropathy). (Patient not taking: Reported on 02/04/2016) 90 capsule 0 Not Taking at Unknown time   Family HX:    Family History  Problem Relation Age of Onset  . Heart failure Mother   . Stroke Mother   . Heart disease Father   . Cancer Maternal Uncle     Unknown subtype  . Heart attack Neg Hx    Social HX:    Social History   Social History  . Marital status: Married    Spouse name: N/A  . Number of children: 4  . Years of education: N/A   Occupational History  . retired     retired Optometrist; now in [employer], political party  .  Retired   Social History Main Topics  . Smoking status: Never Smoker  . Smokeless tobacco: Never Used  . Alcohol use No  . Drug use: No  . Sexual activity: No   Other Topics Concern  . Not on file   Social History Narrative  . No narrative on file     ROS:  All 11 ROS were addressed and are negative except what is stated in the HPI  PHYSICAL EXAM Vitals:   02/04/16 1715 02/04/16 1812  BP: (!) 124/108 (!) 133/95  Pulse: (!) 126 (!) 127  Resp: 16 18  Temp:  97.7 F (36.5 C)   General: Well developed, well nourished, in no acute distress Head: Eyes PERRLA, No xanthomas.   Normal cephalic and atramatic  Lungs:   Clear bilaterally to auscultation and percussion. Heart:   HRRR S1 S2 Pulses are 2+ & equal.            No carotid bruit. No JVD.  No abdominal bruits. No femoral bruits. Abdomen: Bowel sounds are positive, abdomen soft and non-tender without  masses Msk:  Back normal, normal gait. Normal strength and tone for age. Extremities:   No clubbing, cyanosis.  Trace edema.  DP +1 Neuro: Alert and oriented X 3. Psych:  Good affect, responds appropriately   Labs:   Lab Results  Component Value Date  WBC 5.9 02/04/2016   HGB 14.0 02/04/2016   HCT 41.5 02/04/2016   MCV 94.3 02/04/2016   PLT 124 (L) 02/04/2016    Recent Labs Lab 02/04/16 1200  NA 137  K 4.4  CL 103  CO2 28  BUN 24*  CREATININE 1.43*  CALCIUM 8.9  GLUCOSE 158*   Lab Results  Component Value Date   TROPONINI 0.04 (HH) 02/04/2016   No results found for: PTT Lab Results  Component Value Date   INR 2.59 02/04/2016   INR 3.1 01/31/2016   INR 2.3 12/28/2015     Lab Results  Component Value Date   CHOL 123 (L) 11/18/2015   CHOL 115 (L) 04/20/2015   CHOL 134 07/06/2014   Lab Results  Component Value Date   HDL 43 11/18/2015   HDL 34 (L) 04/20/2015   HDL 44 07/06/2014   Lab Results  Component Value Date   LDLCALC 71 11/18/2015   LDLCALC 67 04/20/2015   LDLCALC 75 07/06/2014   Lab Results  Component Value Date   TRIG 44 11/18/2015   TRIG 69 04/20/2015   TRIG 76 07/06/2014   Lab Results  Component Value Date   CHOLHDL 2.9 11/18/2015   CHOLHDL 3.4 04/20/2015   No results found for: LDLDIRECT    Radiology:  Dg Chest Port 1 View  Result Date: 02/04/2016 CLINICAL DATA:  Patient with low blood pressure and tachycardia. EXAM: PORTABLE CHEST 1 VIEW COMPARISON:  Chest CT 07/18/2015; chest radiograph 03/30/2015 FINDINGS: Multi lead AICD device overlies the left hemi thorax, leads stable in position. Stable cardiomegaly. No consolidative pulmonary opacities. Pulmonary vascular redistribution. No pleural effusion or pneumothorax. Apical pleural parenchymal thickening. Right shoulder joint degenerative changes. IMPRESSION: Cardiomegaly and pulmonary vascular redistribution. Electronically Signed   By: Annia Belt M.D.   On: 02/04/2016 12:23     EKG:  Undetermined atrial rhythm with ventricular tracking at 120bpm  ASSESSMENT/PLAN:  1.  Tachycardia - it appears that he has an atrial tachycardia of some type with tracking of his pacer at the upper rate.  His last interrogation in June showed 6.3% AT/afib burden.  I will have the pacer rep come interrogate his device.  He is completely asymptomatic otherwise.  Admit to tele bed for monitoring. 2.  Paroxysmal atrial fibrillation ? May be in afib or aflutter at this time.  Will continue current dose of Amio at this time until more info obtained from pacer interrogation.   Continue warfarin per pharmacy.  His INR has been therapeutic for months. Continue digoxin and check dig level.  Check TSH.  Continue coreg as HR tolerates.  3.  Nonischemic DCM with last EF assessment 35-40% by echo 07/2014 s/p CRT-D with underlying LBBB.  Continue BB and ACE I as BP tolerates.   4.  Chronic systolic CHF NYHA class III.  He appears euvolemic on exam today and weight is very stable at his dry weight of 212lbs,  Continue demadex PRN sliding scale and aldactone.  Continue BB and ACE I as BP tolerates.   5.  Nonobstructive ASCAD with no angina.  No ASA due to warfarin.  Continue statin.  6.  HTN - BP on the soft side most likely secondary to tachycardia.  7.  OSA on CPAP therapy and compliant.   8.  CKD stage III - his creatinine is at baseline.    Armanda Magic, MD  02/04/2016  7:54 PM

## 2016-02-04 NOTE — Progress Notes (Signed)
ANTICOAGULATION CONSULT NOTE - Initial Consult  Pharmacy Consult for warfarin Indication: atrial fibrillation  No Known Allergies  Patient Measurements:    Vital Signs: Temp: 97.8 F (36.6 C) (08/12 2006) Temp Source: Oral (08/12 2006) BP: 119/74 (08/12 2006) Pulse Rate: 129 (08/12 2006)  Labs:  Recent Labs  02/04/16 1200  HGB 14.0  HCT 41.5  PLT 124*  LABPROT 28.3*  INR 2.59  CREATININE 1.43*  TROPONINI 0.04*    Estimated Creatinine Clearance: 48.4 mL/min (by C-G formula based on SCr of 1.43 mg/dL).   Medical History: Past Medical History:  Diagnosis Date  . Anemia, unspecified 04/07/2013  . Atrial fibrillation (HCC)    fibrillation/flutter  . BPH (benign prostatic hypertrophy)   . Chronic systolic CHF (congestive heart failure) (HCC)    Nonischemic DCM EF 10% s/p BiV AICD  . Coronary artery disease 2008   nonobstructive ASCAD  . Depression   . Hyperlipidemia   . Hypertension   . Hypothyroidism   . Left bundle branch block   . Nonischemic cardiomyopathy (HCC)    ef 10 %  . OSA (obstructive sleep apnea)    intolerant to CPAP  . Osteoarthritis   . Sleep apnea    stopped using CPAP  . Thrombocytopenia (HCC)     Assessment: 83 yom on warfarin pta for PAF. Pharmacy consulted to dose inpatient. INR 2.59 on admit, CBC wnl, no bleed documented. Per Cards note, INR has been therapeutic for months.  PTA dose: 5mg  daily except 7.5mg  on Sat (last dose pta 8/11)  Goal of Therapy:  INR 2-3 Monitor platelets by anticoagulation protocol: Yes   Plan:  Warfarin 7.5mg  x 1 dose tonight - can likely enter home dose soon if INR stable Daily INR Monitor s/sx bleeding  Babs Bertin, PharmD, BCPS Clinical Pharmacist Pager [phone] 02/04/2016 8:31 PM

## 2016-02-04 NOTE — ED Triage Notes (Signed)
Patient states he was concerned because his home BP machine indicated his BP was low and his heart rate was elevated this morning. He has a Technical brewer which is set to fire at 72. No SOB, No CP

## 2016-02-04 NOTE — ED Provider Notes (Signed)
[facility] Provider Note   CSN: [id number] Arrival date & time: 02/04/16  1118  First Provider Contact:  First MD Initiated Contact with Patient 02/04/16 1147        History   Chief Complaint Chief Complaint  Patient presents with  . Tachycardia    HPI [name] [name] is a 82 y.o. male presenting with asymptomatic tachycardia. He checked his blood pressure this morning like he does every morning and his blood pressure was in the 90s and his heart rate was in the 120s. This was consistent over multiple rechecks to the morning. He did all his medicines including his Coreg like normal. He has not missed any medicines recently. He has not felt ill recently. No recent vomiting, diarrhea, chest pain, shortness of breath, headaches, cough, fever, or urine symptoms. Given his heart rate did not go down he came in to be evaluated. He has a pacemaker/ICD. Has a history of A. fib and is on warfarin.  HPI  Past Medical History:  Diagnosis Date  . Anemia, unspecified 04/07/2013  . Atrial fibrillation (HCC)    fibrillation/flutter  . BPH (benign prostatic hypertrophy)   . Chronic systolic CHF (congestive heart failure) (HCC)    Nonischemic DCM EF 10% s/p BiV AICD  . Coronary artery disease 2008   nonobstructive ASCAD  . Depression   . Hyperlipidemia   . Hypertension   . Hypothyroidism   . Left bundle branch block   . Nonischemic cardiomyopathy (HCC)    ef 10 %  . OSA (obstructive sleep apnea)    intolerant to CPAP  . Osteoarthritis   . Sleep apnea    stopped using CPAP  . Thrombocytopenia [facility])     Patient Active Problem List   Diagnosis Date Noted  . Cholecystitis 02/04/2016  . Tachycardia 02/04/2016  . Primary osteoarthritis of left hip 11/25/2015  . Lung crackles 11/17/2014  . Lung nodule 11/17/2014  . Primary osteoarthritis of both knees 11/03/2014  . Abnormal PFTs (pulmonary function tests) 04/07/2014  . BPH (benign prostatic hyperplasia) 01/13/2014  .  Chronic renal insufficiency, stage III (moderate) 01/13/2014  . Chronic anticoagulation 04/09/2013  . HTN (hypertension) 04/09/2013  . CAD (coronary artery disease) 04/09/2013  . Anemia, unspecified 04/07/2013  . Chronic systolic heart failure (HCC) 02/17/2013  . Atrial tachycardia (HCC) Jun 11, 202014  . Nonischemic cardiomyopathy (HCC)   . Left bundle branch block   . Thrombocytopenia (HCC)   . Biventricular implantable cardioverter-defibrillator-Medtronic 01/10/2011  . Dyspnea on exertion 08/09/2010  . VENTRICULAR TACHYCARDIA 06/23/2009  . Hypothyroidism 06/22/2009  . Hyperlipidemia 06/22/2009  . DEPRESSION 06/22/2009  . FIBRILLATION, ATRIAL 06/22/2009  . OSA (obstructive sleep apnea) 06/22/2009    Past Surgical History:  Procedure Laterality Date  . CARDIAC DEFIBRILLATOR PLACEMENT     left chest  . CARDIOVERSION N/A 12/29/2012   Procedure: CARDIOVERSION;  Surgeon: Quintella Reichert, MD;  Location: [facility];  Service: Cardiovascular;  Laterality: N/A;  . CHOLECYSTECTOMY    . Colonscopy     reportedly negative per pt; around 2005.    Marland Kitchen TRANSURETHRAL RESECTION OF PROSTATE     for BPH       Home Medications    Prior to Admission medications   Medication Sig Start Date End Date Taking? Authorizing Provider  acetaminophen (TYLENOL) 650 MG CR tablet Take 1 tablet (650 mg total) by mouth every 8 (eight) hours as needed for pain. 11/03/14   Monica Becton, MD  amiodarone (PACERONE) 200 MG tablet Take  0.5 tablets (100 mg total) by mouth daily. 11/11/15   Quintella Reichert, MD  carvedilol (COREG) 3.125 MG tablet Take 1 tablet (3.125 mg total) by mouth 2 (two) times daily. 11/11/15   Quintella Reichert, MD  DIGOX 125 MCG tablet Take 1 tablet by mouth  every other day 11/07/15   Quintella Reichert, MD  gabapentin (NEURONTIN) 100 MG capsule Take 1 capsule (100 mg total) by mouth at bedtime as needed (neuropathy). 11/29/15   Laren Boom, DO  levothyroxine (SYNTHROID, LEVOTHROID) 125 MCG tablet  Take 1 tablet (125 mcg total) by mouth daily before breakfast. 09/09/15   Laren Boom, DO  potassium chloride SA (K-DUR,KLOR-CON) 20 MEQ tablet Take 20 mEq by mouth as needed. When you take Torsemide    Historical Provider, MD  ramipril (ALTACE) 2.5 MG capsule Take 1 capsule by mouth  daily 11/07/15   Quintella Reichert, MD  simvastatin (ZOCOR) 10 MG tablet Take 1 tablet (10 mg total) by mouth every evening. 08/18/15   Dolores Patty, MD  spironolactone (ALDACTONE) 25 MG tablet Take 0.5 tablets (12.5 mg total) by mouth daily. NEED FOLLOW UP APPOINTMENT FOR MORE REFILLS 08/15/15   Laren Boom, DO  torsemide (DEMADEX) 20 MG tablet Take 20 mg by mouth as needed. As needed for weight 215 or greater. Take 20 mg    Historical Provider, MD  warfarin (COUMADIN) 5 MG tablet Take 1 tablet by mouth once daily at [facility] 11/07/15   Laren Boom, DO    Family History Family History  Problem Relation Age of Onset  . Heart failure Mother   . Stroke Mother   . Heart disease Father   . Cancer Maternal Uncle     Unknown subtype  . Heart attack Neg Hx     Social History Social History  Substance Use Topics  . Smoking status: Never Smoker  . Smokeless tobacco: Never Used  . Alcohol use No     Allergies   Review of patient's allergies indicates no known allergies.   Review of Systems Review of Systems  Constitutional: Negative for fever.  Respiratory: Negative for shortness of breath.   Cardiovascular: Negative for chest pain, palpitations and leg swelling.  Gastrointestinal: Negative for abdominal pain, diarrhea and vomiting.  Genitourinary: Negative for dysuria.  Neurological: Negative for dizziness and light-headedness.  All other systems reviewed and are negative.    Physical Exam Updated Vital Signs BP 99/71   Pulse (!) 123   Temp 97.7 F (36.5 C) (Oral)   Resp 19   SpO2 98%   Physical Exam  Constitutional: He is oriented to person, place, and time. He appears well-developed and  well-nourished. No distress.  HENT:  Head: Normocephalic and atraumatic.  Right Ear: External ear normal.  Left Ear: External ear normal.  Nose: Nose normal.  Eyes: Right eye exhibits no discharge. Left eye exhibits no discharge.  Neck: Neck supple.  Cardiovascular: Regular rhythm, normal heart sounds and intact distal pulses.  Tachycardia present.   Pulmonary/Chest: Effort normal and breath sounds normal.  Abdominal: Soft. He exhibits no distension. There is no tenderness.  Musculoskeletal: He exhibits no edema.  Neurological: He is alert and oriented to person, place, and time.  Skin: Skin is warm and dry. He is not diaphoretic.  Nursing note and vitals reviewed.    ED Treatments / Results  Labs (all labs ordered are listed, but only abnormal results are displayed) Labs Reviewed  CBC WITH DIFFERENTIAL/PLATELET - Abnormal; Notable for the  following:       Result Value   Platelets 124 (*)    All other components within normal limits  BASIC METABOLIC PANEL - Abnormal; Notable for the following:    Glucose, Bld 158 (*)    BUN 24 (*)    Creatinine, Ser 1.43 (*)    GFR calc non Af Amer 44 (*)    GFR calc Af Amer 51 (*)    All other components within normal limits  TROPONIN I - Abnormal; Notable for the following:    Troponin I 0.04 (*)    All other components within normal limits  PROTIME-INR - Abnormal; Notable for the following:    Prothrombin Time 28.3 (*)    All other components within normal limits  TSH  T4, FREE    EKG  EKG Interpretation  Date/Time:  Saturday February 04 2016 11:32:13 EDT Ventricular Rate:  122 PR Interval:    QRS Duration: 169 QT Interval:  398 QTC Calculation: 568 R Axis:   -91 Text Interpretation:  Junctional tachycardia/paced rhythm Right bundle branch block rate is faster compared to 2014 Confirmed by Idali Lafever MD, Amaani Guilbault [phone]) on 02/04/2016 11:39:54 AM       Radiology Dg Chest Port 1 View  Result Date: 02/04/2016 CLINICAL DATA:   Patient with low blood pressure and tachycardia. EXAM: PORTABLE CHEST 1 VIEW COMPARISON:  Chest CT 07/18/2015; chest radiograph 03/30/2015 FINDINGS: Multi lead AICD device overlies the left hemi thorax, leads stable in position. Stable cardiomegaly. No consolidative pulmonary opacities. Pulmonary vascular redistribution. No pleural effusion or pneumothorax. Apical pleural parenchymal thickening. Right shoulder joint degenerative changes. IMPRESSION: Cardiomegaly and pulmonary vascular redistribution. Electronically Signed   By: Annia Belt M.D.   On: 02/04/2016 12:23    Procedures Procedures (including critical care time)  Medications Ordered in ED Medications  sodium chloride 0.9 % bolus 250 mL (0 mLs Intravenous Stopped 02/04/16 1338)  sodium chloride 0.9 % bolus 500 mL (0 mLs Intravenous Stopped 02/04/16 1429)  metoprolol (LOPRESSOR) injection 2.5 mg (2.5 mg Intravenous Given 02/04/16 1432)  sodium chloride 0.9 % bolus 250 mL (250 mLs Intravenous New Bag/Given 02/04/16 1540)     Initial Impression / Assessment and Plan / ED Course  I have reviewed the triage vital signs and the nursing notes.  Pertinent labs & imaging results that were available during my care of the patient were reviewed by me and considered in my medical decision making (see chart for details).  Clinical Course  Comment By Time  Patient is well appearing and asymptomatic. Will interrogate pacemaker, check labs, small fluid bolus. Pricilla Loveless, MD 08/12 [phone]  Pacemaker interrogation shows that he is having atrial tachycardia that is causing his ventricles to keep pacing around 125/m. He is still asymptomatic. Lab work is unremarkable besides very mildly elevated troponin at 0.04. His battery is also low. Will consult cardiology. Pricilla Loveless, MD 08/12 [phone]  Cardiology (Dr. Wyline Mood) recommends 500 cc bolus, then 2.5-5 mg metoprolol if still tachy and repeat ECG when slower.  Pricilla Loveless, MD 08/12 1323  Patient remains a  symptomatically but his heart rate has not budged with metoprolol. Blood pressure trended down to 99/71. Discussed with Dr. branch again, plan to admit to [facility] for overnight observation on telemetry. Asks 4250 mL IV bolus again. Will send interrogation report. Patient is willing to go over although he still feels fine. Pricilla Loveless, MD 08/12 1538     Final Clinical Impressions(s) / ED Diagnoses   Final  diagnoses:  Tachycardia    New Prescriptions New Prescriptions   No medications on file     Pricilla Loveless, MD 02/04/16 1541

## 2016-02-05 DIAGNOSIS — R000 Tachycardia, unspecified: Secondary | ICD-10-CM

## 2016-02-05 DIAGNOSIS — I471 Supraventricular tachycardia: Secondary | ICD-10-CM | POA: Diagnosis not present

## 2016-02-05 LAB — BASIC METABOLIC PANEL
ANION GAP: 5 (ref 5–15)
BUN: 18 mg/dL (ref 6–20)
CALCIUM: 8.5 mg/dL — AB (ref 8.9–10.3)
CO2: 26 mmol/L (ref 22–32)
Chloride: 104 mmol/L (ref 101–111)
Creatinine, Ser: 1.35 mg/dL — ABNORMAL HIGH (ref 0.61–1.24)
GFR, EST AFRICAN AMERICAN: 55 mL/min — AB (ref >60)
GFR, EST NON AFRICAN AMERICAN: 48 mL/min — AB (ref >60)
Glucose, Bld: 128 mg/dL — ABNORMAL HIGH (ref 65–99)
Potassium: 3.8 mmol/L (ref 3.5–5.1)
Sodium: 135 mmol/L (ref 135–145)

## 2016-02-05 LAB — CBC
HCT: 39.1 % (ref 39.0–52.0)
HEMOGLOBIN: 12.6 g/dL — AB (ref 13.0–17.0)
MCH: 30.5 pg (ref 26.0–34.0)
MCHC: 32.2 g/dL (ref 30.0–36.0)
MCV: 94.7 fL (ref 78.0–100.0)
Platelets: 115 10*3/uL — ABNORMAL LOW (ref 150–400)
RBC: 4.13 MIL/uL — AB (ref 4.22–5.81)
RDW: 14.2 % (ref 11.5–15.5)
WBC: 5.6 10*3/uL (ref 4.0–10.5)

## 2016-02-05 LAB — PROTIME-INR
INR: 2.6
PROTHROMBIN TIME: 28.3 s — AB (ref 11.4–15.2)

## 2016-02-05 MED ORDER — WARFARIN SODIUM 5 MG PO TABS: 5.0000 mg | ORAL_TABLET | ORAL | Status: DC

## 2016-02-05 MED ORDER — WARFARIN SODIUM 7.5 MG PO TABS: 7.5000 mg | ORAL_TABLET | ORAL | Status: DC

## 2016-02-05 NOTE — Progress Notes (Signed)
     Subjective:   No complaints   Objective:   Temp:  [97.7 F (36.5 C)-98 F (36.7 C)] 98 F (36.7 C) (08/13 0503) Pulse Rate:  [73-129] 73 (08/13 0503) Resp:  [13-22] 18 (08/13 0503) BP: (99-133)/(67-108) 109/67 (08/13 0503) SpO2:  [95 %-100 %] 95 % (08/13 0503)    There were no vitals filed for this visit.  Intake/Output Summary (Last 24 hours) at 02/05/16 1243 Last data filed at 02/05/16 0700  Gross per 24 hour  Intake                0 ml  Output              650 ml  Net             -650 ml    Telemetry: AV pacing  Exam:  General: NAD  HEENT: sclera clear, throat clear  Resp: CTAB  Cardiac: RRR, no jvd  GI: abdomen soft, NT, ND  MSK: no LE edema  Neuro: no focal deficits  Psych: appropriate affect  Lab Results:  Basic Metabolic Panel:  Recent Labs Lab 02/04/16 1200 02/04/16 2101 02/05/16 0218  NA 137 138 135  K 4.4 4.0 3.8  CL 103 102 104  CO2 28 29 26   GLUCOSE 158* 150* 128*  BUN 24* 17 18  CREATININE 1.43* 1.42* 1.35*  CALCIUM 8.9 8.5* 8.5*  MG  --  2.1  --     Liver Function Tests:  Recent Labs Lab 02/04/16 2101  AST 18  ALT 16*  ALKPHOS 73  BILITOT 0.9  PROT 6.0*  ALBUMIN 3.9    CBC:  Recent Labs Lab 02/04/16 1200 02/04/16 2101 02/05/16 0218  WBC 5.9 5.2 5.6  HGB 14.0 13.6 12.6*  HCT 41.5 41.9 39.1  MCV 94.3 95.2 94.7  PLT 124* 131* 115*    Cardiac Enzymes:  Recent Labs Lab 02/04/16 1200  TROPONINI 0.04*    BNP: No results for input(s): PROBNP in the last 8760 hours.  Coagulation:  Recent Labs Lab 01/31/16 1042 02/04/16 1200 02/05/16 0218  INR 3.1 2.59 2.60    ECG:   Medications:   Scheduled Medications: . amiodarone  100 mg Oral QHS  . carvedilol  3.125 mg Oral BID  . digoxin  125 mcg Oral QODAY  . levothyroxine  125 mcg Oral QAC breakfast  . ramipril  2.5 mg Oral Daily  . simvastatin  10 mg Oral QHS  . sodium chloride flush  3 mL Intravenous Q12H  . spironolactone  12.5 mg Oral  Daily  . Warfarin - Pharmacist Dosing Inpatient   Does not apply q1800     Infusions:     PRN Medications:  acetaminophen, potassium chloride SA, torsemide     Assessment/Plan    1. Tachycardia - has medtronic CRT-D - noted elevated heart rates at home, no specific symptoms  - followed by Dr. Graciela Husbands and had last ICD check in June and had 6.3% afib/AT burden.   - device check this admission showed elevated atrial rates probable atach. His device was changed from DDDR to DDDIR. This morning tachcyardia resolved, he is AV paced at 70 - spoke with Dr Graciela Husbands, recommends continue current device setting. No change in medications, f/u with him as outpatient.   2. Chronic systolic HF - LVEF 35-40% by echo 07/2014 - appears euvolemic - continue current meds    Dina Rich, M.D.

## 2016-02-05 NOTE — Discharge Summary (Signed)
Discharge Summary    Patient ID: [name] [name],  MRN: [id number], DOB/AGE: [name] BENNETT82 y.o.  Admit date: 02/04/2016 Discharge date: 02/05/2016  Primary Care Provider: Sunnie Nielsen Primary Cardiologist: T. Mayford Knife, MD / S. Graciela Husbands, MD   Discharge Diagnoses    Principal Problem:   Atrial tachycardia [facility]) Active Problems:   Nonischemic cardiomyopathy (HCC)   Chronic systolic heart failure (HCC)   CAD (coronary artery disease)   Tachycardia   Hyperlipidemia   FIBRILLATION, ATRIAL   Biventricular implantable cardioverter-defibrillator-Medtronic   Chronic anticoagulation   HTN (hypertension)   OSA (obstructive sleep apnea)   Chronic renal insufficiency, stage III (moderate)  Allergies No Known Allergies  Diagnostic Studies/Procedures    None _____________   History of Present Illness     82 year old male with a history of nonobstructive CAD, nonischemic cardiomyopathy, chronic systolic CHF status post Medtronic biventricular AICD, stage III chronic kidney disease, hypertension, paroxysmal atrial fibrillation on chronic Coumadin anticoagulation, sleep apnea, and hyperlipidemia. He was in his usual state of health until the morning of August 12, when he began to experience elevated heart rates, noted on his blood pressure machine. He was asymptomatic. He presented to the emergency department where ECG showed an undetermined atrial rhythm with ventricular tracking at 120 bpm. He was admitted for further evaluation.  Hospital Course     Consultants: None   Following admission, his device was interrogated by the Medtronic representative. This revealed elevated atrial rates probably associated with atrial tachycardia. After discussion with Dr. Graciela Husbands, his device settings were changed from DDDR to DDD IR. He has had no further tachycardia and has been AV paced at 70 bpm this morning. He remains on amiodarone therapy and is also anticoagulated with Coumadin. He will be  discharged home today in good condition with a plan to follow up with electrophysiology clinic within the next 1-2 weeks. _____________  Discharge Vitals Blood pressure 109/67, pulse 73, temperature 98 F (36.7 C), temperature source Oral, resp. rate 18, SpO2 95 %.  There were no vitals filed for this visit.  Labs & Radiologic Studies    CBC  Recent Labs  02/04/16 1200 02/04/16 2101 02/05/16 0218  WBC 5.9 5.2 5.6  NEUTROABS 4.2 3.5  --   HGB 14.0 13.6 12.6*  HCT 41.5 41.9 39.1  MCV 94.3 95.2 94.7  PLT 124* 131* 115*   Basic Metabolic Panel  Recent Labs  02/04/16 2101 02/05/16 0218  NA 138 135  K 4.0 3.8  CL 102 104  CO2 29 26  GLUCOSE 150* 128*  BUN 17 18  CREATININE 1.42* 1.35*  CALCIUM 8.5* 8.5*  MG 2.1  --   PHOS 2.5  --    Liver Function Tests  Recent Labs  02/04/16 2101  AST 18  ALT 16*  ALKPHOS 73  BILITOT 0.9  PROT 6.0*  ALBUMIN 3.9   Cardiac Enzymes  Recent Labs  02/04/16 1200  TROPONINI 0.04*   Thyroid Function Tests  Recent Labs  02/04/16 2101  TSH 1.789   _____________  Dg Chest Port 1 View  Result Date: 02/04/2016 CLINICAL DATA:  Patient with low blood pressure and tachycardia. EXAM: PORTABLE CHEST 1 VIEW COMPARISON:  Chest CT 07/18/2015; chest radiograph 03/30/2015 FINDINGS: Multi lead AICD device overlies the left hemi thorax, leads stable in position. Stable cardiomegaly. No consolidative pulmonary opacities. Pulmonary vascular redistribution. No pleural effusion or pneumothorax. Apical pleural parenchymal thickening. Right shoulder joint degenerative changes. IMPRESSION: Cardiomegaly and pulmonary vascular redistribution. Electronically  Signed   By: Annia Belt M.D.   On: 02/04/2016 12:23   Disposition   Pt is being discharged home today in good condition.  Follow-up Plans & Appointments    Follow-up Information    Sherryl Manges, MD .   Specialty:  Cardiology Why:  we will arrange for follow-up and contact you. Contact  information: [address] [place] [address] [phone]            Discharge Medications   Current Discharge Medication List    CONTINUE these medications which have NOT CHANGED   Details  acetaminophen (TYLENOL) 650 MG CR tablet Take 1 tablet (650 mg total) by mouth every 8 (eight) hours as needed for pain. Qty: 90 tablet, Refills: 3   Associated Diagnoses: Primary osteoarthritis of both knees    amiodarone (PACERONE) 200 MG tablet Take 0.5 tablets (100 mg total) by mouth daily. Qty: 45 tablet, Refills: 1    carvedilol (COREG) 3.125 MG tablet Take 1 tablet (3.125 mg total) by mouth 2 (two) times daily. Qty: 180 tablet, Refills: 1    DIGOX 125 MCG tablet Take 1 tablet by mouth  every other day Qty: 45 tablet, Refills: 0    levothyroxine (SYNTHROID, LEVOTHROID) 125 MCG tablet Take 1 tablet (125 mcg total) by mouth daily before breakfast. Qty: 90 tablet, Refills: 1    potassium chloride SA (K-DUR,KLOR-CON) 20 MEQ tablet Take 20 mEq by mouth daily as needed (with each torsemide dose).     ramipril (ALTACE) 2.5 MG capsule Take 1 capsule by mouth  daily Qty: 90 capsule, Refills: 0    simvastatin (ZOCOR) 10 MG tablet Take 1 tablet (10 mg total) by mouth every evening. Qty: 90 tablet, Refills: 2    spironolactone (ALDACTONE) 25 MG tablet Take 0.5 tablets (12.5 mg total) by mouth daily. NEED FOLLOW UP APPOINTMENT FOR MORE REFILLS Qty: 45 tablet, Refills: 0    torsemide (DEMADEX) 20 MG tablet Take 20 mg by mouth daily as needed (weight of 215 lbs or more).     warfarin (COUMADIN) 5 MG tablet Take 1 tablet by mouth once daily at [facility]: 90 tablet, Refills: 0      STOP taking these medications     gabapentin (NEURONTIN) 100 MG capsule          Outstanding Labs/Studies   None  Duration of Discharge Encounter   Greater than 30 minutes including physician time.  Signed, Nicolasa Ducking NP 02/05/2016, 2:35 PM

## 2016-02-05 NOTE — Progress Notes (Signed)
Pt ambulated with front wheel [place] and RN standby 290ft.

## 2016-02-07 ENCOUNTER — Ambulatory Visit (INDEPENDENT_AMBULATORY_CARE_PROVIDER_SITE_OTHER): Payer: Medicare Other | Admitting: Osteopathic Medicine

## 2016-02-07 ENCOUNTER — Encounter: Payer: Self-pay | Admitting: Internal Medicine

## 2016-02-07 DIAGNOSIS — I480 Paroxysmal atrial fibrillation: Secondary | ICD-10-CM | POA: Diagnosis not present

## 2016-02-07 LAB — POCT INR: INR: 3

## 2016-02-08 NOTE — Progress Notes (Signed)
Pt advised of Rx dosage change, will contact clinic to schedule.

## 2016-02-08 NOTE — Progress Notes (Signed)
INR POC results and nurse notes reviewed, INR on blood draw in hospital 2 days prior was normal at 2.6. Is 3.0 today on POC. He can continue Coumadin at 1 tablet daily, no more 1 1/2 tablets, and recheck INR in 2 weeks.

## 2016-02-09 ENCOUNTER — Ambulatory Visit (INDEPENDENT_AMBULATORY_CARE_PROVIDER_SITE_OTHER): Payer: Medicare Other | Admitting: Internal Medicine

## 2016-02-09 ENCOUNTER — Encounter: Payer: Self-pay | Admitting: Internal Medicine

## 2016-02-09 VITALS — BP 132/78 | HR 72 | Ht 75.0 in | Wt 221.0 lb

## 2016-02-09 DIAGNOSIS — I480 Paroxysmal atrial fibrillation: Secondary | ICD-10-CM

## 2016-02-09 DIAGNOSIS — Z95810 Presence of automatic (implantable) cardiac defibrillator: Secondary | ICD-10-CM

## 2016-02-09 DIAGNOSIS — I429 Cardiomyopathy, unspecified: Secondary | ICD-10-CM

## 2016-02-09 DIAGNOSIS — I428 Other cardiomyopathies: Secondary | ICD-10-CM

## 2016-02-09 DIAGNOSIS — I5022 Chronic systolic (congestive) heart failure: Secondary | ICD-10-CM

## 2016-02-09 MED ORDER — AMIODARONE HCL 200 MG PO TABS: ORAL_TABLET | ORAL | 0 refills | Status: DC

## 2016-02-09 NOTE — Progress Notes (Signed)
Patient Care Team: Sunnie NielsenNatalie Alexander, DO as PCP - General (Osteopathic Medicine) Quintella Reichertraci R Turner, MD as Consulting Physician (Cardiology)   HPI  [name] [name] is a 82 y.o. male Seen in followup for an ICD implanted for primary prevention in the setting of nonischemic cardiomyopathy identified and confirmed by catheterization in 2008. Because of symptoms of heart failure and left bundle branch block he underwent CRT-D implantation with a Medtronic device and a 6947 defibrillator lead. He had a lazerus-like effect.   He also has a history of atrial fibrillation For this he was treated with amiodarone. Surveillance labs   Normal   NO BLEEEDING  Digoxin level 8/15 was 0.8  echocardiogram 8/14 was 25-30%  Echocardiogram 2/16 EF 35-40%  8/17  TSH 1.8 ALT 16  Dig o.3  K 3.8  He was hospitalized Saturday night when he presented because of persistent tachycardia. He was found to be an atrial tachycardia and the device was reprogrammed DDD---DDI. His exercise intolerance and fatigue which have been relentless have been worse over recent months and did not improve with 3 reprogramming  records were reviewed  Notably as relates to amiodarone he has not had a cough  Past Medical History:  Diagnosis Date  . Anemia, unspecified 04/07/2013  . Atrial fibrillation (HCC)    fibrillation/flutter  . BPH (benign prostatic hypertrophy)   . Chronic systolic CHF (congestive heart failure) (HCC)    Nonischemic DCM EF 10% s/p BiV AICD  . Coronary artery disease 2008   nonobstructive ASCAD  . Depression   . Hyperlipidemia   . Hypertension   . Hypothyroidism   . Left bundle branch block   . Nonischemic cardiomyopathy (HCC)    ef 10 %  . OSA (obstructive sleep apnea)    intolerant to CPAP  . Osteoarthritis   . Sleep apnea    stopped using CPAP  . Thrombocytopenia (HCC)     Past Surgical History:  Procedure Laterality Date  . CARDIAC DEFIBRILLATOR PLACEMENT     left chest  .  CARDIOVERSION N/A 12/29/2012   Procedure: CARDIOVERSION;  Surgeon: Quintella Reichertraci R Turner, MD;  Location: [facility];  Service: Cardiovascular;  Laterality: N/A;  . CHOLECYSTECTOMY    . Colonscopy     reportedly negative per pt; around 2005.    Marland Kitchen. TRANSURETHRAL RESECTION OF PROSTATE     for BPH    Current Outpatient Prescriptions  Medication Sig Dispense Refill  . acetaminophen (TYLENOL) 650 MG CR tablet Take 1 tablet (650 mg total) by mouth every 8 (eight) hours as needed for pain. 90 tablet 3  . amiodarone (PACERONE) 200 MG tablet Take 0.5 tablets (100 mg total) by mouth daily. (Patient taking differently: Take 100 mg by mouth at bedtime. ) 45 tablet 1  . carvedilol (COREG) 3.125 MG tablet Take 1 tablet (3.125 mg total) by mouth 2 (two) times daily. 180 tablet 1  . DIGOX 125 MCG tablet Take 1 tablet by mouth  every other day 45 tablet 0  . levothyroxine (SYNTHROID, LEVOTHROID) 125 MCG tablet Take 1 tablet (125 mcg total) by mouth daily before breakfast. 90 tablet 1  . potassium chloride SA (K-DUR,KLOR-CON) 20 MEQ tablet Take 20 mEq by mouth daily as needed (with each torsemide dose).     . ramipril (ALTACE) 2.5 MG capsule Take 1 capsule by mouth  daily (Patient taking differently: Take 1 capsule by mouth  daily at bedtime) 90 capsule 0  . simvastatin (ZOCOR) 10 MG tablet Take  1 tablet (10 mg total) by mouth every evening. (Patient taking differently: Take 10 mg by mouth at bedtime. ) 90 tablet 2  . spironolactone (ALDACTONE) 25 MG tablet Take 0.5 tablets (12.5 mg total) by mouth daily. NEED FOLLOW UP APPOINTMENT FOR MORE REFILLS 45 tablet 0  . torsemide (DEMADEX) 20 MG tablet Take 20 mg by mouth daily as needed (weight of 215 lbs or more).     . warfarin (COUMADIN) 5 MG tablet Take 1 tablet by mouth once daily at 6PM (Patient taking differently: take 1 1/2 tablet by mouth on Saturday at bedtime and 1 tablet all other days of the week at bedtime) 90 tablet 0   No current facility-administered  medications for this visit.     No Known Allergies  Review of Systems negative except from HPI and PMH  Physical Exam Ht 6\' 3"  (1.905 m)  Well developed and well nourished in no acute distress HENT normal E scleral and icterus clear Neck Supple JVP flat; carotids brisk and full Clear to ausculation  Regular rate and rhythm, no murmurs gallops or rub Soft with active bowel sounds No clubbing cyanosis 1+Edema Alert and oriented, grossly normal motor and sensory function Skin Warm and Dry  AV pacing   Assessment and  Plan  Nonischemic cardiomyopathy   Congestive heart failure-chronic-systolic   Atrial fibrillation-paroxysmal  Monitoring for high-risk medications   Implantable defibrillator-CRT-Medtronic    Amiodarone surveillance laboratories were normal.  Atrial tachycardia and loss of AV synchrony may be contributing to his progressive exercise intolerance; we will increase his amiodarone--400 twice a day 2 weeks>>> 400 daily 4 weeks And decrease to 200 mg a day We will then reassess if symptoms are not improved we will anticipate a repeat echocardiogram

## 2016-02-09 NOTE — Patient Instructions (Signed)
Medication Instructions: Your physician has recommended you make the following change in your medication:   1. START Amiodarone 2 tablets (400 mg) by mouth twice daily for 2 weeks. Then take 2 tablets (400 mg) by mouth once daily for 4 weeks.  Then take 1 tablet (200 mg) by mouth once daily    Labwork: None Ordered  Procedures/Testing: None Ordered  Follow-Up: Your physician recommends that you schedule a follow-up appointment in 2-3 MONTHS with Gypsy Balsam, NP   Any Additional Special Instructions Will Be Listed Below (If Applicable).     If you need a refill on your cardiac medications before your next appointment, please call your pharmacy.

## 2016-02-13 LAB — CUP PACEART INCLINIC DEVICE CHECK
Battery Voltage: 2.63 V
Brady Statistic AP VP Percent: 42.75 %
Brady Statistic AS VS Percent: 1.83 %
Brady Statistic RA Percent Paced: 42.92 %
Brady Statistic RV Percent Paced: 98 %
Date Time Interrogation Session: 20170817184741
HIGH POWER IMPEDANCE MEASURED VALUE: 342 Ohm
HighPow Impedance: 41 Ohm
HighPow Impedance: 54 Ohm
Implantable Lead Implant Date: 20080806
Implantable Lead Implant Date: 20080806
Implantable Lead Model: 4076
Implantable Lead Model: 6947
Lead Channel Impedance Value: 456 Ohm
Lead Channel Impedance Value: 570 Ohm
Lead Channel Pacing Threshold Pulse Width: 0.4 ms
Lead Channel Sensing Intrinsic Amplitude: 0.5 mV
Lead Channel Setting Pacing Amplitude: 2 V
Lead Channel Setting Pacing Amplitude: 2.5 V
Lead Channel Setting Sensing Sensitivity: 0.3 mV
MDC IDC LEAD IMPLANT DT: 20080806
MDC IDC LEAD LOCATION: 753858
MDC IDC LEAD LOCATION: 753859
MDC IDC LEAD LOCATION: 753860
MDC IDC MSMT LEADCHNL LV IMPEDANCE VALUE: 1045 Ohm
MDC IDC MSMT LEADCHNL LV IMPEDANCE VALUE: 665 Ohm
MDC IDC MSMT LEADCHNL LV PACING THRESHOLD AMPLITUDE: 0.75 V
MDC IDC MSMT LEADCHNL LV PACING THRESHOLD PULSEWIDTH: 0.4 ms
MDC IDC MSMT LEADCHNL RA IMPEDANCE VALUE: 399 Ohm
MDC IDC MSMT LEADCHNL RA PACING THRESHOLD AMPLITUDE: 0.5 V
MDC IDC MSMT LEADCHNL RA PACING THRESHOLD PULSEWIDTH: 0.4 ms
MDC IDC MSMT LEADCHNL RA SENSING INTR AMPL: 0.75 mV
MDC IDC MSMT LEADCHNL RV PACING THRESHOLD AMPLITUDE: 0.75 V
MDC IDC MSMT LEADCHNL RV SENSING INTR AMPL: 9.125 mV
MDC IDC MSMT LEADCHNL RV SENSING INTR AMPL: 9.625 mV
MDC IDC SET LEADCHNL LV PACING PULSEWIDTH: 0.4 ms
MDC IDC SET LEADCHNL RV PACING AMPLITUDE: 2.5 V
MDC IDC SET LEADCHNL RV PACING PULSEWIDTH: 0.4 ms
MDC IDC STAT BRADY AP VS PERCENT: 0.17 %
MDC IDC STAT BRADY AS VP PERCENT: 55.25 %

## 2016-02-15 ENCOUNTER — Ambulatory Visit (HOSPITAL_BASED_OUTPATIENT_CLINIC_OR_DEPARTMENT_OTHER)
Admission: RE | Admit: 2016-02-15 | Discharge: 2016-02-15 | Disposition: A | Discharge status: Home / Self Care | Payer: Medicare Other | Source: Ambulatory Visit | Attending: Osteopathic Medicine | Admitting: Osteopathic Medicine

## 2016-02-15 DIAGNOSIS — R0609 Other forms of dyspnea: Secondary | ICD-10-CM | POA: Diagnosis not present

## 2016-02-15 DIAGNOSIS — I340 Nonrheumatic mitral (valve) insufficiency: Secondary | ICD-10-CM | POA: Diagnosis not present

## 2016-02-15 DIAGNOSIS — I110 Hypertensive heart disease with heart failure: Secondary | ICD-10-CM | POA: Insufficient documentation

## 2016-02-15 DIAGNOSIS — I447 Left bundle-branch block, unspecified: Secondary | ICD-10-CM | POA: Insufficient documentation

## 2016-02-15 DIAGNOSIS — I351 Nonrheumatic aortic (valve) insufficiency: Secondary | ICD-10-CM | POA: Insufficient documentation

## 2016-02-15 DIAGNOSIS — I509 Heart failure, unspecified: Secondary | ICD-10-CM | POA: Diagnosis not present

## 2016-02-15 DIAGNOSIS — R0600 Dyspnea, unspecified: Secondary | ICD-10-CM | POA: Diagnosis present

## 2016-02-15 NOTE — Progress Notes (Signed)
Echocardiogram 2D Echocardiogram has been performed.  Dorothey Baseman 02/15/2016, 11:59 AM

## 2016-02-16 ENCOUNTER — Telehealth: Payer: Self-pay | Admitting: Cardiology

## 2016-02-16 MED ORDER — TORSEMIDE 20 MG PO TABS: 20.0000 mg | ORAL_TABLET | Freq: Every day | ORAL | 10 refills | Status: DC | PRN

## 2016-02-16 MED ORDER — POTASSIUM CHLORIDE CRYS ER 20 MEQ PO TBCR: 20.0000 meq | EXTENDED_RELEASE_TABLET | Freq: Every day | ORAL | 10 refills | Status: DC | PRN

## 2016-02-16 NOTE — Telephone Encounter (Signed)
Instructed patient to never take expired medications.  Potassium and torsemide called in to [facility]. Patient agrees with treatment plan.

## 2016-02-16 NOTE — Telephone Encounter (Signed)
New message       Pt states has 1 bottle of potassium and 1 of torsemide and the pt states the each bottle has expired. Pt is unsure as to whether or not he should keep using the med. Please advise

## 2016-02-18 ENCOUNTER — Other Ambulatory Visit: Payer: Self-pay | Admitting: Family Medicine

## 2016-02-18 ENCOUNTER — Telehealth: Payer: Self-pay | Admitting: Physician Assistant

## 2016-02-18 NOTE — Telephone Encounter (Signed)
[name] [name] was recently placed on amiodarone. He has 200 mg tablets. He is to take 2 tablets twice a day for 2 weeks, then 2 tablets daily for month. He has been on the medication for a little over a week.  [name] [name] is having problems getting up and down. His legs are hurting him. They're not unusually swollen. She does not feel his shortness of breath is any worse than usual. It is mainly the leg pain. There is no numbness or discoloration.  I advised her that it would be okay to go ahead and decrease the amiodarone to 2 tablets daily. If this does not improve his symptoms, call us on Monday. He is not having any palpitations and she has no recent bleeding he's gone back into atrial fibrillation. She is agreeable to this plan.

## 2016-02-20 ENCOUNTER — Encounter: Payer: Self-pay | Admitting: Sports Medicine

## 2016-02-20 ENCOUNTER — Ambulatory Visit (INDEPENDENT_AMBULATORY_CARE_PROVIDER_SITE_OTHER): Payer: Medicare Other | Admitting: Sports Medicine

## 2016-02-20 DIAGNOSIS — M170 Bilateral primary osteoarthritis of knee: Secondary | ICD-10-CM | POA: Diagnosis not present

## 2016-02-20 NOTE — Progress Notes (Signed)
  Subjective:    CC: Bilateral knee pain  HPI: This is a very pleasant 82 year old male with known bilateral knee osteoarthritis, he did not respond to Visco supplementation. History recent injection was months ago, we did a arthrocentesis recently. At this point he is having persistence of pain in both knees and desires repeat interventional treatment bilaterally, pain is moderate, persistent, localized at the medial joint line on both sides without radiation.  Past medical history, Surgical history, Family history not pertinant except as noted below, Social history, Allergies, and medications have been entered into the medical record, reviewed, and no changes needed.   Review of Systems: No fevers, chills, night sweats, weight loss, chest pain, or shortness of breath.   Objective:    General: Well Developed, well nourished, and in no acute distress.  Neuro: Alert and oriented x3, extra-ocular muscles intact, sensation grossly intact.  HEENT: Normocephalic, atraumatic, pupils equal round reactive to light, neck supple, no masses, no lymphadenopathy, thyroid nonpalpable.  Skin: Warm and dry, no rashes. Cardiac: Regular rate and rhythm, no murmurs rubs or gallops, no lower extremity edema.  Respiratory: Clear to auscultation bilaterally. Not using accessory muscles, speaking in full sentences.  Procedure: Real-time Ultrasound Guided Injection of left knee Device: GE Logiq E  Verbal informed consent obtained.  Time-out conducted.  Noted no overlying erythema, induration, or other signs of local infection.  Skin prepped in a sterile fashion.  Local anesthesia: Topical Ethyl chloride.  With sterile technique and under real time ultrasound guidance:  Aspirated 20 mL straw-colored fluid, syringe switched and 1 mL kenalog 40, 2 mL lidocaine, 2 mL Marcaine injected easily Completed without difficulty  Pain immediately resolved suggesting accurate placement of the medication.  Advised to call if  fevers/chills, erythema, induration, drainage, or persistent bleeding.  Images permanently stored and available for review in the ultrasound unit.  Impression: Technically successful ultrasound guided injection.  Procedure: Real-time Ultrasound Guided Injection of right knee Device: GE Logiq E  Verbal informed consent obtained.  Time-out conducted.  Noted no overlying erythema, induration, or other signs of local infection.  Skin prepped in a sterile fashion.  Local anesthesia: Topical Ethyl chloride.  With sterile technique and under real time ultrasound guidance:  1 mL kenalog 40, 2 mL lidocaine, 2 mL Marcaine injected easily Completed without difficulty  Pain immediately resolved suggesting accurate placement of the medication.  Advised to call if fevers/chills, erythema, induration, drainage, or persistent bleeding.  Images permanently stored and available for review in the ultrasound unit.  Impression: Technically successful ultrasound guided injection.  Impression and Recommendations:    Primary osteoarthritis of both knees Bilateral knee injection as above, return as needed.

## 2016-02-20 NOTE — Assessment & Plan Note (Signed)
Bilateral knee injection as above, return as needed. 

## 2016-02-24 ENCOUNTER — Encounter: Payer: Self-pay | Admitting: Internal Medicine

## 2016-03-06 ENCOUNTER — Ambulatory Visit (INDEPENDENT_AMBULATORY_CARE_PROVIDER_SITE_OTHER): Payer: Medicare Other | Admitting: Osteopathic Medicine

## 2016-03-06 ENCOUNTER — Ambulatory Visit (INDEPENDENT_AMBULATORY_CARE_PROVIDER_SITE_OTHER): Payer: Medicare Other | Admitting: *Deleted

## 2016-03-06 VITALS — BP 109/68 | HR 77 | Temp 97.8°F

## 2016-03-06 DIAGNOSIS — Z23 Encounter for immunization: Secondary | ICD-10-CM | POA: Diagnosis not present

## 2016-03-06 DIAGNOSIS — I429 Cardiomyopathy, unspecified: Secondary | ICD-10-CM | POA: Diagnosis not present

## 2016-03-06 DIAGNOSIS — I480 Paroxysmal atrial fibrillation: Secondary | ICD-10-CM | POA: Diagnosis not present

## 2016-03-06 DIAGNOSIS — I428 Other cardiomyopathies: Secondary | ICD-10-CM

## 2016-03-06 LAB — POCT INR: INR: 3.6

## 2016-03-06 NOTE — Progress Notes (Signed)
Remote ICD transmission.   

## 2016-03-06 NOTE — Progress Notes (Signed)
BP 109/68   Pulse 77   Temp 97.8 F (36.6 C) (Oral)

## 2016-03-06 NOTE — Progress Notes (Signed)
[name] [name] presents to clinic for coumadin and flu shot.  Denies bruises, missed doses, changes in medication and diet. Flu shot: Pt denies allergies to latex and eggs and past allergic reaction to flu vaccine. Pt tolerated injection well in the left deltoid with any complications. Pt advised to f/u as needed.

## 2016-03-07 NOTE — Progress Notes (Signed)
Pt notified of medication recommendations

## 2016-03-08 ENCOUNTER — Encounter: Payer: Self-pay | Admitting: Cardiology

## 2016-03-10 ENCOUNTER — Other Ambulatory Visit: Payer: Self-pay | Admitting: Internal Medicine

## 2016-03-10 ENCOUNTER — Other Ambulatory Visit: Payer: Self-pay | Admitting: Cardiology

## 2016-03-12 ENCOUNTER — Other Ambulatory Visit: Payer: Self-pay | Admitting: *Deleted

## 2016-03-12 MED ORDER — SPIRONOLACTONE 25 MG PO TABS: 12.5000 mg | ORAL_TABLET | Freq: Every day | ORAL | 3 refills | Status: DC

## 2016-03-15 LAB — CUP PACEART REMOTE DEVICE CHECK
Battery Voltage: 2.62 V
Brady Statistic AP VP Percent: 97.39 %
Brady Statistic AS VP Percent: 2.58 %
Brady Statistic AS VS Percent: 0.02 %
Date Time Interrogation Session: 20170912084227
HIGH POWER IMPEDANCE MEASURED VALUE: 380 Ohm
HIGH POWER IMPEDANCE MEASURED VALUE: 41 Ohm
HighPow Impedance: 50 Ohm
Implantable Lead Implant Date: 20080806
Implantable Lead Implant Date: 20080806
Implantable Lead Model: 4076
Implantable Lead Model: 6947
Lead Channel Impedance Value: 456 Ohm
Lead Channel Impedance Value: 456 Ohm
Lead Channel Impedance Value: 513 Ohm
Lead Channel Impedance Value: 836 Ohm
Lead Channel Pacing Threshold Amplitude: 0.625 V
Lead Channel Pacing Threshold Amplitude: 1.25 V
Lead Channel Pacing Threshold Pulse Width: 0.4 ms
Lead Channel Pacing Threshold Pulse Width: 0.4 ms
Lead Channel Sensing Intrinsic Amplitude: 1.125 mV
Lead Channel Sensing Intrinsic Amplitude: 9.625 mV
Lead Channel Setting Pacing Amplitude: 2 V
Lead Channel Setting Pacing Amplitude: 2.5 V
Lead Channel Setting Pacing Pulse Width: 0.4 ms
Lead Channel Setting Sensing Sensitivity: 0.3 mV
MDC IDC LEAD IMPLANT DT: 20080806
MDC IDC LEAD LOCATION: 753858
MDC IDC LEAD LOCATION: 753859
MDC IDC LEAD LOCATION: 753860
MDC IDC MSMT LEADCHNL LV PACING THRESHOLD PULSEWIDTH: 0.4 ms
MDC IDC MSMT LEADCHNL RA IMPEDANCE VALUE: 399 Ohm
MDC IDC MSMT LEADCHNL RA SENSING INTR AMPL: 1.125 mV
MDC IDC MSMT LEADCHNL RV PACING THRESHOLD AMPLITUDE: 0.75 V
MDC IDC MSMT LEADCHNL RV SENSING INTR AMPL: 9.125 mV
MDC IDC SET LEADCHNL LV PACING PULSEWIDTH: 0.4 ms
MDC IDC SET LEADCHNL RV PACING AMPLITUDE: 2.5 V
MDC IDC STAT BRADY AP VS PERCENT: 0 %
MDC IDC STAT BRADY RA PERCENT PACED: 97.4 %
MDC IDC STAT BRADY RV PERCENT PACED: 99.98 %

## 2016-03-16 ENCOUNTER — Encounter: Payer: Self-pay | Admitting: Nurse Practitioner

## 2016-03-19 ENCOUNTER — Telehealth: Payer: Self-pay | Admitting: Cardiology

## 2016-03-19 ENCOUNTER — Ambulatory Visit (INDEPENDENT_AMBULATORY_CARE_PROVIDER_SITE_OTHER): Payer: Medicare Other

## 2016-03-19 ENCOUNTER — Encounter: Payer: Self-pay | Admitting: Sports Medicine

## 2016-03-19 ENCOUNTER — Ambulatory Visit: Payer: Medicare Other

## 2016-03-19 ENCOUNTER — Other Ambulatory Visit: Payer: Self-pay | Admitting: Internal Medicine

## 2016-03-19 ENCOUNTER — Ambulatory Visit (INDEPENDENT_AMBULATORY_CARE_PROVIDER_SITE_OTHER): Payer: Medicare Other | Admitting: Sports Medicine

## 2016-03-19 DIAGNOSIS — M5136 Other intervertebral disc degeneration, lumbar region: Secondary | ICD-10-CM

## 2016-03-19 DIAGNOSIS — M170 Bilateral primary osteoarthritis of knee: Secondary | ICD-10-CM | POA: Diagnosis not present

## 2016-03-19 DIAGNOSIS — I714 Abdominal aortic aneurysm, without rupture, unspecified: Secondary | ICD-10-CM

## 2016-03-19 DIAGNOSIS — M4806 Spinal stenosis, lumbar region: Secondary | ICD-10-CM | POA: Diagnosis not present

## 2016-03-19 DIAGNOSIS — I480 Paroxysmal atrial fibrillation: Secondary | ICD-10-CM | POA: Diagnosis not present

## 2016-03-19 DIAGNOSIS — M48061 Spinal stenosis, lumbar region without neurogenic claudication: Secondary | ICD-10-CM

## 2016-03-19 DIAGNOSIS — M8588 Other specified disorders of bone density and structure, other site: Secondary | ICD-10-CM | POA: Diagnosis not present

## 2016-03-19 DIAGNOSIS — Z1321 Encounter for screening for nutritional disorder: Secondary | ICD-10-CM | POA: Diagnosis not present

## 2016-03-19 LAB — POCT INR: INR: 3.4

## 2016-03-19 MED ORDER — PREDNISONE 50 MG PO TABS: ORAL_TABLET | ORAL | 0 refills | Status: DC

## 2016-03-19 MED ORDER — RAMIPRIL 2.5 MG PO CAPS: 2.5000 mg | ORAL_CAPSULE | Freq: Every day | ORAL | 3 refills | Status: DC

## 2016-03-19 NOTE — Addendum Note (Signed)
Addended by: Monica Becton on: 03/19/2016 04:51 PM   Modules accepted: Orders

## 2016-03-19 NOTE — Telephone Encounter (Signed)
°*  STAT* If patient is at the pharmacy, call can be transferred to refill team.   1. Which medications need to be refilled? (please list name of each medication and dose if known) Ramipril and Digoxin  2. Which pharmacy/location (including street and city if local pharmacy) is medication to be sent to?[facility]   3. Do they need a 30 day or 90 day supply? 90

## 2016-03-19 NOTE — Telephone Encounter (Signed)
Called pt to inform him that he has refill left on his Digox and that I sent in a refill for his ramipril and if he has any other problems, questions or concerns to call the office. Pt verbalized understanding.

## 2016-03-19 NOTE — Assessment & Plan Note (Signed)
Pain is better after bilateral knee injections. Predominantly his symptoms now are of poor balance. Checking B12 levels, lumbar spine x-ray, prednisone. I do suspect some of this is from lumbar spinal stenosis.

## 2016-03-19 NOTE — Assessment & Plan Note (Signed)
Unruptured but we will need yearly ultrasounds for tracking.

## 2016-03-19 NOTE — Assessment & Plan Note (Addendum)
Pain is better after bilateral knee injections. Predominantly his symptoms now are of poor balance. Checking B12 levels, lumbar spine x-ray, prednisone. I do suspect some of this is from lumbar spinal stenosis. adding home health physical therapy.

## 2016-03-19 NOTE — Progress Notes (Signed)
  Subjective:    CC: bilateral knee pain  HPI: 82 yo with history of bilateral knee OA presenting for evaluation of bilateral knee pain.  Patient says he continues to have bilateral knee pain despite injections one month ago.  He says pain is persistent and worse when he is walking.  He says pain radiates from his knees down to his legs.  He describes it as a throbbing pain, but says a numbness will spread down his legs as well.  He has been feeling more unsteady on his feet recently.  Pain is better when leaning over.  He also mentions that he discontinued his gabapentin one month ago because he did not feel like it helps.  He is not currently taking anything for pain.  He is also on a higher dose of amiodarone per cardiologist, and has been feeling lightheaded - he is in close communication with his cardiologist for these symptoms.  He is not interested in discussing knee replacement surgery at this time given his age and heart conditions.   Past medical history:  Negative.  See flowsheet/record as well for more information.  Surgical history: Negative.  See flowsheet/record as well for more information.  Family history: Negative.  See flowsheet/record as well for more information.  Social history: Negative.  See flowsheet/record as well for more information.  Allergies, and medications have been entered into the medical record, reviewed, and no changes needed.   Review of Systems: No fevers, chills, night sweats, weight loss, chest pain, or shortness of breath.   Objective:    General: Well Developed, well nourished, and in no acute distress.  Neuro: Alert and oriented x3, extra-ocular muscles intact, sensation grossly intact.  HEENT: Normocephalic, atraumatic, pupils equal round reactive to light, neck supple, no masses, no lymphadenopathy, thyroid nonpalpable.  Skin: Warm and dry, no rashes. Cardiac: Regular rate and rhythm, no murmurs rubs or gallops, no lower extremity edema.    Respiratory: Clear to auscultation bilaterally. Not using accessory muscles, speaking in full sentences. R, L Knee: Normal to inspection with no erythema or effusion or obvious bony abnormalities. Palpation normal with no warmth, joint line tenderness, patellar tenderness, or condyle tenderness. ROM full in flexion and extension and lower leg rotation. Ligaments with solid consistent endpoints including ACL, PCL, LCL, MCL. Negative Mcmurray's, Apley's, and Thessalonian tests. Non painful patellar compression. Patellar glide without crepitus. Patellar and quadriceps tendons unremarkable. Hamstring and quadriceps strength is normal.      Impression and Recommendations:    1. Bilateral knee OA and neuropathy, likely component of spinal stenosis  -L-spine X-rays -Course of prednisone -Check B12 levels to r/o B12 deficiency

## 2016-03-20 ENCOUNTER — Other Ambulatory Visit: Payer: Self-pay

## 2016-03-20 ENCOUNTER — Telehealth: Payer: Self-pay

## 2016-03-20 DIAGNOSIS — M48061 Spinal stenosis, lumbar region without neurogenic claudication: Secondary | ICD-10-CM

## 2016-03-20 LAB — FOLATE: Folate: 9.2 ng/mL (ref >5.4)

## 2016-03-20 LAB — VITAMIN B12: Vitamin B-12: 343 pg/mL (ref 200–1100)

## 2016-03-20 MED ORDER — PREDNISONE 50 MG PO TABS: ORAL_TABLET | ORAL | 0 refills | Status: DC

## 2016-03-20 NOTE — Telephone Encounter (Signed)
Patient called and wanted to the know if there are any changes on his Coumadin medication. Please advise if patient should continue current dose or if it will be changed. Rhonda Cunningham,CMA

## 2016-03-20 NOTE — Addendum Note (Signed)
Addended by: Monica Becton on: 03/20/2016 01:47 PM   Modules accepted: Orders

## 2016-03-21 NOTE — Telephone Encounter (Signed)
PATIENT CALLED AGAIN AND STATED THAT HE HAD INR CHECKED ON 03/19/16. HE STATED THAT HE WAS NOT CALLED OR TOLD WHAT HIS ADJUSTMENTS WOULD BE. PLEASE DOCUMENT INSTRUCTIONS ON WHAT THE PATIENT SHOULD BE TAKING. Yeray Tomas,CMA

## 2016-03-22 ENCOUNTER — Emergency Department (HOSPITAL_COMMUNITY)
Admission: EM | Admit: 2016-03-22 | Discharge: 2016-03-22 | Disposition: A | Discharge status: Home / Self Care | Payer: Medicare Other | Attending: Emergency Medicine | Admitting: Emergency Medicine

## 2016-03-22 ENCOUNTER — Encounter (HOSPITAL_COMMUNITY): Payer: Self-pay | Admitting: Emergency Medicine

## 2016-03-22 ENCOUNTER — Emergency Department (HOSPITAL_COMMUNITY): Payer: Medicare Other

## 2016-03-22 DIAGNOSIS — I110 Hypertensive heart disease with heart failure: Secondary | ICD-10-CM | POA: Diagnosis not present

## 2016-03-22 DIAGNOSIS — Y929 Unspecified place or not applicable: Secondary | ICD-10-CM | POA: Diagnosis not present

## 2016-03-22 DIAGNOSIS — S0181XA Laceration without foreign body of other part of head, initial encounter: Secondary | ICD-10-CM | POA: Diagnosis not present

## 2016-03-22 DIAGNOSIS — M79642 Pain in left hand: Secondary | ICD-10-CM | POA: Diagnosis not present

## 2016-03-22 DIAGNOSIS — S199XXA Unspecified injury of neck, initial encounter: Secondary | ICD-10-CM | POA: Diagnosis not present

## 2016-03-22 DIAGNOSIS — Z7901 Long term (current) use of anticoagulants: Secondary | ICD-10-CM | POA: Insufficient documentation

## 2016-03-22 DIAGNOSIS — S098XXA Other specified injuries of head, initial encounter: Secondary | ICD-10-CM | POA: Diagnosis not present

## 2016-03-22 DIAGNOSIS — S0990XA Unspecified injury of head, initial encounter: Secondary | ICD-10-CM | POA: Diagnosis present

## 2016-03-22 DIAGNOSIS — I5022 Chronic systolic (congestive) heart failure: Secondary | ICD-10-CM | POA: Insufficient documentation

## 2016-03-22 DIAGNOSIS — E039 Hypothyroidism, unspecified: Secondary | ICD-10-CM | POA: Insufficient documentation

## 2016-03-22 DIAGNOSIS — W2209XA Striking against other stationary object, initial encounter: Secondary | ICD-10-CM | POA: Insufficient documentation

## 2016-03-22 DIAGNOSIS — Z79899 Other long term (current) drug therapy: Secondary | ICD-10-CM | POA: Insufficient documentation

## 2016-03-22 DIAGNOSIS — S0083XA Contusion of other part of head, initial encounter: Secondary | ICD-10-CM | POA: Diagnosis not present

## 2016-03-22 DIAGNOSIS — Z95810 Presence of automatic (implantable) cardiac defibrillator: Secondary | ICD-10-CM | POA: Insufficient documentation

## 2016-03-22 DIAGNOSIS — Y999 Unspecified external cause status: Secondary | ICD-10-CM | POA: Insufficient documentation

## 2016-03-22 DIAGNOSIS — Y939 Activity, unspecified: Secondary | ICD-10-CM | POA: Diagnosis not present

## 2016-03-22 DIAGNOSIS — I2510 Atherosclerotic heart disease of native coronary artery without angina pectoris: Secondary | ICD-10-CM | POA: Insufficient documentation

## 2016-03-22 DIAGNOSIS — W19XXXA Unspecified fall, initial encounter: Secondary | ICD-10-CM

## 2016-03-22 DIAGNOSIS — S6992XA Unspecified injury of left wrist, hand and finger(s), initial encounter: Secondary | ICD-10-CM | POA: Diagnosis not present

## 2016-03-22 LAB — CBC WITH DIFFERENTIAL/PLATELET
Basophils Absolute: 0 10*3/uL (ref 0.0–0.1)
Basophils Relative: 0 %
EOS ABS: 0 10*3/uL (ref 0.0–0.7)
EOS PCT: 0 %
HCT: 40.2 % (ref 39.0–52.0)
Hemoglobin: 12.9 g/dL — ABNORMAL LOW (ref 13.0–17.0)
LYMPHS ABS: 0.5 10*3/uL — AB (ref 0.7–4.0)
Lymphocytes Relative: 6 %
MCH: 30.6 pg (ref 26.0–34.0)
MCHC: 32.1 g/dL (ref 30.0–36.0)
MCV: 95.5 fL (ref 78.0–100.0)
MONO ABS: 0.2 10*3/uL (ref 0.1–1.0)
Monocytes Relative: 3 %
Neutro Abs: 7.8 10*3/uL — ABNORMAL HIGH (ref 1.7–7.7)
Neutrophils Relative %: 91 %
PLATELETS: 143 10*3/uL — AB (ref 150–400)
RBC: 4.21 MIL/uL — AB (ref 4.22–5.81)
RDW: 13.8 % (ref 11.5–15.5)
WBC: 8.6 10*3/uL (ref 4.0–10.5)

## 2016-03-22 LAB — PROTIME-INR
INR: 3.49
PROTHROMBIN TIME: 35.9 s — AB (ref 11.4–15.2)

## 2016-03-22 LAB — BASIC METABOLIC PANEL
Anion gap: 8 (ref 5–15)
BUN: 19 mg/dL (ref 6–20)
CHLORIDE: 103 mmol/L (ref 101–111)
CO2: 25 mmol/L (ref 22–32)
CREATININE: 1.47 mg/dL — AB (ref 0.61–1.24)
Calcium: 9.1 mg/dL (ref 8.9–10.3)
GFR calc Af Amer: 50 mL/min — ABNORMAL LOW (ref >60)
GFR, EST NON AFRICAN AMERICAN: 43 mL/min — AB (ref >60)
GLUCOSE: 197 mg/dL — AB (ref 65–99)
Potassium: 4.3 mmol/L (ref 3.5–5.1)
SODIUM: 136 mmol/L (ref 135–145)

## 2016-03-22 MED ORDER — BACITRACIN ZINC 500 UNIT/GM EX OINT
TOPICAL_OINTMENT | CUTANEOUS | Status: AC
  Administered 2016-03-22: 1 via TOPICAL

## 2016-03-22 MED ORDER — LIDOCAINE-EPINEPHRINE (PF) 2 %-1:200000 IJ SOLN
20.0000 mL | Freq: Once | INTRAMUSCULAR | Status: AC
  Administered 2016-03-22: 20 mL
  Filled 2016-03-22: qty 20

## 2016-03-22 NOTE — Telephone Encounter (Signed)
Spoke to patient wife and gave her information as noted below. Destyni Hoppel,CMA

## 2016-03-22 NOTE — Telephone Encounter (Signed)
ATTEMPTED TO CALL PATIENT UNABLE TO LEAVE A MESSAGE. [name] [name],CMA

## 2016-03-22 NOTE — Telephone Encounter (Signed)
Flowsheet updated, decreased to one half tab on Monday and one half tab on Thursday, one full tablet other days. Return in 2 weeks.

## 2016-03-22 NOTE — Discharge Instructions (Signed)
Read the information below.  You may return to the Emergency Department at any time for worsening condition or any new symptoms that concern you.  Please DO NOT take your coumadin tonight.  Resume tomorrow according to your primary care provider's instructions.   Your primary care provider as placed instructions in the chart that you are to take your coumadin as follows:  Take 1/2 tab on Monday and Thursday, and take 1 full tab all the other days of the week.    You have had a head injury which does not appear to require admission at this time. A concussion is a state of changed mental ability from trauma. SEEK IMMEDIATE MEDICAL ATTENTION IF: There is confusion or drowsiness (although children frequently become drowsy after injury).  You cannot awaken the injured person.  There is nausea (feeling sick to your stomach) or continued, forceful vomiting.  You notice dizziness or unsteadiness which is getting worse, or inability to walk.  You have convulsions or unconsciousness.  You experience severe, persistent headaches not relieved by Tylenol?. (Do not take aspirin as this impairs clotting abilities). Take other pain medications only as directed.  You cannot use arms or legs normally.  There are changes in pupil sizes. (This is the black center in the colored part of the eye)  There is clear or bloody discharge from the nose or ears.  Change in speech, vision, swallowing, or understanding.  Localized weakness, numbness, tingling, or change in bowel or bladder control.

## 2016-03-22 NOTE — ED Notes (Signed)
Patient transported to CT 

## 2016-03-22 NOTE — ED Triage Notes (Signed)
Per EMS pt fell 30 minutes ago at pain management center. Pt hit head and has scrapes on arm, pt has pacemaker and is normally on amiodarone and warfarin.

## 2016-03-22 NOTE — ED Provider Notes (Signed)
Medical screening examination/treatment/procedure(s) were conducted as a shared visit with non-physician practitioner(s) and myself.  I personally evaluated the patient during the encounter. Briefly, the patient is a 82 y.o. male fall on coumadin with head laceration. CT head and cervical spine negative. Plain film of the left hand without acute injuries. Wound was thoroughly irrigated and closed. Supratherapeutic INR. On review of records patient's primary care provider had noted changes to his Coumadin regimen yesterday. Patient instructed to follow these recommendations.   The patient is safe for discharge with strict return precautions.    EKG Interpretation  Date/Time:  Thursday March 22 2016 10:19:20 EDT Ventricular Rate:  71 PR Interval:    QRS Duration: 191 QT Interval:  493 QTC Calculation: 536 R Axis:   -115 Text Interpretation:  Atrial-ventricular dual-paced rhythm No further analysis attempted due to paced rhythm Confirmed by [facility] MD, Chala Gul ([id number]) on 03/22/2016 11:00:45 AM           Nira Conn, MD 03/23/16 [phone]

## 2016-03-22 NOTE — ED Provider Notes (Signed)
[facility]-EMER[facility]Y DEPT Provider Note   CSN: [id number] Arrival date & time: 03/22/16  1014     History   Chief Complaint Chief Complaint  Patient presents with  . Fall    HPI Thomasena EdisVernon E Denise is a 82 y.o. male.  HPI  Patient with hx Afib on coumadin and chronic problems with left leg pain and weakness presents with fall and head injury that occurred just prior to arrival.  States he stepped up onto a curb with his left foot, which he should not have done as he cannot support himself.  He fell, catching himself with his left hand and hitting his left forehead on the concrete.  Denies LOC, dizziness, confusion following the fall.  Denies weakness or numbness of the hand.  Denies any other injury.  Denies any pain in his left eye or change in vision. States he is up to date on his tetanus vaccination.   Past Medical History:  Diagnosis Date  . Anemia, unspecified 04/07/2013  . Atrial fibrillation (HCC)    fibrillation/flutter  . BPH (benign prostatic hypertrophy)   . Chronic systolic CHF (congestive heart failure) (HCC)    Nonischemic DCM EF 10% s/p BiV AICD  . Coronary artery disease 2008   nonobstructive ASCAD  . Depression   . Hyperlipidemia   . Hypertension   . Hypothyroidism   . Left bundle branch block   . Nonischemic cardiomyopathy (HCC)    ef 10 %  . OSA (obstructive sleep apnea)    intolerant to CPAP  . Osteoarthritis   . Sleep apnea    stopped using CPAP  . Thrombocytopenia [facility](HCC)     Patient Active Problem List   Diagnosis Date Noted  . Spinal stenosis of lumbar region 03/19/2016  . Abdominal aortic aneurysm (HCC) 03/19/2016  . Cholecystitis 02/04/2016  . Tachycardia 02/04/2016  . Primary osteoarthritis of left hip 11/25/2015  . Lung crackles 11/17/2014  . Lung nodule 11/17/2014  . Primary osteoarthritis of both knees 11/03/2014  . Abnormal PFTs (pulmonary function tests) 04/07/2014  . BPH (benign prostatic hyperplasia) 01/13/2014  . Chronic renal  insufficiency, stage III (moderate) 01/13/2014  . Chronic anticoagulation 04/09/2013  . HTN (hypertension) 04/09/2013  . CAD (coronary artery disease) 04/09/2013  . Anemia, unspecified 04/07/2013  . Chronic systolic heart failure (HCC) 02/17/2013  . Atrial tachycardia (HCC) 17-Sep-202014  . Nonischemic cardiomyopathy (HCC)   . Left bundle branch block   . Thrombocytopenia (HCC)   . Biventricular implantable cardioverter-defibrillator-Medtronic 01/10/2011  . Dyspnea on exertion 08/09/2010  . VENTRICULAR TACHYCARDIA 06/23/2009  . Hypothyroidism 06/22/2009  . Hyperlipidemia 06/22/2009  . DEPRESSION 06/22/2009  . FIBRILLATION, ATRIAL 06/22/2009  . OSA (obstructive sleep apnea) 06/22/2009    Past Surgical History:  Procedure Laterality Date  . CARDIAC DEFIBRILLATOR PLACEMENT     left chest  . CARDIOVERSION N/A 12/29/2012   Procedure: CARDIOVERSION;  Surgeon: Quintella Reichertraci R Turner, MD;  Location: [facility]OS[facility];  Service: Cardiovascular;  Laterality: N/A;  . CHOLECYSTECTOMY    . Colonscopy     reportedly negative per pt; around 2005.    Marland Kitchen. TRANSURETHRAL RESECTION OF PROSTATE     for BPH       Home Medications    Prior to Admission medications   Medication Sig Start Date End Date Taking? Authorizing Provider  acetaminophen (TYLENOL) 650 MG CR tablet Take 1 tablet (650 mg total) by mouth every 8 (eight) hours as needed for pain. 11/03/14  Yes Monica Bectonhomas J Thekkekandam, MD  amiodarone (PACERONE) 200  MG tablet Take 2 tablets (400 mg) by mouth twice daily x 2 weeks. Then 2 tablets (400 mg) by mouth once daily x 4 weeks. 02/09/16  Yes Duke Salvia, MD  carvedilol (COREG) 3.125 MG tablet Take 1 tablet (3.125 mg total) by mouth 2 (two) times daily. 11/11/15  Yes Quintella Reichert, MD  DIGOX 125 MCG tablet Take 1 tablet by mouth  every other day 03/12/16  Yes Duke Salvia, MD  levothyroxine (SYNTHROID, LEVOTHROID) 125 MCG tablet Take 1 tablet by mouth  daily before breakfast 02/20/16  Yes Sunnie Nielsen, DO   potassium chloride SA (K-DUR,KLOR-CON) 20 MEQ tablet Take 1 tablet (20 mEq total) by mouth daily as needed (with each torsemide dose). 02/16/16  Yes Quintella Reichert, MD  predniSONE (DELTASONE) 50 MG tablet One tab PO daily for 5 days. 03/20/16  Yes Monica Becton, MD  ramipril (ALTACE) 2.5 MG capsule Take 1 capsule (2.5 mg total) by mouth daily. 03/19/16  Yes Duke Salvia, MD  simvastatin (ZOCOR) 10 MG tablet Take 1 tablet by mouth  every evening 03/12/16  Yes Duke Salvia, MD  spironolactone (ALDACTONE) 25 MG tablet Take 0.5 tablets (12.5 mg total) by mouth daily. 03/12/16  Yes Quintella Reichert, MD  torsemide (DEMADEX) 20 MG tablet Take 1 tablet (20 mg total) by mouth daily as needed (weight of 215 lbs or more). 02/16/16  Yes Quintella Reichert, MD  warfarin (COUMADIN) 5 MG tablet Take 1 tablet by mouth once daily at [facility] Patient taking differently: take 1 1/2 tablet by mouth on Saturday at bedtime and 1 tablet all other days of the week at bedtime 11/07/15  Yes Laren Boom, DO    Family History Family History  Problem Relation Age of Onset  . Heart failure Mother   . Stroke Mother   . Heart disease Father   . Cancer Maternal Uncle     Unknown subtype  . Heart attack Neg Hx     Social History Social History  Substance Use Topics  . Smoking status: Never Smoker  . Smokeless tobacco: Never Used  . Alcohol use No     Allergies   Review of patient's allergies indicates no known allergies.   Review of Systems Review of Systems  All other systems reviewed and are negative.    Physical Exam Updated Vital Signs BP 136/78   Pulse 69   Temp 98.1 F (36.7 C) (Oral)   Resp 19   Ht 6\' 3"  (1.905 m)   Wt 95.3 kg   SpO2 96%   BMI 26.25 kg/m   Physical Exam  Constitutional: He appears well-developed and well-nourished. No distress.  HENT:  Head: Normocephalic.  Left forehead hematoma with overlying lacerations.   Eyes: Conjunctivae and EOM are normal. Pupils are equal, round,  and reactive to light. Right eye exhibits no discharge. Left eye exhibits no discharge. No scleral icterus.  Neck: Neck supple.  Cardiovascular: Normal rate and regular rhythm.   Pulmonary/Chest: Effort normal and breath sounds normal. No respiratory distress. He has no wheezes. He has no rales.  Abdominal: Soft. He exhibits no distension and no mass. There is no tenderness. There is no rebound and no guarding.  Musculoskeletal: He exhibits no deformity.  Mild tenderness over left 5th metacarpal.    No other focal bony tenderness throughout exam.    Neurological: He is alert. He exhibits normal muscle tone.  Skin: He is not diaphoretic.  Nursing note and vitals reviewed.  ED Treatments / Results  Labs (all labs ordered are listed, but only abnormal results are displayed) Labs Reviewed  BASIC METABOLIC PANEL - Abnormal; Notable for the following:       Result Value   Glucose, Bld 197 (*)    Creatinine, Ser 1.47 (*)    GFR calc non Af Amer 43 (*)    GFR calc Af Amer 50 (*)    All other components within normal limits  CBC WITH DIFFERENTIAL/PLATELET - Abnormal; Notable for the following:    RBC 4.21 (*)    Hemoglobin 12.9 (*)    Platelets 143 (*)    Neutro Abs 7.8 (*)    Lymphs Abs 0.5 (*)    All other components within normal limits  PROTIME-INR - Abnormal; Notable for the following:    Prothrombin Time 35.9 (*)    All other components within normal limits    EKG  EKG Interpretation  Date/Time:  Thursday March 22 2016 10:19:20 EDT Ventricular Rate:  71 PR Interval:    QRS Duration: 191 QT Interval:  493 QTC Calculation: 536 R Axis:   -115 Text Interpretation:  Atrial-ventricular dual-paced rhythm No further analysis attempted due to paced rhythm Confirmed by [facility] MD, PEDRO ([id number]) on 03/22/2016 11:00:45 AM       Radiology Ct Head Wo Contrast  Result Date: 03/22/2016 CLINICAL DATA:  Fall injuring left frontal skull. Patient is on blood thinning medication.  Sore neck after fall. EXAM: CT HEAD WITHOUT CONTRAST CT CERVICAL SPINE WITHOUT CONTRAST TECHNIQUE: Multidetector CT imaging of the head and cervical spine was performed following the standard protocol without intravenous contrast. Multiplanar CT image reconstructions of the cervical spine were also generated. COMPARISON:  Head CT 12/23/2013 FINDINGS: CT HEAD FINDINGS Brain: Ventricles, cisterns and other CSF spaces are within normal. There is evidence of chronic ischemic microvascular disease. There is no mass, mass effect, shift of midline structures or acute hemorrhage. No evidence of acute infarction. Vascular: Calcified plaque over the cavernous segment the internal carotid arteries. Minimal calcified plaque of the vertebral arteries. Skull: Within normal. Sinuses/Orbits: Globes and retrobulbar spaces are normal and symmetric. There is moderate soft tissue swelling over the left frontal skull and supraorbital/periorbital region. Sinuses are clear. CT CERVICAL SPINE FINDINGS Alignment: Vertebral body alignment is within normal. No evidence of traumatic subluxation. Skull base and vertebrae: Mild spondylosis of the lumbar spine. No evidence of compression fracture. Moderate uncovertebral joint spurring and facet arthropathy is present. Significant neural foraminal narrowing bilaterally at multiple levels left-greater-than-right due to adjacent bony spurring. Soft tissues and spinal canal: Prevertebral soft tissues are within normal. Spinal canal is unremarkable. Disc levels: Mild disc space narrowing at the C6-7 level. Suggestion of central disc herniation with resulting mild canal stenosis at the C3-4 level. Upper chest: Unremarkable. IMPRESSION: No acute intracranial findings. Moderate-sized left frontal scalp contusion with adjacent left supraorbital/periorbital soft tissue swelling. No associated fracture. Chronic ischemic microvascular disease. No acute cervical spine injury. Spondylosis of the cervical spine  with disc disease at the C6-7 level. Multilevel neural foraminal narrowing left greater than right. Suggestion of central disc herniation at the C3-4 level with resulting mild canal stenosis. Electronically Signed   By: Elberta Fortis M.D.   On: 03/22/2016 11:34   Ct Cervical Spine Wo Contrast  Result Date: 03/22/2016 CLINICAL DATA:  Fall injuring left frontal skull. Patient is on blood thinning medication. Sore neck after fall. EXAM: CT HEAD WITHOUT CONTRAST CT CERVICAL SPINE WITHOUT CONTRAST TECHNIQUE: Multidetector CT imaging  of the head and cervical spine was performed following the standard protocol without intravenous contrast. Multiplanar CT image reconstructions of the cervical spine were also generated. COMPARISON:  Head CT 12/23/2013 FINDINGS: CT HEAD FINDINGS Brain: Ventricles, cisterns and other CSF spaces are within normal. There is evidence of chronic ischemic microvascular disease. There is no mass, mass effect, shift of midline structures or acute hemorrhage. No evidence of acute infarction. Vascular: Calcified plaque over the cavernous segment the internal carotid arteries. Minimal calcified plaque of the vertebral arteries. Skull: Within normal. Sinuses/Orbits: Globes and retrobulbar spaces are normal and symmetric. There is moderate soft tissue swelling over the left frontal skull and supraorbital/periorbital region. Sinuses are clear. CT CERVICAL SPINE FINDINGS Alignment: Vertebral body alignment is within normal. No evidence of traumatic subluxation. Skull base and vertebrae: Mild spondylosis of the lumbar spine. No evidence of compression fracture. Moderate uncovertebral joint spurring and facet arthropathy is present. Significant neural foraminal narrowing bilaterally at multiple levels left-greater-than-right due to adjacent bony spurring. Soft tissues and spinal canal: Prevertebral soft tissues are within normal. Spinal canal is unremarkable. Disc levels: Mild disc space narrowing at the  C6-7 level. Suggestion of central disc herniation with resulting mild canal stenosis at the C3-4 level. Upper chest: Unremarkable. IMPRESSION: No acute intracranial findings. Moderate-sized left frontal scalp contusion with adjacent left supraorbital/periorbital soft tissue swelling. No associated fracture. Chronic ischemic microvascular disease. No acute cervical spine injury. Spondylosis of the cervical spine with disc disease at the C6-7 level. Multilevel neural foraminal narrowing left greater than right. Suggestion of central disc herniation at the C3-4 level with resulting mild canal stenosis. Electronically Signed   By: Elberta Fortis M.D.   On: 03/22/2016 11:34   Dg Hand Complete Left  Result Date: 03/22/2016 CLINICAL DATA:  Pain following fall EXAM: LEFT HAND - COMPLETE 3+ VIEW COMPARISON:  None. FINDINGS: Frontal, oblique, and lateral views were obtained. There is no demonstrable acute fracture or dislocation. There is mild joint space narrowing in the first MCP and IP joints. There is also mild osteoarthritic change in the fifth PIP joint. No erosive changes. There is an apparent small accessory ossicle lateral to the trapezium bone. No erosive changes. IMPRESSION: No acute fracture or dislocation. Areas of relatively mild osteoarthritic change. Apparent accessory ossicle lateral to the trapezium bone. Electronically Signed   By: Bretta Bang III M.D.   On: 03/22/2016 11:33    Procedures Procedures (including critical care time)  LACERATION REPAIR Performed by: Trixie Dredge Authorized by: Trixie Dredge Consent: Verbal consent obtained. Risks and benefits: risks, benefits and alternatives were discussed Consent given by: patient Patient identity confirmed: provided demographic data Prepped and Draped in normal sterile fashion Wound explored  Laceration Location: left forehead  Laceration Length: 4cm  No Foreign Bodies seen or palpated  Anesthesia: local infiltration  Local  anesthetic: lidocaine 2% with epinephrine  Anesthetic total: 10 ml  Irrigation method: syringe Amount of cleaning: standard  Skin closure: 5-0 vicryl plus  Number of sutures: 7  Technique: simple interrupted   Patient tolerance: Patient tolerated the procedure well with no immediate complications.   Medications Ordered in ED Medications  bacitracin ointment (not administered)  lidocaine-EPINEPHrine (XYLOCAINE W/EPI) 2 %-1:200000 (PF) injection 20 mL (20 mLs Infiltration Given 03/22/16 1222)     Initial Impression / Assessment and Plan / ED Course  I have reviewed the triage vital signs and the nursing notes.  Pertinent labs & imaging results that were available during my care of the patient  were reviewed by me and considered in my medical decision making (see chart for details).  Clinical Course    Afebrile, nontoxic patient with traumatic injury to forehead after mechanical fall.  Pt is on coumadin.  Large hematoma of left eye with evolving periorbital bruising.  Pt denies any eye pain specifically, doubt foreign body or orbit/globe injury.  CTs, hand negative for acute fracture, intracranial bleed.  Laceration repaired in ED.  Discussed home care and strict return precautions, particularly given INR 3.4.  Instructions given to skip coumadin tonight and resume tomorrow according to PCP instructions in chart (given to pt at discharge).  Pt also seen by and pt discussed with Dr Eudelia Bunch.  D/C home.  Discussed result, findings, treatment, and follow up  with patient.  Pt given return precautions.  Pt verbalizes understanding and agrees with plan.       Final Clinical Impressions(s) / ED Diagnoses   Final diagnoses:  Fall, initial encounter  Facial contusion, initial encounter  Forehead laceration, initial encounter  Traumatic hematoma of forehead, initial encounter    New Prescriptions New Prescriptions   No medications on file     [place], PA-C 03/22/16 1331

## 2016-03-22 NOTE — ED Notes (Signed)
MD at bedside. 

## 2016-03-23 ENCOUNTER — Ambulatory Visit (HOSPITAL_BASED_OUTPATIENT_CLINIC_OR_DEPARTMENT_OTHER): Admission: RE | Admit: 2016-03-23 | Payer: Medicare Other | Source: Ambulatory Visit

## 2016-03-29 ENCOUNTER — Encounter (HOSPITAL_COMMUNITY): Payer: Medicare Other

## 2016-03-30 ENCOUNTER — Other Ambulatory Visit: Payer: Self-pay | Admitting: Sports Medicine

## 2016-03-30 ENCOUNTER — Other Ambulatory Visit: Payer: Self-pay

## 2016-03-30 DIAGNOSIS — I714 Abdominal aortic aneurysm, without rupture, unspecified: Secondary | ICD-10-CM

## 2016-03-30 DIAGNOSIS — M48061 Spinal stenosis, lumbar region without neurogenic claudication: Secondary | ICD-10-CM

## 2016-04-02 ENCOUNTER — Ambulatory Visit (INDEPENDENT_AMBULATORY_CARE_PROVIDER_SITE_OTHER): Payer: Medicare Other | Admitting: Family Medicine

## 2016-04-02 ENCOUNTER — Encounter: Payer: Self-pay | Admitting: Family Medicine

## 2016-04-02 ENCOUNTER — Ambulatory Visit (HOSPITAL_BASED_OUTPATIENT_CLINIC_OR_DEPARTMENT_OTHER)
Admission: RE | Admit: 2016-04-02 | Discharge: 2016-04-02 | Disposition: A | Discharge status: Home / Self Care | Payer: Medicare Other | Source: Ambulatory Visit | Attending: Sports Medicine | Admitting: Sports Medicine

## 2016-04-02 VITALS — BP 115/68 | HR 73

## 2016-04-02 DIAGNOSIS — S0083XA Contusion of other part of head, initial encounter: Secondary | ICD-10-CM | POA: Diagnosis not present

## 2016-04-02 DIAGNOSIS — D696 Thrombocytopenia, unspecified: Secondary | ICD-10-CM

## 2016-04-02 DIAGNOSIS — I5022 Chronic systolic (congestive) heart failure: Secondary | ICD-10-CM

## 2016-04-02 DIAGNOSIS — I714 Abdominal aortic aneurysm, without rupture, unspecified: Secondary | ICD-10-CM

## 2016-04-02 DIAGNOSIS — I4891 Unspecified atrial fibrillation: Secondary | ICD-10-CM

## 2016-04-02 DIAGNOSIS — I428 Other cardiomyopathies: Secondary | ICD-10-CM

## 2016-04-02 DIAGNOSIS — I471 Supraventricular tachycardia: Secondary | ICD-10-CM | POA: Diagnosis not present

## 2016-04-02 LAB — POCT INR: INR: 2.7

## 2016-04-02 NOTE — Progress Notes (Signed)
[name] [name] is a 82 y.o. male [facility] today for fall. [name] [name] tripped and fell in late September hitting his face on the ground. He was seen in the emergency department were CT scans were normal. His INR was elevated as well. His lacerations were repaired and he was advised to stop taking his warfarin for a few days. He currently feels reasonably well with no chest pain or lightheadedness or dizziness. He is fallen several times in the last year however this is the first time he suffered a significant injury. He takes warfarin for atrial fibrillation.   Past Medical History:  Diagnosis Date  . Anemia, unspecified 04/07/2013  . Atrial fibrillation (HCC)    fibrillation/flutter  . BPH (benign prostatic hypertrophy)   . Chronic systolic CHF (congestive heart failure) (HCC)    Nonischemic DCM EF 10% s/p BiV AICD  . Coronary artery disease 2008   nonobstructive ASCAD  . Depression   . Hyperlipidemia   . Hypertension   . Hypothyroidism   . Left bundle branch block   . Nonischemic cardiomyopathy (HCC)    ef 10 %  . OSA (obstructive sleep apnea)    intolerant to CPAP  . Osteoarthritis   . Sleep apnea    stopped using CPAP  . Thrombocytopenia (HCC)    Past Surgical History:  Procedure Laterality Date  . CARDIAC DEFIBRILLATOR PLACEMENT     left chest  . CARDIOVERSION N/A 12/29/2012   Procedure: CARDIOVERSION;  Surgeon: Quintella Reichertraci R Turner, MD;  Location: [facility];  Service: Cardiovascular;  Laterality: N/A;  . CHOLECYSTECTOMY    . Colonscopy     reportedly negative per pt; around 2005.    Marland Kitchen. TRANSURETHRAL RESECTION OF PROSTATE     for BPH   Social History  Substance Use Topics  . Smoking status: Never Smoker  . Smokeless tobacco: Never Used  . Alcohol use No   family history includes Cancer in his maternal uncle; Heart disease in his  father; Heart failure in his mother; Stroke in his mother.  ROS as above:  Medications: Current Outpatient Prescriptions  Medication Sig Dispense Refill  . acetaminophen (TYLENOL) 650 MG CR tablet Take 1 tablet (650 mg total) by mouth every 8 (eight) hours as needed for pain. 90 tablet 3  . amiodarone (PACERONE) 200 MG tablet Take 2 tablets (400 mg) by mouth twice daily x 2 weeks. Then 2 tablets (400 mg) by mouth once daily x 4 weeks. 112 tablet 0  . carvedilol (COREG) 3.125 MG tablet Take 1 tablet (3.125 mg total) by mouth 2 (two) times daily. 180 tablet 1  . DIGOX 125 MCG tablet Take 1 tablet by mouth  every other day 45 tablet 3  . levothyroxine (SYNTHROID, LEVOTHROID) 125 MCG tablet Take 1 tablet by mouth  daily before breakfast 90 tablet 1  . potassium chloride SA (K-DUR,KLOR-CON) 20 MEQ tablet Take 1 tablet (20 mEq total) by mouth daily as needed (with each torsemide dose). 10 tablet 10  . predniSONE (DELTASONE) 50 MG tablet One tab PO daily for 5 days. 5 tablet 0  . ramipril (ALTACE) 2.5 MG capsule Take 1 capsule (2.5 mg total) by mouth daily. 90 capsule 3  . simvastatin (ZOCOR) 10 MG tablet Take 1 tablet by mouth  every evening 90 tablet 2  . spironolactone (ALDACTONE) 25 MG tablet Take 0.5 tablets (12.5 mg total) by mouth daily. 45 tablet 3  .  torsemide (DEMADEX) 20 MG tablet Take 1 tablet (20 mg total) by mouth daily as needed (weight of 215 lbs or more). 10 tablet 10  . warfarin (COUMADIN) 5 MG tablet Take 1 tablet by mouth once daily at 6PM (Patient taking differently: take 1 1/2 tablet by mouth on Saturday at bedtime and 1 tablet all other days of the week at bedtime) 90 tablet 0   No current facility-administered medications for this visit.    No Known Allergies   Exam:  BP 115/68   Pulse 73  Gen: Well NAD HEENT: EOMI,  MMM Large hematoma left forehead Lungs: Normal work of breathing. CTABL Heart: Slightly irregular heart rate. Decreased heart sounds. Abd: NABS, Soft.  Nondistended, Nontender Exts: Brisk capillary refill, warm and well perfused.    Results for orders placed or performed in visit on 04/02/16 (from the past 24 hour(s))  POCT INR     Status: None   Collection Time: 04/02/16  2:57 PM  Result Value Ref Range   INR 2.7    US Aorta  Result Date: 04/02/2016 CLINICAL DATA:  Abdominal aortic aneurysm noted on lumbar radiographs EXAM: ULTRASOUND OF ABDOMINAL AORTA TECHNIQUE: Ultrasound examination of the abdominal aorta was performed to evaluate for abdominal aortic aneurysm. COMPARISON:  CT 10/15/ 2007 FINDINGS: Abdominal Aorta Fusiform aneurysm in the distal segment, involving a length of approximately 7.1 cm. Proximal margin is obscured by overlying bowel gas. Maximum Diameter: 3.7 x 3.4 cm, distal segment (previously 3.2 x 2.7). The right common iliac artery is ectatic, measures 16 x 17 mm diameter, left 14 x 19 mm. IMPRESSION: 1. 3.7 cm abdominal aortic aneurysm, with contiguous ectatic common iliac arteries. Recommend followup by ultrasound in 2 years. This recommendation follows ACR consensus guidelines: White Paper of the ACR Incidental Findings Committee II on Vascular Findings. J Am Coll Radiol 2013; 10:789-794. Electronically Signed   By: Corlis Leak M.D.   On: 04/02/2016 11:57      Assessment and Plan: 82 y.o. male with fall and forehead hematoma.  Patient suffered a mechanical fall fortunately does not seem to be significantly injured. However I'm concerned about his long-term prognosis. He's had several falls recently. He has multiple medical problems and currently uses warfarin for atrial fibrillation. I'm concerned that he is at greater risk because of the anticoagulation that he benefits from it however I do appreciate cardiology recommendations at this point..   Patient has a follow-up appointment with cardiology here in the near future and will recheck in about 2 weeks to make a group decision about stopping warfarin or not.  otherwise  he may resume his normal warfarin regimen.   As noted above patient had abdominal ultrasound previously scheduled showing aortic aneurysm. He has significant cardiac medical problems that are comanage with cardiology.   Orders Placed This Encounter  Procedures  . POCT INR    Discussed warning signs or symptoms. Please see discharge instructions. Patient expresses understanding.

## 2016-04-02 NOTE — Patient Instructions (Signed)
Thank you for coming in today. Return in a few weeks with your wife to schedule an establish care visit.  Ask your cardiologist about stopping warfarin.  I think this is a good idea.   You should expect a home health assessment soon.    Resume normal warfarin schedule.

## 2016-04-03 ENCOUNTER — Ambulatory Visit (HOSPITAL_COMMUNITY)
Admission: RE | Admit: 2016-04-03 | Discharge: 2016-04-03 | Disposition: A | Discharge status: Home / Self Care | Payer: Medicare Other | Source: Ambulatory Visit | Attending: Cardiovascular Disease | Admitting: Cardiovascular Disease

## 2016-04-03 DIAGNOSIS — I714 Abdominal aortic aneurysm, without rupture, unspecified: Secondary | ICD-10-CM

## 2016-04-03 DIAGNOSIS — I739 Peripheral vascular disease, unspecified: Secondary | ICD-10-CM | POA: Diagnosis not present

## 2016-04-04 ENCOUNTER — Encounter: Payer: Self-pay | Admitting: Nurse Practitioner

## 2016-04-04 ENCOUNTER — Encounter: Payer: Self-pay | Admitting: *Deleted

## 2016-04-04 ENCOUNTER — Ambulatory Visit (INDEPENDENT_AMBULATORY_CARE_PROVIDER_SITE_OTHER): Payer: Medicare Other | Admitting: Nurse Practitioner

## 2016-04-04 VITALS — BP 124/70 | HR 74 | Ht 75.0 in | Wt 214.8 lb

## 2016-04-04 DIAGNOSIS — I471 Supraventricular tachycardia: Secondary | ICD-10-CM | POA: Diagnosis not present

## 2016-04-04 DIAGNOSIS — I4819 Other persistent atrial fibrillation: Secondary | ICD-10-CM

## 2016-04-04 DIAGNOSIS — I10 Essential (primary) hypertension: Secondary | ICD-10-CM | POA: Diagnosis not present

## 2016-04-04 DIAGNOSIS — I5022 Chronic systolic (congestive) heart failure: Secondary | ICD-10-CM

## 2016-04-04 DIAGNOSIS — I481 Persistent atrial fibrillation: Secondary | ICD-10-CM

## 2016-04-04 DIAGNOSIS — G4733 Obstructive sleep apnea (adult) (pediatric): Secondary | ICD-10-CM

## 2016-04-04 LAB — CUP PACEART INCLINIC DEVICE CHECK
Date Time Interrogation Session: 20171011105852
Implantable Lead Implant Date: 20080806
Implantable Lead Location: 753859
Implantable Lead Location: 753860
MDC IDC LEAD IMPLANT DT: 20080806
MDC IDC LEAD IMPLANT DT: 20080806
MDC IDC LEAD LOCATION: 753858

## 2016-04-04 NOTE — Patient Instructions (Addendum)
Medication Instructions:   Your physician recommends that you continue on your current medications as directed. Please refer to the Current Medication list given to you today.     If you need a refill on your cardiac medications before your next appointment, please call your pharmacy.  Labwork: RETURN 3 TO 4 DAYS BEFORE 05/09/16 FOR CBC  BMET AND PTINR    Testing/Procedures: SEE LETTER FOR [facility] ON 05/09/16   Follow-Up: 10  DAY WOUND CHECK AFTER 05/09/2016                       90 DAY PHYS DEFIB CHK AFTER 05/09/2016   Any Other Special Instructions Will Be Listed Below (If Applicable).

## 2016-04-04 NOTE — Progress Notes (Signed)
Electrophysiology Office Note Date: 04/04/2016  ID:  [name] [name], DOB 07-Dec-1931, MRN [id number]  PCP: Clementeen Graham, MD Primary Cardiologist: Mayford Knife Electrophysiologist: Graciela Husbands  CC: Atrial tachycardia and CHF follow up  [name] [name] is a 82 y.o. male seen today for Dr Graciela Husbands.  He presents today for routine electrophysiology followup.  When he was last seen by Dr Graciela Husbands, he had increased heart failure symptoms associated with atrial tachycardia identified on his device. He was started on amiodarone with planned follow up today.  Since last being seen in our clinic, the patient reports doing relatively well.  He had a mechanical fall last week and has a healing laceration over his left eye.  He has not noticed a lot of improvement in shortness of breath and energy level since last visit.  He does have chronic leg pain and is still undergoing work up for that.  His PCP asks about the risks/benefits of Warfarin with recurrent falls.  He denies chest pain, palpitations, PND, orthopnea, nausea, vomiting, dizziness, syncope, edema, weight gain, or early satiety.  He has not had ICD shocks.   Device History: MDT CRTD implanted 2008 for NICM, and CHF, gen change 2012 History of appropriate therapy: No History of AAD therapy: No   Past Medical History:  Diagnosis Date  . Anemia, unspecified 04/07/2013  . Atrial fibrillation (HCC)    fibrillation/flutter  . BPH (benign prostatic hypertrophy)   . Chronic systolic CHF (congestive heart failure) (HCC)    Nonischemic DCM EF 10% s/p BiV AICD  . Coronary artery disease 2008   nonobstructive ASCAD  . Depression   . Hyperlipidemia   . Hypertension   . Hypothyroidism   . Left bundle branch block   . Nonischemic cardiomyopathy (HCC)    ef 10 %  . OSA (obstructive sleep apnea)    intolerant to CPAP  . Osteoarthritis   . Sleep apnea    stopped using CPAP  . Thrombocytopenia (HCC)    Past Surgical History:  Procedure Laterality Date  .  CARDIAC DEFIBRILLATOR PLACEMENT     left chest  . CARDIOVERSION N/A 12/29/2012   Procedure: CARDIOVERSION;  Surgeon: Quintella Reichert, MD;  Location: [facility];  Service: Cardiovascular;  Laterality: N/A;  . CHOLECYSTECTOMY    . Colonscopy     reportedly negative per pt; around 2005.    Marland Kitchen TRANSURETHRAL RESECTION OF PROSTATE     for BPH    Current Outpatient Prescriptions  Medication Sig Dispense Refill  . acetaminophen (TYLENOL) 650 MG CR tablet Take 1 tablet (650 mg total) by mouth every 8 (eight) hours as needed for pain. 90 tablet 3  . amiodarone (PACERONE) 200 MG tablet Take 200 mg by mouth daily.    . carvedilol (COREG) 3.125 MG tablet Take 1 tablet (3.125 mg total) by mouth 2 (two) times daily. 180 tablet 1  . DIGOX 125 MCG tablet Take 1 tablet by mouth  every other day 45 tablet 3  . levothyroxine (SYNTHROID, LEVOTHROID) 125 MCG tablet Take 1 tablet by mouth  daily before breakfast 90 tablet 1  . potassium chloride SA (K-DUR,KLOR-CON) 20 MEQ tablet Take 1 tablet (20 mEq total) by mouth daily as needed (with each torsemide dose). 10 tablet 10  . predniSONE (DELTASONE) 50 MG tablet One tab PO daily for 5 days. 5 tablet 0  . ramipril (ALTACE) 2.5 MG capsule Take 1 capsule (2.5 mg total) by mouth daily. 90 capsule 3  . simvastatin (ZOCOR) 10 MG  tablet Take 1 tablet by mouth  every evening 90 tablet 2  . spironolactone (ALDACTONE) 25 MG tablet Take 0.5 tablets (12.5 mg total) by mouth daily. 45 tablet 3  . torsemide (DEMADEX) 20 MG tablet Take 1 tablet (20 mg total) by mouth daily as needed (weight of 215 lbs or more). 10 tablet 10  . warfarin (COUMADIN) 5 MG tablet Take 1 tablet by mouth once daily at 6PM (Patient taking differently: take 1 1/2 tablet by mouth on Saturday at bedtime and 1 tablet all other days of the week at bedtime) 90 tablet 0   No current facility-administered medications for this visit.     Allergies:   Review of patient's allergies indicates no known allergies.    Social History: Social History   Social History  . Marital status: Married    Spouse name: N/A  . Number of children: 4  . Years of education: N/A   Occupational History  . retired     retired Optometrist; now in [employer], political party  .  Retired   Social History Main Topics  . Smoking status: Never Smoker  . Smokeless tobacco: Never Used  . Alcohol use No  . Drug use: No  . Sexual activity: No   Other Topics Concern  . Not on file   Social History Narrative  . No narrative on file    Family History: Family History  Problem Relation Age of Onset  . Heart failure Mother   . Stroke Mother   . Heart disease Father   . Cancer Maternal Uncle     Unknown subtype  . Heart attack Neg Hx     Review of Systems: All other systems reviewed and are otherwise negative except as noted above.   Physical Exam: VS:  BP 124/70   Pulse 74   Ht 6\' 3"  (1.905 m)   Wt 214 lb 12.8 oz (97.4 kg)   BMI 26.85 kg/m  , BMI Body mass index is 26.85 kg/m.  GEN- The patient is elderly appearing, alert and oriented x 3 today.   HEENT: normocephalic; sclera clear, conjunctiva pink; hearing intact; oropharynx clear; neck supple; +sutured laceration and hematoma over right orbit Lungs- Clear to ausculation bilaterally, normal work of breathing.  No wheezes, rales, rhonchi Heart- Regular rate and rhythm (paced) GI- soft, non-tender, non-distended, bowel sounds present Extremities- no clubbing, cyanosis, or edema MS- no significant deformity or atrophy Skin- warm and dry, no rash or lesion; ICD pocket well healed Psych- euthymic mood, full affect Neuro- strength and sensation are intact  ICD interrogation- reviewed in detail today,  See PACEART report  EKG:  EKG is ordered today. The ekg ordered today shows sinus rhythm with ventricular pacing   Recent Labs: 02/04/2016: ALT 16; B Natriuretic Peptide 390.2; Magnesium 2.1; TSH 1.789 03/22/2016: BUN 19; Creatinine, Ser 1.47;  Hemoglobin 12.9; Platelets 143; Potassium 4.3; Sodium 136   Wt Readings from Last 3 Encounters:  04/04/16 214 lb 12.8 oz (97.4 kg)  03/22/16 210 lb (95.3 kg)  03/19/16 217 lb 12.8 oz (98.8 kg)     Other studies Reviewed: Additional studies/ records that were reviewed today include: Dr Odessa Fleming office notes, echo   Assessment and Plan:  1.  Chronic systolic dysfunction euvolemic today Stable on an appropriate medical regimen Normal ICD function See Pace Art report No changes today His device is at ERI. Risks, benefits to generator change discussed with patient today who wishes to proceed. We discussed fact that he  has not had appropriate therapy with either device and change out to CRTP would be very reasonable given advanced age. He and his family have discussed and are clear that they would like to continue with CRTD. Will schedule with Dr Graciela HusbandsKlein at the next available time.   2.  Atrial tachycardia/atrial fibrillation Burden by device interrogation 0% since amiodarone dose increased  V rates controlled Continue Warfarin for CHADS2VASC of 4 Continue amiodarone.  TSH and LFT's 01/2016 were normal. Annual eye exams recommended. No cough or change in breathing status PCP has recommended discontinuation of Warfarin with falls and injury.  I will send note to Dr Graciela HusbandsKlein to review as he knows the patient well.   3.  HTN Stable No change required today  4.  OSA Previously intolerant of CPAP    Current medicines are reviewed at length with the patient today.   The patient does not have concerns regarding his medicines.  The following changes were made today:  none  Labs/ tests ordered today include:  Orders Placed This Encounter  Procedures  . CBC  . Basic metabolic panel  . Protime-INR  . Implantable device check     Disposition:   Follow up with Dr Graciela HusbandsKlein after generator change    Signed, Gypsy BalsamAmber Norma Ignasiak, NP 04/04/2016 11:01 AM  [facility] [address] [place] [address] [phone] (office) [phone] (fax)

## 2016-04-05 ENCOUNTER — Encounter: Payer: Self-pay | Admitting: Nurse Practitioner

## 2016-04-05 DIAGNOSIS — Z9181 History of falling: Secondary | ICD-10-CM | POA: Diagnosis not present

## 2016-04-05 DIAGNOSIS — M48061 Spinal stenosis, lumbar region without neurogenic claudication: Secondary | ICD-10-CM | POA: Diagnosis not present

## 2016-04-05 DIAGNOSIS — R2681 Unsteadiness on feet: Secondary | ICD-10-CM | POA: Diagnosis not present

## 2016-04-06 ENCOUNTER — Telehealth: Payer: Self-pay

## 2016-04-06 DIAGNOSIS — R2681 Unsteadiness on feet: Secondary | ICD-10-CM

## 2016-04-06 DIAGNOSIS — I5022 Chronic systolic (congestive) heart failure: Secondary | ICD-10-CM

## 2016-04-06 MED ORDER — AMBULATORY NON FORMULARY MEDICATION: 0 refills | Status: DC

## 2016-04-06 NOTE — Telephone Encounter (Signed)
Shana from [facility] left a detailed vm stating that the pt is scheduled for a procedure on 05/09/2016 and requires labs in the preceding days. For pt convenience she questioned if the lab could be taken at our location as the labs have already been ordered. Attempted to return call and was unable to reach anyone due to inability to wait out hold time. Attempted to release orders, Lab Req did not contain bar code. Reordered labs under PCP and faxed order to [facility]. Order will be good for 6 months.

## 2016-04-06 NOTE — Telephone Encounter (Signed)
-

## 2016-04-06 NOTE — Telephone Encounter (Signed)
Order faxed.

## 2016-04-06 NOTE — Telephone Encounter (Signed)
Riza with [facility] called stating that pt is very unstable with walking and recommends a front wheel Bruski. She requests an order for a front wheel Noecker be faxed to [facility]. Order should be faxed along with pts weight, height, demographics, and insurance information. Please write order when appropriate and I will attach it to the rest of the required information and fax it to [phone].

## 2016-04-09 DIAGNOSIS — G4733 Obstructive sleep apnea (adult) (pediatric): Secondary | ICD-10-CM | POA: Diagnosis not present

## 2016-04-09 DIAGNOSIS — G4731 Primary central sleep apnea: Secondary | ICD-10-CM | POA: Diagnosis not present

## 2016-04-09 DIAGNOSIS — I509 Heart failure, unspecified: Secondary | ICD-10-CM | POA: Diagnosis not present

## 2016-04-09 DIAGNOSIS — R269 Unspecified abnormalities of gait and mobility: Secondary | ICD-10-CM | POA: Diagnosis not present

## 2016-04-10 ENCOUNTER — Telehealth: Payer: Self-pay

## 2016-04-10 DIAGNOSIS — M48061 Spinal stenosis, lumbar region without neurogenic claudication: Secondary | ICD-10-CM | POA: Diagnosis not present

## 2016-04-10 DIAGNOSIS — Z9181 History of falling: Secondary | ICD-10-CM | POA: Diagnosis not present

## 2016-04-10 DIAGNOSIS — R2681 Unsteadiness on feet: Secondary | ICD-10-CM | POA: Diagnosis not present

## 2016-04-10 NOTE — Telephone Encounter (Signed)
Pt states he's done with all the tests you ordered and would like to know what his next step is. Please advise.

## 2016-04-10 NOTE — Telephone Encounter (Signed)
If this is regarding his balance we could certainly try home vestibular and balance rehabilitation.

## 2016-04-11 DIAGNOSIS — Z9181 History of falling: Secondary | ICD-10-CM | POA: Diagnosis not present

## 2016-04-11 DIAGNOSIS — M48061 Spinal stenosis, lumbar region without neurogenic claudication: Secondary | ICD-10-CM | POA: Diagnosis not present

## 2016-04-11 DIAGNOSIS — R2681 Unsteadiness on feet: Secondary | ICD-10-CM | POA: Diagnosis not present

## 2016-04-11 NOTE — Telephone Encounter (Signed)
Wife notified.

## 2016-04-12 ENCOUNTER — Encounter: Payer: Self-pay | Admitting: Podiatry

## 2016-04-12 ENCOUNTER — Ambulatory Visit (INDEPENDENT_AMBULATORY_CARE_PROVIDER_SITE_OTHER): Payer: Medicare Other | Admitting: Podiatry

## 2016-04-12 VITALS — BP 150/79 | HR 72

## 2016-04-12 DIAGNOSIS — M79671 Pain in right foot: Secondary | ICD-10-CM

## 2016-04-12 DIAGNOSIS — M79673 Pain in unspecified foot: Secondary | ICD-10-CM | POA: Diagnosis not present

## 2016-04-12 DIAGNOSIS — B351 Tinea unguium: Secondary | ICD-10-CM

## 2016-04-12 DIAGNOSIS — M79672 Pain in left foot: Secondary | ICD-10-CM

## 2016-04-12 NOTE — Progress Notes (Signed)
SUBJECTIVE: 82 y.o. year old male presents accompanied by his wife complaining of thick and sharp nails cutting through neighboring toes and making them hurt. Been having bad nails on big toes for many years. He is no longer able to handle them.  Taking Coumadin. Recently had a fall and has blue and blackish skin surrounding left eye socket.   REVIEW OF SYSTEMS: Pertinent items noted in HPI and remainder of comprehensive ROS otherwise negative. Denies any other acute medical issues at this time.   OBJECTIVE: DERMATOLOGIC EXAMINATION: Grossly deformed and hypertrophic hallucal nails bilateral, symptomatic. Hypertrophic nails x 10. No open skin lesions bilateral.   VASCULAR EXAMINATION OF LOWER LIMBS: All pedal pulses are palpable with normal pulsation.  Capillary Filling times within 3 seconds in all digits.  No edema or erythema in lower limbs. Temperature gradient from tibial crest to dorsum of foot is within normal bilateral.  NEUROLOGIC EXAMINATION OF THE LOWER LIMBS: Achilles DTR is present and within normal. Patient reports subjective numbness at times on feet for about a year.  Decreased sensory perception on Monofilament (Semmes-Weinstein 10-gm) sensory testing 50% responsive bilateral. Vibratory sensations(128Hz  turning fork) intact at medial and lateral forefoot bilateral.  Sharp and Dull discriminatory sensations at the plantar ball of hallux is intact bilateral.   MUSCULOSKELETAL EXAMINATION: No gross deformities in osseous structures bilateral.   ASSESSMENT: Onychomycosis x 10. Painful nails.  PLAN: Reviewed findings and available treatment options. All nails debrided. Return in 3 month or as needed.

## 2016-04-12 NOTE — Patient Instructions (Signed)
Seen for hypertrophic nails. All nails debrided. Return in 3 months or as needed.  

## 2016-04-13 DIAGNOSIS — R2681 Unsteadiness on feet: Secondary | ICD-10-CM | POA: Diagnosis not present

## 2016-04-13 DIAGNOSIS — M48061 Spinal stenosis, lumbar region without neurogenic claudication: Secondary | ICD-10-CM | POA: Diagnosis not present

## 2016-04-13 DIAGNOSIS — Z9181 History of falling: Secondary | ICD-10-CM | POA: Diagnosis not present

## 2016-04-16 ENCOUNTER — Encounter: Payer: Self-pay | Admitting: Family Medicine

## 2016-04-16 ENCOUNTER — Ambulatory Visit (INDEPENDENT_AMBULATORY_CARE_PROVIDER_SITE_OTHER): Payer: Medicare Other | Admitting: Family Medicine

## 2016-04-16 ENCOUNTER — Other Ambulatory Visit: Payer: Self-pay | Admitting: *Deleted

## 2016-04-16 VITALS — BP 124/63 | HR 72 | Wt 217.0 lb

## 2016-04-16 DIAGNOSIS — I481 Persistent atrial fibrillation: Secondary | ICD-10-CM

## 2016-04-16 DIAGNOSIS — S0083XD Contusion of other part of head, subsequent encounter: Secondary | ICD-10-CM

## 2016-04-16 DIAGNOSIS — N401 Enlarged prostate with lower urinary tract symptoms: Secondary | ICD-10-CM

## 2016-04-16 DIAGNOSIS — R350 Frequency of micturition: Secondary | ICD-10-CM

## 2016-04-16 DIAGNOSIS — I4819 Other persistent atrial fibrillation: Secondary | ICD-10-CM

## 2016-04-16 DIAGNOSIS — M48061 Spinal stenosis, lumbar region without neurogenic claudication: Secondary | ICD-10-CM | POA: Diagnosis not present

## 2016-04-16 DIAGNOSIS — Z9181 History of falling: Secondary | ICD-10-CM | POA: Diagnosis not present

## 2016-04-16 DIAGNOSIS — R2681 Unsteadiness on feet: Secondary | ICD-10-CM | POA: Diagnosis not present

## 2016-04-16 MED ORDER — TAMSULOSIN HCL 0.4 MG PO CAPS: 0.4000 mg | ORAL_CAPSULE | Freq: Every day | ORAL | 3 refills | Status: DC

## 2016-04-16 MED ORDER — WARFARIN SODIUM 5 MG PO TABS: ORAL_TABLET | ORAL | 0 refills | Status: DC

## 2016-04-16 NOTE — Patient Instructions (Signed)
Thank you for coming in today. Take flomax daily.  Return in 1 month.  Let me know a good day for a family meeting.  I can use my time off on Friday afternoons to do this.

## 2016-04-16 NOTE — Progress Notes (Signed)
[name] [name] is a 82 y.o. male who presents to [name]: [facility] today for follow up facial laceration and forehead contusion. [name] [name] was seen about 2 weeks ago for hospital follow up after suffering a fall that resulted in a forehead laceration and contusion. In the interval he has done well. He feels as though he is recovering. He continues to receive home PT.   He also notes that he is scheduled for a defibrillator/pacer change in the near future per his cardiologist.   He complains today of some urinary symptoms for a few months. He has trouble starting and stopping urination. He gets up at night to urinate 3x most nights. He notes urinary urgency as well. He has a history of BPH s/p TURP.  He is not currently taking any medications for BPH.    Past Medical History:  Diagnosis Date  . Anemia, unspecified 04/07/2013  . Atrial fibrillation (HCC)    fibrillation/flutter  . BPH (benign prostatic hypertrophy)   . Chronic systolic CHF (congestive heart failure) (HCC)    Nonischemic DCM EF 10% s/p BiV AICD  . Coronary artery disease 2008   nonobstructive ASCAD  . Depression   . Hyperlipidemia   . Hypertension   . Hypothyroidism   . Left bundle branch block   . Nonischemic cardiomyopathy (HCC)    ef 10 %  . OSA (obstructive sleep apnea)    intolerant to CPAP  . Osteoarthritis   . Sleep apnea    stopped using CPAP  . Thrombocytopenia (HCC)    Past Surgical History:  Procedure Laterality Date  . CARDIAC DEFIBRILLATOR PLACEMENT     left chest  . CARDIOVERSION N/A 12/29/2012   Procedure: CARDIOVERSION;  Surgeon: Quintella Reichert, MD;  Location: [facility];  Service: Cardiovascular;  Laterality: N/A;  . CHOLECYSTECTOMY    . Colonscopy     reportedly negative per pt; around 2005.    Marland Kitchen TRANSURETHRAL RESECTION OF PROSTATE     for BPH   Social History  Substance  Use Topics  . Smoking status: Never Smoker  . Smokeless tobacco: Never Used  . Alcohol use No   family history includes Cancer in his maternal uncle; Heart disease in his father; Heart failure in his mother; Stroke in his mother.  ROS as above:  Medications: Current Outpatient Prescriptions  Medication Sig Dispense Refill  . acetaminophen (TYLENOL) 650 MG CR tablet Take 1 tablet (650 mg total) by mouth every 8 (eight) hours as needed for pain. 90 tablet 3  . AMBULATORY NON FORMULARY MEDICATION Front wheel rolling Lachman for use as needed due to gait instability. Dispense 1   Fax to [phone]  R26.81 1 each 0  . amiodarone (PACERONE) 200 MG tablet Take 200 mg by mouth daily.    . carvedilol (COREG) 3.125 MG tablet Take 1 tablet (3.125 mg total) by mouth 2 (two) times daily. 180 tablet 1  . DIGOX 125 MCG tablet Take 1 tablet by mouth  every other day 45 tablet 3  . levothyroxine (SYNTHROID, LEVOTHROID) 125 MCG tablet Take 1 tablet by mouth  daily before breakfast 90 tablet 1  . potassium chloride SA (K-DUR,KLOR-CON) 20 MEQ tablet Take 1 tablet (20 mEq total) by mouth daily as needed (with each torsemide dose). 10 tablet 10  . predniSONE (DELTASONE) 50 MG tablet One tab PO daily for 5 days. 5 tablet 0  . ramipril (ALTACE) 2.5 MG capsule Take  1 capsule (2.5 mg total) by mouth daily. 90 capsule 3  . simvastatin (ZOCOR) 10 MG tablet Take 1 tablet by mouth  every evening 90 tablet 2  . spironolactone (ALDACTONE) 25 MG tablet Take 0.5 tablets (12.5 mg total) by mouth daily. 45 tablet 3  . torsemide (DEMADEX) 20 MG tablet Take 1 tablet (20 mg total) by mouth daily as needed (weight of 215 lbs or more). 10 tablet 10  . warfarin (COUMADIN) 5 MG tablet Take 1 tablet by mouth once daily at 6PM 90 tablet 0  . tamsulosin (FLOMAX) 0.4 MG CAPS capsule Take 1 capsule (0.4 mg total) by mouth daily. 30 capsule 3   No current facility-administered medications for this visit.    No Known  Allergies  Health Maintenance Health Maintenance  Topic Date Due  . ZOSTAVAX  11/28/2016 (Originally 05/28/1994)  . PNA vac Low Risk Adult (1 of 2 - PCV13) 11/28/2016 (Originally 05/29/1999)  . TETANUS/TDAP  07/01/2019  . INFLUENZA VACCINE  Completed     Exam:  BP 124/63   Pulse 72   Wt 217 lb (98.4 kg)   SpO2 97%   BMI 27.12 kg/m  Gen: Well NAD HEENT: EOMI,  MMM improved ecchymosis. Intact absorbable sutures left eyebrow. Wound well appearing with no erythremia or discharge.  Abd: NABS, Soft. Nondistended, Nontender Exts: Brisk capillary refill Gait: Uses Lunt for ambulation.    Sutures removed from left eyebrow   No results found for this or any previous visit (from the past 72 hour(s)). No results found.    Assessment and Plan: 82 y.o. male with  1) Forehead laceration/contusion: Doing well. Sutures removed.   2) Anti coag: Discussion with cardiology, patient and family decision was made to continue warfarin as the benefit of this medicine is thought to be more than the risk.    3) BPH: Likely cause of symptoms. Rectal exam declined. Plan to try flomax. Will recheck in 1 month.   4) Goals of care: Given information for advanced directive. Will also plan for a family meeting in the near future. Patient is elderly, frail and has multiple medical problems. He is currently living at home but I fear for his independence should he or his wife worsen in the future.    No orders of the defined types were placed in this encounter.   Discussed warning signs or symptoms. Please see discharge instructions. Patient expresses understanding.

## 2016-04-17 ENCOUNTER — Telehealth: Payer: Self-pay | Admitting: Nurse Practitioner

## 2016-04-17 NOTE — Telephone Encounter (Signed)
New Message  Pt call requesting to speak with RN about lab work he is to have completed in [place]. Please call back to discuss

## 2016-04-17 NOTE — Telephone Encounter (Signed)
SPOKE TO PT ABOUT TIME FRAME AND INTRUCTIONS TO TAKE LAB REQUISITIONS TO LAB  ON THE 11/9 OR 11/10  FOR PRE PROCEDURAL LABS FOR 11/15

## 2016-04-19 DIAGNOSIS — Z9181 History of falling: Secondary | ICD-10-CM | POA: Diagnosis not present

## 2016-04-19 DIAGNOSIS — M48061 Spinal stenosis, lumbar region without neurogenic claudication: Secondary | ICD-10-CM | POA: Diagnosis not present

## 2016-04-19 DIAGNOSIS — R2681 Unsteadiness on feet: Secondary | ICD-10-CM | POA: Diagnosis not present

## 2016-04-20 ENCOUNTER — Encounter: Payer: Self-pay | Admitting: *Deleted

## 2016-04-20 ENCOUNTER — Ambulatory Visit (INDEPENDENT_AMBULATORY_CARE_PROVIDER_SITE_OTHER): Payer: Medicare Other | Admitting: Sports Medicine

## 2016-04-20 ENCOUNTER — Encounter: Payer: Self-pay | Admitting: Sports Medicine

## 2016-04-20 ENCOUNTER — Ambulatory Visit (INDEPENDENT_AMBULATORY_CARE_PROVIDER_SITE_OTHER): Payer: Medicare Other

## 2016-04-20 VITALS — BP 120/75 | HR 73 | Resp 18 | Wt 217.0 lb

## 2016-04-20 DIAGNOSIS — E039 Hypothyroidism, unspecified: Secondary | ICD-10-CM | POA: Diagnosis not present

## 2016-04-20 DIAGNOSIS — M5127 Other intervertebral disc displacement, lumbosacral region: Secondary | ICD-10-CM

## 2016-04-20 DIAGNOSIS — M5126 Other intervertebral disc displacement, lumbar region: Secondary | ICD-10-CM

## 2016-04-20 DIAGNOSIS — D508 Other iron deficiency anemias: Secondary | ICD-10-CM | POA: Diagnosis not present

## 2016-04-20 DIAGNOSIS — R296 Repeated falls: Secondary | ICD-10-CM | POA: Diagnosis not present

## 2016-04-20 DIAGNOSIS — M48061 Spinal stenosis, lumbar region without neurogenic claudication: Secondary | ICD-10-CM

## 2016-04-20 DIAGNOSIS — R2681 Unsteadiness on feet: Secondary | ICD-10-CM

## 2016-04-20 DIAGNOSIS — R946 Abnormal results of thyroid function studies: Secondary | ICD-10-CM | POA: Diagnosis not present

## 2016-04-20 DIAGNOSIS — Z1321 Encounter for screening for nutritional disorder: Secondary | ICD-10-CM | POA: Diagnosis not present

## 2016-04-20 DIAGNOSIS — I5022 Chronic systolic (congestive) heart failure: Secondary | ICD-10-CM | POA: Diagnosis not present

## 2016-04-20 DIAGNOSIS — A530 Latent syphilis, unspecified as early or late: Secondary | ICD-10-CM

## 2016-04-20 LAB — CBC
HCT: 38.6 % (ref 38.5–50.0)
Hemoglobin: 12.9 g/dL — ABNORMAL LOW (ref 13.2–17.1)
MCH: 30.9 pg (ref 27.0–33.0)
MCHC: 33.4 g/dL (ref 32.0–36.0)
MCV: 92.3 fL (ref 80.0–100.0)
MPV: 10.9 fL (ref 7.5–12.5)
Platelets: 144 K/uL (ref 140–400)
RBC: 4.18 MIL/uL — ABNORMAL LOW (ref 4.20–5.80)
RDW: 14.4 % (ref 11.0–15.0)
WBC: 5.2 K/uL (ref 3.8–10.8)

## 2016-04-20 LAB — T3, FREE: T3, Free: 2 pg/mL — ABNORMAL LOW (ref 2.3–4.2)

## 2016-04-20 LAB — COMPREHENSIVE METABOLIC PANEL
Albumin: 4.2 g/dL (ref 3.6–5.1)
Calcium: 9.2 mg/dL (ref 8.6–10.3)
Chloride: 101 mmol/L (ref 98–110)
Creat: 1.44 mg/dL — ABNORMAL HIGH (ref 0.70–1.11)
Potassium: 4.3 mmol/L (ref 3.5–5.3)
Sodium: 138 mmol/L (ref 135–146)
Total Protein: 6.6 g/dL (ref 6.1–8.1)

## 2016-04-20 LAB — COMPREHENSIVE METABOLIC PANEL WITH GFR
ALT: 12 U/L (ref 9–46)
AST: 18 U/L (ref 10–35)
Alkaline Phosphatase: 95 U/L (ref 40–115)
BUN: 18 mg/dL (ref 7–25)
CO2: 26 mmol/L (ref 20–31)
Glucose, Bld: 127 mg/dL — ABNORMAL HIGH (ref 65–99)
Total Bilirubin: 0.9 mg/dL (ref 0.2–1.2)

## 2016-04-20 LAB — T4, FREE: Free T4: 1.2 ng/dL (ref 0.8–1.8)

## 2016-04-20 LAB — TSH: TSH: 5.89 mIU/L — ABNORMAL HIGH (ref 0.40–4.50)

## 2016-04-20 LAB — VITAMIN B12: Vitamin B-12: >2000 pg/mL — ABNORMAL HIGH (ref 200–1100)

## 2016-04-20 MED ORDER — CYANOCOBALAMIN 1000 MCG/ML IJ SOLN
1000.0000 ug | Freq: Once | INTRAMUSCULAR | Status: AC
  Administered 2016-04-20: 1000 ug via INTRAMUSCULAR

## 2016-04-20 NOTE — Addendum Note (Signed)
Addended by: Baird Kay on: 04/20/2016 11:27 AM   Modules accepted: Orders

## 2016-04-20 NOTE — Assessment & Plan Note (Signed)
Moderate to severe at L4-L5. This is a likely contributor to his poor balance. We are going to add a left L4-L5 interlaminar epidural.

## 2016-04-20 NOTE — Assessment & Plan Note (Addendum)
Vitamin B12 levels were in the low limit of normal. Checking homocystine and methylmalonic acid levels. We will start B12 injections every month. Also in the differential is lumbar spinal stenosis. He has undergone physical therapy and balance and gait training. Checking some other routine blood work. Return in one month to PCP. We did discuss assisted living, he is not yet ready to consider this.  Multiple abnormalities including that can contribute to balance problems: 1. low thyroid, increasing levothyroxine to 137 mcg 2. positive RPR (needs confirmatory testing which is already reflexed),  3. high homocysteine which indicates low B12 (we gave initial B12 shot before labs so serum values of B12 are high), still awaiting methylmalonic acid levels but continue monthly B12 shots. 4.   low vitamin D which will be repleted. 5. Lumbar spinal stenosis.  We will correct each abnormality and watch for improvement.

## 2016-04-20 NOTE — Addendum Note (Signed)
Addended by: Monica Becton on: 04/20/2016 03:12 PM   Modules accepted: Orders

## 2016-04-20 NOTE — Progress Notes (Signed)
  Subjective:    CC: Continued unsteadiness  HPI: This is a pleasant 82 year old male, he has been complaining of unsteadiness of gait for some time, resulting in falls, he does have a 4 point Gramling that he uses. We have done vestibular and balance rehabilitation, unfortunately he continues to have severe unsteadiness. He does have knee osteoarthritis that not hurting anymore after injections. B12 levels were in the low level of normal at a previous check. He does also complain of fatigue but principal symptom is unsteadiness of gait.  Past medical history:  Negative.  See flowsheet/record as well for more information.  Surgical history: Negative.  See flowsheet/record as well for more information.  Family history: Negative.  See flowsheet/record as well for more information.  Social history: Negative.  See flowsheet/record as well for more information.  Allergies, and medications have been entered into the medical record, reviewed, and no changes needed.   Review of Systems: No fevers, chills, night sweats, weight loss, chest pain, or shortness of breath.   Objective:    General: Well Developed, well nourished, and in no acute distress.  Neuro: Alert and oriented x3, extra-ocular muscles intact, sensation grossly intact.  HEENT: Normocephalic, atraumatic, pupils equal round reactive to light, neck supple, no masses, no lymphadenopathy, thyroid nonpalpable.  Skin: Warm and dry, no rashes. Cardiac: Regular rate and rhythm, no murmurs rubs or gallops, no lower extremity edema.  Respiratory: Clear to auscultation bilaterally. Not using accessory muscles, speaking in full sentences.  Impression and Recommendations:    Gait instability Vitamin B12 levels were in the low limit of normal. Checking homocystine and methylmalonic acid levels. We will start B12 injections every month. Also in the differential is lumbar spinal stenosis. He has undergone physical therapy and balance and gait  training. Checking some other routine blood work. Return in one month to PCP. We did discuss assisted living, he is not yet ready to consider this.  I spent 25 minutes with this patient, greater than 50% was face-to-face time counseling regarding the above diagnoses

## 2016-04-21 DIAGNOSIS — A530 Latent syphilis, unspecified as early or late: Secondary | ICD-10-CM | POA: Insufficient documentation

## 2016-04-21 LAB — BASIC METABOLIC PANEL

## 2016-04-21 LAB — RPR: RPR Ser Ql: REACTIVE — AB

## 2016-04-21 LAB — RPR TITER: RPR Titer: 1:1 {titer}

## 2016-04-21 LAB — HOMOCYSTEINE: Homocysteine: 17.5 umol/L — ABNORMAL HIGH (ref <11.4)

## 2016-04-21 LAB — PROTIME-INR
INR: 1.6 — ABNORMAL HIGH
Prothrombin Time: 16.7 s — ABNORMAL HIGH (ref 9.0–11.5)

## 2016-04-21 LAB — VITAMIN D 25 HYDROXY (VIT D DEFICIENCY, FRACTURES): Vit D, 25-Hydroxy: 21 ng/mL — ABNORMAL LOW (ref 30–100)

## 2016-04-21 MED ORDER — LEVOTHYROXINE SODIUM 137 MCG PO TABS: 137.0000 ug | ORAL_TABLET | Freq: Every day | ORAL | 3 refills | Status: DC

## 2016-04-21 MED ORDER — VITAMIN D (ERGOCALCIFEROL) 1.25 MG (50000 UNIT) PO CAPS: 50000.0000 [IU] | ORAL_CAPSULE | ORAL | 0 refills | Status: DC

## 2016-04-21 NOTE — Assessment & Plan Note (Signed)
low thyroid, increasing levothyroxine to 137 mcg recheck in 6 weeks

## 2016-04-21 NOTE — Addendum Note (Signed)
Addended by: Monica Becton on: 04/21/2016 05:12 PM   Modules accepted: Orders

## 2016-04-23 DIAGNOSIS — M48061 Spinal stenosis, lumbar region without neurogenic claudication: Secondary | ICD-10-CM | POA: Diagnosis not present

## 2016-04-23 DIAGNOSIS — R2681 Unsteadiness on feet: Secondary | ICD-10-CM | POA: Diagnosis not present

## 2016-04-23 DIAGNOSIS — Z9181 History of falling: Secondary | ICD-10-CM | POA: Diagnosis not present

## 2016-04-23 LAB — TESTOSTERONE TOTAL,FREE,BIO, MALES
Albumin: 4.2 g/dL (ref 3.6–5.1)
Sex Hormone Binding: 45 nmol/L (ref 22–77)
Testosterone, Bioavailable: 58.7 ng/dL (ref 15.0–150.0)
Testosterone, Free: 30.5 pg/mL (ref 6.0–73.0)
Testosterone: 307 ng/dL (ref 250–827)

## 2016-04-23 LAB — METHYLMALONIC ACID, SERUM: Methylmalonic Acid, Quant: 204 nmol/L (ref 87–318)

## 2016-04-23 LAB — FLUORESCENT TREPONEMAL AB(FTA)-IGG-BLD: Fluorescent Treponemal ABS: NONREACTIVE

## 2016-04-25 DIAGNOSIS — R2681 Unsteadiness on feet: Secondary | ICD-10-CM | POA: Diagnosis not present

## 2016-04-25 DIAGNOSIS — Z9181 History of falling: Secondary | ICD-10-CM | POA: Diagnosis not present

## 2016-04-25 DIAGNOSIS — M48061 Spinal stenosis, lumbar region without neurogenic claudication: Secondary | ICD-10-CM | POA: Diagnosis not present

## 2016-04-26 DIAGNOSIS — H02403 Unspecified ptosis of bilateral eyelids: Secondary | ICD-10-CM | POA: Diagnosis not present

## 2016-04-26 DIAGNOSIS — H52223 Regular astigmatism, bilateral: Secondary | ICD-10-CM | POA: Diagnosis not present

## 2016-04-26 DIAGNOSIS — H2511 Age-related nuclear cataract, right eye: Secondary | ICD-10-CM | POA: Diagnosis not present

## 2016-04-26 DIAGNOSIS — H524 Presbyopia: Secondary | ICD-10-CM | POA: Diagnosis not present

## 2016-04-26 DIAGNOSIS — H5201 Hypermetropia, right eye: Secondary | ICD-10-CM | POA: Diagnosis not present

## 2016-04-27 ENCOUNTER — Telehealth: Payer: Self-pay

## 2016-04-27 NOTE — Telephone Encounter (Signed)
Recommendations left on vm 

## 2016-04-27 NOTE — Telephone Encounter (Signed)
It can go up and down. We should recheck it in 2-3 weeks or so.

## 2016-04-27 NOTE — Telephone Encounter (Signed)
Pt is questioning his INR results from 04/20/16.  He don't believe it should have been that low. Please advise.

## 2016-05-02 DIAGNOSIS — Z9181 History of falling: Secondary | ICD-10-CM | POA: Diagnosis not present

## 2016-05-02 DIAGNOSIS — M48061 Spinal stenosis, lumbar region without neurogenic claudication: Secondary | ICD-10-CM | POA: Diagnosis not present

## 2016-05-02 DIAGNOSIS — R2681 Unsteadiness on feet: Secondary | ICD-10-CM | POA: Diagnosis not present

## 2016-05-03 ENCOUNTER — Other Ambulatory Visit: Payer: Self-pay | Admitting: Family Medicine

## 2016-05-03 ENCOUNTER — Encounter: Payer: Self-pay | Admitting: *Deleted

## 2016-05-03 DIAGNOSIS — I5022 Chronic systolic (congestive) heart failure: Secondary | ICD-10-CM | POA: Diagnosis not present

## 2016-05-03 LAB — CBC
HEMATOCRIT: 39.8 % (ref 38.5–50.0)
Hemoglobin: 13.2 g/dL (ref 13.2–17.1)
MCH: 30.8 pg (ref 27.0–33.0)
MCHC: 33.2 g/dL (ref 32.0–36.0)
MCV: 93 fL (ref 80.0–100.0)
MPV: 10.4 fL (ref 7.5–12.5)
Platelets: 171 10*3/uL (ref 140–400)
RBC: 4.28 MIL/uL (ref 4.20–5.80)
RDW: 14.4 % (ref 11.0–15.0)
WBC: 6.4 10*3/uL (ref 3.8–10.8)

## 2016-05-03 LAB — BASIC METABOLIC PANEL
BUN: 28 mg/dL — ABNORMAL HIGH (ref 7–25)
CHLORIDE: 100 mmol/L (ref 98–110)
CO2: 27 mmol/L (ref 20–31)
Calcium: 8.8 mg/dL (ref 8.6–10.3)
Creat: 1.67 mg/dL — ABNORMAL HIGH (ref 0.70–1.11)
GLUCOSE: 140 mg/dL — AB (ref 65–99)
POTASSIUM: 4.6 mmol/L (ref 3.5–5.3)
SODIUM: 136 mmol/L (ref 135–146)

## 2016-05-03 LAB — PROTIME-INR
INR: 1.8 — ABNORMAL HIGH
Prothrombin Time: 19.1 s — ABNORMAL HIGH (ref 9.0–11.5)

## 2016-05-04 ENCOUNTER — Other Ambulatory Visit: Payer: Medicare Other

## 2016-05-04 DIAGNOSIS — R2681 Unsteadiness on feet: Secondary | ICD-10-CM | POA: Diagnosis not present

## 2016-05-04 DIAGNOSIS — M48061 Spinal stenosis, lumbar region without neurogenic claudication: Secondary | ICD-10-CM | POA: Diagnosis not present

## 2016-05-04 DIAGNOSIS — Z9181 History of falling: Secondary | ICD-10-CM | POA: Diagnosis not present

## 2016-05-07 DIAGNOSIS — R2681 Unsteadiness on feet: Secondary | ICD-10-CM | POA: Diagnosis not present

## 2016-05-07 DIAGNOSIS — Z9181 History of falling: Secondary | ICD-10-CM | POA: Diagnosis not present

## 2016-05-07 DIAGNOSIS — M48061 Spinal stenosis, lumbar region without neurogenic claudication: Secondary | ICD-10-CM | POA: Diagnosis not present

## 2016-05-08 MED ORDER — CEFAZOLIN SODIUM-DEXTROSE 2-4 GM/100ML-% IV SOLN
2.0000 g | INTRAVENOUS | Status: AC
  Administered 2016-05-09: 2 g via INTRAVENOUS

## 2016-05-08 MED ORDER — SODIUM CHLORIDE 0.9 % IR SOLN
80.0000 mg | Status: AC
  Administered 2016-05-09: 80 mg

## 2016-05-09 ENCOUNTER — Encounter (HOSPITAL_COMMUNITY)
Admission: RE | Disposition: A | Discharge status: Home / Self Care | Payer: Self-pay | Source: Ambulatory Visit | Attending: Internal Medicine

## 2016-05-09 ENCOUNTER — Ambulatory Visit (HOSPITAL_COMMUNITY)
Admission: RE | Admit: 2016-05-09 | Discharge: 2016-05-09 | Disposition: A | Discharge status: Home / Self Care | Payer: Medicare Other | Source: Ambulatory Visit | Attending: Internal Medicine | Admitting: Internal Medicine

## 2016-05-09 DIAGNOSIS — I2510 Atherosclerotic heart disease of native coronary artery without angina pectoris: Secondary | ICD-10-CM | POA: Insufficient documentation

## 2016-05-09 DIAGNOSIS — I4891 Unspecified atrial fibrillation: Secondary | ICD-10-CM | POA: Diagnosis not present

## 2016-05-09 DIAGNOSIS — I442 Atrioventricular block, complete: Secondary | ICD-10-CM | POA: Diagnosis not present

## 2016-05-09 DIAGNOSIS — I447 Left bundle-branch block, unspecified: Secondary | ICD-10-CM | POA: Diagnosis not present

## 2016-05-09 DIAGNOSIS — I5022 Chronic systolic (congestive) heart failure: Secondary | ICD-10-CM | POA: Diagnosis not present

## 2016-05-09 DIAGNOSIS — Z79899 Other long term (current) drug therapy: Secondary | ICD-10-CM | POA: Insufficient documentation

## 2016-05-09 DIAGNOSIS — N400 Enlarged prostate without lower urinary tract symptoms: Secondary | ICD-10-CM | POA: Diagnosis not present

## 2016-05-09 DIAGNOSIS — E039 Hypothyroidism, unspecified: Secondary | ICD-10-CM | POA: Diagnosis not present

## 2016-05-09 DIAGNOSIS — I471 Supraventricular tachycardia: Secondary | ICD-10-CM | POA: Diagnosis not present

## 2016-05-09 DIAGNOSIS — G4730 Sleep apnea, unspecified: Secondary | ICD-10-CM | POA: Diagnosis not present

## 2016-05-09 DIAGNOSIS — E785 Hyperlipidemia, unspecified: Secondary | ICD-10-CM | POA: Diagnosis not present

## 2016-05-09 DIAGNOSIS — I110 Hypertensive heart disease with heart failure: Secondary | ICD-10-CM | POA: Insufficient documentation

## 2016-05-09 DIAGNOSIS — M1990 Unspecified osteoarthritis, unspecified site: Secondary | ICD-10-CM | POA: Diagnosis not present

## 2016-05-09 DIAGNOSIS — Z7901 Long term (current) use of anticoagulants: Secondary | ICD-10-CM | POA: Diagnosis not present

## 2016-05-09 DIAGNOSIS — I10 Essential (primary) hypertension: Secondary | ICD-10-CM

## 2016-05-09 DIAGNOSIS — I428 Other cardiomyopathies: Secondary | ICD-10-CM | POA: Diagnosis not present

## 2016-05-09 DIAGNOSIS — Z4502 Encounter for adjustment and management of automatic implantable cardiac defibrillator: Secondary | ICD-10-CM | POA: Insufficient documentation

## 2016-05-09 DIAGNOSIS — Z8249 Family history of ischemic heart disease and other diseases of the circulatory system: Secondary | ICD-10-CM | POA: Diagnosis not present

## 2016-05-09 DIAGNOSIS — I4719 Other supraventricular tachycardia: Secondary | ICD-10-CM | POA: Diagnosis present

## 2016-05-09 DIAGNOSIS — I152 Hypertension secondary to endocrine disorders: Secondary | ICD-10-CM | POA: Diagnosis present

## 2016-05-09 DIAGNOSIS — I4811 Longstanding persistent atrial fibrillation: Secondary | ICD-10-CM | POA: Diagnosis present

## 2016-05-09 DIAGNOSIS — E1159 Type 2 diabetes mellitus with other circulatory complications: Secondary | ICD-10-CM | POA: Diagnosis present

## 2016-05-09 HISTORY — PX: EP IMPLANTABLE DEVICE: SHX172B

## 2016-05-09 LAB — PROTIME-INR
INR: 1.65
PROTHROMBIN TIME: 19.7 s — AB (ref 11.4–15.2)

## 2016-05-09 LAB — SURGICAL PCR SCREEN
MRSA, PCR: NEGATIVE
Staphylococcus aureus: NEGATIVE

## 2016-05-09 SURGERY — ICD/BIV ICD GENERATOR CHANGEOUT

## 2016-05-09 MED ORDER — LIDOCAINE HCL (PF) 1 % IJ SOLN
INTRAMUSCULAR | Status: AC
  Filled 2016-05-09: qty 60

## 2016-05-09 MED ORDER — MUPIROCIN 2 % EX OINT
1.0000 "application " | TOPICAL_OINTMENT | Freq: Once | CUTANEOUS | Status: AC
  Administered 2016-05-09: 1 via TOPICAL

## 2016-05-09 MED ORDER — CEFAZOLIN SODIUM-DEXTROSE 2-4 GM/100ML-% IV SOLN
INTRAVENOUS | Status: AC
  Filled 2016-05-09: qty 100

## 2016-05-09 MED ORDER — ONDANSETRON HCL 4 MG/2ML IJ SOLN: 4.0000 mg | Freq: Four times a day (QID) | INTRAMUSCULAR | Status: DC | PRN

## 2016-05-09 MED ORDER — SODIUM CHLORIDE 0.9 % IV SOLN
INTRAVENOUS | Status: DC
  Administered 2016-05-09: 13:00:00 via INTRAVENOUS

## 2016-05-09 MED ORDER — SODIUM CHLORIDE 0.9 % IR SOLN
Status: AC
  Filled 2016-05-09: qty 2

## 2016-05-09 MED ORDER — MUPIROCIN 2 % EX OINT
TOPICAL_OINTMENT | CUTANEOUS | Status: AC
  Administered 2016-05-09: 1 via TOPICAL
  Filled 2016-05-09: qty 22

## 2016-05-09 MED ORDER — LIDOCAINE HCL (PF) 1 % IJ SOLN
INTRAMUSCULAR | Status: DC | PRN
  Administered 2016-05-09: 50 mL

## 2016-05-09 MED ORDER — ACETAMINOPHEN 325 MG PO TABS: 325.0000 mg | ORAL_TABLET | ORAL | Status: DC | PRN

## 2016-05-09 MED ORDER — HEPARIN (PORCINE) IN NACL 2-0.9 UNIT/ML-% IJ SOLN
INTRAMUSCULAR | Status: AC
  Filled 2016-05-09: qty 500

## 2016-05-09 MED ORDER — CHLORHEXIDINE GLUCONATE 4 % EX LIQD: 60.0000 mL | Freq: Once | CUTANEOUS | Status: DC

## 2016-05-09 MED ORDER — SODIUM CHLORIDE 0.9 % IV SOLN: INTRAVENOUS | Status: AC

## 2016-05-09 SURGICAL SUPPLY — 4 items
CABLE SURGICAL S-101-97-12 (CABLE) ×3 IMPLANT
ICD VIVA XT CRT-D DTBA1D1 (ICD Generator) ×3 IMPLANT
PAD DEFIB LIFELINK (PAD) ×3 IMPLANT
TRAY PACEMAKER INSERTION (PACKS) ×3 IMPLANT

## 2016-05-09 NOTE — H&P (Signed)
ELECTROPHYSIOLOGY H&P  NOTE  Patient ID: [name] [name], MRN: [id number], DOB/AGE: April 02, 1932 82 y.o. Admit date: 05/09/2016 Date of Consult: 05/09/2016  Primary Physician: Clementeen Graham, MD    Chief Complaint: ICD at [facility]    HPI [name] [name] is a 82 y.o. male  Admitted for ICD gen change  Initially CRTD implanted for NICM and primary prevention 2008 with change 2012     Hx of AFib with amiodarone Rx; also with atrial tach; also on warfain   Digoxin level 8/15 was 0.8  echocardiogram 8/14 was 25-30%  Echocardiogram 2/16 EF 35-40% 8/17 EF 35%   8/17     TSH 1.8           ALT      16        Dig o.3             K 3.8  Recent mechanical fall with gait instability  Past Medical History:  Diagnosis Date  . Anemia, unspecified 04/07/2013  . Atrial fibrillation (HCC)    fibrillation/flutter  . BPH (benign prostatic hypertrophy)   . Chronic systolic CHF (congestive heart failure) (HCC)    Nonischemic DCM EF 10% s/p BiV AICD  . Coronary artery disease 2008   nonobstructive ASCAD  . Depression   . Hyperlipidemia   . Hypertension   . Hypothyroidism   . Left bundle branch block   . Nonischemic cardiomyopathy (HCC)    ef 10 %  . OSA (obstructive sleep apnea)    intolerant to CPAP  . Osteoarthritis   . Sleep apnea    stopped using CPAP  . Thrombocytopenia [facility])       Surgical History:  Past Surgical History:  Procedure Laterality Date  . CARDIAC DEFIBRILLATOR PLACEMENT     left chest  . CARDIOVERSION N/A 12/29/2012   Procedure: CARDIOVERSION;  Surgeon: Quintella Reichert, MD;  [facility];  Service: Cardiovascular;  Laterality: N/A;  . CHOLECYSTECTOMY    . Colonscopy     reportedly negative per pt; around 2005.    Marland Kitchen TRANSURETHRAL RESECTION OF PROSTATE     for BPH     Home Meds: Prior to Admission medications   Medication Sig Start Date End Date Taking? Authorizing Provider  AMBULATORY NON FORMULARY MEDICATION Front wheel rolling Kempner for use as  needed due to gait instability. Dispense 1   Fax to [phone]  R26.81 04/06/16  Yes Rodolph Bong, MD  amiodarone (PACERONE) 200 MG tablet Take 200 mg by mouth daily.   Yes Historical Provider, MD  carvedilol (COREG) 3.125 MG tablet Take 1 tablet (3.125 mg total) by mouth 2 (two) times daily. 11/11/15  Yes Quintella Reichert, MD  DIGOX 125 MCG tablet Take 1 tablet by mouth  every other day 03/12/16  Yes Duke Salvia, MD  levothyroxine (SYNTHROID, LEVOTHROID) 137 MCG tablet Take 1 tablet (137 mcg total) by mouth daily before breakfast. 04/21/16  Yes Monica Becton, MD  ramipril (ALTACE) 2.5 MG capsule Take 1 capsule (2.5 mg total) by mouth daily. 03/19/16  Yes Duke Salvia, MD  simvastatin (ZOCOR) 10 MG tablet Take 1 tablet by mouth  every evening 03/12/16  Yes Duke Salvia, MD  spironolactone (ALDACTONE) 25 MG tablet Take 0.5 tablets (12.5 mg total) by mouth daily. 03/12/16  Yes Quintella Reichert, MD  tamsulosin (FLOMAX) 0.4 MG CAPS capsule Take 1 capsule (0.4 mg total) by mouth daily. 04/16/16  Yes Michel Harrow  Denyse Amassorey, MD  torsemide (DEMADEX) 20 MG tablet Take 1 tablet (20 mg total) by mouth daily as needed (weight of 215 lbs or more). 02/16/16  Yes Quintella Reichertraci R Turner, MD  Vitamin D, Ergocalciferol, (DRISDOL) 50000 units CAPS capsule Take 1 capsule (50,000 Units total) by mouth every 7 (seven) days. Take for 8 total doses(weeks) 04/21/16  Yes Monica Bectonhomas J Thekkekandam, MD  warfarin (COUMADIN) 5 MG tablet Take 1 tablet by mouth once daily at [facility]6PM 04/16/16  Yes Rodolph BongEvan S Corey, MD  acetaminophen (TYLENOL) 650 MG CR tablet Take 1 tablet (650 mg total) by mouth every 8 (eight) hours as needed for pain. 11/03/14   Monica Bectonhomas J Thekkekandam, MD  potassium chloride SA (K-DUR,KLOR-CON) 20 MEQ tablet Take 1 tablet (20 mEq total) by mouth daily as needed (with each torsemide dose). 02/16/16   Quintella Reichertraci R Turner, MD    Allergies: No Known Allergies  Social History   Social History  . Marital status: Married    Spouse name: N/A   . Number of children: 4  . Years of education: N/A   Occupational History  . retired     retired Optometristelectrical system controller; now in [employer]  .  Retired   Social History Main Topics  . Smoking status: Never Smoker  . Smokeless tobacco: Never Used  . Alcohol use No  . Drug use: No  . Sexual activity: No   Other Topics Concern  . Not on file   Social History Narrative  . No narrative on file     Family History  Problem Relation Age of Onset  . Heart failure Mother   . Stroke Mother   . Heart disease Father   . Cancer Maternal Uncle     Unknown subtype  . Heart attack Neg Hx     ROS:  Please see the history of present illness.     All other systems reviewed and negative.    Physical Exam: Blood pressure 139/85, pulse 72, temperature 97.8 F (36.6 C), temperature source Oral, resp. rate 20, height 6\' 3"  (1.905 m), weight 210 lb (95.3 kg), SpO2 99 %. General: Well developed, well nourished male in no acute distress. Head: Normocephalic, atraumatic, sclera non-icteric, no xanthomas, nares are without discharge. EENT: normal  Lymph Nodes:  none Neck: Negative for carotid bruits. JVD not elevated. Back:without scoliosis kyphosis Lungs: Clear bilaterally to auscultation without wheezes, rales, or rhonchi. Breathing is unlabored. Device migrated laterally  Heart: RRR with S1 S2. 2 /6 systolic murmur . No rubs, or gallops appreciated. Abdomen: Soft, non-tender, non-distended with normoactive bowel sounds. No hepatomegaly. No rebound/guarding. No obvious abdominal masses. Msk:  Strength and tone appear normal for age. Extremities: No clubbing or cyanosis.  Tr+  edema.  Distal pedal pulses are 2+ and equal bilaterally. Skin: Warm and Dry Neuro: Alert and oriented X 3. CN III-XII intact Grossly normal sensory and motor function . Psych:  Responds to questions appropriately with a normal affect.      Labs: Cardiac Enzymes No results for input(s): CKTOTAL, CKMB,  TROPONINI in the last 72 hours. CBC Lab Results  Component Value Date   WBC 6.4 05/03/2016   HGB 13.2 05/03/2016   HCT 39.8 05/03/2016   MCV 93.0 05/03/2016   PLT 171 05/03/2016   PROTIME:  Recent Labs  05/09/16 1320  LABPROT 19.7*  INR 1.65   Chemistry  Recent Labs Lab 05/03/16 1045  NA 136  K 4.6  CL 100  CO2 27  BUN  28*  CREATININE 1.67*  CALCIUM 8.8  GLUCOSE 140*   Lipids Lab Results  Component Value Date   CHOL 123 (L) 11/18/2015   HDL 43 11/18/2015   LDLCALC 71 11/18/2015   TRIG 44 11/18/2015   BNP Pro B Natriuretic peptide (BNP)  Date/Time Value Ref Range Status  01/22/2013 07:16 PM 6,466.0 (H) 0 - 450 pg/mL Final  12/08/2012 04:34 PM 3,648.0 (H) 0 - 450 pg/mL Final  01/11/2007 05:25 AM 632.0 (H)  Final  01/10/2007 03:25 AM 903.0 (H)  Final   Thyroid Function Tests: No results for input(s): TSH, T4TOTAL, T3FREE, THYROIDAB in the last 72 hours.  Invalid input(s): FREET3 Miscellaneous No results found for: DDIMER  Radiology/Studies:  Ct Lumbar Spine Wo Contrast  Result Date: 04/20/2016 CLINICAL DATA:  Unsteadiness of gait x several weeks, resulting in falls, Pt does use Fineberg. Hx: osteoarthritis. EXAM: CT LUMBAR SPINE WITHOUT CONTRAST TECHNIQUE: Multidetector CT imaging of the lumbar spine was performed without intravenous contrast administration. Multiplanar CT image reconstructions were also generated. COMPARISON:  Lumbar spine radiographs, 03/19/2016. Abdomen and pelvis CT, 04/08/2016. FINDINGS: Segmentation: 5 lumbar type vertebrae. Alignment: Normal. Vertebrae: No fracture or bone lesion. Bones are diffusely demineralized. Paraspinal and other soft tissues: Abdominal aortic ectasia and atherosclerosis. Maximum AP diameter is 3.3 cm, 2.7 cm on the prior CT scan from 2007. There is a small left adrenal mass measuring 13 mm, stable. Status postcholecystectomy. Disc levels: T12-L1:  Unremarkable. L1-L2: Mild facet degenerative change. No significant  disc bulging. No disc herniation. No stenosis. L2-L3: Mild spondylotic disc bulging and mild facet degenerative change. No disc herniation. No significant stenosis. L3-L4: Mild diffuse spondylotic disc bulging. Mild facet degenerative change. Mild narrowing of the central spinal canal, maximum anterior posterior dimension of 1 cm. Mild lateral recess narrowing. Mild neural foraminal narrowing. L4-L5: Mild diffuse spondylotic disc bulging. Moderate, left greater than right, facet degenerative change. Moderate central spinal stenosis of the central spinal canal measuring 7 mm in greatest anterior to posterior dimension. There is moderate narrowing of the superior lateral recesses and mild right and moderate left neural foraminal narrowing. L5-S1: Diffuse spondylotic disc bulging with moderate left and mild-to-moderate right facet degenerative change. No significant central stenosis or lateral recess narrowing. Mild neural foraminal narrowing on the left. IMPRESSION: 1. No fracture or acute finding. 2. Degenerative changes as described with tricompartmental stenosis, greatest at L4-L5 where there is moderate central stenosis, moderate superior lateral recess narrowing and moderate left neural foraminal narrowing. Electronically Signed   By: Amie Portland M.D.   On: 04/20/2016 12:10        Assessment and Plan:   NICM  CHF  ICD-CRT  GAIT INSTABILITY  ATRIAL FIB AND TACH   We have reviewed the benefits and risks of generator replacement.  These include but are not limited to lead fracture and infection.  The patient understands, agrees and is willing to proceed.    Will move the pocket medially  Will stop amio  If need be can do AV ablation for recurrent afib if amio discontinuations helps with symptoms     Sherryl Manges

## 2016-05-09 NOTE — Discharge Instructions (Signed)
Pacemaker Battery Change, Care After °Refer to this sheet in the next few weeks. These instructions provide you with information on caring for yourself after your procedure. Your health care provider may also give you more specific instructions. Your treatment has been planned according to current medical practices, but problems sometimes occur. Call your health care provider if you have any problems or questions after your procedure. °WHAT TO EXPECT AFTER THE PROCEDURE °After your procedure, it is typical to have the following sensations: °· Soreness at the pacemaker site. °HOME CARE INSTRUCTIONS  °· Keep the incision clean and dry. °· Unless advised otherwise, you may shower beginning 48 hours after your procedure. °· For the first week after the replacement, avoid stretching motions that pull at the incision site, and avoid heavy exercise with the arm that is on the same side as the incision. °· Take medicines only as directed by your health care provider. °· Keep all follow-up visits as directed by your health care provider. °SEEK MEDICAL CARE IF:  °· You have pain at the incision site that is not relieved by over-the-counter or prescription medicine. °· There is drainage or pus from the incision site. °· There is swelling larger than a lime at the incision site. °· You develop red streaking that extends above or below the incision site. °· You feel brief, intermittent palpitations, light-headedness, or any symptoms that you feel might be related to your heart. °SEEK IMMEDIATE MEDICAL CARE IF:  °· You experience chest pain that is different than the pain at the pacemaker site. °· You experience shortness of breath. °· You have palpitations or irregular heartbeat. °· You have light-headedness that does not go away quickly. °· You faint. °· You have pain that gets worse and is not relieved by medicine. °This information is not intended to replace advice given to you by your health care provider. Make sure you  discuss any questions you have with your health care provider. °Document Released: 04/01/2013 Document Revised: 07/02/2014 Document Reviewed: 04/01/2013 °Elsevier Interactive Patient Education © 2017 Elsevier Inc. ° °

## 2016-05-09 NOTE — Interval H&P Note (Signed)
ICD Criteria  Current LVEF:35%. Within 12 months prior to implant: Yes   Heart failure history: Yes, Class II  Cardiomyopathy history: Yes, Non-Ischemic Cardiomyopathy.  Atrial Fibrillation/Atrial Flutter: Yes, Persistent (> 7 Days).  Ventricular tachycardia history: No.  Cardiac arrest history: No.  History of syndromes with risk of sudden death: No.  Previous ICD: Yes, Reason for ICD:  Primary prevention.  Current ICD indication: Primary  PPM indication: No.   Class I or II Bradycardia indication present: No  Beta Blocker therapy for 3 or more months: Yes, prescribed.   Ace Inhibitor/ARB therapy for 3 or more months: Yes, prescribed.   History and Physical Interval Note:  05/09/2016 3:24 PM  [name] [name]  has presented today for surgery, with the diagnosis of hf  The various methods of treatment have been discussed with the patient and family. After consideration of risks, benefits and other options for treatment, the patient has consented to  Procedure(s): BIVI ICD [employer] (N/A) as a surgical intervention .  The patient's history has been reviewed, patient examined, no change in status, stable for surgery.  I have reviewed the patient's chart and labs.  Questions were answered to the patient's satisfaction.     Sherryl Manges

## 2016-05-10 ENCOUNTER — Encounter (HOSPITAL_COMMUNITY): Payer: Self-pay | Admitting: Internal Medicine

## 2016-05-10 MED FILL — Heparin Sodium (Porcine) 2 Unit/ML in Sodium Chloride 0.9%: INTRAMUSCULAR | Qty: 500 | Status: AC

## 2016-05-14 ENCOUNTER — Ambulatory Visit (INDEPENDENT_AMBULATORY_CARE_PROVIDER_SITE_OTHER): Payer: Medicare Other | Admitting: Family Medicine

## 2016-05-14 ENCOUNTER — Encounter: Payer: Self-pay | Admitting: Family Medicine

## 2016-05-14 VITALS — BP 109/69 | HR 73 | Wt 216.0 lb

## 2016-05-14 DIAGNOSIS — I4819 Other persistent atrial fibrillation: Secondary | ICD-10-CM

## 2016-05-14 DIAGNOSIS — Z9181 History of falling: Secondary | ICD-10-CM | POA: Diagnosis not present

## 2016-05-14 DIAGNOSIS — I428 Other cardiomyopathies: Secondary | ICD-10-CM | POA: Diagnosis not present

## 2016-05-14 DIAGNOSIS — R2681 Unsteadiness on feet: Secondary | ICD-10-CM | POA: Diagnosis not present

## 2016-05-14 DIAGNOSIS — I481 Persistent atrial fibrillation: Secondary | ICD-10-CM | POA: Diagnosis not present

## 2016-05-14 DIAGNOSIS — I5022 Chronic systolic (congestive) heart failure: Secondary | ICD-10-CM

## 2016-05-14 DIAGNOSIS — E039 Hypothyroidism, unspecified: Secondary | ICD-10-CM | POA: Diagnosis not present

## 2016-05-14 DIAGNOSIS — M48061 Spinal stenosis, lumbar region without neurogenic claudication: Secondary | ICD-10-CM | POA: Diagnosis not present

## 2016-05-14 MED ORDER — LEVOTHYROXINE SODIUM 137 MCG PO TABS: 137.0000 ug | ORAL_TABLET | Freq: Every day | ORAL | 0 refills | Status: DC

## 2016-05-14 MED ORDER — TAMSULOSIN HCL 0.4 MG PO CAPS: 0.4000 mg | ORAL_CAPSULE | Freq: Every day | ORAL | 3 refills | Status: DC

## 2016-05-14 NOTE — Patient Instructions (Signed)
Thank you for coming in today. Continue current treatment.  Return for recheck on Dec 8th.  Return sooner if needed.  Continue the 137 dose of levothyroxine.

## 2016-05-14 NOTE — Progress Notes (Signed)
[name] [name] is a 82 y.o. male who presents to [name]: Primary Care Sports Medicine today for hypothyroid, falls, cardiac.   1) hypothyroidism: Patient was seen by Dr. Benjamin Stain about a month ago as part of a instability falls workup. At that time TSH was obtained and found to be a mildly elevated. His levothyroxine dose is increased from 125 to 137 g. He feels well and has not noted much of a change.  2) falls: Patient has not had many falls since his original fall a few months ago. He continues to receive home health physical therapy services.  3) cardiac: Patient has many cardiology issues including cardiomyopathy atrial fibrillation and need for pacemaker.  He recently had a biventricular pacemaker/defibrillator change out about a week ago. He feels well. He restarted his warfarin a few days ago.   Past Medical History:  Diagnosis Date  . Anemia, unspecified 04/07/2013  . Atrial fibrillation (HCC)    fibrillation/flutter  . BPH (benign prostatic hypertrophy)   . Chronic systolic CHF (congestive heart failure) (HCC)    Nonischemic DCM EF 10% s/p BiV AICD  . Coronary artery disease 2008   nonobstructive ASCAD  . Depression   . Hyperlipidemia   . Hypertension   . Hypothyroidism   . Left bundle branch block   . Nonischemic cardiomyopathy (HCC)    ef 10 %  . OSA (obstructive sleep apnea)    intolerant to CPAP  . Osteoarthritis   . Sleep apnea    stopped using CPAP  . Thrombocytopenia (HCC)    Past Surgical History:  Procedure Laterality Date  . CARDIAC DEFIBRILLATOR PLACEMENT     left chest  . CARDIOVERSION N/A 12/29/2012   Procedure: CARDIOVERSION;  Surgeon: Quintella Reichert, MD;  Location: [facility];  Service: Cardiovascular;  Laterality: N/A;  . CHOLECYSTECTOMY    . Colonscopy     reportedly negative per pt; around 2005.    . EP IMPLANTABLE DEVICE N/A 05/09/2016   Procedure: BIVI ICD Generator Changeout;  Surgeon: Duke Salvia, MD;  Location: [facility] INVASIVE CV LAB;  Service: Cardiovascular;  Laterality: N/A;  . TRANSURETHRAL RESECTION OF PROSTATE     for BPH   Social History  Substance Use Topics  . Smoking status: Never Smoker  . Smokeless tobacco: Never Used  . Alcohol use No   family history includes Cancer in his maternal uncle; Heart disease in his father; Heart failure in his mother; Stroke in his mother.  ROS as above:  Medications: Current Outpatient Prescriptions  Medication Sig Dispense Refill  . acetaminophen (TYLENOL) 650 MG CR tablet Take 1 tablet (650 mg total) by mouth every 8 (eight) hours as needed for pain. 90 tablet 3  . AMBULATORY NON FORMULARY MEDICATION Front wheel rolling [name] for use as needed due to gait instability. Dispense 1   Fax to [phone]  R26.81 1 each 0  . carvedilol (COREG) 3.125 MG tablet Take 1 tablet (3.125 mg total) by mouth 2 (two) times daily. 180 tablet 1  . DIGOX 125 MCG tablet Take 1 tablet by mouth  every other day 45 tablet 3  . levothyroxine (SYNTHROID, LEVOTHROID) 137 MCG tablet Take 1 tablet (137 mcg total) by mouth daily before breakfast. 90 tablet 0  . potassium chloride SA (K-DUR,KLOR-CON) 20 MEQ tablet Take 1 tablet (20 mEq total) by mouth daily as needed (with each torsemide dose). 10 tablet 10  . ramipril (ALTACE) 2.5 MG capsule Take 1  capsule (2.5 mg total) by mouth daily. 90 capsule 3  . simvastatin (ZOCOR) 10 MG tablet Take 1 tablet by mouth  every evening 90 tablet 2  . spironolactone (ALDACTONE) 25 MG tablet Take 0.5 tablets (12.5 mg total) by mouth daily. 45 tablet 3  . tamsulosin (FLOMAX) 0.4 MG CAPS capsule Take 1 capsule (0.4 mg total) by mouth daily. 90 capsule 3  . torsemide (DEMADEX) 20 MG tablet Take 1 tablet (20 mg total) by mouth daily as needed (weight of 215 lbs or more). 10 tablet 10  . Vitamin D, Ergocalciferol, (DRISDOL) 50000 units CAPS capsule Take 1 capsule  (50,000 Units total) by mouth every 7 (seven) days. Take for 8 total doses(weeks) 8 capsule 0  . warfarin (COUMADIN) 5 MG tablet Take 1 tablet by mouth once daily at 6PM 90 tablet 0   No current facility-administered medications for this visit.    No Known Allergies  Health Maintenance Health Maintenance  Topic Date Due  . ZOSTAVAX  11/28/2016 (Originally 05/28/1994)  . PNA vac Low Risk Adult (1 of 2 - PCV13) 11/28/2016 (Originally 05/29/1999)  . TETANUS/TDAP  07/01/2019  . INFLUENZA VACCINE  Completed     Exam:  BP 109/69   Pulse 73   Wt 216 lb (98 kg)   BMI 27.00 kg/m  Gen: Well NAD Nontoxic appearing HEENT: EOMI,  MMM Lungs: Normal work of breathing. CTABL Heart: RRR no MRG Abd: NABS, Soft. Nondistended, Nontender Exts: Brisk capillary refill, warm and well perfused.  Patient uses a Lunt to ambulate   No results found for this or any previous visit (from the past 72 hour(s)). No results found.    Assessment and Plan: 82 y.o. male with  Hypothyroidism: TSH recently elevated. Recheck levels in about 2 months.  Falls: Continue physical therapy. Plan for family meeting next month to discuss long-term goals.  Cardiac/anticoagulation: Continue co-management with cardiology. Continue warfarin. Recheck warfarin in a few weeks at next checkup.   No orders of the defined types were placed in this encounter.   Discussed warning signs or symptoms. Please see discharge instructions. Patient expresses understanding.

## 2016-05-21 ENCOUNTER — Ambulatory Visit (INDEPENDENT_AMBULATORY_CARE_PROVIDER_SITE_OTHER): Payer: Medicare Other | Admitting: *Deleted

## 2016-05-21 DIAGNOSIS — Z95810 Presence of automatic (implantable) cardiac defibrillator: Secondary | ICD-10-CM

## 2016-05-21 NOTE — Progress Notes (Signed)
Wound check appointment. Steri-strips removed. Wound without redness or edema. Small round opening noted at right later corner of incision, 2-steri-strips applied. Normal device function. Thresholds, sensing, and impedances consistent with implant measurements. Device programmed at  chronic lead outputs. Histogram distribution appropriate for patient and level of activity. No mode switches or ventricular arrhythmias noted. Patient educated about wound care, arm mobility, shock plan. ROV with device clinic for wound recheck12/06/2015. ROV with SK 08/11/2015

## 2016-05-22 DIAGNOSIS — M48061 Spinal stenosis, lumbar region without neurogenic claudication: Secondary | ICD-10-CM | POA: Diagnosis not present

## 2016-05-22 DIAGNOSIS — R2681 Unsteadiness on feet: Secondary | ICD-10-CM | POA: Diagnosis not present

## 2016-05-22 DIAGNOSIS — Z9181 History of falling: Secondary | ICD-10-CM | POA: Diagnosis not present

## 2016-05-23 ENCOUNTER — Ambulatory Visit: Payer: Medicare Other | Admitting: Cardiology

## 2016-05-24 DIAGNOSIS — Z9181 History of falling: Secondary | ICD-10-CM | POA: Diagnosis not present

## 2016-05-24 DIAGNOSIS — M48061 Spinal stenosis, lumbar region without neurogenic claudication: Secondary | ICD-10-CM | POA: Diagnosis not present

## 2016-05-24 DIAGNOSIS — R2681 Unsteadiness on feet: Secondary | ICD-10-CM | POA: Diagnosis not present

## 2016-05-25 ENCOUNTER — Ambulatory Visit (INDEPENDENT_AMBULATORY_CARE_PROVIDER_SITE_OTHER): Payer: Medicare Other | Admitting: *Deleted

## 2016-05-25 DIAGNOSIS — Z95810 Presence of automatic (implantable) cardiac defibrillator: Secondary | ICD-10-CM

## 2016-05-25 MED ORDER — CEPHALEXIN 500 MG PO CAPS: 500.0000 mg | ORAL_CAPSULE | Freq: Two times a day (BID) | ORAL | 0 refills | Status: AC

## 2016-05-25 NOTE — Patient Instructions (Addendum)
Keep pinhole openings covered with antibiotic ointment and bandaids until you come back in the office.  If you shower or if the bandaid falls off, please reapply with the provided supplies.  Please take the Keflex twice daily with meals.  Take for the full 7 days (you will use all of the pills in the bottle).  It should be available for pick-up from your pharmacy today.    If you notice any signs or symptoms of infection, including drainage, fever, or chills, please call the Device Clinic at [phone].

## 2016-05-25 NOTE — Progress Notes (Signed)
Wound recheck in clinic.  Steri-strips removed. Small unapproximated areas remain on medial and left lateral incision.  No stitch noted at site, Dr. Graciela Husbands also noted no stitch.  Dr. Graciela Husbands recommended applying antibiotic ointment and bandaid over areas, Keflex 500mg  BID ordered x7 day course.  Patient educated about Keflex and wound care.  Additional dressing supplies provided.  Patient agrees to call the Device Clinic if he notes any signs or symptoms of infection.  ROV with Device Clinic on 05/31/16 for wound recheck while Dr. Graciela Husbands is in the office.

## 2016-05-28 DIAGNOSIS — Z9181 History of falling: Secondary | ICD-10-CM | POA: Diagnosis not present

## 2016-05-28 DIAGNOSIS — R2681 Unsteadiness on feet: Secondary | ICD-10-CM | POA: Diagnosis not present

## 2016-05-28 DIAGNOSIS — M48061 Spinal stenosis, lumbar region without neurogenic claudication: Secondary | ICD-10-CM | POA: Diagnosis not present

## 2016-05-30 DIAGNOSIS — R2681 Unsteadiness on feet: Secondary | ICD-10-CM | POA: Diagnosis not present

## 2016-05-30 DIAGNOSIS — M48061 Spinal stenosis, lumbar region without neurogenic claudication: Secondary | ICD-10-CM | POA: Diagnosis not present

## 2016-05-30 DIAGNOSIS — Z9181 History of falling: Secondary | ICD-10-CM | POA: Diagnosis not present

## 2016-05-31 ENCOUNTER — Other Ambulatory Visit: Payer: Self-pay | Admitting: Cardiology

## 2016-05-31 ENCOUNTER — Ambulatory Visit: Payer: Medicare Other | Admitting: *Deleted

## 2016-05-31 DIAGNOSIS — Z95810 Presence of automatic (implantable) cardiac defibrillator: Secondary | ICD-10-CM

## 2016-06-01 ENCOUNTER — Encounter: Payer: Self-pay | Admitting: Family Medicine

## 2016-06-01 ENCOUNTER — Ambulatory Visit (INDEPENDENT_AMBULATORY_CARE_PROVIDER_SITE_OTHER): Payer: Medicare Other | Admitting: Family Medicine

## 2016-06-01 VITALS — BP 112/63 | HR 72

## 2016-06-01 DIAGNOSIS — I5022 Chronic systolic (congestive) heart failure: Secondary | ICD-10-CM

## 2016-06-01 NOTE — Patient Instructions (Signed)
Thank you for coming in today.  I will ask [facility] care management to help.  We will investigate Life Alert type services through [employer]. We will also investigate [employer].   Please return in February or sooner as needed.

## 2016-06-01 NOTE — Progress Notes (Signed)
Patient came in for wound re-check. Band-Aids to left lateral incision removed. Small whole present to left lateral incision with scant yellow drainage present on band-aid upon removal. Sk assessed and instructed patient to apply antibiotic ointment and a band-aid to the incision and continue to monitor. Patient will call if site does not continue to improve or if patient identifies anys/s of infection. ROV with SK 2/16.

## 2016-06-01 NOTE — Progress Notes (Signed)
Family meeting:  [name] [name] and [name]. Today for a family meeting including 2 daughters and 1 son. We spent approximately 1 hour discussing as a group goals of care plan for increased help around the house and need for additional services. Plan for [name] [name] (Son) to help out more around the house. Plan for [name] [name] daughters to continue to help and provide meals. We will research life alert type program through [place] [facility] as well as refer both Jolly Mango for [facility] care management services if possible. Plan to recheck in 1 month.   Vitals:   06/01/16 1142  BP: 112/63  Pulse: 72    Patient Active Problem List   Diagnosis Date Noted  . Positive RPR test 04/21/2016  . Gait instability 04/06/2016  . Spinal stenosis of lumbar region 03/19/2016  . Abdominal aortic aneurysm (HCC) 03/19/2016  . Cholecystitis 02/04/2016  . Primary osteoarthritis of left hip 11/25/2015  . Lung crackles 11/17/2014  . Lung nodule 11/17/2014  . Primary osteoarthritis of both knees 11/03/2014  . Abnormal PFTs (pulmonary function tests) 04/07/2014  . BPH (benign prostatic hyperplasia) 01/13/2014  . Chronic renal insufficiency, stage III (moderate) 01/13/2014  . Chronic anticoagulation 04/09/2013  . HTN (hypertension) 04/09/2013  . CAD (coronary artery disease) 04/09/2013  . Anemia 04/07/2013  . Chronic systolic heart failure (HCC) 02/17/2013  . Nonischemic cardiomyopathy (HCC)   . Left bundle branch block   . Thrombocytopenia (HCC)   . Biventricular implantable cardioverter-defibrillator-Medtronic 01/10/2011  . Dyspnea on exertion 08/09/2010  . Hypothyroidism 06/22/2009  . Hyperlipidemia 06/22/2009  . DEPRESSION 06/22/2009  . FIBRILLATION, ATRIAL 06/22/2009  . OSA (obstructive sleep apnea) 06/22/2009    I spent 40 minutes with this patient, greater than 50% was face-to-face time counseling regarding the above diagnosis.

## 2016-06-05 ENCOUNTER — Other Ambulatory Visit: Payer: Self-pay | Admitting: *Deleted

## 2016-06-05 ENCOUNTER — Encounter: Payer: Self-pay | Admitting: *Deleted

## 2016-06-05 DIAGNOSIS — I5022 Chronic systolic (congestive) heart failure: Secondary | ICD-10-CM

## 2016-06-05 NOTE — Patient Outreach (Signed)
[facility]) Care Management  06/05/2016  [name] [name] Apr 15, 1932 [id number]   RN spoke with pt today and introduced the [facility] services and available services. RN discussed the possible for today's call and offered [facility] services to assist with his ongoing needs. Pt verifies he has HF and aware of his parameters for taking the extra medications (K-Dur and Torsemide) that has been discussed with him and his provider if he goes over 215 lbs. Pt states his weight has been consisting in a range of 213 lbs over the last three days with no signs or symptoms of distress or swelling related to HF. Pt states he has a pacemaker and his debrillator was recently changed. Pt states his current issues related to balance  Related to his mobility. States he had PT service over the last two months that involved several exercises regimens and he is trying to maintain this routine but he states he still has an unsteady balance at times but no falls were reported. RN inquired on possible needs and confirmed no needs for transportation however interested in services related to food services and possible house cleaning. States" I need help in the home organizing things around the home". RN attempted to request more specific services for the referral consult with the social worker however limited. RN offered home visits with this RN to assist further with his needs however pt feels he has no nursing needs and only wishes to consult with a Child psychotherapist. Note pt's spouse is a Engineer, civil (consulting) and was also referred for [facility] services. RN will make a referral accordingly for a social work consult to address the above needs. No other request or inquires at this time. RN has informed pt that his provider will be notified of his decision to decline [facility] for community nursing for home visits however will remains active with social work services. Will make the referral accordingly and close from this case management services.   Elliot Cousin, RN Care Management Coordinator [facility] Main Office [phone]

## 2016-06-05 NOTE — Addendum Note (Signed)
Addended by: Elliot Cousin D on: 06/05/2016 06:00 PM   Modules accepted: Orders

## 2016-06-08 ENCOUNTER — Encounter: Payer: Self-pay | Admitting: *Deleted

## 2016-06-08 ENCOUNTER — Encounter: Payer: Medicare Other | Admitting: Internal Medicine

## 2016-06-12 ENCOUNTER — Other Ambulatory Visit: Payer: Self-pay | Admitting: *Deleted

## 2016-06-13 ENCOUNTER — Encounter: Payer: Self-pay | Admitting: *Deleted

## 2016-06-13 NOTE — Patient Outreach (Signed)
[facility]) Care Management  06/13/2016  SPENCER CARDINAL 1932-02-22 [id number]   CSW was able to make initial contact with patient today to perform the phone assessment, as well as assess and assist with social work needs and services.  CSW introduced self, explained role and types of services provided through [place] [facility] ([facility]).  CSW further explained to patient that CSW works with patient's Telephonic RNCM, also with [facility], Crystal Hutchinson. CSW then explained the reason for the call, indicating that Mrs. Hutchinson thought that patient would benefit from social work services and resources to assist with resources and referrals for cleaning services in the [place], [place] area.  CSW obtained two HIPAA compliant identifiers from patient, which included patient's name and date of birth. According to Mr. Ducksworth, he and his wife, Cosmo Tetreault are in need of someone to come into the home on a regular basis to "clean up the kitchen, do laundry, mop the floors, etc."  Mr. Marcoux admits that he and Mrs. Putnam are no longer able to perform these activities of daily living, and that their two daughter's are only able to take turns coming out once a week to perform these activities for them.  CSW was able to provide Mr. Schank with the following list of resources, explaining that insurance does not cover these types of services, that all services will be out-of-pocket:  [facility] [place], [place] [phone] Open until 9:00 PM  [facility], [place], [place] [phone] Open until 6:00 PM  [address] of the [place], [place] [phone] Open until 5:00 PM  [facility] [place], [place] [phone] Opens at 9:00 AM  [facility] of the [place], [place] [phone] Open until 5:00 PM  [facility] [place],  [place] [phone] Opens at 9:00 AM  [facility] [place], [place] [phone] Open until [place], [place] [phone]  [address] [place], [place] [phone] Open until 6:00 PM  [address] [place], [place] [phone]  [place], [place] [phone] Open until 5:00 PM  [facility] [place], [place] [phone] Open until 5:00 PM  [facility] [place], [place] [phone]  Mr. Ramey voiced understanding and was agreeable to having his daughters contact these agencies to obtain prices and services provided.  CSW provided Mr. Kuhrt with CSW's contact information, encouraging him or Mrs. Burruel to contact CSW anytime in the near future, if additional social work needs are identified.   CSW will perform a case closure on patient, as all goals of treatment have been met from social work standpoint and no additional social work needs have been identified at this time.  CSW will notify patient's Telephonic RNCM with [facility], Odis Hollingshead of CSW's plans to close patient's case.  CSW will fax an update to patient's Primary Care Physician, Dr. Lynne Leader to ensure that they are aware of CSW's involvement with patient's plan of care.  CSW will submit a case closure request to Josepha Pigg, Care Management Assistant with [facility], in the form of an [employer].  CSW will ensure that Mrs. Quentin Cornwall is aware of Mrs. Hutchinson's, RNCM with [facility], continued involvement with patient's care. Nat Christen, BSW, MSW, [employer]  Licensed Holiday representative  [facility]  [place]  M[address] [place] [address] [place], [place] [address] Physical Address-[address], [place], [place]  [address] Toll Free Main # [phone] Fax # [phone] Cell # [phone]  Fax # [phone]  [email or web address]

## 2016-06-26 ENCOUNTER — Encounter: Payer: Self-pay | Admitting: Family Medicine

## 2016-07-02 ENCOUNTER — Other Ambulatory Visit: Payer: Self-pay | Admitting: Family Medicine

## 2016-07-02 DIAGNOSIS — E039 Hypothyroidism, unspecified: Secondary | ICD-10-CM

## 2016-07-03 ENCOUNTER — Ambulatory Visit: Payer: Medicare Other | Admitting: Family Medicine

## 2016-07-04 ENCOUNTER — Encounter: Payer: Self-pay | Admitting: Family Medicine

## 2016-07-04 ENCOUNTER — Ambulatory Visit (INDEPENDENT_AMBULATORY_CARE_PROVIDER_SITE_OTHER): Payer: Medicare Other | Admitting: Family Medicine

## 2016-07-04 VITALS — BP 118/63 | HR 85 | Temp 98.6°F | Wt 221.0 lb

## 2016-07-04 DIAGNOSIS — M170 Bilateral primary osteoarthritis of knee: Secondary | ICD-10-CM

## 2016-07-04 DIAGNOSIS — I4819 Other persistent atrial fibrillation: Secondary | ICD-10-CM

## 2016-07-04 DIAGNOSIS — R05 Cough: Secondary | ICD-10-CM | POA: Diagnosis not present

## 2016-07-04 DIAGNOSIS — I481 Persistent atrial fibrillation: Secondary | ICD-10-CM

## 2016-07-04 DIAGNOSIS — I5022 Chronic systolic (congestive) heart failure: Secondary | ICD-10-CM

## 2016-07-04 DIAGNOSIS — E039 Hypothyroidism, unspecified: Secondary | ICD-10-CM | POA: Diagnosis not present

## 2016-07-04 DIAGNOSIS — R059 Cough, unspecified: Secondary | ICD-10-CM

## 2016-07-04 DIAGNOSIS — M79671 Pain in right foot: Secondary | ICD-10-CM | POA: Diagnosis not present

## 2016-07-04 MED ORDER — BENZONATATE 200 MG PO CAPS: 200.0000 mg | ORAL_CAPSULE | Freq: Three times a day (TID) | ORAL | 0 refills | Status: DC | PRN

## 2016-07-04 NOTE — Progress Notes (Signed)
[name] [name] is a 83 y.o. male who presents to [name]: Primary Care Sports Medicine today for cough, left knee pain, right heel pain.  Cough: Patient is a 5 day history of nonproductive cough. He notes it's improving now compared to early last week. He denies significant shortness of breath fevers or chills. He's tried some over-the-counter medicines which have helped a bit.  Left knee pain: Patient notes worsening left knee pain. He has a history of severe DJD status post several injections most recently steroid injections in August 2017. He denies any repeat injury to his left knee. He notes the pain is moderate to severe and occasionally causes his need to give way. He ambulates using a Derasmo. He has a history of severe fall last autumn.  Right heel pain: Patient is a one-week history of burning pain with medial heel. He notes this tends to wake up from sleep. He denies any injury. He has not tried any treatment yet.   Past Medical History:  Diagnosis Date  . Anemia, unspecified 04/07/2013  . Atrial fibrillation (HCC)    fibrillation/flutter  . BPH (benign prostatic hypertrophy)   . Chronic systolic CHF (congestive heart failure) (HCC)    Nonischemic DCM EF 10% s/p BiV AICD  . Coronary artery disease 2008   nonobstructive ASCAD  . Depression   . Hyperlipidemia   . Hypertension   . Hypothyroidism   . Left bundle branch block   . Nonischemic cardiomyopathy (HCC)    ef 10 %  . OSA (obstructive sleep apnea)    intolerant to CPAP  . Osteoarthritis   . Sleep apnea    stopped using CPAP  . Thrombocytopenia (HCC)    Past Surgical History:  Procedure Laterality Date  . CARDIAC DEFIBRILLATOR PLACEMENT     left chest  . CARDIOVERSION N/A 12/29/2012   Procedure: CARDIOVERSION;  Surgeon: Quintella Reichert, MD;  [facility];  Service: Cardiovascular;  Laterality: N/A;  .  CHOLECYSTECTOMY    . Colonscopy     reportedly negative per pt; around 2005.    . EP IMPLANTABLE DEVICE N/A 05/09/2016   Procedure: BIVI ICD Generator Changeout;  Surgeon: Duke Salvia, MD;  [facility]: [facility] CV LAB;  Service: Cardiovascular;  Laterality: N/A;  . TRANSURETHRAL RESECTION OF PROSTATE     for BPH   Social History  Substance Use Topics  . Smoking status: Never Smoker  . Smokeless tobacco: Never Used  . Alcohol use No   family history includes Cancer in his maternal uncle; Heart disease in his father; Heart failure in his mother; Stroke in his mother.  ROS as above:  Medications: Current Outpatient Prescriptions  Medication Sig Dispense Refill  . acetaminophen (TYLENOL) 650 MG CR tablet Take 1 tablet (650 mg total) by mouth every 8 (eight) hours as needed for pain. 90 tablet 3  . AMBULATORY NON FORMULARY MEDICATION Front wheel rolling [place] for use as needed due to gait instability. Dispense 1   Fax to [phone]  R26.81 1 each 0  . carvedilol (COREG) 3.125 MG tablet Take 1 tablet (3.125 mg total) by mouth 2 (two) times daily. 180 tablet 2  . DIGOX 125 MCG tablet Take 1 tablet by mouth  every other day 45 tablet 3  . levothyroxine (SYNTHROID, LEVOTHROID) 137 MCG tablet TAKE 1 TABLET BY MOUTH  DAILY BEFORE BREAKFAST 90 tablet 1  . potassium chloride SA (K-DUR,KLOR-CON) 20 MEQ tablet Take 1 tablet (  20 mEq total) by mouth daily as needed (with each torsemide dose). 10 tablet 10  . ramipril (ALTACE) 2.5 MG capsule Take 1 capsule (2.5 mg total) by mouth daily. 90 capsule 3  . simvastatin (ZOCOR) 10 MG tablet Take 1 tablet by mouth  every evening 90 tablet 2  . spironolactone (ALDACTONE) 25 MG tablet Take 0.5 tablets (12.5 mg total) by mouth daily. 45 tablet 3  . tamsulosin (FLOMAX) 0.4 MG CAPS capsule Take 1 capsule (0.4 mg total) by mouth daily. 90 capsule 3  . torsemide (DEMADEX) 20 MG tablet Take 1 tablet (20 mg total) by mouth daily as needed (weight of 215 lbs  or more). 10 tablet 10  . Vitamin D, Ergocalciferol, (DRISDOL) 50000 units CAPS capsule Take 1 capsule (50,000 Units total) by mouth every 7 (seven) days. Take for 8 total doses(weeks) 8 capsule 0  . warfarin (COUMADIN) 5 MG tablet Take 1 tablet by mouth once daily at 6PM 90 tablet 0  . benzonatate (TESSALON) 200 MG capsule Take 1 capsule (200 mg total) by mouth 3 (three) times daily as needed for cough. 45 capsule 0   No current facility-administered medications for this visit.    No Known Allergies  Health Maintenance Health Maintenance  Topic Date Due  . ZOSTAVAX  11/28/2016 (Originally 05/28/1994)  . PNA vac Low Risk Adult (1 of 2 - PCV13) 11/28/2016 (Originally 05/29/1999)  . TETANUS/TDAP  07/01/2019  . INFLUENZA VACCINE  Completed     Exam:  BP 118/63   Pulse 85   Temp 98.6 F (37 C) (Oral)   Wt 221 lb (100.2 kg)   SpO2 98%   BMI 27.62 kg/m  Gen: Well NAD HEENT: EOMI,  MMM Lungs: Normal work of breathing. CTABL Heart: Irregular no MRG Abd: NABS, Soft. Nondistended, Nontender Exts: Brisk capillary refill, warm and well perfused.  MSK: Left knee moderate effusion. Nontender. Decreased motion due to pain.  Right heel: Ecchymosis and contusion appearing area at the medial calcaneus mildly tender to palpation.   Procedure: Real-time Ultrasound Guided Injection of left knee  Device: GE Logiq E  Images permanently stored and available for review in the ultrasound unit. Verbal informed consent obtained. Discussed risks and benefits of procedure. Warned about infection bleeding damage to structures skin hypopigmentation and fat atrophy among others. Patient expresses understanding and agreement Time-out conducted.  Noted no overlying erythema, induration, or other signs of local infection.  Skin prepped in a sterile fashion.  Local anesthesia: Topical Ethyl chloride.  With sterile technique and under real time ultrasound guidance: 80 mg of Kenalog and 4 mL of  Marcaine injected easily.  Completed without difficulty  Pain immediately resolved suggesting accurate placement of the medication.  Advised to call if fevers/chills, erythema, induration, drainage, or persistent bleeding.  Images permanently stored and available for review in the ultrasound unit.  Impression: Technically successful ultrasound guided injection.    No results found for this or any previous visit (from the past 72 hour(s)). No results found.    Assessment and Plan: 83 y.o. male with  Cough: Likely viral. Patient appears to be improving. Will treat empirically with Tessalon Perles.  Left knee pain: DJD present. Status post steroid injection today.  Right heel pain: Likely contusion due to sleep positioning. Recommend using a throat pillow. Recheck if not improving.  Anticoagulation: Check INR today as patient is due.  Hypothyroidism: Check TSH today as patient is due.   Orders Placed This Encounter  Procedures  . TSH  .  INR/PT    Discussed warning signs or symptoms. Please see discharge instructions. Patient expresses understanding.

## 2016-07-04 NOTE — Patient Instructions (Addendum)
Thank you for coming in today. Call or go to the emergency room if you get worse, have trouble breathing, have chest pains, or palpitations.  Use the tessalon as needed.   Call or go to the ER if you develop a large red swollen joint with extreme pain or oozing puss.   Get labs today.

## 2016-07-05 LAB — PROTIME-INR
INR: 1.7 — ABNORMAL HIGH
Prothrombin Time: 17.6 s — ABNORMAL HIGH (ref 9.0–11.5)

## 2016-07-05 LAB — TSH: TSH: 1.58 mIU/L (ref 0.40–4.50)

## 2016-07-05 MED ORDER — WARFARIN SODIUM 1 MG PO TABS: ORAL_TABLET | ORAL | 0 refills | Status: DC

## 2016-07-05 NOTE — Addendum Note (Signed)
Addended by: Rodolph Bong on: 07/05/2016 07:23 AM   Modules accepted: Orders

## 2016-07-09 ENCOUNTER — Other Ambulatory Visit: Payer: Self-pay

## 2016-07-09 MED ORDER — BENZONATATE 200 MG PO CAPS: 200.0000 mg | ORAL_CAPSULE | Freq: Three times a day (TID) | ORAL | 0 refills | Status: DC | PRN

## 2016-07-09 MED ORDER — WARFARIN SODIUM 1 MG PO TABS: ORAL_TABLET | ORAL | 0 refills | Status: DC

## 2016-07-13 ENCOUNTER — Other Ambulatory Visit: Payer: Self-pay

## 2016-07-13 ENCOUNTER — Ambulatory Visit (INDEPENDENT_AMBULATORY_CARE_PROVIDER_SITE_OTHER): Payer: Medicare Other | Admitting: Cardiology

## 2016-07-13 VITALS — BP 122/64 | HR 74 | Ht 76.0 in | Wt 217.8 lb

## 2016-07-13 DIAGNOSIS — I428 Other cardiomyopathies: Secondary | ICD-10-CM

## 2016-07-13 DIAGNOSIS — I5022 Chronic systolic (congestive) heart failure: Secondary | ICD-10-CM

## 2016-07-13 DIAGNOSIS — I10 Essential (primary) hypertension: Secondary | ICD-10-CM | POA: Diagnosis not present

## 2016-07-13 DIAGNOSIS — I481 Persistent atrial fibrillation: Secondary | ICD-10-CM | POA: Diagnosis not present

## 2016-07-13 DIAGNOSIS — I4819 Other persistent atrial fibrillation: Secondary | ICD-10-CM

## 2016-07-13 DIAGNOSIS — I2510 Atherosclerotic heart disease of native coronary artery without angina pectoris: Secondary | ICD-10-CM

## 2016-07-13 DIAGNOSIS — E7800 Pure hypercholesterolemia, unspecified: Secondary | ICD-10-CM

## 2016-07-13 DIAGNOSIS — E785 Hyperlipidemia, unspecified: Secondary | ICD-10-CM

## 2016-07-13 MED ORDER — SACUBITRIL-VALSARTAN 24-26 MG PO TABS: 1.0000 | ORAL_TABLET | Freq: Two times a day (BID) | ORAL | Status: DC

## 2016-07-13 NOTE — Progress Notes (Signed)
Cardiology Office Note    Date:  07/13/2016   ID:  [name] [name], DOB 1931/08/31, MRN [id number]  PCP:  Clementeen Graham, MD  Cardiologist:  Armanda Magic, MD   Chief Complaint  Patient presents with  . Follow-up  . Coronary Artery Disease  . Hypertension  . Atrial Fibrillation  . Hyperlipidemia    History of Present Illness:  [name] [name] is a 83 y.o. male with a history of nonobstructive ASCAD, NICM by cath 2008 (EF 35% by echo 01/2016) with chronic CHF s/p Medtronic CRT-D, LBBB, CRI (baseline 1.7-1.8), HTN, PAF, OSA (intolerant to CPAP) and dyslipidemia who presents for followup. He is followed in Advanced CHF clinic for NYHA class II-III CHF. He is doing much better. His weight is up 5lbs from November on our scales.  At home he weighs daily and usually is around 210-213lbs and today it was 209lbs.  He denies any chest pain or pressure.  He denies any SOB, DOE, LE edema, palpitations or syncope. He sleeps well and denies any PND or orthopnea.  He occasionally will have some dizziness when going from sitting to standing after getting out of bed. He was taken off Amio by Dr. Graciela Husbands due to problems with balance and thinks that his balance problems have improved.  He walks some for exercise.    Past Medical History:  Diagnosis Date  . Anemia, unspecified 04/07/2013  . Atrial fibrillation (HCC)    fibrillation/flutter  . BPH (benign prostatic hypertrophy)   . Chronic systolic CHF (congestive heart failure) (HCC)    Nonischemic DCM EF 10% s/p BiV AICD  . Coronary artery disease 2008   nonobstructive ASCAD  . Depression   . Hyperlipidemia   . Hypertension   . Hypothyroidism   . Left bundle branch block   . Nonischemic cardiomyopathy (HCC)    ef 10 %  . OSA (obstructive sleep apnea)    intolerant to CPAP  . Osteoarthritis   . Sleep apnea    stopped using CPAP  . Thrombocytopenia (HCC)     Past Surgical History:  Procedure Laterality Date  . CARDIAC DEFIBRILLATOR  PLACEMENT     left chest  . CARDIOVERSION N/A 12/29/2012   Procedure: CARDIOVERSION;  Surgeon: Quintella Reichert, MD;  [facility];  Service: Cardiovascular;  Laterality: N/A;  . CHOLECYSTECTOMY    . Colonscopy     reportedly negative per pt; around 2005.    . EP IMPLANTABLE DEVICE N/A 05/09/2016   Procedure: BIVI ICD Generator Changeout;  Surgeon: Duke Salvia, MD;  [facility] INVASIVE CV LAB;  Service: Cardiovascular;  Laterality: N/A;  . TRANSURETHRAL RESECTION OF PROSTATE     for BPH    Current Medications: Outpatient Medications Prior to Visit  Medication Sig Dispense Refill  . acetaminophen (TYLENOL) 650 MG CR tablet Take 1 tablet (650 mg total) by mouth every 8 (eight) hours as needed for pain. 90 tablet 3  . AMBULATORY NON FORMULARY MEDICATION Front wheel rolling [place] for use as needed due to gait instability. Dispense 1   Fax to [phone]  R26.81 1 each 0  . benzonatate (TESSALON) 200 MG capsule Take 1 capsule (200 mg total) by mouth 3 (three) times daily as needed for cough. 45 capsule 0  . carvedilol (COREG) 3.125 MG tablet Take 1 tablet (3.125 mg total) by mouth 2 (two) times daily. 180 tablet 2  . DIGOX 125 MCG tablet Take 1 tablet by mouth  every other day 45 tablet  3  . levothyroxine (SYNTHROID, LEVOTHROID) 137 MCG tablet TAKE 1 TABLET BY MOUTH  DAILY BEFORE BREAKFAST 90 tablet 1  . potassium chloride SA (K-DUR,KLOR-CON) 20 MEQ tablet Take 1 tablet (20 mEq total) by mouth daily as needed (with each torsemide dose). 10 tablet 10  . ramipril (ALTACE) 2.5 MG capsule Take 1 capsule (2.5 mg total) by mouth daily. 90 capsule 3  . simvastatin (ZOCOR) 10 MG tablet Take 1 tablet by mouth  every evening 90 tablet 2  . spironolactone (ALDACTONE) 25 MG tablet Take 0.5 tablets (12.5 mg total) by mouth daily. 45 tablet 3  . torsemide (DEMADEX) 20 MG tablet Take 1 tablet (20 mg total) by mouth daily as needed (weight of 215 lbs or more). 10 tablet 10  . Vitamin D,  Ergocalciferol, (DRISDOL) 50000 units CAPS capsule Take 1 capsule (50,000 Units total) by mouth every 7 (seven) days. Take for 8 total doses(weeks) 8 capsule 0  . warfarin (COUMADIN) 1 MG tablet Take with warfarin 5mg  pills pd on Tuesdays and Fridays. 90 tablet 0  . warfarin (COUMADIN) 1 MG tablet Take 1 tablet by mouth on Tuesday and Friday in addition to 5 mg tablet. 14 tablet 0  . warfarin (COUMADIN) 5 MG tablet Take 1 tablet by mouth once daily at 6PM 90 tablet 0  . tamsulosin (FLOMAX) 0.4 MG CAPS capsule Take 1 capsule (0.4 mg total) by mouth daily. (Patient not taking: Reported on 07/13/2016) 90 capsule 3   No facility-administered medications prior to visit.      Allergies:   Patient has no known allergies.   Social History   Social History  . Marital status: Married    Spouse name: N/A  . Number of children: 4  . Years of education: N/A   Occupational History  . retired     retired Optometrist; now in Research officer, political party  .  Retired   Social History Main Topics  . Smoking status: Never Smoker  . Smokeless tobacco: Never Used  . Alcohol use No  . Drug use: No  . Sexual activity: No   Other Topics Concern  . Not on file   Social History Narrative  . No narrative on file     Family History:  The patient's family history includes Cancer in his maternal uncle; Heart disease in his father; Heart failure in his mother; Stroke in his mother.   ROS:   Please see the history of present illness.    ROS All other systems reviewed and are negative.  No flowsheet data found.     PHYSICAL EXAM:   VS:  BP 122/64   Pulse 74   Ht 6\' 4"  (1.93 m)   Wt 217 lb 12.8 oz (98.8 kg)   SpO2 96%   BMI 26.51 kg/m    GEN: Well nourished, well developed, in no acute distress  HEENT: normal  Neck: no JVD, carotid bruits, or masses Cardiac: RRR; no murmurs, rubs, or gallops,no edema.  Intact distal pulses bilaterally.  Respiratory:  clear to auscultation bilaterally, normal  work of breathing GI: soft, nontender, nondistended, + BS MS: no deformity or atrophy  Skin: warm and dry, no rash Neuro:  Alert and Oriented x 3, Strength and sensation are intact Psych: euthymic mood, full affect  Wt Readings from Last 3 Encounters:  07/13/16 217 lb 12.8 oz (98.8 kg)  07/04/16 221 lb (100.2 kg)  05/14/16 216 lb (98 kg)      Studies/Labs Reviewed:  EKG:  EKG is not ordered today.    Recent Labs: 02/04/2016: B Natriuretic Peptide 390.2; Magnesium 2.1 04/20/2016: ALT 12 05/03/2016: BUN 28; Creat 1.67; Hemoglobin 13.2; Platelets 171; Potassium 4.6; Sodium 136 07/04/2016: TSH 1.58   Lipid Panel    Component Value Date/Time   CHOL 123 (L) 11/18/2015 0940   CHOL 134 07/06/2014 1034   TRIG 44 11/18/2015 0940   TRIG 76 07/06/2014 1034   HDL 43 11/18/2015 0940   HDL 44 07/06/2014 1034   CHOLHDL 2.9 11/18/2015 0940   VLDL 9 11/18/2015 0940   LDLCALC 71 11/18/2015 0940   LDLCALC 75 07/06/2014 1034    Additional studies/ records that were reviewed today include:  none    ASSESSMENT:    1. Persistent atrial fibrillation (HCC)   2. Chronic systolic heart failure (HCC)   3. Essential hypertension   4. Nonischemic cardiomyopathy (HCC)   5. Coronary artery disease involving native coronary artery of native heart without angina pectoris   6. Pure hypercholesterolemia      PLAN:  In order of problems listed above:  1. Persistent atrial fibrillation - maintaining NSR.  Continue BB/digoxin and warfarin. I will check a digoxin level today.  2. Chronic systolic CHF - He appears euvolemic on exam and has not had any SOB or LE edema.  His home weight is stable at 209lbs (dry weight 210-213lbs). He will continue on BB/ aldactone/digoxin and demadex. I am going to have him stop his Altace for 48 hours and then change to [facility].  I will have him followup in HTN/CHF clinic in our office in 2 weeks for uptitration of Entresto along with a BMET then as well. 3.   HTN -  BP controlled on current meds.  Continue BB/ACE I/aldactone. Check BMET. 4.   Nonischemic DCM (EF 35% by echo 01/2016).  He is followed in Advanced HF clinic. 5.   ASCAD - nonobstructive by cath.  He has no anginal symptoms.  Continue BB and statin. No ASA due to warfarin.  6.   Hyperlipidemia - LDL goal < 70. Continue statin. Check FLP and ALT.     Medication Adjustments/Labs and Tests Ordered: Current medicines are reviewed at length with the patient today.  Concerns regarding medicines are outlined above.  Medication changes, Labs and Tests ordered today are listed in the Patient Instructions below.  There are no Patient Instructions on file for this visit.   Signed, Armanda Magic, MD  07/13/2016 2:16 PM    [facility] [address] [place], [place], [place]  [address] Phone: [phone]; Fax: [phone]

## 2016-07-13 NOTE — Patient Instructions (Addendum)
Medication Instructions:  1) STOP ALTACE 2) START ENTRESTO 24mg /26mg  TWICE DAILY on Sunday morning, January 21.  Labwork: TODAY: BMET, Digoxin  Testing/Procedures: None  Follow-Up: Your physician recommends that you schedule a follow-up appointment on 07/30/16 at 2:00PM in the HTN CLINIC to see how you are doing on your Entresto. You will also have labs drawn.   Your physician wants you to follow-up in: 6 months with Dr. Mayford Knife. You will receive a reminder letter in the mail two months in advance. If you don't receive a letter, please call our office to schedule the follow-up appointment.   Any Other Special Instructions Will Be Listed Below (If Applicable).     If you need a refill on your cardiac medications before your next appointment, please call your pharmacy.

## 2016-07-14 LAB — BASIC METABOLIC PANEL
BUN/Creatinine Ratio: 20 (ref 10–24)
BUN: 29 mg/dL — ABNORMAL HIGH (ref 8–27)
CO2: 25 mmol/L (ref 18–29)
CREATININE: 1.45 mg/dL — AB (ref 0.76–1.27)
Calcium: 8.9 mg/dL (ref 8.6–10.2)
Chloride: 99 mmol/L (ref 96–106)
GFR calc Af Amer: 51 mL/min/{1.73_m2} — ABNORMAL LOW (ref >59)
GFR, EST NON AFRICAN AMERICAN: 45 mL/min/{1.73_m2} — AB (ref >59)
Glucose: 193 mg/dL — ABNORMAL HIGH (ref 65–99)
Potassium: 4.3 mmol/L (ref 3.5–5.2)
SODIUM: 138 mmol/L (ref 134–144)

## 2016-07-14 LAB — DIGOXIN LEVEL: Digoxin, Serum: 0.5 ng/mL (ref 0.5–0.9)

## 2016-07-16 ENCOUNTER — Ambulatory Visit: Payer: Medicare Other | Admitting: Podiatry

## 2016-07-25 ENCOUNTER — Encounter: Payer: Self-pay | Admitting: Cardiology

## 2016-07-27 ENCOUNTER — Ambulatory Visit (INDEPENDENT_AMBULATORY_CARE_PROVIDER_SITE_OTHER): Payer: Medicare Other | Admitting: Pharmacist

## 2016-07-27 ENCOUNTER — Other Ambulatory Visit: Payer: Medicare Other | Admitting: *Deleted

## 2016-07-27 VITALS — BP 102/58 | HR 90

## 2016-07-27 DIAGNOSIS — E785 Hyperlipidemia, unspecified: Secondary | ICD-10-CM | POA: Diagnosis not present

## 2016-07-27 DIAGNOSIS — I4819 Other persistent atrial fibrillation: Secondary | ICD-10-CM

## 2016-07-27 DIAGNOSIS — I5022 Chronic systolic (congestive) heart failure: Secondary | ICD-10-CM

## 2016-07-27 DIAGNOSIS — I481 Persistent atrial fibrillation: Secondary | ICD-10-CM | POA: Diagnosis not present

## 2016-07-27 DIAGNOSIS — I10 Essential (primary) hypertension: Secondary | ICD-10-CM

## 2016-07-27 LAB — LIPID PANEL
CHOL/HDL RATIO: 2.7 ratio (ref 0.0–5.0)
Cholesterol, Total: 124 mg/dL (ref 100–199)
HDL: 46 mg/dL (ref >39)
LDL CALC: 68 mg/dL (ref 0–99)
Triglycerides: 49 mg/dL (ref 0–149)
VLDL CHOLESTEROL CAL: 10 mg/dL (ref 5–40)

## 2016-07-27 LAB — HEPATIC FUNCTION PANEL
ALBUMIN: 4.3 g/dL (ref 3.5–4.7)
ALT: 18 IU/L (ref 0–44)
AST: 21 IU/L (ref 0–40)
Alkaline Phosphatase: 91 IU/L (ref 39–117)
BILIRUBIN TOTAL: 0.7 mg/dL (ref 0.0–1.2)
BILIRUBIN, DIRECT: 0.23 mg/dL (ref 0.00–0.40)
TOTAL PROTEIN: 6.5 g/dL (ref 6.0–8.5)

## 2016-07-27 LAB — BASIC METABOLIC PANEL
BUN/Creatinine Ratio: 18 (ref 10–24)
BUN: 29 mg/dL — AB (ref 8–27)
CALCIUM: 9 mg/dL (ref 8.6–10.2)
CO2: 20 mmol/L (ref 18–29)
Chloride: 99 mmol/L (ref 96–106)
Creatinine, Ser: 1.63 mg/dL — ABNORMAL HIGH (ref 0.76–1.27)
GFR calc non Af Amer: 39 mL/min/{1.73_m2} — ABNORMAL LOW (ref >59)
GFR, EST AFRICAN AMERICAN: 45 mL/min/{1.73_m2} — AB (ref >59)
Glucose: 140 mg/dL — ABNORMAL HIGH (ref 65–99)
POTASSIUM: 5.3 mmol/L — AB (ref 3.5–5.2)
Sodium: 138 mmol/L (ref 134–144)

## 2016-07-27 MED ORDER — RAMIPRIL 2.5 MG PO CAPS: 2.5000 mg | ORAL_CAPSULE | Freq: Every day | ORAL | 3 refills | Status: DC

## 2016-07-27 NOTE — Patient Instructions (Signed)
Return for a follow up appointment in 1 month  Check your blood pressure at home daily (if able) and keep record of the readings.  Take your BP meds as follows: STOP Entresto today (Friday)  RESTART Altace (ramipril) 2.5mg  daily on SUNDAY   Bring all of your meds, your BP cuff and your record of home blood pressures to your next appointment.  Exercise as you're able, try to walk approximately 30 minutes per day.  Keep salt intake to a minimum, especially watch canned and prepared boxed foods.  Eat more fresh fruits and vegetables and fewer canned items.  Avoid eating in fast food restaurants.    HOW TO TAKE YOUR BLOOD PRESSURE: . Rest 5 minutes before taking your blood pressure. .  Don't smoke or drink caffeinated beverages for at least 30 minutes before. . Take your blood pressure before (not after) you eat. . Sit comfortably with your back supported and both feet on the floor (don't cross your legs). . Elevate your arm to heart level on a table or a desk. . Use the proper sized cuff. It should fit smoothly and snugly around your bare upper arm. There should be enough room to slip a fingertip under the cuff. The bottom edge of the cuff should be 1 inch above the crease of the elbow. . Ideally, take 3 measurements at one sitting and record the average.

## 2016-07-27 NOTE — Progress Notes (Signed)
Patient ID: [name] [name]                 DOB: 1931-11-25                      MRN: [id number]     HPI: [name] [name] is a 83 y.o. male patient of Dr. Mayford Knife with PMH below who presents today for hypertension evaluation. At his most recent visit with Dr. Mayford Knife his Altace was stopped and he was started on Entresto 24-26mg  BID.   Patient reports being more unsteady on feet since starting Entresto. He states he was previously able to walk around his house without his [place] and now he is fearful to do this due to him feeling so unsteady. He also is very concerned about cost of the medication since it is a brand.    Cardiac Hx: Afib, systolic HF, HTN, nonischemic cardiomyopathy, LBBB, CAD, abdominal aortic aneurysm, OSA, hypothyroidism, HLD, BPH   Current HTN meds:  Torsemide 20mg  daily as needed Spironolactone 12.5mg  daily Entresto 24-26mg  BID Carvedilol 3.125mg  BID   Family History: Mother passed of enlarged heart. Brother has defibrillator and heart failure.   Social History: No tobacco.   Diet: Eats low sodium diet. Drinks 2 cups of coffee per day.   Exercise: Working on regimen from physically therapy at least 4-5 times per week.   Home BP readings:  Has home arm cuff. Most recently blood pressure has been running 100-118/57-68.   Wt Readings from Last 3 Encounters:  07/13/16 217 lb 12.8 oz (98.8 kg)  07/04/16 221 lb (100.2 kg)  05/14/16 216 lb (98 kg)   BP Readings from Last 3 Encounters:  07/27/16 (!) 102/58  07/13/16 122/64  07/04/16 118/63   Pulse Readings from Last 3 Encounters:  07/27/16 90  07/13/16 74  07/04/16 85    Renal function: Estimated Creatinine Clearance: 48.2 mL/min (by C-G formula based on SCr of 1.45 mg/dL (H)).  Past Medical History:  Diagnosis Date  . Anemia, unspecified 04/07/2013  . Atrial fibrillation (HCC)    fibrillation/flutter  . BPH (benign prostatic hypertrophy)   . Chronic systolic CHF (congestive heart failure) (HCC)    Nonischemic DCM EF 10% s/p BiV AICD  . Coronary artery disease 2008   nonobstructive ASCAD  . Depression   . Hyperlipidemia   . Hypertension   . Hypothyroidism   . Left bundle branch block   . Nonischemic cardiomyopathy (HCC)    ef 10 %  . OSA (obstructive sleep apnea)    intolerant to CPAP  . Osteoarthritis   . Sleep apnea    stopped using CPAP  . Thrombocytopenia (HCC)     Current Outpatient Prescriptions on File Prior to Visit  Medication Sig Dispense Refill  . acetaminophen (TYLENOL) 650 MG CR tablet Take 1 tablet (650 mg total) by mouth every 8 (eight) hours as needed for pain. 90 tablet 3  . AMBULATORY NON FORMULARY MEDICATION Front wheel rolling [place] for use as needed due to gait instability. Dispense 1   Fax to [phone]  R26.81 1 each 0  . benzonatate (TESSALON) 200 MG capsule Take 1 capsule (200 mg total) by mouth 3 (three) times daily as needed for cough. 45 capsule 0  . carvedilol (COREG) 3.125 MG tablet Take 1 tablet (3.125 mg total) by mouth 2 (two) times daily. 180 tablet 2  . DIGOX 125 MCG tablet Take 1 tablet by mouth  every other day 45 tablet 3  .  levothyroxine (SYNTHROID, LEVOTHROID) 137 MCG tablet TAKE 1 TABLET BY MOUTH  DAILY BEFORE BREAKFAST 90 tablet 1  . potassium chloride SA (K-DUR,KLOR-CON) 20 MEQ tablet Take 1 tablet (20 mEq total) by mouth daily as needed (with each torsemide dose). 10 tablet 10  . simvastatin (ZOCOR) 10 MG tablet Take 1 tablet by mouth  every evening 90 tablet 2  . spironolactone (ALDACTONE) 25 MG tablet Take 0.5 tablets (12.5 mg total) by mouth daily. 45 tablet 3  . torsemide (DEMADEX) 20 MG tablet Take 1 tablet (20 mg total) by mouth daily as needed (weight of 215 lbs or more). 10 tablet 10  . warfarin (COUMADIN) 1 MG tablet Take 1 tablet by mouth on Tuesday and Friday in addition to 5 mg tablet. 14 tablet 0  . warfarin (COUMADIN) 5 MG tablet Take 1 tablet by mouth once daily at 6PM 90 tablet 0   No current  facility-administered medications on file prior to visit.     No Known Allergies  Blood pressure (!) 102/58, pulse 90.   Assessment/Plan: Hypertension: BMET today. BP low today. Due to patient concerns over being increasingly dizzy on Entresto will change back to Altace 2.5mg  daily. Since there currently is no recommendation for change back to ACEi will plan for 36 hour wash out period and resume Altace. Follow up with Dr. Graciela Husbands as scheduled and hypertension thereafter.    Thank you, [name] [name]. [name] [name], PharmD  [facility]  07/27/2016 10:22 AM

## 2016-07-30 ENCOUNTER — Ambulatory Visit: Payer: Medicare Other

## 2016-07-30 ENCOUNTER — Other Ambulatory Visit: Payer: Medicare Other

## 2016-08-06 ENCOUNTER — Encounter: Payer: Self-pay | Admitting: Internal Medicine

## 2016-08-10 ENCOUNTER — Encounter: Payer: Medicare Other | Admitting: Internal Medicine

## 2016-08-16 ENCOUNTER — Ambulatory Visit (INDEPENDENT_AMBULATORY_CARE_PROVIDER_SITE_OTHER): Payer: Medicare Other | Admitting: Internal Medicine

## 2016-08-16 ENCOUNTER — Encounter: Payer: Self-pay | Admitting: Internal Medicine

## 2016-08-16 VITALS — BP 98/56 | HR 77 | Ht 76.0 in | Wt 218.2 lb

## 2016-08-16 DIAGNOSIS — I428 Other cardiomyopathies: Secondary | ICD-10-CM

## 2016-08-16 DIAGNOSIS — I480 Paroxysmal atrial fibrillation: Secondary | ICD-10-CM | POA: Diagnosis not present

## 2016-08-16 DIAGNOSIS — I5022 Chronic systolic (congestive) heart failure: Secondary | ICD-10-CM

## 2016-08-16 DIAGNOSIS — Z95810 Presence of automatic (implantable) cardiac defibrillator: Secondary | ICD-10-CM | POA: Diagnosis not present

## 2016-08-16 LAB — CUP PACEART INCLINIC DEVICE CHECK
Brady Statistic AP VP Percent: 98.46 %
Brady Statistic AP VS Percent: 0.05 %
Brady Statistic AS VP Percent: 1.46 %
Brady Statistic RA Percent Paced: 98.12 %
Brady Statistic RV Percent Paced: 99.22 %
Date Time Interrogation Session: 20180222142023
HighPow Impedance: 41 Ohm
HighPow Impedance: 52 Ohm
Implantable Lead Implant Date: 20080806
Implantable Lead Location: 753860
Implantable Lead Model: 4076
Lead Channel Impedance Value: 1121 Ohm
Lead Channel Impedance Value: 399 Ohm
Lead Channel Impedance Value: 437 Ohm
Lead Channel Pacing Threshold Amplitude: 0.625 V
Lead Channel Setting Pacing Amplitude: 2 V
Lead Channel Setting Pacing Pulse Width: 0.4 ms
Lead Channel Setting Pacing Pulse Width: 0.4 ms
MDC IDC LEAD IMPLANT DT: 20080806
MDC IDC LEAD IMPLANT DT: 20080806
MDC IDC LEAD LOCATION: 753858
MDC IDC LEAD LOCATION: 753859
MDC IDC MSMT BATTERY REMAINING LONGEVITY: 84 mo
MDC IDC MSMT BATTERY VOLTAGE: 3.04 V
MDC IDC MSMT LEADCHNL LV IMPEDANCE VALUE: 608 Ohm
MDC IDC MSMT LEADCHNL LV IMPEDANCE VALUE: 665 Ohm
MDC IDC MSMT LEADCHNL RA PACING THRESHOLD AMPLITUDE: 0.75 V
MDC IDC MSMT LEADCHNL RA PACING THRESHOLD PULSEWIDTH: 0.4 ms
MDC IDC MSMT LEADCHNL RA SENSING INTR AMPL: 1 mV
MDC IDC MSMT LEADCHNL RV IMPEDANCE VALUE: 380 Ohm
MDC IDC MSMT LEADCHNL RV PACING THRESHOLD PULSEWIDTH: 0.4 ms
MDC IDC MSMT LEADCHNL RV SENSING INTR AMPL: 10 mV
MDC IDC PG IMPLANT DT: 20171115
MDC IDC SET LEADCHNL LV PACING AMPLITUDE: 2.5 V
MDC IDC SET LEADCHNL RV PACING AMPLITUDE: 2 V
MDC IDC SET LEADCHNL RV SENSING SENSITIVITY: 0.3 mV
MDC IDC STAT BRADY AS VS PERCENT: 0.04 %

## 2016-08-16 LAB — BASIC METABOLIC PANEL
BUN / CREAT RATIO: 16 (ref 10–24)
BUN: 26 mg/dL (ref 8–27)
CALCIUM: 9.1 mg/dL (ref 8.6–10.2)
CO2: 26 mmol/L (ref 18–29)
Chloride: 99 mmol/L (ref 96–106)
Creatinine, Ser: 1.61 mg/dL — ABNORMAL HIGH (ref 0.76–1.27)
GFR calc Af Amer: 45 — ABNORMAL LOW (ref >59)
GFR calc non Af Amer: 39 — ABNORMAL LOW (ref >59)
Glucose: 128 mg/dL — ABNORMAL HIGH (ref 65–99)
Potassium: 4.5 mmol/L (ref 3.5–5.2)
Sodium: 139 mmol/L (ref 134–144)

## 2016-08-16 NOTE — Progress Notes (Signed)
Patient Care Team: Rodolph Bong, MD as PCP - General (Family Medicine) Quintella Reichert, MD as Consulting Physician (Cardiology)   HPI  [name] [name] is a 83 y.o. male Seen in followup for an ICD implanted for primary prevention in the setting of nonischemic cardiomyopathy identified and confirmed by catheterization in 2008. Because of symptoms of heart failure and left bundle branch block he underwent CRT-D implantation with a Medtronic device and a 6947 defibrillator lead. He had a lazerus-like effect.   He also has a history of atrial fibrillation For this he was treated with amiodarone. 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 He struggles with borderline  Past Medical History:  Diagnosis Date  . Anemia, unspecified 04/07/2013  . Atrial fibrillation (HCC)    fibrillation/flutter  . BPH (benign prostatic hypertrophy)   . Chronic systolic CHF (congestive heart failure) (HCC)    Nonischemic DCM EF 10% s/p BiV AICD  . Coronary artery disease 2008   nonobstructive ASCAD  . Depression   . Hyperlipidemia   . Hypertension   . Hypothyroidism   . Left bundle branch block   . Nonischemic cardiomyopathy (HCC)    ef 10 %  . OSA (obstructive sleep apnea)    intolerant to CPAP  . Osteoarthritis   . Sleep apnea    stopped using CPAP  . Thrombocytopenia (HCC)     Past Surgical History:  Procedure Laterality Date  . CARDIAC DEFIBRILLATOR PLACEMENT     left chest  . CARDIOVERSION N/A 12/29/2012   Procedure: CARDIOVERSION;  Surgeon: Quintella Reichert, MD;  Location: [facility];  Service: Cardiovascular;  Laterality: N/A;  . CHOLECYSTECTOMY    . Colonscopy     reportedly  negative per pt; around 2005.    . EP IMPLANTABLE DEVICE N/A 05/09/2016   Procedure: BIVI ICD Generator Changeout;  Surgeon: Duke Salvia, MD;  Location: [facility] INVASIVE CV LAB;  Service: Cardiovascular;  Laterality: N/A;  . TRANSURETHRAL RESECTION OF PROSTATE     for BPH    Current Outpatient Prescriptions  Medication Sig Dispense Refill  . acetaminophen (TYLENOL) 650 MG CR tablet Take 1 tablet (650 mg total) by mouth every 8 (eight) hours as needed for pain. 90 tablet 3  . AMBULATORY NON FORMULARY MEDICATION Front wheel rolling [place] for use as needed due to gait instability. Dispense 1   Fax to [phone]  R26.81 1 each 0  . carvedilol (COREG) 3.125 MG tablet Take 1 tablet (3.125 mg total) by mouth 2 (two) times daily. 180 tablet 2  . DIGOX 125 MCG tablet Take 1 tablet by mouth  every other day 45 tablet 3  . levothyroxine (SYNTHROID, LEVOTHROID) 137 MCG tablet TAKE 1 TABLET BY MOUTH  DAILY BEFORE BREAKFAST 90 tablet 1  . potassium chloride SA (K-DUR,KLOR-CON) 20 MEQ tablet Take 1 tablet (20 mEq total) by mouth daily as needed (with each torsemide dose). 10 tablet 10  . ramipril (ALTACE) 2.5 MG capsule Take 1 capsule (2.5  mg total) by mouth daily. 90 capsule 3  . simvastatin (ZOCOR) 10 MG tablet Take 1 tablet by mouth  every evening 90 tablet 2  . spironolactone (ALDACTONE) 25 MG tablet Take 0.5 tablets (12.5 mg total) by mouth daily. 45 tablet 3  . torsemide (DEMADEX) 20 MG tablet Take 1 tablet (20 mg total) by mouth daily as needed (weight of 215 lbs or more). 10 tablet 10  . warfarin (COUMADIN) 1 MG tablet Take 1 tablet by mouth on Tuesday and Friday in addition to 5 mg tablet. 14 tablet 0  . warfarin (COUMADIN) 5 MG tablet Take 1 tablet by mouth once daily at 6PM 90 tablet 0   No current facility-administered medications for this visit.     No Known Allergies  Review of Systems negative except from HPI and PMH  Physical Exam BP (!) 98/56   Pulse 77   Ht 6\' 4"  (1.93 m)    Wt 218 lb 3.2 oz (99 kg)   SpO2 97%   BMI 26.56 kg/m  Well developed and well nourished in no acute distress HENT normal E scleral and icterus clear Neck Supple JVP flat; carotids brisk and full Clear to ausculation  Regular rate and rhythm, no murmurs gallops or rub Soft with active bowel sounds No clubbing cyanosis noEdema Alert and oriented, grossly normal motor and sensory function Skin Warm and Dry  ECG AV pacing   Assessment and  Plan  Nonischemic cardiomyopathy   Congestive heart failure-chronic-systolic   Atrial fibrillation-paroxysmal  Monitoring for high-risk medications-   Hyperkalemia  new  Hypotension  Implantable defibrillator-CRT-Medtronic  Asymptomatic with low BP     We will recheck Potassium  Euvolemic continue current meds  No intercurrent atrial fibrillation or flutter  On Anticoagulation;  No bleeding issues

## 2016-08-16 NOTE — Patient Instructions (Signed)
Medication Instructions: - Your physician recommends that you continue on your current medications as directed. Please refer to the Current Medication list given to you today.  Labwork: - Your physician recommends that you have lab work today: [employer]  Procedures/Testing: - none ordered  Follow-Up: - Remote monitoring is used to monitor your Pacemaker of ICD from home. This monitoring reduces the number of office visits required to check your device to one time per year. It allows [place] to keep an eye on the functioning of your device to ensure it is working properly. You are scheduled for a device check from home on 11/15/16. You may send your transmission at any time that day. If you have a wireless device, the transmission will be sent automatically. After your physician reviews your transmission, you will receive a postcard with your next transmission date.  - Your physician wants you to follow-up in: 1 year with Dr. Graciela Husbands. You will receive a reminder letter in the mail two months in advance. If you don't receive a letter, please call our office to schedule the follow-up appointment.   Any Additional Special Instructions Will Be Listed Below (If Applicable).     If you need a refill on your cardiac medications before your next appointment, please call your pharmacy.

## 2016-08-17 ENCOUNTER — Telehealth: Payer: Self-pay | Admitting: Internal Medicine

## 2016-08-17 NOTE — Telephone Encounter (Signed)
New message     Pt is returning call to [facility] about labs

## 2016-08-17 NOTE — Telephone Encounter (Signed)
The patient is aware of his results. 

## 2016-09-04 ENCOUNTER — Other Ambulatory Visit: Payer: Self-pay | Admitting: Family Medicine

## 2016-09-06 DIAGNOSIS — H2513 Age-related nuclear cataract, bilateral: Secondary | ICD-10-CM | POA: Diagnosis not present

## 2016-09-19 DIAGNOSIS — H02403 Unspecified ptosis of bilateral eyelids: Secondary | ICD-10-CM | POA: Diagnosis not present

## 2016-09-19 DIAGNOSIS — Z961 Presence of intraocular lens: Secondary | ICD-10-CM | POA: Diagnosis not present

## 2016-09-19 DIAGNOSIS — H2511 Age-related nuclear cataract, right eye: Secondary | ICD-10-CM | POA: Diagnosis not present

## 2016-09-26 ENCOUNTER — Ambulatory Visit (INDEPENDENT_AMBULATORY_CARE_PROVIDER_SITE_OTHER): Payer: Medicare Other | Admitting: Family Medicine

## 2016-09-26 DIAGNOSIS — M1712 Unilateral primary osteoarthritis, left knee: Secondary | ICD-10-CM | POA: Diagnosis not present

## 2016-09-26 DIAGNOSIS — I4819 Other persistent atrial fibrillation: Secondary | ICD-10-CM

## 2016-09-26 DIAGNOSIS — Z7901 Long term (current) use of anticoagulants: Secondary | ICD-10-CM

## 2016-09-26 DIAGNOSIS — G8929 Other chronic pain: Secondary | ICD-10-CM

## 2016-09-26 DIAGNOSIS — M25562 Pain in left knee: Secondary | ICD-10-CM | POA: Diagnosis not present

## 2016-09-26 DIAGNOSIS — I481 Persistent atrial fibrillation: Secondary | ICD-10-CM | POA: Diagnosis not present

## 2016-09-26 LAB — POCT INR: INR: 1.9

## 2016-09-26 NOTE — Patient Instructions (Signed)
Thank you for coming in today. Call or go to the ER if you develop a large red swollen joint with extreme pain or oozing puss.  Do not change warfarin. Return in 1 month for a doctor visit.  We will recheck INR then.

## 2016-09-26 NOTE — Progress Notes (Signed)
[name] [name] is a 83 y.o. male who presents to [name]: Primary Care Sports Medicine today for follow-up left knee pain and discuss atrial fibrillation.  Left knee pain: Patient notes worsening left knee pain. He has a history of end-stage DJD of the knees bilaterally. He's been walking a lot more recently and notes worsening left knee pain. He denies any injury. He's been trying some over-the-counter medicines for pain control which have not helped much.  Atrial fibrillation: Patient has heart failure and atrial fibrillation and is anticoagulated with warfarin. It has been sometime since his last lab was checked.   Past Medical History:  Diagnosis Date  . Anemia, unspecified 04/07/2013  . Atrial fibrillation (HCC)    fibrillation/flutter  . BPH (benign prostatic hypertrophy)   . Chronic systolic CHF (congestive heart failure) (HCC)    Nonischemic DCM EF 10% s/p BiV AICD  . Coronary artery disease 2008   nonobstructive ASCAD  . Depression   . Hyperlipidemia   . Hypertension   . Hypothyroidism   . Left bundle branch block   . Nonischemic cardiomyopathy (HCC)    ef 10 %  . OSA (obstructive sleep apnea)    intolerant to CPAP  . Osteoarthritis   . Sleep apnea    stopped using CPAP  . Thrombocytopenia (HCC)    Past Surgical History:  Procedure Laterality Date  . CARDIAC DEFIBRILLATOR PLACEMENT     left chest  . CARDIOVERSION N/A 12/29/2012   Procedure: CARDIOVERSION;  Surgeon: Quintella Reichert, MD;  Location: [facility];  Service: Cardiovascular;  Laterality: N/A;  . CHOLECYSTECTOMY    . Colonscopy     reportedly negative per pt; around 2005.    . EP IMPLANTABLE DEVICE N/A 05/09/2016   Procedure: BIVI ICD Generator Changeout;  Surgeon: Duke Salvia, MD;  Location: [facility] INVASIVE CV LAB;  Service: Cardiovascular;  Laterality: N/A;  . TRANSURETHRAL RESECTION OF PROSTATE     for BPH     Social History  Substance Use Topics  . Smoking status: Never Smoker  . Smokeless tobacco: Never Used  . Alcohol use No   family history includes Cancer in his maternal uncle; Heart disease in his father; Heart failure in his mother; Stroke in his mother.  ROS as above:  Medications: Current Outpatient Prescriptions  Medication Sig Dispense Refill  . acetaminophen (TYLENOL) 650 MG CR tablet Take 1 tablet (650 mg total) by mouth every 8 (eight) hours as needed for pain. 90 tablet 3  . AMBULATORY NON FORMULARY MEDICATION Front wheel rolling [place] for use as needed due to gait instability. Dispense 1   Fax to [phone]  R26.81 1 each 0  . carvedilol (COREG) 3.125 MG tablet Take 1 tablet (3.125 mg total) by mouth 2 (two) times daily. 180 tablet 2  . DIGOX 125 MCG tablet Take 1 tablet by mouth  every other day 45 tablet 3  . levothyroxine (SYNTHROID, LEVOTHROID) 137 MCG tablet TAKE 1 TABLET BY MOUTH  DAILY BEFORE BREAKFAST 90 tablet 1  . potassium chloride SA (K-DUR,KLOR-CON) 20 MEQ tablet Take 1 tablet (20 mEq total) by mouth daily as needed (with each torsemide dose). 10 tablet 10  . ramipril (ALTACE) 2.5 MG capsule Take 1 capsule (2.5 mg total) by mouth daily. 90 capsule 3  . simvastatin (ZOCOR) 10 MG tablet Take 1 tablet by mouth  every evening 90 tablet 2  . spironolactone (ALDACTONE) 25 MG tablet Take 0.5 tablets (12.5  mg total) by mouth daily. 45 tablet 3  . torsemide (DEMADEX) 20 MG tablet Take 1 tablet (20 mg total) by mouth daily as needed (weight of 215 lbs or more). 10 tablet 10  . warfarin (COUMADIN) 1 MG tablet Take 1 tablet by mouth on Tuesday and Friday in addition to 5 mg tablet. 14 tablet 0  . warfarin (COUMADIN) 1 MG tablet TAKE 1 TABLET BY MOUTH ON  TUESDAYS AND FRIDAYS ONLY  (TAKE WITH 5MG ) 26 tablet 0  . warfarin (COUMADIN) 5 MG tablet Take 1 tablet by mouth once daily at 6PM 90 tablet 0   No current facility-administered medications for this visit.     Allergies  Allergen Reactions  . Amiodarone Other (See Comments)    Neuro toxicity    Health Maintenance Health Maintenance  Topic Date Due  . PNA vac Low Risk Adult (1 of 2 - PCV13) 11/28/2016 (Originally 05/29/1999)  . INFLUENZA VACCINE  01/23/2017  . TETANUS/TDAP  07/01/2019     Exam:  There were no vitals taken for this visit. Gen: Well NAD HEENT: EOMI,  MMM Lungs: Normal work of breathing. CTABL Heart: RRR no MRG Abd: NABS, Soft. Nondistended, Nontender Exts: Brisk capillary refill, warm and well perfused.  MSK left knee moderate effusion neck sling range of motion 5-100 with retropatellar crepitations. Nontender. Stable ligamentous exam.  Patient ambulates with a Butikofer.  Procedure: Real-time Ultrasound Guided Injection of left knee  Device: GE Logiq E  Images permanently stored and available for review in the ultrasound unit. Verbal informed consent obtained. Discussed risks and benefits of procedure. Warned about infection bleeding damage to structures skin hypopigmentation and fat atrophy among others. Patient expresses understanding and agreement Time-out conducted.  Noted no overlying erythema, induration, or other signs of local infection.  Skin prepped in a sterile fashion.  Local anesthesia: Topical Ethyl chloride.  With sterile technique and under real time ultrasound guidance: 80mg  kenalog and 34ml marcaine injected easily.  Completed without difficulty  Pain immediately resolved suggesting accurate placement of the medication.  Advised to call if fevers/chills, erythema, induration, drainage, or persistent bleeding.  Images permanently stored and available for review in the ultrasound unit.  Impression: Technically successful ultrasound guided injection.     No results found for this or any previous visit (from the past 72 hour(s)). No results found.    Assessment and Plan: 83 y.o. male with  Left knee pain: S/P injection today.  Recheck in one month.  Anticoagulation: INR 1.9 today. This is very close to goal. Dow has had a history of falls and is higher risk for bleeding. I plan on erring on a low end of INR. He has been low several times now.  We'll recheck in a month.  No changes today. If still a month we'll increase warfarin doses   No orders of the defined types were placed in this encounter.  No orders of the defined types were placed in this encounter.    Discussed warning signs or symptoms. Please see discharge instructions. Patient expresses understanding.

## 2016-09-26 NOTE — Addendum Note (Signed)
Addended by: Minna Antis T on: 09/26/2016 05:13 PM   Modules accepted: Orders

## 2016-10-11 DIAGNOSIS — H2513 Age-related nuclear cataract, bilateral: Secondary | ICD-10-CM | POA: Diagnosis not present

## 2016-10-11 DIAGNOSIS — H2511 Age-related nuclear cataract, right eye: Secondary | ICD-10-CM | POA: Diagnosis not present

## 2016-10-29 ENCOUNTER — Ambulatory Visit (INDEPENDENT_AMBULATORY_CARE_PROVIDER_SITE_OTHER): Payer: Medicare Other | Admitting: Family Medicine

## 2016-10-29 ENCOUNTER — Telehealth: Payer: Self-pay | Admitting: Internal Medicine

## 2016-10-29 VITALS — BP 114/69 | HR 74 | Temp 97.5°F | Wt 209.0 lb

## 2016-10-29 DIAGNOSIS — R0609 Other forms of dyspnea: Secondary | ICD-10-CM | POA: Diagnosis not present

## 2016-10-29 DIAGNOSIS — I5022 Chronic systolic (congestive) heart failure: Secondary | ICD-10-CM

## 2016-10-29 DIAGNOSIS — N183 Chronic kidney disease, stage 3 unspecified: Secondary | ICD-10-CM

## 2016-10-29 DIAGNOSIS — M1712 Unilateral primary osteoarthritis, left knee: Secondary | ICD-10-CM

## 2016-10-29 DIAGNOSIS — M170 Bilateral primary osteoarthritis of knee: Secondary | ICD-10-CM

## 2016-10-29 DIAGNOSIS — E039 Hypothyroidism, unspecified: Secondary | ICD-10-CM

## 2016-10-29 DIAGNOSIS — N2889 Other specified disorders of kidney and ureter: Secondary | ICD-10-CM

## 2016-10-29 DIAGNOSIS — Z95810 Presence of automatic (implantable) cardiac defibrillator: Secondary | ICD-10-CM

## 2016-10-29 DIAGNOSIS — I428 Other cardiomyopathies: Secondary | ICD-10-CM

## 2016-10-29 DIAGNOSIS — Z7901 Long term (current) use of anticoagulants: Secondary | ICD-10-CM

## 2016-10-29 LAB — POCT INR: INR: 2.3

## 2016-10-29 NOTE — Telephone Encounter (Signed)
New Message     Pt missed call from Arnold

## 2016-10-29 NOTE — Progress Notes (Signed)
Pt stated that he has no energy, and feels weak all the time.

## 2016-10-29 NOTE — Telephone Encounter (Signed)
Spoke with pt, and let him know that his device is functioning normally and with exceptions of 2 episodes of atrial fibrillation, that lasted about 5 min each, there were no other episodes. Pt stated that his BP this morning was normal at 120/63. Pt stated that he had an appointment with Dr. Denyse Amass this afternoon, advised pt to keep this appointment. Pt voiced understanding

## 2016-10-29 NOTE — Telephone Encounter (Signed)
New Message   pt verbalized that he is calling for rn   Because he is very weak he said he will send a electronic transmit in today   And wait for rn to call him back  He stated when asked that he has no SOB and no Chest pain

## 2016-10-29 NOTE — Patient Instructions (Signed)
Thank you for coming in today. You should hear about the ECHO soon.  Recheck in 1 month.   Call or go to the emergency room if you get worse, have trouble breathing, have chest pains, or palpitations.    Fatigue Fatigue is feeling tired all of the time, a lack of energy, or a lack of motivation. Occasional or mild fatigue is often a normal response to activity or life in general. However, long-lasting (chronic) or extreme fatigue may indicate an underlying medical condition. Follow these instructions at home: Watch your fatigue for any changes. The following actions may help to lessen any discomfort you are feeling:  Talk to your health care provider about how much sleep you need each night. Try to get the required amount every night.  Take medicines only as directed by your health care provider.  Eat a healthy and nutritious diet. Ask your health care provider if you need help changing your diet.  Drink enough fluid to keep your urine clear or pale yellow.  Practice ways of relaxing, such as yoga, meditation, massage therapy, or acupuncture.  Exercise regularly.  Change situations that cause you stress. Try to keep your work and personal routine reasonable.  Do not abuse illegal drugs.  Limit alcohol intake to no more than 1 drink per day for nonpregnant women and 2 drinks per day for men. One drink equals 12 ounces of beer, 5 ounces of wine, or 1 ounces of hard liquor.  Take a multivitamin, if directed by your health care provider. Contact a health care provider if:  Your fatigue does not get better.  You have a fever.  You have unintentional weight loss or gain.  You have headaches.  You have difficulty:  Falling asleep.  Sleeping throughout the night.  You feel angry, guilty, anxious, or sad.  You are unable to have a bowel movement (constipation).  You skin is dry.  Your legs or another part of your body is swollen. Get help right away if:  You feel  confused.  Your vision is blurry.  You feel faint or pass out.  You have a severe headache.  You have severe abdominal, pelvic, or back pain.  You have chest pain, shortness of breath, or an irregular or fast heartbeat.  You are unable to urinate or you urinate less than normal.  You develop abnormal bleeding, such as bleeding from the rectum, vagina, nose, lungs, or nipples.  You vomit blood.  You have thoughts about harming yourself or committing suicide.  You are worried that you might harm someone else. This information is not intended to replace advice given to you by your health care provider. Make sure you discuss any questions you have with your health care provider. Document Released: 04/08/2007 Document Revised: 11/17/2015 Document Reviewed: 10/13/2013 Elsevier Interactive Patient Education  2017 [employer].

## 2016-10-30 LAB — COMPLETE METABOLIC PANEL WITH GFR
ALK PHOS: 78 U/L (ref 40–115)
ALT: 11 U/L (ref 9–46)
AST: 17 U/L (ref 10–35)
Albumin: 4.2 g/dL (ref 3.6–5.1)
BILIRUBIN TOTAL: 0.7 mg/dL (ref 0.2–1.2)
BUN: 25 mg/dL (ref 7–25)
CALCIUM: 9 mg/dL (ref 8.6–10.3)
CHLORIDE: 104 mmol/L (ref 98–110)
CO2: 26 mmol/L (ref 20–31)
CREATININE: 1.72 mg/dL — AB (ref 0.70–1.11)
GFR, EST AFRICAN AMERICAN: 42 mL/min — AB (ref >=60)
GFR, Est Non African American: 36 mL/min — ABNORMAL LOW (ref >=60)
Glucose, Bld: 115 mg/dL — ABNORMAL HIGH (ref 65–99)
POTASSIUM: 4.2 mmol/L (ref 3.5–5.3)
Sodium: 141 mmol/L (ref 135–146)
Total Protein: 6.4 g/dL (ref 6.1–8.1)

## 2016-10-30 LAB — CBC
HCT: 40 % (ref 38.5–50.0)
Hemoglobin: 12.9 g/dL — ABNORMAL LOW (ref 13.2–17.1)
MCH: 29.6 pg (ref 27.0–33.0)
MCHC: 32.3 g/dL (ref 32.0–36.0)
MCV: 91.7 fL (ref 80.0–100.0)
MPV: 10.2 fL (ref 7.5–12.5)
PLATELETS: 145 10*3/uL (ref 140–400)
RBC: 4.36 MIL/uL (ref 4.20–5.80)
RDW: 14 % (ref 11.0–15.0)
WBC: 6.1 10*3/uL (ref 3.8–10.8)

## 2016-10-30 LAB — BRAIN NATRIURETIC PEPTIDE: BRAIN NATRIURETIC PEPTIDE: 273.9 pg/mL — AB (ref <100)

## 2016-10-30 LAB — TSH: TSH: 1.06 m[IU]/L (ref 0.40–4.50)

## 2016-10-30 NOTE — Progress Notes (Signed)
[name] [name] is a 83 y.o. male who presents to [facility] [name] [name]: Primary Care Sports Medicine today for fatigue recheck warfarin level and discuss left knee pain.  [name] [name] notes profound worsening fatigue. He is a pertinent medical history is significant for systolic heart failure with atrial fibrillation. He has BiV AICD that helps to maintain his cardiac output. He notes previously he was able to do normal walking with his [name] [name] without becoming profoundly fatigued. He notes over the past several months he is unable to even go to his mailbox without becoming very tired. Has any worsening leg swelling. He is a low-sodium diet and has not changed his medications.  Left knee pain: Patient has recurrent left knee pain attributable to DJD. He's had several injections in the past and would like a repeat injection today if possible as it's worked well previously. He denies any new injury.   Past Medical History:  Diagnosis Date  . Anemia, unspecified 04/07/2013  . Atrial fibrillation (HCC)    fibrillation/flutter  . BPH (benign prostatic hypertrophy)   . Chronic systolic CHF (congestive heart failure) (HCC)    Nonischemic DCM EF 10% s/p BiV AICD  . Coronary artery disease 2008   nonobstructive ASCAD  . Depression   . Hyperlipidemia   . Hypertension   . Hypothyroidism   . Left bundle branch block   . Nonischemic cardiomyopathy (HCC)    ef 10 %  . OSA (obstructive sleep apnea)    intolerant to CPAP  . Osteoarthritis   . Sleep apnea    stopped using CPAP  . Thrombocytopenia (HCC)    Past Surgical History:  Procedure Laterality Date  . CARDIAC DEFIBRILLATOR PLACEMENT     left chest  . CARDIOVERSION N/A 12/29/2012   Procedure: CARDIOVERSION;  Surgeon: Quintella Reichert, MD;  [facility];  Service: Cardiovascular;  Laterality: N/A;  . CHOLECYSTECTOMY    . Colonscopy     reportedly  negative per pt; around 2005.    . EP IMPLANTABLE DEVICE N/A 05/09/2016   Procedure: BIVI ICD Generator Changeout;  Surgeon: Duke Salvia, MD;  Location: [facility] INVASIVE CV LAB;  Service: Cardiovascular;  Laterality: N/A;  . TRANSURETHRAL RESECTION OF PROSTATE     for BPH   Social History  Substance Use Topics  . Smoking status: Never Smoker  . Smokeless tobacco: Never Used  . Alcohol use No   family history includes Cancer in his maternal uncle; Heart disease in his father; Heart failure in his mother; Stroke in his mother.  ROS as above:  Medications: Current Outpatient Prescriptions  Medication Sig Dispense Refill  . acetaminophen (TYLENOL) 650 MG CR tablet Take 1 tablet (650 mg total) by mouth every 8 (eight) hours as needed for pain. 90 tablet 3  . AMBULATORY NON FORMULARY MEDICATION Front wheel rolling [place] for use as needed due to gait instability. Dispense 1   Fax to [phone]  R26.81 1 each 0  . carvedilol (COREG) 3.125 MG tablet Take 1 tablet (3.125 mg total) by mouth 2 (two) times daily. 180 tablet 2  . DIGOX 125 MCG tablet Take 1 tablet by mouth  every other day 45 tablet 3  . levothyroxine (SYNTHROID, LEVOTHROID) 137 MCG tablet TAKE 1 TABLET BY MOUTH  DAILY BEFORE BREAKFAST 90 tablet 1  . potassium chloride SA (K-DUR,KLOR-CON) 20 MEQ tablet Take 1 tablet (20 mEq total) by mouth daily as needed (with each torsemide dose). 10 tablet 10  .  ramipril (ALTACE) 2.5 MG capsule Take 1 capsule (2.5 mg total) by mouth daily. 90 capsule 3  . simvastatin (ZOCOR) 10 MG tablet Take 1 tablet by mouth  every evening 90 tablet 2  . spironolactone (ALDACTONE) 25 MG tablet Take 0.5 tablets (12.5 mg total) by mouth daily. 45 tablet 3  . torsemide (DEMADEX) 20 MG tablet Take 1 tablet (20 mg total) by mouth daily as needed (weight of 215 lbs or more). 10 tablet 10  . warfarin (COUMADIN) 1 MG tablet Take 1 tablet by mouth on Tuesday and Friday in addition to 5 mg tablet. 14 tablet 0  .  warfarin (COUMADIN) 1 MG tablet TAKE 1 TABLET BY MOUTH ON  TUESDAYS AND FRIDAYS ONLY  (TAKE WITH 5MG ) 26 tablet 0  . warfarin (COUMADIN) 5 MG tablet Take 1 tablet by mouth once daily at 6PM 90 tablet 0   No current facility-administered medications for this visit.    Allergies  Allergen Reactions  . Amiodarone Other (See Comments)    Neuro toxicity    Health Maintenance Health Maintenance  Topic Date Due  . PNA vac Low Risk Adult (1 of 2 - PCV13) 11/28/2016 (Originally 05/29/1999)  . INFLUENZA VACCINE  01/23/2017  . TETANUS/TDAP  07/01/2019     Exam:  BP 114/69 (BP Location: Left Arm, Patient Position: Sitting, Cuff Size: Normal)   Pulse 74   Temp 97.5 F (36.4 C) (Oral)   Wt 209 lb (94.8 kg)   SpO2 95%   BMI 25.44 kg/m  Gen: Well NAD HEENT: EOMI,  MMM Lungs: Normal work of breathing. CTABL Heart: RRR no MRG Abd: NABS, Soft. Nondistended, Nontender Exts: Brisk capillary refill, warm and well perfused. No significant edema Left knee moderate effusion nontender crepitations on exam.  Procedure: Real-time Ultrasound Guided Injection of left knee  Device: GE Logiq E  Images permanently stored and available for review in the ultrasound unit. Verbal informed consent obtained. Discussed risks and benefits of procedure. Warned about infection bleeding damage to structures skin hypopigmentation and fat atrophy among others. Patient expresses understanding and agreement Time-out conducted.  Noted no overlying erythema, induration, or other signs of local infection.  Skin prepped in a sterile fashion.  Local anesthesia: Topical Ethyl chloride.  With sterile technique and under real time ultrasound guidance: 80mg  kenalog and 4ml marcaine injected easily.  Completed without difficulty  Pain immediately resolved suggesting accurate placement of the medication.  Advised to call if fevers/chills, erythema, induration, drainage, or persistent bleeding.  Images permanently  stored and available for review in the ultrasound unit.  Impression: Technically successful ultrasound guided injection.    Lab Results  Component Value Date   INR 2.3 10/29/2016   INR 1.9 09/26/2016   INR 1.7 (H) 07/04/2016      Assessment and Plan: 83 y.o. male with  Fatigue: Unclear etiology concerning for cardiac issue as patient has profound heart failure previously. Plan for repeat echocardiogram as well as limited laboratory workup listed below. Recheck in 1 month or return sooner if needed  Knee pain: DJD. Treated with injection today. Recheck in a month.  INR: At goal continue current regimen   Orders Placed This Encounter  Procedures  . CBC  . COMPLETE METABOLIC PANEL WITH GFR  . TSH  . B Nat Peptide  . POCT INR  . ECHOCARDIOGRAM COMPLETE    Standing Status:   Future    Standing Expiration Date:   01/29/2018    Order Specific Question:   Where  should this test be performed    Answer:   [facility]    Order Specific Question:   Perflutren DEFINITY (image enhancing agent) should be administered unless hypersensitivity or allergy exist    Answer:   Administer Perflutren    Order Specific Question:   Expected Date:    Answer:   1 week   No orders of the defined types were placed in this encounter.    Discussed warning signs or symptoms. Please see discharge instructions. Patient expresses understanding.

## 2016-10-31 ENCOUNTER — Ambulatory Visit (HOSPITAL_BASED_OUTPATIENT_CLINIC_OR_DEPARTMENT_OTHER)
Admission: RE | Admit: 2016-10-31 | Discharge: 2016-10-31 | Disposition: A | Discharge status: Home / Self Care | Payer: Medicare Other | Source: Ambulatory Visit | Attending: Family Medicine | Admitting: Family Medicine

## 2016-10-31 DIAGNOSIS — I420 Dilated cardiomyopathy: Secondary | ICD-10-CM | POA: Diagnosis not present

## 2016-10-31 DIAGNOSIS — I5022 Chronic systolic (congestive) heart failure: Secondary | ICD-10-CM | POA: Diagnosis not present

## 2016-10-31 DIAGNOSIS — N183 Chronic kidney disease, stage 3 unspecified: Secondary | ICD-10-CM

## 2016-10-31 DIAGNOSIS — I340 Nonrheumatic mitral (valve) insufficiency: Secondary | ICD-10-CM | POA: Diagnosis not present

## 2016-10-31 DIAGNOSIS — I351 Nonrheumatic aortic (valve) insufficiency: Secondary | ICD-10-CM | POA: Diagnosis not present

## 2016-10-31 DIAGNOSIS — I361 Nonrheumatic tricuspid (valve) insufficiency: Secondary | ICD-10-CM | POA: Diagnosis not present

## 2016-10-31 DIAGNOSIS — I428 Other cardiomyopathies: Secondary | ICD-10-CM | POA: Diagnosis not present

## 2016-10-31 NOTE — Progress Notes (Signed)
  Echocardiogram 2D Echocardiogram has been performed.  Arvil Chaco 10/31/2016, 2:23 PM

## 2016-11-04 NOTE — Progress Notes (Signed)
Cardiology Office Note    Date:  11/05/2016   ID:  [name] [name], DOB 1932/01/16, MRN [id number]  PCP:  Rodolph Bong, MD  Cardiologist:  Armanda Magic, MD   Chief Complaint  Patient presents with  . Coronary Artery Disease  . Cardiomyopathy  . Congestive Heart Failure  . Hypertension  . Atrial Fibrillation    History of Present Illness:  [name] [name] is a 83 y.o. male with a history of nonobstructive ASCAD, NICM by cath 2008 (EF 35% by echo 01/2016 and 35-40% by echo 10/31/2016) with chronic CHF s/p Medtronic CRT-D, LBBB, CRI (baseline 1.7-1.8), HTN, PAF, OSA (intolerant to CPAP) and dyslipidemia who presents for followup. He is followed in Advanced CHF clinic for NYHA class II-III CHF.   He is here today for followup and has not been feeling well.  He denies any chest pain or pressure. He has been having DOE with any physical exertion including bathing himself.  He says that he barely was able to walk to the office today and had to get into a wheelchair.  He has been having problems with significant exertional fatigue and his PCP ordered another echo which showed no change in his EF at 35-40%.  He denies anyLE edema, palpitations or syncope. He sleeps well and denies any PND or orthopnea despite being SOB during the day.  He thinks that his dizziness has gotten worse when going from sitting to standing after getting out of bed. He was taken off Amio by Dr. Graciela Husbands due to problems with balance and felt better initially but now feels worse with balance problems.  He says that he gets severely fatigue with doing any type of activity.A recent BNP was elevated at 259.  His diuretic was not changed. His last dig level was ok in 01/2016.      Past Medical History:  Diagnosis Date  . Anemia, unspecified 04/07/2013  . Atrial fibrillation (HCC)    fibrillation/flutter  . BPH (benign prostatic hypertrophy)   . Chronic systolic CHF (congestive heart failure) (HCC)    Nonischemic DCM EF 10%  s/p BiV AICD  . Coronary artery disease 2008   nonobstructive ASCAD  . Depression   . Hyperlipidemia   . Hypertension   . Hypothyroidism   . Left bundle branch block   . Nonischemic cardiomyopathy (HCC)    ef 10 %  . OSA (obstructive sleep apnea)    intolerant to CPAP  . Osteoarthritis   . Sleep apnea    stopped using CPAP  . Thrombocytopenia (HCC)     Past Surgical History:  Procedure Laterality Date  . CARDIAC DEFIBRILLATOR PLACEMENT     left chest  . CARDIOVERSION N/A 12/29/2012   Procedure: CARDIOVERSION;  Surgeon: Quintella Reichert, MD;  Location: [facility];  Service: Cardiovascular;  Laterality: N/A;  . CHOLECYSTECTOMY    . Colonscopy     reportedly negative per pt; around 2005.    . EP IMPLANTABLE DEVICE N/A 05/09/2016   Procedure: BIVI ICD Generator Changeout;  Surgeon: Duke Salvia, MD;  Location: [facility] INVASIVE CV LAB;  Service: Cardiovascular;  Laterality: N/A;  . TRANSURETHRAL RESECTION OF PROSTATE     for BPH    Current Medications: Current Meds  Medication Sig  . acetaminophen (TYLENOL) 650 MG CR tablet Take 1 tablet (650 mg total) by mouth every 8 (eight) hours as needed for pain.  Marland Kitchen AMBULATORY NON FORMULARY MEDICATION Front wheel rolling [place] for use as needed due to  gait instability. Dispense 1   Fax to [phone]  R26.81  . carvedilol (COREG) 3.125 MG tablet Take 1 tablet (3.125 mg total) by mouth 2 (two) times daily.  Marland Kitchen DIGOX 125 MCG tablet Take 1 tablet by mouth  every other day  . levothyroxine (SYNTHROID, LEVOTHROID) 137 MCG tablet TAKE 1 TABLET BY MOUTH  DAILY BEFORE BREAKFAST  . potassium chloride SA (K-DUR,KLOR-CON) 20 MEQ tablet Take 1 tablet (20 mEq total) by mouth daily as needed (with each torsemide dose).  . ramipril (ALTACE) 2.5 MG capsule Take 1 capsule (2.5 mg total) by mouth daily.  . simvastatin (ZOCOR) 10 MG tablet Take 1 tablet by mouth  every evening  . spironolactone (ALDACTONE) 25 MG tablet Take 0.5 tablets (12.5 mg total) by  mouth daily.  Marland Kitchen torsemide (DEMADEX) 20 MG tablet Take 1 tablet (20 mg total) by mouth daily.  Marland Kitchen warfarin (COUMADIN) 1 MG tablet Take 1 tablet by mouth on Tuesday and Friday in addition to 5 mg tablet.  . warfarin (COUMADIN) 1 MG tablet TAKE 1 TABLET BY MOUTH ON  TUESDAYS AND FRIDAYS ONLY  (TAKE WITH 5MG )  . warfarin (COUMADIN) 5 MG tablet Take 1 tablet by mouth once daily at 6PM  . [DISCONTINUED] torsemide (DEMADEX) 20 MG tablet Take 1 tablet (20 mg total) by mouth daily as needed (weight of 215 lbs or more).    Allergies:   Amiodarone   Social History   Social History  . Marital status: Married    Spouse name: N/A  . Number of children: 4  . Years of education: N/A   Occupational History  . retired     retired Optometrist; now in [employer], political party  .  Retired   Social History Main Topics  . Smoking status: Never Smoker  . Smokeless tobacco: Never Used  . Alcohol use No  . Drug use: No  . Sexual activity: No   Other Topics Concern  . None   Social History Narrative  . None     Family History:  The patient's family history includes Cancer in his maternal uncle; Heart disease in his father; Heart failure in his mother; Stroke in his mother.   ROS:   Please see the history of present illness.    ROS All other systems reviewed and are negative.  No flowsheet data found.     PHYSICAL EXAM:   VS:  BP 112/66   Pulse 70   Ht 6\' 4"  (1.93 m)   Wt 202- this weight is from home Comment: pt stated weight-couldnt stand on scale  SpO2 94%   BMI 24.95 kg/m    GEN: Well nourished, well developed, in no acute distress  HEENT: normal  Neck: no JVD, carotid bruits, or masses Cardiac: RRR; no murmurs, rubs, or gallops.  1+edema.  Intact distal pulses bilaterally.  Respiratory:  clear to auscultation bilaterally, normal work of breathing GI: soft, nontender, nondistended, + BS MS: no deformity or atrophy  Skin: warm and dry, no rash Neuro:  Alert and Oriented x 3,  Strength and sensation are intact Psych: euthymic mood, full affect  Wt Readings from Last 3 Encounters:  11/05/16 205 lb (93 kg)  10/29/16 209 lb (94.8 kg)  08/16/16 218 lb 3.2 oz (99 kg)      Studies/Labs Reviewed:   EKG:  EKG is not ordered today.    Recent Labs: 02/04/2016: Magnesium 2.1 10/29/2016: ALT 11; Brain Natriuretic Peptide 273.9; BUN 25; Creat 1.72; Hemoglobin 12.9; Platelets  145; Potassium 4.2; Sodium 141; TSH 1.06   Lipid Panel    Component Value Date/Time   CHOL 124 07/27/2016 1030   CHOL 134 07/06/2014 1034   TRIG 49 07/27/2016 1030   TRIG 76 07/06/2014 1034   HDL 46 07/27/2016 1030   HDL 44 07/06/2014 1034   CHOLHDL 2.7 07/27/2016 1030   CHOLHDL 2.9 11/18/2015 0940   VLDL 9 11/18/2015 0940   LDLCALC 68 07/27/2016 1030   LDLCALC 75 07/06/2014 1034    Additional studies/ records that were reviewed today include:  none    ASSESSMENT:    1. Chronic systolic heart failure (HCC)   2. Coronary artery disease of bypass graft of native heart with stable angina pectoris (HCC)   3. Nonischemic cardiomyopathy (HCC)   4. Benign essential HTN   5. Persistent atrial fibrillation (HCC)   6. Pure hypercholesterolemia   7. Fatigue due to excessive exertion, initial encounter   8. Acute on chronic systolic CHF (congestive heart failure), NYHA class 4 (HCC)      PLAN:  In order of problems listed above:  1. ASCAD with history of nonobstructive ASCAD by cath 2008.  He has no anginal symptoms.   2. Nonischemic DCM with EF 35-40% by recent echo (similar to echo 01/2016).  S/P Medtronic CRT-D with chronic LBBB 3. Acute on chronic systolic CHF NYHA Class IV.  He has had worsening exertional fatigue to the point that he cannot do his ADLs.  He has DOE with minimal activity although no PND or orthopnea at night.  He has mild LE edema on exam.  I think this is likely low output HF.  I will check a TSH and CBC to rule out other causes of fatigue as well as check a BMET and  BNP.  I have recommended that he be admitted to [facility] for further evaluation as he can barely walk without symptoms.  He is adamant that he will not go to the hospital.  I have spoken directly to the AHF clinic and he will be seen in the office tomorrow.  I encouraged him to call [phone] if his symptoms worsen but at this time he is adamant that he will not be admitted to the hospital.  He will continue on Carvedilol low dose as well as digoxin. I will check a dig level today.  Continue low dose ACE I.  His BP is soft so will hold on uptitration.  Given his CKD, I am reluctant to try Entresto.  He has lost significant weight recently due to poor appetite.  He had been dosing PRN lasix based on a sliding scale so has not used diuretics in some time.  I will start him on Demadex 20mg  daily for now.  Will be seen in AHF clinic tomorrow 4. HTN - his BP is adequately controlled on exam today. 5. Persistent atrial fibrillation - he is maintaining NSR on exam today 6. Hyperlipidemia with LDL goal < 70. 7. Excessive fatigue likely secondary to #3.    Medication Adjustments/Labs and Tests Ordered: Current medicines are reviewed at length with the patient today.  Concerns regarding medicines are outlined above.  Medication changes, Labs and Tests ordered today are listed in the Patient Instructions below.  Patient Instructions  Medication Instructions:  1) START DEMADEX 20 mg daily  Labwork: TODAY: BMET, CBC, TSH, LFTs, digoxin  Testing/Procedures: None  Follow-Up: You have an appointment TOMORROW, 11/05/16 in the HEART FAILURE CLINIC at 2:30PM. This is located at [address] in the Heart and Vascular Center at [facility]. The parking garage is accessible via [facility] and is under the Heart and Vascular Center. The parking code is 6001. The Heart Failure phone number is [phone] if you have any further questions.   You have a follow-up appointment with Dr. Mayford Knife scheduled 02/19/17  at 11:20AM.  Any Other Special Instructions Will Be Listed Below (If Applicable).     If you need a refill on your cardiac medications before your next appointment, please call your pharmacy.      Signed, Armanda Magic, MD  11/05/2016 1:50 PM    [facility] [address] [place], [place], [place]  [address] Phone: [phone]; Fax: [phone]

## 2016-11-05 ENCOUNTER — Ambulatory Visit (INDEPENDENT_AMBULATORY_CARE_PROVIDER_SITE_OTHER): Payer: Medicare Other | Admitting: Cardiology

## 2016-11-05 ENCOUNTER — Encounter: Payer: Self-pay | Admitting: Cardiology

## 2016-11-05 VITALS — BP 112/66 | HR 70 | Ht 76.0 in | Wt 205.0 lb

## 2016-11-05 DIAGNOSIS — E7800 Pure hypercholesterolemia, unspecified: Secondary | ICD-10-CM | POA: Diagnosis not present

## 2016-11-05 DIAGNOSIS — T733XXA Exhaustion due to excessive exertion, initial encounter: Secondary | ICD-10-CM | POA: Diagnosis not present

## 2016-11-05 DIAGNOSIS — I5022 Chronic systolic (congestive) heart failure: Secondary | ICD-10-CM | POA: Diagnosis not present

## 2016-11-05 DIAGNOSIS — I25708 Atherosclerosis of coronary artery bypass graft(s), unspecified, with other forms of angina pectoris: Secondary | ICD-10-CM | POA: Diagnosis not present

## 2016-11-05 DIAGNOSIS — I481 Persistent atrial fibrillation: Secondary | ICD-10-CM | POA: Diagnosis not present

## 2016-11-05 DIAGNOSIS — I10 Essential (primary) hypertension: Secondary | ICD-10-CM

## 2016-11-05 DIAGNOSIS — I428 Other cardiomyopathies: Secondary | ICD-10-CM

## 2016-11-05 DIAGNOSIS — I4819 Other persistent atrial fibrillation: Secondary | ICD-10-CM

## 2016-11-05 DIAGNOSIS — I5023 Acute on chronic systolic (congestive) heart failure: Secondary | ICD-10-CM

## 2016-11-05 MED ORDER — TORSEMIDE 20 MG PO TABS: 20.0000 mg | ORAL_TABLET | Freq: Every day | ORAL | 11 refills | Status: DC

## 2016-11-05 NOTE — Patient Instructions (Addendum)
Medication Instructions:  1) START DEMADEX 20 mg daily  Labwork: TODAY: BMET, CBC, TSH, LFTs, digoxin  Testing/Procedures: None  Follow-Up: You have an appointment TOMORROW, 11/05/16 in the HEART FAILURE CLINIC at 2:30PM. This is located at [address] in the Heart and V[facility] Center at [facility]. The parking garage is accessible via [facility] and is under the Heart and Vascular Center. The parking code is 6001. The Heart Failure phone number is [phone] if you have any further questions.   You have a follow-up appointment with Dr. Mayford Knife scheduled 02/19/17 at 11:20AM.  Any Other Special Instructions Will Be Listed Below (If Applicable).     If you need a refill on your cardiac medications before your next appointment, please call your pharmacy.

## 2016-11-06 ENCOUNTER — Ambulatory Visit (HOSPITAL_COMMUNITY)
Admission: RE | Admit: 2016-11-06 | Discharge: 2016-11-06 | Disposition: A | Discharge status: Home / Self Care | Payer: Medicare Other | Source: Ambulatory Visit | Attending: Cardiology | Admitting: Cardiology

## 2016-11-06 VITALS — BP 100/52 | HR 70 | Wt 202.2 lb

## 2016-11-06 DIAGNOSIS — Z7901 Long term (current) use of anticoagulants: Secondary | ICD-10-CM | POA: Diagnosis not present

## 2016-11-06 DIAGNOSIS — I447 Left bundle-branch block, unspecified: Secondary | ICD-10-CM | POA: Insufficient documentation

## 2016-11-06 DIAGNOSIS — I480 Paroxysmal atrial fibrillation: Secondary | ICD-10-CM | POA: Diagnosis not present

## 2016-11-06 DIAGNOSIS — I5022 Chronic systolic (congestive) heart failure: Secondary | ICD-10-CM | POA: Insufficient documentation

## 2016-11-06 DIAGNOSIS — R5381 Other malaise: Secondary | ICD-10-CM | POA: Diagnosis not present

## 2016-11-06 DIAGNOSIS — I429 Cardiomyopathy, unspecified: Secondary | ICD-10-CM | POA: Diagnosis not present

## 2016-11-06 DIAGNOSIS — N189 Chronic kidney disease, unspecified: Secondary | ICD-10-CM | POA: Diagnosis not present

## 2016-11-06 DIAGNOSIS — I130 Hypertensive heart and chronic kidney disease with heart failure and stage 1 through stage 4 chronic kidney disease, or unspecified chronic kidney disease: Secondary | ICD-10-CM | POA: Insufficient documentation

## 2016-11-06 DIAGNOSIS — E039 Hypothyroidism, unspecified: Secondary | ICD-10-CM | POA: Insufficient documentation

## 2016-11-06 DIAGNOSIS — G4733 Obstructive sleep apnea (adult) (pediatric): Secondary | ICD-10-CM | POA: Insufficient documentation

## 2016-11-06 DIAGNOSIS — I2510 Atherosclerotic heart disease of native coronary artery without angina pectoris: Secondary | ICD-10-CM | POA: Insufficient documentation

## 2016-11-06 LAB — HEPATIC FUNCTION PANEL
ALBUMIN: 4.2 g/dL (ref 3.5–4.7)
ALK PHOS: 81 IU/L (ref 39–117)
ALT: 14 IU/L (ref 0–44)
AST: 14 IU/L (ref 0–40)
BILIRUBIN TOTAL: 0.7 mg/dL (ref 0.0–1.2)
Bilirubin, Direct: 0.24 mg/dL (ref 0.00–0.40)
Total Protein: 6.3 g/dL (ref 6.0–8.5)

## 2016-11-06 LAB — BASIC METABOLIC PANEL
BUN/Creatinine Ratio: 22 (ref 10–24)
BUN: 29 mg/dL — AB (ref 8–27)
CO2: 21 mmol/L (ref 18–29)
CREATININE: 1.31 mg/dL — AB (ref 0.76–1.27)
Calcium: 8.7 mg/dL (ref 8.6–10.2)
Chloride: 107 mmol/L — ABNORMAL HIGH (ref 96–106)
GFR, EST AFRICAN AMERICAN: 58 mL/min/{1.73_m2} — AB (ref >59)
GFR, EST NON AFRICAN AMERICAN: 50 mL/min/{1.73_m2} — AB (ref >59)
Glucose: 120 mg/dL — ABNORMAL HIGH (ref 65–99)
Potassium: 4.4 mmol/L (ref 3.5–5.2)
Sodium: 142 mmol/L (ref 134–144)

## 2016-11-06 LAB — CBC WITH DIFFERENTIAL/PLATELET
BASOS: 0 %
Basophils Absolute: 0 10*3/uL (ref 0.0–0.2)
EOS (ABSOLUTE): 0.1 10*3/uL (ref 0.0–0.4)
EOS: 1 %
HEMATOCRIT: 40.4 % (ref 37.5–51.0)
HEMOGLOBIN: 13.1 g/dL (ref 13.0–17.7)
Immature Grans (Abs): 0.1 10*3/uL (ref 0.0–0.1)
Immature Granulocytes: 1 %
Lymphocytes Absolute: 0.8 10*3/uL (ref 0.7–3.1)
Lymphs: 10 %
MCH: 29.8 pg (ref 26.6–33.0)
MCHC: 32.4 g/dL (ref 31.5–35.7)
MCV: 92 fL (ref 79–97)
MONOCYTES: 8 %
Monocytes Absolute: 0.6 10*3/uL (ref 0.1–0.9)
NEUTROS ABS: 6 10*3/uL (ref 1.4–7.0)
Neutrophils: 80 %
Platelets: 151 10*3/uL (ref 150–379)
RBC: 4.4 x10E6/uL (ref 4.14–5.80)
RDW: 14.2 % (ref 12.3–15.4)
WBC: 7.6 10*3/uL (ref 3.4–10.8)

## 2016-11-06 LAB — TSH: TSH: 1.86 u[IU]/mL (ref 0.450–4.500)

## 2016-11-06 LAB — DIGOXIN LEVEL: DIGOXIN, SERUM: 0.5 ng/mL (ref 0.5–0.9)

## 2016-11-06 MED ORDER — TORSEMIDE 20 MG PO TABS: 20.0000 mg | ORAL_TABLET | Freq: Every day | ORAL | 6 refills | Status: DC | PRN

## 2016-11-06 NOTE — Progress Notes (Signed)
Advanced Heart Failure Medication Review by a Pharmacist  Does the patient  feel that his/her medications are working for him/her?  yes  Has the patient been experiencing any side effects to the medications prescribed?  no  Does the patient measure his/her own blood pressure or blood glucose at home?  yes   Does the patient have any problems obtaining medications due to transportation or finances?   no  Understanding of regimen: good Understanding of indications: good Potential of compliance: good Patient understands to avoid NSAIDs. Patient understands to avoid decongestants.  Issues to address at subsequent visits: None   Pharmacist comments: [name] [name] is a pleasant 83 yo M presenting without a medication list but with good recall of his regimen. He reports good compliance with his medications but states he did have to take his PRN torsemide yesterday and his weight is down 5 lbs today. No other medication-related questions or concerns for me at this time.   [name] [name]. [name] [name], PharmD, BCPS, CPP Clinical Pharmacist Pager: [phone] Phone: [phone] 11/06/2016 11:04 AM      Time with patient: 10 minutes Preparation and documentation time: 2 minutes Total time: 12 minutes

## 2016-11-06 NOTE — Progress Notes (Signed)
Patient ID: [name] [name], male   DOB: 1932-05-14, 83 y.o.   MRN: [id number]     Advanced Heart Failure Clinic Note   Primary Cardiologist: Dr Mayford Knife PCP: Dr Arlee Muslim  HPI: [name] [name] is a 83 y.o. male with PMH of A tach S/P successful DC-CV 12/29/12, CAD, PAF on chronic coumadin and amiodarone, OSA- CPAP, NICM cath 2008, chronic systolic heart failure EF 25-30% (01/2013) , S/P Med[employer]onic CRT-D 2008 and generator changed 2012, LBBB, CRI (baseline 1.7-1.8) and Hypothyroidism.   He is S/P DC-CV 12/29/12 and initially he felt good for about a week. He has had a functional decline and only able to walk a few steps. He presented [facility] ED 01/22/13 with increased dyspnea on exertion and low extremity edema. He was started on a lasix gtt 10 mg hr. Admit weight 233 lbs. Discharge weight: 214 lbs  Pt last seen in HF clinic 07/2014. Was thought to be stable.   Pt presents today for add on for weight gain.  Was seen in [facility] clinic yesterday and concern raised for low output with fatigue. BNP 270 last week. Creatinine stable on labs 11/05/16. Has gradually felt worse over the past few months.  States he has noticed less and less energy. Fatigued after walking 40-50 feet with mild SOB.Denies orthopnea or PND. Denies lightheadedness or dizziness. No CP or tachypalpitations. Hadn't been on torsemide for several years.  Took 20 mg torsemide yesterday and was down 5 lbs. He has not used his CPAP in about 4 years. Weight at home 197 (down from 202 yesterday)  Optivol: Fluid below threshold with no recent crossing. Thoracic impedence above threshold. Pt activity less than 15-20 minutes daily.   Echo 10/31/2016 LVEF 35-40%, Mod AI, Mod MR, Severe LAE, Moderate RVH, Severe RAE, PA peak pressure 19 mm Hg  Labs 8/21: Na 132 K 4.5 Cr 1.7 (stable)  Labs 03/02/13 NA 135 Potassium 4.6 Creatinine 1.6 Dig 1.6 TSH 3.95        04/02/13: digoxin 0.8, K+ 4.5, creatinine 1.48         09/08/13 K 4.4 Creatine 1.7          Review  of systems complete and found to be negative unless listed in HPI.    Past Medical History:  Diagnosis Date  . Anemia, unspecified 04/07/2013  . Atrial fibrillation (HCC)    fibrillation/flutter  . BPH (benign prostatic hypertrophy)   . Chronic systolic CHF (congestive heart failure) (HCC)    Nonischemic DCM EF 10% s/p BiV AICD  . Coronary artery disease 2008   nonobstructive ASCAD  . Depression   . Hyperlipidemia   . Hypertension   . Hypothyroidism   . Left bundle branch block   . Nonischemic cardiomyopathy (HCC)    ef 10 %  . OSA (obstructive sleep apnea)    intolerant to CPAP  . Osteoarthritis   . Sleep apnea    stopped using CPAP  . Thrombocytopenia (HCC)     Current Outpatient Prescriptions  Medication Sig Dispense Refill  . acetaminophen (TYLENOL) 650 MG CR tablet Take 1 tablet (650 mg total) by mouth every 8 (eight) hours as needed for pain. 90 tablet 3  . AMBULATORY NON FORMULARY MEDICATION Front wheel rolling [place] for use as needed due to gait instability. Dispense 1   Fax to [phone]  R26.81 1 each 0  . carvedilol (COREG) 3.125 MG tablet Take 1 tablet (3.125 mg total) by mouth 2 (two) times daily. 180  tablet 2  . DIGOX 125 MCG tablet Take 1 tablet by mouth  every other day 45 tablet 3  . levothyroxine (SYNTHROID, LEVOTHROID) 137 MCG tablet TAKE 1 TABLET BY MOUTH  DAILY BEFORE BREAKFAST 90 tablet 1  . potassium chloride SA (K-DUR,KLOR-CON) 20 MEQ tablet Take 1 tablet (20 mEq total) by mouth daily as needed (with each torsemide dose). 10 tablet 10  . ramipril (ALTACE) 2.5 MG capsule Take 2.5 mg by mouth daily.    . simvastatin (ZOCOR) 10 MG tablet Take 1 tablet by mouth  every evening 90 tablet 2  . spironolactone (ALDACTONE) 25 MG tablet Take 0.5 tablets (12.5 mg total) by mouth daily. 45 tablet 3  . torsemide (DEMADEX) 20 MG tablet Take 20 mg by mouth daily as needed (weight gain).    Marland Kitchen warfarin (COUMADIN) 1 MG tablet Take 1 tablet by mouth on Tuesday and  Friday in addition to 5 mg tablet. 14 tablet 0  . warfarin (COUMADIN) 5 MG tablet Take 1 tablet by mouth once daily at 6PM 90 tablet 0   No current facility-administered medications for this encounter.     Vitals:   11/06/16 1048  BP: (!) 100/52  Pulse: 70  SpO2: 97%  Weight: 202 lb 3.2 oz (91.7 kg)    PHYSICAL EXAM: General: Elderly and fatigued appearing. HEENT: Normal Neck: supple. JVD 6-7 cm. Carotids 2+ bilat; no bruits. No thyromegaly or nodule noted. Cor: PMI nondisplaced. RRR, Soft 2/6 MR Lungs: Clear.  Abdomen: soft, non-tender, distended, no HSM. No bruits or masses. +BS  Extremities: no cyanosis, clubbing, rash, Trace edema at most. Warm to the touch.   Neuro: alert & orientedx3, cranial nerves grossly intact. moves all 4 extremities w/o difficulty. Affect pleasant   ASSESSMENT & PLAN:  1) Chronic systolic HF, NICM 01/2013 EF 20%; Optivol CRT- - NYHA III symptoms -> fatigue. - Volume status looks stable on exam and optivol. - Would continue torsemide AS NEEDED only.   - Continue spironolactone 12.5 mg daily.  - Stop coreg with fatigue.  Very low suspicion for low output at this time.  - Continue ramipril 2.5 mg daily - Reinforced fluid restriction to < 2 L daily, sodium restriction to less than 2000 mg daily, and the importance of daily weights.    2) OSA - Continue CPAP at night.  3) Atrial tach/AF- - Regular on exam. 4 episodes of AT/AF on optivol. Longest episode ~ 12 minutes.  - Continue amiodarone 100 mg daily. Recent LFTs/TSH stable.  - Needs yearly eye exam - Continue INR per Dr Zachery Dauer on coumadin.  4) Deconditioning - Think this is the primary driving factor of his fatigue with <20 minutes of activity daily on average for > last 3 months by optivol. - He does not appear low out put on exam. He is not tachycardic, nor volume overloaded. BP chronically soft.   Would continue torsemide as needed only. Will stop coreg to see if this helps with fatigue.  Offered RHC for completion sake and measurement of cardiac output, but he refuses at this time. Knows to call back with any worsening symptoms. Labs yesterday stable on exam.   We will keep close follow up for 4 weeks.   Graciella Freer, PA-C  11:04 AM  Greater than 50% of the 25 minute visit was spent in counseling/coordination of care regarding disease state education, medication reconciliation, discussion of plan with MD, and review of medical regimen with on site Pharm-D.

## 2016-11-06 NOTE — Patient Instructions (Addendum)
STOP Carvedilol (Coreg).  Take torsemide 20 mg once daily AS NEEDED for weight gain/swelling.  Follow up 4 weeks with Otilio Saber PA-C.  Do the following things EVERYDAY: 1) Weigh yourself in the morning before breakfast. Write it down and keep it in a log. 2) Take your medicines as prescribed 3) Eat low salt foods-Limit salt (sodium) to 2000 mg per day.  4) Stay as active as you can everyday 5) Limit all fluids for the day to less than 2 liters

## 2016-11-09 ENCOUNTER — Telehealth (HOSPITAL_COMMUNITY): Payer: Self-pay | Admitting: *Deleted

## 2016-11-09 NOTE — Telephone Encounter (Signed)
Pt called asking which OTC medication he could take for leg pain. I advised patient to take tylenol.

## 2016-11-10 ENCOUNTER — Other Ambulatory Visit: Payer: Self-pay | Admitting: Family Medicine

## 2016-11-10 DIAGNOSIS — E039 Hypothyroidism, unspecified: Secondary | ICD-10-CM

## 2016-11-15 ENCOUNTER — Ambulatory Visit (INDEPENDENT_AMBULATORY_CARE_PROVIDER_SITE_OTHER): Payer: Medicare Other | Admitting: *Deleted

## 2016-11-15 DIAGNOSIS — I428 Other cardiomyopathies: Secondary | ICD-10-CM

## 2016-11-15 NOTE — Progress Notes (Signed)
Remote ICD transmission.   

## 2016-11-16 ENCOUNTER — Encounter: Payer: Self-pay | Admitting: Cardiology

## 2016-11-20 LAB — CUP PACEART REMOTE DEVICE CHECK
Battery Remaining Longevity: 80 mo
Brady Statistic AP VS Percent: 0.36 %
Brady Statistic AS VP Percent: 12 %
Brady Statistic AS VS Percent: 5.24 %
Brady Statistic RV Percent Paced: 88.85 %
Date Time Interrogation Session: 20180524052303
HIGH POWER IMPEDANCE MEASURED VALUE: 51 Ohm
HighPow Impedance: 42 Ohm
Implantable Lead Implant Date: 20080806
Implantable Lead Implant Date: 20080806
Implantable Lead Location: 753859
Implantable Lead Model: 6947
Implantable Pulse Generator Implant Date: 20171115
Lead Channel Impedance Value: 1007 Ohm
Lead Channel Impedance Value: 380 Ohm
Lead Channel Impedance Value: 380 Ohm
Lead Channel Impedance Value: 513 Ohm
Lead Channel Pacing Threshold Pulse Width: 0.4 ms
Lead Channel Sensing Intrinsic Amplitude: 0.75 mV
Lead Channel Sensing Intrinsic Amplitude: 0.75 mV
Lead Channel Sensing Intrinsic Amplitude: 8.25 mV
Lead Channel Setting Pacing Amplitude: 2 V
Lead Channel Setting Sensing Sensitivity: 0.3 mV
MDC IDC LEAD IMPLANT DT: 20080806
MDC IDC LEAD LOCATION: 753858
MDC IDC LEAD LOCATION: 753860
MDC IDC MSMT BATTERY VOLTAGE: 3.02 V
MDC IDC MSMT LEADCHNL LV IMPEDANCE VALUE: 608 Ohm
MDC IDC MSMT LEADCHNL RV IMPEDANCE VALUE: 437 Ohm
MDC IDC MSMT LEADCHNL RV PACING THRESHOLD AMPLITUDE: 0.625 V
MDC IDC MSMT LEADCHNL RV SENSING INTR AMPL: 8.25 mV
MDC IDC SET LEADCHNL LV PACING AMPLITUDE: 2.5 V
MDC IDC SET LEADCHNL LV PACING PULSEWIDTH: 0.4 ms
MDC IDC SET LEADCHNL RV PACING AMPLITUDE: 2 V
MDC IDC SET LEADCHNL RV PACING PULSEWIDTH: 0.4 ms
MDC IDC STAT BRADY AP VP PERCENT: 82.41 %
MDC IDC STAT BRADY RA PERCENT PACED: 74.95 %

## 2016-11-21 ENCOUNTER — Encounter: Payer: Self-pay | Admitting: Family Medicine

## 2016-11-21 ENCOUNTER — Ambulatory Visit (INDEPENDENT_AMBULATORY_CARE_PROVIDER_SITE_OTHER): Payer: Medicare Other | Admitting: Family Medicine

## 2016-11-21 VITALS — BP 124/62 | HR 74 | Temp 98.0°F | Wt 212.0 lb

## 2016-11-21 DIAGNOSIS — R059 Cough, unspecified: Secondary | ICD-10-CM

## 2016-11-21 DIAGNOSIS — R05 Cough: Secondary | ICD-10-CM

## 2016-11-21 MED ORDER — BENZONATATE 200 MG PO CAPS: 200.0000 mg | ORAL_CAPSULE | Freq: Three times a day (TID) | ORAL | 0 refills | Status: DC | PRN

## 2016-11-21 NOTE — Progress Notes (Signed)
[name] [name] is a 83 y.o. male who presents to [name]: Primary Care Sports Medicine today for cough. Patient has a 2 day history of mild cough and congestion. He denies shortness of breath or leg swelling. He notes an initial sore throat which has since improved. The cough is improving as well. He denies fevers chills vomiting or diarrhea. He's tried some over-the-counter medications which helped a bit.  His wife is currently in a rehabilitation facility after breaking her wrist.   Past Medical History:  Diagnosis Date  . Anemia, unspecified 04/07/2013  . Atrial fibrillation (HCC)    fibrillation/flutter  . BPH (benign prostatic hypertrophy)   . Chronic systolic CHF (congestive heart failure) (HCC)    Nonischemic DCM EF 10% s/p BiV AICD  . Coronary artery disease 2008   nonobstructive ASCAD  . Depression   . Hyperlipidemia   . Hypertension   . Hypothyroidism   . Left bundle branch block   . Nonischemic cardiomyopathy (HCC)    ef 10 %  . OSA (obstructive sleep apnea)    intolerant to CPAP  . Osteoarthritis   . Sleep apnea    stopped using CPAP  . Thrombocytopenia (HCC)    Past Surgical History:  Procedure Laterality Date  . CARDIAC DEFIBRILLATOR PLACEMENT     left chest  . CARDIOVERSION N/A 12/29/2012   Procedure: CARDIOVERSION;  Surgeon: Quintella Reichert, MD;  Location: [facility] ENDOSCOPY;  Service: Cardiovascular;  Laterality: N/A;  . CHOLECYSTECTOMY    . Colonscopy     reportedly negative per pt; around 2005.    . EP IMPLANTABLE DEVICE N/A 05/09/2016   Procedure: BIVI ICD Generator Changeout;  Surgeon: Duke Salvia, MD;  Location: [facility] INVASIVE CV LAB;  Service: Cardiovascular;  Laterality: N/A;  . TRANSURETHRAL RESECTION OF PROSTATE     for BPH   Social History  Substance Use Topics  . Smoking status: Never Smoker  . Smokeless tobacco: Never Used  . Alcohol use No   family  history includes Cancer in his maternal uncle; Heart disease in his father; Heart failure in his mother; Stroke in his mother.  ROS as above:  Medications: Current Outpatient Prescriptions  Medication Sig Dispense Refill  . acetaminophen (TYLENOL) 650 MG CR tablet Take 1 tablet (650 mg total) by mouth every 8 (eight) hours as needed for pain. 90 tablet 3  . AMBULATORY NON FORMULARY MEDICATION Front wheel rolling [place] for use as needed due to gait instability. Dispense 1   Fax to [phone]  R26.81 1 each 0  . DIGOX 125 MCG tablet Take 1 tablet by mouth  every other day 45 tablet 3  . levothyroxine (SYNTHROID, LEVOTHROID) 137 MCG tablet TAKE 1 TABLET BY MOUTH  DAILY BEFORE BREAKFAST 90 tablet 1  . potassium chloride SA (K-DUR,KLOR-CON) 20 MEQ tablet Take 1 tablet (20 mEq total) by mouth daily as needed (with each torsemide dose). 10 tablet 10  . ramipril (ALTACE) 2.5 MG capsule Take 2.5 mg by mouth daily.    . simvastatin (ZOCOR) 10 MG tablet Take 1 tablet by mouth  every evening 90 tablet 2  . spironolactone (ALDACTONE) 25 MG tablet Take 0.5 tablets (12.5 mg total) by mouth daily. 45 tablet 3  . torsemide (DEMADEX) 20 MG tablet Take 1 tablet (20 mg total) by mouth daily as needed (weight gain). 30 tablet 6  . warfarin (COUMADIN) 1 MG tablet Take 1 tablet by mouth on Tuesday and Friday  in addition to 5 mg tablet. 14 tablet 0  . warfarin (COUMADIN) 5 MG tablet Take 1 tablet by mouth once daily at 6PM 90 tablet 0  . benzonatate (TESSALON) 200 MG capsule Take 1 capsule (200 mg total) by mouth 3 (three) times daily as needed for cough. 45 capsule 0   No current facility-administered medications for this visit.    Allergies  Allergen Reactions  . Amiodarone Other (See Comments)    Neuro toxicity    Health Maintenance Health Maintenance  Topic Date Due  . PNA vac Low Risk Adult (1 of 2 - PCV13) 11/28/2016 (Originally 05/29/1999)  . INFLUENZA VACCINE  01/23/2017  . TETANUS/TDAP   07/01/2019     Exam:  BP 124/62   Pulse 74   Temp 98 F (36.7 C) (Oral)   Wt 212 lb (96.2 kg)   SpO2 97%   BMI 25.81 kg/m  Gen: Well NAD HEENT: EOMI,  MMM Clear nasal discharge normal posterior pharynx. Lungs: Normal work of breathing. CTABL Heart: RRR no MRG Abd: NABS, Soft. Nondistended, Nontender Exts: Brisk capillary refill, warm and well perfused. No significant edema  Chest x-ray pending   No results found for this or any previous visit (from the past 72 hour(s)). No results found.    Assessment and Plan: 83 y.o. male with cough. Likely viral. Plan to obtain chest x-ray as patient is generally not healthy and frail. Plan to treat symptomatically with Tylenol and Tessalon Perles. Recheck if not better.   Orders Placed This Encounter  Procedures  . DG Chest 2 View    Order Specific Question:   Reason for exam:    Answer:   Cough, assess intra-thoracic pathology    Order Specific Question:   Preferred imaging location?    Answer:   [facility]   Meds ordered this encounter  Medications  . benzonatate (TESSALON) 200 MG capsule    Sig: Take 1 capsule (200 mg total) by mouth 3 (three) times daily as needed for cough.    Dispense:  45 capsule    Refill:  0     Discussed warning signs or symptoms. Please see discharge instructions. Patient expresses understanding.

## 2016-11-21 NOTE — Patient Instructions (Addendum)
Thank you for coming in today. Take tessalon for cough as needed.   Xray today.   Return if not better.   Call or go to the emergency room if you get worse, have trouble breathing, have chest pains, or palpitations.

## 2016-11-22 ENCOUNTER — Ambulatory Visit (INDEPENDENT_AMBULATORY_CARE_PROVIDER_SITE_OTHER): Payer: Medicare Other

## 2016-11-22 DIAGNOSIS — R05 Cough: Secondary | ICD-10-CM

## 2016-11-22 DIAGNOSIS — Z950 Presence of cardiac pacemaker: Secondary | ICD-10-CM | POA: Diagnosis not present

## 2016-11-26 ENCOUNTER — Emergency Department (HOSPITAL_COMMUNITY)
Admission: EM | Admit: 2016-11-26 | Discharge: 2016-11-26 | Disposition: A | Discharge status: Home / Self Care | Payer: Medicare Other | Attending: Emergency Medicine | Admitting: Emergency Medicine

## 2016-11-26 ENCOUNTER — Emergency Department (HOSPITAL_COMMUNITY): Payer: Medicare Other

## 2016-11-26 ENCOUNTER — Encounter (HOSPITAL_COMMUNITY): Payer: Self-pay

## 2016-11-26 DIAGNOSIS — Z79899 Other long term (current) drug therapy: Secondary | ICD-10-CM | POA: Diagnosis not present

## 2016-11-26 DIAGNOSIS — E039 Hypothyroidism, unspecified: Secondary | ICD-10-CM | POA: Insufficient documentation

## 2016-11-26 DIAGNOSIS — Y999 Unspecified external cause status: Secondary | ICD-10-CM | POA: Insufficient documentation

## 2016-11-26 DIAGNOSIS — Y939 Activity, unspecified: Secondary | ICD-10-CM | POA: Diagnosis not present

## 2016-11-26 DIAGNOSIS — W01198A Fall on same level from slipping, tripping and stumbling with subsequent striking against other object, initial encounter: Secondary | ICD-10-CM | POA: Insufficient documentation

## 2016-11-26 DIAGNOSIS — S0083XA Contusion of other part of head, initial encounter: Secondary | ICD-10-CM | POA: Diagnosis not present

## 2016-11-26 DIAGNOSIS — Z7901 Long term (current) use of anticoagulants: Secondary | ICD-10-CM | POA: Insufficient documentation

## 2016-11-26 DIAGNOSIS — I5022 Chronic systolic (congestive) heart failure: Secondary | ICD-10-CM | POA: Insufficient documentation

## 2016-11-26 DIAGNOSIS — I2510 Atherosclerotic heart disease of native coronary artery without angina pectoris: Secondary | ICD-10-CM | POA: Diagnosis not present

## 2016-11-26 DIAGNOSIS — I110 Hypertensive heart disease with heart failure: Secondary | ICD-10-CM | POA: Insufficient documentation

## 2016-11-26 DIAGNOSIS — S0990XA Unspecified injury of head, initial encounter: Secondary | ICD-10-CM | POA: Diagnosis not present

## 2016-11-26 DIAGNOSIS — Y92009 Unspecified place in unspecified non-institutional (private) residence as the place of occurrence of the external cause: Secondary | ICD-10-CM | POA: Insufficient documentation

## 2016-11-26 DIAGNOSIS — W19XXXA Unspecified fall, initial encounter: Secondary | ICD-10-CM

## 2016-11-26 DIAGNOSIS — T1490XA Injury, unspecified, initial encounter: Secondary | ICD-10-CM | POA: Diagnosis not present

## 2016-11-26 LAB — PROTIME-INR
INR: 2.33
PROTHROMBIN TIME: 25.9 s — AB (ref 11.4–15.2)

## 2016-11-26 LAB — BASIC METABOLIC PANEL
ANION GAP: 8 (ref 5–15)
BUN: 26 mg/dL — AB (ref 6–20)
CO2: 28 mmol/L (ref 22–32)
Calcium: 8.9 mg/dL (ref 8.9–10.3)
Chloride: 106 mmol/L (ref 101–111)
Creatinine, Ser: 1.32 mg/dL — ABNORMAL HIGH (ref 0.61–1.24)
GFR, EST AFRICAN AMERICAN: 56 mL/min — AB (ref >60)
GFR, EST NON AFRICAN AMERICAN: 49 mL/min — AB (ref >60)
Glucose, Bld: 116 mg/dL — ABNORMAL HIGH (ref 65–99)
POTASSIUM: 4 mmol/L (ref 3.5–5.1)
SODIUM: 142 mmol/L (ref 135–145)

## 2016-11-26 LAB — CBC
HCT: 39.3 % (ref 39.0–52.0)
Hemoglobin: 12.9 g/dL — ABNORMAL LOW (ref 13.0–17.0)
MCH: 30.9 pg (ref 26.0–34.0)
MCHC: 32.8 g/dL (ref 30.0–36.0)
MCV: 94.2 fL (ref 78.0–100.0)
PLATELETS: 104 10*3/uL — AB (ref 150–400)
RBC: 4.17 MIL/uL — AB (ref 4.22–5.81)
RDW: 14.5 % (ref 11.5–15.5)
WBC: 7.1 10*3/uL (ref 4.0–10.5)

## 2016-11-26 NOTE — ED Provider Notes (Signed)
[facility] DEPT Provider Note   CSN: [id number] Arrival date & time: 11/26/16  1054     History   Chief Complaint Chief Complaint  Patient presents with  . Fall    HPI [name] [name] is a 83 y.o. male presenting after a fall as he was attempting to turn from his [place] and his knee locked so he had to slip himself to the ground as he could not step forward. He was trying to call his wife to help him but she could not make it there fast enough. He does report hitting the back of his head on the dresser. He denies any weakness, pre-syncope, dizziness, shortness of breath, chest pain or other prodrome prior to the fall.  HPI  Past Medical History:  Diagnosis Date  . Anemia, unspecified 04/07/2013  . Atrial fibrillation (HCC)    fibrillation/flutter  . BPH (benign prostatic hypertrophy)   . Chronic systolic CHF (congestive heart failure) (HCC)    Nonischemic DCM EF 10% s/p BiV AICD  . Coronary artery disease 2008   nonobstructive ASCAD  . Depression   . Hyperlipidemia   . Hypertension   . Hypothyroidism   . Left bundle branch block   . Nonischemic cardiomyopathy (HCC)    ef 10 %  . OSA (obstructive sleep apnea)    intolerant to CPAP  . Osteoarthritis   . Sleep apnea    stopped using CPAP  . Thrombocytopenia [facility])     Patient Active Problem List   Diagnosis Date Noted  . Fatigue due to excessive exertion 11/05/2016  . Acute on chronic systolic CHF (congestive heart failure), NYHA class 4 (HCC) 11/05/2016  . Positive RPR test 04/21/2016  . Gait instability 04/06/2016  . Spinal stenosis of lumbar region 03/19/2016  . Abdominal aortic aneurysm (HCC) 03/19/2016  . Cholecystitis 02/04/2016  . Primary osteoarthritis of left hip 11/25/2015  . Lung nodule 11/17/2014  . Primary osteoarthritis of both knees 11/03/2014  . Abnormal PFTs (pulmonary function tests) 04/07/2014  . BPH (benign prostatic hyperplasia) 01/13/2014  . Chronic renal insufficiency, stage III  (moderate) 01/13/2014  . Chronic anticoagulation 04/09/2013  . Coronary artery disease of bypass graft of native heart with stable angina pectoris (HCC) 04/09/2013  . Anemia 04/07/2013  . Benign essential HTN 02/17/2013  . Nonischemic cardiomyopathy (HCC)   . Left bundle branch block   . Thrombocytopenia (HCC)   . Biventricular implantable cardioverter-defibrillator-Medtronic 01/10/2011  . Dyspnea on exertion 08/09/2010  . Hypothyroidism 06/22/2009  . Hyperlipidemia 06/22/2009  . DEPRESSION 06/22/2009  . Persistent atrial fibrillation (HCC) 06/22/2009  . OSA (obstructive sleep apnea) 06/22/2009    Past Surgical History:  Procedure Laterality Date  . CARDIAC DEFIBRILLATOR PLACEMENT     left chest  . CARDIOVERSION N/A 12/29/2012   Procedure: CARDIOVERSION;  Surgeon: Quintella Reichert, MD;  Location[facility];  Service: Cardiovascular;  Laterality: N/A;  . CHOLECYSTECTOMY    . Colonscopy     reportedly negative per pt; around 2005.    . EP IMPLANTABLE DEVICE N/A 05/09/2016   Procedure: BIVI ICD Generator Changeout;  Surgeon: Duke Salvia, MD;  Location: [facility] INVASIVE CV LAB;  Service: Cardiovascular;  Laterality: N/A;  . TRANSURETHRAL RESECTION OF PROSTATE     for BPH       Home Medications    Prior to Admission medications   Medication Sig Start Date End Date Taking? Authorizing Provider  acetaminophen (TYLENOL) 650 MG CR tablet Take 1 tablet (650 mg total) by mouth  every 8 (eight) hours as needed for pain. 11/03/14   Monica Becton, MD  AMBULATORY NON FORMULARY MEDICATION Front wheel rolling Job for use as needed due to gait instability. Dispense 1   Fax to [phone]  R26.81 04/06/16   Rodolph Bong, MD  benzonatate (TESSALON) 200 MG capsule Take 1 capsule (200 mg total) by mouth 3 (three) times daily as needed for cough. 11/21/16   Rodolph Bong, MD  DIGOX 125 MCG tablet Take 1 tablet by mouth  every other day 03/12/16   Duke Salvia, MD  levothyroxine  (SYNTHROID, LEVOTHROID) 137 MCG tablet TAKE 1 TABLET BY MOUTH  DAILY BEFORE BREAKFAST 11/12/16   Rodolph Bong, MD  potassium chloride SA (K-DUR,KLOR-CON) 20 MEQ tablet Take 1 tablet (20 mEq total) by mouth daily as needed (with each torsemide dose). 02/16/16   Quintella Reichert, MD  ramipril (ALTACE) 2.5 MG capsule Take 2.5 mg by mouth daily.    [provider]  simvastatin (ZOCOR) 10 MG tablet Take 1 tablet by mouth  every evening 03/12/16   Duke Salvia, MD  spironolactone (ALDACTONE) 25 MG tablet Take 0.5 tablets (12.5 mg total) by mouth daily. 03/12/16   Quintella Reichert, MD  torsemide (DEMADEX) 20 MG tablet Take 1 tablet (20 mg total) by mouth daily as needed (weight gain). 11/06/16   Graciella Freer, PA-C  warfarin (COUMADIN) 1 MG tablet Take 1 tablet by mouth on Tuesday and Friday in addition to 5 mg tablet. 07/09/16   Rodolph Bong, MD  warfarin (COUMADIN) 5 MG tablet Take 1 tablet by mouth once daily at [facility] 04/16/16   Rodolph Bong, MD    Family History Family History  Problem Relation Age of Onset  . Heart failure Mother   . Stroke Mother   . Heart disease Father   . Cancer Maternal Uncle        Unknown subtype  . Heart attack Neg Hx     Social History Social History  Substance Use Topics  . Smoking status: Never Smoker  . Smokeless tobacco: Never Used  . Alcohol use No     Allergies   Amiodarone   Review of Systems Review of Systems  Constitutional: Negative for chills and fever.  HENT: Negative for ear pain and sore throat.        Hematoma of the occiput  Eyes: Negative for pain and visual disturbance.  Respiratory: Negative for cough, shortness of breath, wheezing and stridor.   Cardiovascular: Negative for chest pain, palpitations and leg swelling.  Gastrointestinal: Negative for abdominal distention, abdominal pain, blood in stool, diarrhea, nausea and vomiting.  Genitourinary: Negative for dysuria, flank pain and hematuria.  Musculoskeletal:  Positive for arthralgias, myalgias and neck stiffness. Negative for back pain, gait problem, joint swelling and neck pain.  Skin: Negative for color change, pallor, rash and wound.  Neurological: Negative for dizziness, tremors, seizures, syncope, facial asymmetry, speech difficulty, weakness, light-headedness, numbness and headaches.  All other systems reviewed and are negative.    Physical Exam Updated Vital Signs BP 125/83   Pulse 70   Temp 97.6 F (36.4 C) (Oral)   Resp 16   Ht 6\' 4"  (1.93 m)   Wt 91.6 kg (202 lb)   SpO2 97%   BMI 24.59 kg/m   Physical Exam  Constitutional: He is oriented to person, place, and time. He appears well-developed and well-nourished. No distress.  Afebrile, nontoxic-appearing, lying comfortably in bed in no  acute distress.  HENT:  Head: Normocephalic and atraumatic.  Mouth/Throat: Oropharynx is clear and moist. No oropharyngeal exudate.  Hematoma at the occiput   Eyes: Conjunctivae and EOM are normal. Pupils are equal, round, and reactive to light. Right eye exhibits no discharge. Left eye exhibits no discharge.  Neck: Normal range of motion. Neck supple. No tracheal deviation present.  Cardiovascular: Normal rate, normal heart sounds and intact distal pulses.   No murmur heard. Pulmonary/Chest: Effort normal and breath sounds normal. No stridor. No respiratory distress. He has no wheezes. He has no rales.  Abdominal: Soft. He exhibits no distension. There is no tenderness. There is no guarding.  Musculoskeletal: He exhibits no edema, tenderness or deformity.  Neurological: He is alert and oriented to person, place, and time. No cranial nerve deficit or sensory deficit. He exhibits normal muscle tone. Coordination normal.  Neurologic Exam:  - Mental status: Patient is alert and cooperative. Fluent speech and words are clear. Coherent thought processes and insight is good. Patient is oriented x 4 to person, place, time and event.  - Cranial nerves:   CN III, IV, VI: pupils equally round, reactive to light both direct and conscensual and normal accommodation. Full extra-ocular movement. CN V: motor temporalis and masseter strength intact. CN VII : muscles of facial expression intact. CN X :  midline uvula. XI strength of sternocleidomastoid and trapezius muscles 5/5, XII: tongue is midline when protruded. - Motor: No involuntary movements. Muscle tone and bulk normal throughout. Muscle strength is 5/5 in bilateral shoulder abduction, elbow flexion and extension, grip, hip extension, flexion, leg flexion and extension, ankle dorsiflexion and plantar flexion.  - Sensory: Proprioception, light tough sensation intact in all extremities.  - Cerebellar: rapid alternating movements and point to point movement intact in upper and lower extremities. Normal stance and gait with the use of his Gentle which is baseline for patient.  Skin: Skin is warm and dry. No rash noted. He is not diaphoretic. No erythema. No pallor.  Psychiatric: He has a normal mood and affect.  Nursing note and vitals reviewed.    ED Treatments / Results  Labs (all labs ordered are listed, but only abnormal results are displayed) Labs Reviewed  PROTIME-INR - Abnormal; Notable for the following:       Result Value   Prothrombin Time 25.9 (*)    All other components within normal limits  CBC - Abnormal; Notable for the following:    RBC 4.17 (*)    Hemoglobin 12.9 (*)    Platelets 104 (*)    All other components within normal limits  BASIC METABOLIC PANEL - Abnormal; Notable for the following:    Glucose, Bld 116 (*)    BUN 26 (*)    Creatinine, Ser 1.32 (*)    GFR calc non Af Amer 49 (*)    GFR calc Af Amer 56 (*)    All other components within normal limits    EKG  EKG Interpretation None       Radiology Ct Head Wo Contrast  Result Date: 11/26/2016 CLINICAL DATA:  83 year old male fell backwards striking occiput. No reported loss of consciousness. Initial  encounter. EXAM: CT HEAD WITHOUT CONTRAST TECHNIQUE: Contiguous axial images were obtained from the base of the skull through the vertex without intravenous contrast. COMPARISON:  03/22/2016. FINDINGS: Brain: No intracranial hemorrhage or CT evidence of large acute infarct. Moderate chronic microvascular changes. Global atrophy without hydrocephalus. No intracranial mass lesion noted on this unenhanced exam. Vascular:  Vascular calcifications. Skull: No skull fracture. Sinuses/Orbits: Post lens replacement. No acute orbital abnormality. Minimal mucosal thickening left maxillary sinus and ethmoid sinus air cells bilaterally. Other: Mastoid air cells and middle ear cavities are clear. Scarring left frontal subcutaneous region. Possible scalp injury left occipital region. IMPRESSION: No skull fracture or intracranial hemorrhage. Chronic microvascular changes without CT evidence of large acute infarct. Global atrophy. Electronically Signed   By: Lacy Duverney M.D.   On: 11/26/2016 13:37    Procedures Procedures (including critical care time)  Medications Ordered in ED Medications - No data to display   Initial Impression / Assessment and Plan / ED Course  I have reviewed the triage vital signs and the nursing notes.  Pertinent labs & imaging results that were available during my care of the patient were reviewed by me and considered in my medical decision making (see chart for details).    83 year old male presenting after fall. Fall was due to his knee locking up on the left as he was trying to turn with his [place] he had to let himself down to the ground and hit his head on the dresser.  He did have a hematoma on the occiput. Labs are at his baseline and CT head negative. Reassuring exam, normal neuro. patient is very well-appearing, nontoxic and afebrile. No reason to suspect systemic illness as etiology of this fall.   Patient was discussed with Dr. Rosalia Hammers who has seen patient and agrees with  assessment and plan. Discussed strict return precautions and advised to return to the emergency department if experiencing any new or worsening symptoms. Instructions were understood and patient agreed with discharge plan. Final Clinical Impressions(s) / ED Diagnoses   Final diagnoses:  Fall, initial encounter  Contusion of other part of head, initial encounter    New Prescriptions Discharge Medication List as of 11/26/2016  2:44 PM       Georgiana Shore, PA-C 11/26/16 2218    Margarita Grizzle, MD 12/03/16 [phone]

## 2016-11-26 NOTE — ED Triage Notes (Signed)
Pt from home post fall at home. Pt states his knee gave out on him and he fell. Pt did not have LOC or dizziness. Pt did hit back of head on desk and is on a blood thinner (warfarin)

## 2016-11-26 NOTE — ED Provider Notes (Signed)
83 year old man on Coumadin and had a mechanical fall today. He struck his head and has a contusion. INR is checked and is therapeutic. CT of head reveals no evidence of intracranial hemorrhage. Otherwise patient is stable for discharge. I discussed need for follow-up and return precautions the patient and his wife and daughter and they voice understanding.  I performed a history and physical examination of [name] [name] and discussed his management with Ms. [name] [name].  I agree with the history, physical, assessment, and plan of care, with the following exceptions: None  I was present for the following procedures: None Time Spent in Critical Care of the patient: None Time spent in discussions with the patient and family: 10  [name] [name] [place], MD 11/26/16 [phone]

## 2016-11-26 NOTE — ED Notes (Signed)
Pt to ct 

## 2016-11-26 NOTE — Discharge Instructions (Signed)
As discussed, please follow up with your primary care provider.  Monitor for any worsening and return to the ER immediately if you experience nausea, vomiting, confusion, blurry vision, weakness, dizziness, a bad headache or other new concerning symptoms in the meantime.

## 2016-11-26 NOTE — ED Notes (Signed)
Got patient undress on the monitor patient is resting waiting on provider

## 2016-11-27 ENCOUNTER — Telehealth: Payer: Self-pay | Admitting: *Deleted

## 2016-11-27 ENCOUNTER — Encounter: Payer: Self-pay | Admitting: Family Medicine

## 2016-11-27 ENCOUNTER — Ambulatory Visit (INDEPENDENT_AMBULATORY_CARE_PROVIDER_SITE_OTHER): Payer: Medicare Other | Admitting: Family Medicine

## 2016-11-27 VITALS — BP 129/63 | HR 73 | Wt 211.0 lb

## 2016-11-27 DIAGNOSIS — I714 Abdominal aortic aneurysm, without rupture, unspecified: Secondary | ICD-10-CM

## 2016-11-27 DIAGNOSIS — M6281 Muscle weakness (generalized): Secondary | ICD-10-CM | POA: Diagnosis not present

## 2016-11-27 DIAGNOSIS — E039 Hypothyroidism, unspecified: Secondary | ICD-10-CM | POA: Diagnosis not present

## 2016-11-27 DIAGNOSIS — I481 Persistent atrial fibrillation: Secondary | ICD-10-CM | POA: Diagnosis not present

## 2016-11-27 DIAGNOSIS — I428 Other cardiomyopathies: Secondary | ICD-10-CM | POA: Diagnosis not present

## 2016-11-27 DIAGNOSIS — R2681 Unsteadiness on feet: Secondary | ICD-10-CM | POA: Diagnosis not present

## 2016-11-27 DIAGNOSIS — I4819 Other persistent atrial fibrillation: Secondary | ICD-10-CM

## 2016-11-27 DIAGNOSIS — I5023 Acute on chronic systolic (congestive) heart failure: Secondary | ICD-10-CM

## 2016-11-27 DIAGNOSIS — I25708 Atherosclerosis of coronary artery bypass graft(s), unspecified, with other forms of angina pectoris: Secondary | ICD-10-CM

## 2016-11-27 NOTE — Patient Instructions (Signed)
Thank you for coming in today. Recheck end of next week.  You should hear from PT today or tomorrow.  Return sooner if needed.  Call or go to the ER if you develop a large red swollen joint with extreme pain or oozing puss.

## 2016-11-27 NOTE — Telephone Encounter (Signed)
The daughter is requesting a call from Dr. Denyse Amass

## 2016-11-27 NOTE — Progress Notes (Signed)
[name] [name] is a 83 y.o. male who presents to [facility]: [place] today for follow-up recent emergency room visit. Patient fell yesterday and was seen in the emergency room yesterday. He was standing and felt his knees buckle in his legs give way refill out of the ground. He notes worsening weakness over the last several weeks. He's been seen by cardiology who suspects deconditioning. He has not yet started, physical therapy. He notes considerable bilateral knee pain due to DJD and wishes repeat injection if possible. In the emergency department CT scan and labs were unremarkable.  At home he depends partially on his wife for some activities of daily living. She also depends on him for some activities of daily living.   Past Medical History:  Diagnosis Date  . Anemia, unspecified 04/07/2013  . Atrial fibrillation (HCC)    fibrillation/flutter  . BPH (benign prostatic hypertrophy)   . Chronic systolic CHF (congestive heart failure) (HCC)    Nonischemic DCM EF 10% s/p BiV AICD  . Coronary artery disease 2008   nonobstructive ASCAD  . Depression   . Hyperlipidemia   . Hypertension   . Hypothyroidism   . Left bundle branch block   . Nonischemic cardiomyopathy (HCC)    ef 10 %  . OSA (obstructive sleep apnea)    intolerant to CPAP  . Osteoarthritis   . Sleep apnea    stopped using CPAP  . Thrombocytopenia ([place])    Past Surgical History:  Procedure Laterality Date  . CARDIAC DEFIBRILLATOR PLACEMENT     left chest  . CARDIOVERSION N/A 12/29/2012   Procedure: CARDIOVERSION;  Surgeon: Sueanne Margarita, MD;  Location: [facility] ENDOS[facility];  Service: Cardiovascular;  Laterality: N/A;  . CHOLECYSTECTOMY    . Colonscopy     reportedly negative per pt; around 2005.    . EP IMPLANTABLE DEVICE N/A 05/09/2016   Procedure: [place];  Surgeon: Deboraha Sprang, MD;   Location: [facility] CV LAB;  Service: Cardiovascular;  Laterality: N/A;  . TRANSURETHRAL RESECTION OF PROSTATE     for BPH   Social History  Substance Use Topics  . Smoking status: Never Smoker  . Smokeless tobacco: Never Used  . Alcohol use No   family history includes Cancer in his maternal uncle; Heart disease in his father; Heart failure in his mother; Stroke in his mother.  ROS as above:  Medications: Current Outpatient Prescriptions  Medication Sig Dispense Refill  . acetaminophen (TYLENOL) 650 MG CR tablet Take 1 tablet (650 mg total) by mouth every 8 (eight) hours as needed for pain. 90 tablet 3  . AMBULATORY NON FORMULARY MEDICATION Front wheel rolling [place] for use as needed due to gait instability. Dispense 1   Fax to [phone]  R26.81 1 each 0  . benzonatate (TESSALON) 200 MG capsule Take 1 capsule (200 mg total) by mouth 3 (three) times daily as needed for cough. 45 capsule 0  . DIGOX 125 MCG tablet Take 1 tablet by mouth  every other day 45 tablet 3  . levothyroxine (SYNTHROID, LEVOTHROID) 137 MCG tablet TAKE 1 TABLET BY MOUTH  DAILY BEFORE BREAKFAST 90 tablet 1  . potassium chloride SA (K-DUR,KLOR-CON) 20 MEQ tablet Take 1 tablet (20 mEq total) by mouth daily as needed (with each torsemide dose). 10 tablet 10  . ramipril (ALTACE) 2.5 MG capsule Take 2.5 mg by mouth daily.    . simvastatin (ZOCOR) 10 MG  tablet Take 1 tablet by mouth  every evening 90 tablet 2  . spironolactone (ALDACTONE) 25 MG tablet Take 0.5 tablets (12.5 mg total) by mouth daily. 45 tablet 3  . torsemide (DEMADEX) 20 MG tablet Take 1 tablet (20 mg total) by mouth daily as needed (weight gain). 30 tablet 6  . warfarin (COUMADIN) 1 MG tablet Take 1 tablet by mouth on Tuesday and Friday in addition to 5 mg tablet. 14 tablet 0  . warfarin (COUMADIN) 5 MG tablet Take 1 tablet by mouth once daily at 6PM 90 tablet 0   No current facility-administered medications for this visit.    Allergies    Allergen Reactions  . Amiodarone Other (See Comments)    Neuro toxicity    Health Maintenance Health Maintenance  Topic Date Due  . PNA vac Low Risk Adult (1 of 2 - PCV13) 11/28/2016 (Originally 05/29/1999)  . INFLUENZA VACCINE  01/23/2017  . TETANUS/TDAP  07/01/2019     Exam:  BP 129/63   Pulse 73   Wt 211 lb (95.7 kg)   SpO2 98%   BMI 25.68 kg/m  Gen: Well NAD HEENT: EOMI,  MMM Lungs: Normal work of breathing. CTABL Heart: RRR no MRG Abd: NABS, Soft. Nondistended, Nontender Exts: Brisk capillary refill, warm and well perfused.  MSK: Knees with mild effusion left worse than right. Nontender. Quadriceps strength to knee extension is 4+/5 bilaterally.  Procedure: Real-time Ultrasound Guided Injection of left knee  Device: GE Logiq E  Images permanently stored and available for review in the ultrasound unit. Verbal informed consent obtained. Discussed risks and benefits of procedure. Warned about infection bleeding damage to structures skin hypopigmentation and fat atrophy among others. Patient expresses understanding and agreement Time-out conducted.  Noted no overlying erythema, induration, or other signs of local infection.  Skin prepped in a sterile fashion.  Local anesthesia: Topical Ethyl chloride.  With sterile technique and under real time ultrasound guidance: 55m kenalog and 48mmarcaine injected easily.  Completed without difficulty  Pain immediately resolved suggesting accurate placement of the medication.  Advised to call if fevers/chills, erythema, induration, drainage, or persistent bleeding.  Images permanently stored and available for review in the ultrasound unit.  Impression: Technically successful ultrasound guided injection.  Procedure: Real-time Ultrasound Guided Injection of right knee  Device: GE Logiq E  Images permanently stored and available for review in the ultrasound unit. Verbal informed consent obtained. Discussed risks and  benefits of procedure. Warned about infection bleeding damage to structures skin hypopigmentation and fat atrophy among others. Patient expresses understanding and agreement Time-out conducted.  Noted no overlying erythema, induration, or other signs of local infection.  Skin prepped in a sterile fashion.  Local anesthesia: Topical Ethyl chloride.  With sterile technique and under real time ultrasound guidance: 8028menalog and 4ml22mrcaine injected easily.  Completed without difficulty  Pain immediately resolved suggesting accurate placement of the medication.  Advised to call if fevers/chills, erythema, induration, drainage, or persistent bleeding.  Images permanently stored and available for review in the ultrasound unit.  Impression: Technically successful ultrasound guided injection.     Results for orders placed or performed during the hospital encounter of 11/26/16 (from the past 72 hour(s))  Protime-INR     Status: Abnormal   Collection Time: 11/26/16 12:27 PM  Result Value Ref Range   Prothrombin Time 25.9 (H) 11.4 - 15.2 seconds   INR 2.33   CBC     Status: Abnormal   Collection Time: 11/26/16  12:27 PM  Result Value Ref Range   WBC 7.1 4.0 - 10.5 K/uL   RBC 4.17 (L) 4.22 - 5.81 MIL/uL   Hemoglobin 12.9 (L) 13.0 - 17.0 g/dL   HCT 39.3 39.0 - 52.0 %   MCV 94.2 78.0 - 100.0 fL   MCH 30.9 26.0 - 34.0 pg   MCHC 32.8 30.0 - 36.0 g/dL   RDW 14.5 11.5 - 15.5 %   Platelets 104 (L) 150 - 400 K/uL    Comment: REPEATED TO VERIFY PLATELET COUNT CONFIRMED BY SMEAR   Basic metabolic panel     Status: Abnormal   Collection Time: 11/26/16 12:27 PM  Result Value Ref Range   Sodium 142 135 - 145 mmol/L   Potassium 4.0 3.5 - 5.1 mmol/L   Chloride 106 101 - 111 mmol/L   CO2 28 22 - 32 mmol/L   Glucose, Bld 116 (H) 65 - 99 mg/dL   BUN 26 (H) 6 - 20 mg/dL   Creatinine, Ser 1.32 (H) 0.61 - 1.24 mg/dL   Calcium 8.9 8.9 - 10.3 mg/dL   GFR calc non Af Amer 49 (L) >60 mL/min    GFR calc Af Amer 56 (L) >60 mL/min    Comment: (NOTE) The eGFR has been calculated using the CKD EPI equation. This calculation has not been validated in all clinical situations. eGFR's persistently <60 mL/min signify possible Chronic Kidney Disease.    Anion gap 8 5 - 15   Ct Head Wo Contrast  Result Date: 11/26/2016 CLINICAL DATA:  83 year old male fell backwards striking occiput. No reported loss of consciousness. Initial encounter. EXAM: CT HEAD WITHOUT CONTRAST TECHNIQUE: Contiguous axial images were obtained from the base of the skull through the vertex without intravenous contrast. COMPARISON:  03/22/2016. FINDINGS: Brain: No intracranial hemorrhage or CT evidence of large acute infarct. Moderate chronic microvascular changes. Global atrophy without hydrocephalus. No intracranial mass lesion noted on this unenhanced exam. Vascular: Vascular calcifications. Skull: No skull fracture. Sinuses/Orbits: Post lens replacement. No acute orbital abnormality. Minimal mucosal thickening left maxillary sinus and ethmoid sinus air cells bilaterally. Other: Mastoid air cells and middle ear cavities are clear. Scarring left frontal subcutaneous region. Possible scalp injury left occipital region. IMPRESSION: No skull fracture or intracranial hemorrhage. Chronic microvascular changes without CT evidence of large acute infarct. Global atrophy. Electronically Signed   By: Genia Del M.D.   On: 11/26/2016 13:37      Assessment and Plan: 83 y.o. male with  Leg weakness likely due to deconditioning. Knee arthritis may be a component. Plan for home physical therapy.  Fundamentally Mr. Cordova does not have a [place] see home environment. I think he would be best served with assisted living facility which he declines. We will try to make this work but I concerned that today is the start of a descent to needing more skilled nursing needs.   Continue injections as needed. Additionally will check TSH and CK for  potential causes of muscle weakness. Recheck in less than 2 weeks. Return sooner if needed.  Vision has multiple cardiovascular diseases which are concerned. These are being currently, managed with cardiology  Orders Placed This Encounter  Procedures  . TSH  . CK   No orders of the defined types were placed in this encounter.    Discussed warning signs or symptoms. Please see discharge instructions. Patient expresses understanding.  I spent 40 minutes with this patient, greater than 50% was face-to-face time counseling regarding the above diagnosis.

## 2016-11-28 ENCOUNTER — Other Ambulatory Visit: Payer: Self-pay | Admitting: Cardiovascular Disease

## 2016-11-28 DIAGNOSIS — Z961 Presence of intraocular lens: Secondary | ICD-10-CM | POA: Diagnosis not present

## 2016-11-28 DIAGNOSIS — H5202 Hypermetropia, left eye: Secondary | ICD-10-CM | POA: Diagnosis not present

## 2016-11-28 DIAGNOSIS — H5211 Myopia, right eye: Secondary | ICD-10-CM | POA: Diagnosis not present

## 2016-11-28 DIAGNOSIS — H52203 Unspecified astigmatism, bilateral: Secondary | ICD-10-CM | POA: Diagnosis not present

## 2016-11-28 LAB — CK: Total CK: 118 U/L (ref 44–196)

## 2016-11-28 LAB — TSH: TSH: 1.52 mIU/L (ref 0.40–4.50)

## 2016-11-28 NOTE — Telephone Encounter (Signed)
I dont know her number. Do you have it somewhere?

## 2016-11-29 DIAGNOSIS — I714 Abdominal aortic aneurysm, without rupture: Secondary | ICD-10-CM | POA: Diagnosis not present

## 2016-11-29 DIAGNOSIS — I481 Persistent atrial fibrillation: Secondary | ICD-10-CM | POA: Diagnosis not present

## 2016-11-29 DIAGNOSIS — I2510 Atherosclerotic heart disease of native coronary artery without angina pectoris: Secondary | ICD-10-CM | POA: Diagnosis not present

## 2016-11-29 DIAGNOSIS — D631 Anemia in chronic kidney disease: Secondary | ICD-10-CM | POA: Diagnosis not present

## 2016-11-29 DIAGNOSIS — I428 Other cardiomyopathies: Secondary | ICD-10-CM | POA: Diagnosis not present

## 2016-11-29 DIAGNOSIS — M48061 Spinal stenosis, lumbar region without neurogenic claudication: Secondary | ICD-10-CM | POA: Diagnosis not present

## 2016-11-29 DIAGNOSIS — N183 Chronic kidney disease, stage 3 (moderate): Secondary | ICD-10-CM | POA: Diagnosis not present

## 2016-11-29 DIAGNOSIS — I447 Left bundle-branch block, unspecified: Secondary | ICD-10-CM | POA: Diagnosis not present

## 2016-11-29 DIAGNOSIS — I130 Hypertensive heart and chronic kidney disease with heart failure and stage 1 through stage 4 chronic kidney disease, or unspecified chronic kidney disease: Secondary | ICD-10-CM | POA: Diagnosis not present

## 2016-11-29 DIAGNOSIS — M1612 Unilateral primary osteoarthritis, left hip: Secondary | ICD-10-CM | POA: Diagnosis not present

## 2016-11-29 DIAGNOSIS — E039 Hypothyroidism, unspecified: Secondary | ICD-10-CM | POA: Diagnosis not present

## 2016-11-29 DIAGNOSIS — I5022 Chronic systolic (congestive) heart failure: Secondary | ICD-10-CM | POA: Diagnosis not present

## 2016-11-29 DIAGNOSIS — M170 Bilateral primary osteoarthritis of knee: Secondary | ICD-10-CM | POA: Diagnosis not present

## 2016-11-30 NOTE — Telephone Encounter (Signed)
That number is not valid. Is it correct?

## 2016-11-30 NOTE — Telephone Encounter (Signed)
Sorry. That was a typo. Its [phone]

## 2016-11-30 NOTE — Telephone Encounter (Signed)
Cindy's number is [phone]

## 2016-12-03 ENCOUNTER — Encounter (HOSPITAL_COMMUNITY): Payer: Self-pay

## 2016-12-03 ENCOUNTER — Ambulatory Visit (HOSPITAL_COMMUNITY)
Admission: RE | Admit: 2016-12-03 | Discharge: 2016-12-03 | Disposition: A | Discharge status: Home / Self Care | Payer: Medicare Other | Source: Ambulatory Visit | Attending: Internal Medicine | Admitting: Internal Medicine

## 2016-12-03 VITALS — BP 92/60 | HR 73 | Wt 208.1 lb

## 2016-12-03 DIAGNOSIS — I130 Hypertensive heart and chronic kidney disease with heart failure and stage 1 through stage 4 chronic kidney disease, or unspecified chronic kidney disease: Secondary | ICD-10-CM | POA: Insufficient documentation

## 2016-12-03 DIAGNOSIS — R5381 Other malaise: Secondary | ICD-10-CM

## 2016-12-03 DIAGNOSIS — I480 Paroxysmal atrial fibrillation: Secondary | ICD-10-CM | POA: Insufficient documentation

## 2016-12-03 DIAGNOSIS — R05 Cough: Secondary | ICD-10-CM | POA: Diagnosis not present

## 2016-12-03 DIAGNOSIS — Z95810 Presence of automatic (implantable) cardiac defibrillator: Secondary | ICD-10-CM | POA: Insufficient documentation

## 2016-12-03 DIAGNOSIS — N189 Chronic kidney disease, unspecified: Secondary | ICD-10-CM | POA: Insufficient documentation

## 2016-12-03 DIAGNOSIS — I2510 Atherosclerotic heart disease of native coronary artery without angina pectoris: Secondary | ICD-10-CM | POA: Diagnosis not present

## 2016-12-03 DIAGNOSIS — I447 Left bundle-branch block, unspecified: Secondary | ICD-10-CM | POA: Diagnosis not present

## 2016-12-03 DIAGNOSIS — E785 Hyperlipidemia, unspecified: Secondary | ICD-10-CM | POA: Insufficient documentation

## 2016-12-03 DIAGNOSIS — F329 Major depressive disorder, single episode, unspecified: Secondary | ICD-10-CM | POA: Insufficient documentation

## 2016-12-03 DIAGNOSIS — Z79899 Other long term (current) drug therapy: Secondary | ICD-10-CM | POA: Diagnosis not present

## 2016-12-03 DIAGNOSIS — N400 Enlarged prostate without lower urinary tract symptoms: Secondary | ICD-10-CM | POA: Insufficient documentation

## 2016-12-03 DIAGNOSIS — E039 Hypothyroidism, unspecified: Secondary | ICD-10-CM | POA: Diagnosis not present

## 2016-12-03 DIAGNOSIS — G4733 Obstructive sleep apnea (adult) (pediatric): Secondary | ICD-10-CM | POA: Diagnosis not present

## 2016-12-03 DIAGNOSIS — I429 Cardiomyopathy, unspecified: Secondary | ICD-10-CM | POA: Diagnosis not present

## 2016-12-03 DIAGNOSIS — Z7901 Long term (current) use of anticoagulants: Secondary | ICD-10-CM | POA: Diagnosis not present

## 2016-12-03 DIAGNOSIS — M1990 Unspecified osteoarthritis, unspecified site: Secondary | ICD-10-CM | POA: Diagnosis not present

## 2016-12-03 DIAGNOSIS — I5022 Chronic systolic (congestive) heart failure: Secondary | ICD-10-CM | POA: Diagnosis not present

## 2016-12-03 NOTE — Patient Instructions (Signed)
Take Torsemide 20 mg (1 tablet) once tomorrow, then continue as needed.  Follow up 4-6 weeks with Dr. Shirlee Latch.  Take all medication as prescribed the day of your appointment. Bring all medications with you to your appointment.  Do the following things EVERYDAY: 1) Weigh yourself in the morning before breakfast. Write it down and keep it in a log. 2) Take your medicines as prescribed 3) Eat low salt foods-Limit salt (sodium) to 2000 mg per day.  4) Stay as active as you can everyday 5) Limit all fluids for the day to less than 2 liters

## 2016-12-03 NOTE — Progress Notes (Signed)
Patient ID: [name] [name], male   DOB: 12/07/1931, 83 y.o.   MRN: [id number]     Advanced Heart Failure Clinic Note   Primary Cardiologist: Dr Mayford Knife PCP: Dr Arlee Muslim  HPI: [name] GANGEMI is a 83 y.o. male with PMH of A tach S/P successful DC-CV 12/29/12, CAD, PAF on chronic coumadin and amiodarone, OSA- CPAP, NICM cath 2008, chronic systolic heart failure EF 25-30% (01/2013) , S/P Medtronic CRT-D 2008 and generator changed 2012, LBBB, CRI (baseline 1.7-1.8) and Hypothyroidism.   He is S/P DC-CV 12/29/12 and initially he felt good for about a week. He has had a functional decline and only able to walk a few steps. He presented [facility] ED 01/22/13 with increased dyspnea on exertion and low extremity edema. He was started on a lasix gtt 10 mg hr. Admit weight 233 lbs. Discharge weight: 214 lbs  Pt presents today for regular follow up. At last visit coreg stopped with fatigue. No real improvement. Taking torsemide only as needed. Hasn't taken in several weeks.  Has no energy. States he sits about 99% of the time. Uses a walked to get around the house. Very unstable when walking. Has no stable. Breathing has been OK. Is having a cough, but is on tessalon pearls. Denies fevers, chills, N/V, or diarrhea. Denies palpitations. Denies lightheadedness or dizziness.   Optivol: Fluid index slightly elevated, but below threshold. Pt activity nearly non-existent. Thoracic impedence slightly below threshold.    Echo 10/31/2016 LVEF 35-40%, Mod AI, Mod MR, Severe LAE, Moderate RVH, Severe RAE, PA peak pressure 19 mm Hg  Labs 8/21: Na 132 K 4.5 Cr 1.7 (stable)  Labs 03/02/13 NA 135 Potassium 4.6 Creatinine 1.6 Dig 1.6 TSH 3.95        04/02/13: digoxin 0.8, K+ 4.5, creatinine 1.48         09/08/13 K 4.4 Creatine 1.7          Review of systems complete and found to be negative unless listed in HPI.    Past Medical History:  Diagnosis Date  . Anemia, unspecified 04/07/2013  . Atrial fibrillation (HCC)    fibrillation/flutter  . BPH (benign prostatic hypertrophy)   . Chronic systolic CHF (congestive heart failure) (HCC)    Nonischemic DCM EF 10% s/p BiV AICD  . Coronary artery disease 2008   nonobstructive ASCAD  . Depression   . Hyperlipidemia   . Hypertension   . Hypothyroidism   . Left bundle branch block   . Nonischemic cardiomyopathy (HCC)    ef 10 %  . OSA (obstructive sleep apnea)    intolerant to CPAP  . Osteoarthritis   . Sleep apnea    stopped using CPAP  . Thrombocytopenia (HCC)     Current Outpatient Prescriptions  Medication Sig Dispense Refill  . acetaminophen (TYLENOL) 650 MG CR tablet Take 1 tablet (650 mg total) by mouth every 8 (eight) hours as needed for pain. 90 tablet 3  . AMBULATORY NON FORMULARY MEDICATION Front wheel rolling Kulak for use as needed due to gait instability. Dispense 1   Fax to [phone]  R26.81 1 each 0  . benzonatate (TESSALON) 200 MG capsule Take 1 capsule (200 mg total) by mouth 3 (three) times daily as needed for cough. 45 capsule 0  . DIGOX 125 MCG tablet Take 1 tablet by mouth  every other day 45 tablet 3  . levothyroxine (SYNTHROID, LEVOTHROID) 137 MCG tablet TAKE 1 TABLET BY MOUTH  DAILY BEFORE BREAKFAST 90  tablet 1  . potassium chloride SA (K-DUR,KLOR-CON) 20 MEQ tablet Take 1 tablet (20 mEq total) by mouth daily as needed (with each torsemide dose). 10 tablet 10  . ramipril (ALTACE) 2.5 MG capsule Take 2.5 mg by mouth daily.    . simvastatin (ZOCOR) 10 MG tablet Take 1 tablet by mouth  every evening 90 tablet 2  . spironolactone (ALDACTONE) 25 MG tablet Take 0.5 tablets (12.5 mg total) by mouth daily. 45 tablet 3  . torsemide (DEMADEX) 20 MG tablet Take 1 tablet (20 mg total) by mouth daily as needed (weight gain). 30 tablet 6  . warfarin (COUMADIN) 1 MG tablet Take 1 tablet by mouth on Tuesday and Friday in addition to 5 mg tablet. 14 tablet 0  . warfarin (COUMADIN) 5 MG tablet Take 1 tablet by mouth once daily at 6PM 90  tablet 0   No current facility-administered medications for this encounter.     Vitals:   12/03/16 1454  BP: 92/60  Pulse: 73  SpO2: 95%  Weight: 208 lb 2 oz (94.4 kg)   Wt Readings from Last 3 Encounters:  12/03/16 208 lb 2 oz (94.4 kg)  11/27/16 211 lb (95.7 kg)  11/26/16 202 lb (91.6 kg)    PHYSICAL EXAM: General: Elderly and fatigued appearing. NAD.  HEENT: normal Neck: supple. JVP 8-9 cm. Carotids 2+ bilat; no bruits. No thyromegaly or nodule noted. Cor: PMI nondisplaced. RRR, No M/G/R noted Lungs: CTAB, normal effort. Abdomen: soft, non-tender, distended, no HSM. No bruits or masses. +BS  Extremities: no cyanosis, clubbing, or rash. 1+ edema 1/2 to knee. Warm to touch.  Neuro: alert & orientedx3, cranial nerves grossly intact. moves all 4 extremities w/o difficulty. Affect pleasant   ASSESSMENT & PLAN:  1) Chronic systolic HF, NICM 01/2013 EF 20%; Optivol CRT- - Echo 10/2016 LVEF 35-40%, severe LAE, Mod RVH, Severe RAE, PA peak pressure 19 mm Hg - NYHA III symptoms -> fatigue. - Volume status mildly elevated by exam and optivol.  - Again offered RHC to consider low output, continues to wish to defer at this time.  - Would continue torsemide AS NEEDED only.  Will have him take 1 dose of 20 mg tomorrow.  - Continue spironolactone 12.5 mg daily.  - No coreg with fatigue and soft pressures.  - Continue low dose lisinopril for now.  - Continue ramipril 2.5 mg daily - Reinforced fluid restriction to < 2 L daily, sodium restriction to less than 2000 mg daily, and the importance of daily weights.    2) OSA - Continue nightly CPAP 3) Atrial tach/AF- - Regular on exam, but having more AT/AF on optivol. May be related to URI. Will continue to follow. Does not seem to be directly correlated to his fatigue.   - Continue amiodarone 100 mg daily. Recent LFTs/TSH stable.  - Needs yearly eye exam - Continue INR per Dr Zachery Dauer on coumadin.  4) Deconditioning - Starts PT  tomorrow.  Take torsemide tomorrow for mild volume overload. Pt is extremely deconditioned. Starts PT tomorrow. Have also encouraged to consider ALF.   RTC 4-6 with Dr. Loralie Champagne, PA-C  2:58 PM

## 2016-12-04 ENCOUNTER — Encounter (HOSPITAL_COMMUNITY): Payer: Medicare Other

## 2016-12-05 DIAGNOSIS — N183 Chronic kidney disease, stage 3 (moderate): Secondary | ICD-10-CM | POA: Diagnosis not present

## 2016-12-05 DIAGNOSIS — I428 Other cardiomyopathies: Secondary | ICD-10-CM | POA: Diagnosis not present

## 2016-12-05 DIAGNOSIS — I714 Abdominal aortic aneurysm, without rupture: Secondary | ICD-10-CM | POA: Diagnosis not present

## 2016-12-05 DIAGNOSIS — M170 Bilateral primary osteoarthritis of knee: Secondary | ICD-10-CM | POA: Diagnosis not present

## 2016-12-05 DIAGNOSIS — M1612 Unilateral primary osteoarthritis, left hip: Secondary | ICD-10-CM | POA: Diagnosis not present

## 2016-12-05 DIAGNOSIS — M48061 Spinal stenosis, lumbar region without neurogenic claudication: Secondary | ICD-10-CM | POA: Diagnosis not present

## 2016-12-05 DIAGNOSIS — I130 Hypertensive heart and chronic kidney disease with heart failure and stage 1 through stage 4 chronic kidney disease, or unspecified chronic kidney disease: Secondary | ICD-10-CM | POA: Diagnosis not present

## 2016-12-05 DIAGNOSIS — E039 Hypothyroidism, unspecified: Secondary | ICD-10-CM | POA: Diagnosis not present

## 2016-12-05 DIAGNOSIS — I447 Left bundle-branch block, unspecified: Secondary | ICD-10-CM | POA: Diagnosis not present

## 2016-12-05 DIAGNOSIS — I481 Persistent atrial fibrillation: Secondary | ICD-10-CM | POA: Diagnosis not present

## 2016-12-05 DIAGNOSIS — D631 Anemia in chronic kidney disease: Secondary | ICD-10-CM | POA: Diagnosis not present

## 2016-12-05 DIAGNOSIS — I5022 Chronic systolic (congestive) heart failure: Secondary | ICD-10-CM | POA: Diagnosis not present

## 2016-12-05 DIAGNOSIS — I2510 Atherosclerotic heart disease of native coronary artery without angina pectoris: Secondary | ICD-10-CM | POA: Diagnosis not present

## 2016-12-06 DIAGNOSIS — I2510 Atherosclerotic heart disease of native coronary artery without angina pectoris: Secondary | ICD-10-CM | POA: Diagnosis not present

## 2016-12-06 DIAGNOSIS — I447 Left bundle-branch block, unspecified: Secondary | ICD-10-CM | POA: Diagnosis not present

## 2016-12-06 DIAGNOSIS — I714 Abdominal aortic aneurysm, without rupture: Secondary | ICD-10-CM | POA: Diagnosis not present

## 2016-12-06 DIAGNOSIS — E039 Hypothyroidism, unspecified: Secondary | ICD-10-CM | POA: Diagnosis not present

## 2016-12-06 DIAGNOSIS — I481 Persistent atrial fibrillation: Secondary | ICD-10-CM | POA: Diagnosis not present

## 2016-12-06 DIAGNOSIS — M1612 Unilateral primary osteoarthritis, left hip: Secondary | ICD-10-CM | POA: Diagnosis not present

## 2016-12-06 DIAGNOSIS — I5022 Chronic systolic (congestive) heart failure: Secondary | ICD-10-CM | POA: Diagnosis not present

## 2016-12-06 DIAGNOSIS — I130 Hypertensive heart and chronic kidney disease with heart failure and stage 1 through stage 4 chronic kidney disease, or unspecified chronic kidney disease: Secondary | ICD-10-CM | POA: Diagnosis not present

## 2016-12-06 DIAGNOSIS — I428 Other cardiomyopathies: Secondary | ICD-10-CM | POA: Diagnosis not present

## 2016-12-06 DIAGNOSIS — D631 Anemia in chronic kidney disease: Secondary | ICD-10-CM | POA: Diagnosis not present

## 2016-12-06 DIAGNOSIS — N183 Chronic kidney disease, stage 3 (moderate): Secondary | ICD-10-CM | POA: Diagnosis not present

## 2016-12-06 DIAGNOSIS — M170 Bilateral primary osteoarthritis of knee: Secondary | ICD-10-CM | POA: Diagnosis not present

## 2016-12-06 DIAGNOSIS — M48061 Spinal stenosis, lumbar region without neurogenic claudication: Secondary | ICD-10-CM | POA: Diagnosis not present

## 2016-12-07 ENCOUNTER — Encounter: Payer: Self-pay | Admitting: Family Medicine

## 2016-12-07 ENCOUNTER — Ambulatory Visit (INDEPENDENT_AMBULATORY_CARE_PROVIDER_SITE_OTHER): Payer: Medicare Other | Admitting: Family Medicine

## 2016-12-07 VITALS — BP 115/60 | HR 70 | Wt 210.0 lb

## 2016-12-07 DIAGNOSIS — I428 Other cardiomyopathies: Secondary | ICD-10-CM

## 2016-12-07 DIAGNOSIS — M1612 Unilateral primary osteoarthritis, left hip: Secondary | ICD-10-CM | POA: Diagnosis not present

## 2016-12-07 DIAGNOSIS — M170 Bilateral primary osteoarthritis of knee: Secondary | ICD-10-CM | POA: Diagnosis not present

## 2016-12-07 DIAGNOSIS — I481 Persistent atrial fibrillation: Secondary | ICD-10-CM | POA: Diagnosis not present

## 2016-12-07 DIAGNOSIS — I4819 Other persistent atrial fibrillation: Secondary | ICD-10-CM

## 2016-12-07 DIAGNOSIS — I5023 Acute on chronic systolic (congestive) heart failure: Secondary | ICD-10-CM

## 2016-12-07 NOTE — Patient Instructions (Signed)
Thank you for coming in today. You should hear about hospice services soon.  Recheck in 1 month.   Continue current medicine.   Hospice Introduction Hospice is a service that is designed to provide people who are terminally ill and their families with medical, spiritual, and psychological support. Its aim is to improve your quality of life by keeping you as alert and comfortable as possible. Who will be my providers when I begin hospice care? Hospice teams often include:  A nurse.  A doctor. The hospice doctor will be available for your care, but you can bring your regular doctor or nurse practitioner.  Social workers.  Religious leaders (such as a Clinical biochemist).  Trained volunteers.  What roles will providers play in my care? Hospice is performed by a team of health care professionals and volunteers who:  Help keep you comfortable: ? Hospice can be provided in your home or in a homelike setting. ? The hospice staff works with your family and friends to help meet your needs. ? You will enjoy the support of loved ones by receiving much of your basic care from family and friends.  Provide pain relief and manage your symptoms. The staff supply all necessary medicines and equipment.  Provide companionship when you are alone.  Allow you and your family to rest. They may do light housekeeping, prepare meals, and run errands.  Provide counseling. They will make sure your emotional, spiritual, and social needs and those of your family are being met.  Provide spiritual care: ? Spiritual care will be individualized to meet your needs and your family's needs. ? Spiritual care may involve:  Helping you look at what death means to you.  Helping you say goodbye to your family and friends.  Performing a specific religious ceremony or ritual.  When should hospice care begin? Most people who use hospice are believed to have fewer than 6 months to live.  Your family and health care  providers can help you decide when hospice services should begin.  If your condition improves, you may discontinue the program.  What should I consider before selecting a program? Most hospice programs are run by nonprofit, independent organizations. Some are affiliated with hospitals, nursing homes, or home health care agencies. Hospice programs can take place in the home or at a hospice center, hospital, or skilled nursing facility. When choosing a hospice program, ask the following questions:  What services are available to me?  What services will be offered to my loved ones?  How involved will my loved ones be?  How involved will my health care provider be?  Who makes up the hospice care team? How are they trained or screened?  How will my pain and symptoms be managed?  If my circumstances change, can the services be provided in a different setting, such as my home or in the hospital?  Is the program reviewed and licensed by the state or certified in some other way?  Where can I learn more about hospice? You can learn about existing hospice programs in your area from your health care providers. You can also read more about hospice online. The websites of the following organizations contain helpful information:  The [facility] and Palliative Care Organization [facility]).  The Hospice Association of America ([place]).  The [place].  The American Cancer Society (ACS).  Hospice Net.  This information is not intended to replace advice given to you by your health care provider. Make sure you discuss any  questions you have with your health care provider. Document Released: 09/28/2003 Document Revised: 01/26/2016 Document Reviewed: 04/21/2013 Elsevier Interactive Patient Education  2017 [employer].

## 2016-12-07 NOTE — Progress Notes (Signed)
[name] [name] is a 83 y.o. male who presents to [name]: Primary Care Sports Medicine today for follow-up weakness and knee pain. Patient has been seen several times for significant fatigue and weakness. He's been attending cardiology rehabilitation and using home health physical therapy. He notes this has helped. He notes that he continues to have many needs at home that he cannot address. For example he is trying to get a lift chair at home that may have trouble affording it. He notes the knee pain is slightly improved following injections 2 weeks ago. He notes significant limitations of his activities of daily living due to his poor mobility frequent falls and heart failure.   Past Medical History:  Diagnosis Date  . Anemia, unspecified 04/07/2013  . Atrial fibrillation (HCC)    fibrillation/flutter  . BPH (benign prostatic hypertrophy)   . Chronic systolic CHF (congestive heart failure) (HCC)    Nonischemic DCM EF 10% s/p BiV AICD  . Coronary artery disease 2008   nonobstructive ASCAD  . Depression   . Hyperlipidemia   . Hypertension   . Hypothyroidism   . Left bundle branch block   . Nonischemic cardiomyopathy (HCC)    ef 10 %  . OSA (obstructive sleep apnea)    intolerant to CPAP  . Osteoarthritis   . Sleep apnea    stopped using CPAP  . Thrombocytopenia (HCC)    Past Surgical History:  Procedure Laterality Date  . CARDIAC DEFIBRILLATOR PLACEMENT     left chest  . CARDIOVERSION N/A 12/29/2012   Procedure: CARDIOVERSION;  Surgeon: Quintella Reichert, MD;  Location: [facility] ENDOSCOPY;  Service: Cardiovascular;  Laterality: N/A;  . CHOLECYSTECTOMY    . Colonscopy     reportedly negative per pt; around 2005.    . EP IMPLANTABLE DEVICE N/A 05/09/2016   Procedure: BIVI ICD Generator Changeout;  Surgeon: Duke Salvia, MD;  Location: [facility] INVASIVE CV LAB;  Service: Cardiovascular;  Laterality:  N/A;  . TRANSURETHRAL RESECTION OF PROSTATE     for BPH   Social History  Substance Use Topics  . Smoking status: Never Smoker  . Smokeless tobacco: Never Used  . Alcohol use No   family history includes Cancer in his maternal uncle; Heart disease in his father; Heart failure in his mother; Stroke in his mother.  ROS as above:  Medications: Current Outpatient Prescriptions  Medication Sig Dispense Refill  . acetaminophen (TYLENOL) 650 MG CR tablet Take 1 tablet (650 mg total) by mouth every 8 (eight) hours as needed for pain. 90 tablet 3  . AMBULATORY NON FORMULARY MEDICATION Front wheel rolling [place] for use as needed due to gait instability. Dispense 1   Fax to [phone]  R26.81 1 each 0  . benzonatate (TESSALON) 200 MG capsule Take 1 capsule (200 mg total) by mouth 3 (three) times daily as needed for cough. 45 capsule 0  . DIGOX 125 MCG tablet Take 1 tablet by mouth  every other day 45 tablet 3  . levothyroxine (SYNTHROID, LEVOTHROID) 137 MCG tablet TAKE 1 TABLET BY MOUTH  DAILY BEFORE BREAKFAST 90 tablet 1  . potassium chloride SA (K-DUR,KLOR-CON) 20 MEQ tablet Take 1 tablet (20 mEq total) by mouth daily as needed (with each torsemide dose). 10 tablet 10  . ramipril (ALTACE) 2.5 MG capsule Take 2.5 mg by mouth daily.    . simvastatin (ZOCOR) 10 MG tablet Take 1 tablet by mouth  every evening 90 tablet  2  . spironolactone (ALDACTONE) 25 MG tablet Take 0.5 tablets (12.5 mg total) by mouth daily. 45 tablet 3  . torsemide (DEMADEX) 20 MG tablet Take 1 tablet (20 mg total) by mouth daily as needed (weight gain). 30 tablet 6  . warfarin (COUMADIN) 1 MG tablet Take 1 tablet by mouth on Tuesday and Friday in addition to 5 mg tablet. 14 tablet 0  . warfarin (COUMADIN) 5 MG tablet Take 1 tablet by mouth once daily at 6PM 90 tablet 0   No current facility-administered medications for this visit.    Allergies  Allergen Reactions  . Amiodarone Other (See Comments)    Neuro toxicity      Health Maintenance Health Maintenance  Topic Date Due  . PNA vac Low Risk Adult (1 of 2 - PCV13) 05/29/1999  . INFLUENZA VACCINE  01/23/2017  . TETANUS/TDAP  07/01/2019     Exam:  BP 115/60   Pulse 70   Wt 210 lb (95.3 kg)   BMI 25.56 kg/m  Gen: Well NAD HEENT: EOMI,  MMM Lungs: Normal work of breathing. CTABL Heart: RRR no MRG Abd: NABS, Soft. Nondistended, Nontender Exts: Brisk capillary refill, warm and well perfused. No edema bilateral lower extremities Patient was unable to ambulate today as he was sitting in a wheelchair.   No results found for this or any previous visit (from the past 72 hour(s)). No results found.    Assessment and Plan: 83 y.o. male with poor functional status due to heart failure and deconditioning. Plan to continue home physical therapy. I think hospice may be indicated at this point as he would have improved services. I think is a reasonable expectation that he has less than 6 months to live potentially with his severe heart failure. Plan refer to hospice services. Continue home health services and recheck in 1 month. Appreciate cardiology input.   Orders Placed This Encounter  Procedures  . Ambulatory referral to Hospice    Referral Priority:   Routine    Referral Type:   Consultation    Referral Reason:   Specialty Services Required    Requested Specialty:   Hospice Services    Number of Visits Requested:   1   No orders of the defined types were placed in this encounter.    Discussed warning signs or symptoms. Please see discharge instructions. Patient expresses understanding.

## 2016-12-11 DIAGNOSIS — I447 Left bundle-branch block, unspecified: Secondary | ICD-10-CM | POA: Diagnosis not present

## 2016-12-11 DIAGNOSIS — N183 Chronic kidney disease, stage 3 (moderate): Secondary | ICD-10-CM | POA: Diagnosis not present

## 2016-12-11 DIAGNOSIS — M170 Bilateral primary osteoarthritis of knee: Secondary | ICD-10-CM | POA: Diagnosis not present

## 2016-12-11 DIAGNOSIS — M48061 Spinal stenosis, lumbar region without neurogenic claudication: Secondary | ICD-10-CM | POA: Diagnosis not present

## 2016-12-11 DIAGNOSIS — I5022 Chronic systolic (congestive) heart failure: Secondary | ICD-10-CM | POA: Diagnosis not present

## 2016-12-11 DIAGNOSIS — I714 Abdominal aortic aneurysm, without rupture: Secondary | ICD-10-CM | POA: Diagnosis not present

## 2016-12-11 DIAGNOSIS — I481 Persistent atrial fibrillation: Secondary | ICD-10-CM | POA: Diagnosis not present

## 2016-12-11 DIAGNOSIS — I2510 Atherosclerotic heart disease of native coronary artery without angina pectoris: Secondary | ICD-10-CM | POA: Diagnosis not present

## 2016-12-11 DIAGNOSIS — I130 Hypertensive heart and chronic kidney disease with heart failure and stage 1 through stage 4 chronic kidney disease, or unspecified chronic kidney disease: Secondary | ICD-10-CM | POA: Diagnosis not present

## 2016-12-11 DIAGNOSIS — I428 Other cardiomyopathies: Secondary | ICD-10-CM | POA: Diagnosis not present

## 2016-12-11 DIAGNOSIS — E039 Hypothyroidism, unspecified: Secondary | ICD-10-CM | POA: Diagnosis not present

## 2016-12-11 DIAGNOSIS — D631 Anemia in chronic kidney disease: Secondary | ICD-10-CM | POA: Diagnosis not present

## 2016-12-11 DIAGNOSIS — M1612 Unilateral primary osteoarthritis, left hip: Secondary | ICD-10-CM | POA: Diagnosis not present

## 2016-12-12 DIAGNOSIS — I428 Other cardiomyopathies: Secondary | ICD-10-CM | POA: Diagnosis not present

## 2016-12-12 DIAGNOSIS — M1612 Unilateral primary osteoarthritis, left hip: Secondary | ICD-10-CM | POA: Diagnosis not present

## 2016-12-12 DIAGNOSIS — D631 Anemia in chronic kidney disease: Secondary | ICD-10-CM | POA: Diagnosis not present

## 2016-12-12 DIAGNOSIS — E039 Hypothyroidism, unspecified: Secondary | ICD-10-CM | POA: Diagnosis not present

## 2016-12-12 DIAGNOSIS — M170 Bilateral primary osteoarthritis of knee: Secondary | ICD-10-CM | POA: Diagnosis not present

## 2016-12-12 DIAGNOSIS — N183 Chronic kidney disease, stage 3 (moderate): Secondary | ICD-10-CM | POA: Diagnosis not present

## 2016-12-12 DIAGNOSIS — I714 Abdominal aortic aneurysm, without rupture: Secondary | ICD-10-CM | POA: Diagnosis not present

## 2016-12-12 DIAGNOSIS — I5022 Chronic systolic (congestive) heart failure: Secondary | ICD-10-CM | POA: Diagnosis not present

## 2016-12-12 DIAGNOSIS — I447 Left bundle-branch block, unspecified: Secondary | ICD-10-CM | POA: Diagnosis not present

## 2016-12-12 DIAGNOSIS — I481 Persistent atrial fibrillation: Secondary | ICD-10-CM | POA: Diagnosis not present

## 2016-12-12 DIAGNOSIS — M48061 Spinal stenosis, lumbar region without neurogenic claudication: Secondary | ICD-10-CM | POA: Diagnosis not present

## 2016-12-12 DIAGNOSIS — I2510 Atherosclerotic heart disease of native coronary artery without angina pectoris: Secondary | ICD-10-CM | POA: Diagnosis not present

## 2016-12-12 DIAGNOSIS — I130 Hypertensive heart and chronic kidney disease with heart failure and stage 1 through stage 4 chronic kidney disease, or unspecified chronic kidney disease: Secondary | ICD-10-CM | POA: Diagnosis not present

## 2016-12-18 ENCOUNTER — Telehealth: Payer: Self-pay

## 2016-12-18 NOTE — Telephone Encounter (Signed)
Drenda Freeze from Hospice called and stated they received a referral to come out to the patients home but they need a attending physician on file for patient. Please advise if you want to continue to be attending physician or if you would like for hospice physician to take over care. Treyvon Blahut,CMA

## 2016-12-18 NOTE — Telephone Encounter (Signed)
I will be attending physician

## 2016-12-20 DIAGNOSIS — M1612 Unilateral primary osteoarthritis, left hip: Secondary | ICD-10-CM | POA: Diagnosis not present

## 2016-12-20 DIAGNOSIS — M170 Bilateral primary osteoarthritis of knee: Secondary | ICD-10-CM | POA: Diagnosis not present

## 2016-12-20 DIAGNOSIS — I447 Left bundle-branch block, unspecified: Secondary | ICD-10-CM | POA: Diagnosis not present

## 2016-12-20 DIAGNOSIS — D631 Anemia in chronic kidney disease: Secondary | ICD-10-CM | POA: Diagnosis not present

## 2016-12-20 DIAGNOSIS — I714 Abdominal aortic aneurysm, without rupture: Secondary | ICD-10-CM | POA: Diagnosis not present

## 2016-12-20 DIAGNOSIS — I2510 Atherosclerotic heart disease of native coronary artery without angina pectoris: Secondary | ICD-10-CM | POA: Diagnosis not present

## 2016-12-20 DIAGNOSIS — I428 Other cardiomyopathies: Secondary | ICD-10-CM | POA: Diagnosis not present

## 2016-12-20 DIAGNOSIS — M48061 Spinal stenosis, lumbar region without neurogenic claudication: Secondary | ICD-10-CM | POA: Diagnosis not present

## 2016-12-20 DIAGNOSIS — I481 Persistent atrial fibrillation: Secondary | ICD-10-CM | POA: Diagnosis not present

## 2016-12-20 DIAGNOSIS — I5022 Chronic systolic (congestive) heart failure: Secondary | ICD-10-CM | POA: Diagnosis not present

## 2016-12-20 DIAGNOSIS — N183 Chronic kidney disease, stage 3 (moderate): Secondary | ICD-10-CM | POA: Diagnosis not present

## 2016-12-20 DIAGNOSIS — E039 Hypothyroidism, unspecified: Secondary | ICD-10-CM | POA: Diagnosis not present

## 2016-12-20 DIAGNOSIS — I130 Hypertensive heart and chronic kidney disease with heart failure and stage 1 through stage 4 chronic kidney disease, or unspecified chronic kidney disease: Secondary | ICD-10-CM | POA: Diagnosis not present

## 2016-12-21 NOTE — Telephone Encounter (Signed)
[name] [name] has been informed. [name],CMA

## 2016-12-23 ENCOUNTER — Encounter: Payer: Self-pay | Admitting: Family Medicine

## 2016-12-24 DIAGNOSIS — I447 Left bundle-branch block, unspecified: Secondary | ICD-10-CM | POA: Diagnosis not present

## 2016-12-24 DIAGNOSIS — M170 Bilateral primary osteoarthritis of knee: Secondary | ICD-10-CM | POA: Diagnosis not present

## 2016-12-24 DIAGNOSIS — M1612 Unilateral primary osteoarthritis, left hip: Secondary | ICD-10-CM | POA: Diagnosis not present

## 2016-12-24 DIAGNOSIS — I2510 Atherosclerotic heart disease of native coronary artery without angina pectoris: Secondary | ICD-10-CM | POA: Diagnosis not present

## 2016-12-24 DIAGNOSIS — M48061 Spinal stenosis, lumbar region without neurogenic claudication: Secondary | ICD-10-CM | POA: Diagnosis not present

## 2016-12-24 DIAGNOSIS — D631 Anemia in chronic kidney disease: Secondary | ICD-10-CM | POA: Diagnosis not present

## 2016-12-24 DIAGNOSIS — I714 Abdominal aortic aneurysm, without rupture: Secondary | ICD-10-CM | POA: Diagnosis not present

## 2016-12-24 DIAGNOSIS — N183 Chronic kidney disease, stage 3 (moderate): Secondary | ICD-10-CM | POA: Diagnosis not present

## 2016-12-24 DIAGNOSIS — I5022 Chronic systolic (congestive) heart failure: Secondary | ICD-10-CM | POA: Diagnosis not present

## 2016-12-24 DIAGNOSIS — E039 Hypothyroidism, unspecified: Secondary | ICD-10-CM | POA: Diagnosis not present

## 2016-12-24 DIAGNOSIS — I130 Hypertensive heart and chronic kidney disease with heart failure and stage 1 through stage 4 chronic kidney disease, or unspecified chronic kidney disease: Secondary | ICD-10-CM | POA: Diagnosis not present

## 2016-12-24 DIAGNOSIS — I481 Persistent atrial fibrillation: Secondary | ICD-10-CM | POA: Diagnosis not present

## 2016-12-24 DIAGNOSIS — I428 Other cardiomyopathies: Secondary | ICD-10-CM | POA: Diagnosis not present

## 2016-12-24 NOTE — Telephone Encounter (Signed)
Pt sent a my chart message asking the following:   Are unitary cather kits a a practal alternative to protective underwear? comments :   Thank you Cindie Crumbly

## 2016-12-24 NOTE — Telephone Encounter (Signed)
Pt sent a my chart message asking the following:   Are unitary cather kits a a practal alternative to protective underwear? comments :   Thank you Tajee Null   

## 2016-12-26 DIAGNOSIS — E039 Hypothyroidism, unspecified: Secondary | ICD-10-CM | POA: Diagnosis not present

## 2016-12-26 DIAGNOSIS — I447 Left bundle-branch block, unspecified: Secondary | ICD-10-CM | POA: Diagnosis not present

## 2016-12-26 DIAGNOSIS — N183 Chronic kidney disease, stage 3 (moderate): Secondary | ICD-10-CM | POA: Diagnosis not present

## 2016-12-26 DIAGNOSIS — M1612 Unilateral primary osteoarthritis, left hip: Secondary | ICD-10-CM | POA: Diagnosis not present

## 2016-12-26 DIAGNOSIS — I714 Abdominal aortic aneurysm, without rupture: Secondary | ICD-10-CM | POA: Diagnosis not present

## 2016-12-26 DIAGNOSIS — I428 Other cardiomyopathies: Secondary | ICD-10-CM | POA: Diagnosis not present

## 2016-12-26 DIAGNOSIS — I5022 Chronic systolic (congestive) heart failure: Secondary | ICD-10-CM | POA: Diagnosis not present

## 2016-12-26 DIAGNOSIS — I130 Hypertensive heart and chronic kidney disease with heart failure and stage 1 through stage 4 chronic kidney disease, or unspecified chronic kidney disease: Secondary | ICD-10-CM | POA: Diagnosis not present

## 2016-12-26 DIAGNOSIS — D631 Anemia in chronic kidney disease: Secondary | ICD-10-CM | POA: Diagnosis not present

## 2016-12-26 DIAGNOSIS — I481 Persistent atrial fibrillation: Secondary | ICD-10-CM | POA: Diagnosis not present

## 2016-12-26 DIAGNOSIS — M48061 Spinal stenosis, lumbar region without neurogenic claudication: Secondary | ICD-10-CM | POA: Diagnosis not present

## 2016-12-26 DIAGNOSIS — I2510 Atherosclerotic heart disease of native coronary artery without angina pectoris: Secondary | ICD-10-CM | POA: Diagnosis not present

## 2016-12-26 DIAGNOSIS — M170 Bilateral primary osteoarthritis of knee: Secondary | ICD-10-CM | POA: Diagnosis not present

## 2017-01-01 DIAGNOSIS — D631 Anemia in chronic kidney disease: Secondary | ICD-10-CM | POA: Diagnosis not present

## 2017-01-01 DIAGNOSIS — M170 Bilateral primary osteoarthritis of knee: Secondary | ICD-10-CM | POA: Diagnosis not present

## 2017-01-01 DIAGNOSIS — I714 Abdominal aortic aneurysm, without rupture: Secondary | ICD-10-CM | POA: Diagnosis not present

## 2017-01-01 DIAGNOSIS — M48061 Spinal stenosis, lumbar region without neurogenic claudication: Secondary | ICD-10-CM | POA: Diagnosis not present

## 2017-01-01 DIAGNOSIS — Z7901 Long term (current) use of anticoagulants: Secondary | ICD-10-CM | POA: Diagnosis not present

## 2017-01-01 DIAGNOSIS — I5022 Chronic systolic (congestive) heart failure: Secondary | ICD-10-CM | POA: Diagnosis not present

## 2017-01-01 DIAGNOSIS — I428 Other cardiomyopathies: Secondary | ICD-10-CM | POA: Diagnosis not present

## 2017-01-01 DIAGNOSIS — I130 Hypertensive heart and chronic kidney disease with heart failure and stage 1 through stage 4 chronic kidney disease, or unspecified chronic kidney disease: Secondary | ICD-10-CM | POA: Diagnosis not present

## 2017-01-01 DIAGNOSIS — N400 Enlarged prostate without lower urinary tract symptoms: Secondary | ICD-10-CM | POA: Diagnosis not present

## 2017-01-01 DIAGNOSIS — I447 Left bundle-branch block, unspecified: Secondary | ICD-10-CM | POA: Diagnosis not present

## 2017-01-01 DIAGNOSIS — I2510 Atherosclerotic heart disease of native coronary artery without angina pectoris: Secondary | ICD-10-CM | POA: Diagnosis not present

## 2017-01-01 DIAGNOSIS — Z95810 Presence of automatic (implantable) cardiac defibrillator: Secondary | ICD-10-CM | POA: Diagnosis not present

## 2017-01-01 DIAGNOSIS — M1612 Unilateral primary osteoarthritis, left hip: Secondary | ICD-10-CM | POA: Diagnosis not present

## 2017-01-01 DIAGNOSIS — N183 Chronic kidney disease, stage 3 (moderate): Secondary | ICD-10-CM | POA: Diagnosis not present

## 2017-01-01 DIAGNOSIS — Z5181 Encounter for therapeutic drug level monitoring: Secondary | ICD-10-CM | POA: Diagnosis not present

## 2017-01-01 DIAGNOSIS — E039 Hypothyroidism, unspecified: Secondary | ICD-10-CM | POA: Diagnosis not present

## 2017-01-01 DIAGNOSIS — I481 Persistent atrial fibrillation: Secondary | ICD-10-CM | POA: Diagnosis not present

## 2017-01-03 ENCOUNTER — Encounter: Payer: Self-pay | Admitting: Family Medicine

## 2017-01-03 ENCOUNTER — Ambulatory Visit (INDEPENDENT_AMBULATORY_CARE_PROVIDER_SITE_OTHER): Payer: Medicare Other | Admitting: Family Medicine

## 2017-01-03 ENCOUNTER — Ambulatory Visit: Payer: Medicare Other | Admitting: Family Medicine

## 2017-01-03 VITALS — BP 119/56 | HR 97 | Wt 209.0 lb

## 2017-01-03 DIAGNOSIS — R0609 Other forms of dyspnea: Secondary | ICD-10-CM

## 2017-01-03 DIAGNOSIS — I428 Other cardiomyopathies: Secondary | ICD-10-CM

## 2017-01-03 DIAGNOSIS — R3981 Functional urinary incontinence: Secondary | ICD-10-CM

## 2017-01-03 DIAGNOSIS — M6281 Muscle weakness (generalized): Secondary | ICD-10-CM | POA: Diagnosis not present

## 2017-01-03 DIAGNOSIS — I4819 Other persistent atrial fibrillation: Secondary | ICD-10-CM

## 2017-01-03 DIAGNOSIS — I481 Persistent atrial fibrillation: Secondary | ICD-10-CM

## 2017-01-03 LAB — POCT INR: INR: 2

## 2017-01-03 NOTE — Patient Instructions (Signed)
Thank you for coming in today. Continue medicines.  Recheck in 1 month with Barbara's appointment.  Work really hard with physical therapy.  Consider assisted living.  Return sooner if needed.

## 2017-01-03 NOTE — Progress Notes (Signed)
[name] [name] is a 83 y.o. male who presents to [name]: Primary Care Sports Medicine today for follow up deconditioning.   Siris has been about the same in the last month. He continues to have difficulty with ambulation. He is currently receiving home health physical therapy services which help a little. He did not think that hospice services were helpful and has canceled. He has not changed any of his medications recently and takes the medications listed below. He denies chest pain palpitations shortness of breath.  Additionally he notes continued post urinary dribbling and urinary incontinence which he finds obnoxious. This is been ongoing for years. He is started using adult diapers which help a lot.   Past Medical History:  Diagnosis Date  . Anemia, unspecified 04/07/2013  . Atrial fibrillation (HCC)    fibrillation/flutter  . BPH (benign prostatic hypertrophy)   . Chronic systolic CHF (congestive heart failure) (HCC)    Nonischemic DCM EF 10% s/p BiV AICD  . Coronary artery disease 2008   nonobstructive ASCAD  . Depression   . Hyperlipidemia   . Hypertension   . Hypothyroidism   . Left bundle branch block   . Nonischemic cardiomyopathy (HCC)    ef 10 %  . OSA (obstructive sleep apnea)    intolerant to CPAP  . Osteoarthritis   . Sleep apnea    stopped using CPAP  . Thrombocytopenia (HCC)    Past Surgical History:  Procedure Laterality Date  . CARDIAC DEFIBRILLATOR PLACEMENT     left chest  . CARDIOVERSION N/A 12/29/2012   Procedure: CARDIOVERSION;  Surgeon: Quintella Reichert, MD;  Location: [facility] ENDOSCOPY;  Service: Cardiovascular;  Laterality: N/A;  . CHOLECYSTECTOMY    . Colonscopy     reportedly negative per pt; around 2005.    . EP IMPLANTABLE DEVICE N/A 05/09/2016   Procedure: BIVI ICD Generator Changeout;  Surgeon: Duke Salvia, MD;  Location: [facility] INVASIVE CV LAB;   Service: Cardiovascular;  Laterality: N/A;  . TRANSURETHRAL RESECTION OF PROSTATE     for BPH   Social History  Substance Use Topics  . Smoking status: Never Smoker  . Smokeless tobacco: Never Used  . Alcohol use No   family history includes Cancer in his maternal uncle; Heart disease in his father; Heart failure in his mother; Stroke in his mother.  ROS as above:  Medications: Current Outpatient Prescriptions  Medication Sig Dispense Refill  . acetaminophen (TYLENOL) 650 MG CR tablet Take 1 tablet (650 mg total) by mouth every 8 (eight) hours as needed for pain. 90 tablet 3  . AMBULATORY NON FORMULARY MEDICATION Front wheel rolling [place] for use as needed due to gait instability. Dispense 1   Fax to [phone]  R26.81 1 each 0  . benzonatate (TESSALON) 200 MG capsule Take 1 capsule (200 mg total) by mouth 3 (three) times daily as needed for cough. 45 capsule 0  . DIGOX 125 MCG tablet Take 1 tablet by mouth  every other day 45 tablet 3  . levothyroxine (SYNTHROID, LEVOTHROID) 137 MCG tablet TAKE 1 TABLET BY MOUTH  DAILY BEFORE BREAKFAST 90 tablet 1  . potassium chloride SA (K-DUR,KLOR-CON) 20 MEQ tablet Take 1 tablet (20 mEq total) by mouth daily as needed (with each torsemide dose). 10 tablet 10  . ramipril (ALTACE) 2.5 MG capsule Take 2.5 mg by mouth daily.    . simvastatin (ZOCOR) 10 MG tablet Take 1 tablet by mouth  every  evening 90 tablet 2  . spironolactone (ALDACTONE) 25 MG tablet Take 0.5 tablets (12.5 mg total) by mouth daily. 45 tablet 3  . torsemide (DEMADEX) 20 MG tablet Take 1 tablet (20 mg total) by mouth daily as needed (weight gain). 30 tablet 6  . warfarin (COUMADIN) 1 MG tablet Take 1 tablet by mouth on Tuesday and Friday in addition to 5 mg tablet. 14 tablet 0  . warfarin (COUMADIN) 5 MG tablet Take 1 tablet by mouth once daily at 6PM 90 tablet 0   No current facility-administered medications for this visit.    Allergies  Allergen Reactions  . Amiodarone  Other (See Comments)    Neuro toxicity    Health Maintenance Health Maintenance  Topic Date Due  . PNA vac Low Risk Adult (1 of 2 - PCV13) 05/29/1999  . INFLUENZA VACCINE  01/23/2017  . TETANUS/TDAP  07/01/2019     Exam:  BP (!) 119/56   Pulse 97   Wt 209 lb (94.8 kg)   SpO2 97%   BMI 25.44 kg/m  Gen: Well NAD HEENT: EOMI,  MMM Lungs: Normal work of breathing. CTABL Heart: RRR no MRG Abd: NABS, Soft. Nondistended, Nontender Exts: Brisk capillary refill, warm and well perfused. No edema   Results for orders placed or performed in visit on 01/03/17 (from the past 72 hour(s))  POCT INR     Status: Normal   Collection Time: 01/03/17 11:26 AM  Result Value Ref Range   INR 2.0    No results found.    Assessment and Plan: 83 y.o. male with Deconditioning: Multifactorial. Likely cardiac dysfunction as a cause as his cytopenia. Plan to continue home physical therapy and work with cardiology services. Plan to recheck in 1 month. Ultimately he may benefit from assisted living facility.  Anticoagulation due to atrial fibrillation. Patient continues do well with warfarin. INR 2 today. Continue current doses.  Incontinence: Stable. Continue to follow along.   Orders Placed This Encounter  Procedures  . POCT INR   No orders of the defined types were placed in this encounter.    Discussed warning signs or symptoms. Please see discharge instructions. Patient expresses understanding.

## 2017-01-08 DIAGNOSIS — M1612 Unilateral primary osteoarthritis, left hip: Secondary | ICD-10-CM | POA: Diagnosis not present

## 2017-01-08 DIAGNOSIS — I5022 Chronic systolic (congestive) heart failure: Secondary | ICD-10-CM | POA: Diagnosis not present

## 2017-01-08 DIAGNOSIS — M170 Bilateral primary osteoarthritis of knee: Secondary | ICD-10-CM | POA: Diagnosis not present

## 2017-01-08 DIAGNOSIS — E039 Hypothyroidism, unspecified: Secondary | ICD-10-CM | POA: Diagnosis not present

## 2017-01-08 DIAGNOSIS — M48061 Spinal stenosis, lumbar region without neurogenic claudication: Secondary | ICD-10-CM | POA: Diagnosis not present

## 2017-01-08 DIAGNOSIS — I481 Persistent atrial fibrillation: Secondary | ICD-10-CM | POA: Diagnosis not present

## 2017-01-08 DIAGNOSIS — I714 Abdominal aortic aneurysm, without rupture: Secondary | ICD-10-CM | POA: Diagnosis not present

## 2017-01-08 DIAGNOSIS — I130 Hypertensive heart and chronic kidney disease with heart failure and stage 1 through stage 4 chronic kidney disease, or unspecified chronic kidney disease: Secondary | ICD-10-CM | POA: Diagnosis not present

## 2017-01-08 DIAGNOSIS — N183 Chronic kidney disease, stage 3 (moderate): Secondary | ICD-10-CM | POA: Diagnosis not present

## 2017-01-08 DIAGNOSIS — D631 Anemia in chronic kidney disease: Secondary | ICD-10-CM | POA: Diagnosis not present

## 2017-01-08 DIAGNOSIS — I428 Other cardiomyopathies: Secondary | ICD-10-CM | POA: Diagnosis not present

## 2017-01-08 DIAGNOSIS — I2510 Atherosclerotic heart disease of native coronary artery without angina pectoris: Secondary | ICD-10-CM | POA: Diagnosis not present

## 2017-01-08 DIAGNOSIS — I447 Left bundle-branch block, unspecified: Secondary | ICD-10-CM | POA: Diagnosis not present

## 2017-01-10 DIAGNOSIS — N183 Chronic kidney disease, stage 3 (moderate): Secondary | ICD-10-CM | POA: Diagnosis not present

## 2017-01-10 DIAGNOSIS — I2510 Atherosclerotic heart disease of native coronary artery without angina pectoris: Secondary | ICD-10-CM | POA: Diagnosis not present

## 2017-01-10 DIAGNOSIS — I481 Persistent atrial fibrillation: Secondary | ICD-10-CM | POA: Diagnosis not present

## 2017-01-10 DIAGNOSIS — E039 Hypothyroidism, unspecified: Secondary | ICD-10-CM | POA: Diagnosis not present

## 2017-01-10 DIAGNOSIS — M48061 Spinal stenosis, lumbar region without neurogenic claudication: Secondary | ICD-10-CM | POA: Diagnosis not present

## 2017-01-10 DIAGNOSIS — I130 Hypertensive heart and chronic kidney disease with heart failure and stage 1 through stage 4 chronic kidney disease, or unspecified chronic kidney disease: Secondary | ICD-10-CM | POA: Diagnosis not present

## 2017-01-10 DIAGNOSIS — M170 Bilateral primary osteoarthritis of knee: Secondary | ICD-10-CM | POA: Diagnosis not present

## 2017-01-10 DIAGNOSIS — I447 Left bundle-branch block, unspecified: Secondary | ICD-10-CM | POA: Diagnosis not present

## 2017-01-10 DIAGNOSIS — D631 Anemia in chronic kidney disease: Secondary | ICD-10-CM | POA: Diagnosis not present

## 2017-01-10 DIAGNOSIS — M1612 Unilateral primary osteoarthritis, left hip: Secondary | ICD-10-CM | POA: Diagnosis not present

## 2017-01-10 DIAGNOSIS — I714 Abdominal aortic aneurysm, without rupture: Secondary | ICD-10-CM | POA: Diagnosis not present

## 2017-01-10 DIAGNOSIS — I5022 Chronic systolic (congestive) heart failure: Secondary | ICD-10-CM | POA: Diagnosis not present

## 2017-01-10 DIAGNOSIS — I428 Other cardiomyopathies: Secondary | ICD-10-CM | POA: Diagnosis not present

## 2017-01-11 ENCOUNTER — Other Ambulatory Visit: Payer: Self-pay | Admitting: Family Medicine

## 2017-01-11 NOTE — Telephone Encounter (Signed)
Is pt still coming here for INR checks? Is this refill appropriate?

## 2017-01-14 DIAGNOSIS — M48061 Spinal stenosis, lumbar region without neurogenic claudication: Secondary | ICD-10-CM | POA: Diagnosis not present

## 2017-01-14 DIAGNOSIS — N183 Chronic kidney disease, stage 3 (moderate): Secondary | ICD-10-CM | POA: Diagnosis not present

## 2017-01-14 DIAGNOSIS — I481 Persistent atrial fibrillation: Secondary | ICD-10-CM | POA: Diagnosis not present

## 2017-01-14 DIAGNOSIS — D631 Anemia in chronic kidney disease: Secondary | ICD-10-CM | POA: Diagnosis not present

## 2017-01-14 DIAGNOSIS — I447 Left bundle-branch block, unspecified: Secondary | ICD-10-CM | POA: Diagnosis not present

## 2017-01-14 DIAGNOSIS — I130 Hypertensive heart and chronic kidney disease with heart failure and stage 1 through stage 4 chronic kidney disease, or unspecified chronic kidney disease: Secondary | ICD-10-CM | POA: Diagnosis not present

## 2017-01-14 DIAGNOSIS — I714 Abdominal aortic aneurysm, without rupture: Secondary | ICD-10-CM | POA: Diagnosis not present

## 2017-01-14 DIAGNOSIS — M170 Bilateral primary osteoarthritis of knee: Secondary | ICD-10-CM | POA: Diagnosis not present

## 2017-01-14 DIAGNOSIS — I5022 Chronic systolic (congestive) heart failure: Secondary | ICD-10-CM | POA: Diagnosis not present

## 2017-01-14 DIAGNOSIS — I428 Other cardiomyopathies: Secondary | ICD-10-CM | POA: Diagnosis not present

## 2017-01-14 DIAGNOSIS — I2510 Atherosclerotic heart disease of native coronary artery without angina pectoris: Secondary | ICD-10-CM | POA: Diagnosis not present

## 2017-01-14 DIAGNOSIS — M1612 Unilateral primary osteoarthritis, left hip: Secondary | ICD-10-CM | POA: Diagnosis not present

## 2017-01-14 DIAGNOSIS — E039 Hypothyroidism, unspecified: Secondary | ICD-10-CM | POA: Diagnosis not present

## 2017-01-16 DIAGNOSIS — D631 Anemia in chronic kidney disease: Secondary | ICD-10-CM | POA: Diagnosis not present

## 2017-01-16 DIAGNOSIS — E039 Hypothyroidism, unspecified: Secondary | ICD-10-CM | POA: Diagnosis not present

## 2017-01-16 DIAGNOSIS — I2510 Atherosclerotic heart disease of native coronary artery without angina pectoris: Secondary | ICD-10-CM | POA: Diagnosis not present

## 2017-01-16 DIAGNOSIS — M1612 Unilateral primary osteoarthritis, left hip: Secondary | ICD-10-CM | POA: Diagnosis not present

## 2017-01-16 DIAGNOSIS — I714 Abdominal aortic aneurysm, without rupture: Secondary | ICD-10-CM | POA: Diagnosis not present

## 2017-01-16 DIAGNOSIS — I481 Persistent atrial fibrillation: Secondary | ICD-10-CM | POA: Diagnosis not present

## 2017-01-16 DIAGNOSIS — I428 Other cardiomyopathies: Secondary | ICD-10-CM | POA: Diagnosis not present

## 2017-01-16 DIAGNOSIS — I130 Hypertensive heart and chronic kidney disease with heart failure and stage 1 through stage 4 chronic kidney disease, or unspecified chronic kidney disease: Secondary | ICD-10-CM | POA: Diagnosis not present

## 2017-01-16 DIAGNOSIS — I5022 Chronic systolic (congestive) heart failure: Secondary | ICD-10-CM | POA: Diagnosis not present

## 2017-01-16 DIAGNOSIS — M170 Bilateral primary osteoarthritis of knee: Secondary | ICD-10-CM | POA: Diagnosis not present

## 2017-01-16 DIAGNOSIS — N183 Chronic kidney disease, stage 3 (moderate): Secondary | ICD-10-CM | POA: Diagnosis not present

## 2017-01-16 DIAGNOSIS — M48061 Spinal stenosis, lumbar region without neurogenic claudication: Secondary | ICD-10-CM | POA: Diagnosis not present

## 2017-01-16 DIAGNOSIS — I447 Left bundle-branch block, unspecified: Secondary | ICD-10-CM | POA: Diagnosis not present

## 2017-01-21 DIAGNOSIS — I2510 Atherosclerotic heart disease of native coronary artery without angina pectoris: Secondary | ICD-10-CM | POA: Diagnosis not present

## 2017-01-21 DIAGNOSIS — M48061 Spinal stenosis, lumbar region without neurogenic claudication: Secondary | ICD-10-CM | POA: Diagnosis not present

## 2017-01-21 DIAGNOSIS — E039 Hypothyroidism, unspecified: Secondary | ICD-10-CM | POA: Diagnosis not present

## 2017-01-21 DIAGNOSIS — M170 Bilateral primary osteoarthritis of knee: Secondary | ICD-10-CM | POA: Diagnosis not present

## 2017-01-21 DIAGNOSIS — I481 Persistent atrial fibrillation: Secondary | ICD-10-CM | POA: Diagnosis not present

## 2017-01-21 DIAGNOSIS — I447 Left bundle-branch block, unspecified: Secondary | ICD-10-CM | POA: Diagnosis not present

## 2017-01-21 DIAGNOSIS — I5022 Chronic systolic (congestive) heart failure: Secondary | ICD-10-CM | POA: Diagnosis not present

## 2017-01-21 DIAGNOSIS — N183 Chronic kidney disease, stage 3 (moderate): Secondary | ICD-10-CM | POA: Diagnosis not present

## 2017-01-21 DIAGNOSIS — I714 Abdominal aortic aneurysm, without rupture: Secondary | ICD-10-CM | POA: Diagnosis not present

## 2017-01-21 DIAGNOSIS — M1612 Unilateral primary osteoarthritis, left hip: Secondary | ICD-10-CM | POA: Diagnosis not present

## 2017-01-21 DIAGNOSIS — D631 Anemia in chronic kidney disease: Secondary | ICD-10-CM | POA: Diagnosis not present

## 2017-01-21 DIAGNOSIS — I130 Hypertensive heart and chronic kidney disease with heart failure and stage 1 through stage 4 chronic kidney disease, or unspecified chronic kidney disease: Secondary | ICD-10-CM | POA: Diagnosis not present

## 2017-01-21 DIAGNOSIS — I428 Other cardiomyopathies: Secondary | ICD-10-CM | POA: Diagnosis not present

## 2017-01-22 ENCOUNTER — Other Ambulatory Visit: Payer: Self-pay

## 2017-01-23 ENCOUNTER — Telehealth: Payer: Self-pay | Admitting: *Deleted

## 2017-01-23 ENCOUNTER — Encounter (HOSPITAL_COMMUNITY): Payer: Medicare Other | Admitting: Cardiology

## 2017-01-23 DIAGNOSIS — M48061 Spinal stenosis, lumbar region without neurogenic claudication: Secondary | ICD-10-CM | POA: Diagnosis not present

## 2017-01-23 DIAGNOSIS — I130 Hypertensive heart and chronic kidney disease with heart failure and stage 1 through stage 4 chronic kidney disease, or unspecified chronic kidney disease: Secondary | ICD-10-CM | POA: Diagnosis not present

## 2017-01-23 DIAGNOSIS — I447 Left bundle-branch block, unspecified: Secondary | ICD-10-CM | POA: Diagnosis not present

## 2017-01-23 DIAGNOSIS — I481 Persistent atrial fibrillation: Secondary | ICD-10-CM | POA: Diagnosis not present

## 2017-01-23 DIAGNOSIS — I5022 Chronic systolic (congestive) heart failure: Secondary | ICD-10-CM | POA: Diagnosis not present

## 2017-01-23 DIAGNOSIS — M170 Bilateral primary osteoarthritis of knee: Secondary | ICD-10-CM | POA: Diagnosis not present

## 2017-01-23 DIAGNOSIS — E039 Hypothyroidism, unspecified: Secondary | ICD-10-CM | POA: Diagnosis not present

## 2017-01-23 DIAGNOSIS — I428 Other cardiomyopathies: Secondary | ICD-10-CM | POA: Diagnosis not present

## 2017-01-23 DIAGNOSIS — D631 Anemia in chronic kidney disease: Secondary | ICD-10-CM | POA: Diagnosis not present

## 2017-01-23 DIAGNOSIS — I714 Abdominal aortic aneurysm, without rupture: Secondary | ICD-10-CM | POA: Diagnosis not present

## 2017-01-23 DIAGNOSIS — M1612 Unilateral primary osteoarthritis, left hip: Secondary | ICD-10-CM | POA: Diagnosis not present

## 2017-01-23 DIAGNOSIS — N183 Chronic kidney disease, stage 3 (moderate): Secondary | ICD-10-CM | POA: Diagnosis not present

## 2017-01-23 DIAGNOSIS — I2510 Atherosclerotic heart disease of native coronary artery without angina pectoris: Secondary | ICD-10-CM | POA: Diagnosis not present

## 2017-01-23 NOTE — Telephone Encounter (Signed)
Verbal orders given to extend PT and home health for another 4 weeks and add occupational therapy to PT from [facility]

## 2017-01-28 DIAGNOSIS — M170 Bilateral primary osteoarthritis of knee: Secondary | ICD-10-CM | POA: Diagnosis not present

## 2017-01-28 DIAGNOSIS — I5022 Chronic systolic (congestive) heart failure: Secondary | ICD-10-CM | POA: Diagnosis not present

## 2017-01-28 DIAGNOSIS — N183 Chronic kidney disease, stage 3 (moderate): Secondary | ICD-10-CM | POA: Diagnosis not present

## 2017-01-28 DIAGNOSIS — I714 Abdominal aortic aneurysm, without rupture: Secondary | ICD-10-CM | POA: Diagnosis not present

## 2017-01-28 DIAGNOSIS — M48061 Spinal stenosis, lumbar region without neurogenic claudication: Secondary | ICD-10-CM | POA: Diagnosis not present

## 2017-01-28 DIAGNOSIS — M1612 Unilateral primary osteoarthritis, left hip: Secondary | ICD-10-CM | POA: Diagnosis not present

## 2017-01-28 DIAGNOSIS — I2510 Atherosclerotic heart disease of native coronary artery without angina pectoris: Secondary | ICD-10-CM | POA: Diagnosis not present

## 2017-01-28 DIAGNOSIS — I481 Persistent atrial fibrillation: Secondary | ICD-10-CM | POA: Diagnosis not present

## 2017-01-28 DIAGNOSIS — E039 Hypothyroidism, unspecified: Secondary | ICD-10-CM | POA: Diagnosis not present

## 2017-01-28 DIAGNOSIS — I447 Left bundle-branch block, unspecified: Secondary | ICD-10-CM | POA: Diagnosis not present

## 2017-01-28 DIAGNOSIS — D631 Anemia in chronic kidney disease: Secondary | ICD-10-CM | POA: Diagnosis not present

## 2017-01-28 DIAGNOSIS — I130 Hypertensive heart and chronic kidney disease with heart failure and stage 1 through stage 4 chronic kidney disease, or unspecified chronic kidney disease: Secondary | ICD-10-CM | POA: Diagnosis not present

## 2017-01-28 DIAGNOSIS — I428 Other cardiomyopathies: Secondary | ICD-10-CM | POA: Diagnosis not present

## 2017-01-29 ENCOUNTER — Other Ambulatory Visit: Payer: Self-pay | Admitting: Internal Medicine

## 2017-01-29 NOTE — Telephone Encounter (Signed)
This is a CHF pt 

## 2017-01-30 ENCOUNTER — Ambulatory Visit (HOSPITAL_COMMUNITY)
Admission: RE | Admit: 2017-01-30 | Discharge: 2017-01-30 | Disposition: A | Discharge status: Home / Self Care | Payer: Medicare Other | Source: Ambulatory Visit | Attending: Cardiology | Admitting: Cardiology

## 2017-01-30 ENCOUNTER — Encounter (HOSPITAL_COMMUNITY): Payer: Self-pay | Admitting: Cardiology

## 2017-01-30 VITALS — BP 118/67 | HR 70 | Wt 210.5 lb

## 2017-01-30 DIAGNOSIS — G4733 Obstructive sleep apnea (adult) (pediatric): Secondary | ICD-10-CM | POA: Insufficient documentation

## 2017-01-30 DIAGNOSIS — I5022 Chronic systolic (congestive) heart failure: Secondary | ICD-10-CM | POA: Insufficient documentation

## 2017-01-30 DIAGNOSIS — E039 Hypothyroidism, unspecified: Secondary | ICD-10-CM | POA: Insufficient documentation

## 2017-01-30 DIAGNOSIS — F329 Major depressive disorder, single episode, unspecified: Secondary | ICD-10-CM | POA: Diagnosis not present

## 2017-01-30 DIAGNOSIS — E785 Hyperlipidemia, unspecified: Secondary | ICD-10-CM | POA: Insufficient documentation

## 2017-01-30 DIAGNOSIS — Z79899 Other long term (current) drug therapy: Secondary | ICD-10-CM | POA: Insufficient documentation

## 2017-01-30 DIAGNOSIS — I447 Left bundle-branch block, unspecified: Secondary | ICD-10-CM | POA: Diagnosis not present

## 2017-01-30 DIAGNOSIS — I480 Paroxysmal atrial fibrillation: Secondary | ICD-10-CM | POA: Diagnosis not present

## 2017-01-30 DIAGNOSIS — Z95810 Presence of automatic (implantable) cardiac defibrillator: Secondary | ICD-10-CM | POA: Insufficient documentation

## 2017-01-30 DIAGNOSIS — Z7901 Long term (current) use of anticoagulants: Secondary | ICD-10-CM | POA: Diagnosis not present

## 2017-01-30 DIAGNOSIS — I2510 Atherosclerotic heart disease of native coronary artery without angina pectoris: Secondary | ICD-10-CM | POA: Insufficient documentation

## 2017-01-30 DIAGNOSIS — M1990 Unspecified osteoarthritis, unspecified site: Secondary | ICD-10-CM | POA: Diagnosis not present

## 2017-01-30 DIAGNOSIS — I130 Hypertensive heart and chronic kidney disease with heart failure and stage 1 through stage 4 chronic kidney disease, or unspecified chronic kidney disease: Secondary | ICD-10-CM | POA: Insufficient documentation

## 2017-01-30 DIAGNOSIS — N400 Enlarged prostate without lower urinary tract symptoms: Secondary | ICD-10-CM | POA: Diagnosis not present

## 2017-01-30 DIAGNOSIS — I428 Other cardiomyopathies: Secondary | ICD-10-CM | POA: Insufficient documentation

## 2017-01-30 DIAGNOSIS — N189 Chronic kidney disease, unspecified: Secondary | ICD-10-CM | POA: Diagnosis not present

## 2017-01-30 DIAGNOSIS — R000 Tachycardia, unspecified: Secondary | ICD-10-CM | POA: Insufficient documentation

## 2017-01-30 LAB — CBC
HEMATOCRIT: 38.6 % — AB (ref 39.0–52.0)
Hemoglobin: 12.7 g/dL — ABNORMAL LOW (ref 13.0–17.0)
MCH: 30.2 pg (ref 26.0–34.0)
MCHC: 32.9 g/dL (ref 30.0–36.0)
MCV: 91.9 fL (ref 78.0–100.0)
PLATELETS: 131 10*3/uL — AB (ref 150–400)
RBC: 4.2 MIL/uL — AB (ref 4.22–5.81)
RDW: 14.2 % (ref 11.5–15.5)
WBC: 5.2 10*3/uL (ref 4.0–10.5)

## 2017-01-30 LAB — BASIC METABOLIC PANEL
ANION GAP: 8 (ref 5–15)
BUN: 16 mg/dL (ref 6–20)
CHLORIDE: 106 mmol/L (ref 101–111)
CO2: 26 mmol/L (ref 22–32)
Calcium: 8.8 mg/dL — ABNORMAL LOW (ref 8.9–10.3)
Creatinine, Ser: 1.09 mg/dL (ref 0.61–1.24)
GFR calc Af Amer: >60 mL/min (ref >60)
GFR calc non Af Amer: >60 mL/min (ref >60)
Glucose, Bld: 146 mg/dL — ABNORMAL HIGH (ref 65–99)
POTASSIUM: 3.7 mmol/L (ref 3.5–5.1)
SODIUM: 140 mmol/L (ref 135–145)

## 2017-01-30 LAB — DIGOXIN LEVEL

## 2017-01-30 MED ORDER — TORSEMIDE 20 MG PO TABS: 20.0000 mg | ORAL_TABLET | Freq: Every day | ORAL | 6 refills | Status: DC | PRN

## 2017-01-30 NOTE — Patient Instructions (Signed)
Labs today (will call for abnormal results, otherwise no news is good news)  Take 20 mg of Torsemide for 2 days along with potassium 20 mEq.  Then take 20 mg of tosemide as needed for weight gain of 2 lbs overnight or 3 lbs in a week.   Follow up in 2 Months

## 2017-01-31 ENCOUNTER — Other Ambulatory Visit: Payer: Self-pay

## 2017-01-31 DIAGNOSIS — I447 Left bundle-branch block, unspecified: Secondary | ICD-10-CM | POA: Diagnosis not present

## 2017-01-31 DIAGNOSIS — D631 Anemia in chronic kidney disease: Secondary | ICD-10-CM | POA: Diagnosis not present

## 2017-01-31 DIAGNOSIS — I714 Abdominal aortic aneurysm, without rupture: Secondary | ICD-10-CM | POA: Diagnosis not present

## 2017-01-31 DIAGNOSIS — M48061 Spinal stenosis, lumbar region without neurogenic claudication: Secondary | ICD-10-CM | POA: Diagnosis not present

## 2017-01-31 DIAGNOSIS — E039 Hypothyroidism, unspecified: Secondary | ICD-10-CM | POA: Diagnosis not present

## 2017-01-31 DIAGNOSIS — I481 Persistent atrial fibrillation: Secondary | ICD-10-CM | POA: Diagnosis not present

## 2017-01-31 DIAGNOSIS — M170 Bilateral primary osteoarthritis of knee: Secondary | ICD-10-CM | POA: Diagnosis not present

## 2017-01-31 DIAGNOSIS — M1612 Unilateral primary osteoarthritis, left hip: Secondary | ICD-10-CM | POA: Diagnosis not present

## 2017-01-31 DIAGNOSIS — I130 Hypertensive heart and chronic kidney disease with heart failure and stage 1 through stage 4 chronic kidney disease, or unspecified chronic kidney disease: Secondary | ICD-10-CM | POA: Diagnosis not present

## 2017-01-31 DIAGNOSIS — I2510 Atherosclerotic heart disease of native coronary artery without angina pectoris: Secondary | ICD-10-CM | POA: Diagnosis not present

## 2017-01-31 DIAGNOSIS — I5022 Chronic systolic (congestive) heart failure: Secondary | ICD-10-CM | POA: Diagnosis not present

## 2017-01-31 DIAGNOSIS — N183 Chronic kidney disease, stage 3 (moderate): Secondary | ICD-10-CM | POA: Diagnosis not present

## 2017-01-31 DIAGNOSIS — I428 Other cardiomyopathies: Secondary | ICD-10-CM | POA: Diagnosis not present

## 2017-01-31 MED ORDER — DIGOXIN 125 MCG PO TABS: 125.0000 ug | ORAL_TABLET | ORAL | 2 refills | Status: DC

## 2017-01-31 MED ORDER — SIMVASTATIN 10 MG PO TABS: 10.0000 mg | ORAL_TABLET | Freq: Every evening | ORAL | 2 refills | Status: DC

## 2017-02-02 NOTE — Progress Notes (Signed)
Patient ID: [name] [name], male   DOB: 10-07-1931, 83 y.o.   MRN: [id number]     Advanced Heart Failure Clinic Note   Primary Cardiologist: Dr Mayford Knife PCP: Dr Zachery Dauer HF Cardiology: Jettie Booze  HPI: [name] [name] is a 83 y.o. male with PMH of A tach s/p DC-CV 12/29/12, CAD, PAF on chronic coumadin and amiodarone, OSA- CPAP, NICM cath 2008, chronic systolic heart failure EF 25-30% (01/2013) , S/P Medtronic CRT-D 2008 and generator changed 2012, LBBB, CRI (baseline 1.7-1.8) and Hypothyroidism.   He is S/P DC-CV 12/29/12 and initially he felt good for about a week. He had a functional decline and was only able to walk a few steps. He presented [facility] ED 01/22/13 with increased dyspnea on exertion and low extremity edema. He was started on a lasix gtt 10 mg hr. Admit weight 233 lbs. Discharge weight: 214 lbs  Patient presents for followup.  He is very deconditioned.  At last appointment, Coreg was stopped due to fatigue.  Dr. Graciela Husbands stopped his amiodarone due to imbalance/gait instability.  He is currently getting home PT but has very limited activity.  He says that his main limitation is poor balance.  He walks with a Gabor.  He is not short of breath walking with his Compean but does not go far.  No lightheadedness, chest pain, orthopnea/PND.  He has been taking torsemide only prn. Weight is up 2 lbs.   Optivol: Fluid index below threshold but trending up.  Impedance trending down. Minimal activity.  Frequent AF runs.    Echo 10/31/2016 LVEF 35-40%, Mod AI, Mod MR, Severe LAE, Moderate RVH, Severe RAE, PA peak pressure 19 mm Hg  ECG (personally reviewed): BiV pacing, suspect underlying atrial fibrillation.   Labs 8/21: Na 132 K 4.5 Cr 1.7 (stable)  Labs 03/02/13 NA 135 Potassium 4.6 Creatinine 1.6 Dig 1.6 TSH 3.95        04/02/13: digoxin 0.8, K+ 4.5, creatinine 1.48         09/08/13 K 4.4 Creatine 1.7          6/18: K 4, creatinine 1.32, TSH normal, hgb 12.9         Review of systems complete and  found to be negative unless listed in HPI.    Past Medical History:  Diagnosis Date  . Anemia, unspecified 04/07/2013  . Atrial fibrillation (HCC)    fibrillation/flutter  . BPH (benign prostatic hypertrophy)   . Chronic systolic CHF (congestive heart failure) (HCC)    Nonischemic DCM EF 10% s/p BiV AICD  . Coronary artery disease 2008   nonobstructive ASCAD  . Depression   . Hyperlipidemia   . Hypertension   . Hypothyroidism   . Left bundle branch block   . Nonischemic cardiomyopathy (HCC)    ef 10 %  . OSA (obstructive sleep apnea)    intolerant to CPAP  . Osteoarthritis   . Sleep apnea    stopped using CPAP  . Thrombocytopenia (HCC)     Current Outpatient Prescriptions  Medication Sig Dispense Refill  . acetaminophen (TYLENOL) 650 MG CR tablet Take 1 tablet (650 mg total) by mouth every 8 (eight) hours as needed for pain. 90 tablet 3  . AMBULATORY NON FORMULARY MEDICATION Front wheel rolling [place] for use as needed due to gait instability. Dispense 1   Fax to [phone]  R26.81 1 each 0  . benzonatate (TESSALON) 200 MG capsule Take 1 capsule (200 mg total) by mouth 3 (three) times  daily as needed for cough. 45 capsule 0  . levothyroxine (SYNTHROID, LEVOTHROID) 137 MCG tablet TAKE 1 TABLET BY MOUTH  DAILY BEFORE BREAKFAST 90 tablet 1  . ramipril (ALTACE) 2.5 MG capsule Take 2.5 mg by mouth daily.    Marland Kitchen spironolactone (ALDACTONE) 25 MG tablet Take 0.5 tablets (12.5 mg total) by mouth daily. 45 tablet 3  . warfarin (COUMADIN) 1 MG tablet TAKE 1 TABLET BY MOUTH ON  TUESDAYS AND FRIDAYS ONLY  (TAKE WITH 5MG ) 26 tablet 6  . warfarin (COUMADIN) 5 MG tablet TAKE 1 TABLET BY MOUTH  EVERY DAY 90 tablet 3  . digoxin (DIGOX) 0.125 MG tablet Take 1 tablet (125 mcg total) by mouth every other day. 45 tablet 2  . potassium chloride SA (K-DUR,KLOR-CON) 20 MEQ tablet Take 1 tablet (20 mEq total) by mouth daily as needed (with each torsemide dose). (Patient not taking: Reported on  01/30/2017) 10 tablet 10  . simvastatin (ZOCOR) 10 MG tablet Take 1 tablet (10 mg total) by mouth every evening. 90 tablet 2  . torsemide (DEMADEX) 20 MG tablet Take 1 tablet (20 mg total) by mouth daily as needed (weight gain). Take 1 tablet for 2lb wt gain overnight or 3 lbs in a week. 30 tablet 6   No current facility-administered medications for this encounter.     Vitals:   01/30/17 1203  BP: 118/67  Pulse: 70  SpO2: 96%  Weight: 210 lb 8 oz (95.5 kg)   Wt Readings from Last 3 Encounters:  01/30/17 210 lb 8 oz (95.5 kg)  01/03/17 209 lb (94.8 kg)  12/07/16 210 lb (95.3 kg)    PHYSICAL EXAM: General: NAD, chronically ill-appearing Neck: No JVD, no thyromegaly or thyroid nodule.  Lungs: Clear to auscultation bilaterally with normal respiratory effort. CV: Nondisplaced PMI.  Heart regular S1/S2, no S3/S4, no murmur.  1+ edema 1/2 to knee on right, 1+ edema left ankle.  No carotid bruit.  Difficult to palpate pedal pulses.  Abdomen: Soft, nontender, no hepatosplenomegaly, no distention.  Skin: Intact without lesions or rashes.  Neurologic: Alert and oriented x 3.  Psych: Normal affect. Extremities: No clubbing or cyanosis.  HEENT: Normal.   ASSESSMENT & PLAN:  1) Chronic systolic HF: NICM 01/2013 EF 20%; Medtronic CRT-D.  Most recent echo 10/2016 LVEF 35-40%, severe LAE, severe RAE, PA peak pressure 19 mm Hg.  NYHA class IIIb symptoms => fatigue, but very limited by poor balance.  No JVD, but Optivol suggests development of volume overload.  - Would hold off on RHC => seems limited most by severe balance difficulty/gait instability, EF is 35-40% so not markedly low.   - Will have him take torsemide 20 mg daily + KCl 20 mEq daily x 2 days then back to prn => use torsemide prn 2 lb weight gain in 24 hrs or 3 lbs in a week.  - Continue spironolactone and ramipril at current doses, check BMET.   - Off Coreg with fatigue and soft pressures.  - Continue digoxin, check level.   2) OSA:  Continue nightly CPAP 3) Atrial tachy/AF: Paroxysmal. Frequent runs AF by device interrogation.  He is probably in AF today based on ECG. He was taken off amiodarone due to gait instability/imbalance, I will not restart.  - Continue warfarin, check CBC.  4) Deconditioning: Continue PT.  This is his biggest problem.   Followup in 2 months.  Marca Ancona, MD  02/02/2017

## 2017-02-04 ENCOUNTER — Ambulatory Visit (INDEPENDENT_AMBULATORY_CARE_PROVIDER_SITE_OTHER): Payer: Medicare Other | Admitting: Family Medicine

## 2017-02-04 ENCOUNTER — Encounter: Payer: Self-pay | Admitting: Family Medicine

## 2017-02-04 ENCOUNTER — Other Ambulatory Visit: Payer: Self-pay | Admitting: Internal Medicine

## 2017-02-04 VITALS — BP 95/54 | HR 70 | Wt 208.0 lb

## 2017-02-04 DIAGNOSIS — I428 Other cardiomyopathies: Secondary | ICD-10-CM

## 2017-02-04 DIAGNOSIS — I4819 Other persistent atrial fibrillation: Secondary | ICD-10-CM

## 2017-02-04 DIAGNOSIS — M6281 Muscle weakness (generalized): Secondary | ICD-10-CM | POA: Diagnosis not present

## 2017-02-04 DIAGNOSIS — I481 Persistent atrial fibrillation: Secondary | ICD-10-CM

## 2017-02-04 DIAGNOSIS — Z7901 Long term (current) use of anticoagulants: Secondary | ICD-10-CM

## 2017-02-04 DIAGNOSIS — R2681 Unsteadiness on feet: Secondary | ICD-10-CM

## 2017-02-04 DIAGNOSIS — M170 Bilateral primary osteoarthritis of knee: Secondary | ICD-10-CM | POA: Diagnosis not present

## 2017-02-04 LAB — POCT INR: INR: 2.2

## 2017-02-04 MED ORDER — DICLOFENAC SODIUM 1 % TD GEL: 4.0000 g | Freq: Four times a day (QID) | TRANSDERMAL | 11 refills | Status: DC

## 2017-02-04 NOTE — Patient Instructions (Signed)
Thank you for coming in today. Try using voltaren gel for pain.  Recheck in 1 month.  We will reconsider knee injections then.    Continue Warfarin doses.

## 2017-02-04 NOTE — Progress Notes (Signed)
[name] [name] is a 83 y.o. male who presents to [name]: Primary Care Sports Medicine today for follow-up deconditioning. Patient continues to experience leg weakness secondary to knee DJD and deconditioning. He continues home physical therapy and is doing well. He denies any radiating pain or numbness. He received bilateral knee injections about 2 months ago.  His heart failure stable. He recently seen by his cardiologist and seems to be doing about well on the current regimen.   Past Medical History:  Diagnosis Date  . Anemia, unspecified 04/07/2013  . Atrial fibrillation (HCC)    fibrillation/flutter  . BPH (benign prostatic hypertrophy)   . Chronic systolic CHF (congestive heart failure) (HCC)    Nonischemic DCM EF 10% s/p BiV AICD  . Coronary artery disease 2008   nonobstructive ASCAD  . Depression   . Hyperlipidemia   . Hypertension   . Hypothyroidism   . Left bundle branch block   . Nonischemic cardiomyopathy (HCC)    ef 10 %  . OSA (obstructive sleep apnea)    intolerant to CPAP  . Osteoarthritis   . Sleep apnea    stopped using CPAP  . Thrombocytopenia (HCC)    Past Surgical History:  Procedure Laterality Date  . CARDIAC DEFIBRILLATOR PLACEMENT     left chest  . CARDIOVERSION N/A 12/29/2012   Procedure: CARDIOVERSION;  Surgeon: Quintella Reichert, MD;  Location: [facility];  Service: Cardiovascular;  Laterality: N/A;  . CHOLECYSTECTOMY    . Colonscopy     reportedly negative per pt; around 2005.    . EP IMPLANTABLE DEVICE N/A 05/09/2016   Procedure: BIVI ICD Generator Changeout;  Surgeon: Duke Salvia, MD;  Location: [facility] INVASIVE CV LAB;  Service: Cardiovascular;  Laterality: N/A;  . TRANSURETHRAL RESECTION OF PROSTATE     for BPH   Social History  Substance Use Topics  . Smoking status: Never Smoker  . Smokeless tobacco: Never Used  . Alcohol use No   family  history includes Cancer in his maternal uncle; Heart disease in his father; Heart failure in his mother; Stroke in his mother.  ROS as above:  Medications: Current Outpatient Prescriptions  Medication Sig Dispense Refill  . acetaminophen (TYLENOL) 650 MG CR tablet Take 1 tablet (650 mg total) by mouth every 8 (eight) hours as needed for pain. 90 tablet 3  . AMBULATORY NON FORMULARY MEDICATION Front wheel rolling Auth for use as needed due to gait instability. Dispense 1   Fax to [phone]  R26.81 1 each 0  . digoxin (DIGOX) 0.125 MG tablet Take 1 tablet (125 mcg total) by mouth every other day. 45 tablet 2  . levothyroxine (SYNTHROID, LEVOTHROID) 137 MCG tablet TAKE 1 TABLET BY MOUTH  DAILY BEFORE BREAKFAST 90 tablet 1  . potassium chloride SA (K-DUR,KLOR-CON) 20 MEQ tablet Take 1 tablet (20 mEq total) by mouth daily as needed (with each torsemide dose). 10 tablet 10  . ramipril (ALTACE) 2.5 MG capsule Take 2.5 mg by mouth daily.    . simvastatin (ZOCOR) 10 MG tablet Take 1 tablet (10 mg total) by mouth every evening. 90 tablet 2  . spironolactone (ALDACTONE) 25 MG tablet Take 0.5 tablets (12.5 mg total) by mouth daily. 45 tablet 3  . torsemide (DEMADEX) 20 MG tablet Take 1 tablet (20 mg total) by mouth daily as needed (weight gain). Take 1 tablet for 2lb wt gain overnight or 3 lbs in a week. 30 tablet 6  .  warfarin (COUMADIN) 1 MG tablet TAKE 1 TABLET BY MOUTH ON  TUESDAYS AND FRIDAYS ONLY  (TAKE WITH 5MG ) 26 tablet 6  . warfarin (COUMADIN) 5 MG tablet TAKE 1 TABLET BY MOUTH  EVERY DAY 90 tablet 3  . diclofenac sodium (VOLTAREN) 1 % GEL Apply 4 g topically 4 (four) times daily. To affected joint. 100 g 11   No current facility-administered medications for this visit.    Allergies  Allergen Reactions  . Amiodarone Other (See Comments)    Neuro toxicity    Health Maintenance Health Maintenance  Topic Date Due  . PNA vac Low Risk Adult (1 of 2 - PCV13) 05/29/1999  . INFLUENZA  VACCINE  01/23/2017  . TETANUS/TDAP  07/01/2019     Exam:  BP (!) 95/54   Pulse 70   Wt 208 lb (94.3 kg)   SpO2 96%   BMI 25.32 kg/m   Wt Readings from Last 10 Encounters:  02/04/17 208 lb (94.3 kg)  01/30/17 210 lb 8 oz (95.5 kg)  01/03/17 209 lb (94.8 kg)  12/07/16 210 lb (95.3 kg)  12/03/16 208 lb 2 oz (94.4 kg)  11/27/16 211 lb (95.7 kg)  11/26/16 202 lb (91.6 kg)  11/21/16 212 lb (96.2 kg)  11/06/16 202 lb 3.2 oz (91.7 kg)  11/05/16 205 lb (93 kg)    Gen: Well NAD HEENT: EOMI,  MMM Lungs: Normal work of breathing. CTABL Heart: RRR no MRG Abd: NABS, Soft. Nondistended, Nontender Exts: Brisk capillary refill, warm and well perfused.  Knees bilaterally mild effusion. Nontender decreased motion due to pain.   Results for orders placed or performed in visit on 02/04/17 (from the past 72 hour(s))  POCT INR     Status: None   Collection Time: 02/04/17 11:39 AM  Result Value Ref Range   INR 2.2    No results found.    Assessment and Plan: 83 y.o. male with  Deconditioning and knee DJD. Continue physical therapy prescribe diclofenac gel for knee pain and recheck in 1 month we'll likely proceed with steroid injection at that time.  Heart failure doing reasonably well with cardiology oversight. Continued weight-based diuretic dosing.  Anticoagulation a goal continue current warfarin dose.   Orders Placed This Encounter  Procedures  . POCT INR   Meds ordered this encounter  Medications  . diclofenac sodium (VOLTAREN) 1 % GEL    Sig: Apply 4 g topically 4 (four) times daily. To affected joint.    Dispense:  100 g    Refill:  11     Discussed warning signs or symptoms. Please see discharge instructions. Patient expresses understanding. '

## 2017-02-05 DIAGNOSIS — I714 Abdominal aortic aneurysm, without rupture: Secondary | ICD-10-CM | POA: Diagnosis not present

## 2017-02-05 DIAGNOSIS — I481 Persistent atrial fibrillation: Secondary | ICD-10-CM | POA: Diagnosis not present

## 2017-02-05 DIAGNOSIS — N183 Chronic kidney disease, stage 3 (moderate): Secondary | ICD-10-CM | POA: Diagnosis not present

## 2017-02-05 DIAGNOSIS — M170 Bilateral primary osteoarthritis of knee: Secondary | ICD-10-CM | POA: Diagnosis not present

## 2017-02-05 DIAGNOSIS — E039 Hypothyroidism, unspecified: Secondary | ICD-10-CM | POA: Diagnosis not present

## 2017-02-05 DIAGNOSIS — M1612 Unilateral primary osteoarthritis, left hip: Secondary | ICD-10-CM | POA: Diagnosis not present

## 2017-02-05 DIAGNOSIS — D631 Anemia in chronic kidney disease: Secondary | ICD-10-CM | POA: Diagnosis not present

## 2017-02-05 DIAGNOSIS — I428 Other cardiomyopathies: Secondary | ICD-10-CM | POA: Diagnosis not present

## 2017-02-05 DIAGNOSIS — M48061 Spinal stenosis, lumbar region without neurogenic claudication: Secondary | ICD-10-CM | POA: Diagnosis not present

## 2017-02-05 DIAGNOSIS — I2510 Atherosclerotic heart disease of native coronary artery without angina pectoris: Secondary | ICD-10-CM | POA: Diagnosis not present

## 2017-02-05 DIAGNOSIS — I5022 Chronic systolic (congestive) heart failure: Secondary | ICD-10-CM | POA: Diagnosis not present

## 2017-02-05 DIAGNOSIS — I130 Hypertensive heart and chronic kidney disease with heart failure and stage 1 through stage 4 chronic kidney disease, or unspecified chronic kidney disease: Secondary | ICD-10-CM | POA: Diagnosis not present

## 2017-02-05 DIAGNOSIS — I447 Left bundle-branch block, unspecified: Secondary | ICD-10-CM | POA: Diagnosis not present

## 2017-02-06 DIAGNOSIS — I2510 Atherosclerotic heart disease of native coronary artery without angina pectoris: Secondary | ICD-10-CM | POA: Diagnosis not present

## 2017-02-06 DIAGNOSIS — D631 Anemia in chronic kidney disease: Secondary | ICD-10-CM | POA: Diagnosis not present

## 2017-02-06 DIAGNOSIS — I5022 Chronic systolic (congestive) heart failure: Secondary | ICD-10-CM | POA: Diagnosis not present

## 2017-02-06 DIAGNOSIS — M170 Bilateral primary osteoarthritis of knee: Secondary | ICD-10-CM | POA: Diagnosis not present

## 2017-02-06 DIAGNOSIS — I714 Abdominal aortic aneurysm, without rupture: Secondary | ICD-10-CM | POA: Diagnosis not present

## 2017-02-06 DIAGNOSIS — M1612 Unilateral primary osteoarthritis, left hip: Secondary | ICD-10-CM | POA: Diagnosis not present

## 2017-02-06 DIAGNOSIS — I428 Other cardiomyopathies: Secondary | ICD-10-CM | POA: Diagnosis not present

## 2017-02-06 DIAGNOSIS — I130 Hypertensive heart and chronic kidney disease with heart failure and stage 1 through stage 4 chronic kidney disease, or unspecified chronic kidney disease: Secondary | ICD-10-CM | POA: Diagnosis not present

## 2017-02-06 DIAGNOSIS — M48061 Spinal stenosis, lumbar region without neurogenic claudication: Secondary | ICD-10-CM | POA: Diagnosis not present

## 2017-02-06 DIAGNOSIS — E039 Hypothyroidism, unspecified: Secondary | ICD-10-CM | POA: Diagnosis not present

## 2017-02-06 DIAGNOSIS — I481 Persistent atrial fibrillation: Secondary | ICD-10-CM | POA: Diagnosis not present

## 2017-02-06 DIAGNOSIS — N183 Chronic kidney disease, stage 3 (moderate): Secondary | ICD-10-CM | POA: Diagnosis not present

## 2017-02-06 DIAGNOSIS — I447 Left bundle-branch block, unspecified: Secondary | ICD-10-CM | POA: Diagnosis not present

## 2017-02-07 DIAGNOSIS — I428 Other cardiomyopathies: Secondary | ICD-10-CM | POA: Diagnosis not present

## 2017-02-07 DIAGNOSIS — I2510 Atherosclerotic heart disease of native coronary artery without angina pectoris: Secondary | ICD-10-CM | POA: Diagnosis not present

## 2017-02-07 DIAGNOSIS — N183 Chronic kidney disease, stage 3 (moderate): Secondary | ICD-10-CM | POA: Diagnosis not present

## 2017-02-07 DIAGNOSIS — I130 Hypertensive heart and chronic kidney disease with heart failure and stage 1 through stage 4 chronic kidney disease, or unspecified chronic kidney disease: Secondary | ICD-10-CM | POA: Diagnosis not present

## 2017-02-07 DIAGNOSIS — I714 Abdominal aortic aneurysm, without rupture: Secondary | ICD-10-CM | POA: Diagnosis not present

## 2017-02-07 DIAGNOSIS — M170 Bilateral primary osteoarthritis of knee: Secondary | ICD-10-CM | POA: Diagnosis not present

## 2017-02-07 DIAGNOSIS — E039 Hypothyroidism, unspecified: Secondary | ICD-10-CM | POA: Diagnosis not present

## 2017-02-07 DIAGNOSIS — M48061 Spinal stenosis, lumbar region without neurogenic claudication: Secondary | ICD-10-CM | POA: Diagnosis not present

## 2017-02-07 DIAGNOSIS — D631 Anemia in chronic kidney disease: Secondary | ICD-10-CM | POA: Diagnosis not present

## 2017-02-07 DIAGNOSIS — I5022 Chronic systolic (congestive) heart failure: Secondary | ICD-10-CM | POA: Diagnosis not present

## 2017-02-07 DIAGNOSIS — I481 Persistent atrial fibrillation: Secondary | ICD-10-CM | POA: Diagnosis not present

## 2017-02-07 DIAGNOSIS — M1612 Unilateral primary osteoarthritis, left hip: Secondary | ICD-10-CM | POA: Diagnosis not present

## 2017-02-07 DIAGNOSIS — I447 Left bundle-branch block, unspecified: Secondary | ICD-10-CM | POA: Diagnosis not present

## 2017-02-11 DIAGNOSIS — I130 Hypertensive heart and chronic kidney disease with heart failure and stage 1 through stage 4 chronic kidney disease, or unspecified chronic kidney disease: Secondary | ICD-10-CM | POA: Diagnosis not present

## 2017-02-11 DIAGNOSIS — I5022 Chronic systolic (congestive) heart failure: Secondary | ICD-10-CM | POA: Diagnosis not present

## 2017-02-11 DIAGNOSIS — M1612 Unilateral primary osteoarthritis, left hip: Secondary | ICD-10-CM | POA: Diagnosis not present

## 2017-02-11 DIAGNOSIS — M170 Bilateral primary osteoarthritis of knee: Secondary | ICD-10-CM | POA: Diagnosis not present

## 2017-02-11 DIAGNOSIS — M48061 Spinal stenosis, lumbar region without neurogenic claudication: Secondary | ICD-10-CM | POA: Diagnosis not present

## 2017-02-11 DIAGNOSIS — I481 Persistent atrial fibrillation: Secondary | ICD-10-CM | POA: Diagnosis not present

## 2017-02-11 DIAGNOSIS — I714 Abdominal aortic aneurysm, without rupture: Secondary | ICD-10-CM | POA: Diagnosis not present

## 2017-02-11 DIAGNOSIS — D631 Anemia in chronic kidney disease: Secondary | ICD-10-CM | POA: Diagnosis not present

## 2017-02-11 DIAGNOSIS — I2510 Atherosclerotic heart disease of native coronary artery without angina pectoris: Secondary | ICD-10-CM | POA: Diagnosis not present

## 2017-02-11 DIAGNOSIS — I447 Left bundle-branch block, unspecified: Secondary | ICD-10-CM | POA: Diagnosis not present

## 2017-02-11 DIAGNOSIS — N183 Chronic kidney disease, stage 3 (moderate): Secondary | ICD-10-CM | POA: Diagnosis not present

## 2017-02-11 DIAGNOSIS — E039 Hypothyroidism, unspecified: Secondary | ICD-10-CM | POA: Diagnosis not present

## 2017-02-11 DIAGNOSIS — I428 Other cardiomyopathies: Secondary | ICD-10-CM | POA: Diagnosis not present

## 2017-02-12 DIAGNOSIS — I5022 Chronic systolic (congestive) heart failure: Secondary | ICD-10-CM | POA: Diagnosis not present

## 2017-02-12 DIAGNOSIS — M1612 Unilateral primary osteoarthritis, left hip: Secondary | ICD-10-CM | POA: Diagnosis not present

## 2017-02-12 DIAGNOSIS — N183 Chronic kidney disease, stage 3 (moderate): Secondary | ICD-10-CM | POA: Diagnosis not present

## 2017-02-12 DIAGNOSIS — I714 Abdominal aortic aneurysm, without rupture: Secondary | ICD-10-CM | POA: Diagnosis not present

## 2017-02-12 DIAGNOSIS — I428 Other cardiomyopathies: Secondary | ICD-10-CM | POA: Diagnosis not present

## 2017-02-12 DIAGNOSIS — E039 Hypothyroidism, unspecified: Secondary | ICD-10-CM | POA: Diagnosis not present

## 2017-02-12 DIAGNOSIS — M48061 Spinal stenosis, lumbar region without neurogenic claudication: Secondary | ICD-10-CM | POA: Diagnosis not present

## 2017-02-12 DIAGNOSIS — I2510 Atherosclerotic heart disease of native coronary artery without angina pectoris: Secondary | ICD-10-CM | POA: Diagnosis not present

## 2017-02-12 DIAGNOSIS — I481 Persistent atrial fibrillation: Secondary | ICD-10-CM | POA: Diagnosis not present

## 2017-02-12 DIAGNOSIS — I130 Hypertensive heart and chronic kidney disease with heart failure and stage 1 through stage 4 chronic kidney disease, or unspecified chronic kidney disease: Secondary | ICD-10-CM | POA: Diagnosis not present

## 2017-02-12 DIAGNOSIS — D631 Anemia in chronic kidney disease: Secondary | ICD-10-CM | POA: Diagnosis not present

## 2017-02-12 DIAGNOSIS — M170 Bilateral primary osteoarthritis of knee: Secondary | ICD-10-CM | POA: Diagnosis not present

## 2017-02-12 DIAGNOSIS — I447 Left bundle-branch block, unspecified: Secondary | ICD-10-CM | POA: Diagnosis not present

## 2017-02-13 ENCOUNTER — Telehealth: Payer: Self-pay

## 2017-02-13 DIAGNOSIS — N183 Chronic kidney disease, stage 3 (moderate): Secondary | ICD-10-CM | POA: Diagnosis not present

## 2017-02-13 DIAGNOSIS — I5022 Chronic systolic (congestive) heart failure: Secondary | ICD-10-CM | POA: Diagnosis not present

## 2017-02-13 DIAGNOSIS — I481 Persistent atrial fibrillation: Secondary | ICD-10-CM | POA: Diagnosis not present

## 2017-02-13 DIAGNOSIS — I130 Hypertensive heart and chronic kidney disease with heart failure and stage 1 through stage 4 chronic kidney disease, or unspecified chronic kidney disease: Secondary | ICD-10-CM | POA: Diagnosis not present

## 2017-02-13 DIAGNOSIS — I447 Left bundle-branch block, unspecified: Secondary | ICD-10-CM | POA: Diagnosis not present

## 2017-02-13 DIAGNOSIS — I2510 Atherosclerotic heart disease of native coronary artery without angina pectoris: Secondary | ICD-10-CM | POA: Diagnosis not present

## 2017-02-13 DIAGNOSIS — M48061 Spinal stenosis, lumbar region without neurogenic claudication: Secondary | ICD-10-CM | POA: Diagnosis not present

## 2017-02-13 DIAGNOSIS — D631 Anemia in chronic kidney disease: Secondary | ICD-10-CM | POA: Diagnosis not present

## 2017-02-13 DIAGNOSIS — I428 Other cardiomyopathies: Secondary | ICD-10-CM | POA: Diagnosis not present

## 2017-02-13 DIAGNOSIS — M170 Bilateral primary osteoarthritis of knee: Secondary | ICD-10-CM | POA: Diagnosis not present

## 2017-02-13 DIAGNOSIS — E039 Hypothyroidism, unspecified: Secondary | ICD-10-CM | POA: Diagnosis not present

## 2017-02-13 DIAGNOSIS — M1612 Unilateral primary osteoarthritis, left hip: Secondary | ICD-10-CM | POA: Diagnosis not present

## 2017-02-13 DIAGNOSIS — I714 Abdominal aortic aneurysm, without rupture: Secondary | ICD-10-CM | POA: Diagnosis not present

## 2017-02-13 NOTE — Telephone Encounter (Signed)
Home health called stating that pt had a fall Friday night. EMS was called, pt is doing well with no injuries.

## 2017-02-14 ENCOUNTER — Ambulatory Visit (INDEPENDENT_AMBULATORY_CARE_PROVIDER_SITE_OTHER): Payer: Medicare Other | Admitting: *Deleted

## 2017-02-14 DIAGNOSIS — I428 Other cardiomyopathies: Secondary | ICD-10-CM

## 2017-02-14 NOTE — Progress Notes (Signed)
Remote ICD transmission.   

## 2017-02-15 DIAGNOSIS — I428 Other cardiomyopathies: Secondary | ICD-10-CM | POA: Diagnosis not present

## 2017-02-15 DIAGNOSIS — I2510 Atherosclerotic heart disease of native coronary artery without angina pectoris: Secondary | ICD-10-CM | POA: Diagnosis not present

## 2017-02-15 DIAGNOSIS — I5022 Chronic systolic (congestive) heart failure: Secondary | ICD-10-CM | POA: Diagnosis not present

## 2017-02-15 DIAGNOSIS — E039 Hypothyroidism, unspecified: Secondary | ICD-10-CM | POA: Diagnosis not present

## 2017-02-15 DIAGNOSIS — M170 Bilateral primary osteoarthritis of knee: Secondary | ICD-10-CM | POA: Diagnosis not present

## 2017-02-15 DIAGNOSIS — M48061 Spinal stenosis, lumbar region without neurogenic claudication: Secondary | ICD-10-CM | POA: Diagnosis not present

## 2017-02-15 DIAGNOSIS — N183 Chronic kidney disease, stage 3 (moderate): Secondary | ICD-10-CM | POA: Diagnosis not present

## 2017-02-15 DIAGNOSIS — M1612 Unilateral primary osteoarthritis, left hip: Secondary | ICD-10-CM | POA: Diagnosis not present

## 2017-02-15 DIAGNOSIS — I130 Hypertensive heart and chronic kidney disease with heart failure and stage 1 through stage 4 chronic kidney disease, or unspecified chronic kidney disease: Secondary | ICD-10-CM | POA: Diagnosis not present

## 2017-02-15 DIAGNOSIS — D631 Anemia in chronic kidney disease: Secondary | ICD-10-CM | POA: Diagnosis not present

## 2017-02-15 DIAGNOSIS — I447 Left bundle-branch block, unspecified: Secondary | ICD-10-CM | POA: Diagnosis not present

## 2017-02-15 DIAGNOSIS — I714 Abdominal aortic aneurysm, without rupture: Secondary | ICD-10-CM | POA: Diagnosis not present

## 2017-02-15 DIAGNOSIS — I481 Persistent atrial fibrillation: Secondary | ICD-10-CM | POA: Diagnosis not present

## 2017-02-15 LAB — CUP PACEART REMOTE DEVICE CHECK
Battery Voltage: 3 V
Brady Statistic AP VP Percent: 17.94 %
Brady Statistic AP VS Percent: 0.1 %
Brady Statistic AS VS Percent: 8.33 %
Brady Statistic RV Percent Paced: 90.49 %
HIGH POWER IMPEDANCE MEASURED VALUE: 46 Ohm
HighPow Impedance: 37 Ohm
Implantable Lead Implant Date: 20080806
Implantable Lead Implant Date: 20080806
Implantable Lead Location: 753858
Implantable Lead Location: 753859
Implantable Lead Model: 6947
Lead Channel Impedance Value: 380 Ohm
Lead Channel Impedance Value: 437 Ohm
Lead Channel Impedance Value: 494 Ohm
Lead Channel Impedance Value: 817 Ohm
Lead Channel Pacing Threshold Pulse Width: 0.4 ms
Lead Channel Sensing Intrinsic Amplitude: 0.625 mV
Lead Channel Sensing Intrinsic Amplitude: 0.625 mV
Lead Channel Sensing Intrinsic Amplitude: 3.75 mV
Lead Channel Sensing Intrinsic Amplitude: 3.75 mV
Lead Channel Setting Pacing Amplitude: 2 V
Lead Channel Setting Pacing Amplitude: 2.5 V
Lead Channel Setting Pacing Pulse Width: 0.4 ms
MDC IDC LEAD IMPLANT DT: 20080806
MDC IDC LEAD LOCATION: 753860
MDC IDC MSMT BATTERY REMAINING LONGEVITY: 78 mo
MDC IDC MSMT LEADCHNL RA IMPEDANCE VALUE: 380 Ohm
MDC IDC MSMT LEADCHNL RV IMPEDANCE VALUE: 323 Ohm
MDC IDC MSMT LEADCHNL RV PACING THRESHOLD AMPLITUDE: 0.625 V
MDC IDC PG IMPLANT DT: 20171115
MDC IDC SESS DTM: 20180823073322
MDC IDC SET LEADCHNL LV PACING PULSEWIDTH: 0.4 ms
MDC IDC SET LEADCHNL RV PACING AMPLITUDE: 2 V
MDC IDC SET LEADCHNL RV SENSING SENSITIVITY: 0.3 mV
MDC IDC STAT BRADY AS VP PERCENT: 73.64 %
MDC IDC STAT BRADY RA PERCENT PACED: 16.04 %

## 2017-02-18 ENCOUNTER — Telehealth: Payer: Self-pay | Admitting: *Deleted

## 2017-02-18 DIAGNOSIS — I447 Left bundle-branch block, unspecified: Secondary | ICD-10-CM | POA: Diagnosis not present

## 2017-02-18 DIAGNOSIS — M170 Bilateral primary osteoarthritis of knee: Secondary | ICD-10-CM | POA: Diagnosis not present

## 2017-02-18 DIAGNOSIS — I130 Hypertensive heart and chronic kidney disease with heart failure and stage 1 through stage 4 chronic kidney disease, or unspecified chronic kidney disease: Secondary | ICD-10-CM | POA: Diagnosis not present

## 2017-02-18 DIAGNOSIS — I481 Persistent atrial fibrillation: Secondary | ICD-10-CM | POA: Diagnosis not present

## 2017-02-18 DIAGNOSIS — I5022 Chronic systolic (congestive) heart failure: Secondary | ICD-10-CM | POA: Diagnosis not present

## 2017-02-18 DIAGNOSIS — I428 Other cardiomyopathies: Secondary | ICD-10-CM | POA: Diagnosis not present

## 2017-02-18 DIAGNOSIS — I2510 Atherosclerotic heart disease of native coronary artery without angina pectoris: Secondary | ICD-10-CM | POA: Diagnosis not present

## 2017-02-18 DIAGNOSIS — N183 Chronic kidney disease, stage 3 (moderate): Secondary | ICD-10-CM | POA: Diagnosis not present

## 2017-02-18 DIAGNOSIS — I714 Abdominal aortic aneurysm, without rupture: Secondary | ICD-10-CM | POA: Diagnosis not present

## 2017-02-18 DIAGNOSIS — E039 Hypothyroidism, unspecified: Secondary | ICD-10-CM | POA: Diagnosis not present

## 2017-02-18 DIAGNOSIS — M1612 Unilateral primary osteoarthritis, left hip: Secondary | ICD-10-CM | POA: Diagnosis not present

## 2017-02-18 DIAGNOSIS — M48061 Spinal stenosis, lumbar region without neurogenic claudication: Secondary | ICD-10-CM | POA: Diagnosis not present

## 2017-02-18 DIAGNOSIS — D631 Anemia in chronic kidney disease: Secondary | ICD-10-CM | POA: Diagnosis not present

## 2017-02-18 NOTE — Telephone Encounter (Signed)
I think we need a follow up appointment.

## 2017-02-18 NOTE — Telephone Encounter (Signed)
Pt's wife called and stated that the medication that was given for the swelling has not been effective.   Also today was the last day for his PT it was shown not to be showing any improvement. And medicare will no longer pay for this he has used up all the time that is covered by medicare.   She wanted to know what should be done. Please advise.   Will fwd to pcp for advice.Loralee Pacas [place]

## 2017-02-19 ENCOUNTER — Ambulatory Visit: Payer: Medicare Other | Admitting: Cardiology

## 2017-02-19 NOTE — Telephone Encounter (Signed)
Called and informed pt's wife she will need to schedule a f/u appt for him to be seen. She stated that she would call back to do this.Loralee Pacas [place]

## 2017-02-20 DIAGNOSIS — E039 Hypothyroidism, unspecified: Secondary | ICD-10-CM | POA: Diagnosis not present

## 2017-02-20 DIAGNOSIS — I2510 Atherosclerotic heart disease of native coronary artery without angina pectoris: Secondary | ICD-10-CM | POA: Diagnosis not present

## 2017-02-20 DIAGNOSIS — N183 Chronic kidney disease, stage 3 (moderate): Secondary | ICD-10-CM | POA: Diagnosis not present

## 2017-02-20 DIAGNOSIS — D631 Anemia in chronic kidney disease: Secondary | ICD-10-CM | POA: Diagnosis not present

## 2017-02-20 DIAGNOSIS — M48061 Spinal stenosis, lumbar region without neurogenic claudication: Secondary | ICD-10-CM | POA: Diagnosis not present

## 2017-02-20 DIAGNOSIS — M170 Bilateral primary osteoarthritis of knee: Secondary | ICD-10-CM | POA: Diagnosis not present

## 2017-02-20 DIAGNOSIS — I714 Abdominal aortic aneurysm, without rupture: Secondary | ICD-10-CM | POA: Diagnosis not present

## 2017-02-20 DIAGNOSIS — I447 Left bundle-branch block, unspecified: Secondary | ICD-10-CM | POA: Diagnosis not present

## 2017-02-20 DIAGNOSIS — I130 Hypertensive heart and chronic kidney disease with heart failure and stage 1 through stage 4 chronic kidney disease, or unspecified chronic kidney disease: Secondary | ICD-10-CM | POA: Diagnosis not present

## 2017-02-20 DIAGNOSIS — I5022 Chronic systolic (congestive) heart failure: Secondary | ICD-10-CM | POA: Diagnosis not present

## 2017-02-20 DIAGNOSIS — I428 Other cardiomyopathies: Secondary | ICD-10-CM | POA: Diagnosis not present

## 2017-02-20 DIAGNOSIS — I481 Persistent atrial fibrillation: Secondary | ICD-10-CM | POA: Diagnosis not present

## 2017-02-20 DIAGNOSIS — M1612 Unilateral primary osteoarthritis, left hip: Secondary | ICD-10-CM | POA: Diagnosis not present

## 2017-02-22 ENCOUNTER — Telehealth: Payer: Self-pay

## 2017-02-22 ENCOUNTER — Encounter: Payer: Self-pay | Admitting: Cardiology

## 2017-02-22 ENCOUNTER — Encounter: Payer: Self-pay | Admitting: Family Medicine

## 2017-02-22 ENCOUNTER — Ambulatory Visit (INDEPENDENT_AMBULATORY_CARE_PROVIDER_SITE_OTHER): Payer: Medicare Other | Admitting: Family Medicine

## 2017-02-22 VITALS — BP 110/67 | HR 73 | Wt 205.0 lb

## 2017-02-22 DIAGNOSIS — M170 Bilateral primary osteoarthritis of knee: Secondary | ICD-10-CM | POA: Diagnosis not present

## 2017-02-22 DIAGNOSIS — I481 Persistent atrial fibrillation: Secondary | ICD-10-CM

## 2017-02-22 DIAGNOSIS — I2510 Atherosclerotic heart disease of native coronary artery without angina pectoris: Secondary | ICD-10-CM | POA: Diagnosis not present

## 2017-02-22 DIAGNOSIS — N183 Chronic kidney disease, stage 3 unspecified: Secondary | ICD-10-CM

## 2017-02-22 DIAGNOSIS — D631 Anemia in chronic kidney disease: Secondary | ICD-10-CM | POA: Diagnosis not present

## 2017-02-22 DIAGNOSIS — I714 Abdominal aortic aneurysm, without rupture: Secondary | ICD-10-CM | POA: Diagnosis not present

## 2017-02-22 DIAGNOSIS — E039 Hypothyroidism, unspecified: Secondary | ICD-10-CM | POA: Diagnosis not present

## 2017-02-22 DIAGNOSIS — I428 Other cardiomyopathies: Secondary | ICD-10-CM

## 2017-02-22 DIAGNOSIS — I25708 Atherosclerosis of coronary artery bypass graft(s), unspecified, with other forms of angina pectoris: Secondary | ICD-10-CM

## 2017-02-22 DIAGNOSIS — I4819 Other persistent atrial fibrillation: Secondary | ICD-10-CM

## 2017-02-22 DIAGNOSIS — R2681 Unsteadiness on feet: Secondary | ICD-10-CM | POA: Diagnosis not present

## 2017-02-22 DIAGNOSIS — I130 Hypertensive heart and chronic kidney disease with heart failure and stage 1 through stage 4 chronic kidney disease, or unspecified chronic kidney disease: Secondary | ICD-10-CM | POA: Diagnosis not present

## 2017-02-22 DIAGNOSIS — I5022 Chronic systolic (congestive) heart failure: Secondary | ICD-10-CM | POA: Diagnosis not present

## 2017-02-22 DIAGNOSIS — I447 Left bundle-branch block, unspecified: Secondary | ICD-10-CM | POA: Diagnosis not present

## 2017-02-22 DIAGNOSIS — M48061 Spinal stenosis, lumbar region without neurogenic claudication: Secondary | ICD-10-CM | POA: Diagnosis not present

## 2017-02-22 DIAGNOSIS — M1612 Unilateral primary osteoarthritis, left hip: Secondary | ICD-10-CM | POA: Diagnosis not present

## 2017-02-22 NOTE — Progress Notes (Signed)
[name] [name] is a 83 y.o. male who presents to [name]: Primary Care Sports Medicine today for leg swelling falls and poor gait.  Leg swelling: Patient continues to experience leg swelling due to CHF and dependent edema. He has stopped taking his torsemide because it makes him urinate too frequently.  He has had worsening mobility recently and can no longer weigh himself regularly.  Recent falls and poor gait: Patient notes significantly decreased and worsening gait associated with frequent falls. He has had a trial of home physical therapy and been unable to improve. He is now using a wheelchair most of the time. His physical therapist is concerned he may have Parkinson's disease. In retrospect he notes that his voice and gait have changed over the last several years. He has developed a shuffling gait and a more monotone voice.  Anticoagulation: Patient uses warfarin daily for anticoagulation due to atrial fibrillation.    Past Medical History:  Diagnosis Date  . Anemia, unspecified 04/07/2013  . Atrial fibrillation (HCC)    fibrillation/flutter  . BPH (benign prostatic hypertrophy)   . Chronic systolic CHF (congestive heart failure) (HCC)    Nonischemic DCM EF 10% s/p BiV AICD  . Coronary artery disease 2008   nonobstructive ASCAD  . Depression   . Hyperlipidemia   . Hypertension   . Hypothyroidism   . Left bundle branch block   . Nonischemic cardiomyopathy (HCC)    ef 10 %  . OSA (obstructive sleep apnea)    intolerant to CPAP  . Osteoarthritis   . Sleep apnea    stopped using CPAP  . Thrombocytopenia (HCC)    Past Surgical History:  Procedure Laterality Date  . CARDIAC DEFIBRILLATOR PLACEMENT     left chest  . CARDIOVERSION N/A 12/29/2012   Procedure: CARDIOVERSION;  Surgeon: Quintella Reichert, MD;  Location: [facility];  Service: Cardiovascular;  Laterality: N/A;  .  CHOLECYSTECTOMY    . Colonscopy     reportedly negative per pt; around 2005.    . EP IMPLANTABLE DEVICE N/A 05/09/2016   Procedure: BIVI ICD Generator Changeout;  Surgeon: Duke Salvia, MD;  Location: [facility] INVASIVE CV LAB;  Service: Cardiovascular;  Laterality: N/A;  . TRANSURETHRAL RESECTION OF PROSTATE     for BPH   Social History  Substance Use Topics  . Smoking status: Never Smoker  . Smokeless tobacco: Never Used  . Alcohol use No   family history includes Cancer in his maternal uncle; Heart disease in his father; Heart failure in his mother; Stroke in his mother.  ROS as above:  Medications: Current Outpatient Prescriptions  Medication Sig Dispense Refill  . acetaminophen (TYLENOL) 650 MG CR tablet Take 1 tablet (650 mg total) by mouth every 8 (eight) hours as needed for pain. 90 tablet 3  . AMBULATORY NON FORMULARY MEDICATION Front wheel rolling [place] for use as needed due to gait instability. Dispense 1   Fax to [phone]  R26.81 1 each 0  . diclofenac sodium (VOLTAREN) 1 % GEL Apply 4 g topically 4 (four) times daily. To affected joint. 100 g 11  . digoxin (DIGOX) 0.125 MG tablet Take 1 tablet (125 mcg total) by mouth every other day. 45 tablet 2  . levothyroxine (SYNTHROID, LEVOTHROID) 137 MCG tablet TAKE 1 TABLET BY MOUTH  DAILY BEFORE BREAKFAST 90 tablet 1  . potassium chloride SA (K-DUR,KLOR-CON) 20 MEQ tablet Take 1 tablet (20 mEq total) by mouth daily as  needed (with each torsemide dose). 10 tablet 10  . ramipril (ALTACE) 2.5 MG capsule Take 2.5 mg by mouth daily.    . simvastatin (ZOCOR) 10 MG tablet Take 1 tablet (10 mg total) by mouth every evening. 90 tablet 2  . spironolactone (ALDACTONE) 25 MG tablet Take 0.5 tablets (12.5 mg total) by mouth daily. 45 tablet 3  . torsemide (DEMADEX) 20 MG tablet Take 1 tablet (20 mg total) by mouth daily as needed (weight gain). Take 1 tablet for 2lb wt gain overnight or 3 lbs in a week. (Patient not taking: Reported on  02/22/2017) 30 tablet 6   No current facility-administered medications for this visit.    Allergies  Allergen Reactions  . Amiodarone Other (See Comments)    Neuro toxicity    Health Maintenance Health Maintenance  Topic Date Due  . PNA vac Low Risk Adult (1 of 2 - PCV13) 05/29/1999  . INFLUENZA VACCINE  01/23/2017  . TETANUS/TDAP  07/01/2019     Exam:  BP 110/67   Pulse 73   Wt 205 lb (93 kg)   SpO2 97%   BMI 24.95 kg/m   Wt Readings from Last 10 Encounters:  02/22/17 205 lb (93 kg)  02/04/17 208 lb (94.3 kg)  01/30/17 210 lb 8 oz (95.5 kg)  01/03/17 209 lb (94.8 kg)  12/07/16 210 lb (95.3 kg)  12/03/16 208 lb 2 oz (94.4 kg)  11/27/16 211 lb (95.7 kg)  11/26/16 202 lb (91.6 kg)  11/21/16 212 lb (96.2 kg)  11/06/16 202 lb 3.2 oz (91.7 kg)    Gen: Well NAD HEENT: EOMI,  MMM Lungs: Normal work of breathing. CTABL Heart: RRR no MRG Abd: NABS, Soft. Nondistended, Nontender Exts: Brisk capillary refill, warm and well perfused. 1+ pitting edema bilateral lower extremities Neuro: Alert and oriented decreased facial motion. Monotone voice. Mild cogwheeling present bilateral upper extremities. No rest tremor.    Chemistry      Component Value Date/Time   NA 140 01/30/2017 1230   NA 142 11/05/2016 1221   K 3.7 01/30/2017 1230   CL 106 01/30/2017 1230   CO2 26 01/30/2017 1230   BUN 16 01/30/2017 1230   BUN 29 (H) 11/05/2016 1221   CREATININE 1.09 01/30/2017 1230   CREATININE 1.72 (H) 10/29/2016 1548      Component Value Date/Time   CALCIUM 8.8 (L) 01/30/2017 1230   ALKPHOS 81 11/05/2016 1221   AST 14 11/05/2016 1221   ALT 14 11/05/2016 1221   BILITOT 0.7 11/05/2016 1221       No results found for this or any previous visit (from the past 72 hour(s)). No results found.    Assessment and Plan: 83 y.o. male with  Leg swelling: Very likely because patient discontinued his diuretic. Recommend continuing to use torsemide intermittently. Recheck in a few days  when he returns on September 10.  Poor gait and frequent falls: Patient has significant deconditioning and is failing conservative management. We had a long discussion about safety at home. I think he do better in a skilled nursing facility but he refuses. I think he has decision-making capacity.  Parkinson's disease is certainly a possibility. Plan to refer to neurology for further evaluation of this and to start treatment.  Anticoagulation: Patient is on warfarin and now is having more frequent falls. I think the balance of risk has shifted such that he should discontinue warfarin. I think he is at more risk from bleeding from a fall and he  is from stroke.  We had a lengthy discussion about this as well.   Patient will return on September 10 for high-dose influenza vaccine.  I spent 40 minutes with this patient, greater than 50% was face-to-face time counseling regarding falls gait Parkinson's disease risk on and off of warfarin.   Orders Placed This Encounter  Procedures  . Ambulatory referral to Neurology    Referral Priority:   Routine    Referral Type:   Consultation    Referral Reason:   Specialty Services Required    Requested Specialty:   Neurology    Number of Visits Requested:   1   No orders of the defined types were placed in this encounter.    Discussed warning signs or symptoms. Please see discharge instructions. Patient expresses understanding.

## 2017-02-22 NOTE — Patient Instructions (Signed)
Thank you for coming in today. STOP warfarin.   Take torsemide daily or twice daily based on swelling.   Recheck Sep 10th.   You should hear from Neurology soon.   Let me know if you do not hear from them soon.

## 2017-02-22 NOTE — Telephone Encounter (Signed)
Rosey Bath with [facility] called to report patient experienced a fall on 8/30/218 and suffered an abrasion to the right hand.  No other injuries.  EMS did come to the home and evaluated/ treated patient.  Tiajuana Amass, CMA

## 2017-02-26 DIAGNOSIS — I428 Other cardiomyopathies: Secondary | ICD-10-CM | POA: Diagnosis not present

## 2017-02-26 DIAGNOSIS — I2510 Atherosclerotic heart disease of native coronary artery without angina pectoris: Secondary | ICD-10-CM | POA: Diagnosis not present

## 2017-02-26 DIAGNOSIS — M48061 Spinal stenosis, lumbar region without neurogenic claudication: Secondary | ICD-10-CM | POA: Diagnosis not present

## 2017-02-26 DIAGNOSIS — I714 Abdominal aortic aneurysm, without rupture: Secondary | ICD-10-CM | POA: Diagnosis not present

## 2017-02-26 DIAGNOSIS — M170 Bilateral primary osteoarthritis of knee: Secondary | ICD-10-CM | POA: Diagnosis not present

## 2017-02-26 DIAGNOSIS — I130 Hypertensive heart and chronic kidney disease with heart failure and stage 1 through stage 4 chronic kidney disease, or unspecified chronic kidney disease: Secondary | ICD-10-CM | POA: Diagnosis not present

## 2017-02-26 DIAGNOSIS — N183 Chronic kidney disease, stage 3 (moderate): Secondary | ICD-10-CM | POA: Diagnosis not present

## 2017-02-26 DIAGNOSIS — E039 Hypothyroidism, unspecified: Secondary | ICD-10-CM | POA: Diagnosis not present

## 2017-02-26 DIAGNOSIS — D631 Anemia in chronic kidney disease: Secondary | ICD-10-CM | POA: Diagnosis not present

## 2017-02-26 DIAGNOSIS — M1612 Unilateral primary osteoarthritis, left hip: Secondary | ICD-10-CM | POA: Diagnosis not present

## 2017-02-26 DIAGNOSIS — I447 Left bundle-branch block, unspecified: Secondary | ICD-10-CM | POA: Diagnosis not present

## 2017-02-26 DIAGNOSIS — I481 Persistent atrial fibrillation: Secondary | ICD-10-CM | POA: Diagnosis not present

## 2017-02-26 DIAGNOSIS — I5022 Chronic systolic (congestive) heart failure: Secondary | ICD-10-CM | POA: Diagnosis not present

## 2017-03-01 ENCOUNTER — Other Ambulatory Visit: Payer: Self-pay | Admitting: Cardiology

## 2017-03-01 DIAGNOSIS — I714 Abdominal aortic aneurysm, without rupture: Secondary | ICD-10-CM | POA: Diagnosis not present

## 2017-03-01 DIAGNOSIS — I5022 Chronic systolic (congestive) heart failure: Secondary | ICD-10-CM | POA: Diagnosis not present

## 2017-03-01 DIAGNOSIS — M1612 Unilateral primary osteoarthritis, left hip: Secondary | ICD-10-CM | POA: Diagnosis not present

## 2017-03-01 DIAGNOSIS — N183 Chronic kidney disease, stage 3 (moderate): Secondary | ICD-10-CM | POA: Diagnosis not present

## 2017-03-01 DIAGNOSIS — E039 Hypothyroidism, unspecified: Secondary | ICD-10-CM | POA: Diagnosis not present

## 2017-03-01 DIAGNOSIS — D631 Anemia in chronic kidney disease: Secondary | ICD-10-CM | POA: Diagnosis not present

## 2017-03-01 DIAGNOSIS — M48061 Spinal stenosis, lumbar region without neurogenic claudication: Secondary | ICD-10-CM | POA: Diagnosis not present

## 2017-03-01 DIAGNOSIS — M170 Bilateral primary osteoarthritis of knee: Secondary | ICD-10-CM | POA: Diagnosis not present

## 2017-03-01 DIAGNOSIS — I130 Hypertensive heart and chronic kidney disease with heart failure and stage 1 through stage 4 chronic kidney disease, or unspecified chronic kidney disease: Secondary | ICD-10-CM | POA: Diagnosis not present

## 2017-03-01 DIAGNOSIS — I447 Left bundle-branch block, unspecified: Secondary | ICD-10-CM | POA: Diagnosis not present

## 2017-03-01 DIAGNOSIS — I428 Other cardiomyopathies: Secondary | ICD-10-CM | POA: Diagnosis not present

## 2017-03-01 DIAGNOSIS — I2510 Atherosclerotic heart disease of native coronary artery without angina pectoris: Secondary | ICD-10-CM | POA: Diagnosis not present

## 2017-03-01 DIAGNOSIS — I481 Persistent atrial fibrillation: Secondary | ICD-10-CM | POA: Diagnosis not present

## 2017-03-04 ENCOUNTER — Ambulatory Visit (INDEPENDENT_AMBULATORY_CARE_PROVIDER_SITE_OTHER): Payer: Medicare Other | Admitting: Family Medicine

## 2017-03-04 ENCOUNTER — Ambulatory Visit: Payer: Medicare Other | Admitting: Family Medicine

## 2017-03-04 VITALS — BP 108/67 | HR 77 | Temp 97.8°F | Ht 76.0 in | Wt 207.1 lb

## 2017-03-04 DIAGNOSIS — R2681 Unsteadiness on feet: Secondary | ICD-10-CM | POA: Diagnosis not present

## 2017-03-04 DIAGNOSIS — I481 Persistent atrial fibrillation: Secondary | ICD-10-CM | POA: Diagnosis not present

## 2017-03-04 DIAGNOSIS — N183 Chronic kidney disease, stage 3 unspecified: Secondary | ICD-10-CM

## 2017-03-04 DIAGNOSIS — D696 Thrombocytopenia, unspecified: Secondary | ICD-10-CM | POA: Diagnosis not present

## 2017-03-04 DIAGNOSIS — Z23 Encounter for immunization: Secondary | ICD-10-CM

## 2017-03-04 DIAGNOSIS — I5023 Acute on chronic systolic (congestive) heart failure: Secondary | ICD-10-CM

## 2017-03-04 DIAGNOSIS — R609 Edema, unspecified: Secondary | ICD-10-CM | POA: Diagnosis not present

## 2017-03-04 DIAGNOSIS — I25708 Atherosclerosis of coronary artery bypass graft(s), unspecified, with other forms of angina pectoris: Secondary | ICD-10-CM

## 2017-03-04 DIAGNOSIS — I447 Left bundle-branch block, unspecified: Secondary | ICD-10-CM | POA: Diagnosis not present

## 2017-03-04 DIAGNOSIS — I10 Essential (primary) hypertension: Secondary | ICD-10-CM

## 2017-03-04 DIAGNOSIS — I428 Other cardiomyopathies: Secondary | ICD-10-CM

## 2017-03-04 DIAGNOSIS — Z7901 Long term (current) use of anticoagulants: Secondary | ICD-10-CM

## 2017-03-04 DIAGNOSIS — I4819 Other persistent atrial fibrillation: Secondary | ICD-10-CM

## 2017-03-04 MED ORDER — WARFARIN SODIUM 1 MG PO TABS: ORAL_TABLET | ORAL | 1 refills | Status: DC

## 2017-03-04 MED ORDER — WARFARIN SODIUM 5 MG PO TABS
5.0000 mg | ORAL_TABLET | Freq: Every day | ORAL | 0 refills | Status: DC
Start: 2017-03-04 — End: 2018-02-21

## 2017-03-04 NOTE — Progress Notes (Signed)
[name] [name] is a 83 y.o. male who presents to [name]: Primary Care Sports Medicine today for follow up leg swelling, anticoag, and mobility.   Leg swelling: Patient has continued leg swelling. This is thought to be attributable to his multiple cardiac comorbidities including heart failure as well as venous insufficiency and dependent edema. He notes the swelling has improved a bit recently. He has not increased his torsemide dose. He only takes it when absolutely necessary because he does not like the frequent urination that it causes. He does however follow a low salt diet.  Cardiac disease: Patient has multiple cardiac disease as noted above. His primary cardiologist is Dr. Mayford Knife in [place]. He has poor mobility and wishes to transfer for the majority of his cardiac care to the [place] location. He also continues to see Dr. Shirlee Latch at the heart failure clinic and Dr. Clide Cliff for his pacer.   Mobility. Tyleek notes slight improved mobility. He continues to use a Garrels to ambulate.  Anticoagulation: Patient continues to take warfarin. We've had discussions about safety of this medication with his frequent falls.   Past Medical History:  Diagnosis Date  . Anemia, unspecified 04/07/2013  . Atrial fibrillation (HCC)    fibrillation/flutter  . BPH (benign prostatic hypertrophy)   . Chronic systolic CHF (congestive heart failure) (HCC)    Nonischemic DCM EF 10% s/p BiV AICD  . Coronary artery disease 2008   nonobstructive ASCAD  . Depression   . Hyperlipidemia   . Hypertension   . Hypothyroidism   . Left bundle branch block   . Nonischemic cardiomyopathy (HCC)    ef 10 %  . OSA (obstructive sleep apnea)    intolerant to CPAP  . Osteoarthritis   . Sleep apnea    stopped using CPAP  . Thrombocytopenia (HCC)    Past Surgical History:  Procedure Laterality Date  . CARDIAC  DEFIBRILLATOR PLACEMENT     left chest  . CARDIOVERSION N/A 12/29/2012   Procedure: CARDIOVERSION;  Surgeon: Quintella Reichert, MD;  [facility];  Service: Cardiovascular;  Laterality: N/A;  . CHOLECYSTECTOMY    . Colonscopy     reportedly negative per pt; around 2005.    . EP IMPLANTABLE DEVICE N/A 05/09/2016   Procedure: BIVI ICD Generator Changeout;  Surgeon: Duke Salvia, MD;  Location: [facility] INVASIVE CV LAB;  Service: Cardiovascular;  Laterality: N/A;  . TRANSURETHRAL RESECTION OF PROSTATE     for BPH   Social History  Substance Use Topics  . Smoking status: Never Smoker  . Smokeless tobacco: Never Used  . Alcohol use No   family history includes Cancer in his maternal uncle; Heart disease in his father; Heart failure in his mother; Stroke in his mother.  ROS as above:  Medications: Current Outpatient Prescriptions  Medication Sig Dispense Refill  . acetaminophen (TYLENOL) 650 MG CR tablet Take 1 tablet (650 mg total) by mouth every 8 (eight) hours as needed for pain. 90 tablet 3  . AMBULATORY NON FORMULARY MEDICATION Front wheel rolling Tracz for use as needed due to gait instability. Dispense 1   Fax to [phone]  R26.81 1 each 0  . diclofenac sodium (VOLTAREN) 1 % GEL Apply 4 g topically 4 (four) times daily. To affected joint. 100 g 11  . digoxin (DIGOX) 0.125 MG tablet Take 1 tablet (125 mcg total) by mouth every other day. 45 tablet 2  . levothyroxine (SYNTHROID, LEVOTHROID) 137 MCG  tablet TAKE 1 TABLET BY MOUTH  DAILY BEFORE BREAKFAST 90 tablet 1  . potassium chloride SA (K-DUR,KLOR-CON) 20 MEQ tablet Take 1 tablet (20 mEq total) by mouth daily as needed (with each torsemide dose). 10 tablet 10  . ramipril (ALTACE) 2.5 MG capsule Take 2.5 mg by mouth daily.    . simvastatin (ZOCOR) 10 MG tablet Take 1 tablet (10 mg total) by mouth every evening. 90 tablet 2  . spironolactone (ALDACTONE) 25 MG tablet Take 0.5 tablets (12.5 mg total) by mouth daily. 45 tablet 2   . torsemide (DEMADEX) 20 MG tablet Take 1 tablet (20 mg total) by mouth daily as needed (weight gain). Take 1 tablet for 2lb wt gain overnight or 3 lbs in a week. 30 tablet 6  . warfarin (COUMADIN) 1 MG tablet Take  pill in addition to  pill on Tuesday and Friday 30 tablet 1  . warfarin (COUMADIN) 5 MG tablet Take 1 tablet (5 mg total) by mouth daily at 6 PM. 30 tablet 0   No current facility-administered medications for this visit.    Allergies  Allergen Reactions  . Amiodarone Other (See Comments)    Neuro toxicity    Health Maintenance Health Maintenance  Topic Date Due  . PNA vac Low Risk Adult (1 of 2 - PCV13) 05/29/1999  . TETANUS/TDAP  07/01/2019  . INFLUENZA VACCINE  Completed     Exam:  BP 108/67   Pulse 77   Temp 97.8 F (36.6 C)   Ht  (1.93 m)   Wt 207 lb 1.9 oz (93.9 kg)   SpO2 97%   BMI 25.21 kg/m   Wt Readings from Last 10 Encounters:  03/04/17 207 lb 1.9 oz (93.9 kg)  02/22/17 205 lb (93 kg)  02/04/17 208 lb (94.3 kg)  01/30/17 210 lb 8 oz (95.5 kg)  01/03/17 209 lb (94.8 kg)  12/07/16 210 lb (95.3 kg)  12/03/16 208 lb 2 oz (94.4 kg)  11/27/16 211 lb (95.7 kg)  11/26/16 202 lb (91.6 kg)  11/21/16 212 lb (96.2 kg)    Gen: Well NAD HEENT: EOMI,  MMM Lungs: Normal work of breathing. CTABL Heart: Regular rate no loud murmur Abd: NABS, Soft. Nondistended, Nontender Exts: Brisk capillary refill, warm and well perfused. 1+ edema bilateral lower extremities   No results found for this or any previous visit (from the past 72 hour(s)). No results found.    Assessment and Plan: 83 y.o. male with  Leg edema: We had a lengthy discussion about torsemide. I recommend that he take it more than he really wants to. Recommend daily weights as well as low-salt diet. Follow-up with cardiology and myself. Recheck in one month. Check metabolic panel.  Coagulation: We had a lengthy discussion of pros and cons of warfarin with frequent falls. Patient  elects to continue warfarin. Check INR today.  Hypothyroidism: Check TSH  Mobility: Recommend continued use of Crance.  Influenza vaccine given prior to discharge Orders Placed This Encounter  Procedures  . Flu vaccine HIGH DOSE PF (Fluzone High dose)  . CBC  . COMPLETE METABOLIC PANEL WITH GFR  . INR/PT  . TSH  . Ambulatory referral to Cardiology    Referral Priority:   Routine    Referral Type:   Consultation    Referral Reason:   Specialty Services Required    Requested Specialty:   Cardiology    Number of Visits Requested:   1     Discussed warning signs or  symptoms. Please see discharge instructions. Patient expresses understanding.  I spent 25 minutes with this patient, greater than 50% was face-to-face time counseling regarding safety of warfarin.

## 2017-03-04 NOTE — Patient Instructions (Addendum)
Thank you for coming in today. Use over-the-counter Zaditor eyedrops (Ketotifen) Use Systane artificial tears as needed  Get labs today.   Neurology: Dr. Lorelee Cover, MD Address: [address], [place], [place] [address] Hours:  Open ? Closes 5PM Phone: [phone]  Recheck in 1 month.   You should hear about Cardiology here in [place].

## 2017-03-05 LAB — COMPLETE METABOLIC PANEL WITH GFR
AG Ratio: 2 (calc) (ref 1.0–2.5)
ALBUMIN MSPROF: 4.4 g/dL (ref 3.6–5.1)
ALT: 15 U/L (ref 9–46)
AST: 17 U/L (ref 10–35)
Alkaline phosphatase (APISO): 93 U/L (ref 40–115)
BILIRUBIN TOTAL: 0.7 mg/dL (ref 0.2–1.2)
BUN / CREAT RATIO: 17 (calc) (ref 6–22)
BUN: 23 mg/dL (ref 7–25)
CO2: 32 mmol/L (ref 20–32)
CREATININE: 1.37 mg/dL — AB (ref 0.70–1.11)
Calcium: 9.5 mg/dL (ref 8.6–10.3)
Chloride: 103 mmol/L (ref 98–110)
GFR, EST AFRICAN AMERICAN: 55 mL/min/{1.73_m2} — AB (ref >=60)
GFR, Est Non African American: 48 mL/min/{1.73_m2} — ABNORMAL LOW (ref >=60)
GLOBULIN: 2.2 g/dL (ref 1.9–3.7)
Glucose, Bld: 104 mg/dL — ABNORMAL HIGH (ref 65–99)
Potassium: 4.4 mmol/L (ref 3.5–5.3)
Sodium: 141 mmol/L (ref 135–146)
TOTAL PROTEIN: 6.6 g/dL (ref 6.1–8.1)

## 2017-03-05 LAB — CBC
HCT: 41.8 % (ref 38.5–50.0)
Hemoglobin: 13.9 g/dL (ref 13.2–17.1)
MCH: 30 pg (ref 27.0–33.0)
MCHC: 33.3 g/dL (ref 32.0–36.0)
MCV: 90.3 fL (ref 80.0–100.0)
MPV: 11.7 fL (ref 7.5–12.5)
PLATELETS: 142 10*3/uL (ref 140–400)
RBC: 4.63 10*6/uL (ref 4.20–5.80)
RDW: 13 % (ref 11.0–15.0)
WBC: 5.2 10*3/uL (ref 3.8–10.8)

## 2017-03-05 LAB — PROTIME-INR
INR: 1.8 — ABNORMAL HIGH
Prothrombin Time: 19.1 s — ABNORMAL HIGH (ref 9.0–11.5)

## 2017-03-05 LAB — TSH: TSH: 1.49 mIU/L (ref 0.40–4.50)

## 2017-03-07 ENCOUNTER — Telehealth: Payer: Self-pay | Admitting: Cardiology

## 2017-03-07 DIAGNOSIS — I5022 Chronic systolic (congestive) heart failure: Secondary | ICD-10-CM | POA: Diagnosis not present

## 2017-03-07 DIAGNOSIS — I2510 Atherosclerotic heart disease of native coronary artery without angina pectoris: Secondary | ICD-10-CM | POA: Diagnosis not present

## 2017-03-07 DIAGNOSIS — M1612 Unilateral primary osteoarthritis, left hip: Secondary | ICD-10-CM | POA: Diagnosis not present

## 2017-03-07 DIAGNOSIS — I447 Left bundle-branch block, unspecified: Secondary | ICD-10-CM | POA: Diagnosis not present

## 2017-03-07 DIAGNOSIS — M170 Bilateral primary osteoarthritis of knee: Secondary | ICD-10-CM | POA: Diagnosis not present

## 2017-03-07 DIAGNOSIS — D631 Anemia in chronic kidney disease: Secondary | ICD-10-CM | POA: Diagnosis not present

## 2017-03-07 DIAGNOSIS — E039 Hypothyroidism, unspecified: Secondary | ICD-10-CM | POA: Diagnosis not present

## 2017-03-07 DIAGNOSIS — N183 Chronic kidney disease, stage 3 (moderate): Secondary | ICD-10-CM | POA: Diagnosis not present

## 2017-03-07 DIAGNOSIS — I714 Abdominal aortic aneurysm, without rupture: Secondary | ICD-10-CM | POA: Diagnosis not present

## 2017-03-07 DIAGNOSIS — M48061 Spinal stenosis, lumbar region without neurogenic claudication: Secondary | ICD-10-CM | POA: Diagnosis not present

## 2017-03-07 DIAGNOSIS — I130 Hypertensive heart and chronic kidney disease with heart failure and stage 1 through stage 4 chronic kidney disease, or unspecified chronic kidney disease: Secondary | ICD-10-CM | POA: Diagnosis not present

## 2017-03-07 DIAGNOSIS — I481 Persistent atrial fibrillation: Secondary | ICD-10-CM | POA: Diagnosis not present

## 2017-03-07 DIAGNOSIS — I428 Other cardiomyopathies: Secondary | ICD-10-CM | POA: Diagnosis not present

## 2017-03-07 NOTE — Telephone Encounter (Signed)
New message    We received a referral for pt to schedule an appt in [place]. Pt is wanting to switch providers so he can be seen in [place]. Is this ok?

## 2017-03-07 NOTE — Telephone Encounter (Signed)
Ok with me [name]  

## 2017-03-07 NOTE — Telephone Encounter (Signed)
yes

## 2017-03-08 DIAGNOSIS — I5022 Chronic systolic (congestive) heart failure: Secondary | ICD-10-CM | POA: Diagnosis not present

## 2017-03-08 DIAGNOSIS — I428 Other cardiomyopathies: Secondary | ICD-10-CM | POA: Diagnosis not present

## 2017-03-08 DIAGNOSIS — E039 Hypothyroidism, unspecified: Secondary | ICD-10-CM | POA: Diagnosis not present

## 2017-03-08 DIAGNOSIS — D631 Anemia in chronic kidney disease: Secondary | ICD-10-CM | POA: Diagnosis not present

## 2017-03-08 DIAGNOSIS — I2510 Atherosclerotic heart disease of native coronary artery without angina pectoris: Secondary | ICD-10-CM | POA: Diagnosis not present

## 2017-03-08 DIAGNOSIS — M1612 Unilateral primary osteoarthritis, left hip: Secondary | ICD-10-CM | POA: Diagnosis not present

## 2017-03-08 DIAGNOSIS — M48061 Spinal stenosis, lumbar region without neurogenic claudication: Secondary | ICD-10-CM | POA: Diagnosis not present

## 2017-03-08 DIAGNOSIS — M170 Bilateral primary osteoarthritis of knee: Secondary | ICD-10-CM | POA: Diagnosis not present

## 2017-03-08 DIAGNOSIS — I481 Persistent atrial fibrillation: Secondary | ICD-10-CM | POA: Diagnosis not present

## 2017-03-08 DIAGNOSIS — I130 Hypertensive heart and chronic kidney disease with heart failure and stage 1 through stage 4 chronic kidney disease, or unspecified chronic kidney disease: Secondary | ICD-10-CM | POA: Diagnosis not present

## 2017-03-08 DIAGNOSIS — N183 Chronic kidney disease, stage 3 (moderate): Secondary | ICD-10-CM | POA: Diagnosis not present

## 2017-03-08 DIAGNOSIS — I447 Left bundle-branch block, unspecified: Secondary | ICD-10-CM | POA: Diagnosis not present

## 2017-03-08 DIAGNOSIS — I714 Abdominal aortic aneurysm, without rupture: Secondary | ICD-10-CM | POA: Diagnosis not present

## 2017-03-08 NOTE — Telephone Encounter (Signed)
Printed and given to schedulers for appropriate OV arrangement.

## 2017-03-12 DIAGNOSIS — E039 Hypothyroidism, unspecified: Secondary | ICD-10-CM | POA: Diagnosis not present

## 2017-03-12 DIAGNOSIS — I428 Other cardiomyopathies: Secondary | ICD-10-CM | POA: Diagnosis not present

## 2017-03-12 DIAGNOSIS — N183 Chronic kidney disease, stage 3 (moderate): Secondary | ICD-10-CM | POA: Diagnosis not present

## 2017-03-12 DIAGNOSIS — I481 Persistent atrial fibrillation: Secondary | ICD-10-CM | POA: Diagnosis not present

## 2017-03-12 DIAGNOSIS — I447 Left bundle-branch block, unspecified: Secondary | ICD-10-CM | POA: Diagnosis not present

## 2017-03-12 DIAGNOSIS — M170 Bilateral primary osteoarthritis of knee: Secondary | ICD-10-CM | POA: Diagnosis not present

## 2017-03-12 DIAGNOSIS — I130 Hypertensive heart and chronic kidney disease with heart failure and stage 1 through stage 4 chronic kidney disease, or unspecified chronic kidney disease: Secondary | ICD-10-CM | POA: Diagnosis not present

## 2017-03-12 DIAGNOSIS — I5022 Chronic systolic (congestive) heart failure: Secondary | ICD-10-CM | POA: Diagnosis not present

## 2017-03-12 DIAGNOSIS — I2510 Atherosclerotic heart disease of native coronary artery without angina pectoris: Secondary | ICD-10-CM | POA: Diagnosis not present

## 2017-03-12 DIAGNOSIS — I714 Abdominal aortic aneurysm, without rupture: Secondary | ICD-10-CM | POA: Diagnosis not present

## 2017-03-12 DIAGNOSIS — M48061 Spinal stenosis, lumbar region without neurogenic claudication: Secondary | ICD-10-CM | POA: Diagnosis not present

## 2017-03-12 DIAGNOSIS — D631 Anemia in chronic kidney disease: Secondary | ICD-10-CM | POA: Diagnosis not present

## 2017-03-12 DIAGNOSIS — M1612 Unilateral primary osteoarthritis, left hip: Secondary | ICD-10-CM | POA: Diagnosis not present

## 2017-03-14 ENCOUNTER — Ambulatory Visit (INDEPENDENT_AMBULATORY_CARE_PROVIDER_SITE_OTHER): Payer: Medicare Other | Admitting: Family Medicine

## 2017-03-14 ENCOUNTER — Encounter: Payer: Self-pay | Admitting: Family Medicine

## 2017-03-14 VITALS — BP 125/73 | HR 75

## 2017-03-14 DIAGNOSIS — I481 Persistent atrial fibrillation: Secondary | ICD-10-CM | POA: Diagnosis not present

## 2017-03-14 DIAGNOSIS — R2689 Other abnormalities of gait and mobility: Secondary | ICD-10-CM | POA: Diagnosis not present

## 2017-03-14 DIAGNOSIS — I4891 Unspecified atrial fibrillation: Secondary | ICD-10-CM | POA: Diagnosis not present

## 2017-03-14 DIAGNOSIS — G20 Parkinson's disease: Secondary | ICD-10-CM | POA: Diagnosis not present

## 2017-03-14 DIAGNOSIS — I5023 Acute on chronic systolic (congestive) heart failure: Secondary | ICD-10-CM | POA: Diagnosis not present

## 2017-03-14 DIAGNOSIS — E039 Hypothyroidism, unspecified: Secondary | ICD-10-CM | POA: Diagnosis not present

## 2017-03-14 DIAGNOSIS — Z7901 Long term (current) use of anticoagulants: Secondary | ICD-10-CM

## 2017-03-14 DIAGNOSIS — I509 Heart failure, unspecified: Secondary | ICD-10-CM | POA: Diagnosis not present

## 2017-03-14 DIAGNOSIS — I4819 Other persistent atrial fibrillation: Secondary | ICD-10-CM

## 2017-03-14 LAB — POCT INR: INR: 1.9

## 2017-03-14 NOTE — Progress Notes (Signed)
[name] [name] is a 83 y.o. male who presents to [name]: Primary Care Sports Medicine today for follow up leg swelling.  Ly notes continued but improved leg swelling recently. He was reluctant to  take the torsemide because it causes frequent urination. He is feeling better now on more frequent doses of torsemide.   Past Medical History:  Diagnosis Date  . Anemia, unspecified 04/07/2013  . Atrial fibrillation (HCC)    fibrillation/flutter  . BPH (benign prostatic hypertrophy)   . Chronic systolic CHF (congestive heart failure) (HCC)    Nonischemic DCM EF 10% s/p BiV AICD  . Coronary artery disease 2008   nonobstructive ASCAD  . Depression   . Hyperlipidemia   . Hypertension   . Hypothyroidism   . Left bundle branch block   . Nonischemic cardiomyopathy (HCC)    ef 10 %  . OSA (obstructive sleep apnea)    intolerant to CPAP  . Osteoarthritis   . Sleep apnea    stopped using CPAP  . Thrombocytopenia (HCC)    Past Surgical History:  Procedure Laterality Date  . CARDIAC DEFIBRILLATOR PLACEMENT     left chest  . CARDIOVERSION N/A 12/29/2012   Procedure: CARDIOVERSION;  Surgeon: Quintella Reichert, MD;  Location: [facility];  Service: Cardiovascular;  Laterality: N/A;  . CHOLECYSTECTOMY    . Colonscopy     reportedly negative per pt; around 2005.    . EP IMPLANTABLE DEVICE N/A 05/09/2016   Procedure: BIVI ICD Generator Changeout;  Surgeon: Duke Salvia, MD;  Location: [facility] INVASIVE CV LAB;  Service: Cardiovascular;  Laterality: N/A;  . TRANSURETHRAL RESECTION OF PROSTATE     for BPH   Social History  Substance Use Topics  . Smoking status: Never Smoker  . Smokeless tobacco: Never Used  . Alcohol use No   family history includes Cancer in his maternal uncle; Heart disease in his father; Heart failure in his mother; Stroke in his mother.  ROS as above:  Medications: Current  Outpatient Prescriptions  Medication Sig Dispense Refill  . acetaminophen (TYLENOL) 650 MG CR tablet Take 1 tablet (650 mg total) by mouth every 8 (eight) hours as needed for pain. 90 tablet 3  . AMBULATORY NON FORMULARY MEDICATION Front wheel rolling Rojek for use as needed due to gait instability. Dispense 1   Fax to [phone]  R26.81 1 each 0  . diclofenac sodium (VOLTAREN) 1 % GEL Apply 4 g topically 4 (four) times daily. To affected joint. 100 g 11  . digoxin (DIGOX) 0.125 MG tablet Take 1 tablet (125 mcg total) by mouth every other day. 45 tablet 2  . levothyroxine (SYNTHROID, LEVOTHROID) 137 MCG tablet TAKE 1 TABLET BY MOUTH  DAILY BEFORE BREAKFAST 90 tablet 1  . potassium chloride SA (K-DUR,KLOR-CON) 20 MEQ tablet Take 1 tablet (20 mEq total) by mouth daily as needed (with each torsemide dose). 10 tablet 10  . ramipril (ALTACE) 2.5 MG capsule Take 2.5 mg by mouth daily.    . simvastatin (ZOCOR) 10 MG tablet Take 1 tablet (10 mg total) by mouth every evening. 90 tablet 2  . spironolactone (ALDACTONE) 25 MG tablet Take 0.5 tablets (12.5 mg total) by mouth daily. 45 tablet 2  . torsemide (DEMADEX) 20 MG tablet Take 1 tablet (20 mg total) by mouth daily as needed (weight gain). Take 1 tablet for 2lb wt gain overnight or 3 lbs in a week. 30 tablet 6  . warfarin (  COUMADIN) 1 MG tablet Take 1mg  pill in addition to 5mg  pill on Tuesday and Friday 30 tablet 1  . warfarin (COUMADIN) 5 MG tablet Take 1 tablet (5 mg total) by mouth daily at 6 PM. 30 tablet 0   No current facility-administered medications for this visit.    Allergies  Allergen Reactions  . Amiodarone Other (See Comments)    Neuro toxicity    Health Maintenance Health Maintenance  Topic Date Due  . PNA vac Low Risk Adult (1 of 2 - PCV13) 05/29/1999  . TETANUS/TDAP  07/01/2019  . INFLUENZA VACCINE  Completed     Exam:  BP 125/73   Pulse 75   Wt Readings from Last 10 Encounters:  03/04/17 207 lb 1.9 oz (93.9 kg)    02/22/17 205 lb (93 kg)  02/04/17 208 lb (94.3 kg)  01/30/17 210 lb 8 oz (95.5 kg)  01/03/17 209 lb (94.8 kg)  12/07/16 210 lb (95.3 kg)  12/03/16 208 lb 2 oz (94.4 kg)  11/27/16 211 lb (95.7 kg)  11/26/16 202 lb (91.6 kg)  11/21/16 212 lb (96.2 kg)    Gen: Well NAD HEENT: EOMI,  MMM Lungs: Normal work of breathing. CTABL Heart: RRR no MRG Abd: NABS, Soft. Nondistended, Nontender Exts: Brisk capillary refill, warm and well perfused. Trace edema bilateral lower extremities   Results for orders placed or performed in visit on 03/14/17 (from the past 72 hour(s))  POCT INR     Status: None   Collection Time: 03/14/17 11:17 AM  Result Value Ref Range   INR 1.9    No results found.    Assessment and Plan: 83 y.o. male with  Leg swelling. Doing well on a more frequent dose of torsemide. Continue daily torsemide if able. We'll recheck kidney function on October 15.  Continue current dose of warfarin recheck INR at the next visit.   Orders Placed This Encounter  Procedures  . POCT INR   No orders of the defined types were placed in this encounter.    Discussed warning signs or symptoms. Please see discharge instructions. Patient expresses understanding.

## 2017-03-14 NOTE — Patient Instructions (Signed)
Thank you for coming in today. Continue torsemide most of the days.  We will check labs next visit.  Recheck with me sooner if needed.

## 2017-03-15 DIAGNOSIS — I428 Other cardiomyopathies: Secondary | ICD-10-CM | POA: Diagnosis not present

## 2017-03-15 DIAGNOSIS — E039 Hypothyroidism, unspecified: Secondary | ICD-10-CM | POA: Diagnosis not present

## 2017-03-15 DIAGNOSIS — M1612 Unilateral primary osteoarthritis, left hip: Secondary | ICD-10-CM | POA: Diagnosis not present

## 2017-03-15 DIAGNOSIS — M48061 Spinal stenosis, lumbar region without neurogenic claudication: Secondary | ICD-10-CM | POA: Diagnosis not present

## 2017-03-15 DIAGNOSIS — I2510 Atherosclerotic heart disease of native coronary artery without angina pectoris: Secondary | ICD-10-CM | POA: Diagnosis not present

## 2017-03-15 DIAGNOSIS — M170 Bilateral primary osteoarthritis of knee: Secondary | ICD-10-CM | POA: Diagnosis not present

## 2017-03-15 DIAGNOSIS — I5022 Chronic systolic (congestive) heart failure: Secondary | ICD-10-CM | POA: Diagnosis not present

## 2017-03-15 DIAGNOSIS — I481 Persistent atrial fibrillation: Secondary | ICD-10-CM | POA: Diagnosis not present

## 2017-03-15 DIAGNOSIS — D631 Anemia in chronic kidney disease: Secondary | ICD-10-CM | POA: Diagnosis not present

## 2017-03-15 DIAGNOSIS — I130 Hypertensive heart and chronic kidney disease with heart failure and stage 1 through stage 4 chronic kidney disease, or unspecified chronic kidney disease: Secondary | ICD-10-CM | POA: Diagnosis not present

## 2017-03-15 DIAGNOSIS — I447 Left bundle-branch block, unspecified: Secondary | ICD-10-CM | POA: Diagnosis not present

## 2017-03-15 DIAGNOSIS — N183 Chronic kidney disease, stage 3 (moderate): Secondary | ICD-10-CM | POA: Diagnosis not present

## 2017-03-15 DIAGNOSIS — I714 Abdominal aortic aneurysm, without rupture: Secondary | ICD-10-CM | POA: Diagnosis not present

## 2017-03-18 ENCOUNTER — Other Ambulatory Visit: Payer: Self-pay

## 2017-03-18 MED ORDER — SIMVASTATIN 10 MG PO TABS: 10.0000 mg | ORAL_TABLET | Freq: Every evening | ORAL | 2 refills | Status: DC

## 2017-03-18 MED ORDER — DIGOXIN 125 MCG PO TABS: 125.0000 ug | ORAL_TABLET | ORAL | 2 refills | Status: DC

## 2017-03-21 MED ORDER — DIGOXIN 125 MCG PO TABS: 125.0000 ug | ORAL_TABLET | ORAL | 2 refills | Status: DC

## 2017-03-21 MED ORDER — SIMVASTATIN 10 MG PO TABS: 10.0000 mg | ORAL_TABLET | Freq: Every evening | ORAL | 2 refills | Status: DC

## 2017-03-21 NOTE — Addendum Note (Signed)
Addended by: Demetrios Loll on: 03/21/2017 09:12 AM   Modules accepted: Orders

## 2017-03-24 ENCOUNTER — Encounter: Payer: Self-pay | Admitting: Family Medicine

## 2017-03-28 ENCOUNTER — Telehealth: Payer: Self-pay | Admitting: Internal Medicine

## 2017-03-28 NOTE — Telephone Encounter (Signed)
Spoke with a patient and made him aware that he has refills with [facility] that were sent in at the end of September for #90 with 2 refills. Patient stated that he has not called and asked them to ship the medication to him. He will call them and requested that they ship it and if he has any other questions or concerns he will call the office back. Patient appreciative of my call.

## 2017-03-28 NOTE — Telephone Encounter (Signed)
Medication Detail    Disp Refills Start End   simvastatin (ZOCOR) 10 MG tablet 90 tablet 2 03/21/2017    Sig - Route: Take 1 tablet (10 mg total) by mouth every evening. - Oral   Sent to pharmacy as: simvastatin (ZOCOR) 10 MG tablet   E-Prescribing Status: Receipt confirmed by pharmacy (03/21/2017 9:11 AM EDT)   Pharmacy   [facility] - [place],  - [address]

## 2017-03-28 NOTE — Telephone Encounter (Signed)
New message    Pt states this was called into [facility] and he needs it sent into [facility]  *STAT* If patient is at the pharmacy, call can be transferred to refill team.   1. Which medications need to be refilled? (please list name of each medication and dose if known) simvastatin 10 mg  2. Which pharmacy/location (including street and city if local pharmacy) is medication to be sent to? [facility]  3. Do they need a 30 day or 90 day supply? 90 day

## 2017-04-04 ENCOUNTER — Encounter (HOSPITAL_COMMUNITY): Payer: Medicare Other | Admitting: Cardiology

## 2017-04-08 ENCOUNTER — Ambulatory Visit (INDEPENDENT_AMBULATORY_CARE_PROVIDER_SITE_OTHER): Payer: Medicare Other | Admitting: Family Medicine

## 2017-04-08 ENCOUNTER — Encounter: Payer: Self-pay | Admitting: Family Medicine

## 2017-04-08 VITALS — BP 117/71 | HR 75 | Wt 204.0 lb

## 2017-04-08 DIAGNOSIS — M170 Bilateral primary osteoarthritis of knee: Secondary | ICD-10-CM

## 2017-04-08 DIAGNOSIS — M6281 Muscle weakness (generalized): Secondary | ICD-10-CM | POA: Diagnosis not present

## 2017-04-08 DIAGNOSIS — I481 Persistent atrial fibrillation: Secondary | ICD-10-CM

## 2017-04-08 DIAGNOSIS — Z7901 Long term (current) use of anticoagulants: Secondary | ICD-10-CM

## 2017-04-08 DIAGNOSIS — R2681 Unsteadiness on feet: Secondary | ICD-10-CM | POA: Diagnosis not present

## 2017-04-08 DIAGNOSIS — I4819 Other persistent atrial fibrillation: Secondary | ICD-10-CM

## 2017-04-08 LAB — POCT INR: INR: 1.6

## 2017-04-08 MED ORDER — DICLOFENAC SODIUM 1 % TD GEL: 4.0000 g | Freq: Four times a day (QID) | TRANSDERMAL | 11 refills | Status: DC

## 2017-04-08 MED ORDER — WARFARIN SODIUM 1 MG PO TABS: ORAL_TABLET | ORAL | 1 refills | Status: DC

## 2017-04-08 NOTE — Patient Instructions (Signed)
Thank you for coming in today. Increase warfarin to 5mg  every day and 1mg  in addition to 5mg  pill on Monday, Wednesday and Friday.  Continue exercise.  Recheck in 1 month.  Return sooner if needed.

## 2017-04-08 NOTE — Progress Notes (Signed)
[name] [name] is a 83 y.o. male who presents to [name]: Primary Care Sports Medicine today for follow-up gait and leg swelling. Patient has been seen several times over the past few months for worsening weakness and impaired gait. He was ultimately referred to neurology. Since his last visit he's been seen by a neurologist who suspects Parkinsonian is him. He was started on Sinemet. He has had considerable improvement in his symptoms. He is able to walk more easily now and is regaining his strength. He notes that with increased walking and ambulation he has had much less leg swelling and no longer has to take the torsemide with any degree or frequency. He feels well. He is eager to start driving again if possible.   Past Medical History:  Diagnosis Date  . Anemia, unspecified 04/07/2013  . Atrial fibrillation (HCC)    fibrillation/flutter  . BPH (benign prostatic hypertrophy)   . Chronic systolic CHF (congestive heart failure) (HCC)    Nonischemic DCM EF 10% s/p BiV AICD  . Coronary artery disease 2008   nonobstructive ASCAD  . Depression   . Hyperlipidemia   . Hypertension   . Hypothyroidism   . Left bundle branch block   . Nonischemic cardiomyopathy (HCC)    ef 10 %  . OSA (obstructive sleep apnea)    intolerant to CPAP  . Osteoarthritis   . Sleep apnea    stopped using CPAP  . Thrombocytopenia (HCC)    Past Surgical History:  Procedure Laterality Date  . CARDIAC DEFIBRILLATOR PLACEMENT     left chest  . CARDIOVERSION N/A 12/29/2012   Procedure: CARDIOVERSION;  Surgeon: Quintella Reichert, MD;  Location: [facility];  Service: Cardiovascular;  Laterality: N/A;  . CHOLECYSTECTOMY    . Colonscopy     reportedly negative per pt; around 2005.    . EP IMPLANTABLE DEVICE N/A 05/09/2016   Procedure: BIVI ICD Generator Changeout;  Surgeon: Duke Salvia, MD;  Location: [facility] INVASIVE CV LAB;   Service: Cardiovascular;  Laterality: N/A;  . TRANSURETHRAL RESECTION OF PROSTATE     for BPH   Social History  Substance Use Topics  . Smoking status: Never Smoker  . Smokeless tobacco: Never Used  . Alcohol use No   family history includes Cancer in his maternal uncle; Heart disease in his father; Heart failure in his mother; Stroke in his mother.  ROS as above:  Medications: Current Outpatient Prescriptions  Medication Sig Dispense Refill  . acetaminophen (TYLENOL) 650 MG CR tablet Take 1 tablet (650 mg total) by mouth every 8 (eight) hours as needed for pain. 90 tablet 3  . AMBULATORY NON FORMULARY MEDICATION Front wheel rolling Buchholz for use as needed due to gait instability. Dispense 1   Fax to [phone]  R26.81 1 each 0  . carbidopa-levodopa (SINEMET IR) 25-100 MG tablet Take by mouth.    . diclofenac sodium (VOLTAREN) 1 % GEL Apply 4 g topically 4 (four) times daily. To affected joint. 100 g 11  . digoxin (DIGOX) 0.125 MG tablet Take 1 tablet (125 mcg total) by mouth every other day. 45 tablet 2  . levothyroxine (SYNTHROID, LEVOTHROID) 137 MCG tablet TAKE 1 TABLET BY MOUTH  DAILY BEFORE BREAKFAST 90 tablet 1  . potassium chloride SA (K-DUR,KLOR-CON) 20 MEQ tablet Take 1 tablet (20 mEq total) by mouth daily as needed (with each torsemide dose). 10 tablet 10  . ramipril (ALTACE) 2.5 MG capsule Take 2.5  mg by mouth daily.    . simvastatin (ZOCOR) 10 MG tablet Take 1 tablet (10 mg total) by mouth every evening. 90 tablet 2  . spironolactone (ALDACTONE) 25 MG tablet Take 0.5 tablets (12.5 mg total) by mouth daily. 45 tablet 2  . torsemide (DEMADEX) 20 MG tablet Take 1 tablet (20 mg total) by mouth daily as needed (weight gain). Take 1 tablet for 2lb wt gain overnight or 3 lbs in a week. 30 tablet 6  . warfarin (COUMADIN) 1 MG tablet Take 1mg  pill in addition to 5mg  pill on Monday, Wed, Friday 30 tablet 1  . warfarin (COUMADIN) 5 MG tablet Take 1 tablet (5 mg total) by mouth  daily at 6 PM. 30 tablet 0   No current facility-administered medications for this visit.    Allergies  Allergen Reactions  . Amiodarone Other (See Comments)    Neuro toxicity    Health Maintenance Health Maintenance  Topic Date Due  . PNA vac Low Risk Adult (1 of 2 - PCV13) 05/29/1999  . TETANUS/TDAP  07/01/2019  . INFLUENZA VACCINE  Completed     Exam:  BP 117/71   Pulse 75   Wt 204 lb (92.5 kg)   BMI 24.83 kg/m  Gen: Well NAD HEENT: EOMI,  MMM Lungs: Normal work of breathing. CTABL Heart: RRR no MRG Abd: NABS, Soft. Nondistended, Nontender Exts: Brisk capillary refill, warm and well perfused.  No edema bilateral lower extremities. Neuro: Facial expressions or more animated no tremor.   Results for orders placed or performed in visit on 04/08/17 (from the past 72 hour(s))  POCT INR     Status: None   Collection Time: 04/08/17 10:56 AM  Result Value Ref Range   INR 1.6    No results found.    Assessment and Plan: 83 y.o. male with  Impaired gait likely due to parkinsonism. Agree with Sinemet. Plan to work on home exercise program and recheck in one month.  Leg swelling significantly improved. Used torsemide as needed. Take potassium with torsemide. Recheck in one month.  Anticoagulation due to atrial fibrillation. INR is subtherapeutic now for several checks. Plan to increase the INR to 6 mg Monday Wednesday Friday and 5 mg Tuesday Thursday Saturday Sunday. Recheck INR in one month.   Knee Arthritis previously addressed Voltaren gel refilled. A workup prior prior authorization   Orders Placed This Encounter  Procedures  . POCT INR   Meds ordered this encounter  Medications  . carbidopa-levodopa (SINEMET IR) 25-100 MG tablet    Sig: Take by mouth.  . diclofenac sodium (VOLTAREN) 1 % GEL    Sig: Apply 4 g topically 4 (four) times daily. To affected joint.    Dispense:  100 g    Refill:  11  . warfarin (COUMADIN) 1 MG tablet    Sig: Take 1mg  pill in  addition to 5mg  pill on Monday, Wed, Friday    Dispense:  30 tablet    Refill:  1     Discussed warning signs or symptoms. Please see discharge instructions. Patient expresses understanding.

## 2017-04-09 ENCOUNTER — Telehealth: Payer: Self-pay | Admitting: Family Medicine

## 2017-04-09 NOTE — Telephone Encounter (Signed)
Approvedtoday  Request Reference Number: [id number]. DICLOFENAC GEL 1% is approved through 06/24/2018. For further questions, call [phone].  Patient and pharmacy notified

## 2017-04-09 NOTE — Telephone Encounter (Signed)
Patient called inquiring about an authorization for Voltaren that was apparently prescribed yesterday by Dr. Denyse Amass. I explained that getting approval will take more than one day and that we would call him as soon as we receive a response from his insurance company. Please advise patient when we receive more info. Thanks!

## 2017-04-10 NOTE — Telephone Encounter (Signed)
Pt states that he can get it for $19/tube at [facility], but with the directions of application QID - it would be 4 tubes a month. Which he cannot afford. So he would like PCP to complete form when it comes in.   He would then like a 90 day supply sent to [facility].

## 2017-04-10 NOTE — Telephone Encounter (Signed)
Pt states after the PA was approved the Rx was still too expensive. He called [employer] and they are going to file an appeal for a tier change, which would make it cheaper. They are to fax a form for PCP to complete. Waiting for form.

## 2017-04-10 NOTE — Telephone Encounter (Signed)
Regardless of insurance we can get it for about $24 a tube at [facility] using a GoodRx card.  LEt me know if he wants to do that.

## 2017-04-11 NOTE — Telephone Encounter (Signed)
Will fill out form when I get it

## 2017-04-12 ENCOUNTER — Other Ambulatory Visit: Payer: Self-pay

## 2017-04-12 MED ORDER — DICLOFENAC SODIUM 1 % TD GEL: 4.0000 g | Freq: Four times a day (QID) | TRANSDERMAL | 11 refills | Status: DC

## 2017-04-12 NOTE — Telephone Encounter (Signed)
Form competed 04/11/17

## 2017-05-07 ENCOUNTER — Ambulatory Visit (INDEPENDENT_AMBULATORY_CARE_PROVIDER_SITE_OTHER): Payer: Medicare Other | Admitting: Family Medicine

## 2017-05-07 ENCOUNTER — Encounter: Payer: Self-pay | Admitting: Family Medicine

## 2017-05-07 VITALS — BP 128/64 | Temp 97.6°F | Ht 75.0 in | Wt 213.0 lb

## 2017-05-07 DIAGNOSIS — Z7901 Long term (current) use of anticoagulants: Secondary | ICD-10-CM

## 2017-05-07 DIAGNOSIS — I4819 Other persistent atrial fibrillation: Secondary | ICD-10-CM

## 2017-05-07 DIAGNOSIS — I428 Other cardiomyopathies: Secondary | ICD-10-CM | POA: Diagnosis not present

## 2017-05-07 DIAGNOSIS — R259 Unspecified abnormal involuntary movements: Secondary | ICD-10-CM | POA: Diagnosis not present

## 2017-05-07 DIAGNOSIS — I481 Persistent atrial fibrillation: Secondary | ICD-10-CM | POA: Diagnosis not present

## 2017-05-07 DIAGNOSIS — M6281 Muscle weakness (generalized): Secondary | ICD-10-CM | POA: Diagnosis not present

## 2017-05-07 DIAGNOSIS — R2681 Unsteadiness on feet: Secondary | ICD-10-CM

## 2017-05-07 LAB — POCT INR: INR: 1.5

## 2017-05-07 MED ORDER — WARFARIN SODIUM 1 MG PO TABS: ORAL_TABLET | ORAL | 1 refills | Status: DC

## 2017-05-07 MED ORDER — CARBIDOPA-LEVODOPA 25-100 MG PO TABS: 1.0000 | ORAL_TABLET | Freq: Two times a day (BID) | ORAL | 1 refills | Status: DC

## 2017-05-07 NOTE — Progress Notes (Signed)
[name] [name] is a 83 y.o. male who presents to [facility] [name] [name]: Primary Care Sports Medicine today for leg swelling atrial fibrillation impaired mobility and heart failure.  Leg swelling: [name] [name] notes continued leg swelling that has been the same or worse since his last visit. He notes he stopped using his torsemide consistently because it's obnoxious to take. He denies significant shortness of breath or trouble breathing. He denies orthopnea.  Impaired mobility significantly improved with Sinemet.  He has yet to schedule a follow-up appointment with neurology and notes that he'll be running out of this medication soon. He is quite satisfied with how well he is doing and denies obnoxious side effects.  Atrial fibrillation: [name] [name] notes that he has run out of his 1 mg warfarin dose. He denies chest pain palpitations or shortness of breath.   Past Medical History:  Diagnosis Date  . Anemia, unspecified 04/07/2013  . Atrial fibrillation (HCC)    fibrillation/flutter  . BPH (benign prostatic hypertrophy)   . Chronic systolic CHF (congestive heart failure) (HCC)    Nonischemic DCM EF 10% s/p BiV AICD  . Coronary artery disease 2008   nonobstructive ASCAD  . Depression   . Hyperlipidemia   . Hypertension   . Hypothyroidism   . Left bundle branch block   . Nonischemic cardiomyopathy (HCC)    ef 10 %  . OSA (obstructive sleep apnea)    intolerant to CPAP  . Osteoarthritis   . Sleep apnea    stopped using CPAP  . Thrombocytopenia (HCC)    Past Surgical History:  Procedure Laterality Date  . CARDIAC DEFIBRILLATOR PLACEMENT     left chest  . CHOLECYSTECTOMY    . Colonscopy     reportedly negative per pt; around 2005.    Marland Kitchen TRANSURETHRAL RESECTION OF PROSTATE     for BPH   Social History   Tobacco Use  . Smoking status: Never Smoker  . Smokeless tobacco: Never Used  Substance Use Topics    . Alcohol use: No   family history includes Cancer in his maternal uncle; Heart disease in his father; Heart failure in his mother; Stroke in his mother.  ROS as above:  Medications: Current Outpatient Medications  Medication Sig Dispense Refill  . acetaminophen (TYLENOL) 650 MG CR tablet Take 1 tablet (650 mg total) by mouth every 8 (eight) hours as needed for pain. 90 tablet 3  . AMBULATORY NON FORMULARY MEDICATION Front wheel rolling Denker for use as needed due to gait instability. Dispense 1   Fax to [phone]  R26.81 1 each 0  . carbidopa-levodopa (SINEMET IR) 25-100 MG tablet Take 1 tablet 2 (two) times daily by mouth. 180 tablet 1  . diclofenac sodium (VOLTAREN) 1 % GEL Apply 4 g topically 4 (four) times daily. To affected joint. 100 g 11  . digoxin (DIGOX) 0.125 MG tablet Take 1 tablet (125 mcg total) by mouth every other day. 45 tablet 2  . levothyroxine (SYNTHROID, LEVOTHROID) 137 MCG tablet TAKE 1 TABLET BY MOUTH  DAILY BEFORE BREAKFAST 90 tablet 1  . potassium chloride SA (K-DUR,KLOR-CON) 20 MEQ tablet Take 1 tablet (20 mEq total) by mouth daily as needed (with each torsemide dose). 10 tablet 10  . ramipril (ALTACE) 2.5 MG capsule Take 2.5 mg by mouth daily.    . simvastatin (ZOCOR) 10 MG tablet Take 1 tablet (10 mg total) by mouth every evening. 90 tablet 2  . spironolactone (ALDACTONE) 25 MG  tablet Take 0.5 tablets (12.5 mg total) by mouth daily. 45 tablet 2  . torsemide (DEMADEX) 20 MG tablet Take 1 tablet (20 mg total) by mouth daily as needed (weight gain). Take 1 tablet for 2lb wt gain overnight or 3 lbs in a week. 30 tablet 6  . warfarin (COUMADIN) 1 MG tablet Take 1mg  pill in addition to 5mg  pill on Monday, Wed, Friday 90 tablet 1  . warfarin (COUMADIN) 5 MG tablet Take 1 tablet (5 mg total) by mouth daily at 6 PM. 30 tablet 0   No current facility-administered medications for this visit.    Allergies  Allergen Reactions  . Amiodarone Other (See Comments)     Neuro toxicity    Health Maintenance Health Maintenance  Topic Date Due  . PNA vac Low Risk Adult (1 of 2 - PCV13) 05/29/1999  . TETANUS/TDAP  07/01/2019  . INFLUENZA VACCINE  Completed     Exam:  BP 128/64   Temp 97.6 F (36.4 C) (Oral)   Ht 6\' 3"  (1.905 m)   Wt 213 lb (96.6 kg)   BMI 26.62 kg/m  Gen: Well NAD HEENT: EOMI,  MMM Lungs: Normal work of breathing. CTABL Heart: RRR no MRG Abd: NABS, Soft. Nondistended, Nontender Exts: Brisk capillary refill, warm and well perfused.  Bilateral lower extremity edema 1+ pitting bilaterally. No palpable cords.   Results for orders placed or performed in visit on 05/07/17 (from the past 72 hour(s))  POCT INR     Status: None   Collection Time: 05/07/17 11:11 AM  Result Value Ref Range   INR 1.5    No results found.    Assessment and Plan: 83 y.o. male with  Impaired mobility with Parkinson's features: Much better on Sinemet. Continue current regimen and recheck in one month. Recommend follow-up with neurology.  Heart failure and atrial fibrillation. Doing reasonably well. I think he should probably be taking torsemide more frequently than he is although right now he does not appear to be in pulmonary edema or severely volume overloaded.  Patient will follow-up with cardiology in the near future. As for anticoagulation think it's reasonable to restart normal warfarin dose and recheck INR in a month. He has not yet at goal.   Recheck in 1 month.   Orders Placed This Encounter  Procedures  . POCT INR   Meds ordered this encounter  Medications  . carbidopa-levodopa (SINEMET IR) 25-100 MG tablet    Sig: Take 1 tablet 2 (two) times daily by mouth.    Dispense:  180 tablet    Refill:  1  . warfarin (COUMADIN) 1 MG tablet    Sig: Take 1mg  pill in addition to 5mg  pill on Monday, Wed, Friday    Dispense:  90 tablet    Refill:  1     Discussed warning signs or symptoms. Please see discharge instructions. Patient expresses  understanding.

## 2017-05-07 NOTE — Patient Instructions (Addendum)
Thank you for coming in today. Try to take the torsemide more frequently.  Continue Warfarin.,  Recheck in 1 month.

## 2017-05-10 ENCOUNTER — Encounter (HOSPITAL_COMMUNITY): Payer: Self-pay | Admitting: Cardiology

## 2017-05-10 ENCOUNTER — Ambulatory Visit (HOSPITAL_COMMUNITY)
Admission: RE | Admit: 2017-05-10 | Discharge: 2017-05-10 | Disposition: A | Discharge status: Home / Self Care | Payer: Medicare Other | Source: Ambulatory Visit | Attending: Cardiology | Admitting: Cardiology

## 2017-05-10 VITALS — BP 126/72 | HR 59 | Wt 209.0 lb

## 2017-05-10 DIAGNOSIS — I110 Hypertensive heart disease with heart failure: Secondary | ICD-10-CM | POA: Diagnosis not present

## 2017-05-10 DIAGNOSIS — Z950 Presence of cardiac pacemaker: Secondary | ICD-10-CM | POA: Diagnosis not present

## 2017-05-10 DIAGNOSIS — E785 Hyperlipidemia, unspecified: Secondary | ICD-10-CM | POA: Diagnosis not present

## 2017-05-10 DIAGNOSIS — E039 Hypothyroidism, unspecified: Secondary | ICD-10-CM | POA: Diagnosis not present

## 2017-05-10 DIAGNOSIS — I5022 Chronic systolic (congestive) heart failure: Secondary | ICD-10-CM | POA: Insufficient documentation

## 2017-05-10 DIAGNOSIS — F329 Major depressive disorder, single episode, unspecified: Secondary | ICD-10-CM | POA: Diagnosis not present

## 2017-05-10 DIAGNOSIS — Z79899 Other long term (current) drug therapy: Secondary | ICD-10-CM | POA: Insufficient documentation

## 2017-05-10 DIAGNOSIS — M1990 Unspecified osteoarthritis, unspecified site: Secondary | ICD-10-CM | POA: Insufficient documentation

## 2017-05-10 DIAGNOSIS — Z95810 Presence of automatic (implantable) cardiac defibrillator: Secondary | ICD-10-CM | POA: Diagnosis not present

## 2017-05-10 DIAGNOSIS — I447 Left bundle-branch block, unspecified: Secondary | ICD-10-CM | POA: Diagnosis not present

## 2017-05-10 DIAGNOSIS — Z7901 Long term (current) use of anticoagulants: Secondary | ICD-10-CM | POA: Insufficient documentation

## 2017-05-10 DIAGNOSIS — R000 Tachycardia, unspecified: Secondary | ICD-10-CM | POA: Diagnosis not present

## 2017-05-10 DIAGNOSIS — N400 Enlarged prostate without lower urinary tract symptoms: Secondary | ICD-10-CM | POA: Insufficient documentation

## 2017-05-10 DIAGNOSIS — I480 Paroxysmal atrial fibrillation: Secondary | ICD-10-CM | POA: Diagnosis not present

## 2017-05-10 DIAGNOSIS — G4733 Obstructive sleep apnea (adult) (pediatric): Secondary | ICD-10-CM | POA: Diagnosis not present

## 2017-05-10 DIAGNOSIS — I2510 Atherosclerotic heart disease of native coronary artery without angina pectoris: Secondary | ICD-10-CM | POA: Diagnosis not present

## 2017-05-10 DIAGNOSIS — I429 Cardiomyopathy, unspecified: Secondary | ICD-10-CM | POA: Diagnosis not present

## 2017-05-10 LAB — BASIC METABOLIC PANEL
ANION GAP: 7 (ref 5–15)
BUN: 27 mg/dL — ABNORMAL HIGH (ref 6–20)
CALCIUM: 8.9 mg/dL (ref 8.9–10.3)
CO2: 29 mmol/L (ref 22–32)
CREATININE: 1.38 mg/dL — AB (ref 0.61–1.24)
Chloride: 102 mmol/L (ref 101–111)
GFR, EST AFRICAN AMERICAN: 53 mL/min — AB (ref >60)
GFR, EST NON AFRICAN AMERICAN: 46 mL/min — AB (ref >60)
Glucose, Bld: 158 mg/dL — ABNORMAL HIGH (ref 65–99)
Potassium: 3.9 mmol/L (ref 3.5–5.1)
Sodium: 138 mmol/L (ref 135–145)

## 2017-05-10 LAB — DIGOXIN LEVEL: DIGOXIN LVL: 0.3 ng/mL — AB (ref 0.8–2.0)

## 2017-05-10 MED ORDER — TORSEMIDE 20 MG PO TABS: 20.0000 mg | ORAL_TABLET | Freq: Every day | ORAL | 6 refills | Status: DC

## 2017-05-10 MED ORDER — POTASSIUM CHLORIDE CRYS ER 20 MEQ PO TBCR: 20.0000 meq | EXTENDED_RELEASE_TABLET | Freq: Every day | ORAL | 10 refills | Status: DC

## 2017-05-10 NOTE — Patient Instructions (Signed)
Take Torsemide 20 mg (1 tab) daily  Take Potassium 20 meq (1 tab) daily  Labs drawn today (if we do not call you, then your lab work was stable)   Your physician recommends that you return for lab work in: 10 days Rx given  Your physician recommends that you schedule a follow-up appointment in: 6 weeks with Dr. Shirlee Latch

## 2017-05-12 NOTE — Progress Notes (Signed)
Patient ID: [name] EdisVernon E Froman, male   DOB: 09/08/1931, 83 y.o.   MRN: [id number]     Advanced Heart Failure Clinic Note   Primary Cardiologist: Dr Mayford Knifeurner PCP: Dr Denyse Amassorey HF Cardiology: Shirlee LatchMcLean HPI: [name] [name] is a 83 y.o. male with PMH of A tach s/p DC-CV 12/29/12, CAD, PAF on chronic coumadin and amiodarone, OSA- CPAP, NICM cath 2008, chronic systolic heart failure EF 25-30% (01/2013) , S/P Medtronic CRT-D 2008 and generator changed 2012, LBBB, CRI (baseline 1.7-1.8) and Hypothyroidism.   He is S/P DC-CV 12/29/12 and initially he felt good for about a week. He had a functional decline and was only able to walk a few steps. [facility] with increased dyspnea on exertion and low extremity edema. He was started on a lasix gtt 10 mg hr. Admit weight 233 lbs. Discharge weight: 214 lbs.    He has been off Coreg due to fatigue.  He is off amiodarone due to imbalance/gait instability.   Patient presents for followup of CHF.  He was started on Sinemet by neurology, but was told that he does NOT have Parkinsons but has some of the features.  Since starting Sinemet, his movement has been better.  Still very limited. Weight stable.  No BRBPR/melena.  No palpitations.  He is walking with his [place], no dyspnea walking around the house. He has done home PT, no falls.   Medtronic device interrogation: fluid index > threshold with decreased impedance, frequent atrial fibrillation runs noted.    Echo 10/31/2016 LVEF 35-40%, Mod AI, Mod MR, Severe LAE, Moderate RVH, Severe RAE, PA peak pressure 19 mm Hg  ECG (personally reviewed): BiV pacing, suspect underlying atrial fibrillation.   Labs 8/21: Na 132 K 4.5 Cr 1.7 (stable)  Labs 03/02/13 NA 135 Potassium 4.6 Creatinine 1.6 Dig 1.6 TSH 3.95        04/02/13: digoxin 0.8, K+ 4.5, creatinine 1.48         09/08/13 K 4.4 Creatine 1.7          6/18: K 4, creatinine 1.32, TSH normal, hgb 12.9         9/18: TSH normal, K 4.4, creatinine 1.37, LFTs normal   Review of systems complete and found to be negative unless listed in HPI.    Past Medical History:  Diagnosis Date  . Anemia, unspecified 04/07/2013  . Atrial fibrillation (HCC)    fibrillation/flutter  . BPH (benign prostatic hypertrophy)   . Chronic systolic CHF (congestive heart failure) (HCC)    Nonischemic DCM EF 10% s/p BiV AICD  . Coronary artery disease 2008   nonobstructive ASCAD  . Depression   . Hyperlipidemia   . Hypertension   . Hypothyroidism   . Left bundle branch block   . Nonischemic cardiomyopathy (HCC)    ef 10 %  . OSA (obstructive sleep apnea)    intolerant to CPAP  . Osteoarthritis   . Sleep apnea    stopped using CPAP  . Thrombocytopenia (HCC)     Current Outpatient Medications  Medication Sig Dispense Refill  . acetaminophen (TYLENOL) 650 MG CR tablet Take 1 tablet (650 mg total) by mouth every 8 (eight) hours as needed for pain. 90 tablet 3  . AMBULATORY NON FORMULARY MEDICATION Front wheel rolling Abrams for use as needed due to gait instability. Dispense 1   Fax to [phone]  R26.81 1 each 0  . carbidopa-levodopa (SINEMET IR) 25-100 MG tablet Take 1 tablet 2 (two) times daily  by mouth. 180 tablet 1  . diclofenac sodium (VOLTAREN) 1 % GEL Apply 4 g topically 4 (four) times daily. To affected joint. 100 g 11  . digoxin (DIGOX) 0.125 MG tablet Take 1 tablet (125 mcg total) by mouth every other day. 45 tablet 2  . levothyroxine (SYNTHROID, LEVOTHROID) 137 MCG tablet TAKE 1 TABLET BY MOUTH  DAILY BEFORE BREAKFAST 90 tablet 1  . potassium chloride SA (K-DUR,KLOR-CON) 20 MEQ tablet Take 1 tablet (20 mEq total) daily by mouth. 10 tablet 10  . ramipril (ALTACE) 2.5 MG capsule Take 2.5 mg by mouth daily.    . simvastatin (ZOCOR) 10 MG tablet Take 1 tablet (10 mg total) by mouth every evening. 90 tablet 2  . spironolactone (ALDACTONE) 25 MG tablet Take 0.5 tablets (12.5 mg total) by mouth daily. 45 tablet 2  . torsemide (DEMADEX) 20 MG tablet Take 1  tablet (20 mg total) daily by mouth. 30 tablet 6  . warfarin (COUMADIN) 1 MG tablet Take 1mg  pill in addition to 5mg  pill on Monday, Wed, Friday 90 tablet 1  . warfarin (COUMADIN) 5 MG tablet Take 1 tablet (5 mg total) by mouth daily at 6 PM. 30 tablet 0   No current facility-administered medications for this encounter.     Vitals:   05/10/17 1455  BP: 126/72  Pulse: (!) 59  SpO2: 95%  Weight: 209 lb (94.8 kg)   Wt Readings from Last 3 Encounters:  05/10/17 209 lb (94.8 kg)  05/07/17 213 lb (96.6 kg)  04/08/17 204 lb (92.5 kg)    PHYSICAL EXAM: General: NAD, frail Neck: JVP 8-9 cm with HJR, no thyromegaly or thyroid nodule.  Lungs: Clear to auscultation bilaterally with normal respiratory effort. CV: Nondisplaced PMI.  Heart regular S1/S2, no S3/S4, no murmur.  1+ edema 1/2 to knees bilaterallys.  No carotid bruit.  Normal pedal pulses.  Abdomen: Soft, nontender, no hepatosplenomegaly, no distention.  Skin: Intact without lesions or rashes.  Neurologic: Alert and oriented x 3.  Psych: Normal affect. Extremities: No clubbing or cyanosis.  HEENT: Normal.   ASSESSMENT & PLAN:  1) Chronic systolic HF: NICM 01/2013 EF 20%; Medtronic CRT-D.  Most recent echo 10/2016 LVEF 35-40%, severe LAE, severe RAE, PA peak pressure 19 mm Hg.  NYHA class III symptoms, somewhat improved. However, he is volume overloaded on exam and by Optivol. - He needs to take torsemide 20 mg daily rather than prn.  He will also take KCL 20 daily.  - BMET today and in 10 days.  - Continue spironolactone and ramipril at current doses.   - Off Coreg with fatigue and soft pressures.  - Continue digoxin, check level.  2) OSA: Continue nightly CPAP 3) Atrial tachy/AF: Paroxysmal. Frequent runs AF by device interrogation.  He is in NSR today.  He does not feel palpitations. He was taken off amiodarone due to gait instability/imbalance, I will not restart.  - Continue warfarin.  4) Deconditioning: Keep working to stay  active.   Followup in 6 wks  Marca Ancona, MD  05/12/2017

## 2017-05-13 ENCOUNTER — Telehealth: Payer: Self-pay

## 2017-05-13 NOTE — Telephone Encounter (Signed)
Yes and yes 

## 2017-05-13 NOTE — Telephone Encounter (Signed)
Patient would like a refill on the potassium, however it has been filled by Marca Ancona, MD for 20 mEq # 10 with 10 refills. Should the patient take the potassium daily? Also is it ok to refill? Please advise.

## 2017-05-14 MED ORDER — POTASSIUM CHLORIDE CRYS ER 20 MEQ PO TBCR: 20.0000 meq | EXTENDED_RELEASE_TABLET | Freq: Every day | ORAL | 3 refills | Status: DC

## 2017-05-14 NOTE — Telephone Encounter (Signed)
Rx sent 

## 2017-05-14 NOTE — Telephone Encounter (Signed)
Pt's wife advised of refill (Pt was unable to come to the phone), no further questions.

## 2017-05-20 DIAGNOSIS — I5022 Chronic systolic (congestive) heart failure: Secondary | ICD-10-CM | POA: Diagnosis not present

## 2017-05-21 ENCOUNTER — Ambulatory Visit (INDEPENDENT_AMBULATORY_CARE_PROVIDER_SITE_OTHER): Payer: Medicare Other | Admitting: *Deleted

## 2017-05-21 DIAGNOSIS — I428 Other cardiomyopathies: Secondary | ICD-10-CM

## 2017-05-22 NOTE — Progress Notes (Signed)
Remote ICD transmission.   

## 2017-05-23 LAB — CUP PACEART REMOTE DEVICE CHECK
Battery Remaining Longevity: 78 mo
Brady Statistic AP VS Percent: 0.08 %
Brady Statistic RA Percent Paced: 27.96 %
Date Time Interrogation Session: 20181127083422
HIGH POWER IMPEDANCE MEASURED VALUE: 42 Ohm
HIGH POWER IMPEDANCE MEASURED VALUE: 52 Ohm
Implantable Lead Implant Date: 20080806
Implantable Lead Location: 753860
Implantable Lead Model: 4076
Implantable Pulse Generator Implant Date: 20171115
Lead Channel Impedance Value: 342 Ohm
Lead Channel Pacing Threshold Amplitude: 0.625 V
Lead Channel Sensing Intrinsic Amplitude: 6 mV
Lead Channel Sensing Intrinsic Amplitude: 6 mV
Lead Channel Setting Pacing Amplitude: 2 V
Lead Channel Setting Pacing Amplitude: 2 V
Lead Channel Setting Sensing Sensitivity: 0.3 mV
MDC IDC LEAD IMPLANT DT: 20080806
MDC IDC LEAD IMPLANT DT: 20080806
MDC IDC LEAD LOCATION: 753858
MDC IDC LEAD LOCATION: 753859
MDC IDC MSMT BATTERY VOLTAGE: 3 V
MDC IDC MSMT LEADCHNL LV IMPEDANCE VALUE: 494 Ohm
MDC IDC MSMT LEADCHNL LV IMPEDANCE VALUE: 570 Ohm
MDC IDC MSMT LEADCHNL LV IMPEDANCE VALUE: 950 Ohm
MDC IDC MSMT LEADCHNL RA IMPEDANCE VALUE: 399 Ohm
MDC IDC MSMT LEADCHNL RA SENSING INTR AMPL: 0.5 mV
MDC IDC MSMT LEADCHNL RA SENSING INTR AMPL: 0.5 mV
MDC IDC MSMT LEADCHNL RV IMPEDANCE VALUE: 399 Ohm
MDC IDC MSMT LEADCHNL RV PACING THRESHOLD PULSEWIDTH: 0.4 ms
MDC IDC SET LEADCHNL LV PACING AMPLITUDE: 2.5 V
MDC IDC SET LEADCHNL LV PACING PULSEWIDTH: 0.4 ms
MDC IDC SET LEADCHNL RV PACING PULSEWIDTH: 0.4 ms
MDC IDC STAT BRADY AP VP PERCENT: 32.89 %
MDC IDC STAT BRADY AS VP PERCENT: 61.01 %
MDC IDC STAT BRADY AS VS PERCENT: 6.02 %
MDC IDC STAT BRADY RV PERCENT PACED: 91.82 %

## 2017-05-24 ENCOUNTER — Encounter: Payer: Self-pay | Admitting: Cardiology

## 2017-05-28 ENCOUNTER — Other Ambulatory Visit: Payer: Self-pay

## 2017-06-04 ENCOUNTER — Other Ambulatory Visit (HOSPITAL_COMMUNITY): Payer: Self-pay | Admitting: Cardiology

## 2017-06-05 ENCOUNTER — Ambulatory Visit: Payer: Medicare Other | Admitting: Cardiology

## 2017-06-05 ENCOUNTER — Ambulatory Visit: Payer: Medicare Other | Admitting: Family Medicine

## 2017-06-11 ENCOUNTER — Encounter: Payer: Self-pay | Admitting: Family Medicine

## 2017-06-11 ENCOUNTER — Ambulatory Visit (INDEPENDENT_AMBULATORY_CARE_PROVIDER_SITE_OTHER): Payer: Medicare Other | Admitting: Family Medicine

## 2017-06-11 VITALS — BP 113/63 | HR 79 | Wt 205.0 lb

## 2017-06-11 DIAGNOSIS — Z7901 Long term (current) use of anticoagulants: Secondary | ICD-10-CM

## 2017-06-11 DIAGNOSIS — I5023 Acute on chronic systolic (congestive) heart failure: Secondary | ICD-10-CM

## 2017-06-11 DIAGNOSIS — R259 Unspecified abnormal involuntary movements: Secondary | ICD-10-CM

## 2017-06-11 DIAGNOSIS — I428 Other cardiomyopathies: Secondary | ICD-10-CM

## 2017-06-11 DIAGNOSIS — I481 Persistent atrial fibrillation: Secondary | ICD-10-CM | POA: Diagnosis not present

## 2017-06-11 DIAGNOSIS — I4819 Other persistent atrial fibrillation: Secondary | ICD-10-CM

## 2017-06-11 LAB — POCT INR: INR: 2.2

## 2017-06-11 NOTE — Progress Notes (Signed)
[name] [name] is a 83 y.o. male who presents to [name]: Primary Care Sports Medicine today for atrial fibrillation, heart failure, Parkinson's features.   Venard has done well in the last month.  He is currently managed via several different cardiologists.  He most recently has been seen by Dr. Shirlee Latch in heart failure clinic.  He is well optimized and feeling pretty well.  He notes he does not have much leg swelling and takes his torsemide daily.  He is able to get around the house better without significant dyspnea now.  Atrial fibrillation and anticoagulation.  Cahill continues to take warfarin daily.  He notes his INR has been a bit low recently.  He feels well denies any bleeding.  He denies any falls recently.  The Parkinson's features: Curly had significant difficulty walking and gait instability over the last several months.  He subsequently was seen by neurology who suspected parkinsonian .  He currently takes Sinemet and does very well.  He notes he is able to get around a lot better.  He has restarted driving and feels safe.   Past Medical History:  Diagnosis Date  . Anemia, unspecified 04/07/2013  . Atrial fibrillation (HCC)    fibrillation/flutter  . BPH (benign prostatic hypertrophy)   . Chronic systolic CHF (congestive heart failure) (HCC)    Nonischemic DCM EF 10% s/p BiV AICD  . Coronary artery disease 2008   nonobstructive ASCAD  . Depression   . Hyperlipidemia   . Hypertension   . Hypothyroidism   . Left bundle branch block   . Nonischemic cardiomyopathy (HCC)    ef 10 %  . OSA (obstructive sleep apnea)    intolerant to CPAP  . Osteoarthritis   . Sleep apnea    stopped using CPAP  . Thrombocytopenia (HCC)    Past Surgical History:  Procedure Laterality Date  . CARDIAC DEFIBRILLATOR PLACEMENT     left chest  . CARDIOVERSION N/A 12/29/2012   Procedure:  CARDIOVERSION;  Surgeon: Quintella Reichert, MD;  Location: [facility] ENDOSCOPY;  Service: Cardiovascular;  Laterality: N/A;  . CHOLECYSTECTOMY    . Colonscopy     reportedly negative per pt; around 2005.    . EP IMPLANTABLE DEVICE N/A 05/09/2016   Procedure: BIVI ICD Generator Changeout;  Surgeon: Duke Salvia, MD;  Location: [facility] INVASIVE CV LAB;  Service: Cardiovascular;  Laterality: N/A;  . TRANSURETHRAL RESECTION OF PROSTATE     for BPH   Social History   Tobacco Use  . Smoking status: Never Smoker  . Smokeless tobacco: Never Used  Substance Use Topics  . Alcohol use: No   family history includes Cancer in his maternal uncle; Heart disease in his father; Heart failure in his mother; Stroke in his mother.  ROS as above:  Medications: Current Outpatient Medications  Medication Sig Dispense Refill  . acetaminophen (TYLENOL) 650 MG CR tablet Take 1 tablet (650 mg total) by mouth every 8 (eight) hours as needed for pain. 90 tablet 3  . AMBULATORY NON FORMULARY MEDICATION Front wheel rolling Feher for use as needed due to gait instability. Dispense 1   Fax to [phone]  R26.81 1 each 0  . carbidopa-levodopa (SINEMET IR) 25-100 MG tablet Take 1 tablet 2 (two) times daily by mouth. 180 tablet 1  . diclofenac sodium (VOLTAREN) 1 % GEL Apply 4 g topically 4 (four) times daily. To affected joint. 100 g 11  . digoxin (DIGOX) 0.125  MG tablet Take 1 tablet (125 mcg total) by mouth every other day. 45 tablet 2  . levothyroxine (SYNTHROID, LEVOTHROID) 137 MCG tablet TAKE 1 TABLET BY MOUTH  DAILY BEFORE BREAKFAST 90 tablet 1  . potassium chloride SA (K-DUR,KLOR-CON) 20 MEQ tablet Take 1 tablet (20 mEq total) by mouth daily. 30 tablet 3  . ramipril (ALTACE) 2.5 MG capsule Take 2.5 mg by mouth daily.    . simvastatin (ZOCOR) 10 MG tablet Take 1 tablet (10 mg total) by mouth every evening. 90 tablet 2  . spironolactone (ALDACTONE) 25 MG tablet Take 0.5 tablets (12.5 mg total) by mouth daily. 45 tablet  2  . torsemide (DEMADEX) 20 MG tablet Take 1 tablet (20 mg total) daily by mouth. 30 tablet 6  . warfarin (COUMADIN) 1 MG tablet Take 1mg  pill in addition to 5mg  pill on Monday, Wed, Friday 90 tablet 1  . warfarin (COUMADIN) 5 MG tablet Take 1 tablet (5 mg total) by mouth daily at 6 PM. 30 tablet 0   No current facility-administered medications for this visit.    Allergies  Allergen Reactions  . Amiodarone Other (See Comments)    Neuro toxicity    Health Maintenance Health Maintenance  Topic Date Due  . PNA vac Low Risk Adult (1 of 2 - PCV13) 05/29/1999  . TETANUS/TDAP  07/01/2019  . INFLUENZA VACCINE  Completed     Exam:  BP 113/63   Pulse 79   Wt 205 lb (93 kg)   BMI 25.62 kg/m   Wt Readings from Last 5 Encounters:  06/11/17 205 lb (93 kg)  05/10/17 209 lb (94.8 kg)  05/07/17 213 lb (96.6 kg)  04/08/17 204 lb (92.5 kg)  03/04/17 207 lb 1.9 oz (93.9 kg)    Gen: Well NAD HEENT: EOMI,  MMM Lungs: Normal work of breathing. CTABL Heart: Normal rate Abd: NABS, Soft. Nondistended, Nontender Exts: Brisk capillary refill, warm and well perfused.  Trace edema BL LE   Results for orders placed or performed in visit on 06/11/17 (from the past 72 hour(s))  POCT INR     Status: None   Collection Time: 06/11/17  3:37 PM  Result Value Ref Range   INR 2.2    No results found.    Assessment and Plan: 83 y.o. male with  Heart Failure: Quite well.  Plan for continued regimen and recheck in 1-2.  Atrial fibrillation: Controlled today.  INR goal.  Continue current regimen and recheck in 1-2 months.  Parkinson's features: Doing much better with gait.  Continue home exercise program recheck in 1-2 months.   Orders Placed This Encounter  Procedures  . POCT INR   No orders of the defined types were placed in this encounter.    Discussed warning signs or symptoms. Please see discharge instructions. Patient expresses understanding.

## 2017-06-11 NOTE — Patient Instructions (Signed)
Thank you for coming in today. Continue current medicines.  Recheck in 1 month.  Return sooner if needed.

## 2017-06-13 ENCOUNTER — Other Ambulatory Visit: Payer: Self-pay

## 2017-06-13 NOTE — Telephone Encounter (Signed)
Patient request refill for Ramipril 2.5 mg. Historical medication please advise if ok to refill and send to [facility]. Rhonda Cunningham,CMA

## 2017-06-14 ENCOUNTER — Ambulatory Visit: Payer: Medicare Other | Admitting: Family Medicine

## 2017-06-14 MED ORDER — RAMIPRIL 2.5 MG PO CAPS: 2.5000 mg | ORAL_CAPSULE | Freq: Every day | ORAL | 3 refills | Status: DC

## 2017-06-17 IMAGING — CT CT L SPINE W/O CM
3 series · 9 of 33 positions shown, 10 images · non-contrast
Comparison: Lumbar spine radiographs, 03/19/2016. Abdomen and
pelvis CT, 04/08/2016.

CLINICAL DATA: Unsteadiness of gait x several weeks, resulting in
falls, Pt does use walker. Hx: osteoarthritis.

EXAM:
CT LUMBAR SPINE WITHOUT CONTRAST
TECHNIQUE: Multidetector CT imaging of the lumbar spine was performed without
intravenous contrast administration. Multiplanar CT image
reconstructions were also generated.

[Series 4: l spine soft · axial · 0.39mm/px · z∈[+1194,+1194]mm · 1 of 179 slices shown, 2 images]
[im 96/179  soft-tissue]
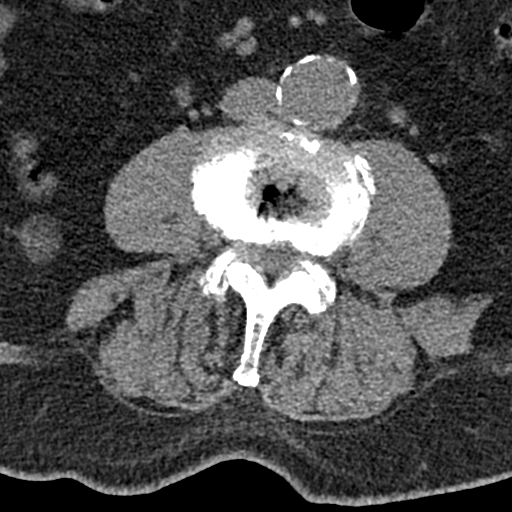
[im 96/179  bone]
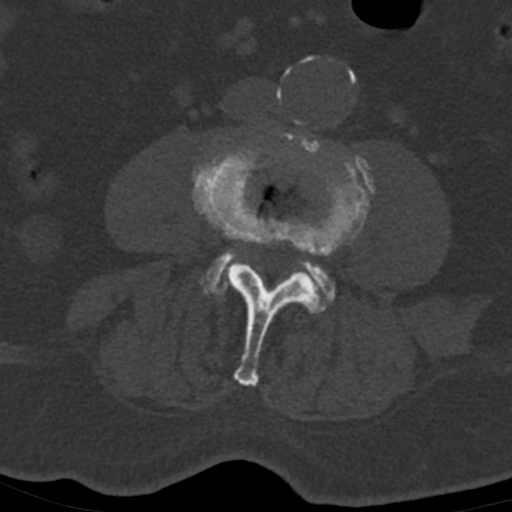

[Series 7: sagittal bone · sagittal · 0.36mm/px · 5 of 91 slices shown]
[im 31/91  bone]
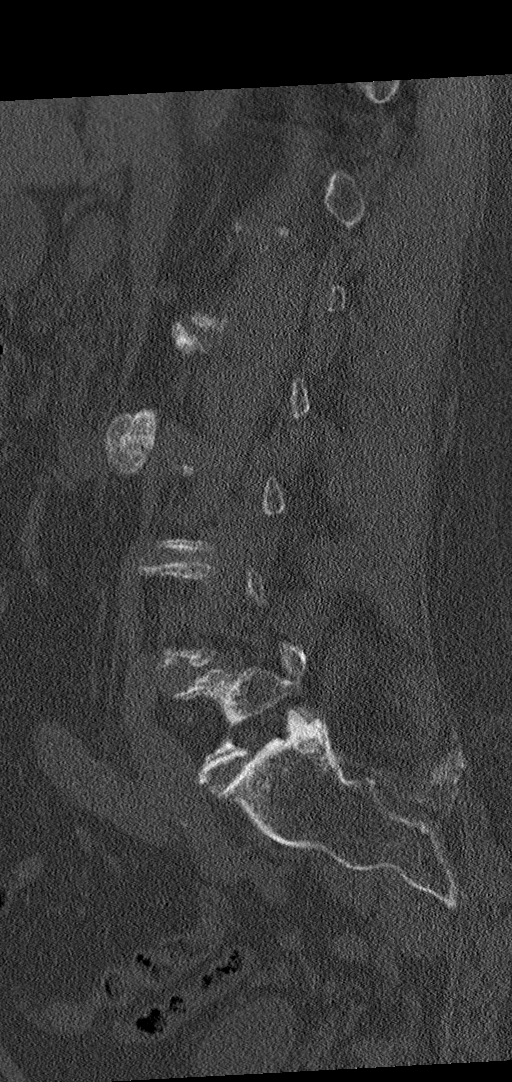
[im 38/91  bone]
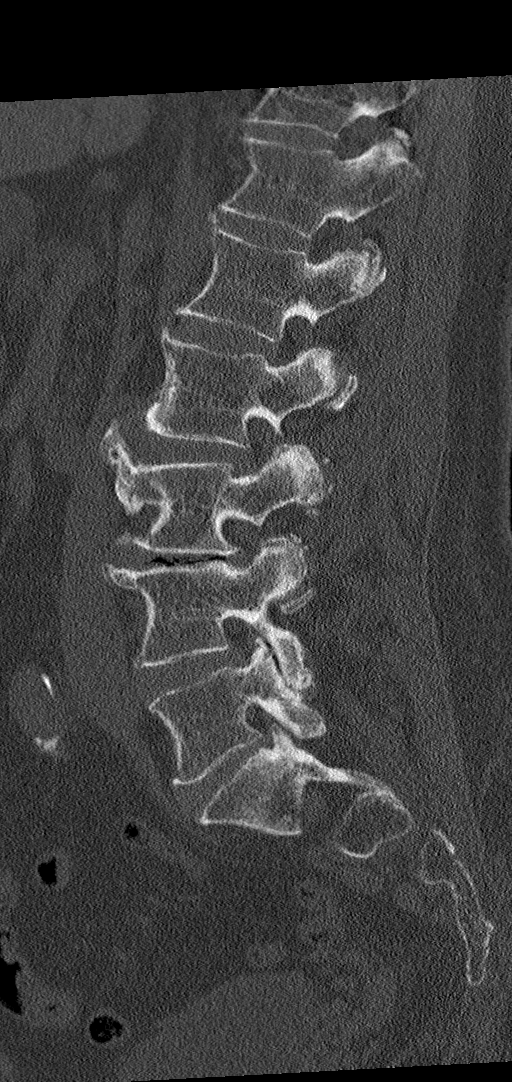
[im 46/91  bone]
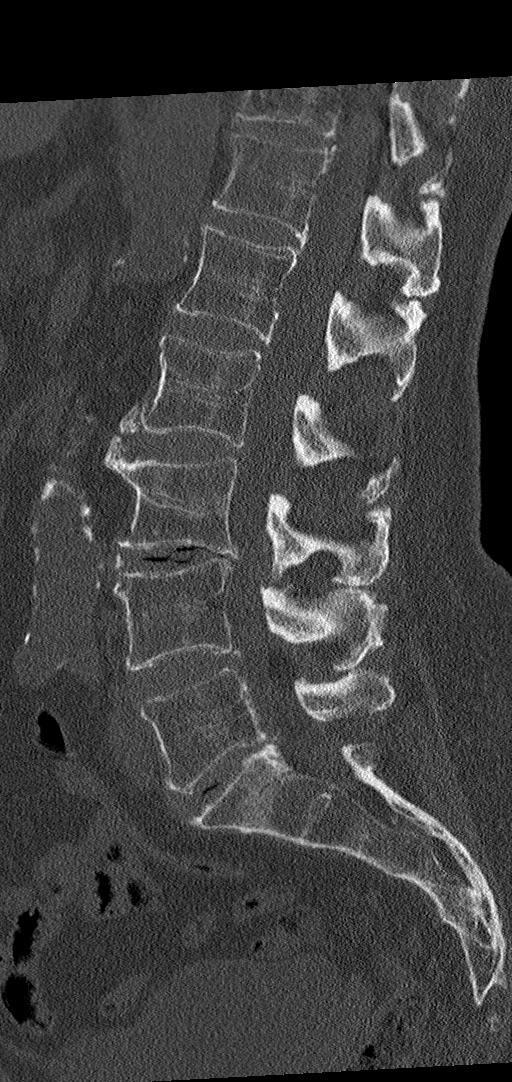
[im 53/91  bone]
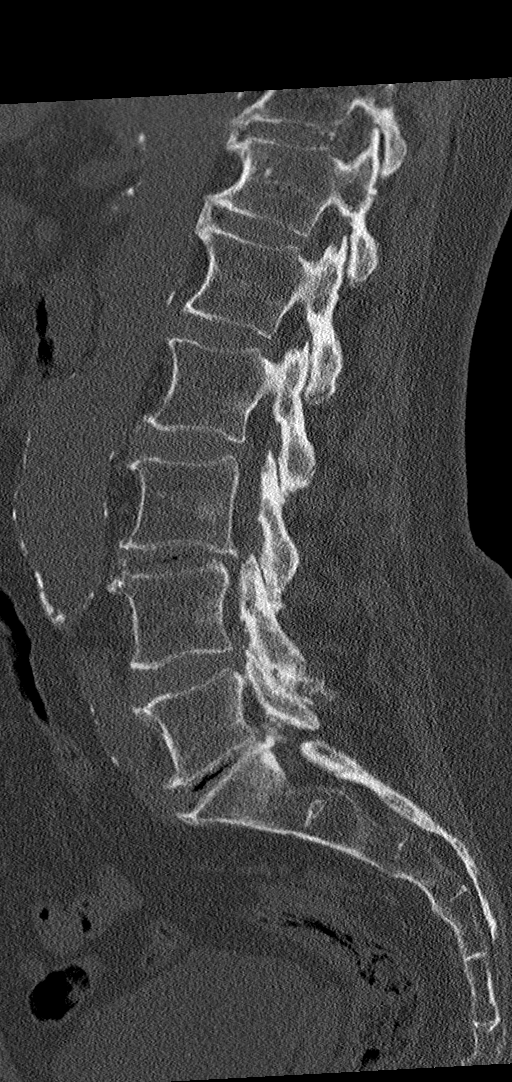
[im 61/91  bone]
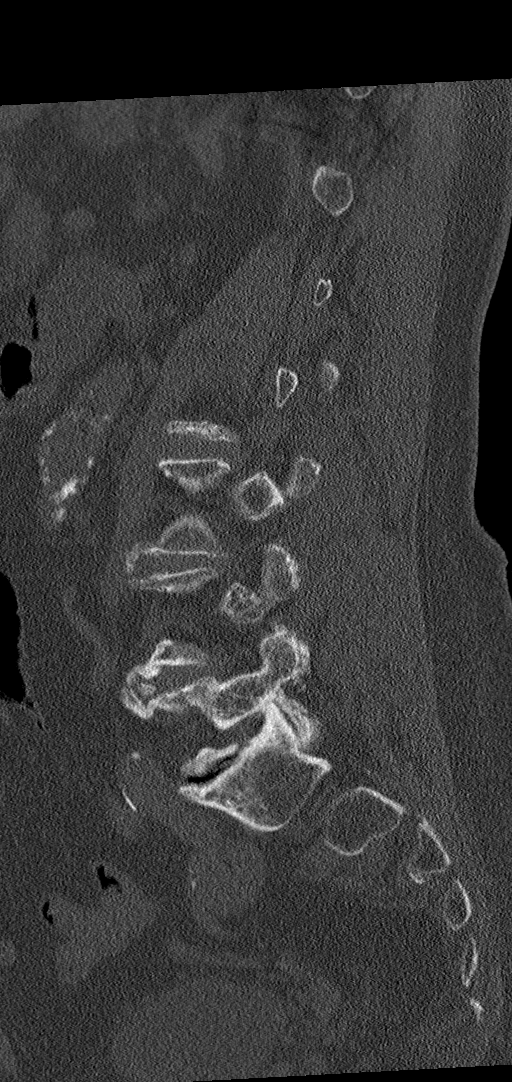

[Series 8: coronal bone · coronal · 0.35mm/px · 3 of 84 slices shown]
[im 17/84  bone]
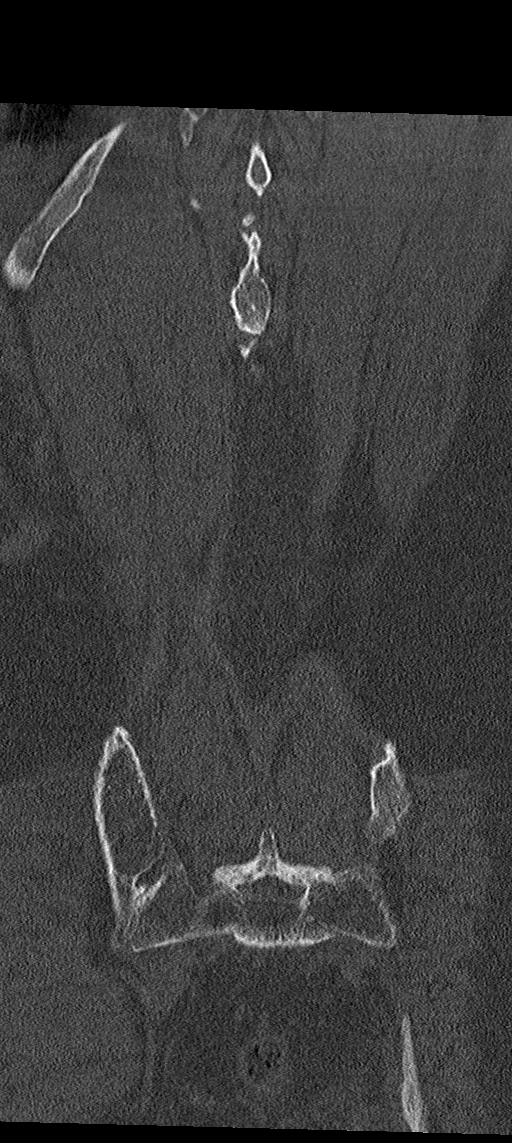
[im 34/84  bone]
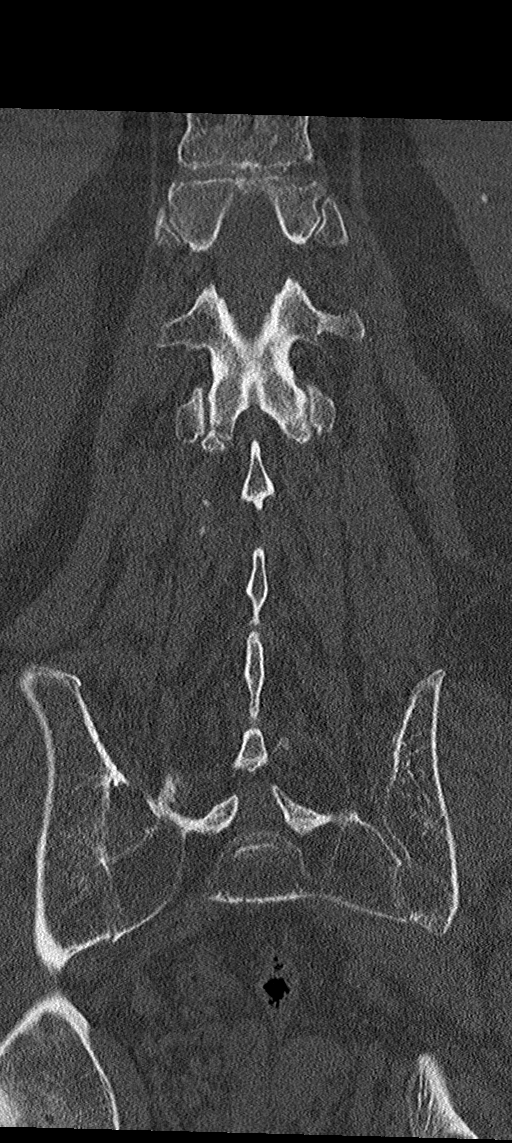
[im 50/84  bone]
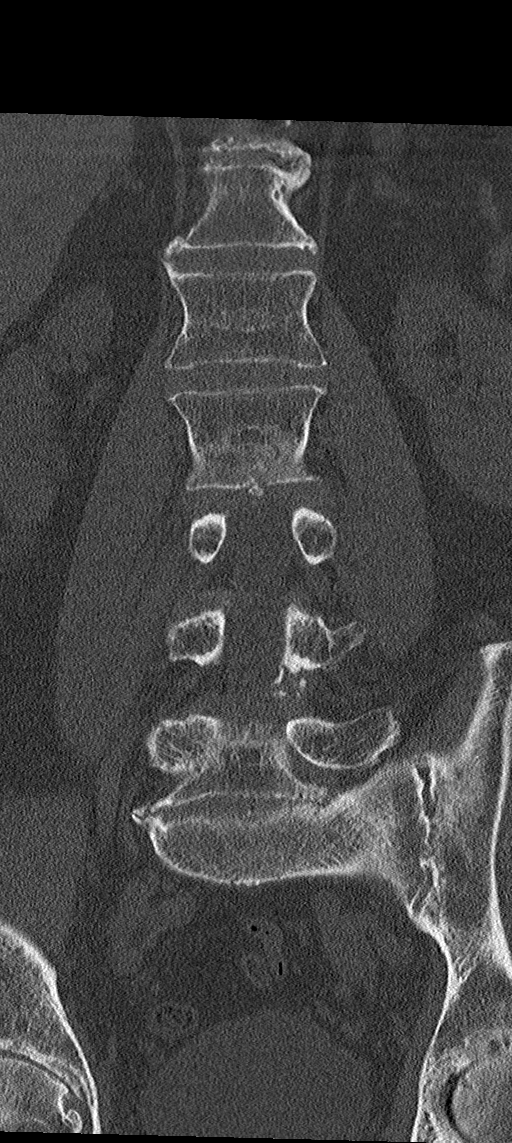

[9 of 33 positions shown; findings below may reference images not displayed]

FINDINGS: Segmentation: 5 lumbar type vertebrae.

Alignment: Normal.

Vertebrae: No fracture or bone lesion. Bones are diffusely
demineralized.

Paraspinal and other soft tissues: Abdominal aortic ectasia and
atherosclerosis. Maximum AP diameter is 3.3 cm, 2.7 cm on the prior
CT scan from 3004. There is a small left adrenal mass measuring 13
mm, stable. Status postcholecystectomy.

Disc levels:

T12-L1:  Unremarkable.

L1-L2: Mild facet degenerative change. No significant disc bulging.
No disc herniation. No stenosis.

L2-L3: Mild spondylotic disc bulging and mild facet degenerative
change. No disc herniation. No significant stenosis.

L3-L4: Mild diffuse spondylotic disc bulging. Mild facet
degenerative change. Mild narrowing of the central spinal canal,
maximum anterior posterior dimension of 1 cm. Mild lateral recess
narrowing. Mild neural foraminal narrowing.

L4-L5: Mild diffuse spondylotic disc bulging. Moderate, left greater
than right, facet degenerative change. Moderate central spinal
stenosis of the central spinal canal measuring 7 mm in greatest
anterior to posterior dimension. There is moderate narrowing of the
superior lateral recesses and mild right and moderate left neural
foraminal narrowing.

L5-S1: Diffuse spondylotic disc bulging with moderate left and
mild-to-moderate right facet degenerative change. No significant
central stenosis or lateral recess narrowing. Mild neural foraminal
narrowing on the left.
IMPRESSION: 1. No fracture or acute finding.
2. Degenerative changes as described with tricompartmental stenosis,
greatest at L4-L5 where there is moderate central stenosis, moderate
superior lateral recess narrowing and moderate left neural foraminal
narrowing.

## 2017-07-07 ENCOUNTER — Other Ambulatory Visit: Payer: Self-pay | Admitting: Family Medicine

## 2017-07-07 DIAGNOSIS — E039 Hypothyroidism, unspecified: Secondary | ICD-10-CM

## 2017-07-08 ENCOUNTER — Ambulatory Visit: Payer: Medicare Other | Admitting: Family Medicine

## 2017-07-09 ENCOUNTER — Other Ambulatory Visit (HOSPITAL_COMMUNITY): Payer: Self-pay | Admitting: *Deleted

## 2017-07-09 ENCOUNTER — Other Ambulatory Visit: Payer: Self-pay

## 2017-07-09 ENCOUNTER — Ambulatory Visit (HOSPITAL_COMMUNITY)
Admission: RE | Admit: 2017-07-09 | Discharge: 2017-07-09 | Disposition: A | Discharge status: Home / Self Care | Payer: Medicare Other | Source: Ambulatory Visit | Attending: Cardiology | Admitting: Cardiology

## 2017-07-09 ENCOUNTER — Encounter: Payer: Self-pay | Admitting: Family Medicine

## 2017-07-09 ENCOUNTER — Ambulatory Visit (INDEPENDENT_AMBULATORY_CARE_PROVIDER_SITE_OTHER): Payer: Medicare Other | Admitting: Family Medicine

## 2017-07-09 ENCOUNTER — Encounter (HOSPITAL_COMMUNITY): Payer: Self-pay | Admitting: Cardiology

## 2017-07-09 VITALS — BP 113/71 | HR 90 | Ht 75.0 in | Wt 207.0 lb

## 2017-07-09 VITALS — BP 118/66 | HR 82 | Wt 207.5 lb

## 2017-07-09 DIAGNOSIS — R259 Unspecified abnormal involuntary movements: Secondary | ICD-10-CM

## 2017-07-09 DIAGNOSIS — I481 Persistent atrial fibrillation: Secondary | ICD-10-CM | POA: Insufficient documentation

## 2017-07-09 DIAGNOSIS — I5022 Chronic systolic (congestive) heart failure: Secondary | ICD-10-CM

## 2017-07-09 DIAGNOSIS — G4733 Obstructive sleep apnea (adult) (pediatric): Secondary | ICD-10-CM | POA: Insufficient documentation

## 2017-07-09 DIAGNOSIS — I2510 Atherosclerotic heart disease of native coronary artery without angina pectoris: Secondary | ICD-10-CM | POA: Diagnosis not present

## 2017-07-09 DIAGNOSIS — I110 Hypertensive heart disease with heart failure: Secondary | ICD-10-CM | POA: Diagnosis not present

## 2017-07-09 DIAGNOSIS — I25708 Atherosclerosis of coronary artery bypass graft(s), unspecified, with other forms of angina pectoris: Secondary | ICD-10-CM | POA: Diagnosis not present

## 2017-07-09 DIAGNOSIS — N183 Chronic kidney disease, stage 3 unspecified: Secondary | ICD-10-CM

## 2017-07-09 DIAGNOSIS — E039 Hypothyroidism, unspecified: Secondary | ICD-10-CM

## 2017-07-09 DIAGNOSIS — Z7901 Long term (current) use of anticoagulants: Secondary | ICD-10-CM

## 2017-07-09 DIAGNOSIS — D508 Other iron deficiency anemias: Secondary | ICD-10-CM | POA: Diagnosis not present

## 2017-07-09 DIAGNOSIS — I480 Paroxysmal atrial fibrillation: Secondary | ICD-10-CM

## 2017-07-09 DIAGNOSIS — I447 Left bundle-branch block, unspecified: Secondary | ICD-10-CM | POA: Diagnosis not present

## 2017-07-09 DIAGNOSIS — D696 Thrombocytopenia, unspecified: Secondary | ICD-10-CM | POA: Diagnosis not present

## 2017-07-09 DIAGNOSIS — Z23 Encounter for immunization: Secondary | ICD-10-CM

## 2017-07-09 DIAGNOSIS — M1990 Unspecified osteoarthritis, unspecified site: Secondary | ICD-10-CM | POA: Diagnosis not present

## 2017-07-09 DIAGNOSIS — I10 Essential (primary) hypertension: Secondary | ICD-10-CM | POA: Diagnosis not present

## 2017-07-09 DIAGNOSIS — Z95810 Presence of automatic (implantable) cardiac defibrillator: Secondary | ICD-10-CM | POA: Diagnosis not present

## 2017-07-09 LAB — COMPREHENSIVE METABOLIC PANEL
ALK PHOS: 112 U/L (ref 38–126)
ALT: 9 U/L — AB (ref 17–63)
AST: 21 U/L (ref 15–41)
Albumin: 4.2 g/dL (ref 3.5–5.0)
Anion gap: 9 (ref 5–15)
BUN: 26 mg/dL — ABNORMAL HIGH (ref 6–20)
CALCIUM: 9.2 mg/dL (ref 8.9–10.3)
CO2: 28 mmol/L (ref 22–32)
CREATININE: 1.34 mg/dL — AB (ref 0.61–1.24)
Chloride: 102 mmol/L (ref 101–111)
GFR calc non Af Amer: 47 mL/min — ABNORMAL LOW (ref >60)
GFR, EST AFRICAN AMERICAN: 55 mL/min — AB (ref >60)
Glucose, Bld: 146 mg/dL — ABNORMAL HIGH (ref 65–99)
Potassium: 4.3 mmol/L (ref 3.5–5.1)
Sodium: 139 mmol/L (ref 135–145)
Total Bilirubin: 0.8 mg/dL (ref 0.3–1.2)
Total Protein: 6.5 g/dL (ref 6.5–8.1)

## 2017-07-09 LAB — CBC
HCT: 39.3 % (ref 39.0–52.0)
HEMOGLOBIN: 12.8 g/dL — AB (ref 13.0–17.0)
MCH: 30.9 pg (ref 26.0–34.0)
MCHC: 32.6 g/dL (ref 30.0–36.0)
MCV: 94.9 fL (ref 78.0–100.0)
PLATELETS: 128 10*3/uL — AB (ref 150–400)
RBC: 4.14 MIL/uL — ABNORMAL LOW (ref 4.22–5.81)
RDW: 14 % (ref 11.5–15.5)
WBC: 7.2 10*3/uL (ref 4.0–10.5)

## 2017-07-09 LAB — PROTIME-INR
INR: 1.79
PROTHROMBIN TIME: 20.6 s — AB (ref 11.4–15.2)

## 2017-07-09 LAB — DIGOXIN LEVEL: DIGOXIN LVL: 0.5 ng/mL — AB (ref 0.8–2.0)

## 2017-07-09 LAB — BRAIN NATRIURETIC PEPTIDE: B Natriuretic Peptide: 219 pg/mL — ABNORMAL HIGH (ref 0.0–100.0)

## 2017-07-09 LAB — TSH: TSH: 0.888 u[IU]/mL (ref 0.350–4.500)

## 2017-07-09 MED ORDER — POTASSIUM CHLORIDE CRYS ER 20 MEQ PO TBCR
20.0000 meq | EXTENDED_RELEASE_TABLET | Freq: Every day | ORAL | 3 refills | Status: DC
Start: 2017-07-09 — End: 2017-10-18

## 2017-07-09 MED ORDER — BISOPROLOL FUMARATE 5 MG PO TABS: 2.5000 mg | ORAL_TABLET | Freq: Every day | ORAL | 3 refills | Status: DC

## 2017-07-09 MED ORDER — DOXYCYCLINE HYCLATE 100 MG PO TABS: 100.0000 mg | ORAL_TABLET | Freq: Two times a day (BID) | ORAL | 0 refills | Status: DC

## 2017-07-09 MED ORDER — MUPIROCIN 2 % EX OINT: TOPICAL_OINTMENT | CUTANEOUS | 3 refills | Status: DC

## 2017-07-09 MED FILL — BISOPROLOL FUMARATE 5 MG TA: 5 | 30 days supply | Qty: 15 | Fill #0 | Status: TO

## 2017-07-09 NOTE — Progress Notes (Signed)
[name] [name] is a 84 y.o. male who presents to [name]: Primary Care Sports Medicine today for Heart Failure, A Fib, Hypothyroid, Parkinsonian features, and right thumb infection.   Heart Failure: [name] has Systolic Heart Failure that is managed with cardiology with the meds listed below. He tokes torsemide as needed based on weight and leg swelling. He does not like to take torsemide frequently because it causes excessive urination. He feels well and notes that his exertional ability has improved.   A. Fib: [name] has A fib with anticoagulation. He is managed with warfarin 5mg  4 days and week and 6 mg 3 days a week. He feels well and denies any excessive bleeding.   Hypothyroid: [name] take levothyroxine. He feels well with no skin, hair or nail changes. His last TSH was 4 months ago.   Parkinsonian Features: [name] takes Sinemet prescribed originally by neurology for Parkinson's features.  He notes this medication has dramatically improved his ability to walk feels great denies any significant tremor.  Right Thumb Infection: [name] notes pain and redness at the radial border of the right thumb nail.  He has been trying to apply antibiotic ointment for a few days which has not helped much.  He denies any fevers or discharge.  He notes that it is bleeding a bit.  Past Medical History:  Diagnosis Date  . Anemia, unspecified 04/07/2013  . Atrial fibrillation (HCC)    fibrillation/flutter  . BPH (benign prostatic hypertrophy)   . Chronic systolic CHF (congestive heart failure) (HCC)    Nonischemic DCM EF 10% s/p BiV AICD  . Coronary artery disease 2008   nonobstructive ASCAD  . Depression   . Hyperlipidemia   . Hypertension   . Hypothyroidism   . Left bundle branch block   . Nonischemic cardiomyopathy (HCC)    ef 10 %  . OSA (obstructive sleep apnea)    intolerant to CPAP  .  Osteoarthritis   . Sleep apnea    stopped using CPAP  . Thrombocytopenia (HCC)    Past Surgical History:  Procedure Laterality Date  . CARDIAC DEFIBRILLATOR PLACEMENT     left chest  . CARDIOVERSION N/A 12/29/2012   Procedure: CARDIOVERSION;  Surgeon: Quintella Reichert, MD;  Location: [facility];  Service: Cardiovascular;  Laterality: N/A;  . CHOLECYSTECTOMY    . Colonscopy     reportedly negative per pt; around 2005.    . EP IMPLANTABLE DEVICE N/A 05/09/2016   Procedure: BIVI ICD Generator Changeout;  Surgeon: Duke Salvia, MD;  Location: [facility] INVASIVE CV LAB;  Service: Cardiovascular;  Laterality: N/A;  . TRANSURETHRAL RESECTION OF PROSTATE     for BPH   Social History   Tobacco Use  . Smoking status: Never Smoker  . Smokeless tobacco: Never Used  Substance Use Topics  . Alcohol use: No   family history includes Cancer in his maternal uncle; Heart disease in his father; Heart failure in his mother; Stroke in his mother.  ROS as above:  Medications: Current Outpatient Medications  Medication Sig Dispense Refill  . acetaminophen (TYLENOL) 650 MG CR tablet Take 1 tablet (650 mg total) by mouth every 8 (eight) hours as needed for pain. 90 tablet 3  . AMBULATORY NON FORMULARY MEDICATION Front wheel rolling [place] for use as needed due to gait instability. Dispense 1   Fax to [phone]  R26.81 1 each 0  . carbidopa-levodopa (SINEMET IR) 25-100 MG tablet Take 1 tablet  2 (two) times daily by mouth. 180 tablet 1  . diclofenac sodium (VOLTAREN) 1 % GEL Apply 4 g topically 4 (four) times daily. To affected joint. 100 g 11  . digoxin (DIGOX) 0.125 MG tablet Take 1 tablet (125 mcg total) by mouth every other day. 45 tablet 2  . levothyroxine (SYNTHROID, LEVOTHROID) 137 MCG tablet TAKE 1 TABLET BY MOUTH  DAILY BEFORE BREAKFAST 90 tablet 1  . potassium chloride SA (K-DUR,KLOR-CON) 20 MEQ tablet Take 1 tablet (20 mEq total) by mouth daily. 90 tablet 3  . ramipril (ALTACE) 2.5 MG  capsule Take 1 capsule (2.5 mg total) by mouth daily. 90 capsule 3  . simvastatin (ZOCOR) 10 MG tablet Take 1 tablet (10 mg total) by mouth every evening. 90 tablet 2  . spironolactone (ALDACTONE) 25 MG tablet Take 0.5 tablets (12.5 mg total) by mouth daily. 45 tablet 2  . torsemide (DEMADEX) 20 MG tablet Take 1 tablet (20 mg total) daily by mouth. 30 tablet 6  . warfarin (COUMADIN) 1 MG tablet Take 1mg  pill in addition to 5mg  pill on Monday, Wed, Friday 90 tablet 1  . warfarin (COUMADIN) 5 MG tablet Take 1 tablet (5 mg total) by mouth daily at 6 PM. 30 tablet 0  . doxycycline (VIBRA-TABS) 100 MG tablet Take 1 tablet (100 mg total) by mouth 2 (two) times daily. 14 tablet 0  . mupirocin ointment (BACTROBAN) 2 % Apply to affected area BID for 7 days. 30 g 3   No current facility-administered medications for this visit.    Allergies  Allergen Reactions  . Amiodarone Other (See Comments)    Neuro toxicity    Health Maintenance Health Maintenance  Topic Date Due  . PNA vac Low Risk Adult (1 of 2 - PCV13) 05/29/1999  . TETANUS/TDAP  07/01/2019  . INFLUENZA VACCINE  Completed     Exam:  BP 113/71   Pulse 90   Ht 6\' 3"  (1.905 m)   Wt 207 lb (93.9 kg)   BMI 25.87 kg/m  Gen: Well NAD HEENT: EOMI,  MMM Lungs: Normal work of breathing. CTABL Heart: Normal rate. Abd: NABS, Soft. Nondistended, Nontender Exts: Brisk capillary refill, warm and well perfused.  Trace edema bilateral lower extremities. Right thumbnail slowly erythematous at the distal radial border.  Tender to touch.  No discharge.   No results found for this or any previous visit (from the past 72 hour(s)). No results found.    Assessment and Plan: 84 y.o. male with Heart Failure: Doing reasonably well.  Will check basic labs listed below follow-up with cardiology as well.  Anticoagulation: Check INR.  Typically doing quite well continue current regimen.  Hypothyroidism: Doing well continue current regimen check  TSH.  Parkinson's features: Doing quite well.  Continue current regimen.  Skin infection in her fingernail not quite paronychia.  Mupirocin antibiotic ointment if not better oral doxycycline.  Pneumonia vaccine given today prior to discharge (Pneumovax 23)   Orders Placed This Encounter  Procedures  . Pneumococcal polysaccharide vaccine 23-valent greater than or equal to 2yo subcutaneous/IM  . CBC  . COMPLETE METABOLIC PANEL WITH GFR  . B Nat Peptide  . TSH  . INR/PT  . Digoxin level   Meds ordered this encounter  Medications  . potassium chloride SA (K-DUR,KLOR-CON) 20 MEQ tablet    Sig: Take 1 tablet (20 mEq total) by mouth daily.    Dispense:  90 tablet    Refill:  3  . mupirocin ointment (BACTROBAN)  2 %    Sig: Apply to affected area BID for 7 days.    Dispense:  30 g    Refill:  3  . doxycycline (VIBRA-TABS) 100 MG tablet    Sig: Take 1 tablet (100 mg total) by mouth 2 (two) times daily.    Dispense:  14 tablet    Refill:  0     Discussed warning signs or symptoms. Please see discharge instructions. Patient expresses understanding.

## 2017-07-09 NOTE — Patient Instructions (Signed)
Thank you for coming in today. Get labs today.  Start topical antibiotic.  If not better take oral doxycycline antibiotic twice daily for 1 week.  Recheck with me in 1-2 months.

## 2017-07-09 NOTE — Patient Instructions (Addendum)
Bisoprolol 2.5 mg (1/2 tab) every night   Labs drawn today (if we do not call you, then your lab work was stable)   Your physician recommends that you schedule a follow-up appointment in: 3 months with Dr. Shirlee Latch  (we will call you)

## 2017-07-10 NOTE — Progress Notes (Signed)
Patient ID: [name] [name], male   DOB: [birth year], 84 y.o.   MRN: [id number]     Advanced Heart Failure Clinic Note   Primary Cardiologist: Dr Mayford Knife PCP: Dr Denyse Amass HF Cardiology: Shirlee Latch HPI: [name] [name] is a 84 y.o. male with PMH of A tach s/p DC-CV 12/29/12, CAD, PAF on chronic coumadin and amiodarone, OSA- CPAP, NICM cath 2008, chronic systolic heart failure EF 25-30% (01/2013) , S/P Medtronic CRT-D 2008 and generator changed 2012, LBBB, CRI (baseline 1.7-1.8) and Hypothyroidism.   He is S/P DC-CV 12/29/12 and initially he felt good for about a week. He had a functional decline and was only able to walk a few steps. He presented [facility] ED 01/22/13 with increased dyspnea on exertion and low extremity edema. He was started on a lasix gtt 10 mg hr. Admit weight 233 lbs. Discharge weight: 214 lbs.    He has been off Coreg due to fatigue.  He is off amiodarone due to imbalance/gait instability.   He was started on Sinemet by neurology, but was told that he does NOT have Parkinsons but has some of the features.  Since starting Sinemet, his movement has been better.   Patient presents for followup of CHF.   He has been taking torsemide regularly, weight down 2 lbs.  No dyspnea walking with his [place] on flat ground.  No orthopnea/PND.  No BRBPR/melena (on warfarin).  No lightheadedness, falls. He has been walking more.   Echo 10/31/2016 LVEF 35-40%, Mod AI, Mod MR, Severe LAE, Moderate RVH, Severe RAE, PA peak pressure 19 mm Hg  ECG (personally reviewed): ?NSR, BiV pacing  Labs 8/21: Na 132 K 4.5 Cr 1.7 (stable)  Labs 03/02/13 NA 135 Potassium 4.6 Creatinine 1.6 Dig 1.6 TSH 3.95        04/02/13: digoxin 0.8, K+ 4.5, creatinine 1.48         09/08/13 K 4.4 Creatine 1.7          6/18: K 4, creatinine 1.32, TSH normal, hgb 12.9         9/18: TSH normal, K 4.4, creatinine 1.37, LFTs normal         11/18: K 4.1, creatinine 1.47, digoxin 0.3         Review of systems complete and found to be negative unless  listed in HPI.    Past Medical History:  Diagnosis Date  . Anemia, unspecified 04/07/2013  . Atrial fibrillation (HCC)    fibrillation/flutter  . BPH (benign prostatic hypertrophy)   . Chronic systolic CHF (congestive heart failure) (HCC)    Nonischemic DCM EF 10% s/p BiV AICD  . Coronary artery disease 2008   nonobstructive ASCAD  . Depression   . Hyperlipidemia   . Hypertension   . Hypothyroidism   . Left bundle branch block   . Nonischemic cardiomyopathy (HCC)    ef 10 %  . OSA (obstructive sleep apnea)    intolerant to CPAP  . Osteoarthritis   . Sleep apnea    stopped using CPAP  . Thrombocytopenia (HCC)     Current Outpatient Medications  Medication Sig Dispense Refill  . acetaminophen (TYLENOL) 650 MG CR tablet Take 1 tablet (650 mg total) by mouth every 8 (eight) hours as needed for pain. 90 tablet 3  . AMBULATORY NON FORMULARY MEDICATION Front wheel rolling [place] for use as needed due to gait instability. Dispense 1   Fax to [phone]  R26.81 1 each 0  . carbidopa-levodopa (SINEMET IR) 25-100 MG  tablet Take 1 tablet 2 (two) times daily by mouth. 180 tablet 1  . diclofenac sodium (VOLTAREN) 1 % GEL Apply 4 g topically 4 (four) times daily. To affected joint. 100 g 11  . digoxin (DIGOX) 0.125 MG tablet Take 1 tablet (125 mcg total) by mouth every other day. 45 tablet 2  . doxycycline (VIBRA-TABS) 100 MG tablet Take 1 tablet (100 mg total) by mouth 2 (two) times daily. 14 tablet 0  . levothyroxine (SYNTHROID, LEVOTHROID) 137 MCG tablet TAKE 1 TABLET BY MOUTH  DAILY BEFORE BREAKFAST 90 tablet 1  . mupirocin ointment (BACTROBAN) 2 % Apply to affected area BID for 7 days. 30 g 3  . potassium chloride SA (K-DUR,KLOR-CON) 20 MEQ tablet Take 1 tablet (20 mEq total) by mouth daily. 90 tablet 3  . ramipril (ALTACE) 2.5 MG capsule Take 1 capsule (2.5 mg total) by mouth daily. 90 capsule 3  . simvastatin (ZOCOR) 10 MG tablet Take 1 tablet (10 mg total) by mouth every  evening. 90 tablet 2  . spironolactone (ALDACTONE) 25 MG tablet Take 0.5 tablets (12.5 mg total) by mouth daily. 45 tablet 2  . torsemide (DEMADEX) 20 MG tablet Take 1 tablet (20 mg total) daily by mouth. 30 tablet 6  . warfarin (COUMADIN) 1 MG tablet Take 1mg  pill in addition to 5mg  pill on Monday, Wed, Friday 90 tablet 1  . warfarin (COUMADIN) 5 MG tablet Take 1 tablet (5 mg total) by mouth daily at 6 PM. 30 tablet 0  . bisoprolol (ZEBETA) 5 MG tablet Take 0.5 tablets (2.5 mg total) by mouth daily. 15 tablet 3   No current facility-administered medications for this encounter.     Vitals:   07/09/17 1139  BP: 118/66  Pulse: 82  SpO2: 100%  Weight: 207 lb 8 oz (94.1 kg)   Wt Readings from Last 3 Encounters:  07/09/17 207 lb 8 oz (94.1 kg)  07/09/17 207 lb (93.9 kg)  06/11/17 205 lb (93 kg)    PHYSICAL EXAM: General: NAD Neck: No JVD, no thyromegaly or thyroid nodule.  Lungs: Clear to auscultation bilaterally with normal respiratory effort. CV: Nondisplaced PMI.  Heart regular S1/S2, no S3/S4, no murmur. 1+ ankle edema.  No carotid bruit.  Normal pedal pulses.  Abdomen: Soft, nontender, no hepatosplenomegaly, no distention.  Skin: Intact without lesions or rashes.  Neurologic: Alert and oriented x 3.  Psych: Normal affect. Extremities: No clubbing or cyanosis.  HEENT: Normal.   ASSESSMENT & PLAN:  1) Chronic systolic HF: NICM 01/2013 EF 20%; Medtronic CRT-D.  Most recent echo 10/2016 LVEF 35-40%, severe LAE, severe RAE, PA peak pressure 19 mm Hg.  NYHA class II symptoms though not very active.  He is not volume overloaded on exam. - Continue torsemide 20 mg daily, BMET today.  - Start bisoprolol 2.5 mg qhs (did not tolerate Coreg).   - Continue spironolactone and ramipril at current doses.   - Continue digoxin, check level.  2) OSA: Continue nightly CPAP 3) Atrial tachy/AF: He is probably in NSR with small P waves today. He was taken off amiodarone due to gait  instability/imbalance, I will not restart.  - Continue warfarin.  4) Deconditioning: Keep working to stay active.   Followup in 3 months  Marca Ancona, MD  07/10/2017

## 2017-07-11 ENCOUNTER — Other Ambulatory Visit (HOSPITAL_COMMUNITY): Payer: Self-pay

## 2017-07-11 MED ORDER — BISOPROLOL FUMARATE 5 MG PO TABS: 5.0000 mg | ORAL_TABLET | Freq: Every day | ORAL | 3 refills | Status: DC

## 2017-07-11 NOTE — Telephone Encounter (Signed)
Advanced Heart Failure Triage Encounter  Patient Name: [name] [name]  Date of Call: 07/11/17  Problem:  Pt called in with c/o of bisoprolol being to small to cut in half and pt requesting to take I tab every other day.   Plan:  Per Dr. Shirlee Latch  Pt is to take 1 tab daily. New Rx sent into pharmacy and pt aware.   Teresa Coombs, RN

## 2017-08-05 NOTE — Progress Notes (Signed)
HPI: FU CHF. Previously followed by Dr Mayford Knife. Cath 2008 showed nonobstructive CAD. Echo 5/18 showed EF 35-40, moderate AI, moderate MR, severe biatrial enlargement, moderate RVE. Also with h/o a tach s/p DCCV 12/29/12, PAF, OSA- CPAP, chronic systolic heart failure EF 25-30% (01/2013), S/P Medtronic CRT-D 2008 and generator changed 2012, LBBB, CRI (baseline 1.7-1.8). He has been off Coreg due to fatigue.  He is off amiodarone due to imbalance/gait instability. Since last seen, patient denies dyspnea, chest pain, palpitations or syncope.  No pedal edema.  Current Outpatient Medications  Medication Sig Dispense Refill  . acetaminophen (TYLENOL) 650 MG CR tablet Take 1 tablet (650 mg total) by mouth every 8 (eight) hours as needed for pain. 90 tablet 3  . AMBULATORY NON FORMULARY MEDICATION Front wheel rolling [place] for use as needed due to gait instability. Dispense 1   Fax to [phone]  R26.81 1 each 0  . bisoprolol (ZEBETA) 5 MG tablet Take 1 tablet (5 mg total) by mouth daily. 30 tablet 3  . carbidopa-levodopa (SINEMET IR) 25-100 MG tablet Take 1 tablet 2 (two) times daily by mouth. 180 tablet 1  . diclofenac sodium (VOLTAREN) 1 % GEL Apply 4 g topically 4 (four) times daily. To affected joint. 100 g 11  . digoxin (DIGOX) 0.125 MG tablet Take 1 tablet (125 mcg total) by mouth every other day. 45 tablet 2  . levothyroxine (SYNTHROID, LEVOTHROID) 137 MCG tablet TAKE 1 TABLET BY MOUTH  DAILY BEFORE BREAKFAST 90 tablet 1  . mupirocin ointment (BACTROBAN) 2 % Apply to affected area BID for 7 days. 30 g 3  . potassium chloride SA (K-DUR,KLOR-CON) 20 MEQ tablet Take 1 tablet (20 mEq total) by mouth daily. 90 tablet 3  . ramipril (ALTACE) 2.5 MG capsule Take 1 capsule (2.5 mg total) by mouth daily. 90 capsule 3  . simvastatin (ZOCOR) 10 MG tablet Take 1 tablet (10 mg total) by mouth every evening. 90 tablet 2  . spironolactone (ALDACTONE) 25 MG tablet Take 0.5 tablets (12.5 mg total) by mouth  daily. 45 tablet 2  . torsemide (DEMADEX) 20 MG tablet Take 1 tablet (20 mg total) daily by mouth. 30 tablet 6  . warfarin (COUMADIN) 1 MG tablet Take 1mg  pill in addition to 5mg  pill on Monday, Wed, Friday 90 tablet 1  . warfarin (COUMADIN) 5 MG tablet Take 1 tablet (5 mg total) by mouth daily at 6 PM. 30 tablet 0   No current facility-administered medications for this visit.      Past Medical History:  Diagnosis Date  . Anemia, unspecified 04/07/2013  . Atrial fibrillation (HCC)    fibrillation/flutter  . BPH (benign prostatic hypertrophy)   . Chronic systolic CHF (congestive heart failure) (HCC)    Nonischemic DCM EF 10% s/p BiV AICD  . Coronary artery disease 2008   nonobstructive ASCAD  . Depression   . Hyperlipidemia   . Hypertension   . Hypothyroidism   . Left bundle branch block   . Nonischemic cardiomyopathy (HCC)    ef 10 %  . OSA (obstructive sleep apnea)    intolerant to CPAP  . Osteoarthritis   . Sleep apnea    stopped using CPAP  . Thrombocytopenia (HCC)     Past Surgical History:  Procedure Laterality Date  . CARDIAC DEFIBRILLATOR PLACEMENT     left chest  . CARDIOVERSION N/A 12/29/2012   Procedure: CARDIOVERSION;  Surgeon: Quintella Reichert, MD;  Location: [facility] ENDOSCOPY;  Service: Cardiovascular;  Laterality: N/A;  . CHOLECYSTECTOMY    . Colonscopy     reportedly negative per pt; around 2005.    . EP IMPLANTABLE DEVICE N/A 05/09/2016   Procedure: BIVI ICD Generator Changeout;  Surgeon: Duke Salvia, MD;  Location: [facility] INVASIVE CV LAB;  Service: Cardiovascular;  Laterality: N/A;  . TRANSURETHRAL RESECTION OF PROSTATE     for BPH    Social History   Socioeconomic History  . Marital status: Married    Spouse name: Not on file  . Number of children: 4  . Years of education: Not on file  . Highest education level: Not on file  Social Needs  . Financial resource strain: Not on file  . Food insecurity - worry: Not on file  . Food insecurity - inability:  Not on file  . Transportation needs - medical: Not on file  . Transportation needs - non-medical: Not on file  Occupational History  . Occupation: retired    Comment: retired Optometrist; now in Psychiatrist: RETIRED  Tobacco Use  . Smoking status: Never Smoker  . Smokeless tobacco: Never Used  Substance and Sexual Activity  . Alcohol use: No  . Drug use: No  . Sexual activity: No  Other Topics Concern  . Not on file  Social History Narrative  . Not on file    Family History  Problem Relation Age of Onset  . Heart failure Mother   . Stroke Mother   . Heart disease Father   . Cancer Maternal Uncle        Unknown subtype  . Heart attack Neg Hx     ROS: no fevers or chills, productive cough, hemoptysis, dysphasia, odynophagia, melena, hematochezia, dysuria, hematuria, rash, seizure activity, orthopnea, PND, pedal edema, claudication. Remaining systems are negative.  Physical Exam: Well-developed elderly in no acute distress.  Skin is warm and dry.  HEENT is normal.  Neck is supple.  Chest is clear to auscultation with normal expansion.  Cardiovascular exam is regular rate and rhythm.  Abdominal exam nontender or distended. No masses palpated. Extremities show no edema. neuro grossly intact  A/P  1 chronic combined systolic/diastolic congestive heart failure-previous echocardiogram in 2014 showed an ejection fraction of 20%.  He is status post CRT-D.  Last echocardiogram showed ejection fraction 35-40%.  He appears to be doing well from a volume standpoint.  Continue present dose of Demadex.  Continue Spironolactone.  Continue ACE inhibitor and beta-blocker.   Note approximately 20 minutes spent reviewing previous records prior to patient arrival.  2 History of atrial tachycardia and atrial fibrillation.  Patient remains in sinus rhythm.  He was previously taken off of amiodarone due to gait instability.  Continue Coumadin.  Patient not  interested in apixaban due to cost.  3 obstructive sleep apnea-patient using CPAP.  4 history of CRT-D-followed by Dr. Graciela Husbands.  Olga Millers, MD

## 2017-08-09 ENCOUNTER — Encounter: Payer: Self-pay | Admitting: Family Medicine

## 2017-08-09 ENCOUNTER — Ambulatory Visit (INDEPENDENT_AMBULATORY_CARE_PROVIDER_SITE_OTHER): Payer: Medicare Other | Admitting: Family Medicine

## 2017-08-09 VITALS — BP 99/64 | HR 82 | Ht 75.5 in | Wt 206.0 lb

## 2017-08-09 DIAGNOSIS — I428 Other cardiomyopathies: Secondary | ICD-10-CM | POA: Diagnosis not present

## 2017-08-09 DIAGNOSIS — D508 Other iron deficiency anemias: Secondary | ICD-10-CM

## 2017-08-09 DIAGNOSIS — I481 Persistent atrial fibrillation: Secondary | ICD-10-CM

## 2017-08-09 DIAGNOSIS — I5023 Acute on chronic systolic (congestive) heart failure: Secondary | ICD-10-CM | POA: Diagnosis not present

## 2017-08-09 DIAGNOSIS — I4819 Other persistent atrial fibrillation: Secondary | ICD-10-CM

## 2017-08-09 DIAGNOSIS — B351 Tinea unguium: Secondary | ICD-10-CM

## 2017-08-09 DIAGNOSIS — I25708 Atherosclerosis of coronary artery bypass graft(s), unspecified, with other forms of angina pectoris: Secondary | ICD-10-CM

## 2017-08-09 DIAGNOSIS — L84 Corns and callosities: Secondary | ICD-10-CM | POA: Diagnosis not present

## 2017-08-09 DIAGNOSIS — Z7901 Long term (current) use of anticoagulants: Secondary | ICD-10-CM

## 2017-08-09 NOTE — Progress Notes (Signed)
[name] [name] is a 84 y.o. male who presents to [name]: Primary Care Sports Medicine today for callus on foot, anticoagulation, anemia, heart failure.  Callus on foot: [name] [name] notes a callus at the right lateral plantar foot underneath the fifth MTP.  It has been enlarging over the past month or so.  It is now somewhat irritated and interfering with his ability to get his compression socks on.  He is been seen by podiatrist once but did not like [name]. Raynald [name].  He also notes enlarged toenails and trouble trimming his toenails.  He is interested in referral to podiatry and some help with his callus on his foot today.  He has not tried any treatment for it yet.  He has trouble getting to his feet.  Cardiac disease: [name] [name] has a plethora of cardiac abnormalities including atrial fibrillation chronic systolic heart failure sometimes in the severe category, coronary artery disease and bundle-branch blocks.  He is co-managed with [name] [name] for heart failure.  He is done exceptionally well with appropriate diuresis with torsemide.  He notes he is feeling the best he felt in years over the last few months.  He notes his leg swelling has considerably improved now that he is taking torsemide more regularly and his ability to ambulate with a [name] [name] has significantly improved.  He continues taking warfarin listed below and thinks he is probably due for INR.  He denies any bleeding.  Hemoglobin: [name] [name] had hemoglobin checked recently and it had decreased a bit.  He feels pretty well denies any blood in the stool or black tarry stool.  He denies any bleeding.  He feels well otherwise.   Past Medical History:  Diagnosis Date  . Anemia, unspecified 04/07/2013  . Atrial fibrillation (HCC)    fibrillation/flutter  . BPH (benign prostatic hypertrophy)   . Chronic systolic CHF (congestive heart failure) (HCC)    Nonischemic  DCM EF 10% s/p BiV AICD  . Coronary artery disease 2008   nonobstructive ASCAD  . Depression   . Hyperlipidemia   . Hypertension   . Hypothyroidism   . Left bundle branch block   . Nonischemic cardiomyopathy (HCC)    ef 10 %  . OSA (obstructive sleep apnea)    intolerant to CPAP  . Osteoarthritis   . Sleep apnea    stopped using CPAP  . Thrombocytopenia (HCC)    Past Surgical History:  Procedure Laterality Date  . CARDIAC DEFIBRILLATOR PLACEMENT     left chest  . CARDIOVERSION N/A 12/29/2012   Procedure: CARDIOVERSION;  Surgeon: Quintella Reichert, MD;  Location: [facility];  Service: Cardiovascular;  Laterality: N/A;  . CHOLECYSTECTOMY    . Colonscopy     reportedly negative per pt; around 2005.    . EP IMPLANTABLE DEVICE N/A 05/09/2016   Procedure: BIVI ICD Generator Changeout;  Surgeon: Duke Salvia, MD;  Location: [facility] INVASIVE CV LAB;  Service: Cardiovascular;  Laterality: N/A;  . TRANSURETHRAL RESECTION OF PROSTATE     for BPH   Social History   Tobacco Use  . Smoking status: Never Smoker  . Smokeless tobacco: Never Used  Substance Use Topics  . Alcohol use: No   family history includes Cancer in his maternal uncle; Heart disease in his father; Heart failure in his mother; Stroke in his mother.  ROS as above:  Medications: Current Outpatient Medications  Medication Sig Dispense Refill  . acetaminophen (TYLENOL) 650 MG CR tablet  Take 1 tablet (650 mg total) by mouth every 8 (eight) hours as needed for pain. 90 tablet 3  . AMBULATORY NON FORMULARY MEDICATION Front wheel rolling [name] for use as needed due to gait instability. Dispense 1   Fax to [phone]  R26.81 1 each 0  . bisoprolol (ZEBETA) 5 MG tablet Take 1 tablet (5 mg total) by mouth daily. 30 tablet 3  . carbidopa-levodopa (SINEMET IR) 25-100 MG tablet Take 1 tablet 2 (two) times daily by mouth. 180 tablet 1  . diclofenac sodium (VOLTAREN) 1 % GEL Apply 4 g topically 4 (four) times daily. To affected  joint. 100 g 11  . digoxin (DIGOX) 0.125 MG tablet Take 1 tablet (125 mcg total) by mouth every other day. 45 tablet 2  . levothyroxine (SYNTHROID, LEVOTHROID) 137 MCG tablet TAKE 1 TABLET BY MOUTH  DAILY BEFORE BREAKFAST 90 tablet 1  . mupirocin ointment (BACTROBAN) 2 % Apply to affected area BID for 7 days. 30 g 3  . potassium chloride SA (K-DUR,KLOR-CON) 20 MEQ tablet Take 1 tablet (20 mEq total) by mouth daily. 90 tablet 3  . ramipril (ALTACE) 2.5 MG capsule Take 1 capsule (2.5 mg total) by mouth daily. 90 capsule 3  . simvastatin (ZOCOR) 10 MG tablet Take 1 tablet (10 mg total) by mouth every evening. 90 tablet 2  . spironolactone (ALDACTONE) 25 MG tablet Take 0.5 tablets (12.5 mg total) by mouth daily. 45 tablet 2  . torsemide (DEMADEX) 20 MG tablet Take 1 tablet (20 mg total) daily by mouth. 30 tablet 6  . warfarin (COUMADIN) 1 MG tablet Take 1mg  pill in addition to 5mg  pill on Monday, Wed, Friday 90 tablet 1  . warfarin (COUMADIN) 5 MG tablet Take 1 tablet (5 mg total) by mouth daily at 6 PM. 30 tablet 0   No current facility-administered medications for this visit.    Allergies  Allergen Reactions  . Amiodarone Other (See Comments)    Neuro toxicity    Health Maintenance Health Maintenance  Topic Date Due  . PNA vac Low Risk Adult (2 of 2 - PCV13) 07/09/2018  . TETANUS/TDAP  07/01/2019  . INFLUENZA VACCINE  Completed     Exam:  BP 99/64   Pulse 82   Ht 6' 3.5" (1.918 m)   Wt 206 lb (93.4 kg)   BMI 25.41 kg/m  Gen: Well NAD HEENT: EOMI,  MMM Lungs: Normal work of breathing. CTABL Heart: RRR no MRG Abd: NABS, Soft. Nondistended, Nontender Exts: Brisk capillary refill, warm and well perfused.  Right foot: Enlarged thickened toenails present.  Large callus present at the right fifth plantar MTP.  Capillary refill and sensation are intact.  The callus was trimmed using scissors and pared down to the close to the layer of the surrounding skin.  No bleeding  present  Lab Results  Component Value Date   WBC 7.2 07/09/2017   HGB 12.8 (L) 07/09/2017   HCT 39.3 07/09/2017   MCV 94.9 07/09/2017   PLT 128 (L) 07/09/2017    Lab Results  Component Value Date   INR 1.79 07/09/2017   INR 2.2 06/11/2017   INR 1.5 05/07/2017     Assessment and Plan: 84 y.o. male with  Callus: Trimmed today however Tryston has more than just callus foot problems.  He would benefit from podiatry help with his toenails and calluses in his feet.  Refer to podiatry.  Recheck in a month.  Cardiac complaints: Overall doing pretty well.  Rate  is appropriate as his anticoagulation previously as his diuresis.  Heart failure symptoms are improving.  He may benefit from [place]. I will inquire with [name]. Shirlee [name] see if the potential benefit is worth the potential risks.  Additionally will check INR today and adjust warfarin as needed.  Check in 1 month.  Anemia: Jerrelle has slightly lowered hemoglobin from his previous baseline at last check.  We will recheck CBC and iron studies today.  Recheck in 1 month.   Orders Placed This Encounter  Procedures  . CBC  . INR/PT  . Fe+TIBC+Fer  . Ambulatory referral to Podiatry    Referral Priority:   Routine    Referral Type:   Consultation    Referral Reason:   Specialty Services Required    Requested Specialty:   Podiatry    Number of Visits Requested:   1   No orders of the defined types were placed in this encounter.    Discussed warning signs or symptoms. Please see discharge instructions. Patient expresses understanding.

## 2017-08-09 NOTE — Patient Instructions (Addendum)
Thank you for coming in today. Get foot care with podiatry Get labs today Recheck in 1 month.  Return sooner if needed.   Get labs today

## 2017-08-10 LAB — IRON,TIBC AND FERRITIN PANEL
%SAT: 30 % (calc) (ref 15–60)
Ferritin: 374 ng/mL (ref 20–380)
IRON: 80 ug/dL (ref 50–180)
TIBC: 271 ug/dL (ref 250–425)

## 2017-08-10 LAB — CBC
HCT: 39.2 % (ref 38.5–50.0)
HEMOGLOBIN: 13.4 g/dL (ref 13.2–17.1)
MCH: 31.1 pg (ref 27.0–33.0)
MCHC: 34.2 g/dL (ref 32.0–36.0)
MCV: 91 fL (ref 80.0–100.0)
MPV: 11.7 fL (ref 7.5–12.5)
Platelets: 129 10*3/uL — ABNORMAL LOW (ref 140–400)
RBC: 4.31 10*6/uL (ref 4.20–5.80)
RDW: 12.4 % (ref 11.0–15.0)
WBC: 7 10*3/uL (ref 3.8–10.8)

## 2017-08-10 LAB — PROTIME-INR
INR: 1.8 — AB
Prothrombin Time: 18.5 s — ABNORMAL HIGH (ref 9.0–11.5)

## 2017-08-14 ENCOUNTER — Encounter: Payer: Self-pay | Admitting: Cardiology

## 2017-08-14 ENCOUNTER — Ambulatory Visit: Payer: Medicare Other | Admitting: Cardiology

## 2017-08-14 VITALS — BP 114/65 | HR 81 | Ht 75.5 in | Wt 206.0 lb

## 2017-08-14 DIAGNOSIS — I5042 Chronic combined systolic (congestive) and diastolic (congestive) heart failure: Secondary | ICD-10-CM

## 2017-08-14 DIAGNOSIS — I480 Paroxysmal atrial fibrillation: Secondary | ICD-10-CM | POA: Diagnosis not present

## 2017-08-14 DIAGNOSIS — G4733 Obstructive sleep apnea (adult) (pediatric): Secondary | ICD-10-CM

## 2017-08-14 NOTE — Patient Instructions (Signed)
Medication Instructions:  Your physician recommends that you continue on your current medications as directed. Please refer to the Current Medication list given to you today.   Labwork: -None  Testing/Procedures: -None  Follow-Up: Your physician recommends that you keep your scheduled  follow-up appointment in: June with Dr. Jens Som.    Any Other Special Instructions Will Be Listed Below (If Applicable).     If you need a refill on your cardiac medications before your next appointment, please call your pharmacy.

## 2017-08-19 ENCOUNTER — Other Ambulatory Visit: Payer: Self-pay

## 2017-08-19 MED ORDER — DICLOFENAC SODIUM 1 % TD GEL: 4.0000 g | Freq: Four times a day (QID) | TRANSDERMAL | 3 refills | Status: AC

## 2017-08-19 NOTE — Telephone Encounter (Signed)
Kourtney called and requests a refill on Votaren gel. I called [facility] because patient had 11 refills in October. They states the prescription reads 4 g 4 times daily. Which would be about 1,440 grams for a 90 day supply. He has gone through all his refills. Did you want to change the prescription to reflect a 90 day supply?

## 2017-08-20 ENCOUNTER — Ambulatory Visit (INDEPENDENT_AMBULATORY_CARE_PROVIDER_SITE_OTHER): Payer: Medicare Other | Admitting: *Deleted

## 2017-08-20 DIAGNOSIS — I428 Other cardiomyopathies: Secondary | ICD-10-CM | POA: Diagnosis not present

## 2017-08-20 NOTE — Progress Notes (Signed)
Remote ICD transmission.   

## 2017-08-22 ENCOUNTER — Encounter: Payer: Self-pay | Admitting: Cardiology

## 2017-09-03 ENCOUNTER — Ambulatory Visit (INDEPENDENT_AMBULATORY_CARE_PROVIDER_SITE_OTHER): Payer: Medicare Other | Admitting: Family Medicine

## 2017-09-03 ENCOUNTER — Encounter: Payer: Self-pay | Admitting: Family Medicine

## 2017-09-03 VITALS — BP 105/69 | HR 86 | Ht 76.0 in | Wt 208.0 lb

## 2017-09-03 DIAGNOSIS — I714 Abdominal aortic aneurysm, without rupture, unspecified: Secondary | ICD-10-CM

## 2017-09-03 DIAGNOSIS — L84 Corns and callosities: Secondary | ICD-10-CM | POA: Diagnosis not present

## 2017-09-03 DIAGNOSIS — E782 Mixed hyperlipidemia: Secondary | ICD-10-CM | POA: Diagnosis not present

## 2017-09-03 DIAGNOSIS — R259 Unspecified abnormal involuntary movements: Secondary | ICD-10-CM

## 2017-09-03 DIAGNOSIS — I25708 Atherosclerosis of coronary artery bypass graft(s), unspecified, with other forms of angina pectoris: Secondary | ICD-10-CM | POA: Diagnosis not present

## 2017-09-03 DIAGNOSIS — Z95810 Presence of automatic (implantable) cardiac defibrillator: Secondary | ICD-10-CM

## 2017-09-03 DIAGNOSIS — I4819 Other persistent atrial fibrillation: Secondary | ICD-10-CM

## 2017-09-03 DIAGNOSIS — I447 Left bundle-branch block, unspecified: Secondary | ICD-10-CM

## 2017-09-03 DIAGNOSIS — I481 Persistent atrial fibrillation: Secondary | ICD-10-CM

## 2017-09-03 DIAGNOSIS — Z7901 Long term (current) use of anticoagulants: Secondary | ICD-10-CM

## 2017-09-03 DIAGNOSIS — I428 Other cardiomyopathies: Secondary | ICD-10-CM | POA: Diagnosis not present

## 2017-09-03 DIAGNOSIS — I10 Essential (primary) hypertension: Secondary | ICD-10-CM

## 2017-09-03 DIAGNOSIS — I5023 Acute on chronic systolic (congestive) heart failure: Secondary | ICD-10-CM

## 2017-09-03 LAB — POCT INR: INR: 2.2

## 2017-09-03 MED ORDER — BISOPROLOL FUMARATE 5 MG PO TABS: 5.0000 mg | ORAL_TABLET | Freq: Every day | ORAL | 3 refills | Status: DC

## 2017-09-03 NOTE — Progress Notes (Signed)
MARSHA HILLMAN is a 84 y.o. male who presents to [name]: Primary Care Sports Medicine today for foot callus, heart, INR, Parkinson.   Foot callus: Judas was seen last month for several issues including a callus on his right foot.  He decided against going to podiatry and self-care at home with his wife.  He notes he is trim the callus down and it much smaller and no longer painful.  He denies any bleeding.  Riggin also notes a history of multiple cardiac etiologies including atrial fibrillation, Systolic CHF with ICD, CAD and AAA. His pressure is controlled with medication listed below.  He was started on bisoprolol recently and notes that is working quite well and would like to switch to 90 days at a time.  He continues to use his torsemide for diuresis and notes that this is helped a lot to improve his exertional capacity and reduce his leg swelling.  He denies chest pain palpitations or shortness of breath.  He continues simvastatin for HLD.   INR: Giovanni takes warfarin for PPx from A. Fib. He denies any bleeding.  He feels well.  He notes he missed a couple of doses of the 1mg  warfarin due to pharmacy issue 2 weeks ago but is back on track for the last week.  Parkinson's: Yosiel was diagnosed with Parkinson's features and treated with Sinemet almost a year ago.  He notes his exertional capacity and ability to ambulate has improved dramatically.  He has started driving again and is delighted with how well he is doing.  Past Medical History:  Diagnosis Date  . Anemia, unspecified 04/07/2013  . Atrial fibrillation (HCC)    fibrillation/flutter  . BPH (benign prostatic hypertrophy)   . Chronic systolic CHF (congestive heart failure) (HCC)    Nonischemic DCM EF 10% s/p BiV AICD  . Coronary artery disease 2008   nonobstructive ASCAD  . Depression   . Hyperlipidemia   . Hypertension   .  Hypothyroidism   . Left bundle branch block   . Nonischemic cardiomyopathy (HCC)    ef 10 %  . OSA (obstructive sleep apnea)    intolerant to CPAP  . Osteoarthritis   . Sleep apnea    stopped using CPAP  . Thrombocytopenia (HCC)    Past Surgical History:  Procedure Laterality Date  . CARDIAC DEFIBRILLATOR PLACEMENT     left chest  . CARDIOVERSION N/A 12/29/2012   Procedure: CARDIOVERSION;  Surgeon: Quintella Reichert, MD;  Location: [facility];  Service: Cardiovascular;  Laterality: N/A;  . CHOLECYSTECTOMY    . Colonscopy     reportedly negative per pt; around 2005.    . EP IMPLANTABLE DEVICE N/A 05/09/2016   Procedure: BIVI ICD Generator Changeout;  Surgeon: Duke Salvia, MD;  Location: [facility] INVASIVE CV LAB;  Service: Cardiovascular;  Laterality: N/A;  . TRANSURETHRAL RESECTION OF PROSTATE     for BPH   Social History   Tobacco Use  . Smoking status: Never Smoker  . Smokeless tobacco: Never Used  Substance Use Topics  . Alcohol use: No   family history includes Cancer in his maternal uncle; Heart disease in his father; Heart failure in his mother; Stroke in his mother.  ROS as above:  Medications: Current Outpatient Medications  Medication Sig Dispense Refill  . acetaminophen (TYLENOL) 650 MG CR tablet Take 1 tablet (650 mg total) by mouth every 8 (eight) hours as needed for pain. 90 tablet 3  .  AMBULATORY NON FORMULARY MEDICATION Front wheel rolling [place] for use as needed due to gait instability. Dispense 1   Fax to [phone]  R26.81 1 each 0  . bisoprolol (ZEBETA) 5 MG tablet Take 1 tablet (5 mg total) by mouth daily. 90 tablet 3  . carbidopa-levodopa (SINEMET IR) 25-100 MG tablet Take 1 tablet 2 (two) times daily by mouth. 180 tablet 1  . diclofenac sodium (VOLTAREN) 1 % GEL Apply 4 g topically 4 (four) times daily. To affected joint. 1440 g 3  . digoxin (DIGOX) 0.125 MG tablet Take 1 tablet (125 mcg total) by mouth every other day. 45 tablet 2  . levothyroxine  (SYNTHROID, LEVOTHROID) 137 MCG tablet TAKE 1 TABLET BY MOUTH  DAILY BEFORE BREAKFAST 90 tablet 1  . mupirocin ointment (BACTROBAN) 2 % Apply to affected area BID for 7 days. 30 g 3  . potassium chloride SA (K-DUR,KLOR-CON) 20 MEQ tablet Take 1 tablet (20 mEq total) by mouth daily. 90 tablet 3  . ramipril (ALTACE) 2.5 MG capsule Take 1 capsule (2.5 mg total) by mouth daily. 90 capsule 3  . simvastatin (ZOCOR) 10 MG tablet Take 1 tablet (10 mg total) by mouth every evening. 90 tablet 2  . spironolactone (ALDACTONE) 25 MG tablet Take 0.5 tablets (12.5 mg total) by mouth daily. 45 tablet 2  . torsemide (DEMADEX) 20 MG tablet Take 1 tablet (20 mg total) daily by mouth. 30 tablet 6  . warfarin (COUMADIN) 1 MG tablet Take 1mg  pill in addition to 5mg  pill on Monday, Wed, Friday 90 tablet 1  . warfarin (COUMADIN) 5 MG tablet Take 1 tablet (5 mg total) by mouth daily at 6 PM. 30 tablet 0   No current facility-administered medications for this visit.    Allergies  Allergen Reactions  . Amiodarone Other (See Comments)    Neuro toxicity    Health Maintenance Health Maintenance  Topic Date Due  . PNA vac Low Risk Adult (2 of 2 - PCV13) 07/09/2018  . TETANUS/TDAP  07/01/2019  . INFLUENZA VACCINE  Completed     Exam:  BP 105/69   Pulse 86   Ht 6\' 4"  (1.93 m)   Wt 208 lb (94.3 kg)   BMI 25.32 kg/m  Gen: Well NAD HEENT: EOMI,  MMM Lungs: Normal work of breathing. CTABL Heart: RRR no MRG Abd: NABS, Soft. Nondistended, Nontender Exts: Brisk capillary refill, warm and well perfused. Trace edema Right foot callus lateral 5th MTP small smooth and non-tender with no erythema. Euro: Alert and oriented. Patient is able to stand from a seated position using a [place]. He ambulates using a [place]. Trace tremor left hand at rest.   Lab Results  Component Value Date   INR 2.2 09/03/2017   INR 1.8 (H) 08/09/2017   INR 1.79 07/09/2017      Assessment and Plan: 84 y.o. male with  Callus: Doing  well. Plan to continue self care at home. Watchful waiting.   Heart: Variety of issues all seem well managed with excellent cardiology follow up and oversight. Plan for follow with cardiology as arranged. I went ahead and filled the Bisoprolol for 90 days as he was having difficulty getting to the local pharmacy for 30 day refills.   INR: At goal. Continue current dosing protocl. Recheck INR at next check.  Parkinson's: doing well continue current regimen.   We discussed follow-up. He has been seen monthly for some time now because he was initially not stable. However he's done excellently  over the last 3-4 months. I think it's reasonable to start stretching the visits out again to every 2-3 months with work in appointments available as needed.     Orders Placed This Encounter  Procedures  . POCT INR   Meds ordered this encounter  Medications  . bisoprolol (ZEBETA) 5 MG tablet    Sig: Take 1 tablet (5 mg total) by mouth daily.    Dispense:  90 tablet    Refill:  3     Discussed warning signs or symptoms. Please see discharge instructions. Patient expresses understanding.

## 2017-09-03 NOTE — Patient Instructions (Signed)
Thank you for coming in today. Recheck in 2-3 months.  Return sooner if needed.

## 2017-09-05 ENCOUNTER — Ambulatory Visit: Payer: Medicare Other | Admitting: Family Medicine

## 2017-09-06 LAB — CUP PACEART REMOTE DEVICE CHECK
Battery Remaining Longevity: 77 mo
Battery Voltage: 2.99 V
Brady Statistic AP VP Percent: 19.28 %
Brady Statistic AP VS Percent: 0.04 %
Brady Statistic AS VP Percent: 75.27 %
Brady Statistic AS VS Percent: 5.4 %
Brady Statistic RA Percent Paced: 17.41 %
Brady Statistic RV Percent Paced: 93.75 %
Date Time Interrogation Session: 20190226093824
HighPow Impedance: 41 Ohm
HighPow Impedance: 51 Ohm
Implantable Lead Implant Date: 20080806
Implantable Lead Implant Date: 20080806
Implantable Lead Implant Date: 20080806
Implantable Lead Location: 753858
Implantable Lead Location: 753859
Implantable Lead Location: 753860
Implantable Lead Model: 4076
Implantable Lead Model: 6947
Implantable Pulse Generator Implant Date: 20171115
Lead Channel Impedance Value: 342 Ohm
Lead Channel Impedance Value: 399 Ohm
Lead Channel Impedance Value: 437 Ohm
Lead Channel Impedance Value: 494 Ohm
Lead Channel Impedance Value: 608 Ohm
Lead Channel Impedance Value: 950 Ohm
Lead Channel Pacing Threshold Amplitude: 0.75 V
Lead Channel Pacing Threshold Pulse Width: 0.4 ms
Lead Channel Sensing Intrinsic Amplitude: 0.5 mV
Lead Channel Sensing Intrinsic Amplitude: 0.5 mV
Lead Channel Sensing Intrinsic Amplitude: 7.125 mV
Lead Channel Sensing Intrinsic Amplitude: 7.125 mV
Lead Channel Setting Pacing Amplitude: 2 V
Lead Channel Setting Pacing Amplitude: 2 V
Lead Channel Setting Pacing Amplitude: 2.5 V
Lead Channel Setting Pacing Pulse Width: 0.4 ms
Lead Channel Setting Pacing Pulse Width: 0.4 ms
Lead Channel Setting Sensing Sensitivity: 0.3 mV

## 2017-09-09 ENCOUNTER — Other Ambulatory Visit: Payer: Self-pay | Admitting: Family Medicine

## 2017-09-12 ENCOUNTER — Ambulatory Visit (INDEPENDENT_AMBULATORY_CARE_PROVIDER_SITE_OTHER): Payer: Medicare Other

## 2017-09-12 ENCOUNTER — Other Ambulatory Visit: Payer: Self-pay | Admitting: Family Medicine

## 2017-09-12 ENCOUNTER — Ambulatory Visit: Payer: Medicare Other | Admitting: Family Medicine

## 2017-09-12 ENCOUNTER — Encounter: Payer: Self-pay | Admitting: Family Medicine

## 2017-09-12 ENCOUNTER — Ambulatory Visit (INDEPENDENT_AMBULATORY_CARE_PROVIDER_SITE_OTHER): Payer: Medicare Other | Admitting: Family Medicine

## 2017-09-12 VITALS — BP 108/64 | HR 74

## 2017-09-12 DIAGNOSIS — M1611 Unilateral primary osteoarthritis, right hip: Secondary | ICD-10-CM | POA: Diagnosis not present

## 2017-09-12 DIAGNOSIS — Z7409 Other reduced mobility: Secondary | ICD-10-CM

## 2017-09-12 DIAGNOSIS — M79651 Pain in right thigh: Secondary | ICD-10-CM | POA: Diagnosis not present

## 2017-09-12 DIAGNOSIS — S72001A Fracture of unspecified part of neck of right femur, initial encounter for closed fracture: Secondary | ICD-10-CM

## 2017-09-12 DIAGNOSIS — M25551 Pain in right hip: Secondary | ICD-10-CM

## 2017-09-12 DIAGNOSIS — M160 Bilateral primary osteoarthritis of hip: Secondary | ICD-10-CM | POA: Diagnosis not present

## 2017-09-12 MED ORDER — TRAMADOL HCL 50 MG PO TABS: ORAL_TABLET | ORAL | 0 refills | Status: DC

## 2017-09-12 NOTE — Patient Instructions (Addendum)
Thank you for coming in today. Take the medication for pain as needed.  Recheck with me Monday or Tuesday.  If you cannot take care of yourself at home with home health PT let me know.  Next best option is the hospital.  Use the leg brace as needed.   Quadriceps Strain A quadriceps strain is an injury to the muscles or tendons on the front of the thigh. The quadriceps muscles are used in straightening the knee and bending the hip. A strain occurs when the muscle is overstretched or overloaded. There are three types of strains:  Grade 1 is a mild strain. It involves a stretching or minor tearing of your muscle fibers or tendons. You should have little, if any, trouble using your thigh.  Grade 2 is a moderate strain. It involves a partial tearing of your muscle fibers or tendons. You will have pain and some loss of strength in your thigh.  Grade 3 is a severe strain. It involves a complete tearing of your muscle fibers or tendon. It causes severe pain and loss of strength in your thigh.  Recovery will take a few weeks or longer, depending on how bad your strain is. What are the causes? This injury is caused by overextending the muscles in the thigh. What increases the risk? The following factors may make you more likely to develop this injury:  Participating in: ? Activities that involve jumping or sprinting. ? Contact sports, such as football or soccer.  Having a previous injury to your thigh or knee.  Having poor strength and flexibility.  Not warming up properly before activity.  Having one leg that is much stronger than the other.  Exercising to the point of exhaustion.  What are the signs or symptoms? Symptoms of this condition include:  Sudden, severe pain in your thigh.  Pain and tenderness over your quadriceps muscles. The pain gets worse when you use these muscles.  Muscle spasm in your thigh.  Bruising.  Having trouble with tasks that involve using your  quadriceps muscle, such as walking.  A crackling sound when the tendon is moved or touched.  How is this diagnosed? This condition may be diagnosed based on your symptoms, your medical history, and a physical exam. Your health care provider may order tests such as an X-ray, ultrasound, or MRI to further evaluate your injury and to rule out other conditions. How is this treated? Treatment for this condition may include:  Resting your leg and avoiding activities that cause pain.  Taking medicine to help reduce pain and inflammation.  Applying ice to the area to relieve swelling and inflammation.  Using crutches until you can walk without pain.  Working with a physical therapist on exercises to restore strength and flexibility in your thigh.  In rare cases, surgery may be needed. Follow these instructions at home: Managing pain, stiffness, and swelling  If directed, put ice on the injured area: ? Put ice in a plastic bag. ? Place a towel between your skin and the bag. ? Leave the ice on for 20 minutes, 2-3 times a day.  Raise (elevate) the injured area above the level of your heart while you are sitting or lying down. Activity  Do not use the injured limb to support your body weight until your health care provider says that you can. Use crutches as told by your health care provider.  Do exercises as told by your health care provider.  Return to your normal activities as  told by your health care provider. Ask your health care provider what activities are safe for you. General instructions  Take over-the-counter and prescription medicines only as told by your health care provider.  Keep all follow-up visits as told by your health care provider. This is important. How is this prevented?  Warm up and stretch before being active.  Cool down and stretch after being active.  Give your body time to rest between periods of activity.  Make sure to use equipment that fits  you.  Be safe and responsible while being active to avoid falls.  Do at least 150 minutes of moderate-intensity exercise each week, such as brisk walking or water aerobics.  Maintain physical fitness, including: ? Strength ? Flexibility. ? Cardiovascular fitness. ? Endurance. Contact a health care provider if:  Your pain, bruising, or tenderness gets worse, even with treatment.  Your leg becomes weaker. This information is not intended to replace advice given to you by your health care provider. Make sure you discuss any questions you have with your health care provider. Document Released: 06/11/2005 Document Revised: 02/14/2016 Document Reviewed: 03/15/2015 Elsevier Interactive Patient Education  [employer].

## 2017-09-12 NOTE — Progress Notes (Signed)
[name] [name] is a 84 y.o. male who presents to [facility] Medicine today for right anterior thigh pain.   [name] [name] was doing great at the last check on 09/03/17.  However about a day ago he developed severe pain in the right anterior thigh.  The pain is worse with standing.  The pain can be so bad at times that he has had trouble standing from a chair and transitioning.  He denies any injury or trauma.  He has not fallen.  He notes the pain is different than his typical pain from knee arthritis.  He denies any radiating pain or weakness.  No fevers chills nausea vomiting or diarrhea.   Past Medical History:  Diagnosis Date  . Anemia, unspecified 04/07/2013  . Atrial fibrillation (HCC)    fibrillation/flutter  . BPH (benign prostatic hypertrophy)   . Chronic systolic CHF (congestive heart failure) (HCC)    Nonischemic DCM EF 10% s/p BiV AICD  . Coronary artery disease 2008   nonobstructive ASCAD  . Depression   . Hyperlipidemia   . Hypertension   . Hypothyroidism   . Left bundle branch block   . Nonischemic cardiomyopathy (HCC)    ef 10 %  . OSA (obstructive sleep apnea)    intolerant to CPAP  . Osteoarthritis   . Sleep apnea    stopped using CPAP  . Thrombocytopenia (HCC)    Past Surgical History:  Procedure Laterality Date  . CARDIAC DEFIBRILLATOR PLACEMENT     left chest  . CARDIOVERSION N/A 12/29/2012   Procedure: CARDIOVERSION;  Surgeon: Quintella Reichert, MD;  [facility]: [facility];  Service: Cardiovascular;  Laterality: N/A;  . CHOLECYSTECTOMY    . Colonscopy     reportedly negative per pt; around 2005.    . EP IMPLANTABLE DEVICE N/A 05/09/2016   Procedure: BIVI ICD Generator Changeout;  Surgeon: Duke Salvia, MD;  Location: [facility] INVASIVE CV LAB;  Service: Cardiovascular;  Laterality: N/A;  . TRANSURETHRAL RESECTION OF PROSTATE     for BPH   Social History   Tobacco Use  . Smoking status: Never Smoker  . Smokeless tobacco:  Never Used  Substance Use Topics  . Alcohol use: No     ROS:  As above No headache, visual changes, nausea, vomiting, diarrhea, constipation, dizziness, abdominal pain, skin rash, fevers, chills, night sweats, weight loss, swollen lymph nodes, body aches, joint swelling, chest pain, shortness of breath, mood changes, visual or auditory hallucinations.    Medications: Current Outpatient Medications  Medication Sig Dispense Refill  . acetaminophen (TYLENOL) 650 MG CR tablet Take 1 tablet (650 mg total) by mouth every 8 (eight) hours as needed for pain. 90 tablet 3  . AMBULATORY NON FORMULARY MEDICATION Front wheel rolling Laiche for use as needed due to gait instability. Dispense 1   Fax to [phone]  R26.81 1 each 0  . bisoprolol (ZEBETA) 5 MG tablet Take 1 tablet (5 mg total) by mouth daily. 90 tablet 3  . carbidopa-levodopa (SINEMET IR) 25-100 MG tablet TAKE 1 TABLET BY MOUTH 2  TIMES DAILY 180 tablet 1  . diclofenac sodium (VOLTAREN) 1 % GEL Apply 4 g topically 4 (four) times daily. To affected joint. 1440 g 3  . digoxin (DIGOX) 0.125 MG tablet Take 1 tablet (125 mcg total) by mouth every other day. 45 tablet 2  . levothyroxine (SYNTHROID, LEVOTHROID) 137 MCG tablet TAKE 1 TABLET BY MOUTH  DAILY BEFORE BREAKFAST 90 tablet 1  .  mupirocin ointment (BACTROBAN) 2 % Apply to affected area BID for 7 days. 30 g 3  . potassium chloride SA (K-DUR,KLOR-CON) 20 MEQ tablet Take 1 tablet (20 mEq total) by mouth daily. 90 tablet 3  . ramipril (ALTACE) 2.5 MG capsule Take 1 capsule (2.5 mg total) by mouth daily. 90 capsule 3  . simvastatin (ZOCOR) 10 MG tablet Take 1 tablet (10 mg total) by mouth every evening. 90 tablet 2  . spironolactone (ALDACTONE) 25 MG tablet Take 0.5 tablets (12.5 mg total) by mouth daily. 45 tablet 2  . torsemide (DEMADEX) 20 MG tablet Take 1 tablet (20 mg total) daily by mouth. 30 tablet 6  . warfarin (COUMADIN) 1 MG tablet Take 1mg  pill in addition to 5mg  pill on  Monday, Wed, Friday 90 tablet 1  . warfarin (COUMADIN) 5 MG tablet Take 1 tablet (5 mg total) by mouth daily at 6 PM. 30 tablet 0  . traMADol (ULTRAM) 50 MG tablet 1-2 tabs by mouth Q8 hours, maximum 6 tabs per day. 15 tablet 0   No current facility-administered medications for this visit.    Allergies  Allergen Reactions  . Amiodarone Other (See Comments)    Neuro toxicity     Exam:  BP 108/64   Pulse 74  General: Well Developed, well nourished, and in no acute distress.  Neuro/Psych: Alert and oriented x3, extra-ocular muscles intact, able to move all 4 extremities, sensation grossly intact. Skin: Warm and dry, no rashes noted.  Respiratory: Not using accessory muscles, speaking in full sentences, trachea midline.  Cardiovascular: Pulses palpable, no extremity edema. Abdomen: Does not appear distended. MSK:  Right hip: Nontender.  Decreased range of motion with pain with internal rotation and flexion both active and passive. Weakness and pain with resisted hip extension. Mild pain but intact strength to adduction No pain with abduction  Right thigh: Normal-appearing with no ecchymosis or swelling. No palpable defects along the anterior thigh.  Right knee: Mild effusion otherwise normal-appearing with no erythema. Range of motion 5-100 degrees with crepitations. Significant pain with resisted knee extension.  Strength is diminished 3/5 to knee extension Strength is intact knee flexion.  Limited musculoskeletal ultrasound of the right leg: Right hip mild effusion no significant bony abnormalities. Right anterior thigh no obvious muscular or tendon defects from the proximal thigh to the quadricep tendon insertion onto the patella. Right knee: Small joint effusion  Procedure: Real-time Ultrasound Guided Injection of right knee Device: GE Logiq E   Images permanently stored and available for review in the ultrasound unit. Verbal informed consent obtained.  Discussed risks and  benefits of procedure. Warned about infection bleeding damage to structures skin hypopigmentation and fat atrophy among others. Patient expresses understanding and agreement Time-out conducted.   Noted no overlying erythema, induration, or other signs of local infection.   Skin prepped in a sterile fashion.   Local anesthesia: Topical Ethyl chloride.   With sterile technique and under real time ultrasound guidance:  80 mg of Kenalog and 3 mL of Marcaine injected easily.   Completed without difficulty   Mild pain in the right knee immediately resolved suggesting accurate placement of the medication.   However the anterior thigh pain did not improve at all following injection. Advised to call if fevers/chills, erythema, induration, drainage, or persistent bleeding.   Images permanently stored and available for review in the ultrasound unit.  Impression: Technically successful ultrasound guided injection.  Not diagnostic for the source of pain.  No results found for this or any previous visit (from the past 48 hour(s)). Ct Hip Right Wo Contrast  Result Date: 09/12/2017 CLINICAL DATA:  Right hip pain. EXAM: CT OF THE RIGHT HIP WITHOUT CONTRAST TECHNIQUE: Multidetector CT imaging of the right hip was performed according to the standard protocol. Multiplanar CT image reconstructions were also generated. COMPARISON:  Radiographs dated 09/12/2017 and CT scan dated 04/08/2006 FINDINGS: Bones/Joint/Cartilage There is no fracture or dislocation. There is moderate osteoarthritis of the right hip joint with diffuse joint space narrowing with marginal osteophytes of the femoral head and acetabulum. There is an ossified 10 mm loose body in the hip joint. Chronic calcific tendinopathy of the distal gluteal tendons and of the distal iliopsoas tendon adjacent to the greater and lesser trochanters respectively. Muscles and Tendons No acute abnormalities. Soft tissues No acute abnormalities. IMPRESSION: 1. No  acute abnormalities. Specifically, no evidence of hip fracture. 2. Moderate osteoarthritis of the right hip joint. Electronically Signed   By: Francene Boyers M.D.   On: 09/12/2017 13:13   Dg Hip Unilat With Pelvis 2-3 Views Right  Result Date: 09/12/2017 CLINICAL DATA:  Acute right thigh pain. EXAM: RIGHT FEMUR 1 VIEW; DG HIP (WITH OR WITHOUT PELVIS) 2-3V RIGHT COMPARISON:  Right hip CT 09/12/2017.  Knee radiographs 11/03/2014. FINDINGS: The distal aspect of the right femur was incompletely imaged. The included portion of the femur appears intact without evidence of fracture or destructive osseous lesion. There is severe left greater than right hip joint space narrowing and marginal osteophyte formation. There is no right hip dislocation. Atherosclerotic vascular calcifications and lower lumbar spondylosis are noted. IMPRESSION: Advanced bilateral hip osteoarthrosis without evidence of acute osseous abnormality. Electronically Signed   By: Sebastian Ache M.D.   On: 09/12/2017 17:34   Dg Femur 1v Right  Result Date: 09/12/2017 CLINICAL DATA:  Acute right thigh pain. EXAM: RIGHT FEMUR 1 VIEW; DG HIP (WITH OR WITHOUT PELVIS) 2-3V RIGHT COMPARISON:  Right hip CT 09/12/2017.  Knee radiographs 11/03/2014. FINDINGS: The distal aspect of the right femur was incompletely imaged. The included portion of the femur appears intact without evidence of fracture or destructive osseous lesion. There is severe left greater than right hip joint space narrowing and marginal osteophyte formation. There is no right hip dislocation. Atherosclerotic vascular calcifications and lower lumbar spondylosis are noted. IMPRESSION: Advanced bilateral hip osteoarthrosis without evidence of acute osseous abnormality. Electronically Signed   By: Sebastian Ache M.D.   On: 09/12/2017 17:34   I personally (independently) visualized and performed the interpretation of the images attached in this note.  Patient had improved ability to weight-bear  and reduce pain with a knee immobilizer brace.  Assessment and Plan: 84 y.o. male with  New onset of severe atraumatic right thigh pain with weakness and difficulty with standing. The etiology after all of the workup listed below remains somewhat unclear.   The initial femur x-ray looked concerning for a possible impacted femoral neck hip fracture.  A dedicated hip series was obtained while I was in the x-ray room with the patient which to my review did not clearly rule out hip fracture.  We decided to proceed with immediate right hip CT scan to definitively rule in or out hip fracture as fracture could explain severe anterior thigh pain and difficulty with weightbearing it is a cannot miss diagnosis in this context.  However fortunately the CT scan of the hip did not show any fractures.  The arthritis is present in the  hip I do not believe is significantly contributing to the thigh pain  The knee arthritis that Jaydenn has been having pain with off and on now for several years I also do not think is contributing significantly.  He had a diagnostic and therapeutic injection and had no benefit from his thigh pain with the Marcaine in the knee joint.  Think at this point the most likely etiology is a simple quadricep strain.  It most of the social circumstances this would not be a severe life problem. However Jakaiden had before his pain difficulty with ambulation.  He now has significant difficulty with transitions and weightbearing.  If he cannot transition by himself at home he cannot be safe at home.  His wife really cannot help him and his family is too far away or to unavailable to help.  Plan to have a trial at home with knee immobilizer, tramadol, and home health physical therapy.  New problem uncertain diagnosis severe symptoms.   Orders Placed This Encounter  Procedures  . DG HIP UNILAT WITH PELVIS 2-3 VIEWS RIGHT    Standing Status:   Future    Number of Occurrences:   1    Standing  Expiration Date:   11/13/2018    Order Specific Question:   Reason for Exam (SYMPTOM  OR DIAGNOSIS REQUIRED)    Answer:   eval pain right thigh. Suspect hip based on initial femur views in xray dept    Order Specific Question:   Preferred imaging location?    Answer:   [facility]    Order Specific Question:   Radiology Contrast Protocol - do NOT remove file path    Answer:   \\[email or web address].pdf  . CT HIP RIGHT WO CONTRAST    Standing Status:   Future    Number of Occurrences:   1    Standing Expiration Date:   12/14/2018    Order Specific Question:   Preferred imaging location?    Answer:   [facility]    Order Specific Question:   Radiology Contrast Protocol - do NOT remove file path    Answer:   \\[email or web address].pdf  . Ambulatory referral to Home Health    Referral Priority:   Routine    Referral Type:   Home Health Care    Referral Reason:   Specialty Services Required    Requested Specialty:   Home Health Services    Number of Visits Requested:   1   Meds ordered this encounter  Medications  . traMADol (ULTRAM) 50 MG tablet    Sig: 1-2 tabs by mouth Q8 hours, maximum 6 tabs per day.    Dispense:  15 tablet    Refill:  0    Discussed warning signs or symptoms. Please see discharge instructions. Patient expresses understanding.

## 2017-09-16 DIAGNOSIS — I2510 Atherosclerotic heart disease of native coronary artery without angina pectoris: Secondary | ICD-10-CM | POA: Diagnosis not present

## 2017-09-16 DIAGNOSIS — N400 Enlarged prostate without lower urinary tract symptoms: Secondary | ICD-10-CM | POA: Diagnosis not present

## 2017-09-16 DIAGNOSIS — I714 Abdominal aortic aneurysm, without rupture: Secondary | ICD-10-CM | POA: Diagnosis not present

## 2017-09-16 DIAGNOSIS — I130 Hypertensive heart and chronic kidney disease with heart failure and stage 1 through stage 4 chronic kidney disease, or unspecified chronic kidney disease: Secondary | ICD-10-CM | POA: Diagnosis not present

## 2017-09-16 DIAGNOSIS — I481 Persistent atrial fibrillation: Secondary | ICD-10-CM | POA: Diagnosis not present

## 2017-09-16 DIAGNOSIS — I447 Left bundle-branch block, unspecified: Secondary | ICD-10-CM | POA: Diagnosis not present

## 2017-09-16 DIAGNOSIS — Z7901 Long term (current) use of anticoagulants: Secondary | ICD-10-CM | POA: Diagnosis not present

## 2017-09-16 DIAGNOSIS — I428 Other cardiomyopathies: Secondary | ICD-10-CM | POA: Diagnosis not present

## 2017-09-16 DIAGNOSIS — D631 Anemia in chronic kidney disease: Secondary | ICD-10-CM | POA: Diagnosis not present

## 2017-09-16 DIAGNOSIS — N183 Chronic kidney disease, stage 3 (moderate): Secondary | ICD-10-CM | POA: Diagnosis not present

## 2017-09-16 DIAGNOSIS — I5022 Chronic systolic (congestive) heart failure: Secondary | ICD-10-CM | POA: Diagnosis not present

## 2017-09-16 DIAGNOSIS — G4733 Obstructive sleep apnea (adult) (pediatric): Secondary | ICD-10-CM | POA: Diagnosis not present

## 2017-09-16 DIAGNOSIS — M48061 Spinal stenosis, lumbar region without neurogenic claudication: Secondary | ICD-10-CM | POA: Diagnosis not present

## 2017-09-16 DIAGNOSIS — Z95810 Presence of automatic (implantable) cardiac defibrillator: Secondary | ICD-10-CM | POA: Diagnosis not present

## 2017-09-16 DIAGNOSIS — E039 Hypothyroidism, unspecified: Secondary | ICD-10-CM | POA: Diagnosis not present

## 2017-09-16 DIAGNOSIS — M160 Bilateral primary osteoarthritis of hip: Secondary | ICD-10-CM | POA: Diagnosis not present

## 2017-09-16 DIAGNOSIS — M170 Bilateral primary osteoarthritis of knee: Secondary | ICD-10-CM | POA: Diagnosis not present

## 2017-09-17 ENCOUNTER — Encounter: Payer: Medicare Other | Admitting: Internal Medicine

## 2017-09-17 ENCOUNTER — Ambulatory Visit (INDEPENDENT_AMBULATORY_CARE_PROVIDER_SITE_OTHER): Payer: Medicare Other | Admitting: Family Medicine

## 2017-09-17 VITALS — BP 101/65 | HR 79

## 2017-09-17 DIAGNOSIS — Z9181 History of falling: Secondary | ICD-10-CM | POA: Diagnosis not present

## 2017-09-17 DIAGNOSIS — S76111A Strain of right quadriceps muscle, fascia and tendon, initial encounter: Secondary | ICD-10-CM | POA: Diagnosis not present

## 2017-09-17 NOTE — Progress Notes (Signed)
[name] [name] is a 84 y.o. male who presents to [name]: Primary Care Sports Medicine today for follow up on knee pain.   Right Quadricep Strain: [name] [name] was seen on 09/12/17 with right anterior thigh pain. After extensive workup the most likely diagnosis was right quad strain. He was treated with a knee immobilizer which has helped a lot. The patient reports minimal improvement in his symptoms. He still reports pain with flexion of his knee and bearing weight on it. Additionally, he reports increased osteoarthritic pain in his left knee due to compensation for his injured right knee. He is still struggling to get up and ambulate. He can stand from a seated position with a Zaffino.  He feels he has a significant fall risk, but denies recent falls. The patient reports difficulty with the knee brace he is currently using. He struggles to wear it properly, put it on, and take it off. Moreover, it is limiting his ability to stand up and sit down.    Past Medical History:  Diagnosis Date  . Anemia, unspecified 04/07/2013  . Atrial fibrillation (HCC)    fibrillation/flutter  . BPH (benign prostatic hypertrophy)   . Chronic systolic CHF (congestive heart failure) (HCC)    Nonischemic DCM EF 10% s/p BiV AICD  . Coronary artery disease 2008   nonobstructive ASCAD  . Depression   . Hyperlipidemia   . Hypertension   . Hypothyroidism   . Left bundle branch block   . Nonischemic cardiomyopathy (HCC)    ef 10 %  . OSA (obstructive sleep apnea)    intolerant to CPAP  . Osteoarthritis   . Sleep apnea    stopped using CPAP  . Thrombocytopenia (HCC)    Past Surgical History:  Procedure Laterality Date  . CARDIAC DEFIBRILLATOR PLACEMENT     left chest  . CARDIOVERSION N/A 12/29/2012   Procedure: CARDIOVERSION;  Surgeon: Quintella Reichert, MD;  Location: [facility] ENDOSCOPY;  Service: Cardiovascular;  Laterality: N/A;   . CHOLECYSTECTOMY    . Colonscopy     reportedly negative per pt; around 2005.    . EP IMPLANTABLE DEVICE N/A 05/09/2016   Procedure: BIVI ICD Generator Changeout;  Surgeon: Duke Salvia, MD;  Location: [facility] INVASIVE CV LAB;  Service: Cardiovascular;  Laterality: N/A;  . TRANSURETHRAL RESECTION OF PROSTATE     for BPH   Social History   Tobacco Use  . Smoking status: Never Smoker  . Smokeless tobacco: Never Used  Substance Use Topics  . Alcohol use: No   family history includes Cancer in his maternal uncle; Heart disease in his father; Heart failure in his mother; Stroke in his mother.  ROS as above:  Medications: Current Outpatient Medications  Medication Sig Dispense Refill  . acetaminophen (TYLENOL) 650 MG CR tablet Take 1 tablet (650 mg total) by mouth every 8 (eight) hours as needed for pain. 90 tablet 3  . AMBULATORY NON FORMULARY MEDICATION Front wheel rolling Leano for use as needed due to gait instability. Dispense 1   Fax to [phone]  R26.81 1 each 0  . bisoprolol (ZEBETA) 5 MG tablet Take 1 tablet (5 mg total) by mouth daily. 90 tablet 3  . carbidopa-levodopa (SINEMET IR) 25-100 MG tablet TAKE 1 TABLET BY MOUTH 2  TIMES DAILY 180 tablet 1  . diclofenac sodium (VOLTAREN) 1 % GEL Apply 4 g topically 4 (four) times daily. To affected joint. 1440 g 3  . digoxin (  DIGOX) 0.125 MG tablet Take 1 tablet (125 mcg total) by mouth every other day. 45 tablet 2  . levothyroxine (SYNTHROID, LEVOTHROID) 137 MCG tablet TAKE 1 TABLET BY MOUTH  DAILY BEFORE BREAKFAST 90 tablet 1  . mupirocin ointment (BACTROBAN) 2 % Apply to affected area BID for 7 days. 30 g 3  . potassium chloride SA (K-DUR,KLOR-CON) 20 MEQ tablet Take 1 tablet (20 mEq total) by mouth daily. 90 tablet 3  . ramipril (ALTACE) 2.5 MG capsule Take 1 capsule (2.5 mg total) by mouth daily. 90 capsule 3  . simvastatin (ZOCOR) 10 MG tablet Take 1 tablet (10 mg total) by mouth every evening. 90 tablet 2  .  spironolactone (ALDACTONE) 25 MG tablet Take 0.5 tablets (12.5 mg total) by mouth daily. 45 tablet 2  . torsemide (DEMADEX) 20 MG tablet Take 1 tablet (20 mg total) daily by mouth. 30 tablet 6  . traMADol (ULTRAM) 50 MG tablet 1-2 tabs by mouth Q8 hours, maximum 6 tabs per day. 15 tablet 0  . warfarin (COUMADIN) 1 MG tablet Take 1mg  pill in addition to 5mg  pill on Monday, Wed, Friday 90 tablet 1  . warfarin (COUMADIN) 5 MG tablet Take 1 tablet (5 mg total) by mouth daily at 6 PM. 30 tablet 0   No current facility-administered medications for this visit.    Allergies  Allergen Reactions  . Amiodarone Other (See Comments)    Neuro toxicity    Health Maintenance Health Maintenance  Topic Date Due  . PNA vac Low Risk Adult (2 of 2 - PCV13) 07/09/2018  . TETANUS/TDAP  07/01/2019  . INFLUENZA VACCINE  Completed     Exam:  BP 101/65   Pulse 79  Gen: Non-toxic appearing  MSK:   Right knee: No obvious effusion or erythema.  Range of motion limited Weakness and pain with resisted knee extension. Strength is diminished 4/5 to knee extension. Strength is intact in knee flexion  Neurovascularly intact   Right thigh: Normal-appearing with no ecchymosis or swelling. No palpable defects along the anterior thigh.     Assessment and Plan: 84 y.o. male with   Right Quadriceps strain: Patient's ability to move his leg is mildly improved since last week. He is still a significant falls risk. We are putting him in a new brace that is easier to move in so that he can stand up and sit down on his own. He is in a ROM limiting hinge knee brace that goes from 0-30 degrees. The restriction to 30 degrees should help him with getting up, sitting up, and stability while standing up and moving. Patient should continue strengthening exercises via home health physical therapy.   Independent living: The patient and his wife still live by themselves independently. The patient is less of a fall risk than  at his prior visit, but they should both consider moving to assisted living. He is improving, which alleviates some worry, but he is still significantly impaired and his wife does not have the physical capacity to help him properly.    No orders of the defined types were placed in this encounter.  No orders of the defined types were placed in this encounter.    Discussed warning signs or symptoms. Please see discharge instructions. Patient expresses understanding.

## 2017-09-17 NOTE — Patient Instructions (Addendum)
Thank you for coming in today. Continue home PT.  Use the knee brace.  I will communicate with the PT.  Recheck in 2 weeks.  Return sooner if needed.

## 2017-09-18 ENCOUNTER — Telehealth: Payer: Self-pay

## 2017-09-18 DIAGNOSIS — E039 Hypothyroidism, unspecified: Secondary | ICD-10-CM | POA: Diagnosis not present

## 2017-09-18 DIAGNOSIS — I481 Persistent atrial fibrillation: Secondary | ICD-10-CM | POA: Diagnosis not present

## 2017-09-18 DIAGNOSIS — I714 Abdominal aortic aneurysm, without rupture: Secondary | ICD-10-CM | POA: Diagnosis not present

## 2017-09-18 DIAGNOSIS — M48061 Spinal stenosis, lumbar region without neurogenic claudication: Secondary | ICD-10-CM | POA: Diagnosis not present

## 2017-09-18 DIAGNOSIS — I130 Hypertensive heart and chronic kidney disease with heart failure and stage 1 through stage 4 chronic kidney disease, or unspecified chronic kidney disease: Secondary | ICD-10-CM | POA: Diagnosis not present

## 2017-09-18 DIAGNOSIS — I428 Other cardiomyopathies: Secondary | ICD-10-CM | POA: Diagnosis not present

## 2017-09-18 DIAGNOSIS — S76111A Strain of right quadriceps muscle, fascia and tendon, initial encounter: Secondary | ICD-10-CM

## 2017-09-18 DIAGNOSIS — I2510 Atherosclerotic heart disease of native coronary artery without angina pectoris: Secondary | ICD-10-CM | POA: Diagnosis not present

## 2017-09-18 DIAGNOSIS — Z7901 Long term (current) use of anticoagulants: Secondary | ICD-10-CM | POA: Diagnosis not present

## 2017-09-18 DIAGNOSIS — M170 Bilateral primary osteoarthritis of knee: Secondary | ICD-10-CM | POA: Diagnosis not present

## 2017-09-18 DIAGNOSIS — Z95810 Presence of automatic (implantable) cardiac defibrillator: Secondary | ICD-10-CM | POA: Diagnosis not present

## 2017-09-18 DIAGNOSIS — G4733 Obstructive sleep apnea (adult) (pediatric): Secondary | ICD-10-CM | POA: Diagnosis not present

## 2017-09-18 DIAGNOSIS — N183 Chronic kidney disease, stage 3 (moderate): Secondary | ICD-10-CM | POA: Diagnosis not present

## 2017-09-18 DIAGNOSIS — M160 Bilateral primary osteoarthritis of hip: Secondary | ICD-10-CM | POA: Diagnosis not present

## 2017-09-18 DIAGNOSIS — N400 Enlarged prostate without lower urinary tract symptoms: Secondary | ICD-10-CM | POA: Diagnosis not present

## 2017-09-18 DIAGNOSIS — D631 Anemia in chronic kidney disease: Secondary | ICD-10-CM | POA: Diagnosis not present

## 2017-09-18 DIAGNOSIS — I447 Left bundle-branch block, unspecified: Secondary | ICD-10-CM | POA: Diagnosis not present

## 2017-09-18 DIAGNOSIS — I5022 Chronic systolic (congestive) heart failure: Secondary | ICD-10-CM | POA: Diagnosis not present

## 2017-09-18 NOTE — Telephone Encounter (Signed)
Patient advised.

## 2017-09-18 NOTE — Telephone Encounter (Signed)
Called back with [facility].  They are working to get services placed and try to avoid hospitalization and possibly directly go to skilled facility. We are actively working on this.   Expect a call from [facility] social work.

## 2017-09-18 NOTE — Telephone Encounter (Signed)
Chen called and wants to talk to Dr Denyse Amass about going into a rehab center. He and his wife are having a hard time with him moving around and keeping the leg wrap on.

## 2017-09-18 NOTE — Telephone Encounter (Signed)
We are activly working on it. We are tying to avoid a hospital visit by getting [facility] care management involved.

## 2017-09-19 ENCOUNTER — Other Ambulatory Visit: Payer: Self-pay

## 2017-09-19 DIAGNOSIS — E039 Hypothyroidism, unspecified: Secondary | ICD-10-CM | POA: Diagnosis not present

## 2017-09-19 DIAGNOSIS — N183 Chronic kidney disease, stage 3 (moderate): Secondary | ICD-10-CM | POA: Diagnosis not present

## 2017-09-19 DIAGNOSIS — I5022 Chronic systolic (congestive) heart failure: Secondary | ICD-10-CM | POA: Diagnosis not present

## 2017-09-19 DIAGNOSIS — N400 Enlarged prostate without lower urinary tract symptoms: Secondary | ICD-10-CM | POA: Diagnosis not present

## 2017-09-19 DIAGNOSIS — I428 Other cardiomyopathies: Secondary | ICD-10-CM | POA: Diagnosis not present

## 2017-09-19 DIAGNOSIS — Z95810 Presence of automatic (implantable) cardiac defibrillator: Secondary | ICD-10-CM | POA: Diagnosis not present

## 2017-09-19 DIAGNOSIS — I481 Persistent atrial fibrillation: Secondary | ICD-10-CM | POA: Diagnosis not present

## 2017-09-19 DIAGNOSIS — M160 Bilateral primary osteoarthritis of hip: Secondary | ICD-10-CM | POA: Diagnosis not present

## 2017-09-19 DIAGNOSIS — I130 Hypertensive heart and chronic kidney disease with heart failure and stage 1 through stage 4 chronic kidney disease, or unspecified chronic kidney disease: Secondary | ICD-10-CM | POA: Diagnosis not present

## 2017-09-19 DIAGNOSIS — I2510 Atherosclerotic heart disease of native coronary artery without angina pectoris: Secondary | ICD-10-CM | POA: Diagnosis not present

## 2017-09-19 DIAGNOSIS — M48061 Spinal stenosis, lumbar region without neurogenic claudication: Secondary | ICD-10-CM | POA: Diagnosis not present

## 2017-09-19 DIAGNOSIS — D631 Anemia in chronic kidney disease: Secondary | ICD-10-CM | POA: Diagnosis not present

## 2017-09-19 DIAGNOSIS — M170 Bilateral primary osteoarthritis of knee: Secondary | ICD-10-CM | POA: Diagnosis not present

## 2017-09-19 DIAGNOSIS — G4733 Obstructive sleep apnea (adult) (pediatric): Secondary | ICD-10-CM | POA: Diagnosis not present

## 2017-09-19 DIAGNOSIS — I447 Left bundle-branch block, unspecified: Secondary | ICD-10-CM | POA: Diagnosis not present

## 2017-09-19 DIAGNOSIS — I714 Abdominal aortic aneurysm, without rupture: Secondary | ICD-10-CM | POA: Diagnosis not present

## 2017-09-19 DIAGNOSIS — Z7901 Long term (current) use of anticoagulants: Secondary | ICD-10-CM | POA: Diagnosis not present

## 2017-09-19 NOTE — Patient Outreach (Signed)
[facility]) Care Management  09/19/2017  [name] [name] March 09, 1932 [id number]   Telephone Screen  Referral Date: 09/18/17 Referral Source: Urgent MD office (Dr. Clementeen Graham) Referral Reason: "patient with new leg injury and cannot care for self at home, needs SNF rehab to avoid hospitalization" Insurance: [facility]   Outreach attempt # 1 to patient. Spoke with both patient and spouse. They both voiced that patient was scheduled to have a SW come to their home today for a visit at 11:30 am. They are inquiring if this is a [facility] SW. Advised them that this was not one of [facility] SWs. Spouse reports she thinks its a SW from [facility]. She reports that patient has had one initial visit from physical therapist and PT is supposed to be back out to see patient. RN CM asked multiple times if the person visiting today was a SW or PT and they both said it was a SW coming to help with getting him placed somewhere. Spouse stets that she wants patient to go to a facility locally In [place]. She has one facility in mind. Advised her to discuss this with SW during visit. RN CM also spoke with both of them regarding the process for getting into a rehab facility and the need for approval by [facility]. They voiced understanding. Advised them that if [facility] SW coming out to assist with placement then [facility] SW services may not be needed. RN CM will call back and follow up with patient/spouse to ensure SW did visit and is assisting with placement. They voiced understanding and was agreeable to the plan.       Plan: RN CM will follow up with patient within three business days.    Antionette Fairy, RN,BSN,CCM [facility] Care Management Telephonic Care Management Coordinator Direct Phone: [phone] Toll Free: [phone] Fax: [phone]

## 2017-09-20 ENCOUNTER — Other Ambulatory Visit: Payer: Self-pay

## 2017-09-20 ENCOUNTER — Telehealth: Payer: Self-pay

## 2017-09-20 NOTE — Patient Outreach (Signed)
[facility]) Care Management  09/20/2017  MERLON VOZZA 28-Jun-1931 [id number]   Care Coordination    RN CM placed call to [facility] at [phone] and requested to speak with patient's assigned SW-Emily. Spoke with SW and discussed and reviewed case. SW confirmed that she had made HV yesterday to discuss facility placement with spouse and patient. They selected three facilities that they are interested in. SW is working on to see if there is availability at these locations. RN CM discussed with SW voicemail message received from PCP. She contacted PCP office yesterday to inform them of he visit. She states that she will be sending FL2 to MD office as soon as possible. Discussed with SW that PCP had requested [facility] SW to assist with placement. However, [facility] SW has already started process for placement. SW states that she spoke at length with both patient and spouse and made them aware that the process takes several days due to paperwork and insurance approval and she reports that were both understanding of the issue.       Antionette Fairy, RN,BSN,CCM [facility] Care Management Telephonic Care Management Coordinator Direct Phone: [phone] Toll Free: [phone] Fax: [phone]

## 2017-09-20 NOTE — Patient Outreach (Signed)
[facility]) Care Management  09/20/2017  [name] [name] 1931/09/05 [id number]   Telephone Screen  Referral Date: 09/18/17 Referral Source: Urgent MD office (Dr. Clementeen Graham) Referral Reason: "patient with new leg injury and cannot care for self at home, needs SNF rehab to avoid hospitalization" Insurance: [facility] Medicare   Outreach attempt #2 to patient. Spoke with spouse. She confirmed that SW from [facility] did come out and see them on yesterday. They provided the SW with a few options in regards to facilities. Spouse voices that SW was going to follow up and see if there was a bed available for the patient. Spouse stets she is concerned because the SW told her it could take about three days or so before they know anything. Spouse inquiring if there is anything [facility] can do to speed up the process. Advised spouse that unfortunately it was not. Explained to spouse tht getting patient in a facility takes a lot of behind the scenes work from MD, insurance and facility. She voiced understanding and was appreciative of follow up call.      Plan: RN CM will contact PCP office.  Antionette Fairy, RN,BSN,CCM [facility] Care Management Telephonic Care Management Coordinator Direct Phone: [phone] Toll Free: [phone] Fax: [phone]

## 2017-09-20 NOTE — Telephone Encounter (Signed)
Dr. Virgel Paling social worker from [facility] home [facility] called and stated that she had visited the family and went over the options with them, she stated that she is looking into a few facilities to see if they have availabilities. She will send over forms to be completed to present to the admissions department. She stated that if you have any questions you can give her a call back. Marek Nghiem,CMA

## 2017-09-20 NOTE — Patient Outreach (Signed)
[facility]) Care Management  09/20/2017  [name] [name] [birth year] [id number]    Telephone Screen  Referral Date:09/18/17 Referral Source:Urgent MD office (Dr. Clementeen Graham) Referral Reason:"patient with new leg injury and cannot care for self at home, needs SNF rehab to avoid hospitalization" Insurance:[employer] Medicare    Voicemail message received from PCP-Dr. Denyse Amass. RN CM placed return call to number requested. No answer after multiple rings and unable to leave message.       Antionette Fairy, RN,BSN,CCM [facility] Care Management Telephonic Care Management Coordinator Direct Phone: [phone] Toll Free: [phone] Fax: [phone]

## 2017-09-23 ENCOUNTER — Telehealth: Payer: Self-pay | Admitting: Family Medicine

## 2017-09-23 DIAGNOSIS — I2510 Atherosclerotic heart disease of native coronary artery without angina pectoris: Secondary | ICD-10-CM | POA: Diagnosis not present

## 2017-09-23 DIAGNOSIS — I714 Abdominal aortic aneurysm, without rupture: Secondary | ICD-10-CM | POA: Diagnosis not present

## 2017-09-23 DIAGNOSIS — I447 Left bundle-branch block, unspecified: Secondary | ICD-10-CM | POA: Diagnosis not present

## 2017-09-23 DIAGNOSIS — D631 Anemia in chronic kidney disease: Secondary | ICD-10-CM | POA: Diagnosis not present

## 2017-09-23 DIAGNOSIS — M170 Bilateral primary osteoarthritis of knee: Secondary | ICD-10-CM | POA: Diagnosis not present

## 2017-09-23 DIAGNOSIS — M48061 Spinal stenosis, lumbar region without neurogenic claudication: Secondary | ICD-10-CM | POA: Diagnosis not present

## 2017-09-23 DIAGNOSIS — N183 Chronic kidney disease, stage 3 (moderate): Secondary | ICD-10-CM | POA: Diagnosis not present

## 2017-09-23 DIAGNOSIS — Z95810 Presence of automatic (implantable) cardiac defibrillator: Secondary | ICD-10-CM | POA: Diagnosis not present

## 2017-09-23 DIAGNOSIS — E039 Hypothyroidism, unspecified: Secondary | ICD-10-CM | POA: Diagnosis not present

## 2017-09-23 DIAGNOSIS — I130 Hypertensive heart and chronic kidney disease with heart failure and stage 1 through stage 4 chronic kidney disease, or unspecified chronic kidney disease: Secondary | ICD-10-CM | POA: Diagnosis not present

## 2017-09-23 DIAGNOSIS — N400 Enlarged prostate without lower urinary tract symptoms: Secondary | ICD-10-CM | POA: Diagnosis not present

## 2017-09-23 DIAGNOSIS — I5022 Chronic systolic (congestive) heart failure: Secondary | ICD-10-CM | POA: Diagnosis not present

## 2017-09-23 DIAGNOSIS — M160 Bilateral primary osteoarthritis of hip: Secondary | ICD-10-CM | POA: Diagnosis not present

## 2017-09-23 DIAGNOSIS — I481 Persistent atrial fibrillation: Secondary | ICD-10-CM | POA: Diagnosis not present

## 2017-09-23 DIAGNOSIS — G4733 Obstructive sleep apnea (adult) (pediatric): Secondary | ICD-10-CM | POA: Diagnosis not present

## 2017-09-23 DIAGNOSIS — I428 Other cardiomyopathies: Secondary | ICD-10-CM | POA: Diagnosis not present

## 2017-09-23 DIAGNOSIS — Z7901 Long term (current) use of anticoagulants: Secondary | ICD-10-CM | POA: Diagnosis not present

## 2017-09-23 NOTE — Telephone Encounter (Signed)
Social worker for [employer] states that pt must be hospitalized for SNF as he has not been hospitalized in 1 year. I am continuing to try with [facility] care management to bypass this.  I have called Florance trying to speak with her about this.   Antionette Fairy, RN,BSN,CCM [facility] Care Management Telephonic Care Management Coordinator Direct Phone: [phone] Toll Free: [phone] Fax: [phone]

## 2017-09-24 ENCOUNTER — Other Ambulatory Visit: Payer: Self-pay

## 2017-09-24 DIAGNOSIS — M170 Bilateral primary osteoarthritis of knee: Secondary | ICD-10-CM | POA: Diagnosis not present

## 2017-09-24 DIAGNOSIS — D631 Anemia in chronic kidney disease: Secondary | ICD-10-CM | POA: Diagnosis not present

## 2017-09-24 DIAGNOSIS — I428 Other cardiomyopathies: Secondary | ICD-10-CM | POA: Diagnosis not present

## 2017-09-24 DIAGNOSIS — N183 Chronic kidney disease, stage 3 (moderate): Secondary | ICD-10-CM | POA: Diagnosis not present

## 2017-09-24 DIAGNOSIS — I5022 Chronic systolic (congestive) heart failure: Secondary | ICD-10-CM | POA: Diagnosis not present

## 2017-09-24 DIAGNOSIS — Z95810 Presence of automatic (implantable) cardiac defibrillator: Secondary | ICD-10-CM | POA: Diagnosis not present

## 2017-09-24 DIAGNOSIS — I2510 Atherosclerotic heart disease of native coronary artery without angina pectoris: Secondary | ICD-10-CM | POA: Diagnosis not present

## 2017-09-24 DIAGNOSIS — N400 Enlarged prostate without lower urinary tract symptoms: Secondary | ICD-10-CM | POA: Diagnosis not present

## 2017-09-24 DIAGNOSIS — E039 Hypothyroidism, unspecified: Secondary | ICD-10-CM | POA: Diagnosis not present

## 2017-09-24 DIAGNOSIS — I130 Hypertensive heart and chronic kidney disease with heart failure and stage 1 through stage 4 chronic kidney disease, or unspecified chronic kidney disease: Secondary | ICD-10-CM | POA: Diagnosis not present

## 2017-09-24 DIAGNOSIS — I714 Abdominal aortic aneurysm, without rupture: Secondary | ICD-10-CM | POA: Diagnosis not present

## 2017-09-24 DIAGNOSIS — G4733 Obstructive sleep apnea (adult) (pediatric): Secondary | ICD-10-CM | POA: Diagnosis not present

## 2017-09-24 DIAGNOSIS — I447 Left bundle-branch block, unspecified: Secondary | ICD-10-CM | POA: Diagnosis not present

## 2017-09-24 DIAGNOSIS — Z7901 Long term (current) use of anticoagulants: Secondary | ICD-10-CM | POA: Diagnosis not present

## 2017-09-24 DIAGNOSIS — M160 Bilateral primary osteoarthritis of hip: Secondary | ICD-10-CM | POA: Diagnosis not present

## 2017-09-24 DIAGNOSIS — M48061 Spinal stenosis, lumbar region without neurogenic claudication: Secondary | ICD-10-CM | POA: Diagnosis not present

## 2017-09-24 DIAGNOSIS — I481 Persistent atrial fibrillation: Secondary | ICD-10-CM | POA: Diagnosis not present

## 2017-09-24 NOTE — Patient Outreach (Signed)
[facility]) Care Management  09/24/2017  RANALDO BARASCH 09-23-1931 [id number]   Care Coordination   Incoming call from PCP-Dr. Denyse Amass. Patient unable to go directly into rehab facility as he has not had an admission within a year per info provided by [facility] SW. MD inquiring if anything [facility] can do to override and bypass this criteria. Advised that RN CM unfamiliar with anything that [facility] can do to override this. MD states he heard in provider meeting that [facility] may be able to "waiver" this admission rule. RN CM will follow up with [facility] Asst. Director regarding this matter.   RN CM made phone call to [facility] Asst. Director(G. Roena Malady) and discussed and reviewed patient case as well as phone call with MD. Informed by leadership that waiver rule was in regards to HTA patients and has not been approved. Patient has [facility] Medicare. Recommendation from leadership is to have PCP contact [employer] and complete peer to peer review to get approval. Advised that [facility] medical director unable to get involved until this has been completed.   RN CM made return call to PCP and advised of phone call with director. He will follow up and contact [employer].    Antionette Fairy, RN,BSN,CCM [facility] Care Management Telephonic Care Management Coordinator Direct Phone: [phone] Toll Free: [phone] Fax: [phone]

## 2017-09-24 NOTE — Telephone Encounter (Addendum)
I called [facility] and [name] [name] in admitting requested to be faxed the information and she will send to the Nursing Manager to find out if there is space available. She will call back today or first thing in the morning.    [facility]  [phone] [name] [name] [phone] Fax [phone]  Dropped off all records to [place]. Our fax is not working.

## 2017-09-24 NOTE — Telephone Encounter (Signed)
I spoke with Ms [name] [name] this morning.  She is discussing [name]'s case with leadership and will let me know today what our options are.  Please keep the [name] updated.

## 2017-09-25 DIAGNOSIS — E785 Hyperlipidemia, unspecified: Secondary | ICD-10-CM | POA: Diagnosis not present

## 2017-09-25 DIAGNOSIS — D649 Anemia, unspecified: Secondary | ICD-10-CM | POA: Diagnosis not present

## 2017-09-25 DIAGNOSIS — I2510 Atherosclerotic heart disease of native coronary artery without angina pectoris: Secondary | ICD-10-CM | POA: Diagnosis not present

## 2017-09-25 DIAGNOSIS — Z85810 Personal history of malignant neoplasm of tongue: Secondary | ICD-10-CM | POA: Diagnosis not present

## 2017-09-25 DIAGNOSIS — S76111D Strain of right quadriceps muscle, fascia and tendon, subsequent encounter: Secondary | ICD-10-CM | POA: Diagnosis not present

## 2017-09-25 DIAGNOSIS — I5032 Chronic diastolic (congestive) heart failure: Secondary | ICD-10-CM | POA: Diagnosis not present

## 2017-09-25 DIAGNOSIS — E039 Hypothyroidism, unspecified: Secondary | ICD-10-CM | POA: Diagnosis not present

## 2017-09-25 DIAGNOSIS — I447 Left bundle-branch block, unspecified: Secondary | ICD-10-CM | POA: Diagnosis not present

## 2017-09-25 DIAGNOSIS — I429 Cardiomyopathy, unspecified: Secondary | ICD-10-CM | POA: Diagnosis not present

## 2017-09-25 DIAGNOSIS — I4891 Unspecified atrial fibrillation: Secondary | ICD-10-CM | POA: Diagnosis not present

## 2017-09-25 DIAGNOSIS — M6281 Muscle weakness (generalized): Secondary | ICD-10-CM | POA: Diagnosis not present

## 2017-09-25 DIAGNOSIS — S76111A Strain of right quadriceps muscle, fascia and tendon, initial encounter: Secondary | ICD-10-CM | POA: Diagnosis not present

## 2017-09-25 DIAGNOSIS — M48061 Spinal stenosis, lumbar region without neurogenic claudication: Secondary | ICD-10-CM | POA: Diagnosis not present

## 2017-09-25 DIAGNOSIS — R259 Unspecified abnormal involuntary movements: Secondary | ICD-10-CM | POA: Diagnosis not present

## 2017-09-25 DIAGNOSIS — N400 Enlarged prostate without lower urinary tract symptoms: Secondary | ICD-10-CM | POA: Diagnosis not present

## 2017-09-25 DIAGNOSIS — I10 Essential (primary) hypertension: Secondary | ICD-10-CM | POA: Diagnosis not present

## 2017-09-25 DIAGNOSIS — I110 Hypertensive heart disease with heart failure: Secondary | ICD-10-CM | POA: Diagnosis not present

## 2017-09-25 NOTE — Telephone Encounter (Signed)
[name] [name] received an approval for [facility] this morning. [name] [name] can check in today at 11:30. His wife, [name] [name], has been advised.

## 2017-09-25 NOTE — Telephone Encounter (Signed)
Thank you for arranging this.

## 2017-09-27 DIAGNOSIS — S76111D Strain of right quadriceps muscle, fascia and tendon, subsequent encounter: Secondary | ICD-10-CM | POA: Diagnosis not present

## 2017-09-27 DIAGNOSIS — I110 Hypertensive heart disease with heart failure: Secondary | ICD-10-CM | POA: Diagnosis not present

## 2017-10-02 ENCOUNTER — Encounter: Payer: Self-pay | Admitting: Family Medicine

## 2017-10-02 ENCOUNTER — Ambulatory Visit: Payer: Medicare Other | Admitting: Family Medicine

## 2017-10-02 ENCOUNTER — Ambulatory Visit (INDEPENDENT_AMBULATORY_CARE_PROVIDER_SITE_OTHER): Payer: Medicare Other | Admitting: Family Medicine

## 2017-10-02 VITALS — BP 107/72 | HR 71

## 2017-10-02 DIAGNOSIS — S76111A Strain of right quadriceps muscle, fascia and tendon, initial encounter: Secondary | ICD-10-CM | POA: Diagnosis not present

## 2017-10-02 MED ORDER — AMBULATORY NON FORMULARY MEDICATION: 0 refills | Status: DC

## 2017-10-02 MED ORDER — AMBULATORY NON FORMULARY MEDICATION
0 refills | Status: DC
Start: 2017-10-02 — End: 2017-10-04

## 2017-10-02 NOTE — Progress Notes (Signed)
[name] [name] is a 84 y.o. male who presents to [name]: Primary Care Sports Medicine today for right thigh pain.  Finnian has been seen several times in the last 3 weeks for right day pain thought to be quadricep strain.  He was failing conservative management at home and was admitted to a skilled nursing facility for rehab last week.  He notes he is getting a lot better and is here for recheck.  He uses a hinged knee brace locked out to no more than 30 degrees of knee flexion.  He does not think he needs this brace as much and is interested in having less restrictions if possible.  He notes that he is doing a lot better especially with the bed they can raise and lower to help with his transitions and wonders if he can have something like that at home.   Past Medical History:  Diagnosis Date  . Anemia, unspecified 04/07/2013  . Atrial fibrillation (HCC)    fibrillation/flutter  . BPH (benign prostatic hypertrophy)   . Chronic systolic CHF (congestive heart failure) (HCC)    Nonischemic DCM EF 10% s/p BiV AICD  . Coronary artery disease 2008   nonobstructive ASCAD  . Depression   . Hyperlipidemia   . Hypertension   . Hypothyroidism   . Left bundle branch block   . Nonischemic cardiomyopathy (HCC)    ef 10 %  . OSA (obstructive sleep apnea)    intolerant to CPAP  . Osteoarthritis   . Sleep apnea    stopped using CPAP  . Thrombocytopenia (HCC)    Past Surgical History:  Procedure Laterality Date  . CARDIAC DEFIBRILLATOR PLACEMENT     left chest  . CARDIOVERSION N/A 12/29/2012   Procedure: CARDIOVERSION;  Surgeon: Quintella Reichert, MD;  Location: [facility] ENDOSCOPY;  Service: Cardiovascular;  Laterality: N/A;  . CHOLECYSTECTOMY    . Colonscopy     reportedly negative per pt; around 2005.    . EP IMPLANTABLE DEVICE N/A 05/09/2016   Procedure: BIVI ICD Generator Changeout;  Surgeon: Duke Salvia, MD;  Location: [facility] INVASIVE CV LAB;  Service: Cardiovascular;  Laterality: N/A;  . TRANSURETHRAL RESECTION OF PROSTATE     for BPH   Social History   Tobacco Use  . Smoking status: Never Smoker  . Smokeless tobacco: Never Used  Substance Use Topics  . Alcohol use: No   family history includes Cancer in his maternal uncle; Heart disease in his father; Heart failure in his mother; Stroke in his mother.  ROS as above:  Medications: Current Outpatient Medications  Medication Sig Dispense Refill  . acetaminophen (TYLENOL) 650 MG CR tablet Take 1 tablet (650 mg total) by mouth every 8 (eight) hours as needed for pain. 90 tablet 3  . bisoprolol (ZEBETA) 5 MG tablet Take 1 tablet (5 mg total) by mouth daily. 90 tablet 3  . carbidopa-levodopa (SINEMET IR) 25-100 MG tablet TAKE 1 TABLET BY MOUTH 2  TIMES DAILY 180 tablet 1  . diclofenac sodium (VOLTAREN) 1 % GEL Apply 4 g topically 4 (four) times daily. To affected joint. 1440 g 3  . digoxin (DIGOX) 0.125 MG tablet Take 1 tablet (125 mcg total) by mouth every other day. 45 tablet 2  . levothyroxine (SYNTHROID, LEVOTHROID) 137 MCG tablet TAKE 1 TABLET BY MOUTH  DAILY BEFORE BREAKFAST 90 tablet 1  . mupirocin ointment (BACTROBAN) 2 % Apply to affected area BID for 7  days. 30 g 3  . potassium chloride SA (K-DUR,KLOR-CON) 20 MEQ tablet Take 1 tablet (20 mEq total) by mouth daily. 90 tablet 3  . ramipril (ALTACE) 2.5 MG capsule Take 1 capsule (2.5 mg total) by mouth daily. 90 capsule 3  . simvastatin (ZOCOR) 10 MG tablet Take 1 tablet (10 mg total) by mouth every evening. 90 tablet 2  . spironolactone (ALDACTONE) 25 MG tablet Take 0.5 tablets (12.5 mg total) by mouth daily. 45 tablet 2  . torsemide (DEMADEX) 20 MG tablet Take 1 tablet (20 mg total) daily by mouth. 30 tablet 6  . traMADol (ULTRAM) 50 MG tablet 1-2 tabs by mouth Q8 hours, maximum 6 tabs per day. 15 tablet 0  . warfarin (COUMADIN) 1 MG tablet Take 1mg  pill in addition to 5mg  pill  on Monday, Wed, Friday 90 tablet 1  . warfarin (COUMADIN) 5 MG tablet Take 1 tablet (5 mg total) by mouth daily at 6 PM. 30 tablet 0  . AMBULATORY NON FORMULARY MEDICATION I have changed the brace to open.  Continue strength and range of motion exercises.  Advance as tolerated.  Make do PT out of brace with PT or PTA supervision. 1 each 0  . AMBULATORY NON FORMULARY MEDICATION Please provide a bed that raises and lowers for mobility 1 each 0   No current facility-administered medications for this visit.    Allergies  Allergen Reactions  . Amiodarone Other (See Comments)    Neuro toxicity    Health Maintenance Health Maintenance  Topic Date Due  . INFLUENZA VACCINE  01/23/2018  . PNA vac Low Risk Adult (2 of 2 - PCV13) 07/09/2018  . TETANUS/TDAP  07/01/2019     Exam:  BP 107/72   Pulse 71  Gen: Well NAD Right leg no ecchymosis or erythema. Hip flexion and knee extension strength is improved at 4/5.   No results found for this or any previous visit (from the past 72 hour(s)). No results found.    Assessment and Plan: 84 y.o. male with  Improving quadriceps strain.  I have changed the knee brace to allow further range of motion.  He should continue physical therapy exercises.  Goals for discharge are being able to transition on his own without significant assistance.  Anticipate discharge in the near future.  Recheck after discharge.  Otherwise if still in the nursing home next Friday I will have a visit in the nursing home for recheck.  Please send notes from PT and physician notes to my office.   No orders of the defined types were placed in this encounter.  Meds ordered this encounter  Medications  . AMBULATORY NON FORMULARY MEDICATION    Sig: I have changed the brace to open.  Continue strength and range of motion exercises.  Advance as tolerated.  Make do PT out of brace with PT or PTA supervision.    Dispense:  1 each    Refill:  0  . AMBULATORY NON FORMULARY  MEDICATION    Sig: Please provide a bed that raises and lowers for mobility    Dispense:  1 each    Refill:  0     Discussed warning signs or symptoms. Please see discharge instructions. Patient expresses understanding.  CC: [facility] Fax: [phone] and [phone]

## 2017-10-02 NOTE — Patient Instructions (Addendum)
Thank you for coming in today. Recheck soon after discharge.  If nothing changes I will see you next Friday at [facility].   Keep working with PT

## 2017-10-03 ENCOUNTER — Telehealth: Payer: Self-pay

## 2017-10-03 DIAGNOSIS — S76111A Strain of right quadriceps muscle, fascia and tendon, initial encounter: Secondary | ICD-10-CM

## 2017-10-03 DIAGNOSIS — S76111S Strain of right quadriceps muscle, fascia and tendon, sequela: Secondary | ICD-10-CM

## 2017-10-03 NOTE — Telephone Encounter (Signed)
[name] [name] would like a call from Dr Denyse Amass. She was not expecting that he would be discharged this weekend.    Ordered Home Health for [facility]. He will be discharged later this week.

## 2017-10-04 DIAGNOSIS — M48061 Spinal stenosis, lumbar region without neurogenic claudication: Secondary | ICD-10-CM | POA: Diagnosis not present

## 2017-10-04 DIAGNOSIS — I5032 Chronic diastolic (congestive) heart failure: Secondary | ICD-10-CM | POA: Diagnosis not present

## 2017-10-04 DIAGNOSIS — M6281 Muscle weakness (generalized): Secondary | ICD-10-CM | POA: Diagnosis not present

## 2017-10-04 DIAGNOSIS — I2510 Atherosclerotic heart disease of native coronary artery without angina pectoris: Secondary | ICD-10-CM | POA: Diagnosis not present

## 2017-10-04 DIAGNOSIS — S76111D Strain of right quadriceps muscle, fascia and tendon, subsequent encounter: Secondary | ICD-10-CM | POA: Diagnosis not present

## 2017-10-04 MED ORDER — AMBULATORY NON FORMULARY MEDICATION: 0 refills | Status: DC

## 2017-10-07 DIAGNOSIS — I428 Other cardiomyopathies: Secondary | ICD-10-CM | POA: Diagnosis not present

## 2017-10-07 DIAGNOSIS — M160 Bilateral primary osteoarthritis of hip: Secondary | ICD-10-CM | POA: Diagnosis not present

## 2017-10-07 DIAGNOSIS — I2510 Atherosclerotic heart disease of native coronary artery without angina pectoris: Secondary | ICD-10-CM | POA: Diagnosis not present

## 2017-10-07 DIAGNOSIS — M48061 Spinal stenosis, lumbar region without neurogenic claudication: Secondary | ICD-10-CM | POA: Diagnosis not present

## 2017-10-07 DIAGNOSIS — I5022 Chronic systolic (congestive) heart failure: Secondary | ICD-10-CM | POA: Diagnosis not present

## 2017-10-07 DIAGNOSIS — N400 Enlarged prostate without lower urinary tract symptoms: Secondary | ICD-10-CM | POA: Diagnosis not present

## 2017-10-07 DIAGNOSIS — M170 Bilateral primary osteoarthritis of knee: Secondary | ICD-10-CM | POA: Diagnosis not present

## 2017-10-07 DIAGNOSIS — N183 Chronic kidney disease, stage 3 (moderate): Secondary | ICD-10-CM | POA: Diagnosis not present

## 2017-10-07 DIAGNOSIS — E039 Hypothyroidism, unspecified: Secondary | ICD-10-CM | POA: Diagnosis not present

## 2017-10-07 DIAGNOSIS — G4733 Obstructive sleep apnea (adult) (pediatric): Secondary | ICD-10-CM | POA: Diagnosis not present

## 2017-10-07 DIAGNOSIS — I130 Hypertensive heart and chronic kidney disease with heart failure and stage 1 through stage 4 chronic kidney disease, or unspecified chronic kidney disease: Secondary | ICD-10-CM | POA: Diagnosis not present

## 2017-10-07 DIAGNOSIS — I481 Persistent atrial fibrillation: Secondary | ICD-10-CM | POA: Diagnosis not present

## 2017-10-07 DIAGNOSIS — Z7901 Long term (current) use of anticoagulants: Secondary | ICD-10-CM | POA: Diagnosis not present

## 2017-10-07 DIAGNOSIS — I447 Left bundle-branch block, unspecified: Secondary | ICD-10-CM | POA: Diagnosis not present

## 2017-10-07 DIAGNOSIS — I714 Abdominal aortic aneurysm, without rupture: Secondary | ICD-10-CM | POA: Diagnosis not present

## 2017-10-07 DIAGNOSIS — Z95810 Presence of automatic (implantable) cardiac defibrillator: Secondary | ICD-10-CM | POA: Diagnosis not present

## 2017-10-07 DIAGNOSIS — D631 Anemia in chronic kidney disease: Secondary | ICD-10-CM | POA: Diagnosis not present

## 2017-10-08 ENCOUNTER — Telehealth: Payer: Self-pay

## 2017-10-08 ENCOUNTER — Encounter (HOSPITAL_COMMUNITY): Payer: Medicare Other | Admitting: Cardiology

## 2017-10-08 DIAGNOSIS — M48061 Spinal stenosis, lumbar region without neurogenic claudication: Secondary | ICD-10-CM | POA: Diagnosis not present

## 2017-10-08 DIAGNOSIS — E039 Hypothyroidism, unspecified: Secondary | ICD-10-CM | POA: Diagnosis not present

## 2017-10-08 DIAGNOSIS — I714 Abdominal aortic aneurysm, without rupture: Secondary | ICD-10-CM | POA: Diagnosis not present

## 2017-10-08 DIAGNOSIS — I2510 Atherosclerotic heart disease of native coronary artery without angina pectoris: Secondary | ICD-10-CM | POA: Diagnosis not present

## 2017-10-08 DIAGNOSIS — Z95810 Presence of automatic (implantable) cardiac defibrillator: Secondary | ICD-10-CM | POA: Diagnosis not present

## 2017-10-08 DIAGNOSIS — G4733 Obstructive sleep apnea (adult) (pediatric): Secondary | ICD-10-CM | POA: Diagnosis not present

## 2017-10-08 DIAGNOSIS — M170 Bilateral primary osteoarthritis of knee: Secondary | ICD-10-CM | POA: Diagnosis not present

## 2017-10-08 DIAGNOSIS — I130 Hypertensive heart and chronic kidney disease with heart failure and stage 1 through stage 4 chronic kidney disease, or unspecified chronic kidney disease: Secondary | ICD-10-CM | POA: Diagnosis not present

## 2017-10-08 DIAGNOSIS — M160 Bilateral primary osteoarthritis of hip: Secondary | ICD-10-CM | POA: Diagnosis not present

## 2017-10-08 DIAGNOSIS — I447 Left bundle-branch block, unspecified: Secondary | ICD-10-CM | POA: Diagnosis not present

## 2017-10-08 DIAGNOSIS — I481 Persistent atrial fibrillation: Secondary | ICD-10-CM | POA: Diagnosis not present

## 2017-10-08 DIAGNOSIS — I5022 Chronic systolic (congestive) heart failure: Secondary | ICD-10-CM | POA: Diagnosis not present

## 2017-10-08 DIAGNOSIS — N400 Enlarged prostate without lower urinary tract symptoms: Secondary | ICD-10-CM | POA: Diagnosis not present

## 2017-10-08 DIAGNOSIS — D631 Anemia in chronic kidney disease: Secondary | ICD-10-CM | POA: Diagnosis not present

## 2017-10-08 DIAGNOSIS — Z7901 Long term (current) use of anticoagulants: Secondary | ICD-10-CM | POA: Diagnosis not present

## 2017-10-08 DIAGNOSIS — N183 Chronic kidney disease, stage 3 (moderate): Secondary | ICD-10-CM | POA: Diagnosis not present

## 2017-10-08 DIAGNOSIS — I428 Other cardiomyopathies: Secondary | ICD-10-CM | POA: Diagnosis not present

## 2017-10-08 NOTE — Telephone Encounter (Signed)
Verbal ok for PT. No restrictions.

## 2017-10-09 ENCOUNTER — Telehealth: Payer: Self-pay

## 2017-10-09 NOTE — Telephone Encounter (Signed)
Try over-the-counter hydrocortisone cream.  Recheck soon

## 2017-10-09 NOTE — Telephone Encounter (Signed)
[name] [name] called and states [name] [name] has a rash between legs in groin area. She has applied vasaline without improvement. Please advise.

## 2017-10-09 NOTE — Telephone Encounter (Signed)
Patient's wife advised

## 2017-10-10 ENCOUNTER — Telehealth: Payer: Self-pay

## 2017-10-10 DIAGNOSIS — I428 Other cardiomyopathies: Secondary | ICD-10-CM | POA: Diagnosis not present

## 2017-10-10 DIAGNOSIS — D631 Anemia in chronic kidney disease: Secondary | ICD-10-CM | POA: Diagnosis not present

## 2017-10-10 DIAGNOSIS — I447 Left bundle-branch block, unspecified: Secondary | ICD-10-CM | POA: Diagnosis not present

## 2017-10-10 DIAGNOSIS — M160 Bilateral primary osteoarthritis of hip: Secondary | ICD-10-CM | POA: Diagnosis not present

## 2017-10-10 DIAGNOSIS — I5022 Chronic systolic (congestive) heart failure: Secondary | ICD-10-CM | POA: Diagnosis not present

## 2017-10-10 DIAGNOSIS — G4733 Obstructive sleep apnea (adult) (pediatric): Secondary | ICD-10-CM | POA: Diagnosis not present

## 2017-10-10 DIAGNOSIS — I130 Hypertensive heart and chronic kidney disease with heart failure and stage 1 through stage 4 chronic kidney disease, or unspecified chronic kidney disease: Secondary | ICD-10-CM | POA: Diagnosis not present

## 2017-10-10 DIAGNOSIS — I481 Persistent atrial fibrillation: Secondary | ICD-10-CM | POA: Diagnosis not present

## 2017-10-10 DIAGNOSIS — M170 Bilateral primary osteoarthritis of knee: Secondary | ICD-10-CM | POA: Diagnosis not present

## 2017-10-10 DIAGNOSIS — N183 Chronic kidney disease, stage 3 (moderate): Secondary | ICD-10-CM | POA: Diagnosis not present

## 2017-10-10 DIAGNOSIS — I2510 Atherosclerotic heart disease of native coronary artery without angina pectoris: Secondary | ICD-10-CM | POA: Diagnosis not present

## 2017-10-10 DIAGNOSIS — M48061 Spinal stenosis, lumbar region without neurogenic claudication: Secondary | ICD-10-CM | POA: Diagnosis not present

## 2017-10-10 DIAGNOSIS — E039 Hypothyroidism, unspecified: Secondary | ICD-10-CM | POA: Diagnosis not present

## 2017-10-10 MED ORDER — NYSTATIN 100000 UNIT/GM EX CREA: 1.0000 "application " | TOPICAL_CREAM | Freq: Two times a day (BID) | CUTANEOUS | 1 refills | Status: DC

## 2017-10-10 NOTE — Telephone Encounter (Signed)
Riza advised 

## 2017-10-10 NOTE — Telephone Encounter (Signed)
Riza from Home Health called and asked if [name] [name] still needs the leg brace. If so, how much longer. Please advise.

## 2017-10-10 NOTE — Telephone Encounter (Signed)
He can D/C the leg brace per patient desire. If he feels benefit he can continue until his next check with me

## 2017-10-10 NOTE — Telephone Encounter (Signed)
Will rx nystatin cream

## 2017-10-10 NOTE — Telephone Encounter (Signed)
Wife advised. 

## 2017-10-10 NOTE — Telephone Encounter (Signed)
Mickell with Home Health, reports Kaspar has a yeast infection in groin area. Please advise.

## 2017-10-12 ENCOUNTER — Other Ambulatory Visit: Payer: Self-pay | Admitting: Family Medicine

## 2017-10-15 ENCOUNTER — Telehealth: Payer: Self-pay

## 2017-10-15 DIAGNOSIS — I130 Hypertensive heart and chronic kidney disease with heart failure and stage 1 through stage 4 chronic kidney disease, or unspecified chronic kidney disease: Secondary | ICD-10-CM | POA: Diagnosis not present

## 2017-10-15 DIAGNOSIS — D631 Anemia in chronic kidney disease: Secondary | ICD-10-CM | POA: Diagnosis not present

## 2017-10-15 DIAGNOSIS — I2510 Atherosclerotic heart disease of native coronary artery without angina pectoris: Secondary | ICD-10-CM | POA: Diagnosis not present

## 2017-10-15 DIAGNOSIS — G4733 Obstructive sleep apnea (adult) (pediatric): Secondary | ICD-10-CM | POA: Diagnosis not present

## 2017-10-15 DIAGNOSIS — M160 Bilateral primary osteoarthritis of hip: Secondary | ICD-10-CM | POA: Diagnosis not present

## 2017-10-15 DIAGNOSIS — M48061 Spinal stenosis, lumbar region without neurogenic claudication: Secondary | ICD-10-CM | POA: Diagnosis not present

## 2017-10-15 DIAGNOSIS — N183 Chronic kidney disease, stage 3 (moderate): Secondary | ICD-10-CM | POA: Diagnosis not present

## 2017-10-15 DIAGNOSIS — I5022 Chronic systolic (congestive) heart failure: Secondary | ICD-10-CM | POA: Diagnosis not present

## 2017-10-15 DIAGNOSIS — E039 Hypothyroidism, unspecified: Secondary | ICD-10-CM | POA: Diagnosis not present

## 2017-10-15 DIAGNOSIS — M170 Bilateral primary osteoarthritis of knee: Secondary | ICD-10-CM | POA: Diagnosis not present

## 2017-10-15 DIAGNOSIS — I428 Other cardiomyopathies: Secondary | ICD-10-CM | POA: Diagnosis not present

## 2017-10-15 DIAGNOSIS — I447 Left bundle-branch block, unspecified: Secondary | ICD-10-CM | POA: Diagnosis not present

## 2017-10-15 DIAGNOSIS — I481 Persistent atrial fibrillation: Secondary | ICD-10-CM | POA: Diagnosis not present

## 2017-10-15 LAB — POCT INR: INR: 2.4 — AB (ref <=1.1)

## 2017-10-15 NOTE — Telephone Encounter (Signed)
Continue current doses.  Please abstract INR value

## 2017-10-15 NOTE — Telephone Encounter (Signed)
Called Alesha and let her know to continue doses. INR was abstracted.

## 2017-10-16 ENCOUNTER — Ambulatory Visit: Payer: Medicare Other | Admitting: Family Medicine

## 2017-10-17 DIAGNOSIS — I5022 Chronic systolic (congestive) heart failure: Secondary | ICD-10-CM | POA: Diagnosis not present

## 2017-10-17 DIAGNOSIS — M170 Bilateral primary osteoarthritis of knee: Secondary | ICD-10-CM | POA: Diagnosis not present

## 2017-10-17 DIAGNOSIS — I2510 Atherosclerotic heart disease of native coronary artery without angina pectoris: Secondary | ICD-10-CM | POA: Diagnosis not present

## 2017-10-17 DIAGNOSIS — I714 Abdominal aortic aneurysm, without rupture: Secondary | ICD-10-CM | POA: Diagnosis not present

## 2017-10-17 DIAGNOSIS — I481 Persistent atrial fibrillation: Secondary | ICD-10-CM | POA: Diagnosis not present

## 2017-10-17 DIAGNOSIS — G4733 Obstructive sleep apnea (adult) (pediatric): Secondary | ICD-10-CM | POA: Diagnosis not present

## 2017-10-17 DIAGNOSIS — M48061 Spinal stenosis, lumbar region without neurogenic claudication: Secondary | ICD-10-CM | POA: Diagnosis not present

## 2017-10-17 DIAGNOSIS — D631 Anemia in chronic kidney disease: Secondary | ICD-10-CM | POA: Diagnosis not present

## 2017-10-17 DIAGNOSIS — I447 Left bundle-branch block, unspecified: Secondary | ICD-10-CM | POA: Diagnosis not present

## 2017-10-17 DIAGNOSIS — E039 Hypothyroidism, unspecified: Secondary | ICD-10-CM | POA: Diagnosis not present

## 2017-10-17 DIAGNOSIS — Z95810 Presence of automatic (implantable) cardiac defibrillator: Secondary | ICD-10-CM | POA: Diagnosis not present

## 2017-10-17 DIAGNOSIS — Z7901 Long term (current) use of anticoagulants: Secondary | ICD-10-CM | POA: Diagnosis not present

## 2017-10-17 DIAGNOSIS — N183 Chronic kidney disease, stage 3 (moderate): Secondary | ICD-10-CM | POA: Diagnosis not present

## 2017-10-17 DIAGNOSIS — I130 Hypertensive heart and chronic kidney disease with heart failure and stage 1 through stage 4 chronic kidney disease, or unspecified chronic kidney disease: Secondary | ICD-10-CM | POA: Diagnosis not present

## 2017-10-17 DIAGNOSIS — M160 Bilateral primary osteoarthritis of hip: Secondary | ICD-10-CM | POA: Diagnosis not present

## 2017-10-17 DIAGNOSIS — N400 Enlarged prostate without lower urinary tract symptoms: Secondary | ICD-10-CM | POA: Diagnosis not present

## 2017-10-17 DIAGNOSIS — I428 Other cardiomyopathies: Secondary | ICD-10-CM | POA: Diagnosis not present

## 2017-10-18 ENCOUNTER — Encounter: Payer: Self-pay | Admitting: Family Medicine

## 2017-10-18 ENCOUNTER — Ambulatory Visit (INDEPENDENT_AMBULATORY_CARE_PROVIDER_SITE_OTHER): Payer: Medicare Other | Admitting: Family Medicine

## 2017-10-18 ENCOUNTER — Ambulatory Visit: Payer: Medicare Other | Admitting: Family Medicine

## 2017-10-18 VITALS — BP 92/57 | HR 73

## 2017-10-18 DIAGNOSIS — M170 Bilateral primary osteoarthritis of knee: Secondary | ICD-10-CM

## 2017-10-18 DIAGNOSIS — R259 Unspecified abnormal involuntary movements: Secondary | ICD-10-CM

## 2017-10-18 DIAGNOSIS — L539 Erythematous condition, unspecified: Secondary | ICD-10-CM

## 2017-10-18 DIAGNOSIS — B351 Tinea unguium: Secondary | ICD-10-CM | POA: Diagnosis not present

## 2017-10-18 MED ORDER — MUPIROCIN 2 % EX OINT: TOPICAL_OINTMENT | CUTANEOUS | 3 refills | Status: DC

## 2017-10-18 MED ORDER — POTASSIUM CHLORIDE CRYS ER 20 MEQ PO TBCR: 20.0000 meq | EXTENDED_RELEASE_TABLET | Freq: Every day | ORAL | 3 refills | Status: DC

## 2017-10-18 MED ORDER — TORSEMIDE 20 MG PO TABS: 20.0000 mg | ORAL_TABLET | Freq: Every day | ORAL | 3 refills | Status: DC

## 2017-10-18 NOTE — Patient Instructions (Signed)
Thank you for coming in today. I think it is a good idea to see the neurologist again.  You should hear from him soon.  Recheck with me in 1 month.  Return sooner if needed.  Call or go to the ER if you develop a large red swollen joint with extreme pain or oozing puss.

## 2017-10-18 NOTE — Progress Notes (Signed)
[name] [name] is a 84 y.o. male who presents to [name]: Primary Care [facility] Medicine today for follow-up nursing home.  Meng was last seen on April 10.  In the interval he was admitted to skilled nursing for rehab.  Since then he notes that his right quadricep pain has completely resolved.  He continues to experience knee pain left worse than right.  He had a right knee injection about a month ago.  He notes the knee pain is making it difficult to stand up at times and walk.  He notes that his strength has worsened since he was injured a month ago.  He denies any significant tremor.  He uses tramadol for pain since the injury.  His wife notes that this medication is associated with increased risk of falls.  He has not really tried much Tylenol.  Additionally he has some skin redness around his right great toenail.  He notes is not very painful at all.  He is used some leftover mupirocin antibiotic ointment which seems to be helping.  He notes that he has run out of the ointment..   Past Medical History:  Diagnosis Date  . Anemia, unspecified 04/07/2013  . Atrial fibrillation (HCC)    fibrillation/flutter  . BPH (benign prostatic hypertrophy)   . Chronic systolic CHF (congestive heart failure) (HCC)    Nonischemic DCM EF 10% s/p BiV AICD  . Coronary artery disease 2008   nonobstructive ASCAD  . Depression   . Hyperlipidemia   . Hypertension   . Hypothyroidism   . Left bundle branch block   . Nonischemic cardiomyopathy (HCC)    ef 10 %  . OSA (obstructive sleep apnea)    intolerant to CPAP  . Osteoarthritis   . Sleep apnea    stopped using CPAP  . Thrombocytopenia (HCC)    Past Surgical History:  Procedure Laterality Date  . CARDIAC DEFIBRILLATOR PLACEMENT     left chest  . CARDIOVERSION N/A 12/29/2012   Procedure: CARDIOVERSION;  Surgeon: Quintella Reichert, MD;  Location: [facility]  ENDOSCOPY;  Service: Cardiovascular;  Laterality: N/A;  . CHOLECYSTECTOMY    . Colonscopy     reportedly negative per pt; around 2005.    . EP IMPLANTABLE DEVICE N/A 05/09/2016   Procedure: BIVI ICD Generator Changeout;  Surgeon: Duke Salvia, MD;  Location: [facility] INVASIVE CV LAB;  Service: Cardiovascular;  Laterality: N/A;  . TRANSURETHRAL RESECTION OF PROSTATE     for BPH   Social History   Tobacco Use  . Smoking status: Never Smoker  . Smokeless tobacco: Never Used  Substance Use Topics  . Alcohol use: No   family history includes Cancer in his maternal uncle; Heart disease in his father; Heart failure in his mother; Stroke in his mother.  ROS as above:  Medications: Current Outpatient Medications  Medication Sig Dispense Refill  . acetaminophen (TYLENOL) 650 MG CR tablet Take 1 tablet (650 mg total) by mouth every 8 (eight) hours as needed for pain. 90 tablet 3  . AMBULATORY NON FORMULARY MEDICATION Please provide a bed that raises and lowers for mobility 1 each 0  . AMBULATORY NON FORMULARY MEDICATION I have changed the brace to open.  Continue strength and range of motion exercises.  Advance as tolerated.  Make do PT out of brace with PT or PTA supervision. 1 each 0  . bisoprolol (ZEBETA) 5 MG tablet Take 1 tablet (5 mg total) by mouth  daily. 90 tablet 3  . carbidopa-levodopa (SINEMET IR) 25-100 MG tablet TAKE 1 TABLET BY MOUTH 2  TIMES DAILY 180 tablet 1  . diclofenac sodium (VOLTAREN) 1 % GEL Apply 4 g topically 4 (four) times daily. To affected joint. 1440 g 3  . digoxin (DIGOX) 0.125 MG tablet Take 1 tablet (125 mcg total) by mouth every other day. 45 tablet 2  . levothyroxine (SYNTHROID, LEVOTHROID) 137 MCG tablet TAKE 1 TABLET BY MOUTH  DAILY BEFORE BREAKFAST 90 tablet 1  . mupirocin ointment (BACTROBAN) 2 % Apply to affected area BID for 7 days. 30 g 3  . nystatin cream (MYCOSTATIN) Apply 1 application topically 2 (two) times daily. 30 g 1  . potassium chloride SA  (K-DUR,KLOR-CON) 20 MEQ tablet Take 1 tablet (20 mEq total) by mouth daily. 90 tablet 3  . ramipril (ALTACE) 2.5 MG capsule Take 1 capsule (2.5 mg total) by mouth daily. 90 capsule 3  . simvastatin (ZOCOR) 10 MG tablet Take 1 tablet (10 mg total) by mouth every evening. 90 tablet 2  . spironolactone (ALDACTONE) 25 MG tablet Take 0.5 tablets (12.5 mg total) by mouth daily. 45 tablet 2  . torsemide (DEMADEX) 20 MG tablet Take 1 tablet (20 mg total) by mouth daily. 90 tablet 3  . warfarin (COUMADIN) 1 MG tablet Take 1mg  pill in addition to 5mg  pill on Monday, Wed, Friday 90 tablet 1  . warfarin (COUMADIN) 5 MG tablet Take 1 tablet (5 mg total) by mouth daily at 6 PM. 30 tablet 0  . traMADol (ULTRAM) 50 MG tablet 1-2 tabs by mouth Q8 hours, maximum 6 tabs per day. (Patient not taking: Reported on 10/18/2017) 15 tablet 0   No current facility-administered medications for this visit.    Allergies  Allergen Reactions  . Amiodarone Other (See Comments)    Neuro toxicity    Health Maintenance Health Maintenance  Topic Date Due  . INFLUENZA VACCINE  01/23/2018  . PNA vac Low Risk Adult (2 of 2 - PCV13) 07/09/2018  . TETANUS/TDAP  07/01/2019     Exam:  BP (!) 92/57   Pulse 73  Gen: Well NAD HEENT: EOMI,  MMM Lungs: Normal work of breathing. CTABL Heart: RRR no MRG Abd: NABS, Soft. Nondistended, Nontender Exts: Brisk capillary refill, warm and well perfused.  Neuro: Alert and oriented normal coordination.  Facial expression diminished.  No tremor.   Right quadricep normal-appearing nontender  Left knee: Mild effusion no erythema.  Diffusely tender.  Normal range of motion.  Right great toenail thickened with onychomycosis.  Skin strain of the base of the toenail is mildly erythematous and nontender with no induration or expressible pus.  Procedure: Real-time Ultrasound Guided Injection of left knee Device: GE Logiq E   Images permanently stored and available for review in the  ultrasound unit. Verbal informed consent obtained.  Discussed risks and benefits of procedure. Warned about infection bleeding damage to structures skin hypopigmentation and fat atrophy among others. Patient expresses understanding and agreement Time-out conducted.   Noted no overlying erythema, induration, or other signs of local infection.   Skin prepped in a sterile fashion.   Local anesthesia: Topical Ethyl chloride.   With sterile technique and under real time ultrasound guidance:  80 mg of Depo-Medrol and 3 mL of Marcaine injected easily.   Completed without difficulty   Pain immediately resolved suggesting accurate placement of the medication.   Advised to call if fevers/chills, erythema, induration, drainage, or persistent bleeding.  Images permanently stored and available for review in the ultrasound unit.  Impression: Technically successful ultrasound guided injection.      No results found for this or any previous visit (from the past 72 hour(s)). No results found.    Assessment and Plan: 84 y.o. male with  Knee pain: End-stage DJD.  Plan for repeat steroid injection to the left knee.  Continue home physical therapy.  Recheck in 1 month.  Weakness: Multifactorial.  In the past patient was seen by neurology who identified Parkinson's features.  Was started on Sinemet and had excellent response.  I took over care due to transportation difficulties.  Because Hunt is not doing as well as he should at this point I think it is reasonable for reassessment with neurology.   Skin redness.  Likely no infection present.  I think it is reasonable to use mupirocin ointment and recheck in the near future if not doing well.  Patient has been seen by podiatry previously for onychomycosis but notes that transportation is a barrier to his care.    Recheck 1 month.   No orders of the defined types were placed in this encounter.  Meds ordered this encounter  Medications  . potassium  chloride SA (K-DUR,KLOR-CON) 20 MEQ tablet    Sig: Take 1 tablet (20 mEq total) by mouth daily.    Dispense:  90 tablet    Refill:  3  . torsemide (DEMADEX) 20 MG tablet    Sig: Take 1 tablet (20 mg total) by mouth daily.    Dispense:  90 tablet    Refill:  3  . mupirocin ointment (BACTROBAN) 2 %    Sig: Apply to affected area BID for 7 days.    Dispense:  30 g    Refill:  3     Discussed warning signs or symptoms. Please see discharge instructions. Patient expresses understanding.

## 2017-10-21 DIAGNOSIS — I428 Other cardiomyopathies: Secondary | ICD-10-CM | POA: Diagnosis not present

## 2017-10-21 DIAGNOSIS — I447 Left bundle-branch block, unspecified: Secondary | ICD-10-CM | POA: Diagnosis not present

## 2017-10-21 DIAGNOSIS — N183 Chronic kidney disease, stage 3 (moderate): Secondary | ICD-10-CM | POA: Diagnosis not present

## 2017-10-21 DIAGNOSIS — I5022 Chronic systolic (congestive) heart failure: Secondary | ICD-10-CM | POA: Diagnosis not present

## 2017-10-21 DIAGNOSIS — M48061 Spinal stenosis, lumbar region without neurogenic claudication: Secondary | ICD-10-CM | POA: Diagnosis not present

## 2017-10-21 DIAGNOSIS — D631 Anemia in chronic kidney disease: Secondary | ICD-10-CM | POA: Diagnosis not present

## 2017-10-21 DIAGNOSIS — M160 Bilateral primary osteoarthritis of hip: Secondary | ICD-10-CM | POA: Diagnosis not present

## 2017-10-21 DIAGNOSIS — E039 Hypothyroidism, unspecified: Secondary | ICD-10-CM | POA: Diagnosis not present

## 2017-10-21 DIAGNOSIS — I481 Persistent atrial fibrillation: Secondary | ICD-10-CM | POA: Diagnosis not present

## 2017-10-21 DIAGNOSIS — I2510 Atherosclerotic heart disease of native coronary artery without angina pectoris: Secondary | ICD-10-CM | POA: Diagnosis not present

## 2017-10-21 DIAGNOSIS — G4733 Obstructive sleep apnea (adult) (pediatric): Secondary | ICD-10-CM | POA: Diagnosis not present

## 2017-10-21 DIAGNOSIS — I130 Hypertensive heart and chronic kidney disease with heart failure and stage 1 through stage 4 chronic kidney disease, or unspecified chronic kidney disease: Secondary | ICD-10-CM | POA: Diagnosis not present

## 2017-10-21 DIAGNOSIS — M170 Bilateral primary osteoarthritis of knee: Secondary | ICD-10-CM | POA: Diagnosis not present

## 2017-10-22 DIAGNOSIS — I2510 Atherosclerotic heart disease of native coronary artery without angina pectoris: Secondary | ICD-10-CM | POA: Diagnosis not present

## 2017-10-22 DIAGNOSIS — M48061 Spinal stenosis, lumbar region without neurogenic claudication: Secondary | ICD-10-CM | POA: Diagnosis not present

## 2017-10-22 DIAGNOSIS — G4733 Obstructive sleep apnea (adult) (pediatric): Secondary | ICD-10-CM | POA: Diagnosis not present

## 2017-10-22 DIAGNOSIS — I447 Left bundle-branch block, unspecified: Secondary | ICD-10-CM | POA: Diagnosis not present

## 2017-10-22 DIAGNOSIS — N183 Chronic kidney disease, stage 3 (moderate): Secondary | ICD-10-CM | POA: Diagnosis not present

## 2017-10-22 DIAGNOSIS — M170 Bilateral primary osteoarthritis of knee: Secondary | ICD-10-CM | POA: Diagnosis not present

## 2017-10-22 DIAGNOSIS — I5022 Chronic systolic (congestive) heart failure: Secondary | ICD-10-CM | POA: Diagnosis not present

## 2017-10-22 DIAGNOSIS — E039 Hypothyroidism, unspecified: Secondary | ICD-10-CM | POA: Diagnosis not present

## 2017-10-22 DIAGNOSIS — I130 Hypertensive heart and chronic kidney disease with heart failure and stage 1 through stage 4 chronic kidney disease, or unspecified chronic kidney disease: Secondary | ICD-10-CM | POA: Diagnosis not present

## 2017-10-22 DIAGNOSIS — I481 Persistent atrial fibrillation: Secondary | ICD-10-CM | POA: Diagnosis not present

## 2017-10-22 DIAGNOSIS — I428 Other cardiomyopathies: Secondary | ICD-10-CM | POA: Diagnosis not present

## 2017-10-22 DIAGNOSIS — D631 Anemia in chronic kidney disease: Secondary | ICD-10-CM | POA: Diagnosis not present

## 2017-10-22 DIAGNOSIS — M160 Bilateral primary osteoarthritis of hip: Secondary | ICD-10-CM | POA: Diagnosis not present

## 2017-10-23 DIAGNOSIS — G4733 Obstructive sleep apnea (adult) (pediatric): Secondary | ICD-10-CM | POA: Diagnosis not present

## 2017-10-23 DIAGNOSIS — M170 Bilateral primary osteoarthritis of knee: Secondary | ICD-10-CM | POA: Diagnosis not present

## 2017-10-23 DIAGNOSIS — I481 Persistent atrial fibrillation: Secondary | ICD-10-CM | POA: Diagnosis not present

## 2017-10-23 DIAGNOSIS — M160 Bilateral primary osteoarthritis of hip: Secondary | ICD-10-CM | POA: Diagnosis not present

## 2017-10-23 DIAGNOSIS — E039 Hypothyroidism, unspecified: Secondary | ICD-10-CM | POA: Diagnosis not present

## 2017-10-23 DIAGNOSIS — M48061 Spinal stenosis, lumbar region without neurogenic claudication: Secondary | ICD-10-CM | POA: Diagnosis not present

## 2017-10-23 DIAGNOSIS — D631 Anemia in chronic kidney disease: Secondary | ICD-10-CM | POA: Diagnosis not present

## 2017-10-23 DIAGNOSIS — I5022 Chronic systolic (congestive) heart failure: Secondary | ICD-10-CM | POA: Diagnosis not present

## 2017-10-23 DIAGNOSIS — I130 Hypertensive heart and chronic kidney disease with heart failure and stage 1 through stage 4 chronic kidney disease, or unspecified chronic kidney disease: Secondary | ICD-10-CM | POA: Diagnosis not present

## 2017-10-23 DIAGNOSIS — I428 Other cardiomyopathies: Secondary | ICD-10-CM | POA: Diagnosis not present

## 2017-10-23 DIAGNOSIS — N183 Chronic kidney disease, stage 3 (moderate): Secondary | ICD-10-CM | POA: Diagnosis not present

## 2017-10-23 DIAGNOSIS — I2510 Atherosclerotic heart disease of native coronary artery without angina pectoris: Secondary | ICD-10-CM | POA: Diagnosis not present

## 2017-10-23 DIAGNOSIS — I447 Left bundle-branch block, unspecified: Secondary | ICD-10-CM | POA: Diagnosis not present

## 2017-10-25 DIAGNOSIS — I428 Other cardiomyopathies: Secondary | ICD-10-CM | POA: Diagnosis not present

## 2017-10-25 DIAGNOSIS — I481 Persistent atrial fibrillation: Secondary | ICD-10-CM | POA: Diagnosis not present

## 2017-10-25 DIAGNOSIS — D631 Anemia in chronic kidney disease: Secondary | ICD-10-CM | POA: Diagnosis not present

## 2017-10-25 DIAGNOSIS — I130 Hypertensive heart and chronic kidney disease with heart failure and stage 1 through stage 4 chronic kidney disease, or unspecified chronic kidney disease: Secondary | ICD-10-CM | POA: Diagnosis not present

## 2017-10-25 DIAGNOSIS — I2510 Atherosclerotic heart disease of native coronary artery without angina pectoris: Secondary | ICD-10-CM | POA: Diagnosis not present

## 2017-10-25 DIAGNOSIS — G4733 Obstructive sleep apnea (adult) (pediatric): Secondary | ICD-10-CM | POA: Diagnosis not present

## 2017-10-25 DIAGNOSIS — M170 Bilateral primary osteoarthritis of knee: Secondary | ICD-10-CM | POA: Diagnosis not present

## 2017-10-25 DIAGNOSIS — E039 Hypothyroidism, unspecified: Secondary | ICD-10-CM | POA: Diagnosis not present

## 2017-10-25 DIAGNOSIS — I447 Left bundle-branch block, unspecified: Secondary | ICD-10-CM | POA: Diagnosis not present

## 2017-10-25 DIAGNOSIS — M48061 Spinal stenosis, lumbar region without neurogenic claudication: Secondary | ICD-10-CM | POA: Diagnosis not present

## 2017-10-25 DIAGNOSIS — I5022 Chronic systolic (congestive) heart failure: Secondary | ICD-10-CM | POA: Diagnosis not present

## 2017-10-25 DIAGNOSIS — N183 Chronic kidney disease, stage 3 (moderate): Secondary | ICD-10-CM | POA: Diagnosis not present

## 2017-10-25 DIAGNOSIS — M160 Bilateral primary osteoarthritis of hip: Secondary | ICD-10-CM | POA: Diagnosis not present

## 2017-10-28 DIAGNOSIS — I130 Hypertensive heart and chronic kidney disease with heart failure and stage 1 through stage 4 chronic kidney disease, or unspecified chronic kidney disease: Secondary | ICD-10-CM | POA: Diagnosis not present

## 2017-10-28 DIAGNOSIS — M48061 Spinal stenosis, lumbar region without neurogenic claudication: Secondary | ICD-10-CM | POA: Diagnosis not present

## 2017-10-28 DIAGNOSIS — I447 Left bundle-branch block, unspecified: Secondary | ICD-10-CM | POA: Diagnosis not present

## 2017-10-28 DIAGNOSIS — I481 Persistent atrial fibrillation: Secondary | ICD-10-CM | POA: Diagnosis not present

## 2017-10-28 DIAGNOSIS — I2510 Atherosclerotic heart disease of native coronary artery without angina pectoris: Secondary | ICD-10-CM | POA: Diagnosis not present

## 2017-10-28 DIAGNOSIS — I5022 Chronic systolic (congestive) heart failure: Secondary | ICD-10-CM | POA: Diagnosis not present

## 2017-10-28 DIAGNOSIS — N183 Chronic kidney disease, stage 3 (moderate): Secondary | ICD-10-CM | POA: Diagnosis not present

## 2017-10-28 DIAGNOSIS — I428 Other cardiomyopathies: Secondary | ICD-10-CM | POA: Diagnosis not present

## 2017-10-28 DIAGNOSIS — D631 Anemia in chronic kidney disease: Secondary | ICD-10-CM | POA: Diagnosis not present

## 2017-10-28 DIAGNOSIS — M170 Bilateral primary osteoarthritis of knee: Secondary | ICD-10-CM | POA: Diagnosis not present

## 2017-10-28 DIAGNOSIS — G4733 Obstructive sleep apnea (adult) (pediatric): Secondary | ICD-10-CM | POA: Diagnosis not present

## 2017-10-28 DIAGNOSIS — M160 Bilateral primary osteoarthritis of hip: Secondary | ICD-10-CM | POA: Diagnosis not present

## 2017-10-28 DIAGNOSIS — E039 Hypothyroidism, unspecified: Secondary | ICD-10-CM | POA: Diagnosis not present

## 2017-10-30 DIAGNOSIS — E039 Hypothyroidism, unspecified: Secondary | ICD-10-CM | POA: Diagnosis not present

## 2017-10-30 DIAGNOSIS — I2510 Atherosclerotic heart disease of native coronary artery without angina pectoris: Secondary | ICD-10-CM | POA: Diagnosis not present

## 2017-10-30 DIAGNOSIS — I428 Other cardiomyopathies: Secondary | ICD-10-CM | POA: Diagnosis not present

## 2017-10-30 DIAGNOSIS — M160 Bilateral primary osteoarthritis of hip: Secondary | ICD-10-CM | POA: Diagnosis not present

## 2017-10-30 DIAGNOSIS — D631 Anemia in chronic kidney disease: Secondary | ICD-10-CM | POA: Diagnosis not present

## 2017-10-30 DIAGNOSIS — G4733 Obstructive sleep apnea (adult) (pediatric): Secondary | ICD-10-CM | POA: Diagnosis not present

## 2017-10-30 DIAGNOSIS — I447 Left bundle-branch block, unspecified: Secondary | ICD-10-CM | POA: Diagnosis not present

## 2017-10-30 DIAGNOSIS — N183 Chronic kidney disease, stage 3 (moderate): Secondary | ICD-10-CM | POA: Diagnosis not present

## 2017-10-30 DIAGNOSIS — M170 Bilateral primary osteoarthritis of knee: Secondary | ICD-10-CM | POA: Diagnosis not present

## 2017-10-30 DIAGNOSIS — I130 Hypertensive heart and chronic kidney disease with heart failure and stage 1 through stage 4 chronic kidney disease, or unspecified chronic kidney disease: Secondary | ICD-10-CM | POA: Diagnosis not present

## 2017-10-30 DIAGNOSIS — M48061 Spinal stenosis, lumbar region without neurogenic claudication: Secondary | ICD-10-CM | POA: Diagnosis not present

## 2017-10-30 DIAGNOSIS — I481 Persistent atrial fibrillation: Secondary | ICD-10-CM | POA: Diagnosis not present

## 2017-10-30 DIAGNOSIS — I5022 Chronic systolic (congestive) heart failure: Secondary | ICD-10-CM | POA: Diagnosis not present

## 2017-10-31 DIAGNOSIS — N183 Chronic kidney disease, stage 3 (moderate): Secondary | ICD-10-CM | POA: Diagnosis not present

## 2017-10-31 DIAGNOSIS — N400 Enlarged prostate without lower urinary tract symptoms: Secondary | ICD-10-CM | POA: Diagnosis not present

## 2017-10-31 DIAGNOSIS — I130 Hypertensive heart and chronic kidney disease with heart failure and stage 1 through stage 4 chronic kidney disease, or unspecified chronic kidney disease: Secondary | ICD-10-CM | POA: Diagnosis not present

## 2017-10-31 DIAGNOSIS — M170 Bilateral primary osteoarthritis of knee: Secondary | ICD-10-CM | POA: Diagnosis not present

## 2017-10-31 DIAGNOSIS — I481 Persistent atrial fibrillation: Secondary | ICD-10-CM | POA: Diagnosis not present

## 2017-10-31 DIAGNOSIS — Z95810 Presence of automatic (implantable) cardiac defibrillator: Secondary | ICD-10-CM | POA: Diagnosis not present

## 2017-10-31 DIAGNOSIS — I447 Left bundle-branch block, unspecified: Secondary | ICD-10-CM | POA: Diagnosis not present

## 2017-10-31 DIAGNOSIS — I714 Abdominal aortic aneurysm, without rupture: Secondary | ICD-10-CM | POA: Diagnosis not present

## 2017-10-31 DIAGNOSIS — D631 Anemia in chronic kidney disease: Secondary | ICD-10-CM | POA: Diagnosis not present

## 2017-10-31 DIAGNOSIS — Z7901 Long term (current) use of anticoagulants: Secondary | ICD-10-CM | POA: Diagnosis not present

## 2017-10-31 DIAGNOSIS — I428 Other cardiomyopathies: Secondary | ICD-10-CM | POA: Diagnosis not present

## 2017-10-31 DIAGNOSIS — M160 Bilateral primary osteoarthritis of hip: Secondary | ICD-10-CM | POA: Diagnosis not present

## 2017-10-31 DIAGNOSIS — I5022 Chronic systolic (congestive) heart failure: Secondary | ICD-10-CM | POA: Diagnosis not present

## 2017-10-31 DIAGNOSIS — I2510 Atherosclerotic heart disease of native coronary artery without angina pectoris: Secondary | ICD-10-CM | POA: Diagnosis not present

## 2017-10-31 DIAGNOSIS — E039 Hypothyroidism, unspecified: Secondary | ICD-10-CM | POA: Diagnosis not present

## 2017-10-31 DIAGNOSIS — G4733 Obstructive sleep apnea (adult) (pediatric): Secondary | ICD-10-CM | POA: Diagnosis not present

## 2017-10-31 DIAGNOSIS — M48061 Spinal stenosis, lumbar region without neurogenic claudication: Secondary | ICD-10-CM | POA: Diagnosis not present

## 2017-11-01 DIAGNOSIS — Z7901 Long term (current) use of anticoagulants: Secondary | ICD-10-CM | POA: Diagnosis not present

## 2017-11-01 DIAGNOSIS — D631 Anemia in chronic kidney disease: Secondary | ICD-10-CM | POA: Diagnosis not present

## 2017-11-01 DIAGNOSIS — I714 Abdominal aortic aneurysm, without rupture: Secondary | ICD-10-CM | POA: Diagnosis not present

## 2017-11-01 DIAGNOSIS — Z95810 Presence of automatic (implantable) cardiac defibrillator: Secondary | ICD-10-CM | POA: Diagnosis not present

## 2017-11-01 DIAGNOSIS — I447 Left bundle-branch block, unspecified: Secondary | ICD-10-CM | POA: Diagnosis not present

## 2017-11-01 DIAGNOSIS — G4733 Obstructive sleep apnea (adult) (pediatric): Secondary | ICD-10-CM | POA: Diagnosis not present

## 2017-11-01 DIAGNOSIS — M160 Bilateral primary osteoarthritis of hip: Secondary | ICD-10-CM | POA: Diagnosis not present

## 2017-11-01 DIAGNOSIS — I130 Hypertensive heart and chronic kidney disease with heart failure and stage 1 through stage 4 chronic kidney disease, or unspecified chronic kidney disease: Secondary | ICD-10-CM | POA: Diagnosis not present

## 2017-11-01 DIAGNOSIS — M170 Bilateral primary osteoarthritis of knee: Secondary | ICD-10-CM | POA: Diagnosis not present

## 2017-11-01 DIAGNOSIS — M48061 Spinal stenosis, lumbar region without neurogenic claudication: Secondary | ICD-10-CM | POA: Diagnosis not present

## 2017-11-01 DIAGNOSIS — I481 Persistent atrial fibrillation: Secondary | ICD-10-CM | POA: Diagnosis not present

## 2017-11-01 DIAGNOSIS — I2510 Atherosclerotic heart disease of native coronary artery without angina pectoris: Secondary | ICD-10-CM | POA: Diagnosis not present

## 2017-11-01 DIAGNOSIS — N400 Enlarged prostate without lower urinary tract symptoms: Secondary | ICD-10-CM | POA: Diagnosis not present

## 2017-11-01 DIAGNOSIS — I428 Other cardiomyopathies: Secondary | ICD-10-CM | POA: Diagnosis not present

## 2017-11-01 DIAGNOSIS — E039 Hypothyroidism, unspecified: Secondary | ICD-10-CM | POA: Diagnosis not present

## 2017-11-01 DIAGNOSIS — N183 Chronic kidney disease, stage 3 (moderate): Secondary | ICD-10-CM | POA: Diagnosis not present

## 2017-11-01 DIAGNOSIS — I5022 Chronic systolic (congestive) heart failure: Secondary | ICD-10-CM | POA: Diagnosis not present

## 2017-11-03 DIAGNOSIS — S76111D Strain of right quadriceps muscle, fascia and tendon, subsequent encounter: Secondary | ICD-10-CM | POA: Diagnosis not present

## 2017-11-03 DIAGNOSIS — M48061 Spinal stenosis, lumbar region without neurogenic claudication: Secondary | ICD-10-CM | POA: Diagnosis not present

## 2017-11-03 DIAGNOSIS — M6281 Muscle weakness (generalized): Secondary | ICD-10-CM | POA: Diagnosis not present

## 2017-11-03 DIAGNOSIS — I5032 Chronic diastolic (congestive) heart failure: Secondary | ICD-10-CM | POA: Diagnosis not present

## 2017-11-05 DIAGNOSIS — I5022 Chronic systolic (congestive) heart failure: Secondary | ICD-10-CM | POA: Diagnosis not present

## 2017-11-05 DIAGNOSIS — M48061 Spinal stenosis, lumbar region without neurogenic claudication: Secondary | ICD-10-CM | POA: Diagnosis not present

## 2017-11-05 DIAGNOSIS — D631 Anemia in chronic kidney disease: Secondary | ICD-10-CM | POA: Diagnosis not present

## 2017-11-05 DIAGNOSIS — M160 Bilateral primary osteoarthritis of hip: Secondary | ICD-10-CM | POA: Diagnosis not present

## 2017-11-05 DIAGNOSIS — E039 Hypothyroidism, unspecified: Secondary | ICD-10-CM | POA: Diagnosis not present

## 2017-11-05 DIAGNOSIS — Z7901 Long term (current) use of anticoagulants: Secondary | ICD-10-CM | POA: Diagnosis not present

## 2017-11-05 DIAGNOSIS — I481 Persistent atrial fibrillation: Secondary | ICD-10-CM | POA: Diagnosis not present

## 2017-11-05 DIAGNOSIS — I714 Abdominal aortic aneurysm, without rupture: Secondary | ICD-10-CM | POA: Diagnosis not present

## 2017-11-05 DIAGNOSIS — N400 Enlarged prostate without lower urinary tract symptoms: Secondary | ICD-10-CM | POA: Diagnosis not present

## 2017-11-05 DIAGNOSIS — Z95810 Presence of automatic (implantable) cardiac defibrillator: Secondary | ICD-10-CM | POA: Diagnosis not present

## 2017-11-05 DIAGNOSIS — N183 Chronic kidney disease, stage 3 (moderate): Secondary | ICD-10-CM | POA: Diagnosis not present

## 2017-11-05 DIAGNOSIS — G4733 Obstructive sleep apnea (adult) (pediatric): Secondary | ICD-10-CM | POA: Diagnosis not present

## 2017-11-05 DIAGNOSIS — I2510 Atherosclerotic heart disease of native coronary artery without angina pectoris: Secondary | ICD-10-CM | POA: Diagnosis not present

## 2017-11-05 DIAGNOSIS — I447 Left bundle-branch block, unspecified: Secondary | ICD-10-CM | POA: Diagnosis not present

## 2017-11-05 DIAGNOSIS — I428 Other cardiomyopathies: Secondary | ICD-10-CM | POA: Diagnosis not present

## 2017-11-05 DIAGNOSIS — M170 Bilateral primary osteoarthritis of knee: Secondary | ICD-10-CM | POA: Diagnosis not present

## 2017-11-05 DIAGNOSIS — I130 Hypertensive heart and chronic kidney disease with heart failure and stage 1 through stage 4 chronic kidney disease, or unspecified chronic kidney disease: Secondary | ICD-10-CM | POA: Diagnosis not present

## 2017-11-07 ENCOUNTER — Encounter: Payer: Self-pay | Admitting: Internal Medicine

## 2017-11-07 ENCOUNTER — Ambulatory Visit: Payer: Medicare Other | Admitting: Internal Medicine

## 2017-11-07 VITALS — BP 88/60 | HR 71 | Ht 76.0 in | Wt 204.0 lb

## 2017-11-07 DIAGNOSIS — I428 Other cardiomyopathies: Secondary | ICD-10-CM

## 2017-11-07 DIAGNOSIS — I5042 Chronic combined systolic (congestive) and diastolic (congestive) heart failure: Secondary | ICD-10-CM | POA: Diagnosis not present

## 2017-11-07 DIAGNOSIS — Z95810 Presence of automatic (implantable) cardiac defibrillator: Secondary | ICD-10-CM

## 2017-11-07 DIAGNOSIS — I480 Paroxysmal atrial fibrillation: Secondary | ICD-10-CM | POA: Diagnosis not present

## 2017-11-07 NOTE — Progress Notes (Signed)
Patient Care Team: Rodolph Bong, MD as PCP - General (Family Medicine) Quintella Reichert, MD as Consulting Physician (Cardiology)   HPI  [name] [name] is a 84 y.o. male Seen in followup for an ICD implanted for primary prevention in the setting of nonischemic cardiomyopathy identified and confirmed by catheterization in 2008. Because of symptoms of heart failure and left bundle branch block he underwent CRT-D implantation with a Medtronic device and a 6947 defibrillator lead. He had a lazerus-like effect.   He also has a history of atrial fibrillation For this he was treated with amiodarone. Surveillance labs   Normal  But he develped gait instability and med was stopped     Digoxin level 8/15 was 0.3  DATE TEST EF   8/14 TTE   EF 25-30 %   2/16 TTE   EF 35-40 %   8/17 TTE EF 35%   5/18 Echo  35-40%    Date Cr K Hgb  1/19 1.34 4.3 13.4         He is having "weakness in his knees"--and he thinks the problem is knees.  He has fallen a number of times particularly with prolonged standing.  His blood pressure runs low in the 80s-90s.  Recently has had beta-blockers added to his regime.  No significant edema.  Breathing is not been an issue of late.  No orthopnea.  No palpitations.  He is intercurrently been diagnosed with Parkinson's.  Sinemet was initiated with some benefit.  There is now been regression again.     Past Medical History:  Diagnosis Date  . Anemia, unspecified 04/07/2013  . Atrial fibrillation (HCC)    fibrillation/flutter  . BPH (benign prostatic hypertrophy)   . Chronic systolic CHF (congestive heart failure) (HCC)    Nonischemic DCM EF 10% s/p BiV AICD  . Coronary artery disease 2008   nonobstructive ASCAD  . Depression   . Hyperlipidemia   . Hypertension   . Hypothyroidism   . Left bundle branch block   . Nonischemic cardiomyopathy (HCC)    ef 10 %  . OSA (obstructive sleep apnea)    intolerant to CPAP  . Osteoarthritis   . Sleep apnea      stopped using CPAP  . Thrombocytopenia (HCC)     Past Surgical History:  Procedure Laterality Date  . CARDIAC DEFIBRILLATOR PLACEMENT     left chest  . CARDIOVERSION N/A 12/29/2012   Procedure: CARDIOVERSION;  Surgeon: Quintella Reichert, MD;  Location: [facility];  Service: Cardiovascular;  Laterality: N/A;  . CHOLECYSTECTOMY    . Colonscopy     reportedly negative per pt; around 2005.    . EP IMPLANTABLE DEVICE N/A 05/09/2016   Procedure: BIVI ICD Generator Changeout;  Surgeon: Duke Salvia, MD;  Location: [facility] INVASIVE CV LAB;  Service: Cardiovascular;  Laterality: N/A;  . TRANSURETHRAL RESECTION OF PROSTATE     for BPH    Current Outpatient Medications  Medication Sig Dispense Refill  . acetaminophen (TYLENOL) 650 MG CR tablet Take 1 tablet (650 mg total) by mouth every 8 (eight) hours as needed for pain. 90 tablet 3  . AMBULATORY NON FORMULARY MEDICATION Please provide a bed that raises and lowers for mobility 1 each 0  . AMBULATORY NON FORMULARY MEDICATION I have changed the brace to open.  Continue strength and range of motion exercises.  Advance as tolerated.  Make do PT out of brace with PT or PTA supervision. 1  each 0  . bisoprolol (ZEBETA) 5 MG tablet Take 1 tablet (5 mg total) by mouth daily. 90 tablet 3  . carbidopa-levodopa (SINEMET IR) 25-100 MG tablet TAKE 1 TABLET BY MOUTH 2  TIMES DAILY 180 tablet 1  . diclofenac sodium (VOLTAREN) 1 % GEL Apply 4 g topically 4 (four) times daily. To affected joint. 1440 g 3  . digoxin (DIGOX) 0.125 MG tablet Take 1 tablet (125 mcg total) by mouth every other day. 45 tablet 2  . levothyroxine (SYNTHROID, LEVOTHROID) 137 MCG tablet TAKE 1 TABLET BY MOUTH  DAILY BEFORE BREAKFAST 90 tablet 1  . mupirocin ointment (BACTROBAN) 2 % Apply to affected area BID for 7 days. 30 g 3  . nystatin cream (MYCOSTATIN) Apply 1 application topically 2 (two) times daily. 30 g 1  . potassium chloride SA (K-DUR,KLOR-CON) 20 MEQ tablet Take 1 tablet (20  mEq total) by mouth daily. 90 tablet 3  . ramipril (ALTACE) 2.5 MG capsule Take 1 capsule (2.5 mg total) by mouth daily. 90 capsule 3  . simvastatin (ZOCOR) 10 MG tablet Take 1 tablet (10 mg total) by mouth every evening. 90 tablet 2  . spironolactone (ALDACTONE) 25 MG tablet Take 0.5 tablets (12.5 mg total) by mouth daily. 45 tablet 2  . torsemide (DEMADEX) 20 MG tablet Take 1 tablet (20 mg total) by mouth daily. 90 tablet 3  . traMADol (ULTRAM) 50 MG tablet 1-2 tabs by mouth Q8 hours, maximum 6 tabs per day. 15 tablet 0  . warfarin (COUMADIN) 1 MG tablet Take 1mg  pill in addition to 5mg  pill on Monday, Wed, Friday 90 tablet 1  . warfarin (COUMADIN) 5 MG tablet Take 1 tablet (5 mg total) by mouth daily at 6 PM. 30 tablet 0   No current facility-administered medications for this visit.     Allergies  Allergen Reactions  . Amiodarone Other (See Comments)    Neuro toxicity    Review of Systems negative except from HPI and PMH  Physical Exam BP (!) 88/60   Pulse 71   Ht 6\' 4"  (1.93 m)   Wt 204 lb (92.5 kg)   SpO2 98%   BMI 24.83 kg/m  Well developed and nourished in no acute distress HENT normal Neck supple with JVP-flat Clear Regular rate and rhythm, no murmurs or gallops Abd-soft with active BS No Clubbing cyanosis edema Skin-warm and dry A & Oriented gait is wide-based and unstable   ECG ventricular pacing with underlying atrial fibrillation/tachycardia Biventricular configuration with an upright QRS lead V1 negative QRS lead I  Assessment and  Plan  Nonischemic cardiomyopathy   Congestive heart failure-chronic-systolic   Atrial fibrillation-paroxysmal  Atrial tachycardia  Monitoring for high-risk medications-   Orthostatic intolerance  Implantable defibrillator-CRT-Medtronic   I suspect that his leg weakness is related to orthostatic intolerance in the context of his hypotension.  We will discontinue his beta-blocker, his ACE inhibitor and his Aldactone.  I  think the bigger threat to his well-being right now is not long-term issues related to cardiomyopathy but is a fall.  In the event that we can minimize the symptoms, perhaps afterload reduction can be reintroduced.  His atrial fibrillation appears to be paroxysmal with recurrent episodes.  He is in atrial tachycardia today is cycle length of about 400 ms.  Given the device programming he is P synchronous lead pacing with 2-1 AV conduction.  There may be a role for cardioversion and reprogramming; but I am not optimistic that we be able  to maintain sinus rhythm.  Furthermore, atrial fibrillation is likely to be less effective physiology than 2-1 conduction at a rate of a 75 bpm.  More than 50% of 40 min was spent in counseling related to the above

## 2017-11-07 NOTE — Patient Instructions (Addendum)
Medication Instructions:  Your physician has recommended you make the following change in your medication:   1. Stop Bisoprolol (Zebeta) 2. Stop Ramipril (Altace) 3. Stop Spironolactone (Aldactone)  Labwork: None ordered.  Testing/Procedures: None ordered.  Follow-Up: Your physician recommends that you schedule a follow-up appointment in: 3 Months with Dr Graciela Husbands  Remote monitoring is used to monitor your ICD from home. This monitoring reduces the number of office visits required to check your device to one time per year. It allows [place] to keep an eye on the functioning of your device to ensure it is working properly. You are scheduled for a device check from home on 11/20/2017. You may send your transmission at any time that day. If you have a wireless device, the transmission will be sent automatically. After your physician reviews your transmission, you will receive a postcard with your next transmission date.   Any Other Special Instructions Will Be Listed Below (If Applicable).     If you need a refill on your cardiac medications before your next appointment, please call your pharmacy.

## 2017-11-08 ENCOUNTER — Telehealth: Payer: Self-pay | Admitting: Internal Medicine

## 2017-11-08 NOTE — Telephone Encounter (Signed)
New Message   Pt states he would like for his info from his visit yesterday 5/16, forwarded to Dr. Shirlee Latch. Please call

## 2017-11-08 NOTE — Telephone Encounter (Signed)
Spoke with pt. I have forwarded his last OV to Dr Shirlee Latch per pt's request. He had no additional requests or questions.

## 2017-11-11 ENCOUNTER — Encounter: Payer: Self-pay | Admitting: Family Medicine

## 2017-11-11 ENCOUNTER — Ambulatory Visit (INDEPENDENT_AMBULATORY_CARE_PROVIDER_SITE_OTHER): Payer: Medicare Other | Admitting: Family Medicine

## 2017-11-11 VITALS — BP 99/65 | HR 79 | Wt 203.0 lb

## 2017-11-11 DIAGNOSIS — R259 Unspecified abnormal involuntary movements: Secondary | ICD-10-CM

## 2017-11-11 DIAGNOSIS — I428 Other cardiomyopathies: Secondary | ICD-10-CM

## 2017-11-11 DIAGNOSIS — M6281 Muscle weakness (generalized): Secondary | ICD-10-CM

## 2017-11-11 DIAGNOSIS — M170 Bilateral primary osteoarthritis of knee: Secondary | ICD-10-CM | POA: Diagnosis not present

## 2017-11-11 DIAGNOSIS — I4891 Unspecified atrial fibrillation: Secondary | ICD-10-CM | POA: Diagnosis not present

## 2017-11-11 DIAGNOSIS — G20 Parkinson's disease: Secondary | ICD-10-CM | POA: Diagnosis not present

## 2017-11-11 DIAGNOSIS — I509 Heart failure, unspecified: Secondary | ICD-10-CM | POA: Diagnosis not present

## 2017-11-11 MED ORDER — TORSEMIDE 20 MG PO TABS: 20.0000 mg | ORAL_TABLET | Freq: Every day | ORAL | 3 refills | Status: DC

## 2017-11-11 MED ORDER — TORSEMIDE 20 MG PO TABS
20.0000 mg | ORAL_TABLET | Freq: Every day | ORAL | 0 refills | Status: DC
Start: 2017-11-11 — End: 2018-10-22

## 2017-11-11 NOTE — Patient Instructions (Signed)
Thank you for coming in today. Recheck in 1 month.  We may consider a different company than [employer].  Please get details about bathing.  That sounds like personal care services.

## 2017-11-11 NOTE — Progress Notes (Signed)
[name] [name] is a 84 y.o. male who presents to [name] today for follow up.   [name] [name] has been receiving home health physical therapy for bilateral knee pain and leg weakness since early April. The patient reports mild improvement in pain and mobility since then. The patient states that he is not satisfied with his improvement in mobility.  He notes bilateral weakness and difficulty standing at times.  He was seen by his cardiologist recently who is  is concerned that falling is the biggest risk to his health.  Cardiology is concerned for potential orthostatic intolerance and has discontinued several of the blood pressure medications.  [name] [name] states he still has difficulty standing up and ambulating. He cannot be on his feet for much time at all. The physical therapists have two more appointments with him but are stopping due to a plateau in improvement. The patient states he feels unsteady on his feet often.He has no other complaints today.  He notes that he is an appointment with his neurologist today for evaluation for leg weakness as well.  He previously was diagnosed with Parkinson's features and was prescribed Sinemet which helped a lot.  They do note that they are struggling to get home health aids to help with [name] [name]'s bathing.    ROS as above:  Exam:  BP 99/65   Pulse 79   Wt 203 lb (92.1 kg)   BMI 24.71 kg/m  Gen: Well NAD HEENT: EOMI,  MMM Lungs: Normal work of breathing. CTABL Heart: RRR no MRG Abd: NABS, Soft. Nondistended, Nontender Exts: Brisk capillary refill, warm and well perfused.   Neuro: Alert and oriented normal coordination.  Facial expression diminished.  No tremor.   Patient is currently in a wheelchair and has difficulty standing.     Assessment and Plan: 84 y.o. male with weakness.   Leg weakness: Patients weakness is due to  multiple factors including osteoarthritis, Parkinson's, inactivity, and potentially low blood pressure. He has been taken off his blood pressure medications, but remains slightly hypotensive. I am suspicious that Parkinson's features may be one of the larger factors contributing to his weakness and gait problems.  Patient has an appointment with cardiology today.  Plan to follow-up results and likely restart home health physical therapy.  We may use a different company than [employer] as [name] [name]'s family is a bit unhappy with them.  We may readjust blood pressure medications based on results with neurology today.  Recheck in 1 month.   No orders of the defined types were placed in this encounter.  No orders of the defined types were placed in this encounter.    Historical information moved to improve visibility of documentation.  Past Medical History:  Diagnosis Date  . Anemia, unspecified 04/07/2013  . Atrial fibrillation (HCC)    fibrillation/flutter  . BPH (benign prostatic hypertrophy)   . Chronic systolic CHF (congestive heart failure) (HCC)    Nonischemic DCM EF 10% s/p BiV AICD  . Coronary artery disease 2008   nonobstructive ASCAD  . Depression   . Hyperlipidemia   . Hypertension   . Hypothyroidism   . Left bundle branch block   . Nonischemic cardiomyopathy (HCC)    ef 10 %  . OSA (obstructive sleep apnea)    intolerant to CPAP  . Osteoarthritis   . Sleep apnea    stopped using CPAP  . Thrombocytopenia (HCC)    Past Surgical History:  Procedure Laterality Date  . CARDIAC DEFIBRILLATOR PLACEMENT     left chest  . CARDIOVERSION N/A 12/29/2012   Procedure: CARDIOVERSION;  Surgeon: Quintella Reichert, MD;  Location: [facility];  Service: Cardiovascular;  Laterality: N/A;  . CHOLECYSTECTOMY    . Colonscopy     reportedly negative per pt; around 2005.    . EP IMPLANTABLE DEVICE N/A 05/09/2016   Procedure: BIVI ICD Generator Changeout;  Surgeon: Duke Salvia, MD;  Location: [facility]  INVASIVE CV LAB;  Service: Cardiovascular;  Laterality: N/A;  . TRANSURETHRAL RESECTION OF PROSTATE     for BPH   Social History   Tobacco Use  . Smoking status: Never Smoker  . Smokeless tobacco: Never Used  Substance Use Topics  . Alcohol use: No   family history includes Cancer in his maternal uncle; Heart disease in his father; Heart failure in his mother; Stroke in his mother.  Medications: Current Outpatient Medications  Medication Sig Dispense Refill  . acetaminophen (TYLENOL) 650 MG CR tablet Take 1 tablet (650 mg total) by mouth every 8 (eight) hours as needed for pain. 90 tablet 3  . AMBULATORY NON FORMULARY MEDICATION Please provide a bed that raises and lowers for mobility 1 each 0  . AMBULATORY NON FORMULARY MEDICATION I have changed the brace to open.  Continue strength and range of motion exercises.  Advance as tolerated.  Make do PT out of brace with PT or PTA supervision. 1 each 0  . carbidopa-levodopa (SINEMET IR) 25-100 MG tablet TAKE 1 TABLET BY MOUTH 2  TIMES DAILY 180 tablet 1  . diclofenac sodium (VOLTAREN) 1 % GEL Apply 4 g topically 4 (four) times daily. To affected joint. 1440 g 3  . digoxin (DIGOX) 0.125 MG tablet Take 1 tablet (125 mcg total) by mouth every other day. 45 tablet 2  . levothyroxine (SYNTHROID, LEVOTHROID) 137 MCG tablet TAKE 1 TABLET BY MOUTH  DAILY BEFORE BREAKFAST 90 tablet 1  . mupirocin ointment (BACTROBAN) 2 % Apply to affected area BID for 7 days. 30 g 3  . nystatin cream (MYCOSTATIN) Apply 1 application topically 2 (two) times daily. 30 g 1  . potassium chloride SA (K-DUR,KLOR-CON) 20 MEQ tablet Take 1 tablet (20 mEq total) by mouth daily. 90 tablet 3  . simvastatin (ZOCOR) 10 MG tablet Take 1 tablet (10 mg total) by mouth every evening. 90 tablet 2  . torsemide (DEMADEX) 20 MG tablet Take 1 tablet (20 mg total) by mouth daily. 90 tablet 3  . traMADol (ULTRAM) 50 MG tablet 1-2 tabs by mouth Q8 hours, maximum 6 tabs per day. 15 tablet 0   . warfarin (COUMADIN) 1 MG tablet Take 1mg  pill in addition to 5mg  pill on Monday, Wed, Friday 90 tablet 1  . warfarin (COUMADIN) 5 MG tablet Take 1 tablet (5 mg total) by mouth daily at 6 PM. 30 tablet 0   No current facility-administered medications for this visit.    Allergies  Allergen Reactions  . Amiodarone Other (See Comments)    Neuro toxicity    Health Maintenance Health Maintenance  Topic Date Due  . INFLUENZA VACCINE  01/23/2018  . PNA vac Low Risk Adult (2 of 2 - PCV13) 07/09/2018  . TETANUS/TDAP  07/01/2019    Discussed warning signs or symptoms. Please see discharge instructions. Patient expresses understanding.

## 2017-11-12 DIAGNOSIS — E039 Hypothyroidism, unspecified: Secondary | ICD-10-CM | POA: Diagnosis not present

## 2017-11-12 DIAGNOSIS — M170 Bilateral primary osteoarthritis of knee: Secondary | ICD-10-CM | POA: Diagnosis not present

## 2017-11-12 DIAGNOSIS — D631 Anemia in chronic kidney disease: Secondary | ICD-10-CM | POA: Diagnosis not present

## 2017-11-12 DIAGNOSIS — I2510 Atherosclerotic heart disease of native coronary artery without angina pectoris: Secondary | ICD-10-CM | POA: Diagnosis not present

## 2017-11-12 DIAGNOSIS — I481 Persistent atrial fibrillation: Secondary | ICD-10-CM | POA: Diagnosis not present

## 2017-11-12 DIAGNOSIS — M160 Bilateral primary osteoarthritis of hip: Secondary | ICD-10-CM | POA: Diagnosis not present

## 2017-11-12 DIAGNOSIS — I5022 Chronic systolic (congestive) heart failure: Secondary | ICD-10-CM | POA: Diagnosis not present

## 2017-11-12 DIAGNOSIS — G4733 Obstructive sleep apnea (adult) (pediatric): Secondary | ICD-10-CM | POA: Diagnosis not present

## 2017-11-12 DIAGNOSIS — Z7901 Long term (current) use of anticoagulants: Secondary | ICD-10-CM | POA: Diagnosis not present

## 2017-11-12 DIAGNOSIS — M48061 Spinal stenosis, lumbar region without neurogenic claudication: Secondary | ICD-10-CM | POA: Diagnosis not present

## 2017-11-12 DIAGNOSIS — N400 Enlarged prostate without lower urinary tract symptoms: Secondary | ICD-10-CM | POA: Diagnosis not present

## 2017-11-12 DIAGNOSIS — I714 Abdominal aortic aneurysm, without rupture: Secondary | ICD-10-CM | POA: Diagnosis not present

## 2017-11-12 DIAGNOSIS — N183 Chronic kidney disease, stage 3 (moderate): Secondary | ICD-10-CM | POA: Diagnosis not present

## 2017-11-12 DIAGNOSIS — I428 Other cardiomyopathies: Secondary | ICD-10-CM | POA: Diagnosis not present

## 2017-11-12 DIAGNOSIS — I130 Hypertensive heart and chronic kidney disease with heart failure and stage 1 through stage 4 chronic kidney disease, or unspecified chronic kidney disease: Secondary | ICD-10-CM | POA: Diagnosis not present

## 2017-11-12 DIAGNOSIS — Z95810 Presence of automatic (implantable) cardiac defibrillator: Secondary | ICD-10-CM | POA: Diagnosis not present

## 2017-11-12 DIAGNOSIS — I447 Left bundle-branch block, unspecified: Secondary | ICD-10-CM | POA: Diagnosis not present

## 2017-11-12 LAB — CUP PACEART INCLINIC DEVICE CHECK
Implantable Lead Location: 753858
Implantable Lead Location: 753860
Implantable Lead Model: 4076
Implantable Lead Model: 6947
Implantable Pulse Generator Implant Date: 20171115
MDC IDC LEAD IMPLANT DT: 20080806
MDC IDC LEAD IMPLANT DT: 20080806
MDC IDC LEAD IMPLANT DT: 20080806
MDC IDC LEAD LOCATION: 753859
MDC IDC SESS DTM: 20190521114115

## 2017-11-13 DIAGNOSIS — M160 Bilateral primary osteoarthritis of hip: Secondary | ICD-10-CM | POA: Diagnosis not present

## 2017-11-13 DIAGNOSIS — I714 Abdominal aortic aneurysm, without rupture: Secondary | ICD-10-CM | POA: Diagnosis not present

## 2017-11-13 DIAGNOSIS — M48061 Spinal stenosis, lumbar region without neurogenic claudication: Secondary | ICD-10-CM | POA: Diagnosis not present

## 2017-11-13 DIAGNOSIS — I447 Left bundle-branch block, unspecified: Secondary | ICD-10-CM | POA: Diagnosis not present

## 2017-11-13 DIAGNOSIS — E039 Hypothyroidism, unspecified: Secondary | ICD-10-CM | POA: Diagnosis not present

## 2017-11-13 DIAGNOSIS — Z7901 Long term (current) use of anticoagulants: Secondary | ICD-10-CM | POA: Diagnosis not present

## 2017-11-13 DIAGNOSIS — I130 Hypertensive heart and chronic kidney disease with heart failure and stage 1 through stage 4 chronic kidney disease, or unspecified chronic kidney disease: Secondary | ICD-10-CM | POA: Diagnosis not present

## 2017-11-13 DIAGNOSIS — M170 Bilateral primary osteoarthritis of knee: Secondary | ICD-10-CM | POA: Diagnosis not present

## 2017-11-13 DIAGNOSIS — D631 Anemia in chronic kidney disease: Secondary | ICD-10-CM | POA: Diagnosis not present

## 2017-11-13 DIAGNOSIS — N183 Chronic kidney disease, stage 3 (moderate): Secondary | ICD-10-CM | POA: Diagnosis not present

## 2017-11-13 DIAGNOSIS — N400 Enlarged prostate without lower urinary tract symptoms: Secondary | ICD-10-CM | POA: Diagnosis not present

## 2017-11-13 DIAGNOSIS — I2510 Atherosclerotic heart disease of native coronary artery without angina pectoris: Secondary | ICD-10-CM | POA: Diagnosis not present

## 2017-11-13 DIAGNOSIS — I481 Persistent atrial fibrillation: Secondary | ICD-10-CM | POA: Diagnosis not present

## 2017-11-13 DIAGNOSIS — I428 Other cardiomyopathies: Secondary | ICD-10-CM | POA: Diagnosis not present

## 2017-11-13 DIAGNOSIS — G4733 Obstructive sleep apnea (adult) (pediatric): Secondary | ICD-10-CM | POA: Diagnosis not present

## 2017-11-13 DIAGNOSIS — Z95810 Presence of automatic (implantable) cardiac defibrillator: Secondary | ICD-10-CM | POA: Diagnosis not present

## 2017-11-13 DIAGNOSIS — I5022 Chronic systolic (congestive) heart failure: Secondary | ICD-10-CM | POA: Diagnosis not present

## 2017-11-14 DIAGNOSIS — N183 Chronic kidney disease, stage 3 (moderate): Secondary | ICD-10-CM | POA: Diagnosis not present

## 2017-11-14 DIAGNOSIS — M160 Bilateral primary osteoarthritis of hip: Secondary | ICD-10-CM | POA: Diagnosis not present

## 2017-11-14 DIAGNOSIS — N400 Enlarged prostate without lower urinary tract symptoms: Secondary | ICD-10-CM | POA: Diagnosis not present

## 2017-11-14 DIAGNOSIS — M48061 Spinal stenosis, lumbar region without neurogenic claudication: Secondary | ICD-10-CM | POA: Diagnosis not present

## 2017-11-14 DIAGNOSIS — I2510 Atherosclerotic heart disease of native coronary artery without angina pectoris: Secondary | ICD-10-CM | POA: Diagnosis not present

## 2017-11-14 DIAGNOSIS — Z7901 Long term (current) use of anticoagulants: Secondary | ICD-10-CM | POA: Diagnosis not present

## 2017-11-14 DIAGNOSIS — Z95810 Presence of automatic (implantable) cardiac defibrillator: Secondary | ICD-10-CM | POA: Diagnosis not present

## 2017-11-14 DIAGNOSIS — D631 Anemia in chronic kidney disease: Secondary | ICD-10-CM | POA: Diagnosis not present

## 2017-11-14 DIAGNOSIS — M170 Bilateral primary osteoarthritis of knee: Secondary | ICD-10-CM | POA: Diagnosis not present

## 2017-11-14 DIAGNOSIS — I130 Hypertensive heart and chronic kidney disease with heart failure and stage 1 through stage 4 chronic kidney disease, or unspecified chronic kidney disease: Secondary | ICD-10-CM | POA: Diagnosis not present

## 2017-11-14 DIAGNOSIS — I481 Persistent atrial fibrillation: Secondary | ICD-10-CM | POA: Diagnosis not present

## 2017-11-14 DIAGNOSIS — G4733 Obstructive sleep apnea (adult) (pediatric): Secondary | ICD-10-CM | POA: Diagnosis not present

## 2017-11-14 DIAGNOSIS — E039 Hypothyroidism, unspecified: Secondary | ICD-10-CM | POA: Diagnosis not present

## 2017-11-14 DIAGNOSIS — I447 Left bundle-branch block, unspecified: Secondary | ICD-10-CM | POA: Diagnosis not present

## 2017-11-14 DIAGNOSIS — I428 Other cardiomyopathies: Secondary | ICD-10-CM | POA: Diagnosis not present

## 2017-11-14 DIAGNOSIS — I714 Abdominal aortic aneurysm, without rupture: Secondary | ICD-10-CM | POA: Diagnosis not present

## 2017-11-14 DIAGNOSIS — I5022 Chronic systolic (congestive) heart failure: Secondary | ICD-10-CM | POA: Diagnosis not present

## 2017-11-19 DIAGNOSIS — I2510 Atherosclerotic heart disease of native coronary artery without angina pectoris: Secondary | ICD-10-CM | POA: Diagnosis not present

## 2017-11-19 DIAGNOSIS — I130 Hypertensive heart and chronic kidney disease with heart failure and stage 1 through stage 4 chronic kidney disease, or unspecified chronic kidney disease: Secondary | ICD-10-CM | POA: Diagnosis not present

## 2017-11-19 DIAGNOSIS — N183 Chronic kidney disease, stage 3 (moderate): Secondary | ICD-10-CM | POA: Diagnosis not present

## 2017-11-19 DIAGNOSIS — S76111D Strain of right quadriceps muscle, fascia and tendon, subsequent encounter: Secondary | ICD-10-CM | POA: Diagnosis not present

## 2017-11-19 DIAGNOSIS — M170 Bilateral primary osteoarthritis of knee: Secondary | ICD-10-CM | POA: Diagnosis not present

## 2017-11-19 DIAGNOSIS — G4733 Obstructive sleep apnea (adult) (pediatric): Secondary | ICD-10-CM | POA: Diagnosis not present

## 2017-11-19 DIAGNOSIS — I5042 Chronic combined systolic (congestive) and diastolic (congestive) heart failure: Secondary | ICD-10-CM | POA: Diagnosis not present

## 2017-11-19 DIAGNOSIS — M160 Bilateral primary osteoarthritis of hip: Secondary | ICD-10-CM | POA: Diagnosis not present

## 2017-11-19 DIAGNOSIS — D631 Anemia in chronic kidney disease: Secondary | ICD-10-CM | POA: Diagnosis not present

## 2017-11-19 DIAGNOSIS — I481 Persistent atrial fibrillation: Secondary | ICD-10-CM | POA: Diagnosis not present

## 2017-11-19 DIAGNOSIS — I428 Other cardiomyopathies: Secondary | ICD-10-CM | POA: Diagnosis not present

## 2017-11-19 DIAGNOSIS — M48061 Spinal stenosis, lumbar region without neurogenic claudication: Secondary | ICD-10-CM | POA: Diagnosis not present

## 2017-11-20 ENCOUNTER — Ambulatory Visit (INDEPENDENT_AMBULATORY_CARE_PROVIDER_SITE_OTHER): Payer: Medicare Other | Admitting: *Deleted

## 2017-11-20 DIAGNOSIS — I428 Other cardiomyopathies: Secondary | ICD-10-CM

## 2017-11-20 DIAGNOSIS — I5022 Chronic systolic (congestive) heart failure: Secondary | ICD-10-CM

## 2017-11-20 NOTE — Progress Notes (Signed)
Remote ICD transmission.   

## 2017-11-21 ENCOUNTER — Telehealth: Payer: Self-pay | Admitting: Family Medicine

## 2017-11-21 DIAGNOSIS — M48061 Spinal stenosis, lumbar region without neurogenic claudication: Secondary | ICD-10-CM | POA: Diagnosis not present

## 2017-11-21 DIAGNOSIS — M170 Bilateral primary osteoarthritis of knee: Secondary | ICD-10-CM

## 2017-11-21 DIAGNOSIS — D631 Anemia in chronic kidney disease: Secondary | ICD-10-CM | POA: Diagnosis not present

## 2017-11-21 DIAGNOSIS — Z9181 History of falling: Secondary | ICD-10-CM

## 2017-11-21 DIAGNOSIS — M6281 Muscle weakness (generalized): Secondary | ICD-10-CM

## 2017-11-21 DIAGNOSIS — G4733 Obstructive sleep apnea (adult) (pediatric): Secondary | ICD-10-CM | POA: Diagnosis not present

## 2017-11-21 DIAGNOSIS — R259 Unspecified abnormal involuntary movements: Secondary | ICD-10-CM

## 2017-11-21 DIAGNOSIS — I5042 Chronic combined systolic (congestive) and diastolic (congestive) heart failure: Secondary | ICD-10-CM | POA: Diagnosis not present

## 2017-11-21 DIAGNOSIS — N183 Chronic kidney disease, stage 3 (moderate): Secondary | ICD-10-CM | POA: Diagnosis not present

## 2017-11-21 DIAGNOSIS — I130 Hypertensive heart and chronic kidney disease with heart failure and stage 1 through stage 4 chronic kidney disease, or unspecified chronic kidney disease: Secondary | ICD-10-CM | POA: Diagnosis not present

## 2017-11-21 DIAGNOSIS — I2510 Atherosclerotic heart disease of native coronary artery without angina pectoris: Secondary | ICD-10-CM | POA: Diagnosis not present

## 2017-11-21 DIAGNOSIS — I481 Persistent atrial fibrillation: Secondary | ICD-10-CM | POA: Diagnosis not present

## 2017-11-21 DIAGNOSIS — S76111A Strain of right quadriceps muscle, fascia and tendon, initial encounter: Secondary | ICD-10-CM

## 2017-11-21 DIAGNOSIS — I428 Other cardiomyopathies: Secondary | ICD-10-CM | POA: Diagnosis not present

## 2017-11-21 DIAGNOSIS — S76111D Strain of right quadriceps muscle, fascia and tendon, subsequent encounter: Secondary | ICD-10-CM | POA: Diagnosis not present

## 2017-11-21 DIAGNOSIS — M160 Bilateral primary osteoarthritis of hip: Secondary | ICD-10-CM | POA: Diagnosis not present

## 2017-11-21 NOTE — Telephone Encounter (Signed)
Please see note below. [name] [name],CMA  

## 2017-11-21 NOTE — Telephone Encounter (Signed)
Mrs. Lebar called. She said her husband reached his  plateau for the amount of visits he can get from [facility] and needs to get the agency changed because of this. She said a nurse was sent out but he still hasn't had a bath?

## 2017-11-21 NOTE — Telephone Encounter (Signed)
New referral placed.

## 2017-11-22 DIAGNOSIS — N183 Chronic kidney disease, stage 3 (moderate): Secondary | ICD-10-CM | POA: Diagnosis not present

## 2017-11-22 DIAGNOSIS — I5023 Acute on chronic systolic (congestive) heart failure: Secondary | ICD-10-CM | POA: Diagnosis not present

## 2017-11-22 DIAGNOSIS — D631 Anemia in chronic kidney disease: Secondary | ICD-10-CM | POA: Diagnosis not present

## 2017-11-22 DIAGNOSIS — I420 Dilated cardiomyopathy: Secondary | ICD-10-CM | POA: Diagnosis not present

## 2017-11-22 DIAGNOSIS — I447 Left bundle-branch block, unspecified: Secondary | ICD-10-CM | POA: Diagnosis not present

## 2017-11-22 DIAGNOSIS — M4807 Spinal stenosis, lumbosacral region: Secondary | ICD-10-CM | POA: Diagnosis not present

## 2017-11-22 DIAGNOSIS — M1612 Unilateral primary osteoarthritis, left hip: Secondary | ICD-10-CM | POA: Diagnosis not present

## 2017-11-22 DIAGNOSIS — I130 Hypertensive heart and chronic kidney disease with heart failure and stage 1 through stage 4 chronic kidney disease, or unspecified chronic kidney disease: Secondary | ICD-10-CM | POA: Diagnosis not present

## 2017-11-22 DIAGNOSIS — I4891 Unspecified atrial fibrillation: Secondary | ICD-10-CM | POA: Diagnosis not present

## 2017-11-22 DIAGNOSIS — I25810 Atherosclerosis of coronary artery bypass graft(s) without angina pectoris: Secondary | ICD-10-CM | POA: Diagnosis not present

## 2017-11-22 DIAGNOSIS — I714 Abdominal aortic aneurysm, without rupture: Secondary | ICD-10-CM | POA: Diagnosis not present

## 2017-11-22 DIAGNOSIS — M170 Bilateral primary osteoarthritis of knee: Secondary | ICD-10-CM | POA: Diagnosis not present

## 2017-11-22 LAB — CUP PACEART REMOTE DEVICE CHECK
Brady Statistic AP VP Percent: 44.34 %
Brady Statistic AS VP Percent: 50.73 %
Brady Statistic AS VS Percent: 4.62 %
Brady Statistic RV Percent Paced: 92.99 %
HighPow Impedance: 44 Ohm
HighPow Impedance: 57 Ohm
Implantable Lead Implant Date: 20080806
Implantable Lead Location: 753858
Implantable Lead Location: 753859
Implantable Lead Model: 6947
Lead Channel Impedance Value: 1121 Ohm
Lead Channel Impedance Value: 399 Ohm
Lead Channel Impedance Value: 456 Ohm
Lead Channel Impedance Value: 570 Ohm
Lead Channel Impedance Value: 722 Ohm
Lead Channel Sensing Intrinsic Amplitude: 0.5 mV
Lead Channel Sensing Intrinsic Amplitude: 0.5 mV
Lead Channel Sensing Intrinsic Amplitude: 7.625 mV
Lead Channel Setting Pacing Amplitude: 2 V
Lead Channel Setting Pacing Amplitude: 2.5 V
Lead Channel Setting Pacing Pulse Width: 0.4 ms
MDC IDC LEAD IMPLANT DT: 20080806
MDC IDC LEAD IMPLANT DT: 20080806
MDC IDC LEAD LOCATION: 753860
MDC IDC MSMT BATTERY REMAINING LONGEVITY: 76 mo
MDC IDC MSMT BATTERY VOLTAGE: 2.98 V
MDC IDC MSMT LEADCHNL RV IMPEDANCE VALUE: 380 Ohm
MDC IDC MSMT LEADCHNL RV PACING THRESHOLD AMPLITUDE: 0.875 V
MDC IDC MSMT LEADCHNL RV PACING THRESHOLD PULSEWIDTH: 0.4 ms
MDC IDC MSMT LEADCHNL RV SENSING INTR AMPL: 7.625 mV
MDC IDC PG IMPLANT DT: 20171115
MDC IDC SESS DTM: 20190529062823
MDC IDC SET LEADCHNL LV PACING PULSEWIDTH: 0.4 ms
MDC IDC SET LEADCHNL RV PACING AMPLITUDE: 2 V
MDC IDC SET LEADCHNL RV SENSING SENSITIVITY: 0.3 mV
MDC IDC STAT BRADY AP VS PERCENT: 0.31 %
MDC IDC STAT BRADY RA PERCENT PACED: 35.61 %

## 2017-11-22 NOTE — Telephone Encounter (Signed)
Patient's wife would like [facility].

## 2017-11-25 ENCOUNTER — Other Ambulatory Visit: Payer: Self-pay | Admitting: Cardiology

## 2017-11-25 ENCOUNTER — Other Ambulatory Visit: Payer: Self-pay | Admitting: Family Medicine

## 2017-11-25 ENCOUNTER — Other Ambulatory Visit: Payer: Self-pay | Admitting: Internal Medicine

## 2017-11-25 DIAGNOSIS — I447 Left bundle-branch block, unspecified: Secondary | ICD-10-CM | POA: Diagnosis not present

## 2017-11-25 DIAGNOSIS — I25810 Atherosclerosis of coronary artery bypass graft(s) without angina pectoris: Secondary | ICD-10-CM | POA: Diagnosis not present

## 2017-11-25 DIAGNOSIS — I420 Dilated cardiomyopathy: Secondary | ICD-10-CM | POA: Diagnosis not present

## 2017-11-25 DIAGNOSIS — M170 Bilateral primary osteoarthritis of knee: Secondary | ICD-10-CM | POA: Diagnosis not present

## 2017-11-25 DIAGNOSIS — N183 Chronic kidney disease, stage 3 (moderate): Secondary | ICD-10-CM | POA: Diagnosis not present

## 2017-11-25 DIAGNOSIS — M4807 Spinal stenosis, lumbosacral region: Secondary | ICD-10-CM | POA: Diagnosis not present

## 2017-11-25 DIAGNOSIS — I5023 Acute on chronic systolic (congestive) heart failure: Secondary | ICD-10-CM | POA: Diagnosis not present

## 2017-11-25 DIAGNOSIS — M1612 Unilateral primary osteoarthritis, left hip: Secondary | ICD-10-CM | POA: Diagnosis not present

## 2017-11-25 DIAGNOSIS — I4891 Unspecified atrial fibrillation: Secondary | ICD-10-CM | POA: Diagnosis not present

## 2017-11-25 DIAGNOSIS — D631 Anemia in chronic kidney disease: Secondary | ICD-10-CM | POA: Diagnosis not present

## 2017-11-25 DIAGNOSIS — I714 Abdominal aortic aneurysm, without rupture: Secondary | ICD-10-CM | POA: Diagnosis not present

## 2017-11-25 DIAGNOSIS — E039 Hypothyroidism, unspecified: Secondary | ICD-10-CM

## 2017-11-25 DIAGNOSIS — I130 Hypertensive heart and chronic kidney disease with heart failure and stage 1 through stage 4 chronic kidney disease, or unspecified chronic kidney disease: Secondary | ICD-10-CM | POA: Diagnosis not present

## 2017-11-25 NOTE — Telephone Encounter (Signed)
Lafonda Mosses with [facility] will start the PT today.

## 2017-11-26 ENCOUNTER — Telehealth: Payer: Self-pay | Admitting: Family Medicine

## 2017-11-26 DIAGNOSIS — G4733 Obstructive sleep apnea (adult) (pediatric): Secondary | ICD-10-CM

## 2017-11-26 MED ORDER — AMBULATORY NON FORMULARY MEDICATION: 0 refills | Status: DC

## 2017-11-26 NOTE — Telephone Encounter (Signed)
After discussion with [facility] yesterday. Will order new CPAP as his machine is old and not working. Will try to use [facility]. Sleep study information provided.

## 2017-11-26 NOTE — Telephone Encounter (Signed)
Will fax to [facility]. Rhonda Cunningham,CMA

## 2017-11-27 ENCOUNTER — Ambulatory Visit: Payer: Medicare Other | Admitting: Family Medicine

## 2017-11-27 ENCOUNTER — Encounter: Payer: Self-pay | Admitting: Family Medicine

## 2017-11-27 DIAGNOSIS — I420 Dilated cardiomyopathy: Secondary | ICD-10-CM | POA: Diagnosis not present

## 2017-11-27 DIAGNOSIS — I4891 Unspecified atrial fibrillation: Secondary | ICD-10-CM | POA: Diagnosis not present

## 2017-11-27 DIAGNOSIS — I5023 Acute on chronic systolic (congestive) heart failure: Secondary | ICD-10-CM | POA: Diagnosis not present

## 2017-11-27 DIAGNOSIS — I447 Left bundle-branch block, unspecified: Secondary | ICD-10-CM | POA: Diagnosis not present

## 2017-11-27 DIAGNOSIS — I25810 Atherosclerosis of coronary artery bypass graft(s) without angina pectoris: Secondary | ICD-10-CM | POA: Diagnosis not present

## 2017-11-27 DIAGNOSIS — M1612 Unilateral primary osteoarthritis, left hip: Secondary | ICD-10-CM | POA: Diagnosis not present

## 2017-11-27 DIAGNOSIS — M170 Bilateral primary osteoarthritis of knee: Secondary | ICD-10-CM | POA: Diagnosis not present

## 2017-11-27 DIAGNOSIS — N183 Chronic kidney disease, stage 3 (moderate): Secondary | ICD-10-CM | POA: Diagnosis not present

## 2017-11-27 DIAGNOSIS — D631 Anemia in chronic kidney disease: Secondary | ICD-10-CM | POA: Diagnosis not present

## 2017-11-27 DIAGNOSIS — I130 Hypertensive heart and chronic kidney disease with heart failure and stage 1 through stage 4 chronic kidney disease, or unspecified chronic kidney disease: Secondary | ICD-10-CM | POA: Diagnosis not present

## 2017-11-27 DIAGNOSIS — M4807 Spinal stenosis, lumbosacral region: Secondary | ICD-10-CM | POA: Diagnosis not present

## 2017-11-27 DIAGNOSIS — I714 Abdominal aortic aneurysm, without rupture: Secondary | ICD-10-CM | POA: Diagnosis not present

## 2017-11-27 NOTE — Progress Notes (Signed)
Records received from [facility] neurology.  Plan to increase Sinemet to 3x daily.

## 2017-11-28 ENCOUNTER — Telehealth: Payer: Self-pay

## 2017-11-28 ENCOUNTER — Ambulatory Visit: Payer: Medicare Other | Admitting: Family Medicine

## 2017-11-28 DIAGNOSIS — N183 Chronic kidney disease, stage 3 (moderate): Secondary | ICD-10-CM | POA: Diagnosis not present

## 2017-11-28 DIAGNOSIS — M1612 Unilateral primary osteoarthritis, left hip: Secondary | ICD-10-CM | POA: Diagnosis not present

## 2017-11-28 DIAGNOSIS — I5023 Acute on chronic systolic (congestive) heart failure: Secondary | ICD-10-CM | POA: Diagnosis not present

## 2017-11-28 DIAGNOSIS — D631 Anemia in chronic kidney disease: Secondary | ICD-10-CM | POA: Diagnosis not present

## 2017-11-28 DIAGNOSIS — I130 Hypertensive heart and chronic kidney disease with heart failure and stage 1 through stage 4 chronic kidney disease, or unspecified chronic kidney disease: Secondary | ICD-10-CM | POA: Diagnosis not present

## 2017-11-28 DIAGNOSIS — I25810 Atherosclerosis of coronary artery bypass graft(s) without angina pectoris: Secondary | ICD-10-CM | POA: Diagnosis not present

## 2017-11-28 DIAGNOSIS — I4891 Unspecified atrial fibrillation: Secondary | ICD-10-CM | POA: Diagnosis not present

## 2017-11-28 DIAGNOSIS — I714 Abdominal aortic aneurysm, without rupture: Secondary | ICD-10-CM | POA: Diagnosis not present

## 2017-11-28 DIAGNOSIS — I447 Left bundle-branch block, unspecified: Secondary | ICD-10-CM | POA: Diagnosis not present

## 2017-11-28 DIAGNOSIS — M4807 Spinal stenosis, lumbosacral region: Secondary | ICD-10-CM | POA: Diagnosis not present

## 2017-11-28 DIAGNOSIS — M170 Bilateral primary osteoarthritis of knee: Secondary | ICD-10-CM | POA: Diagnosis not present

## 2017-11-28 DIAGNOSIS — I420 Dilated cardiomyopathy: Secondary | ICD-10-CM | POA: Diagnosis not present

## 2017-11-28 MED ORDER — NYSTATIN POWD: 11 refills | Status: DC

## 2017-11-28 NOTE — Telephone Encounter (Signed)
Pt's wife, [name] [name],  called stating that pt has an ongoing rash on his groin. RX was sent for 30g of Nystatin cream in April and [name] [name] states it did work some but it was not enough.   [name] [name] was advised at a home visit that she should request MD to send in greater quantity of Nystatin for pt to use, and to also request for powder instead of cream.   Dr Denyse Amass, can this be done for pt?   Please advise and route back so that I can call pt and update her.   Thanks!

## 2017-11-28 NOTE — Telephone Encounter (Signed)
Nystatin powder sent to [facility].

## 2017-11-28 NOTE — Telephone Encounter (Signed)
Thank you!!  Pt's wife has been advised

## 2017-11-29 DIAGNOSIS — M170 Bilateral primary osteoarthritis of knee: Secondary | ICD-10-CM | POA: Diagnosis not present

## 2017-11-29 DIAGNOSIS — I4891 Unspecified atrial fibrillation: Secondary | ICD-10-CM | POA: Diagnosis not present

## 2017-11-29 DIAGNOSIS — D631 Anemia in chronic kidney disease: Secondary | ICD-10-CM | POA: Diagnosis not present

## 2017-11-29 DIAGNOSIS — I714 Abdominal aortic aneurysm, without rupture: Secondary | ICD-10-CM | POA: Diagnosis not present

## 2017-11-29 DIAGNOSIS — I130 Hypertensive heart and chronic kidney disease with heart failure and stage 1 through stage 4 chronic kidney disease, or unspecified chronic kidney disease: Secondary | ICD-10-CM | POA: Diagnosis not present

## 2017-11-29 DIAGNOSIS — M4807 Spinal stenosis, lumbosacral region: Secondary | ICD-10-CM | POA: Diagnosis not present

## 2017-11-29 DIAGNOSIS — I5023 Acute on chronic systolic (congestive) heart failure: Secondary | ICD-10-CM | POA: Diagnosis not present

## 2017-11-29 DIAGNOSIS — M1612 Unilateral primary osteoarthritis, left hip: Secondary | ICD-10-CM | POA: Diagnosis not present

## 2017-11-29 DIAGNOSIS — I420 Dilated cardiomyopathy: Secondary | ICD-10-CM | POA: Diagnosis not present

## 2017-11-29 DIAGNOSIS — N183 Chronic kidney disease, stage 3 (moderate): Secondary | ICD-10-CM | POA: Diagnosis not present

## 2017-11-29 DIAGNOSIS — I25810 Atherosclerosis of coronary artery bypass graft(s) without angina pectoris: Secondary | ICD-10-CM | POA: Diagnosis not present

## 2017-11-29 DIAGNOSIS — I447 Left bundle-branch block, unspecified: Secondary | ICD-10-CM | POA: Diagnosis not present

## 2017-12-02 ENCOUNTER — Encounter: Payer: Medicare Other | Admitting: Family Medicine

## 2017-12-02 DIAGNOSIS — M1612 Unilateral primary osteoarthritis, left hip: Secondary | ICD-10-CM | POA: Diagnosis not present

## 2017-12-02 DIAGNOSIS — I5023 Acute on chronic systolic (congestive) heart failure: Secondary | ICD-10-CM | POA: Diagnosis not present

## 2017-12-02 DIAGNOSIS — I420 Dilated cardiomyopathy: Secondary | ICD-10-CM | POA: Diagnosis not present

## 2017-12-02 DIAGNOSIS — I447 Left bundle-branch block, unspecified: Secondary | ICD-10-CM | POA: Diagnosis not present

## 2017-12-02 DIAGNOSIS — I25810 Atherosclerosis of coronary artery bypass graft(s) without angina pectoris: Secondary | ICD-10-CM | POA: Diagnosis not present

## 2017-12-02 DIAGNOSIS — D631 Anemia in chronic kidney disease: Secondary | ICD-10-CM | POA: Diagnosis not present

## 2017-12-02 DIAGNOSIS — I714 Abdominal aortic aneurysm, without rupture: Secondary | ICD-10-CM | POA: Diagnosis not present

## 2017-12-02 DIAGNOSIS — I4891 Unspecified atrial fibrillation: Secondary | ICD-10-CM | POA: Diagnosis not present

## 2017-12-02 DIAGNOSIS — M170 Bilateral primary osteoarthritis of knee: Secondary | ICD-10-CM | POA: Diagnosis not present

## 2017-12-02 DIAGNOSIS — N183 Chronic kidney disease, stage 3 (moderate): Secondary | ICD-10-CM | POA: Diagnosis not present

## 2017-12-02 DIAGNOSIS — I130 Hypertensive heart and chronic kidney disease with heart failure and stage 1 through stage 4 chronic kidney disease, or unspecified chronic kidney disease: Secondary | ICD-10-CM | POA: Diagnosis not present

## 2017-12-02 DIAGNOSIS — M4807 Spinal stenosis, lumbosacral region: Secondary | ICD-10-CM | POA: Diagnosis not present

## 2017-12-02 NOTE — Telephone Encounter (Signed)
Pt called to check on the status of new CPAP machine. Left VM for [facility] (they are not open yet). Requested callback.

## 2017-12-03 ENCOUNTER — Telehealth: Payer: Self-pay

## 2017-12-03 DIAGNOSIS — I420 Dilated cardiomyopathy: Secondary | ICD-10-CM | POA: Diagnosis not present

## 2017-12-03 DIAGNOSIS — D631 Anemia in chronic kidney disease: Secondary | ICD-10-CM | POA: Diagnosis not present

## 2017-12-03 DIAGNOSIS — I714 Abdominal aortic aneurysm, without rupture: Secondary | ICD-10-CM | POA: Diagnosis not present

## 2017-12-03 DIAGNOSIS — I447 Left bundle-branch block, unspecified: Secondary | ICD-10-CM | POA: Diagnosis not present

## 2017-12-03 DIAGNOSIS — M4807 Spinal stenosis, lumbosacral region: Secondary | ICD-10-CM | POA: Diagnosis not present

## 2017-12-03 DIAGNOSIS — I4891 Unspecified atrial fibrillation: Secondary | ICD-10-CM | POA: Diagnosis not present

## 2017-12-03 DIAGNOSIS — M170 Bilateral primary osteoarthritis of knee: Secondary | ICD-10-CM | POA: Diagnosis not present

## 2017-12-03 DIAGNOSIS — M1612 Unilateral primary osteoarthritis, left hip: Secondary | ICD-10-CM | POA: Diagnosis not present

## 2017-12-03 DIAGNOSIS — N183 Chronic kidney disease, stage 3 (moderate): Secondary | ICD-10-CM | POA: Diagnosis not present

## 2017-12-03 DIAGNOSIS — I25810 Atherosclerosis of coronary artery bypass graft(s) without angina pectoris: Secondary | ICD-10-CM | POA: Diagnosis not present

## 2017-12-03 DIAGNOSIS — I130 Hypertensive heart and chronic kidney disease with heart failure and stage 1 through stage 4 chronic kidney disease, or unspecified chronic kidney disease: Secondary | ICD-10-CM | POA: Diagnosis not present

## 2017-12-03 DIAGNOSIS — I5023 Acute on chronic systolic (congestive) heart failure: Secondary | ICD-10-CM | POA: Diagnosis not present

## 2017-12-03 MED ORDER — AMBULATORY NON FORMULARY MEDICATION: 0 refills | Status: DC

## 2017-12-03 NOTE — Telephone Encounter (Signed)
Order faxed to [facility]. Ryman Rathgeber,CMA

## 2017-12-04 DIAGNOSIS — I420 Dilated cardiomyopathy: Secondary | ICD-10-CM | POA: Diagnosis not present

## 2017-12-04 DIAGNOSIS — I25810 Atherosclerosis of coronary artery bypass graft(s) without angina pectoris: Secondary | ICD-10-CM | POA: Diagnosis not present

## 2017-12-04 DIAGNOSIS — S76111D Strain of right quadriceps muscle, fascia and tendon, subsequent encounter: Secondary | ICD-10-CM | POA: Diagnosis not present

## 2017-12-04 DIAGNOSIS — M48061 Spinal stenosis, lumbar region without neurogenic claudication: Secondary | ICD-10-CM | POA: Diagnosis not present

## 2017-12-04 DIAGNOSIS — I4891 Unspecified atrial fibrillation: Secondary | ICD-10-CM | POA: Diagnosis not present

## 2017-12-04 DIAGNOSIS — I130 Hypertensive heart and chronic kidney disease with heart failure and stage 1 through stage 4 chronic kidney disease, or unspecified chronic kidney disease: Secondary | ICD-10-CM | POA: Diagnosis not present

## 2017-12-04 DIAGNOSIS — N183 Chronic kidney disease, stage 3 (moderate): Secondary | ICD-10-CM | POA: Diagnosis not present

## 2017-12-04 DIAGNOSIS — M170 Bilateral primary osteoarthritis of knee: Secondary | ICD-10-CM | POA: Diagnosis not present

## 2017-12-04 DIAGNOSIS — D631 Anemia in chronic kidney disease: Secondary | ICD-10-CM | POA: Diagnosis not present

## 2017-12-04 DIAGNOSIS — I447 Left bundle-branch block, unspecified: Secondary | ICD-10-CM | POA: Diagnosis not present

## 2017-12-04 DIAGNOSIS — I5032 Chronic diastolic (congestive) heart failure: Secondary | ICD-10-CM | POA: Diagnosis not present

## 2017-12-04 DIAGNOSIS — M6281 Muscle weakness (generalized): Secondary | ICD-10-CM | POA: Diagnosis not present

## 2017-12-04 DIAGNOSIS — I5023 Acute on chronic systolic (congestive) heart failure: Secondary | ICD-10-CM | POA: Diagnosis not present

## 2017-12-04 DIAGNOSIS — M4807 Spinal stenosis, lumbosacral region: Secondary | ICD-10-CM | POA: Diagnosis not present

## 2017-12-04 DIAGNOSIS — M1612 Unilateral primary osteoarthritis, left hip: Secondary | ICD-10-CM | POA: Diagnosis not present

## 2017-12-04 DIAGNOSIS — I714 Abdominal aortic aneurysm, without rupture: Secondary | ICD-10-CM | POA: Diagnosis not present

## 2017-12-05 DIAGNOSIS — M1612 Unilateral primary osteoarthritis, left hip: Secondary | ICD-10-CM | POA: Diagnosis not present

## 2017-12-05 DIAGNOSIS — I25810 Atherosclerosis of coronary artery bypass graft(s) without angina pectoris: Secondary | ICD-10-CM | POA: Diagnosis not present

## 2017-12-05 DIAGNOSIS — M170 Bilateral primary osteoarthritis of knee: Secondary | ICD-10-CM | POA: Diagnosis not present

## 2017-12-05 DIAGNOSIS — M4807 Spinal stenosis, lumbosacral region: Secondary | ICD-10-CM | POA: Diagnosis not present

## 2017-12-05 DIAGNOSIS — I5023 Acute on chronic systolic (congestive) heart failure: Secondary | ICD-10-CM | POA: Diagnosis not present

## 2017-12-05 DIAGNOSIS — N183 Chronic kidney disease, stage 3 (moderate): Secondary | ICD-10-CM | POA: Diagnosis not present

## 2017-12-05 DIAGNOSIS — I714 Abdominal aortic aneurysm, without rupture: Secondary | ICD-10-CM | POA: Diagnosis not present

## 2017-12-05 DIAGNOSIS — I447 Left bundle-branch block, unspecified: Secondary | ICD-10-CM | POA: Diagnosis not present

## 2017-12-05 DIAGNOSIS — I130 Hypertensive heart and chronic kidney disease with heart failure and stage 1 through stage 4 chronic kidney disease, or unspecified chronic kidney disease: Secondary | ICD-10-CM | POA: Diagnosis not present

## 2017-12-05 DIAGNOSIS — I420 Dilated cardiomyopathy: Secondary | ICD-10-CM | POA: Diagnosis not present

## 2017-12-05 DIAGNOSIS — I4891 Unspecified atrial fibrillation: Secondary | ICD-10-CM | POA: Diagnosis not present

## 2017-12-05 DIAGNOSIS — D631 Anemia in chronic kidney disease: Secondary | ICD-10-CM | POA: Diagnosis not present

## 2017-12-09 ENCOUNTER — Encounter (HOSPITAL_COMMUNITY): Payer: Medicare Other | Admitting: Cardiology

## 2017-12-09 ENCOUNTER — Telehealth: Payer: Self-pay

## 2017-12-09 DIAGNOSIS — N183 Chronic kidney disease, stage 3 (moderate): Secondary | ICD-10-CM | POA: Diagnosis not present

## 2017-12-09 DIAGNOSIS — M1612 Unilateral primary osteoarthritis, left hip: Secondary | ICD-10-CM | POA: Diagnosis not present

## 2017-12-09 DIAGNOSIS — I447 Left bundle-branch block, unspecified: Secondary | ICD-10-CM | POA: Diagnosis not present

## 2017-12-09 DIAGNOSIS — I130 Hypertensive heart and chronic kidney disease with heart failure and stage 1 through stage 4 chronic kidney disease, or unspecified chronic kidney disease: Secondary | ICD-10-CM | POA: Diagnosis not present

## 2017-12-09 DIAGNOSIS — I4891 Unspecified atrial fibrillation: Secondary | ICD-10-CM | POA: Diagnosis not present

## 2017-12-09 DIAGNOSIS — D631 Anemia in chronic kidney disease: Secondary | ICD-10-CM | POA: Diagnosis not present

## 2017-12-09 DIAGNOSIS — I714 Abdominal aortic aneurysm, without rupture: Secondary | ICD-10-CM | POA: Diagnosis not present

## 2017-12-09 DIAGNOSIS — I5023 Acute on chronic systolic (congestive) heart failure: Secondary | ICD-10-CM | POA: Diagnosis not present

## 2017-12-09 DIAGNOSIS — I420 Dilated cardiomyopathy: Secondary | ICD-10-CM | POA: Diagnosis not present

## 2017-12-09 DIAGNOSIS — I25810 Atherosclerosis of coronary artery bypass graft(s) without angina pectoris: Secondary | ICD-10-CM | POA: Diagnosis not present

## 2017-12-09 DIAGNOSIS — M4807 Spinal stenosis, lumbosacral region: Secondary | ICD-10-CM | POA: Diagnosis not present

## 2017-12-09 DIAGNOSIS — M170 Bilateral primary osteoarthritis of knee: Secondary | ICD-10-CM | POA: Diagnosis not present

## 2017-12-09 NOTE — Telephone Encounter (Signed)
[facility] advised. 

## 2017-12-09 NOTE — Telephone Encounter (Signed)
Potassium, Spironolactone and Demadex have an interaction of hyperkalemia when taken together, per [facility]. Please advise.

## 2017-12-09 NOTE — Telephone Encounter (Signed)
These medications are chronic and we are monitoring potassium. OK to continue

## 2017-12-09 NOTE — Telephone Encounter (Signed)
Patient will be out of the medication a few days because he is out and they are just now processing the prescription.

## 2017-12-10 DIAGNOSIS — I130 Hypertensive heart and chronic kidney disease with heart failure and stage 1 through stage 4 chronic kidney disease, or unspecified chronic kidney disease: Secondary | ICD-10-CM | POA: Diagnosis not present

## 2017-12-10 DIAGNOSIS — I25810 Atherosclerosis of coronary artery bypass graft(s) without angina pectoris: Secondary | ICD-10-CM | POA: Diagnosis not present

## 2017-12-10 DIAGNOSIS — I5023 Acute on chronic systolic (congestive) heart failure: Secondary | ICD-10-CM | POA: Diagnosis not present

## 2017-12-10 DIAGNOSIS — M4807 Spinal stenosis, lumbosacral region: Secondary | ICD-10-CM | POA: Diagnosis not present

## 2017-12-10 DIAGNOSIS — M1612 Unilateral primary osteoarthritis, left hip: Secondary | ICD-10-CM | POA: Diagnosis not present

## 2017-12-10 DIAGNOSIS — I4891 Unspecified atrial fibrillation: Secondary | ICD-10-CM | POA: Diagnosis not present

## 2017-12-10 DIAGNOSIS — M170 Bilateral primary osteoarthritis of knee: Secondary | ICD-10-CM | POA: Diagnosis not present

## 2017-12-10 DIAGNOSIS — D631 Anemia in chronic kidney disease: Secondary | ICD-10-CM | POA: Diagnosis not present

## 2017-12-10 DIAGNOSIS — I714 Abdominal aortic aneurysm, without rupture: Secondary | ICD-10-CM | POA: Diagnosis not present

## 2017-12-10 DIAGNOSIS — N183 Chronic kidney disease, stage 3 (moderate): Secondary | ICD-10-CM | POA: Diagnosis not present

## 2017-12-10 DIAGNOSIS — I420 Dilated cardiomyopathy: Secondary | ICD-10-CM | POA: Diagnosis not present

## 2017-12-10 DIAGNOSIS — I447 Left bundle-branch block, unspecified: Secondary | ICD-10-CM | POA: Diagnosis not present

## 2017-12-11 ENCOUNTER — Encounter (HOSPITAL_COMMUNITY): Payer: Medicare Other | Admitting: Cardiology

## 2017-12-11 ENCOUNTER — Ambulatory Visit: Payer: Medicare Other | Admitting: Cardiology

## 2017-12-11 DIAGNOSIS — I714 Abdominal aortic aneurysm, without rupture: Secondary | ICD-10-CM | POA: Diagnosis not present

## 2017-12-11 DIAGNOSIS — I5023 Acute on chronic systolic (congestive) heart failure: Secondary | ICD-10-CM | POA: Diagnosis not present

## 2017-12-11 DIAGNOSIS — D631 Anemia in chronic kidney disease: Secondary | ICD-10-CM | POA: Diagnosis not present

## 2017-12-11 DIAGNOSIS — N183 Chronic kidney disease, stage 3 (moderate): Secondary | ICD-10-CM | POA: Diagnosis not present

## 2017-12-11 DIAGNOSIS — I447 Left bundle-branch block, unspecified: Secondary | ICD-10-CM | POA: Diagnosis not present

## 2017-12-11 DIAGNOSIS — I4891 Unspecified atrial fibrillation: Secondary | ICD-10-CM | POA: Diagnosis not present

## 2017-12-11 DIAGNOSIS — I25810 Atherosclerosis of coronary artery bypass graft(s) without angina pectoris: Secondary | ICD-10-CM | POA: Diagnosis not present

## 2017-12-11 DIAGNOSIS — M4807 Spinal stenosis, lumbosacral region: Secondary | ICD-10-CM | POA: Diagnosis not present

## 2017-12-11 DIAGNOSIS — M170 Bilateral primary osteoarthritis of knee: Secondary | ICD-10-CM | POA: Diagnosis not present

## 2017-12-11 DIAGNOSIS — I130 Hypertensive heart and chronic kidney disease with heart failure and stage 1 through stage 4 chronic kidney disease, or unspecified chronic kidney disease: Secondary | ICD-10-CM | POA: Diagnosis not present

## 2017-12-11 DIAGNOSIS — I420 Dilated cardiomyopathy: Secondary | ICD-10-CM | POA: Diagnosis not present

## 2017-12-11 DIAGNOSIS — M1612 Unilateral primary osteoarthritis, left hip: Secondary | ICD-10-CM | POA: Diagnosis not present

## 2017-12-12 DIAGNOSIS — I447 Left bundle-branch block, unspecified: Secondary | ICD-10-CM | POA: Diagnosis not present

## 2017-12-12 DIAGNOSIS — M4807 Spinal stenosis, lumbosacral region: Secondary | ICD-10-CM | POA: Diagnosis not present

## 2017-12-12 DIAGNOSIS — I25810 Atherosclerosis of coronary artery bypass graft(s) without angina pectoris: Secondary | ICD-10-CM | POA: Diagnosis not present

## 2017-12-12 DIAGNOSIS — I714 Abdominal aortic aneurysm, without rupture: Secondary | ICD-10-CM | POA: Diagnosis not present

## 2017-12-12 DIAGNOSIS — M170 Bilateral primary osteoarthritis of knee: Secondary | ICD-10-CM | POA: Diagnosis not present

## 2017-12-12 DIAGNOSIS — I5023 Acute on chronic systolic (congestive) heart failure: Secondary | ICD-10-CM | POA: Diagnosis not present

## 2017-12-12 DIAGNOSIS — I4891 Unspecified atrial fibrillation: Secondary | ICD-10-CM | POA: Diagnosis not present

## 2017-12-12 DIAGNOSIS — I420 Dilated cardiomyopathy: Secondary | ICD-10-CM | POA: Diagnosis not present

## 2017-12-12 DIAGNOSIS — M1612 Unilateral primary osteoarthritis, left hip: Secondary | ICD-10-CM | POA: Diagnosis not present

## 2017-12-12 DIAGNOSIS — N183 Chronic kidney disease, stage 3 (moderate): Secondary | ICD-10-CM | POA: Diagnosis not present

## 2017-12-12 DIAGNOSIS — D631 Anemia in chronic kidney disease: Secondary | ICD-10-CM | POA: Diagnosis not present

## 2017-12-12 DIAGNOSIS — I130 Hypertensive heart and chronic kidney disease with heart failure and stage 1 through stage 4 chronic kidney disease, or unspecified chronic kidney disease: Secondary | ICD-10-CM | POA: Diagnosis not present

## 2017-12-16 DIAGNOSIS — I5023 Acute on chronic systolic (congestive) heart failure: Secondary | ICD-10-CM | POA: Diagnosis not present

## 2017-12-16 DIAGNOSIS — I25810 Atherosclerosis of coronary artery bypass graft(s) without angina pectoris: Secondary | ICD-10-CM | POA: Diagnosis not present

## 2017-12-16 DIAGNOSIS — I4891 Unspecified atrial fibrillation: Secondary | ICD-10-CM | POA: Diagnosis not present

## 2017-12-16 DIAGNOSIS — I130 Hypertensive heart and chronic kidney disease with heart failure and stage 1 through stage 4 chronic kidney disease, or unspecified chronic kidney disease: Secondary | ICD-10-CM | POA: Diagnosis not present

## 2017-12-16 DIAGNOSIS — M170 Bilateral primary osteoarthritis of knee: Secondary | ICD-10-CM | POA: Diagnosis not present

## 2017-12-16 DIAGNOSIS — D631 Anemia in chronic kidney disease: Secondary | ICD-10-CM | POA: Diagnosis not present

## 2017-12-16 DIAGNOSIS — M1612 Unilateral primary osteoarthritis, left hip: Secondary | ICD-10-CM | POA: Diagnosis not present

## 2017-12-16 DIAGNOSIS — I447 Left bundle-branch block, unspecified: Secondary | ICD-10-CM | POA: Diagnosis not present

## 2017-12-16 DIAGNOSIS — I714 Abdominal aortic aneurysm, without rupture: Secondary | ICD-10-CM | POA: Diagnosis not present

## 2017-12-16 DIAGNOSIS — N183 Chronic kidney disease, stage 3 (moderate): Secondary | ICD-10-CM | POA: Diagnosis not present

## 2017-12-16 DIAGNOSIS — M4807 Spinal stenosis, lumbosacral region: Secondary | ICD-10-CM | POA: Diagnosis not present

## 2017-12-16 DIAGNOSIS — I420 Dilated cardiomyopathy: Secondary | ICD-10-CM | POA: Diagnosis not present

## 2017-12-16 NOTE — Telephone Encounter (Signed)
A rep from [employer] called to check the status of patient's new CPAP machine. Triage was unavailable at the time of the call. I informed the rep that we sent the order to [facility], but I was unsure of the status at this time. She requested that we call patient to inform him of the status and/or follow up with [facility]. Thanks!

## 2017-12-17 DIAGNOSIS — I714 Abdominal aortic aneurysm, without rupture: Secondary | ICD-10-CM | POA: Diagnosis not present

## 2017-12-17 DIAGNOSIS — I420 Dilated cardiomyopathy: Secondary | ICD-10-CM | POA: Diagnosis not present

## 2017-12-17 DIAGNOSIS — N183 Chronic kidney disease, stage 3 (moderate): Secondary | ICD-10-CM | POA: Diagnosis not present

## 2017-12-17 DIAGNOSIS — I4891 Unspecified atrial fibrillation: Secondary | ICD-10-CM | POA: Diagnosis not present

## 2017-12-17 DIAGNOSIS — M4807 Spinal stenosis, lumbosacral region: Secondary | ICD-10-CM | POA: Diagnosis not present

## 2017-12-17 DIAGNOSIS — I5023 Acute on chronic systolic (congestive) heart failure: Secondary | ICD-10-CM | POA: Diagnosis not present

## 2017-12-17 DIAGNOSIS — M170 Bilateral primary osteoarthritis of knee: Secondary | ICD-10-CM | POA: Diagnosis not present

## 2017-12-17 DIAGNOSIS — I130 Hypertensive heart and chronic kidney disease with heart failure and stage 1 through stage 4 chronic kidney disease, or unspecified chronic kidney disease: Secondary | ICD-10-CM | POA: Diagnosis not present

## 2017-12-17 DIAGNOSIS — I25810 Atherosclerosis of coronary artery bypass graft(s) without angina pectoris: Secondary | ICD-10-CM | POA: Diagnosis not present

## 2017-12-17 DIAGNOSIS — I447 Left bundle-branch block, unspecified: Secondary | ICD-10-CM | POA: Diagnosis not present

## 2017-12-17 DIAGNOSIS — D631 Anemia in chronic kidney disease: Secondary | ICD-10-CM | POA: Diagnosis not present

## 2017-12-17 DIAGNOSIS — M1612 Unilateral primary osteoarthritis, left hip: Secondary | ICD-10-CM | POA: Diagnosis not present

## 2017-12-17 NOTE — Telephone Encounter (Signed)
Left another VM for [facility] for status update.

## 2017-12-17 NOTE — Telephone Encounter (Signed)
Left VM for [facility] for status update.

## 2017-12-18 DIAGNOSIS — I714 Abdominal aortic aneurysm, without rupture: Secondary | ICD-10-CM | POA: Diagnosis not present

## 2017-12-18 DIAGNOSIS — I25810 Atherosclerosis of coronary artery bypass graft(s) without angina pectoris: Secondary | ICD-10-CM | POA: Diagnosis not present

## 2017-12-18 DIAGNOSIS — I447 Left bundle-branch block, unspecified: Secondary | ICD-10-CM | POA: Diagnosis not present

## 2017-12-18 DIAGNOSIS — I4891 Unspecified atrial fibrillation: Secondary | ICD-10-CM | POA: Diagnosis not present

## 2017-12-18 DIAGNOSIS — N183 Chronic kidney disease, stage 3 (moderate): Secondary | ICD-10-CM | POA: Diagnosis not present

## 2017-12-18 DIAGNOSIS — I130 Hypertensive heart and chronic kidney disease with heart failure and stage 1 through stage 4 chronic kidney disease, or unspecified chronic kidney disease: Secondary | ICD-10-CM | POA: Diagnosis not present

## 2017-12-18 DIAGNOSIS — M1612 Unilateral primary osteoarthritis, left hip: Secondary | ICD-10-CM | POA: Diagnosis not present

## 2017-12-18 DIAGNOSIS — M170 Bilateral primary osteoarthritis of knee: Secondary | ICD-10-CM | POA: Diagnosis not present

## 2017-12-18 DIAGNOSIS — M4807 Spinal stenosis, lumbosacral region: Secondary | ICD-10-CM | POA: Diagnosis not present

## 2017-12-18 DIAGNOSIS — I420 Dilated cardiomyopathy: Secondary | ICD-10-CM | POA: Diagnosis not present

## 2017-12-18 DIAGNOSIS — I5023 Acute on chronic systolic (congestive) heart failure: Secondary | ICD-10-CM | POA: Diagnosis not present

## 2017-12-18 DIAGNOSIS — D631 Anemia in chronic kidney disease: Secondary | ICD-10-CM | POA: Diagnosis not present

## 2017-12-18 NOTE — Telephone Encounter (Signed)
Spoke with [facility] Rep, Randie Heinz. Pt will need to see PCP for face-to-face office visit, then have a repeat sleep study. Will route to Provider, and for scheduling.

## 2017-12-18 NOTE — Telephone Encounter (Signed)
Left another VM for [facility] for status update.  

## 2017-12-19 DIAGNOSIS — M4807 Spinal stenosis, lumbosacral region: Secondary | ICD-10-CM | POA: Diagnosis not present

## 2017-12-19 DIAGNOSIS — D631 Anemia in chronic kidney disease: Secondary | ICD-10-CM | POA: Diagnosis not present

## 2017-12-19 DIAGNOSIS — N183 Chronic kidney disease, stage 3 (moderate): Secondary | ICD-10-CM | POA: Diagnosis not present

## 2017-12-19 DIAGNOSIS — I447 Left bundle-branch block, unspecified: Secondary | ICD-10-CM | POA: Diagnosis not present

## 2017-12-19 DIAGNOSIS — M1612 Unilateral primary osteoarthritis, left hip: Secondary | ICD-10-CM | POA: Diagnosis not present

## 2017-12-19 DIAGNOSIS — I420 Dilated cardiomyopathy: Secondary | ICD-10-CM | POA: Diagnosis not present

## 2017-12-19 DIAGNOSIS — I25810 Atherosclerosis of coronary artery bypass graft(s) without angina pectoris: Secondary | ICD-10-CM | POA: Diagnosis not present

## 2017-12-19 DIAGNOSIS — I4891 Unspecified atrial fibrillation: Secondary | ICD-10-CM | POA: Diagnosis not present

## 2017-12-19 DIAGNOSIS — M170 Bilateral primary osteoarthritis of knee: Secondary | ICD-10-CM | POA: Diagnosis not present

## 2017-12-19 DIAGNOSIS — I714 Abdominal aortic aneurysm, without rupture: Secondary | ICD-10-CM | POA: Diagnosis not present

## 2017-12-19 DIAGNOSIS — I130 Hypertensive heart and chronic kidney disease with heart failure and stage 1 through stage 4 chronic kidney disease, or unspecified chronic kidney disease: Secondary | ICD-10-CM | POA: Diagnosis not present

## 2017-12-19 DIAGNOSIS — I5023 Acute on chronic systolic (congestive) heart failure: Secondary | ICD-10-CM | POA: Diagnosis not present

## 2017-12-20 ENCOUNTER — Ambulatory Visit (INDEPENDENT_AMBULATORY_CARE_PROVIDER_SITE_OTHER): Payer: Medicare Other | Admitting: Family Medicine

## 2017-12-20 ENCOUNTER — Encounter: Payer: Self-pay | Admitting: Family Medicine

## 2017-12-20 VITALS — BP 102/74 | HR 68 | Ht 75.98 in | Wt 203.0 lb

## 2017-12-20 DIAGNOSIS — Z95810 Presence of automatic (implantable) cardiac defibrillator: Secondary | ICD-10-CM

## 2017-12-20 DIAGNOSIS — Z7901 Long term (current) use of anticoagulants: Secondary | ICD-10-CM

## 2017-12-20 DIAGNOSIS — M6281 Muscle weakness (generalized): Secondary | ICD-10-CM | POA: Diagnosis not present

## 2017-12-20 DIAGNOSIS — T733XXD Exhaustion due to excessive exertion, subsequent encounter: Secondary | ICD-10-CM

## 2017-12-20 DIAGNOSIS — G4733 Obstructive sleep apnea (adult) (pediatric): Secondary | ICD-10-CM

## 2017-12-20 DIAGNOSIS — R259 Unspecified abnormal involuntary movements: Secondary | ICD-10-CM | POA: Diagnosis not present

## 2017-12-20 DIAGNOSIS — M170 Bilateral primary osteoarthritis of knee: Secondary | ICD-10-CM | POA: Diagnosis not present

## 2017-12-20 DIAGNOSIS — I4819 Other persistent atrial fibrillation: Secondary | ICD-10-CM

## 2017-12-20 DIAGNOSIS — I428 Other cardiomyopathies: Secondary | ICD-10-CM

## 2017-12-20 DIAGNOSIS — I481 Persistent atrial fibrillation: Secondary | ICD-10-CM | POA: Diagnosis not present

## 2017-12-20 LAB — POCT INR: INR: 2.6 (ref 2.0–3.0)

## 2017-12-20 NOTE — Patient Instructions (Addendum)
Thank you for coming in today. Continue tylenol.  Recheck with me in 1 month.  Return sooner if needed.   You should hear about sleep study and CPAP machine.

## 2017-12-20 NOTE — Progress Notes (Signed)
[name] [name] is a 84 y.o. male who presents to [name]: Primary Care Sports Medicine today for follow-up Parkinson's features. [name] [name] has been seen multiple times for difficulty with ambulation high fall risk and Parkinson's features.  At the last visit he had followed back up with his neurologist who increased his Sinemet to 3 times daily.  Additionally the neurologist referred to home health physical therapy dedicated for Parkinson's disease.  He notes his symptoms are improving.  He is more stable on his feet and feeling a bit better.    He continues to experience bothersome knee pain thought to be due to DJD however.  He has had repeated steroid injections most recently in April.  He notes this has not helped much at all.  He does use diclofenac gel which does help.  Additionally he takes Tylenol intermittently and notes that it does help.  He is wondering if taking it more regularly would help.  Fall risk: [name] [name] has continued to have multiple falls.  He continues to take warfarin daily for anticoagulation due to atrial fibrillation.  He has had a few significant falls resulting in significant hematomas.  The patient and his daughter are wondering if the risk of anticoagulation now exceeds the benefit of it.  He denies any significant bleeding.  Sleep apnea: [name] [name] has a history of sleep apnea that is previously controlled with CPAP.  His sleep study was performed in 2011 and his CPAP machine is malfunctioning.  Unfortunately we are unable to obtain the face-to-face from the physician in 2011 who ordered the sleep study.  The medical supply company is unable to provide a CPAP without this documentation.  Patient notes that he remains quite fatigued at home.    STOP BANG: Snore:     yes Tired:     yes Observed stop breathing:  yes Hypertension:   yes  BMI >35:   no Age >50:   yes Neck > 16  inches:  no Male gender:   yes ------------------------------------------ Total:     6/8   ROS as above:  Exam:  BP 102/74   Pulse 68   Ht 6' 3.98" (1.93 m)   Wt 203 lb (92.1 kg)   SpO2 98%   BMI 24.72 kg/m  Gen: Well NAD HEENT: EOMI,  MMM Lungs: Normal work of breathing. CTABL Heart: RRR no MRG Abd: NABS, Soft. Nondistended, Nontender Exts: Brisk capillary refill, warm and well perfused.  Left knee no effusion.  Decreased motion. Neuro: No tremor.   Lab and Radiology Results Results for orders placed or performed in visit on 12/20/17 (from the past 72 hour(s))  POCT INR     Status: None   Collection Time: 12/20/17 11:33 AM  Result Value Ref Range   INR 2.6 2.0 - 3.0   No results found.    Assessment and Plan: 84 y.o. male with  Parkinson's features: Agree to continue higher dose of Sinemet as I think this is helpful.  I do think the home health physical therapy has been very helpful as well.  Plan to continue these interventions to maintain independence at home.  Anticoagulation/fall risk: Patient is a significant fall risk and is currently effectively anticoagulated with warfarin.  At this point I am not sure where the risk benefit balance is.  I suspect that he may have less overall risk off of warfarin however I will discuss the case with his cardiologist and relay back information  to [place] and his family at the next visit or sooner.    Knee pain: DJD failing conservative management.  Recommend increasing Tylenol frequency.  Max dose is 3000 mg total per day.  Continue diclofenac gel.  Sleep apnea: At this point I think the next best option is to get a new home sleep study to confirm necessity for CPAP.  Face-to-face visit completed today.  Patient has significantly positive Epworth Sleepiness Scale (17).  STOP-BANG is also positive.  Plan to order home sleep test and then titrate CPAP machine with new supplies.   Orders Placed This Encounter  Procedures  . POCT  INR  . Home sleep test    Standing Status:   Future    Standing Expiration Date:   12/21/2018    Scheduling Instructions:     [facility]     [id number]    Order Specific Question:   Where should this test be performed:    Answer:   Other   No orders of the defined types were placed in this encounter.    Historical information moved to improve visibility of documentation.  Past Medical History:  Diagnosis Date  . Anemia, unspecified 04/07/2013  . Atrial fibrillation (HCC)    fibrillation/flutter  . BPH (benign prostatic hypertrophy)   . Chronic systolic CHF (congestive heart failure) (HCC)    Nonischemic DCM EF 10% s/p BiV AICD  . Coronary artery disease 2008   nonobstructive ASCAD  . Depression   . Hyperlipidemia   . Hypertension   . Hypothyroidism   . Left bundle branch block   . Nonischemic cardiomyopathy (HCC)    ef 10 %  . OSA (obstructive sleep apnea)    intolerant to CPAP  . Osteoarthritis   . Sleep apnea    stopped using CPAP  . Thrombocytopenia (HCC)    Past Surgical History:  Procedure Laterality Date  . CARDIAC DEFIBRILLATOR PLACEMENT     left chest  . CARDIOVERSION N/A 12/29/2012   Procedure: CARDIOVERSION;  Surgeon: Quintella Reichert, MD;  Location: [facility];  Service: Cardiovascular;  Laterality: N/A;  . CHOLECYSTECTOMY    . Colonscopy     reportedly negative per pt; around 2005.    . EP IMPLANTABLE DEVICE N/A 05/09/2016   Procedure: BIVI ICD Generator Changeout;  Surgeon: Duke Salvia, MD;  [facility] INVASIVE CV LAB;  Service: Cardiovascular;  Laterality: N/A;  . TRANSURETHRAL RESECTION OF PROSTATE     for BPH   Social History   Tobacco Use  . Smoking status: Never Smoker  . Smokeless tobacco: Never Used  Substance Use Topics  . Alcohol use: No   family history includes Cancer in his maternal uncle; Heart disease in his father; Heart failure in his mother; Stroke in his mother.  Medications: Current Outpatient Medications  Medication Sig  Dispense Refill  . acetaminophen (TYLENOL) 650 MG CR tablet Take 1 tablet (650 mg total) by mouth every 8 (eight) hours as needed for pain. 90 tablet 3  . AMBULATORY NON FORMULARY MEDICATION Continuous positive airway pressure (CPAP) machine auto-titrate from 4-20 cm of H2O pressure, with all supplemental supplies as needed. [facility] if able. 1 each 0  . carbidopa-levodopa (SINEMET) 25-100 MG tablet Take 1 tablet by mouth 3 (three) times daily.    . diclofenac sodium (VOLTAREN) 1 % GEL Apply 4 g topically 4 (four) times daily. To affected joint. 1440 g 3  . digoxin (DIGOX) 0.125 MG tablet Take 1 tablet (125 mcg total) by mouth  every other day. 45 tablet 2  . levothyroxine (SYNTHROID, LEVOTHROID) 137 MCG tablet TAKE 1 TABLET BY MOUTH  DAILY BEFORE BREAKFAST 90 tablet 1  . mupirocin ointment (BACTROBAN) 2 % Apply to affected area BID for 7 days. 30 g 3  . nystatin cream (MYCOSTATIN) Apply 1 application topically 2 (two) times daily. 30 g 1  . Nystatin POWD Apply liberally to affected area 2 times per day 1 Bottle 11  . potassium chloride SA (K-DUR,KLOR-CON) 20 MEQ tablet Take 1 tablet (20 mEq total) by mouth daily. 90 tablet 3  . simvastatin (ZOCOR) 10 MG tablet TAKE 1 TABLET BY MOUTH  EVERY EVENING 90 tablet 3  . spironolactone (ALDACTONE) 25 MG tablet TAKE ONE-HALF TABLET BY  MOUTH DAILY 45 tablet 2  . torsemide (DEMADEX) 20 MG tablet Take 1 tablet (20 mg total) by mouth daily. 30 tablet 0  . warfarin (COUMADIN) 1 MG tablet Take 1mg  pill in addition to 5mg  pill on Monday, Wed, Friday 90 tablet 1  . warfarin (COUMADIN) 5 MG tablet Take 1 tablet (5 mg total) by mouth daily at 6 PM. 30 tablet 0   No current facility-administered medications for this visit.    Allergies  Allergen Reactions  . Amiodarone Other (See Comments)    Neuro toxicity     Discussed warning signs or symptoms. Please see discharge instructions. Patient expresses understanding.

## 2017-12-23 DIAGNOSIS — I4891 Unspecified atrial fibrillation: Secondary | ICD-10-CM | POA: Diagnosis not present

## 2017-12-23 DIAGNOSIS — M170 Bilateral primary osteoarthritis of knee: Secondary | ICD-10-CM | POA: Diagnosis not present

## 2017-12-23 DIAGNOSIS — I420 Dilated cardiomyopathy: Secondary | ICD-10-CM | POA: Diagnosis not present

## 2017-12-23 DIAGNOSIS — I447 Left bundle-branch block, unspecified: Secondary | ICD-10-CM | POA: Diagnosis not present

## 2017-12-23 DIAGNOSIS — D631 Anemia in chronic kidney disease: Secondary | ICD-10-CM | POA: Diagnosis not present

## 2017-12-23 DIAGNOSIS — M1612 Unilateral primary osteoarthritis, left hip: Secondary | ICD-10-CM | POA: Diagnosis not present

## 2017-12-23 DIAGNOSIS — I5023 Acute on chronic systolic (congestive) heart failure: Secondary | ICD-10-CM | POA: Diagnosis not present

## 2017-12-23 DIAGNOSIS — I25810 Atherosclerosis of coronary artery bypass graft(s) without angina pectoris: Secondary | ICD-10-CM | POA: Diagnosis not present

## 2017-12-23 DIAGNOSIS — M4807 Spinal stenosis, lumbosacral region: Secondary | ICD-10-CM | POA: Diagnosis not present

## 2017-12-23 DIAGNOSIS — N183 Chronic kidney disease, stage 3 (moderate): Secondary | ICD-10-CM | POA: Diagnosis not present

## 2017-12-23 DIAGNOSIS — I714 Abdominal aortic aneurysm, without rupture: Secondary | ICD-10-CM | POA: Diagnosis not present

## 2017-12-23 DIAGNOSIS — I130 Hypertensive heart and chronic kidney disease with heart failure and stage 1 through stage 4 chronic kidney disease, or unspecified chronic kidney disease: Secondary | ICD-10-CM | POA: Diagnosis not present

## 2017-12-24 DIAGNOSIS — M170 Bilateral primary osteoarthritis of knee: Secondary | ICD-10-CM | POA: Diagnosis not present

## 2017-12-24 DIAGNOSIS — I447 Left bundle-branch block, unspecified: Secondary | ICD-10-CM | POA: Diagnosis not present

## 2017-12-24 DIAGNOSIS — I4891 Unspecified atrial fibrillation: Secondary | ICD-10-CM | POA: Diagnosis not present

## 2017-12-24 DIAGNOSIS — N183 Chronic kidney disease, stage 3 (moderate): Secondary | ICD-10-CM | POA: Diagnosis not present

## 2017-12-24 DIAGNOSIS — D631 Anemia in chronic kidney disease: Secondary | ICD-10-CM | POA: Diagnosis not present

## 2017-12-24 DIAGNOSIS — I420 Dilated cardiomyopathy: Secondary | ICD-10-CM | POA: Diagnosis not present

## 2017-12-24 DIAGNOSIS — I5023 Acute on chronic systolic (congestive) heart failure: Secondary | ICD-10-CM | POA: Diagnosis not present

## 2017-12-24 DIAGNOSIS — I130 Hypertensive heart and chronic kidney disease with heart failure and stage 1 through stage 4 chronic kidney disease, or unspecified chronic kidney disease: Secondary | ICD-10-CM | POA: Diagnosis not present

## 2017-12-24 DIAGNOSIS — I714 Abdominal aortic aneurysm, without rupture: Secondary | ICD-10-CM | POA: Diagnosis not present

## 2017-12-24 DIAGNOSIS — M1612 Unilateral primary osteoarthritis, left hip: Secondary | ICD-10-CM | POA: Diagnosis not present

## 2017-12-24 DIAGNOSIS — I25810 Atherosclerosis of coronary artery bypass graft(s) without angina pectoris: Secondary | ICD-10-CM | POA: Diagnosis not present

## 2017-12-24 DIAGNOSIS — M4807 Spinal stenosis, lumbosacral region: Secondary | ICD-10-CM | POA: Diagnosis not present

## 2017-12-25 DIAGNOSIS — I130 Hypertensive heart and chronic kidney disease with heart failure and stage 1 through stage 4 chronic kidney disease, or unspecified chronic kidney disease: Secondary | ICD-10-CM | POA: Diagnosis not present

## 2017-12-25 DIAGNOSIS — I420 Dilated cardiomyopathy: Secondary | ICD-10-CM | POA: Diagnosis not present

## 2017-12-25 DIAGNOSIS — I447 Left bundle-branch block, unspecified: Secondary | ICD-10-CM | POA: Diagnosis not present

## 2017-12-25 DIAGNOSIS — D631 Anemia in chronic kidney disease: Secondary | ICD-10-CM | POA: Diagnosis not present

## 2017-12-25 DIAGNOSIS — I714 Abdominal aortic aneurysm, without rupture: Secondary | ICD-10-CM | POA: Diagnosis not present

## 2017-12-25 DIAGNOSIS — I5023 Acute on chronic systolic (congestive) heart failure: Secondary | ICD-10-CM | POA: Diagnosis not present

## 2017-12-25 DIAGNOSIS — M1612 Unilateral primary osteoarthritis, left hip: Secondary | ICD-10-CM | POA: Diagnosis not present

## 2017-12-25 DIAGNOSIS — M170 Bilateral primary osteoarthritis of knee: Secondary | ICD-10-CM | POA: Diagnosis not present

## 2017-12-25 DIAGNOSIS — M4807 Spinal stenosis, lumbosacral region: Secondary | ICD-10-CM | POA: Diagnosis not present

## 2017-12-25 DIAGNOSIS — N183 Chronic kidney disease, stage 3 (moderate): Secondary | ICD-10-CM | POA: Diagnosis not present

## 2017-12-25 DIAGNOSIS — I25810 Atherosclerosis of coronary artery bypass graft(s) without angina pectoris: Secondary | ICD-10-CM | POA: Diagnosis not present

## 2017-12-25 DIAGNOSIS — I4891 Unspecified atrial fibrillation: Secondary | ICD-10-CM | POA: Diagnosis not present

## 2017-12-26 DIAGNOSIS — I25810 Atherosclerosis of coronary artery bypass graft(s) without angina pectoris: Secondary | ICD-10-CM | POA: Diagnosis not present

## 2017-12-26 DIAGNOSIS — D631 Anemia in chronic kidney disease: Secondary | ICD-10-CM | POA: Diagnosis not present

## 2017-12-26 DIAGNOSIS — I714 Abdominal aortic aneurysm, without rupture: Secondary | ICD-10-CM | POA: Diagnosis not present

## 2017-12-26 DIAGNOSIS — I130 Hypertensive heart and chronic kidney disease with heart failure and stage 1 through stage 4 chronic kidney disease, or unspecified chronic kidney disease: Secondary | ICD-10-CM | POA: Diagnosis not present

## 2017-12-26 DIAGNOSIS — M1612 Unilateral primary osteoarthritis, left hip: Secondary | ICD-10-CM | POA: Diagnosis not present

## 2017-12-26 DIAGNOSIS — I4891 Unspecified atrial fibrillation: Secondary | ICD-10-CM | POA: Diagnosis not present

## 2017-12-26 DIAGNOSIS — N183 Chronic kidney disease, stage 3 (moderate): Secondary | ICD-10-CM | POA: Diagnosis not present

## 2017-12-26 DIAGNOSIS — M4807 Spinal stenosis, lumbosacral region: Secondary | ICD-10-CM | POA: Diagnosis not present

## 2017-12-26 DIAGNOSIS — I5023 Acute on chronic systolic (congestive) heart failure: Secondary | ICD-10-CM | POA: Diagnosis not present

## 2017-12-26 DIAGNOSIS — I447 Left bundle-branch block, unspecified: Secondary | ICD-10-CM | POA: Diagnosis not present

## 2017-12-26 DIAGNOSIS — M170 Bilateral primary osteoarthritis of knee: Secondary | ICD-10-CM | POA: Diagnosis not present

## 2017-12-26 DIAGNOSIS — I420 Dilated cardiomyopathy: Secondary | ICD-10-CM | POA: Diagnosis not present

## 2017-12-30 DIAGNOSIS — M1612 Unilateral primary osteoarthritis, left hip: Secondary | ICD-10-CM | POA: Diagnosis not present

## 2017-12-30 DIAGNOSIS — N183 Chronic kidney disease, stage 3 (moderate): Secondary | ICD-10-CM | POA: Diagnosis not present

## 2017-12-30 DIAGNOSIS — I714 Abdominal aortic aneurysm, without rupture: Secondary | ICD-10-CM | POA: Diagnosis not present

## 2017-12-30 DIAGNOSIS — M4807 Spinal stenosis, lumbosacral region: Secondary | ICD-10-CM | POA: Diagnosis not present

## 2017-12-30 DIAGNOSIS — I25810 Atherosclerosis of coronary artery bypass graft(s) without angina pectoris: Secondary | ICD-10-CM | POA: Diagnosis not present

## 2017-12-30 DIAGNOSIS — D631 Anemia in chronic kidney disease: Secondary | ICD-10-CM | POA: Diagnosis not present

## 2017-12-30 DIAGNOSIS — I4891 Unspecified atrial fibrillation: Secondary | ICD-10-CM | POA: Diagnosis not present

## 2017-12-30 DIAGNOSIS — I130 Hypertensive heart and chronic kidney disease with heart failure and stage 1 through stage 4 chronic kidney disease, or unspecified chronic kidney disease: Secondary | ICD-10-CM | POA: Diagnosis not present

## 2017-12-30 DIAGNOSIS — M170 Bilateral primary osteoarthritis of knee: Secondary | ICD-10-CM | POA: Diagnosis not present

## 2017-12-30 DIAGNOSIS — I5023 Acute on chronic systolic (congestive) heart failure: Secondary | ICD-10-CM | POA: Diagnosis not present

## 2017-12-30 DIAGNOSIS — I420 Dilated cardiomyopathy: Secondary | ICD-10-CM | POA: Diagnosis not present

## 2017-12-30 DIAGNOSIS — I447 Left bundle-branch block, unspecified: Secondary | ICD-10-CM | POA: Diagnosis not present

## 2017-12-31 DIAGNOSIS — I5023 Acute on chronic systolic (congestive) heart failure: Secondary | ICD-10-CM | POA: Diagnosis not present

## 2017-12-31 DIAGNOSIS — I420 Dilated cardiomyopathy: Secondary | ICD-10-CM | POA: Diagnosis not present

## 2017-12-31 DIAGNOSIS — I447 Left bundle-branch block, unspecified: Secondary | ICD-10-CM | POA: Diagnosis not present

## 2017-12-31 DIAGNOSIS — M1612 Unilateral primary osteoarthritis, left hip: Secondary | ICD-10-CM | POA: Diagnosis not present

## 2017-12-31 DIAGNOSIS — D631 Anemia in chronic kidney disease: Secondary | ICD-10-CM | POA: Diagnosis not present

## 2017-12-31 DIAGNOSIS — M170 Bilateral primary osteoarthritis of knee: Secondary | ICD-10-CM | POA: Diagnosis not present

## 2017-12-31 DIAGNOSIS — M4807 Spinal stenosis, lumbosacral region: Secondary | ICD-10-CM | POA: Diagnosis not present

## 2017-12-31 DIAGNOSIS — I25810 Atherosclerosis of coronary artery bypass graft(s) without angina pectoris: Secondary | ICD-10-CM | POA: Diagnosis not present

## 2017-12-31 DIAGNOSIS — I4891 Unspecified atrial fibrillation: Secondary | ICD-10-CM | POA: Diagnosis not present

## 2017-12-31 DIAGNOSIS — N183 Chronic kidney disease, stage 3 (moderate): Secondary | ICD-10-CM | POA: Diagnosis not present

## 2017-12-31 DIAGNOSIS — I130 Hypertensive heart and chronic kidney disease with heart failure and stage 1 through stage 4 chronic kidney disease, or unspecified chronic kidney disease: Secondary | ICD-10-CM | POA: Diagnosis not present

## 2017-12-31 DIAGNOSIS — I714 Abdominal aortic aneurysm, without rupture: Secondary | ICD-10-CM | POA: Diagnosis not present

## 2018-01-01 DIAGNOSIS — D631 Anemia in chronic kidney disease: Secondary | ICD-10-CM | POA: Diagnosis not present

## 2018-01-01 DIAGNOSIS — M1612 Unilateral primary osteoarthritis, left hip: Secondary | ICD-10-CM | POA: Diagnosis not present

## 2018-01-01 DIAGNOSIS — I420 Dilated cardiomyopathy: Secondary | ICD-10-CM | POA: Diagnosis not present

## 2018-01-01 DIAGNOSIS — I5023 Acute on chronic systolic (congestive) heart failure: Secondary | ICD-10-CM | POA: Diagnosis not present

## 2018-01-01 DIAGNOSIS — M4807 Spinal stenosis, lumbosacral region: Secondary | ICD-10-CM | POA: Diagnosis not present

## 2018-01-01 DIAGNOSIS — I130 Hypertensive heart and chronic kidney disease with heart failure and stage 1 through stage 4 chronic kidney disease, or unspecified chronic kidney disease: Secondary | ICD-10-CM | POA: Diagnosis not present

## 2018-01-01 DIAGNOSIS — N183 Chronic kidney disease, stage 3 (moderate): Secondary | ICD-10-CM | POA: Diagnosis not present

## 2018-01-01 DIAGNOSIS — I447 Left bundle-branch block, unspecified: Secondary | ICD-10-CM | POA: Diagnosis not present

## 2018-01-01 DIAGNOSIS — M170 Bilateral primary osteoarthritis of knee: Secondary | ICD-10-CM | POA: Diagnosis not present

## 2018-01-01 DIAGNOSIS — I25810 Atherosclerosis of coronary artery bypass graft(s) without angina pectoris: Secondary | ICD-10-CM | POA: Diagnosis not present

## 2018-01-01 DIAGNOSIS — I714 Abdominal aortic aneurysm, without rupture: Secondary | ICD-10-CM | POA: Diagnosis not present

## 2018-01-01 DIAGNOSIS — I4891 Unspecified atrial fibrillation: Secondary | ICD-10-CM | POA: Diagnosis not present

## 2018-01-02 ENCOUNTER — Encounter: Payer: Self-pay | Admitting: Family Medicine

## 2018-01-02 DIAGNOSIS — G4730 Sleep apnea, unspecified: Secondary | ICD-10-CM | POA: Diagnosis not present

## 2018-01-02 DIAGNOSIS — I714 Abdominal aortic aneurysm, without rupture: Secondary | ICD-10-CM | POA: Diagnosis not present

## 2018-01-02 DIAGNOSIS — I420 Dilated cardiomyopathy: Secondary | ICD-10-CM | POA: Diagnosis not present

## 2018-01-02 DIAGNOSIS — M170 Bilateral primary osteoarthritis of knee: Secondary | ICD-10-CM | POA: Diagnosis not present

## 2018-01-02 DIAGNOSIS — M1612 Unilateral primary osteoarthritis, left hip: Secondary | ICD-10-CM | POA: Diagnosis not present

## 2018-01-02 DIAGNOSIS — I130 Hypertensive heart and chronic kidney disease with heart failure and stage 1 through stage 4 chronic kidney disease, or unspecified chronic kidney disease: Secondary | ICD-10-CM | POA: Diagnosis not present

## 2018-01-02 DIAGNOSIS — D631 Anemia in chronic kidney disease: Secondary | ICD-10-CM | POA: Diagnosis not present

## 2018-01-02 DIAGNOSIS — I5023 Acute on chronic systolic (congestive) heart failure: Secondary | ICD-10-CM | POA: Diagnosis not present

## 2018-01-02 DIAGNOSIS — I25810 Atherosclerosis of coronary artery bypass graft(s) without angina pectoris: Secondary | ICD-10-CM | POA: Diagnosis not present

## 2018-01-02 DIAGNOSIS — M4807 Spinal stenosis, lumbosacral region: Secondary | ICD-10-CM | POA: Diagnosis not present

## 2018-01-02 DIAGNOSIS — I4891 Unspecified atrial fibrillation: Secondary | ICD-10-CM | POA: Diagnosis not present

## 2018-01-02 DIAGNOSIS — N183 Chronic kidney disease, stage 3 (moderate): Secondary | ICD-10-CM | POA: Diagnosis not present

## 2018-01-02 DIAGNOSIS — I447 Left bundle-branch block, unspecified: Secondary | ICD-10-CM | POA: Diagnosis not present

## 2018-01-03 DIAGNOSIS — M6281 Muscle weakness (generalized): Secondary | ICD-10-CM | POA: Diagnosis not present

## 2018-01-03 DIAGNOSIS — S76111D Strain of right quadriceps muscle, fascia and tendon, subsequent encounter: Secondary | ICD-10-CM | POA: Diagnosis not present

## 2018-01-03 DIAGNOSIS — I5032 Chronic diastolic (congestive) heart failure: Secondary | ICD-10-CM | POA: Diagnosis not present

## 2018-01-03 DIAGNOSIS — M48061 Spinal stenosis, lumbar region without neurogenic claudication: Secondary | ICD-10-CM | POA: Diagnosis not present

## 2018-01-06 DIAGNOSIS — M170 Bilateral primary osteoarthritis of knee: Secondary | ICD-10-CM | POA: Diagnosis not present

## 2018-01-06 DIAGNOSIS — M4807 Spinal stenosis, lumbosacral region: Secondary | ICD-10-CM | POA: Diagnosis not present

## 2018-01-06 DIAGNOSIS — M1612 Unilateral primary osteoarthritis, left hip: Secondary | ICD-10-CM | POA: Diagnosis not present

## 2018-01-06 DIAGNOSIS — N183 Chronic kidney disease, stage 3 (moderate): Secondary | ICD-10-CM | POA: Diagnosis not present

## 2018-01-06 DIAGNOSIS — I25810 Atherosclerosis of coronary artery bypass graft(s) without angina pectoris: Secondary | ICD-10-CM | POA: Diagnosis not present

## 2018-01-06 DIAGNOSIS — I447 Left bundle-branch block, unspecified: Secondary | ICD-10-CM | POA: Diagnosis not present

## 2018-01-06 DIAGNOSIS — I4891 Unspecified atrial fibrillation: Secondary | ICD-10-CM | POA: Diagnosis not present

## 2018-01-06 DIAGNOSIS — I5023 Acute on chronic systolic (congestive) heart failure: Secondary | ICD-10-CM | POA: Diagnosis not present

## 2018-01-06 DIAGNOSIS — I714 Abdominal aortic aneurysm, without rupture: Secondary | ICD-10-CM | POA: Diagnosis not present

## 2018-01-06 DIAGNOSIS — I420 Dilated cardiomyopathy: Secondary | ICD-10-CM | POA: Diagnosis not present

## 2018-01-06 DIAGNOSIS — I130 Hypertensive heart and chronic kidney disease with heart failure and stage 1 through stage 4 chronic kidney disease, or unspecified chronic kidney disease: Secondary | ICD-10-CM | POA: Diagnosis not present

## 2018-01-06 DIAGNOSIS — D631 Anemia in chronic kidney disease: Secondary | ICD-10-CM | POA: Diagnosis not present

## 2018-01-07 ENCOUNTER — Other Ambulatory Visit: Payer: Self-pay

## 2018-01-07 DIAGNOSIS — M4807 Spinal stenosis, lumbosacral region: Secondary | ICD-10-CM | POA: Diagnosis not present

## 2018-01-07 DIAGNOSIS — D631 Anemia in chronic kidney disease: Secondary | ICD-10-CM | POA: Diagnosis not present

## 2018-01-07 DIAGNOSIS — N183 Chronic kidney disease, stage 3 (moderate): Secondary | ICD-10-CM | POA: Diagnosis not present

## 2018-01-07 DIAGNOSIS — I4891 Unspecified atrial fibrillation: Secondary | ICD-10-CM | POA: Diagnosis not present

## 2018-01-07 DIAGNOSIS — I420 Dilated cardiomyopathy: Secondary | ICD-10-CM | POA: Diagnosis not present

## 2018-01-07 DIAGNOSIS — I130 Hypertensive heart and chronic kidney disease with heart failure and stage 1 through stage 4 chronic kidney disease, or unspecified chronic kidney disease: Secondary | ICD-10-CM | POA: Diagnosis not present

## 2018-01-07 DIAGNOSIS — I447 Left bundle-branch block, unspecified: Secondary | ICD-10-CM | POA: Diagnosis not present

## 2018-01-07 DIAGNOSIS — I714 Abdominal aortic aneurysm, without rupture: Secondary | ICD-10-CM | POA: Diagnosis not present

## 2018-01-07 DIAGNOSIS — M1612 Unilateral primary osteoarthritis, left hip: Secondary | ICD-10-CM | POA: Diagnosis not present

## 2018-01-07 DIAGNOSIS — I25810 Atherosclerosis of coronary artery bypass graft(s) without angina pectoris: Secondary | ICD-10-CM | POA: Diagnosis not present

## 2018-01-07 DIAGNOSIS — M170 Bilateral primary osteoarthritis of knee: Secondary | ICD-10-CM | POA: Diagnosis not present

## 2018-01-07 DIAGNOSIS — I5023 Acute on chronic systolic (congestive) heart failure: Secondary | ICD-10-CM | POA: Diagnosis not present

## 2018-01-07 NOTE — Patient Outreach (Signed)
[facility]) Care Management  01/07/2018  [name] [name] September 16, 1931 [id number]   Medication Adherence call to [name] [name] spoke with patient he is no longer taking Ramipril 5 mg doctor took him off he said he is doing great with out but,he has an appointment next month and will go over all his medication with doctor.[name] [name] is showing past due under [employer].  Lillia Abed CPhT Pharmacy Technician [facility] Care Management Direct Dial [phone]  Fax [phone] [email or web address]

## 2018-01-08 DIAGNOSIS — I25810 Atherosclerosis of coronary artery bypass graft(s) without angina pectoris: Secondary | ICD-10-CM | POA: Diagnosis not present

## 2018-01-08 DIAGNOSIS — M4807 Spinal stenosis, lumbosacral region: Secondary | ICD-10-CM | POA: Diagnosis not present

## 2018-01-08 DIAGNOSIS — I447 Left bundle-branch block, unspecified: Secondary | ICD-10-CM | POA: Diagnosis not present

## 2018-01-08 DIAGNOSIS — N183 Chronic kidney disease, stage 3 (moderate): Secondary | ICD-10-CM | POA: Diagnosis not present

## 2018-01-08 DIAGNOSIS — M1612 Unilateral primary osteoarthritis, left hip: Secondary | ICD-10-CM | POA: Diagnosis not present

## 2018-01-08 DIAGNOSIS — I130 Hypertensive heart and chronic kidney disease with heart failure and stage 1 through stage 4 chronic kidney disease, or unspecified chronic kidney disease: Secondary | ICD-10-CM | POA: Diagnosis not present

## 2018-01-08 DIAGNOSIS — D631 Anemia in chronic kidney disease: Secondary | ICD-10-CM | POA: Diagnosis not present

## 2018-01-08 DIAGNOSIS — M170 Bilateral primary osteoarthritis of knee: Secondary | ICD-10-CM | POA: Diagnosis not present

## 2018-01-08 DIAGNOSIS — I4891 Unspecified atrial fibrillation: Secondary | ICD-10-CM | POA: Diagnosis not present

## 2018-01-08 DIAGNOSIS — I714 Abdominal aortic aneurysm, without rupture: Secondary | ICD-10-CM | POA: Diagnosis not present

## 2018-01-08 DIAGNOSIS — I5023 Acute on chronic systolic (congestive) heart failure: Secondary | ICD-10-CM | POA: Diagnosis not present

## 2018-01-08 DIAGNOSIS — I420 Dilated cardiomyopathy: Secondary | ICD-10-CM | POA: Diagnosis not present

## 2018-01-09 DIAGNOSIS — I714 Abdominal aortic aneurysm, without rupture: Secondary | ICD-10-CM | POA: Diagnosis not present

## 2018-01-09 DIAGNOSIS — I130 Hypertensive heart and chronic kidney disease with heart failure and stage 1 through stage 4 chronic kidney disease, or unspecified chronic kidney disease: Secondary | ICD-10-CM | POA: Diagnosis not present

## 2018-01-09 DIAGNOSIS — I447 Left bundle-branch block, unspecified: Secondary | ICD-10-CM | POA: Diagnosis not present

## 2018-01-09 DIAGNOSIS — I420 Dilated cardiomyopathy: Secondary | ICD-10-CM | POA: Diagnosis not present

## 2018-01-09 DIAGNOSIS — M170 Bilateral primary osteoarthritis of knee: Secondary | ICD-10-CM | POA: Diagnosis not present

## 2018-01-09 DIAGNOSIS — D631 Anemia in chronic kidney disease: Secondary | ICD-10-CM | POA: Diagnosis not present

## 2018-01-09 DIAGNOSIS — N183 Chronic kidney disease, stage 3 (moderate): Secondary | ICD-10-CM | POA: Diagnosis not present

## 2018-01-09 DIAGNOSIS — I4891 Unspecified atrial fibrillation: Secondary | ICD-10-CM | POA: Diagnosis not present

## 2018-01-09 DIAGNOSIS — I25810 Atherosclerosis of coronary artery bypass graft(s) without angina pectoris: Secondary | ICD-10-CM | POA: Diagnosis not present

## 2018-01-09 DIAGNOSIS — I5023 Acute on chronic systolic (congestive) heart failure: Secondary | ICD-10-CM | POA: Diagnosis not present

## 2018-01-09 DIAGNOSIS — M1612 Unilateral primary osteoarthritis, left hip: Secondary | ICD-10-CM | POA: Diagnosis not present

## 2018-01-09 DIAGNOSIS — M4807 Spinal stenosis, lumbosacral region: Secondary | ICD-10-CM | POA: Diagnosis not present

## 2018-01-13 DIAGNOSIS — I4891 Unspecified atrial fibrillation: Secondary | ICD-10-CM | POA: Diagnosis not present

## 2018-01-13 DIAGNOSIS — I714 Abdominal aortic aneurysm, without rupture: Secondary | ICD-10-CM | POA: Diagnosis not present

## 2018-01-13 DIAGNOSIS — M170 Bilateral primary osteoarthritis of knee: Secondary | ICD-10-CM | POA: Diagnosis not present

## 2018-01-13 DIAGNOSIS — I25810 Atherosclerosis of coronary artery bypass graft(s) without angina pectoris: Secondary | ICD-10-CM | POA: Diagnosis not present

## 2018-01-13 DIAGNOSIS — I130 Hypertensive heart and chronic kidney disease with heart failure and stage 1 through stage 4 chronic kidney disease, or unspecified chronic kidney disease: Secondary | ICD-10-CM | POA: Diagnosis not present

## 2018-01-13 DIAGNOSIS — I5023 Acute on chronic systolic (congestive) heart failure: Secondary | ICD-10-CM | POA: Diagnosis not present

## 2018-01-13 DIAGNOSIS — D631 Anemia in chronic kidney disease: Secondary | ICD-10-CM | POA: Diagnosis not present

## 2018-01-13 DIAGNOSIS — N183 Chronic kidney disease, stage 3 (moderate): Secondary | ICD-10-CM | POA: Diagnosis not present

## 2018-01-13 DIAGNOSIS — M1612 Unilateral primary osteoarthritis, left hip: Secondary | ICD-10-CM | POA: Diagnosis not present

## 2018-01-13 DIAGNOSIS — I420 Dilated cardiomyopathy: Secondary | ICD-10-CM | POA: Diagnosis not present

## 2018-01-13 DIAGNOSIS — M4807 Spinal stenosis, lumbosacral region: Secondary | ICD-10-CM | POA: Diagnosis not present

## 2018-01-13 DIAGNOSIS — I447 Left bundle-branch block, unspecified: Secondary | ICD-10-CM | POA: Diagnosis not present

## 2018-01-14 DIAGNOSIS — I447 Left bundle-branch block, unspecified: Secondary | ICD-10-CM | POA: Diagnosis not present

## 2018-01-14 DIAGNOSIS — M170 Bilateral primary osteoarthritis of knee: Secondary | ICD-10-CM | POA: Diagnosis not present

## 2018-01-14 DIAGNOSIS — I5023 Acute on chronic systolic (congestive) heart failure: Secondary | ICD-10-CM | POA: Diagnosis not present

## 2018-01-14 DIAGNOSIS — I4891 Unspecified atrial fibrillation: Secondary | ICD-10-CM | POA: Diagnosis not present

## 2018-01-14 DIAGNOSIS — I714 Abdominal aortic aneurysm, without rupture: Secondary | ICD-10-CM | POA: Diagnosis not present

## 2018-01-14 DIAGNOSIS — I420 Dilated cardiomyopathy: Secondary | ICD-10-CM | POA: Diagnosis not present

## 2018-01-14 DIAGNOSIS — M4807 Spinal stenosis, lumbosacral region: Secondary | ICD-10-CM | POA: Diagnosis not present

## 2018-01-14 DIAGNOSIS — M1612 Unilateral primary osteoarthritis, left hip: Secondary | ICD-10-CM | POA: Diagnosis not present

## 2018-01-14 DIAGNOSIS — I25810 Atherosclerosis of coronary artery bypass graft(s) without angina pectoris: Secondary | ICD-10-CM | POA: Diagnosis not present

## 2018-01-14 DIAGNOSIS — I130 Hypertensive heart and chronic kidney disease with heart failure and stage 1 through stage 4 chronic kidney disease, or unspecified chronic kidney disease: Secondary | ICD-10-CM | POA: Diagnosis not present

## 2018-01-14 DIAGNOSIS — D631 Anemia in chronic kidney disease: Secondary | ICD-10-CM | POA: Diagnosis not present

## 2018-01-14 DIAGNOSIS — N183 Chronic kidney disease, stage 3 (moderate): Secondary | ICD-10-CM | POA: Diagnosis not present

## 2018-01-16 ENCOUNTER — Telehealth: Payer: Self-pay | Admitting: Family Medicine

## 2018-01-16 DIAGNOSIS — N183 Chronic kidney disease, stage 3 (moderate): Secondary | ICD-10-CM | POA: Diagnosis not present

## 2018-01-16 DIAGNOSIS — I130 Hypertensive heart and chronic kidney disease with heart failure and stage 1 through stage 4 chronic kidney disease, or unspecified chronic kidney disease: Secondary | ICD-10-CM | POA: Diagnosis not present

## 2018-01-16 DIAGNOSIS — M4807 Spinal stenosis, lumbosacral region: Secondary | ICD-10-CM | POA: Diagnosis not present

## 2018-01-16 DIAGNOSIS — I447 Left bundle-branch block, unspecified: Secondary | ICD-10-CM | POA: Diagnosis not present

## 2018-01-16 DIAGNOSIS — I25810 Atherosclerosis of coronary artery bypass graft(s) without angina pectoris: Secondary | ICD-10-CM | POA: Diagnosis not present

## 2018-01-16 DIAGNOSIS — M170 Bilateral primary osteoarthritis of knee: Secondary | ICD-10-CM | POA: Diagnosis not present

## 2018-01-16 DIAGNOSIS — I5023 Acute on chronic systolic (congestive) heart failure: Secondary | ICD-10-CM | POA: Diagnosis not present

## 2018-01-16 DIAGNOSIS — M1612 Unilateral primary osteoarthritis, left hip: Secondary | ICD-10-CM | POA: Diagnosis not present

## 2018-01-16 DIAGNOSIS — I714 Abdominal aortic aneurysm, without rupture: Secondary | ICD-10-CM | POA: Diagnosis not present

## 2018-01-16 DIAGNOSIS — I4891 Unspecified atrial fibrillation: Secondary | ICD-10-CM | POA: Diagnosis not present

## 2018-01-16 DIAGNOSIS — D631 Anemia in chronic kidney disease: Secondary | ICD-10-CM | POA: Diagnosis not present

## 2018-01-16 DIAGNOSIS — I420 Dilated cardiomyopathy: Secondary | ICD-10-CM | POA: Diagnosis not present

## 2018-01-16 NOTE — Telephone Encounter (Signed)
Verbal given 

## 2018-01-16 NOTE — Telephone Encounter (Signed)
Okay to give verbal order for physical therapy.

## 2018-01-16 NOTE — Telephone Encounter (Addendum)
Diane, PT for  [facility] called. She is asking for Verbal Orders for patient to get PT beginning next Tuesday  July 30th  twice a week for 9 weeks and also a Home Health Aid for bathing and ADL's also for the same length of time and frequency. She can be reached at [phone].

## 2018-01-20 ENCOUNTER — Telehealth: Payer: Self-pay | Admitting: Family Medicine

## 2018-01-20 MED ORDER — AMBULATORY NON FORMULARY MEDICATION: 0 refills | Status: DC

## 2018-01-20 NOTE — Telephone Encounter (Signed)
Order faxed to [facility] on 01/20/2018. Rhonda Cunningham,CMA

## 2018-01-20 NOTE — Telephone Encounter (Signed)
Home sleep study report received.  AHI 49.1.  Interpretation severe sleep apnea. Results will be scanned..  CPAP ordered.

## 2018-01-21 DIAGNOSIS — M170 Bilateral primary osteoarthritis of knee: Secondary | ICD-10-CM | POA: Diagnosis not present

## 2018-01-21 DIAGNOSIS — I420 Dilated cardiomyopathy: Secondary | ICD-10-CM | POA: Diagnosis not present

## 2018-01-21 DIAGNOSIS — I25810 Atherosclerosis of coronary artery bypass graft(s) without angina pectoris: Secondary | ICD-10-CM | POA: Diagnosis not present

## 2018-01-21 DIAGNOSIS — I447 Left bundle-branch block, unspecified: Secondary | ICD-10-CM | POA: Diagnosis not present

## 2018-01-21 DIAGNOSIS — I4891 Unspecified atrial fibrillation: Secondary | ICD-10-CM | POA: Diagnosis not present

## 2018-01-21 DIAGNOSIS — M4807 Spinal stenosis, lumbosacral region: Secondary | ICD-10-CM | POA: Diagnosis not present

## 2018-01-21 DIAGNOSIS — I130 Hypertensive heart and chronic kidney disease with heart failure and stage 1 through stage 4 chronic kidney disease, or unspecified chronic kidney disease: Secondary | ICD-10-CM | POA: Diagnosis not present

## 2018-01-21 DIAGNOSIS — D631 Anemia in chronic kidney disease: Secondary | ICD-10-CM | POA: Diagnosis not present

## 2018-01-21 DIAGNOSIS — I5023 Acute on chronic systolic (congestive) heart failure: Secondary | ICD-10-CM | POA: Diagnosis not present

## 2018-01-21 DIAGNOSIS — N183 Chronic kidney disease, stage 3 (moderate): Secondary | ICD-10-CM | POA: Diagnosis not present

## 2018-01-21 DIAGNOSIS — I714 Abdominal aortic aneurysm, without rupture: Secondary | ICD-10-CM | POA: Diagnosis not present

## 2018-01-21 DIAGNOSIS — M1612 Unilateral primary osteoarthritis, left hip: Secondary | ICD-10-CM | POA: Diagnosis not present

## 2018-01-23 DIAGNOSIS — I420 Dilated cardiomyopathy: Secondary | ICD-10-CM | POA: Diagnosis not present

## 2018-01-23 DIAGNOSIS — I25810 Atherosclerosis of coronary artery bypass graft(s) without angina pectoris: Secondary | ICD-10-CM | POA: Diagnosis not present

## 2018-01-23 DIAGNOSIS — I130 Hypertensive heart and chronic kidney disease with heart failure and stage 1 through stage 4 chronic kidney disease, or unspecified chronic kidney disease: Secondary | ICD-10-CM | POA: Diagnosis not present

## 2018-01-23 DIAGNOSIS — I4891 Unspecified atrial fibrillation: Secondary | ICD-10-CM | POA: Diagnosis not present

## 2018-01-23 DIAGNOSIS — M170 Bilateral primary osteoarthritis of knee: Secondary | ICD-10-CM | POA: Diagnosis not present

## 2018-01-23 DIAGNOSIS — D631 Anemia in chronic kidney disease: Secondary | ICD-10-CM | POA: Diagnosis not present

## 2018-01-23 DIAGNOSIS — N183 Chronic kidney disease, stage 3 (moderate): Secondary | ICD-10-CM | POA: Diagnosis not present

## 2018-01-23 DIAGNOSIS — I5023 Acute on chronic systolic (congestive) heart failure: Secondary | ICD-10-CM | POA: Diagnosis not present

## 2018-01-23 DIAGNOSIS — M1612 Unilateral primary osteoarthritis, left hip: Secondary | ICD-10-CM | POA: Diagnosis not present

## 2018-01-23 DIAGNOSIS — I714 Abdominal aortic aneurysm, without rupture: Secondary | ICD-10-CM | POA: Diagnosis not present

## 2018-01-23 DIAGNOSIS — I447 Left bundle-branch block, unspecified: Secondary | ICD-10-CM | POA: Diagnosis not present

## 2018-01-23 DIAGNOSIS — M4807 Spinal stenosis, lumbosacral region: Secondary | ICD-10-CM | POA: Diagnosis not present

## 2018-01-24 DIAGNOSIS — M170 Bilateral primary osteoarthritis of knee: Secondary | ICD-10-CM | POA: Diagnosis not present

## 2018-01-24 DIAGNOSIS — I25810 Atherosclerosis of coronary artery bypass graft(s) without angina pectoris: Secondary | ICD-10-CM | POA: Diagnosis not present

## 2018-01-24 DIAGNOSIS — I714 Abdominal aortic aneurysm, without rupture: Secondary | ICD-10-CM | POA: Diagnosis not present

## 2018-01-24 DIAGNOSIS — I130 Hypertensive heart and chronic kidney disease with heart failure and stage 1 through stage 4 chronic kidney disease, or unspecified chronic kidney disease: Secondary | ICD-10-CM | POA: Diagnosis not present

## 2018-01-24 DIAGNOSIS — I420 Dilated cardiomyopathy: Secondary | ICD-10-CM | POA: Diagnosis not present

## 2018-01-24 DIAGNOSIS — I5023 Acute on chronic systolic (congestive) heart failure: Secondary | ICD-10-CM | POA: Diagnosis not present

## 2018-01-24 DIAGNOSIS — I447 Left bundle-branch block, unspecified: Secondary | ICD-10-CM | POA: Diagnosis not present

## 2018-01-24 DIAGNOSIS — N183 Chronic kidney disease, stage 3 (moderate): Secondary | ICD-10-CM | POA: Diagnosis not present

## 2018-01-24 DIAGNOSIS — I4891 Unspecified atrial fibrillation: Secondary | ICD-10-CM | POA: Diagnosis not present

## 2018-01-24 DIAGNOSIS — M1612 Unilateral primary osteoarthritis, left hip: Secondary | ICD-10-CM | POA: Diagnosis not present

## 2018-01-24 DIAGNOSIS — M4807 Spinal stenosis, lumbosacral region: Secondary | ICD-10-CM | POA: Diagnosis not present

## 2018-01-24 DIAGNOSIS — D631 Anemia in chronic kidney disease: Secondary | ICD-10-CM | POA: Diagnosis not present

## 2018-01-28 DIAGNOSIS — I447 Left bundle-branch block, unspecified: Secondary | ICD-10-CM | POA: Diagnosis not present

## 2018-01-28 DIAGNOSIS — I25810 Atherosclerosis of coronary artery bypass graft(s) without angina pectoris: Secondary | ICD-10-CM | POA: Diagnosis not present

## 2018-01-28 DIAGNOSIS — I420 Dilated cardiomyopathy: Secondary | ICD-10-CM | POA: Diagnosis not present

## 2018-01-28 DIAGNOSIS — I4891 Unspecified atrial fibrillation: Secondary | ICD-10-CM | POA: Diagnosis not present

## 2018-01-28 DIAGNOSIS — I5023 Acute on chronic systolic (congestive) heart failure: Secondary | ICD-10-CM | POA: Diagnosis not present

## 2018-01-28 DIAGNOSIS — N183 Chronic kidney disease, stage 3 (moderate): Secondary | ICD-10-CM | POA: Diagnosis not present

## 2018-01-28 DIAGNOSIS — M170 Bilateral primary osteoarthritis of knee: Secondary | ICD-10-CM | POA: Diagnosis not present

## 2018-01-28 DIAGNOSIS — M4807 Spinal stenosis, lumbosacral region: Secondary | ICD-10-CM | POA: Diagnosis not present

## 2018-01-28 DIAGNOSIS — D631 Anemia in chronic kidney disease: Secondary | ICD-10-CM | POA: Diagnosis not present

## 2018-01-28 DIAGNOSIS — M1612 Unilateral primary osteoarthritis, left hip: Secondary | ICD-10-CM | POA: Diagnosis not present

## 2018-01-28 DIAGNOSIS — I130 Hypertensive heart and chronic kidney disease with heart failure and stage 1 through stage 4 chronic kidney disease, or unspecified chronic kidney disease: Secondary | ICD-10-CM | POA: Diagnosis not present

## 2018-01-28 DIAGNOSIS — I714 Abdominal aortic aneurysm, without rupture: Secondary | ICD-10-CM | POA: Diagnosis not present

## 2018-01-29 DIAGNOSIS — I25810 Atherosclerosis of coronary artery bypass graft(s) without angina pectoris: Secondary | ICD-10-CM | POA: Diagnosis not present

## 2018-01-29 DIAGNOSIS — I4891 Unspecified atrial fibrillation: Secondary | ICD-10-CM | POA: Diagnosis not present

## 2018-01-29 DIAGNOSIS — M170 Bilateral primary osteoarthritis of knee: Secondary | ICD-10-CM | POA: Diagnosis not present

## 2018-01-29 DIAGNOSIS — I447 Left bundle-branch block, unspecified: Secondary | ICD-10-CM | POA: Diagnosis not present

## 2018-01-29 DIAGNOSIS — M4807 Spinal stenosis, lumbosacral region: Secondary | ICD-10-CM | POA: Diagnosis not present

## 2018-01-29 DIAGNOSIS — I714 Abdominal aortic aneurysm, without rupture: Secondary | ICD-10-CM | POA: Diagnosis not present

## 2018-01-29 DIAGNOSIS — M1612 Unilateral primary osteoarthritis, left hip: Secondary | ICD-10-CM | POA: Diagnosis not present

## 2018-01-29 DIAGNOSIS — D631 Anemia in chronic kidney disease: Secondary | ICD-10-CM | POA: Diagnosis not present

## 2018-01-29 DIAGNOSIS — I5023 Acute on chronic systolic (congestive) heart failure: Secondary | ICD-10-CM | POA: Diagnosis not present

## 2018-01-29 DIAGNOSIS — N183 Chronic kidney disease, stage 3 (moderate): Secondary | ICD-10-CM | POA: Diagnosis not present

## 2018-01-29 DIAGNOSIS — I420 Dilated cardiomyopathy: Secondary | ICD-10-CM | POA: Diagnosis not present

## 2018-01-29 DIAGNOSIS — I130 Hypertensive heart and chronic kidney disease with heart failure and stage 1 through stage 4 chronic kidney disease, or unspecified chronic kidney disease: Secondary | ICD-10-CM | POA: Diagnosis not present

## 2018-01-31 DIAGNOSIS — M170 Bilateral primary osteoarthritis of knee: Secondary | ICD-10-CM | POA: Diagnosis not present

## 2018-01-31 DIAGNOSIS — I420 Dilated cardiomyopathy: Secondary | ICD-10-CM | POA: Diagnosis not present

## 2018-01-31 DIAGNOSIS — I447 Left bundle-branch block, unspecified: Secondary | ICD-10-CM | POA: Diagnosis not present

## 2018-01-31 DIAGNOSIS — I5023 Acute on chronic systolic (congestive) heart failure: Secondary | ICD-10-CM | POA: Diagnosis not present

## 2018-01-31 DIAGNOSIS — I4891 Unspecified atrial fibrillation: Secondary | ICD-10-CM | POA: Diagnosis not present

## 2018-01-31 DIAGNOSIS — I25810 Atherosclerosis of coronary artery bypass graft(s) without angina pectoris: Secondary | ICD-10-CM | POA: Diagnosis not present

## 2018-01-31 DIAGNOSIS — I714 Abdominal aortic aneurysm, without rupture: Secondary | ICD-10-CM | POA: Diagnosis not present

## 2018-01-31 DIAGNOSIS — D631 Anemia in chronic kidney disease: Secondary | ICD-10-CM | POA: Diagnosis not present

## 2018-01-31 DIAGNOSIS — M4807 Spinal stenosis, lumbosacral region: Secondary | ICD-10-CM | POA: Diagnosis not present

## 2018-01-31 DIAGNOSIS — M1612 Unilateral primary osteoarthritis, left hip: Secondary | ICD-10-CM | POA: Diagnosis not present

## 2018-01-31 DIAGNOSIS — I130 Hypertensive heart and chronic kidney disease with heart failure and stage 1 through stage 4 chronic kidney disease, or unspecified chronic kidney disease: Secondary | ICD-10-CM | POA: Diagnosis not present

## 2018-01-31 DIAGNOSIS — N183 Chronic kidney disease, stage 3 (moderate): Secondary | ICD-10-CM | POA: Diagnosis not present

## 2018-02-03 ENCOUNTER — Ambulatory Visit (INDEPENDENT_AMBULATORY_CARE_PROVIDER_SITE_OTHER): Payer: Medicare Other | Admitting: Family Medicine

## 2018-02-03 ENCOUNTER — Encounter: Payer: Self-pay | Admitting: Family Medicine

## 2018-02-03 VITALS — BP 99/64 | HR 71 | Wt 193.0 lb

## 2018-02-03 DIAGNOSIS — E782 Mixed hyperlipidemia: Secondary | ICD-10-CM

## 2018-02-03 DIAGNOSIS — M6281 Muscle weakness (generalized): Secondary | ICD-10-CM

## 2018-02-03 DIAGNOSIS — S76111D Strain of right quadriceps muscle, fascia and tendon, subsequent encounter: Secondary | ICD-10-CM | POA: Diagnosis not present

## 2018-02-03 DIAGNOSIS — I481 Persistent atrial fibrillation: Secondary | ICD-10-CM

## 2018-02-03 DIAGNOSIS — D696 Thrombocytopenia, unspecified: Secondary | ICD-10-CM | POA: Diagnosis not present

## 2018-02-03 DIAGNOSIS — R259 Unspecified abnormal involuntary movements: Secondary | ICD-10-CM | POA: Diagnosis not present

## 2018-02-03 DIAGNOSIS — T733XXD Exhaustion due to excessive exertion, subsequent encounter: Secondary | ICD-10-CM

## 2018-02-03 DIAGNOSIS — M48061 Spinal stenosis, lumbar region without neurogenic claudication: Secondary | ICD-10-CM | POA: Diagnosis not present

## 2018-02-03 DIAGNOSIS — L84 Corns and callosities: Secondary | ICD-10-CM

## 2018-02-03 DIAGNOSIS — Z7901 Long term (current) use of anticoagulants: Secondary | ICD-10-CM | POA: Diagnosis not present

## 2018-02-03 DIAGNOSIS — I5032 Chronic diastolic (congestive) heart failure: Secondary | ICD-10-CM | POA: Diagnosis not present

## 2018-02-03 DIAGNOSIS — D508 Other iron deficiency anemias: Secondary | ICD-10-CM | POA: Diagnosis not present

## 2018-02-03 DIAGNOSIS — L602 Onychogryphosis: Secondary | ICD-10-CM

## 2018-02-03 DIAGNOSIS — I4819 Other persistent atrial fibrillation: Secondary | ICD-10-CM

## 2018-02-03 DIAGNOSIS — Z5181 Encounter for therapeutic drug level monitoring: Secondary | ICD-10-CM

## 2018-02-03 DIAGNOSIS — R0609 Other forms of dyspnea: Secondary | ICD-10-CM

## 2018-02-03 MED ORDER — AMBULATORY NON FORMULARY MEDICATION: 0 refills | Status: DC

## 2018-02-03 NOTE — Patient Instructions (Signed)
Thank you for coming in today. Recheck in 1 month.  Return sooner if needed.  You should hear about CPAP.  Let me know if you do not hear anything.

## 2018-02-03 NOTE — Progress Notes (Signed)
[name] [name] is a 84 y.o. male who presents to [name]: Primary Care Sports Medicine today for  Follow-up weakness, heart failure, discuss callus right foot and long toenails, and warfarin.  [name] [name] has a history of fatigue weakness and impaired mobility due to a combination of deconditioning, heart failure, Parkinson's disease and DJD.  He has been seen by his neurologist recently who increased his Sinemet dose to 3 times daily and referred to specialized home health physical therapy for Parkinson's disease.  [name] [name] does not think this has helped much and notes continued problems with mobility at home.  He follows up with the neurologist in the near future.  Continues to use a Straker to ambulate but has trouble completing activities of daily living.  Additionally [name] [name] notes a callus on his right foot that is bleeding but nonpainful.  Additionally he notes a long toenails.  He has been referred to podiatry in the past but has trouble affording the copayments to his appointments.  He denies any injury to his feet.  He has not had any treatment recently.  Warfarin: [name] [name] takes warfarin chronically for atrial fibrillation as well as significant heart failure.  He has had several falls in the past and has worsening mobility.  He denies any excessive bleeding.  He follows up with his cardiologist in the near future and wonders if it makes sense to continue warfarin in the setting.   ROS as above:  Exam:  BP 99/64   Pulse 71   Wt 193 lb (87.5 kg)   BMI 23.50 kg/m  Gen: Well NAD HEENT: EOMI,  MMM Lungs: Normal work of breathing. CTABL Heart: Irregular  Faint heart sounds Abd: NABS, Soft. Nondistended, Nontender Exts: Brisk capillary refill, warm and well perfused.  Nonedematous Feet bilaterally have long thickened toenails.  Additionally there is a callus with some dried blood on the plantar  aspect of his right foot underlying his fifth MCP.  This is nontender.  No skin breakdown is present.  Toenails trimmed.  Slight bleeding with left second toenail.    Assessment and Plan: 84 y.o. male with  Fatigue and impaired mobility.  Multifactorial due to deconditioning DJD Parkinson's disease and heart failure.  [name] is slowly losing his remaining independence and functional capacity despite good multidisciplinary medical care.  I think continuing with physical therapy is reasonable and I anticipate feedback from neurology and cardiology but I am not optimistic about his long-term ability to remain independent.  I plan to the conversation with his family about potentially hospice however in the past this conversation has not been well received.  Recheck in 1 month.  Return sooner if needed.  Check basic labs listed below.  Callus right foot: Unclear if there is any pressure injury underneath the callus however that does not look likely.  I have modified his footwear to include a 5th ray post with a float underneath the fifth MCP.  Patient functionally is not able to go to podiatry easily.  Will consider referral in the future if I am not able to manage this myself.    Longer toenails: Trimmed back today.  Anticoagulation: I believe that the risk of warfarin is now awaiting the benefit.  Anticipate follow-up with cardiology.  I would favor discontinuing warfarin.  I spent 40 minutes with this patient, greater than 50% was face-to-face time counseling regarding ddx and plan.     Orders Placed This Encounter  Procedures  .  CBC  . COMPLETE METABOLIC PANEL WITH GFR  . Fe+TIBC+Fer  . INR/PT  . Digoxin level   Meds ordered this encounter  Medications  . AMBULATORY NON FORMULARY MEDICATION    Sig: Continuous positive airway pressure (CPAP) machine auto-titrate from 4-20 cm of H2O pressure, with all supplemental supplies as needed. [facility] if able.    Dispense:  1 each    Refill:   0     Historical information moved to improve visibility of documentation.  Past Medical History:  Diagnosis Date  . Anemia, unspecified 04/07/2013  . Atrial fibrillation (HCC)    fibrillation/flutter  . BPH (benign prostatic hypertrophy)   . Chronic systolic CHF (congestive heart failure) (HCC)    Nonischemic DCM EF 10% s/p BiV AICD  . Coronary artery disease 2008   nonobstructive ASCAD  . Depression   . Hyperlipidemia   . Hypertension   . Hypothyroidism   . Left bundle branch block   . Nonischemic cardiomyopathy (HCC)    ef 10 %  . OSA (obstructive sleep apnea)    intolerant to CPAP  . Osteoarthritis   . Sleep apnea    stopped using CPAP  . Thrombocytopenia (HCC)    Past Surgical History:  Procedure Laterality Date  . CARDIAC DEFIBRILLATOR PLACEMENT     left chest  . CARDIOVERSION N/A 12/29/2012   Procedure: CARDIOVERSION;  Surgeon: Quintella Reichert, MD;  Location: [facility];  Service: Cardiovascular;  Laterality: N/A;  . CHOLECYSTECTOMY    . Colonscopy     reportedly negative per pt; around 2005.    . EP IMPLANTABLE DEVICE N/A 05/09/2016   Procedure: BIVI ICD Generator Changeout;  Surgeon: Duke Salvia, MD;  Location: [facility] INVASIVE CV LAB;  Service: Cardiovascular;  Laterality: N/A;  . TRANSURETHRAL RESECTION OF PROSTATE     for BPH   Social History   Tobacco Use  . Smoking status: Never Smoker  . Smokeless tobacco: Never Used  Substance Use Topics  . Alcohol use: No   family history includes Cancer in his maternal uncle; Heart disease in his father; Heart failure in his mother; Stroke in his mother.  Medications: Current Outpatient Medications  Medication Sig Dispense Refill  . acetaminophen (TYLENOL) 650 MG CR tablet Take 1 tablet (650 mg total) by mouth every 8 (eight) hours as needed for pain. 90 tablet 3  . AMBULATORY NON FORMULARY MEDICATION Continuous positive airway pressure (CPAP) machine auto-titrate from 4-20 cm of H2O pressure, with all  supplemental supplies as needed. [facility] if able. 1 each 0  . carbidopa-levodopa (SINEMET) 25-100 MG tablet Take 1 tablet by mouth 3 (three) times daily.    . diclofenac sodium (VOLTAREN) 1 % GEL Apply 4 g topically 4 (four) times daily. To affected joint. 1440 g 3  . digoxin (DIGOX) 0.125 MG tablet Take 1 tablet (125 mcg total) by mouth every other day. 45 tablet 2  . levothyroxine (SYNTHROID, LEVOTHROID) 137 MCG tablet TAKE 1 TABLET BY MOUTH  DAILY BEFORE BREAKFAST 90 tablet 1  . mupirocin ointment (BACTROBAN) 2 % Apply to affected area BID for 7 days. 30 g 3  . nystatin cream (MYCOSTATIN) Apply 1 application topically 2 (two) times daily. 30 g 1  . Nystatin POWD Apply liberally to affected area 2 times per day 1 Bottle 11  . potassium chloride SA (K-DUR,KLOR-CON) 20 MEQ tablet Take 1 tablet (20 mEq total) by mouth daily. 90 tablet 3  . simvastatin (ZOCOR) 10 MG tablet TAKE 1 TABLET BY  MOUTH  EVERY EVENING 90 tablet 3  . spironolactone (ALDACTONE) 25 MG tablet TAKE ONE-HALF TABLET BY  MOUTH DAILY 45 tablet 2  . torsemide (DEMADEX) 20 MG tablet Take 1 tablet (20 mg total) by mouth daily. 30 tablet 0  . warfarin (COUMADIN) 1 MG tablet Take 1mg  pill in addition to 5mg  pill on Monday, Wed, Friday 90 tablet 1  . warfarin (COUMADIN) 5 MG tablet Take 1 tablet (5 mg total) by mouth daily at 6 PM. 30 tablet 0   No current facility-administered medications for this visit.    Allergies  Allergen Reactions  . Amiodarone Other (See Comments)    Neuro toxicity     Discussed warning signs or symptoms. Please see discharge instructions. Patient expresses understanding.

## 2018-02-04 ENCOUNTER — Telehealth: Payer: Self-pay | Admitting: Family Medicine

## 2018-02-04 DIAGNOSIS — Z951 Presence of aortocoronary bypass graft: Secondary | ICD-10-CM | POA: Diagnosis not present

## 2018-02-04 DIAGNOSIS — E785 Hyperlipidemia, unspecified: Secondary | ICD-10-CM | POA: Diagnosis not present

## 2018-02-04 DIAGNOSIS — I4891 Unspecified atrial fibrillation: Secondary | ICD-10-CM | POA: Diagnosis not present

## 2018-02-04 DIAGNOSIS — E039 Hypothyroidism, unspecified: Secondary | ICD-10-CM | POA: Diagnosis not present

## 2018-02-04 DIAGNOSIS — Z95810 Presence of automatic (implantable) cardiac defibrillator: Secondary | ICD-10-CM | POA: Diagnosis not present

## 2018-02-04 DIAGNOSIS — N183 Chronic kidney disease, stage 3 (moderate): Secondary | ICD-10-CM | POA: Diagnosis not present

## 2018-02-04 DIAGNOSIS — I517 Cardiomegaly: Secondary | ICD-10-CM | POA: Diagnosis not present

## 2018-02-04 DIAGNOSIS — I4510 Unspecified right bundle-branch block: Secondary | ICD-10-CM | POA: Diagnosis not present

## 2018-02-04 DIAGNOSIS — I10 Essential (primary) hypertension: Secondary | ICD-10-CM | POA: Diagnosis not present

## 2018-02-04 DIAGNOSIS — I482 Chronic atrial fibrillation: Secondary | ICD-10-CM | POA: Diagnosis not present

## 2018-02-04 DIAGNOSIS — G20 Parkinson's disease: Secondary | ICD-10-CM | POA: Diagnosis not present

## 2018-02-04 DIAGNOSIS — I130 Hypertensive heart and chronic kidney disease with heart failure and stage 1 through stage 4 chronic kidney disease, or unspecified chronic kidney disease: Secondary | ICD-10-CM | POA: Diagnosis not present

## 2018-02-04 DIAGNOSIS — R531 Weakness: Secondary | ICD-10-CM | POA: Diagnosis not present

## 2018-02-04 DIAGNOSIS — I2510 Atherosclerotic heart disease of native coronary artery without angina pectoris: Secondary | ICD-10-CM | POA: Diagnosis not present

## 2018-02-04 DIAGNOSIS — Z79899 Other long term (current) drug therapy: Secondary | ICD-10-CM | POA: Diagnosis not present

## 2018-02-04 DIAGNOSIS — L308 Other specified dermatitis: Secondary | ICD-10-CM | POA: Diagnosis not present

## 2018-02-04 DIAGNOSIS — I5022 Chronic systolic (congestive) heart failure: Secondary | ICD-10-CM | POA: Diagnosis not present

## 2018-02-04 DIAGNOSIS — R5383 Other fatigue: Secondary | ICD-10-CM | POA: Diagnosis not present

## 2018-02-04 DIAGNOSIS — E1165 Type 2 diabetes mellitus with hyperglycemia: Secondary | ICD-10-CM | POA: Diagnosis not present

## 2018-02-04 DIAGNOSIS — E1122 Type 2 diabetes mellitus with diabetic chronic kidney disease: Secondary | ICD-10-CM | POA: Diagnosis not present

## 2018-02-04 DIAGNOSIS — G4733 Obstructive sleep apnea (adult) (pediatric): Secondary | ICD-10-CM | POA: Diagnosis not present

## 2018-02-04 DIAGNOSIS — Z87891 Personal history of nicotine dependence: Secondary | ICD-10-CM | POA: Diagnosis not present

## 2018-02-04 LAB — COMPLETE METABOLIC PANEL WITH GFR
AG Ratio: 1.7 (calc) (ref 1.0–2.5)
ALT: 11 U/L (ref 9–46)
AST: 13 U/L (ref 10–35)
Albumin: 4.1 g/dL (ref 3.6–5.1)
Alkaline phosphatase (APISO): 135 U/L — ABNORMAL HIGH (ref 40–115)
BUN/Creatinine Ratio: 15 (calc) (ref 6–22)
BUN: 19 mg/dL (ref 7–25)
CHLORIDE: 97 mmol/L — AB (ref 98–110)
CO2: 28 mmol/L (ref 20–32)
Calcium: 9.1 mg/dL (ref 8.6–10.3)
Creat: 1.26 mg/dL — ABNORMAL HIGH (ref 0.70–1.11)
GFR, Est African American: 61 mL/min/{1.73_m2} (ref >=60)
GFR, Est Non African American: 52 mL/min/{1.73_m2} — ABNORMAL LOW (ref >=60)
GLUCOSE: 588 mg/dL — AB (ref 65–99)
Globulin: 2.4 g/dL (calc) (ref 1.9–3.7)
Potassium: 4.2 mmol/L (ref 3.5–5.3)
Sodium: 136 mmol/L (ref 135–146)
Total Bilirubin: 0.6 mg/dL (ref 0.2–1.2)
Total Protein: 6.5 g/dL (ref 6.1–8.1)

## 2018-02-04 LAB — IRON,TIBC AND FERRITIN PANEL
%SAT: 23 % (ref 20–48)
FERRITIN: 370 ng/mL (ref 24–380)
IRON: 55 ug/dL (ref 50–180)
TIBC: 244 ug/dL — AB (ref 250–425)

## 2018-02-04 LAB — CBC
HCT: 40.4 % (ref 38.5–50.0)
Hemoglobin: 13.2 g/dL (ref 13.2–17.1)
MCH: 30.9 pg (ref 27.0–33.0)
MCHC: 32.7 g/dL (ref 32.0–36.0)
MCV: 94.6 fL (ref 80.0–100.0)
MPV: 12.6 fL — AB (ref 7.5–12.5)
Platelets: 103 10*3/uL — ABNORMAL LOW (ref 140–400)
RBC: 4.27 10*6/uL (ref 4.20–5.80)
RDW: 11.9 % (ref 11.0–15.0)
WBC: 6.7 10*3/uL (ref 3.8–10.8)

## 2018-02-04 LAB — DIGOXIN LEVEL: Digoxin Level: <0.5 mcg/L — ABNORMAL LOW (ref 0.8–2.0)

## 2018-02-04 LAB — PROTIME-INR
INR: 1.8 — ABNORMAL HIGH
Prothrombin Time: 19.4 s — ABNORMAL HIGH (ref 9.0–11.5)

## 2018-02-04 NOTE — Telephone Encounter (Signed)
Spoke with Triage Nurse Barth Kirks at [facility] ED.  Gave heads up and faxed facesheet, last note and last labs.

## 2018-02-05 ENCOUNTER — Telehealth: Payer: Self-pay | Admitting: Family Medicine

## 2018-02-05 DIAGNOSIS — N183 Chronic kidney disease, stage 3 (moderate): Secondary | ICD-10-CM | POA: Diagnosis not present

## 2018-02-05 DIAGNOSIS — E119 Type 2 diabetes mellitus without complications: Secondary | ICD-10-CM | POA: Diagnosis not present

## 2018-02-05 DIAGNOSIS — I482 Chronic atrial fibrillation: Secondary | ICD-10-CM | POA: Diagnosis not present

## 2018-02-05 DIAGNOSIS — I2510 Atherosclerotic heart disease of native coronary artery without angina pectoris: Secondary | ICD-10-CM | POA: Diagnosis not present

## 2018-02-05 DIAGNOSIS — I5022 Chronic systolic (congestive) heart failure: Secondary | ICD-10-CM | POA: Diagnosis not present

## 2018-02-05 LAB — HEMOGLOBIN A1C: HEMOGLOBIN A1C: 12.3 — AB (ref 4.0–6.0)

## 2018-02-05 MED ORDER — NYSTATIN 100000 UNIT/GM EX CREA: 1.0000 "application " | TOPICAL_CREAM | Freq: Two times a day (BID) | CUTANEOUS | 2 refills | Status: DC

## 2018-02-05 MED ORDER — ACETAMINOPHEN 325 MG PO TABS
650.00 | ORAL_TABLET | ORAL | Status: DC
Start: ? — End: 2018-02-05

## 2018-02-05 MED ORDER — GENERIC EXTERNAL MEDICATION
137.00 | Status: DC
Start: 2018-02-05 — End: 2018-02-05

## 2018-02-05 MED ORDER — GENERIC EXTERNAL MEDICATION
10.00 | Status: DC
Start: ? — End: 2018-02-05

## 2018-02-05 MED ORDER — DICLOFENAC SODIUM 1 % TD GEL
4.00 | TRANSDERMAL | Status: DC
Start: ? — End: 2018-02-05

## 2018-02-05 MED ORDER — WARFARIN SODIUM 5 MG PO TABS
5.00 | ORAL_TABLET | ORAL | Status: DC
Start: 2018-02-05 — End: 2018-02-05

## 2018-02-05 MED ORDER — NITROGLYCERIN 0.4 MG SL SUBL
.40 | SUBLINGUAL_TABLET | SUBLINGUAL | Status: DC
Start: ? — End: 2018-02-05

## 2018-02-05 MED ORDER — DIGOXIN 125 MCG PO TABS
.13 | ORAL_TABLET | ORAL | Status: DC
Start: 2018-02-06 — End: 2018-02-05

## 2018-02-05 MED ORDER — CARBIDOPA-LEVODOPA 25-100 MG PO TABS
1.00 | ORAL_TABLET | ORAL | Status: DC
Start: 2018-02-05 — End: 2018-02-05

## 2018-02-05 MED ORDER — ALUM & MAG HYDROXIDE-SIMETH 200-200-20 MG/5ML PO SUSP
30.00 | ORAL | Status: DC
Start: ? — End: 2018-02-05

## 2018-02-05 MED ORDER — SODIUM CHLORIDE 0.9 % IV SOLN
10.00 | INTRAVENOUS | Status: DC
Start: ? — End: 2018-02-05

## 2018-02-05 MED ORDER — TORSEMIDE 20 MG PO TABS
20.00 | ORAL_TABLET | ORAL | Status: DC
Start: 2018-02-06 — End: 2018-02-05

## 2018-02-05 MED ORDER — PRAVASTATIN SODIUM 10 MG PO TABS
20.00 | ORAL_TABLET | ORAL | Status: DC
Start: 2018-02-05 — End: 2018-02-05

## 2018-02-05 MED ORDER — GLYBURIDE 5 MG PO TABS
5.00 | ORAL_TABLET | ORAL | Status: DC
Start: 2018-02-05 — End: 2018-02-05

## 2018-02-05 MED ORDER — WARFARIN SODIUM 1 MG PO TABS
ORAL_TABLET | ORAL | Status: DC
Start: ? — End: 2018-02-05

## 2018-02-05 NOTE — Telephone Encounter (Signed)
I spoke with [name] about [name] [name]. He has a yeast rash in groin. Will send nystatin to pharmacy. Spoke about [name]'s hospitalization.

## 2018-02-06 DIAGNOSIS — I482 Chronic atrial fibrillation: Secondary | ICD-10-CM | POA: Diagnosis not present

## 2018-02-06 DIAGNOSIS — N183 Chronic kidney disease, stage 3 (moderate): Secondary | ICD-10-CM | POA: Diagnosis not present

## 2018-02-06 DIAGNOSIS — I5022 Chronic systolic (congestive) heart failure: Secondary | ICD-10-CM | POA: Diagnosis not present

## 2018-02-06 DIAGNOSIS — E119 Type 2 diabetes mellitus without complications: Secondary | ICD-10-CM | POA: Diagnosis not present

## 2018-02-06 DIAGNOSIS — I2510 Atherosclerotic heart disease of native coronary artery without angina pectoris: Secondary | ICD-10-CM | POA: Diagnosis not present

## 2018-02-07 ENCOUNTER — Telehealth: Payer: Self-pay

## 2018-02-07 DIAGNOSIS — M170 Bilateral primary osteoarthritis of knee: Secondary | ICD-10-CM | POA: Diagnosis not present

## 2018-02-07 DIAGNOSIS — I5023 Acute on chronic systolic (congestive) heart failure: Secondary | ICD-10-CM | POA: Diagnosis not present

## 2018-02-07 DIAGNOSIS — I714 Abdominal aortic aneurysm, without rupture: Secondary | ICD-10-CM | POA: Diagnosis not present

## 2018-02-07 DIAGNOSIS — M1612 Unilateral primary osteoarthritis, left hip: Secondary | ICD-10-CM | POA: Diagnosis not present

## 2018-02-07 DIAGNOSIS — I130 Hypertensive heart and chronic kidney disease with heart failure and stage 1 through stage 4 chronic kidney disease, or unspecified chronic kidney disease: Secondary | ICD-10-CM | POA: Diagnosis not present

## 2018-02-07 DIAGNOSIS — N183 Chronic kidney disease, stage 3 (moderate): Secondary | ICD-10-CM | POA: Diagnosis not present

## 2018-02-07 DIAGNOSIS — I447 Left bundle-branch block, unspecified: Secondary | ICD-10-CM | POA: Diagnosis not present

## 2018-02-07 DIAGNOSIS — D631 Anemia in chronic kidney disease: Secondary | ICD-10-CM | POA: Diagnosis not present

## 2018-02-07 DIAGNOSIS — I4891 Unspecified atrial fibrillation: Secondary | ICD-10-CM | POA: Diagnosis not present

## 2018-02-07 DIAGNOSIS — I420 Dilated cardiomyopathy: Secondary | ICD-10-CM | POA: Diagnosis not present

## 2018-02-07 DIAGNOSIS — M4807 Spinal stenosis, lumbosacral region: Secondary | ICD-10-CM | POA: Diagnosis not present

## 2018-02-07 DIAGNOSIS — I25810 Atherosclerosis of coronary artery bypass graft(s) without angina pectoris: Secondary | ICD-10-CM | POA: Diagnosis not present

## 2018-02-07 NOTE — Telephone Encounter (Signed)
[name] [name] needs refills for the glyburide and metformin. Added to list. He will need a prescription on Tuesday during his visit. I did put the GoodRx coupon on the prescription for the glyburide and pended prescription.

## 2018-02-10 ENCOUNTER — Ambulatory Visit: Payer: Medicare Other | Admitting: Internal Medicine

## 2018-02-10 ENCOUNTER — Encounter: Payer: Self-pay | Admitting: Internal Medicine

## 2018-02-10 VITALS — BP 110/60 | HR 73 | Ht 75.0 in | Wt 193.0 lb

## 2018-02-10 DIAGNOSIS — Z95810 Presence of automatic (implantable) cardiac defibrillator: Secondary | ICD-10-CM

## 2018-02-10 DIAGNOSIS — M170 Bilateral primary osteoarthritis of knee: Secondary | ICD-10-CM | POA: Diagnosis not present

## 2018-02-10 DIAGNOSIS — I5042 Chronic combined systolic (congestive) and diastolic (congestive) heart failure: Secondary | ICD-10-CM | POA: Diagnosis not present

## 2018-02-10 DIAGNOSIS — I428 Other cardiomyopathies: Secondary | ICD-10-CM

## 2018-02-10 DIAGNOSIS — I4891 Unspecified atrial fibrillation: Secondary | ICD-10-CM | POA: Diagnosis not present

## 2018-02-10 DIAGNOSIS — I480 Paroxysmal atrial fibrillation: Secondary | ICD-10-CM

## 2018-02-10 DIAGNOSIS — I447 Left bundle-branch block, unspecified: Secondary | ICD-10-CM | POA: Diagnosis not present

## 2018-02-10 DIAGNOSIS — I714 Abdominal aortic aneurysm, without rupture: Secondary | ICD-10-CM | POA: Diagnosis not present

## 2018-02-10 DIAGNOSIS — I130 Hypertensive heart and chronic kidney disease with heart failure and stage 1 through stage 4 chronic kidney disease, or unspecified chronic kidney disease: Secondary | ICD-10-CM | POA: Diagnosis not present

## 2018-02-10 DIAGNOSIS — I5023 Acute on chronic systolic (congestive) heart failure: Secondary | ICD-10-CM | POA: Diagnosis not present

## 2018-02-10 DIAGNOSIS — M1612 Unilateral primary osteoarthritis, left hip: Secondary | ICD-10-CM | POA: Diagnosis not present

## 2018-02-10 DIAGNOSIS — N183 Chronic kidney disease, stage 3 (moderate): Secondary | ICD-10-CM | POA: Diagnosis not present

## 2018-02-10 DIAGNOSIS — M4807 Spinal stenosis, lumbosacral region: Secondary | ICD-10-CM | POA: Diagnosis not present

## 2018-02-10 DIAGNOSIS — I25810 Atherosclerosis of coronary artery bypass graft(s) without angina pectoris: Secondary | ICD-10-CM | POA: Diagnosis not present

## 2018-02-10 DIAGNOSIS — D631 Anemia in chronic kidney disease: Secondary | ICD-10-CM | POA: Diagnosis not present

## 2018-02-10 DIAGNOSIS — I420 Dilated cardiomyopathy: Secondary | ICD-10-CM | POA: Diagnosis not present

## 2018-02-10 NOTE — Telephone Encounter (Signed)
Noted. I do not see a D/C summary yet in care everywhere. Will hold off on medication orders until his follow up tomorrow.

## 2018-02-10 NOTE — Patient Instructions (Addendum)
Medication Instructions:  Your physician recommends that you continue on your current medications as directed. Please refer to the Current Medication list given to you today.  Labwork: None ordered.  Testing/Procedures: None ordered.  Follow-Up: Your physician recommends that you schedule a follow-up appointment in: 6 months with Francis Dowse, PA  Remote monitoring is used to monitor your Pacemaker of ICD from home. This monitoring reduces the number of office visits required to check your device to one time per year. It allows [place] to keep an eye on the functioning of your device to ensure it is working properly. You are scheduled for a device check from home on 02/19/2018. You may send your transmission at any time that day. If you have a wireless device, the transmission will be sent automatically. After your physician reviews your transmission, you will receive a postcard with your next transmission date.   Any Other Special Instructions Will Be Listed Below (If Applicable).     If you need a refill on your cardiac medications before your next appointment, please call your pharmacy.

## 2018-02-10 NOTE — Progress Notes (Signed)
Patient Care Team: Rodolph Bong, MD as PCP - General (Family Medicine) Quintella Reichert, MD as Consulting Physician (Cardiology)   HPI  [name] [name] is a 84 y.o. male Seen in followup for an ICD implanted for primary prevention in the setting of nonischemic cardiomyopathy identified and confirmed by catheterization in 2008. Because of symptoms of heart failure and left bundle branch block he underwent CRT-D implantation with a Medtronic device and a 6947 defibrillator lead. He had a lazerus-like effect.   He also has a history of atrial fibrillation For this he was treated with amiodarone. Surveillance labs   Normal  But he develped gait instability and med was stopped     DATE TEST EF   8/14 TTE   EF 25-30 %   2/16 TTE   EF 35-40 %   8/17 TTE EF 35%   5/18 Echo  35-40%    Date Cr K Dig Hgb  1/19 1.34 4.3  13.4  8/19  1.26 4.2 <0.5 13.2   Continues to have problem with weakness in his legs with prolonged standing or modest walking.  His blood pressure remains low not withstanding the fact that we discontinued a bunch of his medications last time.  Unfortunately, we were not able to get orthostatics today.  We will arrange for these at his PCP tomorrow.  There has been about a 20 pound weight loss over the last 18 months.  Recently hospitalized for a blood sugar of 500.    Past Medical History:  Diagnosis Date  . Anemia, unspecified 04/07/2013  . Atrial fibrillation (HCC)    fibrillation/flutter  . BPH (benign prostatic hypertrophy)   . Chronic systolic CHF (congestive heart failure) (HCC)    Nonischemic DCM EF 10% s/p BiV AICD  . Coronary artery disease 2008   nonobstructive ASCAD  . Depression   . Hyperlipidemia   . Hypertension   . Hypothyroidism   . Left bundle branch block   . Nonischemic cardiomyopathy (HCC)    ef 10 %  . OSA (obstructive sleep apnea)    intolerant to CPAP  . Osteoarthritis   . Sleep apnea    stopped using CPAP  . Thrombocytopenia  (HCC)     Past Surgical History:  Procedure Laterality Date  . CARDIAC DEFIBRILLATOR PLACEMENT     left chest  . CARDIOVERSION N/A 12/29/2012   Procedure: CARDIOVERSION;  Surgeon: Quintella Reichert, MD;  Location: [facility];  Service: Cardiovascular;  Laterality: N/A;  . CHOLECYSTECTOMY    . Colonscopy     reportedly negative per pt; around 2005.    . EP IMPLANTABLE DEVICE N/A 05/09/2016   Procedure: BIVI ICD Generator Changeout;  Surgeon: Duke Salvia, MD;  Location: [facility] INVASIVE CV LAB;  Service: Cardiovascular;  Laterality: N/A;  . TRANSURETHRAL RESECTION OF PROSTATE     for BPH    Current Outpatient Medications  Medication Sig Dispense Refill  . acetaminophen (TYLENOL) 650 MG CR tablet Take 1 tablet (650 mg total) by mouth every 8 (eight) hours as needed for pain. 90 tablet 3  . AMBULATORY NON FORMULARY MEDICATION Continuous positive airway pressure (CPAP) machine auto-titrate from 4-20 cm of H2O pressure, with all supplemental supplies as needed. [employer]ocare if able. 1 each 0  . carbidopa-levodopa (SINEMET) 25-100 MG tablet Take 1 tablet by mouth 3 (three) times daily.    . diclofenac sodium (VOLTAREN) 1 % GEL Apply 4 g topically 4 (four) times daily. To affected  joint. 1440 g 3  . digoxin (DIGOX) 0.125 MG tablet Take 1 tablet (125 mcg total) by mouth every other day. 45 tablet 2  . glyBURIDE (DIABETA) 5 MG tablet Take 5 mg by mouth 2 (two) times daily with a meal.    . levothyroxine (SYNTHROID, LEVOTHROID) 137 MCG tablet TAKE 1 TABLET BY MOUTH  DAILY BEFORE BREAKFAST 90 tablet 1  . metFORMIN (GLUCOPHAGE) 500 MG tablet Take by mouth 2 (two) times daily with a meal.    . mupirocin ointment (BACTROBAN) 2 % Apply to affected area BID for 7 days. 30 g 3  . nystatin cream (MYCOSTATIN) Apply 1 application topically 2 (two) times daily. 60 g 2  . Nystatin POWD Apply liberally to affected area 2 times per day 1 Bottle 11  . NYSTATIN powder APPLY LIBERALLY TO AFFECTED AREA(S) TWICE DAILY  11    . potassium chloride SA (K-DUR,KLOR-CON) 20 MEQ tablet Take 1 tablet (20 mEq total) by mouth daily. 90 tablet 3  . simvastatin (ZOCOR) 10 MG tablet TAKE 1 TABLET BY MOUTH  EVERY EVENING 90 tablet 3  . spironolactone (ALDACTONE) 25 MG tablet TAKE ONE-HALF TABLET BY  MOUTH DAILY 45 tablet 2  . torsemide (DEMADEX) 20 MG tablet Take 1 tablet (20 mg total) by mouth daily. 30 tablet 0  . warfarin (COUMADIN) 1 MG tablet Take 1mg  pill in addition to 5mg  pill on Monday, Wed, Friday 90 tablet 1  . warfarin (COUMADIN) 5 MG tablet Take 1 tablet (5 mg total) by mouth daily at 6 PM. 30 tablet 0   No current facility-administered medications for this visit.     Allergies  Allergen Reactions  . Amiodarone Other (See Comments)    Neuro toxicity    Review of Systems negative except from HPI and PMH  Physical Exam BP 110/60   Pulse 73   Ht 6\' 3"  (1.905 m)   Wt 193 lb (87.5 kg)   SpO2 96%   BMI 24.12 kg/m  Well developed and nourished in no acute distress HENT normal Neck supple with JVP-flat Clear Regular rate and rhythm, 2/6 m Abd-soft with active BS No Clubbing cyanosis edema Skin-warm and dry A & Oriented  Grossly normal sensory and motor function   ECG Vpacing with atrial fibrillation QRS upright lead V1 and neg Assessment and  Plan  Nonischemic cardiomyopathy   Congestive heart failure-chronic-systolic   Atrial fibrillation-permanent  Atrial tachycardia  Monitoring for high-risk medications-   Orthostatic intolerance  Implantable defibrillator-CRT-Medtronic  The patient's device was interrogated.  The information was reviewed. No changes were made in the programming.    His BP remains low,  Will ask PCP to do orthostatics tomorrow with prolonged standing   He may benefit from BP support   Euvolemic continue current meds  Atrial fib permanent  His PCP raised with family regarding stopping anticoagulation Data suggests falls need to be> 100 /yr to justify stopping  anticoagulation   We spent more than 50% of our >25 min visit in face to face counseling regarding the above

## 2018-02-11 ENCOUNTER — Encounter: Payer: Self-pay | Admitting: Family Medicine

## 2018-02-11 ENCOUNTER — Ambulatory Visit (INDEPENDENT_AMBULATORY_CARE_PROVIDER_SITE_OTHER): Payer: Medicare Other | Admitting: Family Medicine

## 2018-02-11 VITALS — BP 108/69 | HR 77 | Wt 196.0 lb

## 2018-02-11 DIAGNOSIS — I428 Other cardiomyopathies: Secondary | ICD-10-CM

## 2018-02-11 DIAGNOSIS — E1129 Type 2 diabetes mellitus with other diabetic kidney complication: Secondary | ICD-10-CM | POA: Insufficient documentation

## 2018-02-11 DIAGNOSIS — I25708 Atherosclerosis of coronary artery bypass graft(s), unspecified, with other forms of angina pectoris: Secondary | ICD-10-CM

## 2018-02-11 DIAGNOSIS — Z7901 Long term (current) use of anticoagulants: Secondary | ICD-10-CM | POA: Diagnosis not present

## 2018-02-11 DIAGNOSIS — I481 Persistent atrial fibrillation: Secondary | ICD-10-CM | POA: Diagnosis not present

## 2018-02-11 DIAGNOSIS — E1165 Type 2 diabetes mellitus with hyperglycemia: Secondary | ICD-10-CM | POA: Diagnosis not present

## 2018-02-11 DIAGNOSIS — I4819 Other persistent atrial fibrillation: Secondary | ICD-10-CM

## 2018-02-11 DIAGNOSIS — I10 Essential (primary) hypertension: Secondary | ICD-10-CM

## 2018-02-11 MED ORDER — METFORMIN HCL 500 MG PO TABS: 500.0000 mg | ORAL_TABLET | Freq: Two times a day (BID) | ORAL | 1 refills | Status: DC

## 2018-02-11 MED ORDER — GLIMEPIRIDE 2 MG PO TABS: 2.0000 mg | ORAL_TABLET | Freq: Every day | ORAL | 3 refills | Status: DC

## 2018-02-11 MED ORDER — AMBULATORY NON FORMULARY MEDICATION: 0 refills | Status: DC

## 2018-02-11 MED ORDER — AMBULATORY NON FORMULARY MEDICATION: 12 refills | Status: AC

## 2018-02-11 NOTE — Progress Notes (Signed)
[name] [name] is a 84 y.o. male who presents to [name]: Primary Care Sports Medicine today for hospital follow-up and diabetes follow-up. [name] [name] was seen last week.  As part of his lab assessment for weakness and fatigue he had a metabolic panel that showed what sugar in the 500s.  As he is transferred today from difficulties he was advised to go to the emergency room for initial management of diabetes.  His blood sugar was controlled that he was discharged with metformin and sulfonylurea medication.  At home his blood sugars have been typically quite well controlled in the 140s.  His low blood sugar was 104 and his max blood sugar was 250.  He is feeling slightly less fatigue but not much better at all.  He continues to take his routine medications.   [name] [name] also was recently seen by his cardiologist.  He continues to experience hypotension and fatigue.  Many of his antihypertensive medications were discontinued several months ago.  He notes that he has not been taking his spironolactone for some time (indication was removed from medication list today).  He was seen by his cardiologist yesterday who was concerned for orthostasis.  Cardiology recommended doing a 3-minute standing orthostatic evaluation.   Anticoagulation: Cardiology also recommends continuing warfarin.  [name] [name] takes warfarin daily and denies significant bleeding.   ROS as above:  Exam:  BP 108/69   Pulse 77   Wt 196 lb (88.9 kg)   BMI 24.50 kg/m   Orthstatic vital signs: Blood pressure sitting 96/59 heart rate 69 Blood pressure standing after 3 minutes blood pressure 117/69 heart rate 72  Gen: Well NAD HEENT: EOMI,  MMM Lungs: Normal work of breathing. CTABL Heart: regular rate no MRG Abd: NABS, Soft. Nondistended, Nontender Exts: Brisk capillary refill, warm and well perfused.   Lab and Radiology Results Lab Results    Component Value Date   HGBA1C 12.3 (A) 02/05/2018       Assessment and Plan: 84 y.o. male with  Diabetes: Current blood sugars are well controlled.  Will continue metformin and switch to glimepiride as this is once daily dosing.  Careful blood sugar checking over the next few weeks and recheck on September 4 is already scheduled.  Lancets and test strips ordered.  Will tolerate A1c greater than 7 to avoid hypoglycemic episodes.  Additional recheck metabolic panel likely next month to ensure kidney function is stable on metformin.   Orthostasis: Blood pressure did not go significantly low and heart rate did not increase significantly with standing for 3 minutes.  I doubt that there is severe orthostasis however more detail test in the future may be warranted. Plan to continue current cardiology medications.   Spironolactone not continue. [name] [name] has not been taking it for some time. His potassium and fluid status seem to be doing well. Plan for watchful waiting.   Anticoagulation: Continue warfarin after discussion with family and after recommendation from cardiology. Will continue to reassess risk of bleeding due to falls on warfarin.   Orders Placed This Encounter  Procedures  . Hemoglobin A1c    This external order was created through the Results Console.   Meds ordered this encounter  Medications  . AMBULATORY NON FORMULARY MEDICATION    Sig: Lancets pen type sufficient for three times daily checking. Dispense 3 month supply E11.65    Dispense:  1 each    Refill:  12  . metFORMIN (GLUCOPHAGE) 500 MG tablet  Sig: Take 1 tablet (500 mg total) by mouth 2 (two) times daily with a meal.    Dispense:  180 tablet    Refill:  1  . glimepiride (AMARYL) 2 MG tablet    Sig: Take 1 tablet (2 mg total) by mouth daily before breakfast.    Dispense:  30 tablet    Refill:  3  . AMBULATORY NON FORMULARY MEDICATION    Sig: Test strips for One Touch Verio Flex meter.  Test 3x daily.   Disp 3 month supply.  E11.65    Dispense:  1 each    Refill:  12  . AMBULATORY NON FORMULARY MEDICATION    Sig: Continuous positive airway pressure (CPAP) machine auto-titrate from 4-20 cm of H2O pressure, with all supplemental supplies as needed. Aerocare if able.    Dispense:  1 each    Refill:  0     Historical information moved to improve visibility of documentation.  Past Medical History:  Diagnosis Date  . Anemia, unspecified 04/07/2013  . Atrial fibrillation (HCC)    fibrillation/flutter  . BPH (benign prostatic hypertrophy)   . Chronic systolic CHF (congestive heart failure) (HCC)    Nonischemic DCM EF 10% s/p BiV AICD  . Coronary artery disease 2008   nonobstructive ASCAD  . Depression   . Hyperlipidemia   . Hypertension   . Hypothyroidism   . Left bundle branch block   . Nonischemic cardiomyopathy (HCC)    ef 10 %  . OSA (obstructive sleep apnea)    intolerant to CPAP  . Osteoarthritis   . Sleep apnea    stopped using CPAP  . Thrombocytopenia (HCC)    Past Surgical History:  Procedure Laterality Date  . CARDIAC DEFIBRILLATOR PLACEMENT     left chest  . CARDIOVERSION N/A 12/29/2012   Procedure: CARDIOVERSION;  Surgeon: [name] Reichert, MD;  Location: [facility];  Service: Cardiovascular;  Laterality: N/A;  . CHOLECYSTECTOMY    . Colonscopy     reportedly negative per pt; around 2005.    . EP IMPLANTABLE DEVICE N/A 05/09/2016   Procedure: BIVI ICD Generator Changeout;  Surgeon: [name] Salvia, MD;  Location: [facility] INVASIVE CV LAB;  Service: Cardiovascular;  Laterality: N/A;  . TRANSURETHRAL RESECTION OF PROSTATE     for BPH   Social History   Tobacco Use  . Smoking status: Never Smoker  . Smokeless tobacco: Never Used  Substance Use Topics  . Alcohol use: No   family history includes Cancer in his maternal uncle; Heart disease in his father; Heart failure in his mother; Stroke in his mother.  Medications: Current Outpatient Medications  Medication  Sig Dispense Refill  . acetaminophen (TYLENOL) 650 MG CR tablet Take 1 tablet (650 mg total) by mouth every 8 (eight) hours as needed for pain. 90 tablet 3  . AMBULATORY NON FORMULARY MEDICATION Continuous positive airway pressure (CPAP) machine auto-titrate from 4-20 cm of H2O pressure, with all supplemental supplies as needed. Aerocare if able. 1 each 0  . carbidopa-levodopa (SINEMET) 25-100 MG tablet Take 1 tablet by mouth 3 (three) times daily.    . diclofenac sodium (VOLTAREN) 1 % GEL Apply 4 g topically 4 (four) times daily. To affected joint. 1440 g 3  . digoxin (DIGOX) 0.125 MG tablet Take 1 tablet (125 mcg total) by mouth every other day. 45 tablet 2  . levothyroxine (SYNTHROID, LEVOTHROID) 137 MCG tablet TAKE 1 TABLET BY MOUTH  DAILY BEFORE BREAKFAST 90 tablet 1  .  metFORMIN (GLUCOPHAGE) 500 MG tablet Take 1 tablet (500 mg total) by mouth 2 (two) times daily with a meal. 180 tablet 1  . mupirocin ointment (BACTROBAN) 2 % Apply to affected area BID for 7 days. 30 g 3  . nystatin cream (MYCOSTATIN) Apply 1 application topically 2 (two) times daily. 60 g 2  . Nystatin POWD Apply liberally to affected area 2 times per day 1 Bottle 11  . NYSTATIN powder APPLY LIBERALLY TO AFFECTED AREA(S) TWICE DAILY  11  . potassium chloride SA (K-DUR,KLOR-CON) 20 MEQ tablet Take 1 tablet (20 mEq total) by mouth daily. 90 tablet 3  . simvastatin (ZOCOR) 10 MG tablet TAKE 1 TABLET BY MOUTH  EVERY EVENING 90 tablet 3  . torsemide (DEMADEX) 20 MG tablet Take 1 tablet (20 mg total) by mouth daily. 30 tablet 0  . warfarin (COUMADIN) 1 MG tablet Take 1mg  pill in addition to 5mg  pill on Monday, Wed, Friday 90 tablet 1  . warfarin (COUMADIN) 5 MG tablet Take 1 tablet (5 mg total) by mouth daily at 6 PM. 30 tablet 0  . AMBULATORY NON FORMULARY MEDICATION Lancets pen type sufficient for three times daily checking. Dispense 3 month supply E11.65 1 each 12  . AMBULATORY NON FORMULARY MEDICATION Test strips for One  Touch Verio Flex meter.  Test 3x daily.  Disp 3 month supply.  E11.65 1 each 12  . glimepiride (AMARYL) 2 MG tablet Take 1 tablet (2 mg total) by mouth daily before breakfast. 30 tablet 3   No current facility-administered medications for this visit.    Allergies  Allergen Reactions  . Amiodarone Other (See Comments)    Neuro toxicity     Discussed warning signs or symptoms. Please see discharge instructions. Patient expresses understanding.

## 2018-02-11 NOTE — Patient Instructions (Addendum)
Thank you for coming in today. Continue metformin.  Switch to Glimepiride Recheck with me as scheduled on the 4th.

## 2018-02-12 DIAGNOSIS — N183 Chronic kidney disease, stage 3 (moderate): Secondary | ICD-10-CM | POA: Diagnosis not present

## 2018-02-12 DIAGNOSIS — I714 Abdominal aortic aneurysm, without rupture: Secondary | ICD-10-CM | POA: Diagnosis not present

## 2018-02-12 DIAGNOSIS — I447 Left bundle-branch block, unspecified: Secondary | ICD-10-CM | POA: Diagnosis not present

## 2018-02-12 DIAGNOSIS — M4807 Spinal stenosis, lumbosacral region: Secondary | ICD-10-CM | POA: Diagnosis not present

## 2018-02-12 DIAGNOSIS — I420 Dilated cardiomyopathy: Secondary | ICD-10-CM | POA: Diagnosis not present

## 2018-02-12 DIAGNOSIS — I5023 Acute on chronic systolic (congestive) heart failure: Secondary | ICD-10-CM | POA: Diagnosis not present

## 2018-02-12 DIAGNOSIS — D631 Anemia in chronic kidney disease: Secondary | ICD-10-CM | POA: Diagnosis not present

## 2018-02-12 DIAGNOSIS — M1612 Unilateral primary osteoarthritis, left hip: Secondary | ICD-10-CM | POA: Diagnosis not present

## 2018-02-12 DIAGNOSIS — I4891 Unspecified atrial fibrillation: Secondary | ICD-10-CM | POA: Diagnosis not present

## 2018-02-12 DIAGNOSIS — I25810 Atherosclerosis of coronary artery bypass graft(s) without angina pectoris: Secondary | ICD-10-CM | POA: Diagnosis not present

## 2018-02-12 DIAGNOSIS — I130 Hypertensive heart and chronic kidney disease with heart failure and stage 1 through stage 4 chronic kidney disease, or unspecified chronic kidney disease: Secondary | ICD-10-CM | POA: Diagnosis not present

## 2018-02-12 DIAGNOSIS — M170 Bilateral primary osteoarthritis of knee: Secondary | ICD-10-CM | POA: Diagnosis not present

## 2018-02-13 ENCOUNTER — Telehealth: Payer: Self-pay | Admitting: Family Medicine

## 2018-02-13 DIAGNOSIS — R3981 Functional urinary incontinence: Secondary | ICD-10-CM

## 2018-02-13 DIAGNOSIS — M6281 Muscle weakness (generalized): Secondary | ICD-10-CM

## 2018-02-13 DIAGNOSIS — R259 Unspecified abnormal involuntary movements: Secondary | ICD-10-CM

## 2018-02-13 MED ORDER — AMBULATORY NON FORMULARY MEDICATION: 0 refills | Status: DC

## 2018-02-13 NOTE — Telephone Encounter (Signed)
Order Printed

## 2018-02-13 NOTE — Telephone Encounter (Signed)
Wife calls and needs an order sent to [facility] for CNA to come into home and help give baths.  Also needs order for social work to come into home to help with services. [facility] number is [phone]. KG LPN  In hospital last week for diabetes sugars over 500.  Discharged last Thursday the 15th.

## 2018-02-13 NOTE — Telephone Encounter (Signed)
Order faxed to [facility] at [phone]. KG LPN

## 2018-02-14 ENCOUNTER — Telehealth: Payer: Self-pay | Admitting: Family Medicine

## 2018-02-14 DIAGNOSIS — I130 Hypertensive heart and chronic kidney disease with heart failure and stage 1 through stage 4 chronic kidney disease, or unspecified chronic kidney disease: Secondary | ICD-10-CM | POA: Diagnosis not present

## 2018-02-14 DIAGNOSIS — I4891 Unspecified atrial fibrillation: Secondary | ICD-10-CM | POA: Diagnosis not present

## 2018-02-14 DIAGNOSIS — I5023 Acute on chronic systolic (congestive) heart failure: Secondary | ICD-10-CM | POA: Diagnosis not present

## 2018-02-14 DIAGNOSIS — I420 Dilated cardiomyopathy: Secondary | ICD-10-CM | POA: Diagnosis not present

## 2018-02-14 DIAGNOSIS — M170 Bilateral primary osteoarthritis of knee: Secondary | ICD-10-CM | POA: Diagnosis not present

## 2018-02-14 DIAGNOSIS — M1612 Unilateral primary osteoarthritis, left hip: Secondary | ICD-10-CM | POA: Diagnosis not present

## 2018-02-14 DIAGNOSIS — D631 Anemia in chronic kidney disease: Secondary | ICD-10-CM | POA: Diagnosis not present

## 2018-02-14 DIAGNOSIS — I714 Abdominal aortic aneurysm, without rupture: Secondary | ICD-10-CM | POA: Diagnosis not present

## 2018-02-14 DIAGNOSIS — M4807 Spinal stenosis, lumbosacral region: Secondary | ICD-10-CM | POA: Diagnosis not present

## 2018-02-14 DIAGNOSIS — N183 Chronic kidney disease, stage 3 (moderate): Secondary | ICD-10-CM | POA: Diagnosis not present

## 2018-02-14 DIAGNOSIS — I447 Left bundle-branch block, unspecified: Secondary | ICD-10-CM | POA: Diagnosis not present

## 2018-02-14 DIAGNOSIS — I25810 Atherosclerosis of coronary artery bypass graft(s) without angina pectoris: Secondary | ICD-10-CM | POA: Diagnosis not present

## 2018-02-14 NOTE — Telephone Encounter (Signed)
[name] [name] called me last night.  [name] [name] had a blood sugar of 82.  He was asymptomatic.  Advised to reduce dose of Amaryl to 1 mg daily (half pill).  Additionally received fax requesting diabetes supplies to [facility].  Fax will be sent today.

## 2018-02-15 DIAGNOSIS — I130 Hypertensive heart and chronic kidney disease with heart failure and stage 1 through stage 4 chronic kidney disease, or unspecified chronic kidney disease: Secondary | ICD-10-CM | POA: Diagnosis not present

## 2018-02-15 DIAGNOSIS — I714 Abdominal aortic aneurysm, without rupture: Secondary | ICD-10-CM | POA: Diagnosis not present

## 2018-02-15 DIAGNOSIS — I447 Left bundle-branch block, unspecified: Secondary | ICD-10-CM | POA: Diagnosis not present

## 2018-02-15 DIAGNOSIS — D631 Anemia in chronic kidney disease: Secondary | ICD-10-CM | POA: Diagnosis not present

## 2018-02-15 DIAGNOSIS — M170 Bilateral primary osteoarthritis of knee: Secondary | ICD-10-CM | POA: Diagnosis not present

## 2018-02-15 DIAGNOSIS — I25810 Atherosclerosis of coronary artery bypass graft(s) without angina pectoris: Secondary | ICD-10-CM | POA: Diagnosis not present

## 2018-02-15 DIAGNOSIS — I4891 Unspecified atrial fibrillation: Secondary | ICD-10-CM | POA: Diagnosis not present

## 2018-02-15 DIAGNOSIS — N183 Chronic kidney disease, stage 3 (moderate): Secondary | ICD-10-CM | POA: Diagnosis not present

## 2018-02-15 DIAGNOSIS — I5023 Acute on chronic systolic (congestive) heart failure: Secondary | ICD-10-CM | POA: Diagnosis not present

## 2018-02-15 DIAGNOSIS — M4807 Spinal stenosis, lumbosacral region: Secondary | ICD-10-CM | POA: Diagnosis not present

## 2018-02-15 DIAGNOSIS — M1612 Unilateral primary osteoarthritis, left hip: Secondary | ICD-10-CM | POA: Diagnosis not present

## 2018-02-15 DIAGNOSIS — I420 Dilated cardiomyopathy: Secondary | ICD-10-CM | POA: Diagnosis not present

## 2018-02-17 DIAGNOSIS — D631 Anemia in chronic kidney disease: Secondary | ICD-10-CM | POA: Diagnosis not present

## 2018-02-17 DIAGNOSIS — I447 Left bundle-branch block, unspecified: Secondary | ICD-10-CM | POA: Diagnosis not present

## 2018-02-17 DIAGNOSIS — I4891 Unspecified atrial fibrillation: Secondary | ICD-10-CM | POA: Diagnosis not present

## 2018-02-17 DIAGNOSIS — M170 Bilateral primary osteoarthritis of knee: Secondary | ICD-10-CM | POA: Diagnosis not present

## 2018-02-17 DIAGNOSIS — N183 Chronic kidney disease, stage 3 (moderate): Secondary | ICD-10-CM | POA: Diagnosis not present

## 2018-02-17 DIAGNOSIS — M4807 Spinal stenosis, lumbosacral region: Secondary | ICD-10-CM | POA: Diagnosis not present

## 2018-02-17 DIAGNOSIS — I420 Dilated cardiomyopathy: Secondary | ICD-10-CM | POA: Diagnosis not present

## 2018-02-17 DIAGNOSIS — I5023 Acute on chronic systolic (congestive) heart failure: Secondary | ICD-10-CM | POA: Diagnosis not present

## 2018-02-17 DIAGNOSIS — M1612 Unilateral primary osteoarthritis, left hip: Secondary | ICD-10-CM | POA: Diagnosis not present

## 2018-02-17 DIAGNOSIS — I130 Hypertensive heart and chronic kidney disease with heart failure and stage 1 through stage 4 chronic kidney disease, or unspecified chronic kidney disease: Secondary | ICD-10-CM | POA: Diagnosis not present

## 2018-02-17 DIAGNOSIS — I25810 Atherosclerosis of coronary artery bypass graft(s) without angina pectoris: Secondary | ICD-10-CM | POA: Diagnosis not present

## 2018-02-17 DIAGNOSIS — I714 Abdominal aortic aneurysm, without rupture: Secondary | ICD-10-CM | POA: Diagnosis not present

## 2018-02-18 DIAGNOSIS — I447 Left bundle-branch block, unspecified: Secondary | ICD-10-CM | POA: Diagnosis not present

## 2018-02-18 DIAGNOSIS — M1612 Unilateral primary osteoarthritis, left hip: Secondary | ICD-10-CM | POA: Diagnosis not present

## 2018-02-18 DIAGNOSIS — I5023 Acute on chronic systolic (congestive) heart failure: Secondary | ICD-10-CM | POA: Diagnosis not present

## 2018-02-18 DIAGNOSIS — I25810 Atherosclerosis of coronary artery bypass graft(s) without angina pectoris: Secondary | ICD-10-CM | POA: Diagnosis not present

## 2018-02-18 DIAGNOSIS — M4807 Spinal stenosis, lumbosacral region: Secondary | ICD-10-CM | POA: Diagnosis not present

## 2018-02-18 DIAGNOSIS — M170 Bilateral primary osteoarthritis of knee: Secondary | ICD-10-CM | POA: Diagnosis not present

## 2018-02-18 DIAGNOSIS — I714 Abdominal aortic aneurysm, without rupture: Secondary | ICD-10-CM | POA: Diagnosis not present

## 2018-02-18 DIAGNOSIS — I130 Hypertensive heart and chronic kidney disease with heart failure and stage 1 through stage 4 chronic kidney disease, or unspecified chronic kidney disease: Secondary | ICD-10-CM | POA: Diagnosis not present

## 2018-02-18 DIAGNOSIS — I420 Dilated cardiomyopathy: Secondary | ICD-10-CM | POA: Diagnosis not present

## 2018-02-18 DIAGNOSIS — D631 Anemia in chronic kidney disease: Secondary | ICD-10-CM | POA: Diagnosis not present

## 2018-02-18 DIAGNOSIS — N183 Chronic kidney disease, stage 3 (moderate): Secondary | ICD-10-CM | POA: Diagnosis not present

## 2018-02-18 DIAGNOSIS — I4891 Unspecified atrial fibrillation: Secondary | ICD-10-CM | POA: Diagnosis not present

## 2018-02-19 ENCOUNTER — Ambulatory Visit (INDEPENDENT_AMBULATORY_CARE_PROVIDER_SITE_OTHER): Payer: Medicare Other | Admitting: *Deleted

## 2018-02-19 DIAGNOSIS — I714 Abdominal aortic aneurysm, without rupture: Secondary | ICD-10-CM | POA: Diagnosis not present

## 2018-02-19 DIAGNOSIS — D631 Anemia in chronic kidney disease: Secondary | ICD-10-CM | POA: Diagnosis not present

## 2018-02-19 DIAGNOSIS — I25810 Atherosclerosis of coronary artery bypass graft(s) without angina pectoris: Secondary | ICD-10-CM | POA: Diagnosis not present

## 2018-02-19 DIAGNOSIS — I5023 Acute on chronic systolic (congestive) heart failure: Secondary | ICD-10-CM | POA: Diagnosis not present

## 2018-02-19 DIAGNOSIS — I428 Other cardiomyopathies: Secondary | ICD-10-CM

## 2018-02-19 DIAGNOSIS — I420 Dilated cardiomyopathy: Secondary | ICD-10-CM | POA: Diagnosis not present

## 2018-02-19 DIAGNOSIS — I4891 Unspecified atrial fibrillation: Secondary | ICD-10-CM | POA: Diagnosis not present

## 2018-02-19 DIAGNOSIS — I447 Left bundle-branch block, unspecified: Secondary | ICD-10-CM | POA: Diagnosis not present

## 2018-02-19 DIAGNOSIS — M1612 Unilateral primary osteoarthritis, left hip: Secondary | ICD-10-CM | POA: Diagnosis not present

## 2018-02-19 DIAGNOSIS — M170 Bilateral primary osteoarthritis of knee: Secondary | ICD-10-CM | POA: Diagnosis not present

## 2018-02-19 DIAGNOSIS — I130 Hypertensive heart and chronic kidney disease with heart failure and stage 1 through stage 4 chronic kidney disease, or unspecified chronic kidney disease: Secondary | ICD-10-CM | POA: Diagnosis not present

## 2018-02-19 DIAGNOSIS — N183 Chronic kidney disease, stage 3 (moderate): Secondary | ICD-10-CM | POA: Diagnosis not present

## 2018-02-19 DIAGNOSIS — M4807 Spinal stenosis, lumbosacral region: Secondary | ICD-10-CM | POA: Diagnosis not present

## 2018-02-19 NOTE — Progress Notes (Signed)
Remote ICD transmission.   

## 2018-02-20 DIAGNOSIS — M4807 Spinal stenosis, lumbosacral region: Secondary | ICD-10-CM | POA: Diagnosis not present

## 2018-02-20 DIAGNOSIS — I25810 Atherosclerosis of coronary artery bypass graft(s) without angina pectoris: Secondary | ICD-10-CM | POA: Diagnosis not present

## 2018-02-20 DIAGNOSIS — M1612 Unilateral primary osteoarthritis, left hip: Secondary | ICD-10-CM | POA: Diagnosis not present

## 2018-02-20 DIAGNOSIS — I4891 Unspecified atrial fibrillation: Secondary | ICD-10-CM | POA: Diagnosis not present

## 2018-02-20 DIAGNOSIS — I482 Chronic atrial fibrillation: Secondary | ICD-10-CM | POA: Diagnosis not present

## 2018-02-20 DIAGNOSIS — I420 Dilated cardiomyopathy: Secondary | ICD-10-CM | POA: Diagnosis not present

## 2018-02-20 DIAGNOSIS — I130 Hypertensive heart and chronic kidney disease with heart failure and stage 1 through stage 4 chronic kidney disease, or unspecified chronic kidney disease: Secondary | ICD-10-CM | POA: Diagnosis not present

## 2018-02-20 DIAGNOSIS — I714 Abdominal aortic aneurysm, without rupture: Secondary | ICD-10-CM | POA: Diagnosis not present

## 2018-02-20 DIAGNOSIS — D631 Anemia in chronic kidney disease: Secondary | ICD-10-CM | POA: Diagnosis not present

## 2018-02-20 DIAGNOSIS — I959 Hypotension, unspecified: Secondary | ICD-10-CM | POA: Diagnosis not present

## 2018-02-20 DIAGNOSIS — N183 Chronic kidney disease, stage 3 (moderate): Secondary | ICD-10-CM | POA: Diagnosis not present

## 2018-02-20 DIAGNOSIS — I447 Left bundle-branch block, unspecified: Secondary | ICD-10-CM | POA: Diagnosis not present

## 2018-02-20 DIAGNOSIS — I5023 Acute on chronic systolic (congestive) heart failure: Secondary | ICD-10-CM | POA: Diagnosis not present

## 2018-02-20 DIAGNOSIS — M170 Bilateral primary osteoarthritis of knee: Secondary | ICD-10-CM | POA: Diagnosis not present

## 2018-02-20 DIAGNOSIS — G20 Parkinson's disease: Secondary | ICD-10-CM | POA: Diagnosis not present

## 2018-02-21 ENCOUNTER — Ambulatory Visit (HOSPITAL_COMMUNITY)
Admission: RE | Admit: 2018-02-21 | Discharge: 2018-02-21 | Disposition: A | Discharge status: Home / Self Care | Payer: Medicare Other | Source: Ambulatory Visit | Attending: Cardiology | Admitting: Cardiology

## 2018-02-21 ENCOUNTER — Other Ambulatory Visit: Payer: Self-pay | Admitting: *Deleted

## 2018-02-21 ENCOUNTER — Other Ambulatory Visit: Payer: Self-pay

## 2018-02-21 ENCOUNTER — Encounter (HOSPITAL_COMMUNITY): Payer: Self-pay | Admitting: Cardiology

## 2018-02-21 ENCOUNTER — Other Ambulatory Visit: Payer: Self-pay | Admitting: Family Medicine

## 2018-02-21 ENCOUNTER — Other Ambulatory Visit: Payer: Self-pay | Admitting: Internal Medicine

## 2018-02-21 VITALS — BP 100/62 | HR 62 | Wt 196.0 lb

## 2018-02-21 DIAGNOSIS — I25810 Atherosclerosis of coronary artery bypass graft(s) without angina pectoris: Secondary | ICD-10-CM | POA: Diagnosis not present

## 2018-02-21 DIAGNOSIS — M1612 Unilateral primary osteoarthritis, left hip: Secondary | ICD-10-CM | POA: Diagnosis not present

## 2018-02-21 DIAGNOSIS — I482 Chronic atrial fibrillation, unspecified: Secondary | ICD-10-CM

## 2018-02-21 DIAGNOSIS — Z79899 Other long term (current) drug therapy: Secondary | ICD-10-CM | POA: Insufficient documentation

## 2018-02-21 DIAGNOSIS — M1990 Unspecified osteoarthritis, unspecified site: Secondary | ICD-10-CM | POA: Diagnosis not present

## 2018-02-21 DIAGNOSIS — I2510 Atherosclerotic heart disease of native coronary artery without angina pectoris: Secondary | ICD-10-CM | POA: Diagnosis not present

## 2018-02-21 DIAGNOSIS — I429 Cardiomyopathy, unspecified: Secondary | ICD-10-CM | POA: Insufficient documentation

## 2018-02-21 DIAGNOSIS — G4733 Obstructive sleep apnea (adult) (pediatric): Secondary | ICD-10-CM | POA: Diagnosis not present

## 2018-02-21 DIAGNOSIS — I110 Hypertensive heart disease with heart failure: Secondary | ICD-10-CM | POA: Insufficient documentation

## 2018-02-21 DIAGNOSIS — I481 Persistent atrial fibrillation: Secondary | ICD-10-CM | POA: Diagnosis not present

## 2018-02-21 DIAGNOSIS — E785 Hyperlipidemia, unspecified: Secondary | ICD-10-CM | POA: Diagnosis not present

## 2018-02-21 DIAGNOSIS — I4891 Unspecified atrial fibrillation: Secondary | ICD-10-CM | POA: Diagnosis not present

## 2018-02-21 DIAGNOSIS — N183 Chronic kidney disease, stage 3 (moderate): Secondary | ICD-10-CM | POA: Diagnosis not present

## 2018-02-21 DIAGNOSIS — I447 Left bundle-branch block, unspecified: Secondary | ICD-10-CM | POA: Insufficient documentation

## 2018-02-21 DIAGNOSIS — I951 Orthostatic hypotension: Secondary | ICD-10-CM | POA: Insufficient documentation

## 2018-02-21 DIAGNOSIS — Z7984 Long term (current) use of oral hypoglycemic drugs: Secondary | ICD-10-CM | POA: Diagnosis not present

## 2018-02-21 DIAGNOSIS — G20 Parkinson's disease: Secondary | ICD-10-CM | POA: Diagnosis not present

## 2018-02-21 DIAGNOSIS — M170 Bilateral primary osteoarthritis of knee: Secondary | ICD-10-CM | POA: Diagnosis not present

## 2018-02-21 DIAGNOSIS — I714 Abdominal aortic aneurysm, without rupture: Secondary | ICD-10-CM | POA: Diagnosis not present

## 2018-02-21 DIAGNOSIS — D631 Anemia in chronic kidney disease: Secondary | ICD-10-CM | POA: Diagnosis not present

## 2018-02-21 DIAGNOSIS — Z7901 Long term (current) use of anticoagulants: Secondary | ICD-10-CM | POA: Diagnosis not present

## 2018-02-21 DIAGNOSIS — I130 Hypertensive heart and chronic kidney disease with heart failure and stage 1 through stage 4 chronic kidney disease, or unspecified chronic kidney disease: Secondary | ICD-10-CM | POA: Diagnosis not present

## 2018-02-21 DIAGNOSIS — I420 Dilated cardiomyopathy: Secondary | ICD-10-CM | POA: Diagnosis not present

## 2018-02-21 DIAGNOSIS — I5022 Chronic systolic (congestive) heart failure: Secondary | ICD-10-CM | POA: Diagnosis not present

## 2018-02-21 DIAGNOSIS — I5023 Acute on chronic systolic (congestive) heart failure: Secondary | ICD-10-CM | POA: Diagnosis not present

## 2018-02-21 DIAGNOSIS — M4807 Spinal stenosis, lumbosacral region: Secondary | ICD-10-CM | POA: Diagnosis not present

## 2018-02-21 MED ORDER — MIDODRINE HCL 2.5 MG PO TABS: 2.5000 mg | ORAL_TABLET | Freq: Three times a day (TID) | ORAL | 11 refills | Status: DC

## 2018-02-21 NOTE — Patient Instructions (Signed)
START Midodrine 2.5 mg tablet three times daily (1 tab every 8 hours).  Follow up 6 weeks with Dr. Shirlee Latch and echocardiogram.  ______________________________________________________________ [place] Code: 1700  Take all medication as prescribed the day of your appointment. Bring all medications with you to your appointment.  Do the following things EVERYDAY: 1) Weigh yourself in the morning before breakfast. Write it down and keep it in a log. 2) Take your medicines as prescribed 3) Eat low salt foods-Limit salt (sodium) to 2000 mg per day.  4) Stay as active as you can everyday 5) Limit all fluids for the day to less than 2 liters

## 2018-02-23 NOTE — Progress Notes (Signed)
Patient ID: [name] [name], male   DOB: 18-May-1932, 84 y.o.   MRN: [id number]     Advanced Heart Failure Clinic Note   Primary Cardiologist: Dr Mayford Knife PCP: Dr Denyse Amass HF Cardiology: Shirlee Latch  HPI: [name] [name] is a 83 y.o. male with PMH of A tach s/p DC-CV 12/29/12, CAD, PAF on chronic coumadin and amiodarone, OSA- CPAP, NICM cath 2008, chronic systolic heart failure EF 25-30% (01/2013) , S/P Medtronic CRT-D 2008 and generator changed 2012, LBBB, CRI (baseline 1.7-1.8) and Hypothyroidism.   He is S/P DC-CV 12/29/12 and initially he felt good for about a week. He had a functional decline and was only able to walk a few steps. He presented [facility] ED 01/22/13 with increased dyspnea on exertion and low extremity edema. He was started on a lasix gtt 10 mg hr. Admit weight 233 lbs. Discharge weight: 214 lbs.    He has been off Coreg due to fatigue.  He is now off amiodarone due to imbalance/gait instability.   He was started on Sinemet by neurology for suspected Parkinsons-type syndrome.    In 5/19, he was noted to be orthostatic and bisoprolol, ramipril, and spironolactone were stopped.  He was noted to be in an atrial tachycardia at that time . Device interrogation today shows persistent atrial fibrillation since early 7/19.   Patient presents for followup of CHF.   Weight down 21 lbs since last appointment in 1/19.  Legs are "weak" and he gets lightheaded if he stands too fast.  Has had falls.  He uses a Ferrone.  He was orthostatic when checked in the office today.  His movement is significantly slowed, he though Sinemet helped at first, but it does not seem to be helping now. No chest pain, no dypsnea.  Just has profound fatigue with exertion.  Echo 10/31/2016 LVEF 35-40%, Mod AI, Mod MR, Severe LAE, Moderate RVH, Severe RAE, PA peak pressure 19 mm Hg  Medtronic device interrogation: Persistent atrial fibrillation since 7/19.  Fluid index < threshold with stable thoracic impedance.    Labs 8/21: Na 132 K  4.5 Cr 1.7 (stable)  Labs 03/02/13 NA 135 Potassium 4.6 Creatinine 1.6 Dig 1.6 TSH 3.95        04/02/13: digoxin 0.8, K+ 4.5, creatinine 1.48         09/08/13 K 4.4 Creatine 1.7          6/18: K 4, creatinine 1.32, TSH normal, hgb 12.9         9/18: TSH normal, K 4.4, creatinine 1.37, LFTs normal         11/18: K 4.1, creatinine 1.47, digoxin 0.3          8/19: digoxin < 0.5, K 4.2, creatinine 1.26         Review of systems complete and found to be negative unless listed in HPI.    Past Medical History:  Diagnosis Date  . Anemia, unspecified 04/07/2013  . Atrial fibrillation (HCC)    fibrillation/flutter  . BPH (benign prostatic hypertrophy)   . Chronic systolic CHF (congestive heart failure) (HCC)    Nonischemic DCM EF 10% s/p BiV AICD  . Coronary artery disease 2008   nonobstructive ASCAD  . Depression   . Hyperlipidemia   . Hypertension   . Hypothyroidism   . Left bundle branch block   . Nonischemic cardiomyopathy (HCC)    ef 10 %  . OSA (obstructive sleep apnea)    intolerant to CPAP  . Osteoarthritis   .  Sleep apnea    stopped using CPAP  . Thrombocytopenia (HCC)     Current Outpatient Medications  Medication Sig Dispense Refill  . acetaminophen (TYLENOL) 650 MG CR tablet Take 1 tablet (650 mg total) by mouth every 8 (eight) hours as needed for pain. 90 tablet 3  . AMBULATORY NON FORMULARY MEDICATION Lancets pen type sufficient for three times daily checking. Dispense 3 month supply E11.65 1 each 12  . AMBULATORY NON FORMULARY MEDICATION Test strips for One Touch Verio Flex meter.  Test 3x daily.  Disp 3 month supply.  E11.65 1 each 12  . AMBULATORY NON FORMULARY MEDICATION Continuous positive airway pressure (CPAP) machine auto-titrate from 4-20 cm of H2O pressure, with all supplemental supplies as needed. [employer]ocare if able. 1 each 0  . AMBULATORY NON FORMULARY MEDICATION order to [facility] for CNA to come into home and help give baths.order for social work  to come into home to help with services. [facility] number is [phone] 1 each 0  . carbidopa-levodopa (SINEMET) 25-100 MG tablet Take 1 tablet by mouth 3 (three) times daily.    . diclofenac sodium (VOLTAREN) 1 % GEL Apply 4 g topically 4 (four) times daily. To affected joint. 1440 g 3  . glimepiride (AMARYL) 2 MG tablet Take 1 tablet (2 mg total) by mouth daily before breakfast. 30 tablet 3  . levothyroxine (SYNTHROID, LEVOTHROID) 137 MCG tablet TAKE 1 TABLET BY MOUTH  DAILY BEFORE BREAKFAST 90 tablet 1  . metFORMIN (GLUCOPHAGE) 500 MG tablet Take 1 tablet (500 mg total) by mouth 2 (two) times daily with a meal. 180 tablet 1  . mupirocin ointment (BACTROBAN) 2 % Apply to affected area BID for 7 days. 30 g 3  . nystatin cream (MYCOSTATIN) Apply 1 application topically 2 (two) times daily. 60 g 2  . Nystatin POWD Apply liberally to affected area 2 times per day 1 Bottle 11  . potassium chloride SA (K-DUR,KLOR-CON) 20 MEQ tablet Take 1 tablet (20 mEq total) by mouth daily. 90 tablet 3  . simvastatin (ZOCOR) 10 MG tablet TAKE 1 TABLET BY MOUTH  EVERY EVENING 90 tablet 3  . torsemide (DEMADEX) 20 MG tablet Take 1 tablet (20 mg total) by mouth daily. 30 tablet 0  . warfarin (COUMADIN) 1 MG tablet Take 1mg  pill in addition to 5mg  pill on Monday, Wed, Friday 90 tablet 1  . warfarin (COUMADIN) 5 MG tablet Take 1 tablet (5 mg total) by mouth daily at 6 PM. 30 tablet 0  . digoxin (LANOXIN) 0.125 MG tablet TAKE 1 TABLET BY MOUTH  EVERY OTHER DAY 45 tablet 3  . midodrine (PROAMATINE) 2.5 MG tablet Take 1 tablet (2.5 mg total) by mouth 3 (three) times daily with meals. 90 tablet 11   No current facility-administered medications for this encounter.     Vitals:   02/21/18 1034 02/21/18 1110 02/21/18 1111 02/21/18 1112  BP: (!) 104/52 124/62 113/61 100/62  Pulse: 71 74 80 62  SpO2: 100% 100% 97% 96%  Weight: 88.9 kg (196 lb)      Wt Readings from Last 3 Encounters:  02/21/18 88.9 kg (196 lb)  02/11/18  88.9 kg (196 lb)  02/10/18 87.5 kg (193 lb)    PHYSICAL EXAM: General: frail, NAD Neck: No JVD, no thyromegaly or thyroid nodule.  Lungs: Clear to auscultation bilaterally with normal respiratory effort. CV: Nondisplaced PMI.  Heart regular S1/S2, no S3/S4, no murmur.  No peripheral edema.  No carotid bruit.  Normal  pedal pulses.  Abdomen: Soft, nontender, no hepatosplenomegaly, no distention.  Skin: Intact without lesions or rashes.  Neurologic: Alert and oriented x 3. Slow movement.  Psych: Normal affect. Extremities: No clubbing or cyanosis.  HEENT: Normal.   ASSESSMENT & PLAN:  1. Chronic systolic HF: NICM 01/2013 EF 20%; Medtronic CRT-D.  Most recent echo 10/2016 LVEF 35-40%, severe LAE, severe RAE, PA peak pressure 19 mm Hg.  NYHA class III symptoms with prominent fatigue, possibly more related to deconditioning and autonomic neuropathy than CHF.  He is not volume overloaded on exam. - Continue torsemide 20 mg daily, recent BMET stable.  - Stay off spironolactone, ramipril, and bisoprolol => He remains orthostatic today and has had falls.  - Continue digoxin, recent level ok.  - Will get repeat echo at next appt.  2) OSA: Continue nightly CPAP 3) Atrial fibrillation: This is persistent, has been present since early 7/19 continuously.  He cannot take amiodarone due to prior toxicity.  He has seen Dr. Graciela Husbands who thought rate control + anticoagulation was the best policy.  - Continue warfarin.   4) Orthostatic hypotension: Suspect autonomic neuropathy.  May be part of the parkinsons picture.  He is orthostatic today.  - Wear compression stockings.  - I will start midodrine 2.5 mg tid, this can be titrated up as needed.  - Cardiac amyloidosis is also a consideration with CHF and autonomic neuropathy.  Cannot get MRI with CRT-D device.  I will plan on getting myeloma panel and PYP scan.  5) Parkinsons - type syndrome: Sinemet was initially helping, but not so much now.  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    Followup in 6 wks with echo.   Marca Ancona, MD  02/23/2018

## 2018-02-25 DIAGNOSIS — I25810 Atherosclerosis of coronary artery bypass graft(s) without angina pectoris: Secondary | ICD-10-CM | POA: Diagnosis not present

## 2018-02-25 DIAGNOSIS — I130 Hypertensive heart and chronic kidney disease with heart failure and stage 1 through stage 4 chronic kidney disease, or unspecified chronic kidney disease: Secondary | ICD-10-CM | POA: Diagnosis not present

## 2018-02-25 DIAGNOSIS — M1612 Unilateral primary osteoarthritis, left hip: Secondary | ICD-10-CM | POA: Diagnosis not present

## 2018-02-25 DIAGNOSIS — I4891 Unspecified atrial fibrillation: Secondary | ICD-10-CM | POA: Diagnosis not present

## 2018-02-25 DIAGNOSIS — I447 Left bundle-branch block, unspecified: Secondary | ICD-10-CM | POA: Diagnosis not present

## 2018-02-25 DIAGNOSIS — D631 Anemia in chronic kidney disease: Secondary | ICD-10-CM | POA: Diagnosis not present

## 2018-02-25 DIAGNOSIS — M4807 Spinal stenosis, lumbosacral region: Secondary | ICD-10-CM | POA: Diagnosis not present

## 2018-02-25 DIAGNOSIS — I420 Dilated cardiomyopathy: Secondary | ICD-10-CM | POA: Diagnosis not present

## 2018-02-25 DIAGNOSIS — I5023 Acute on chronic systolic (congestive) heart failure: Secondary | ICD-10-CM | POA: Diagnosis not present

## 2018-02-25 DIAGNOSIS — I714 Abdominal aortic aneurysm, without rupture: Secondary | ICD-10-CM | POA: Diagnosis not present

## 2018-02-25 DIAGNOSIS — M170 Bilateral primary osteoarthritis of knee: Secondary | ICD-10-CM | POA: Diagnosis not present

## 2018-02-25 DIAGNOSIS — N183 Chronic kidney disease, stage 3 (moderate): Secondary | ICD-10-CM | POA: Diagnosis not present

## 2018-02-26 ENCOUNTER — Ambulatory Visit (INDEPENDENT_AMBULATORY_CARE_PROVIDER_SITE_OTHER): Payer: Medicare Other | Admitting: Family Medicine

## 2018-02-26 ENCOUNTER — Encounter: Payer: Self-pay | Admitting: Family Medicine

## 2018-02-26 VITALS — BP 122/79 | HR 81 | Temp 98.2°F | Wt 193.0 lb

## 2018-02-26 DIAGNOSIS — I428 Other cardiomyopathies: Secondary | ICD-10-CM

## 2018-02-26 DIAGNOSIS — Z7901 Long term (current) use of anticoagulants: Secondary | ICD-10-CM

## 2018-02-26 DIAGNOSIS — R259 Unspecified abnormal involuntary movements: Secondary | ICD-10-CM

## 2018-02-26 DIAGNOSIS — Z95810 Presence of automatic (implantable) cardiac defibrillator: Secondary | ICD-10-CM

## 2018-02-26 DIAGNOSIS — N183 Chronic kidney disease, stage 3 unspecified: Secondary | ICD-10-CM

## 2018-02-26 DIAGNOSIS — E1165 Type 2 diabetes mellitus with hyperglycemia: Secondary | ICD-10-CM | POA: Diagnosis not present

## 2018-02-26 DIAGNOSIS — I714 Abdominal aortic aneurysm, without rupture, unspecified: Secondary | ICD-10-CM

## 2018-02-26 DIAGNOSIS — L97512 Non-pressure chronic ulcer of other part of right foot with fat layer exposed: Secondary | ICD-10-CM | POA: Diagnosis not present

## 2018-02-26 DIAGNOSIS — E11621 Type 2 diabetes mellitus with foot ulcer: Secondary | ICD-10-CM | POA: Diagnosis not present

## 2018-02-26 DIAGNOSIS — I5023 Acute on chronic systolic (congestive) heart failure: Secondary | ICD-10-CM

## 2018-02-26 DIAGNOSIS — I481 Persistent atrial fibrillation: Secondary | ICD-10-CM | POA: Diagnosis not present

## 2018-02-26 DIAGNOSIS — Z66 Do not resuscitate: Secondary | ICD-10-CM

## 2018-02-26 DIAGNOSIS — Z23 Encounter for immunization: Secondary | ICD-10-CM | POA: Diagnosis not present

## 2018-02-26 DIAGNOSIS — E1122 Type 2 diabetes mellitus with diabetic chronic kidney disease: Secondary | ICD-10-CM

## 2018-02-26 DIAGNOSIS — I4819 Other persistent atrial fibrillation: Secondary | ICD-10-CM

## 2018-02-26 DIAGNOSIS — I25708 Atherosclerosis of coronary artery bypass graft(s), unspecified, with other forms of angina pectoris: Secondary | ICD-10-CM

## 2018-02-26 DIAGNOSIS — B351 Tinea unguium: Secondary | ICD-10-CM | POA: Diagnosis not present

## 2018-02-26 DIAGNOSIS — G4733 Obstructive sleep apnea (adult) (pediatric): Secondary | ICD-10-CM

## 2018-02-26 LAB — POCT INR: INR: 2.1 (ref 2.0–3.0)

## 2018-02-26 MED ORDER — GLIMEPIRIDE 1 MG PO TABS: 1.0000 mg | ORAL_TABLET | Freq: Every day | ORAL | 1 refills | Status: DC

## 2018-02-26 NOTE — Patient Instructions (Signed)
Thank you for coming in today. Recheck in 1 month.  Return sooner if needed.    

## 2018-02-26 NOTE — Progress Notes (Signed)
[name] [name] is a 84 y.o. male who presents to [name] today for follow up diabetes, heart failure, parkinsonism, and fatigue.   [name] [name] was found to have uncontrolled diabetes with hyperglycemia into the 500s.  He was hospitalized and is here for his follow-up after his initial hospitalization.  He is currently taking metformin 500 mg twice daily.  Additionally he was started on Amaryl 2 mg daily on the 20th.  His blood sugar had an episode down into the 80s and his was decreased to 1 mg daily.  His blood sugar since then have been typically pretty well controlled with a low of around 100 and max of about 150.  He denies severe polyuria or polydipsia.  He notes that he continues to experience significant fatigue and difficulty with walking and weightbearing.  This is thought to be due to a combination of factors including autonomic dysfunction, heart failure, Parkinson's disease.  He is been receiving home health physical therapy but not improving significantly.  He is been seen by both of his cardiologist relatively recently for his defibrillator device as well as his heart failure.  He was started recently on midodrine.  He is not sure if this made any difference yet.  He has a follow-up appointment with cardiology next month for repeat echocardiogram and for recheck.  He takes torsemide regularly for fluid and diuresis and notes that he does not feel fluid overloaded.  He denies severe leg swelling today.  He notes that he is not sure of any benefit from the Sinemet from his Parkinson symptoms.  He had initial benefit but is plateaued.  He continues to take his prescribed below.  Additionally he like to discuss goals of care.  He would like to avoid intensive invasive treatments.  Most form filled out today.  DNR status no feeding tubes.  No intubation.  Additionally he  would like to discuss sleep apnea.  He is not using CPAP machine.  He notes that he does not think he can afford to have a CPAP machine and also he finds them very obnoxious and does not want to use it.  ROS as above:  Exam:  BP 122/79   Pulse 81   Temp 98.2 F (36.8 C) (Oral)   Wt 193 lb (87.5 kg)   BMI 24.12 kg/m  Wt Readings from Last 5 Encounters:  02/26/18 193 lb (87.5 kg)  02/21/18 196 lb (88.9 kg)  02/11/18 196 lb (88.9 kg)  02/10/18 193 lb (87.5 kg)  02/03/18 193 lb (87.5 kg)    Gen: Well NAD HEENT: EOMI,  MMM Lungs: Normal work of breathing. CTABL Heart: MRG Abd: NABS, Soft. Nondistended, Nontender Exts: Brisk capillary refill, warm and well perfused.  No edema.  Lab and Radiology Results Results for orders placed or performed in visit on 02/26/18 (from the past 72 hour(s))  POCT INR     Status: None   Collection Time: 02/26/18 10:54 AM  Result Value Ref Range   INR 2.1 2.0 - 3.0   No results found.    Assessment and Plan: 84 y.o. male with  Diabetes: Well-controlled.  Continue current regimen.  Recheck in 1 month.  Will avoid urine microalbumin screening as patient is not a candidate for the treatment including ACE inhibitors or angiotensin receptor blockers due to his hypotension and poor functional capacity.  Sleep apnea: Patient declines CPAP therapy.  Watchful waiting.  Heart failure: Certainly  a factor here.  Anticipate echo results.  Continue current regimen.  Parkinson's features: Stable plateaued.  Watchful waiting.  Goals of care: Discussed.  Most form filled out.  DNR status. See scanned documents.   Recheck in 1 month.   I spent 40 minutes with this patient, greater than 50% was face-to-face time counseling regarding ddx and plan.  Flu vaccine given today  Orders Placed This Encounter  Procedures  . Flu vaccine HIGH DOSE PF (Fluzone High dose)  . POCT INR   Meds ordered this encounter  Medications  . glimepiride (AMARYL) 1 MG tablet      Sig: Take 1 tablet (1 mg total) by mouth daily before breakfast.    Dispense:  90 tablet    Refill:  1     Historical information moved to improve visibility of documentation.  Past Medical History:  Diagnosis Date  . Anemia, unspecified 04/07/2013  . Atrial fibrillation (HCC)    fibrillation/flutter  . BPH (benign prostatic hypertrophy)   . Chronic systolic CHF (congestive heart failure) (HCC)    Nonischemic DCM EF 10% s/p BiV AICD  . Coronary artery disease 2008   nonobstructive ASCAD  . Depression   . Hyperlipidemia   . Hypertension   . Hypothyroidism   . Left bundle branch block   . Nonischemic cardiomyopathy (HCC)    ef 10 %  . OSA (obstructive sleep apnea)    intolerant to CPAP  . Osteoarthritis   . Sleep apnea    stopped using CPAP  . Thrombocytopenia (HCC)    Past Surgical History:  Procedure Laterality Date  . CARDIAC DEFIBRILLATOR PLACEMENT     left chest  . CARDIOVERSION N/A 12/29/2012   Procedure: CARDIOVERSION;  Surgeon: Quintella Reichert, MD;  Location: [facility];  Service: Cardiovascular;  Laterality: N/A;  . CHOLECYSTECTOMY    . Colonscopy     reportedly negative per pt; around 2005.    . EP IMPLANTABLE DEVICE N/A 05/09/2016   Procedure: BIVI ICD Generator Changeout;  Surgeon: Duke Salvia, MD;  Location: [facility] INVASIVE CV LAB;  Service: Cardiovascular;  Laterality: N/A;  . TRANSURETHRAL RESECTION OF PROSTATE     for BPH   Social History   Tobacco Use  . Smoking status: Never Smoker  . Smokeless tobacco: Never Used  Substance Use Topics  . Alcohol use: No   family history includes Cancer in his maternal uncle; Heart disease in his father; Heart failure in his mother; Stroke in his mother.  Medications: Current Outpatient Medications  Medication Sig Dispense Refill  . acetaminophen (TYLENOL) 650 MG CR tablet Take 1 tablet (650 mg total) by mouth every 8 (eight) hours as needed for pain. 90 tablet 3  . AMBULATORY NON FORMULARY MEDICATION  Lancets pen type sufficient for three times daily checking. Dispense 3 month supply E11.65 1 each 12  . AMBULATORY NON FORMULARY MEDICATION Test strips for One Touch Verio Flex meter.  Test 3x daily.  Disp 3 month supply.  E11.65 1 each 12  . AMBULATORY NON FORMULARY MEDICATION order to [facility] for CNA to come into home and help give baths.order for social work to come into home to help with services. [facility] number is [phone] 1 each 0  . carbidopa-levodopa (SINEMET) 25-100 MG tablet Take 1 tablet by mouth 3 (three) times daily.    . diclofenac sodium (VOLTAREN) 1 % GEL Apply 4 g topically 4 (four) times daily. To affected joint. 1440 g 3  . digoxin (LANOXIN)  0.125 MG tablet TAKE 1 TABLET BY MOUTH  EVERY OTHER DAY 45 tablet 3  . glimepiride (AMARYL) 1 MG tablet Take 1 tablet (1 mg total) by mouth daily before breakfast. 90 tablet 1  . levothyroxine (SYNTHROID, LEVOTHROID) 137 MCG tablet TAKE 1 TABLET BY MOUTH  DAILY BEFORE BREAKFAST 90 tablet 1  . metFORMIN (GLUCOPHAGE) 500 MG tablet Take 1 tablet (500 mg total) by mouth 2 (two) times daily with a meal. 180 tablet 1  . midodrine (PROAMATINE) 2.5 MG tablet Take 1 tablet (2.5 mg total) by mouth 3 (three) times daily with meals. 90 tablet 11  . mupirocin ointment (BACTROBAN) 2 % Apply to affected area BID for 7 days. 30 g 3  . nystatin cream (MYCOSTATIN) Apply 1 application topically 2 (two) times daily. 60 g 2  . Nystatin POWD Apply liberally to affected area 2 times per day 1 Bottle 11  . potassium chloride SA (K-DUR,KLOR-CON) 20 MEQ tablet Take 1 tablet (20 mEq total) by mouth daily. 90 tablet 3  . simvastatin (ZOCOR) 10 MG tablet TAKE 1 TABLET BY MOUTH  EVERY EVENING 90 tablet 3  . torsemide (DEMADEX) 20 MG tablet Take 1 tablet (20 mg total) by mouth daily. 30 tablet 0  . warfarin (COUMADIN) 5 MG tablet TAKE 1 TABLET BY MOUTH  EVERY DAY 90 tablet 3   No current facility-administered medications for this visit.    Allergies    Allergen Reactions  . Amiodarone Other (See Comments)    Neuro toxicity     Discussed warning signs or symptoms. Please see discharge instructions. Patient expresses understanding.

## 2018-02-27 DIAGNOSIS — M170 Bilateral primary osteoarthritis of knee: Secondary | ICD-10-CM | POA: Diagnosis not present

## 2018-02-27 DIAGNOSIS — I4891 Unspecified atrial fibrillation: Secondary | ICD-10-CM | POA: Diagnosis not present

## 2018-02-27 DIAGNOSIS — I447 Left bundle-branch block, unspecified: Secondary | ICD-10-CM | POA: Diagnosis not present

## 2018-02-27 DIAGNOSIS — I5023 Acute on chronic systolic (congestive) heart failure: Secondary | ICD-10-CM | POA: Diagnosis not present

## 2018-02-27 DIAGNOSIS — M4807 Spinal stenosis, lumbosacral region: Secondary | ICD-10-CM | POA: Diagnosis not present

## 2018-02-27 DIAGNOSIS — I714 Abdominal aortic aneurysm, without rupture: Secondary | ICD-10-CM | POA: Diagnosis not present

## 2018-02-27 DIAGNOSIS — I25810 Atherosclerosis of coronary artery bypass graft(s) without angina pectoris: Secondary | ICD-10-CM | POA: Diagnosis not present

## 2018-02-27 DIAGNOSIS — M1612 Unilateral primary osteoarthritis, left hip: Secondary | ICD-10-CM | POA: Diagnosis not present

## 2018-02-27 DIAGNOSIS — I420 Dilated cardiomyopathy: Secondary | ICD-10-CM | POA: Diagnosis not present

## 2018-02-27 DIAGNOSIS — N183 Chronic kidney disease, stage 3 (moderate): Secondary | ICD-10-CM | POA: Diagnosis not present

## 2018-02-27 DIAGNOSIS — I130 Hypertensive heart and chronic kidney disease with heart failure and stage 1 through stage 4 chronic kidney disease, or unspecified chronic kidney disease: Secondary | ICD-10-CM | POA: Diagnosis not present

## 2018-02-27 DIAGNOSIS — D631 Anemia in chronic kidney disease: Secondary | ICD-10-CM | POA: Diagnosis not present

## 2018-02-28 ENCOUNTER — Telehealth (HOSPITAL_COMMUNITY): Payer: Self-pay | Admitting: *Deleted

## 2018-02-28 DIAGNOSIS — M170 Bilateral primary osteoarthritis of knee: Secondary | ICD-10-CM | POA: Diagnosis not present

## 2018-02-28 DIAGNOSIS — I25810 Atherosclerosis of coronary artery bypass graft(s) without angina pectoris: Secondary | ICD-10-CM | POA: Diagnosis not present

## 2018-02-28 DIAGNOSIS — I5023 Acute on chronic systolic (congestive) heart failure: Secondary | ICD-10-CM | POA: Diagnosis not present

## 2018-02-28 DIAGNOSIS — I130 Hypertensive heart and chronic kidney disease with heart failure and stage 1 through stage 4 chronic kidney disease, or unspecified chronic kidney disease: Secondary | ICD-10-CM | POA: Diagnosis not present

## 2018-02-28 DIAGNOSIS — M1612 Unilateral primary osteoarthritis, left hip: Secondary | ICD-10-CM | POA: Diagnosis not present

## 2018-02-28 DIAGNOSIS — I714 Abdominal aortic aneurysm, without rupture: Secondary | ICD-10-CM | POA: Diagnosis not present

## 2018-02-28 DIAGNOSIS — D631 Anemia in chronic kidney disease: Secondary | ICD-10-CM | POA: Diagnosis not present

## 2018-02-28 DIAGNOSIS — N183 Chronic kidney disease, stage 3 (moderate): Secondary | ICD-10-CM | POA: Diagnosis not present

## 2018-02-28 DIAGNOSIS — E859 Amyloidosis, unspecified: Secondary | ICD-10-CM

## 2018-02-28 DIAGNOSIS — I420 Dilated cardiomyopathy: Secondary | ICD-10-CM | POA: Diagnosis not present

## 2018-02-28 DIAGNOSIS — I447 Left bundle-branch block, unspecified: Secondary | ICD-10-CM | POA: Diagnosis not present

## 2018-02-28 DIAGNOSIS — I4891 Unspecified atrial fibrillation: Secondary | ICD-10-CM | POA: Diagnosis not present

## 2018-02-28 DIAGNOSIS — M4807 Spinal stenosis, lumbosacral region: Secondary | ICD-10-CM | POA: Diagnosis not present

## 2018-02-28 NOTE — Telephone Encounter (Signed)
Dr.McLean requests patient have a PYP exam. No pre cert reqd. Message routed to [facility] to schedule.

## 2018-03-03 DIAGNOSIS — I714 Abdominal aortic aneurysm, without rupture: Secondary | ICD-10-CM | POA: Diagnosis not present

## 2018-03-03 DIAGNOSIS — I447 Left bundle-branch block, unspecified: Secondary | ICD-10-CM | POA: Diagnosis not present

## 2018-03-03 DIAGNOSIS — I4891 Unspecified atrial fibrillation: Secondary | ICD-10-CM | POA: Diagnosis not present

## 2018-03-03 DIAGNOSIS — I130 Hypertensive heart and chronic kidney disease with heart failure and stage 1 through stage 4 chronic kidney disease, or unspecified chronic kidney disease: Secondary | ICD-10-CM | POA: Diagnosis not present

## 2018-03-03 DIAGNOSIS — I25810 Atherosclerosis of coronary artery bypass graft(s) without angina pectoris: Secondary | ICD-10-CM | POA: Diagnosis not present

## 2018-03-03 DIAGNOSIS — I420 Dilated cardiomyopathy: Secondary | ICD-10-CM | POA: Diagnosis not present

## 2018-03-03 DIAGNOSIS — M4807 Spinal stenosis, lumbosacral region: Secondary | ICD-10-CM | POA: Diagnosis not present

## 2018-03-03 DIAGNOSIS — M170 Bilateral primary osteoarthritis of knee: Secondary | ICD-10-CM | POA: Diagnosis not present

## 2018-03-03 DIAGNOSIS — I5023 Acute on chronic systolic (congestive) heart failure: Secondary | ICD-10-CM | POA: Diagnosis not present

## 2018-03-03 DIAGNOSIS — M1612 Unilateral primary osteoarthritis, left hip: Secondary | ICD-10-CM | POA: Diagnosis not present

## 2018-03-03 DIAGNOSIS — N183 Chronic kidney disease, stage 3 (moderate): Secondary | ICD-10-CM | POA: Diagnosis not present

## 2018-03-03 DIAGNOSIS — D631 Anemia in chronic kidney disease: Secondary | ICD-10-CM | POA: Diagnosis not present

## 2018-03-04 DIAGNOSIS — I420 Dilated cardiomyopathy: Secondary | ICD-10-CM | POA: Diagnosis not present

## 2018-03-04 DIAGNOSIS — M1612 Unilateral primary osteoarthritis, left hip: Secondary | ICD-10-CM | POA: Diagnosis not present

## 2018-03-04 DIAGNOSIS — N183 Chronic kidney disease, stage 3 (moderate): Secondary | ICD-10-CM | POA: Diagnosis not present

## 2018-03-04 DIAGNOSIS — I4891 Unspecified atrial fibrillation: Secondary | ICD-10-CM | POA: Diagnosis not present

## 2018-03-04 DIAGNOSIS — M4807 Spinal stenosis, lumbosacral region: Secondary | ICD-10-CM | POA: Diagnosis not present

## 2018-03-04 DIAGNOSIS — I25810 Atherosclerosis of coronary artery bypass graft(s) without angina pectoris: Secondary | ICD-10-CM | POA: Diagnosis not present

## 2018-03-04 DIAGNOSIS — I447 Left bundle-branch block, unspecified: Secondary | ICD-10-CM | POA: Diagnosis not present

## 2018-03-04 DIAGNOSIS — I714 Abdominal aortic aneurysm, without rupture: Secondary | ICD-10-CM | POA: Diagnosis not present

## 2018-03-04 DIAGNOSIS — I130 Hypertensive heart and chronic kidney disease with heart failure and stage 1 through stage 4 chronic kidney disease, or unspecified chronic kidney disease: Secondary | ICD-10-CM | POA: Diagnosis not present

## 2018-03-04 DIAGNOSIS — I5023 Acute on chronic systolic (congestive) heart failure: Secondary | ICD-10-CM | POA: Diagnosis not present

## 2018-03-04 DIAGNOSIS — M170 Bilateral primary osteoarthritis of knee: Secondary | ICD-10-CM | POA: Diagnosis not present

## 2018-03-04 DIAGNOSIS — D631 Anemia in chronic kidney disease: Secondary | ICD-10-CM | POA: Diagnosis not present

## 2018-03-05 DIAGNOSIS — I714 Abdominal aortic aneurysm, without rupture: Secondary | ICD-10-CM | POA: Diagnosis not present

## 2018-03-05 DIAGNOSIS — D631 Anemia in chronic kidney disease: Secondary | ICD-10-CM | POA: Diagnosis not present

## 2018-03-05 DIAGNOSIS — I5023 Acute on chronic systolic (congestive) heart failure: Secondary | ICD-10-CM | POA: Diagnosis not present

## 2018-03-05 DIAGNOSIS — N183 Chronic kidney disease, stage 3 (moderate): Secondary | ICD-10-CM | POA: Diagnosis not present

## 2018-03-05 DIAGNOSIS — I420 Dilated cardiomyopathy: Secondary | ICD-10-CM | POA: Diagnosis not present

## 2018-03-05 DIAGNOSIS — I4891 Unspecified atrial fibrillation: Secondary | ICD-10-CM | POA: Diagnosis not present

## 2018-03-05 DIAGNOSIS — I447 Left bundle-branch block, unspecified: Secondary | ICD-10-CM | POA: Diagnosis not present

## 2018-03-05 DIAGNOSIS — M170 Bilateral primary osteoarthritis of knee: Secondary | ICD-10-CM | POA: Diagnosis not present

## 2018-03-05 DIAGNOSIS — I130 Hypertensive heart and chronic kidney disease with heart failure and stage 1 through stage 4 chronic kidney disease, or unspecified chronic kidney disease: Secondary | ICD-10-CM | POA: Diagnosis not present

## 2018-03-05 DIAGNOSIS — M4807 Spinal stenosis, lumbosacral region: Secondary | ICD-10-CM | POA: Diagnosis not present

## 2018-03-05 DIAGNOSIS — M1612 Unilateral primary osteoarthritis, left hip: Secondary | ICD-10-CM | POA: Diagnosis not present

## 2018-03-05 DIAGNOSIS — I25810 Atherosclerosis of coronary artery bypass graft(s) without angina pectoris: Secondary | ICD-10-CM | POA: Diagnosis not present

## 2018-03-06 DIAGNOSIS — S76111D Strain of right quadriceps muscle, fascia and tendon, subsequent encounter: Secondary | ICD-10-CM | POA: Diagnosis not present

## 2018-03-06 DIAGNOSIS — I5032 Chronic diastolic (congestive) heart failure: Secondary | ICD-10-CM | POA: Diagnosis not present

## 2018-03-06 DIAGNOSIS — M48061 Spinal stenosis, lumbar region without neurogenic claudication: Secondary | ICD-10-CM | POA: Diagnosis not present

## 2018-03-06 DIAGNOSIS — M6281 Muscle weakness (generalized): Secondary | ICD-10-CM | POA: Diagnosis not present

## 2018-03-07 DIAGNOSIS — I420 Dilated cardiomyopathy: Secondary | ICD-10-CM | POA: Diagnosis not present

## 2018-03-07 DIAGNOSIS — M170 Bilateral primary osteoarthritis of knee: Secondary | ICD-10-CM | POA: Diagnosis not present

## 2018-03-07 DIAGNOSIS — M4807 Spinal stenosis, lumbosacral region: Secondary | ICD-10-CM | POA: Diagnosis not present

## 2018-03-07 DIAGNOSIS — D631 Anemia in chronic kidney disease: Secondary | ICD-10-CM | POA: Diagnosis not present

## 2018-03-07 DIAGNOSIS — I4891 Unspecified atrial fibrillation: Secondary | ICD-10-CM | POA: Diagnosis not present

## 2018-03-07 DIAGNOSIS — I714 Abdominal aortic aneurysm, without rupture: Secondary | ICD-10-CM | POA: Diagnosis not present

## 2018-03-07 DIAGNOSIS — I25810 Atherosclerosis of coronary artery bypass graft(s) without angina pectoris: Secondary | ICD-10-CM | POA: Diagnosis not present

## 2018-03-07 DIAGNOSIS — M1612 Unilateral primary osteoarthritis, left hip: Secondary | ICD-10-CM | POA: Diagnosis not present

## 2018-03-07 DIAGNOSIS — I5023 Acute on chronic systolic (congestive) heart failure: Secondary | ICD-10-CM | POA: Diagnosis not present

## 2018-03-07 DIAGNOSIS — I130 Hypertensive heart and chronic kidney disease with heart failure and stage 1 through stage 4 chronic kidney disease, or unspecified chronic kidney disease: Secondary | ICD-10-CM | POA: Diagnosis not present

## 2018-03-07 DIAGNOSIS — N183 Chronic kidney disease, stage 3 (moderate): Secondary | ICD-10-CM | POA: Diagnosis not present

## 2018-03-07 DIAGNOSIS — I447 Left bundle-branch block, unspecified: Secondary | ICD-10-CM | POA: Diagnosis not present

## 2018-03-10 DIAGNOSIS — N183 Chronic kidney disease, stage 3 (moderate): Secondary | ICD-10-CM | POA: Diagnosis not present

## 2018-03-10 DIAGNOSIS — I130 Hypertensive heart and chronic kidney disease with heart failure and stage 1 through stage 4 chronic kidney disease, or unspecified chronic kidney disease: Secondary | ICD-10-CM | POA: Diagnosis not present

## 2018-03-10 DIAGNOSIS — I5023 Acute on chronic systolic (congestive) heart failure: Secondary | ICD-10-CM | POA: Diagnosis not present

## 2018-03-10 DIAGNOSIS — I447 Left bundle-branch block, unspecified: Secondary | ICD-10-CM | POA: Diagnosis not present

## 2018-03-10 DIAGNOSIS — M1612 Unilateral primary osteoarthritis, left hip: Secondary | ICD-10-CM | POA: Diagnosis not present

## 2018-03-10 DIAGNOSIS — I420 Dilated cardiomyopathy: Secondary | ICD-10-CM | POA: Diagnosis not present

## 2018-03-10 DIAGNOSIS — M170 Bilateral primary osteoarthritis of knee: Secondary | ICD-10-CM | POA: Diagnosis not present

## 2018-03-10 DIAGNOSIS — I714 Abdominal aortic aneurysm, without rupture: Secondary | ICD-10-CM | POA: Diagnosis not present

## 2018-03-10 DIAGNOSIS — D631 Anemia in chronic kidney disease: Secondary | ICD-10-CM | POA: Diagnosis not present

## 2018-03-10 DIAGNOSIS — I25810 Atherosclerosis of coronary artery bypass graft(s) without angina pectoris: Secondary | ICD-10-CM | POA: Diagnosis not present

## 2018-03-10 DIAGNOSIS — M4807 Spinal stenosis, lumbosacral region: Secondary | ICD-10-CM | POA: Diagnosis not present

## 2018-03-10 DIAGNOSIS — I4891 Unspecified atrial fibrillation: Secondary | ICD-10-CM | POA: Diagnosis not present

## 2018-03-11 DIAGNOSIS — M4807 Spinal stenosis, lumbosacral region: Secondary | ICD-10-CM | POA: Diagnosis not present

## 2018-03-11 DIAGNOSIS — I420 Dilated cardiomyopathy: Secondary | ICD-10-CM | POA: Diagnosis not present

## 2018-03-11 DIAGNOSIS — I4891 Unspecified atrial fibrillation: Secondary | ICD-10-CM | POA: Diagnosis not present

## 2018-03-11 DIAGNOSIS — I130 Hypertensive heart and chronic kidney disease with heart failure and stage 1 through stage 4 chronic kidney disease, or unspecified chronic kidney disease: Secondary | ICD-10-CM | POA: Diagnosis not present

## 2018-03-11 DIAGNOSIS — M170 Bilateral primary osteoarthritis of knee: Secondary | ICD-10-CM | POA: Diagnosis not present

## 2018-03-11 DIAGNOSIS — I25810 Atherosclerosis of coronary artery bypass graft(s) without angina pectoris: Secondary | ICD-10-CM | POA: Diagnosis not present

## 2018-03-11 DIAGNOSIS — N183 Chronic kidney disease, stage 3 (moderate): Secondary | ICD-10-CM | POA: Diagnosis not present

## 2018-03-11 DIAGNOSIS — D631 Anemia in chronic kidney disease: Secondary | ICD-10-CM | POA: Diagnosis not present

## 2018-03-11 DIAGNOSIS — M1612 Unilateral primary osteoarthritis, left hip: Secondary | ICD-10-CM | POA: Diagnosis not present

## 2018-03-11 DIAGNOSIS — I714 Abdominal aortic aneurysm, without rupture: Secondary | ICD-10-CM | POA: Diagnosis not present

## 2018-03-11 DIAGNOSIS — I447 Left bundle-branch block, unspecified: Secondary | ICD-10-CM | POA: Diagnosis not present

## 2018-03-11 DIAGNOSIS — I5023 Acute on chronic systolic (congestive) heart failure: Secondary | ICD-10-CM | POA: Diagnosis not present

## 2018-03-11 LAB — CUP PACEART REMOTE DEVICE CHECK
Brady Statistic AP VP Percent: 0.81 %
Brady Statistic AP VS Percent: 0.01 %
Brady Statistic AS VP Percent: 83.49 %
Brady Statistic RA Percent Paced: 0.75 %
Brady Statistic RV Percent Paced: 84.62 %
Date Time Interrogation Session: 20190828084224
HighPow Impedance: 39 Ohm
HighPow Impedance: 49 Ohm
Implantable Lead Implant Date: 20080806
Implantable Lead Implant Date: 20080806
Implantable Lead Location: 753860
Implantable Lead Model: 4076
Lead Channel Impedance Value: 323 Ohm
Lead Channel Impedance Value: 380 Ohm
Lead Channel Impedance Value: 950 Ohm
Lead Channel Pacing Threshold Amplitude: 1 V
Lead Channel Pacing Threshold Pulse Width: 0.4 ms
Lead Channel Sensing Intrinsic Amplitude: 0.375 mV
Lead Channel Setting Pacing Amplitude: 2 V
Lead Channel Setting Pacing Amplitude: 2 V
Lead Channel Setting Pacing Pulse Width: 0.4 ms
Lead Channel Setting Sensing Sensitivity: 0.3 mV
MDC IDC LEAD IMPLANT DT: 20080806
MDC IDC LEAD LOCATION: 753858
MDC IDC LEAD LOCATION: 753859
MDC IDC MSMT BATTERY REMAINING LONGEVITY: 70 mo
MDC IDC MSMT BATTERY VOLTAGE: 2.97 V
MDC IDC MSMT LEADCHNL LV IMPEDANCE VALUE: 513 Ohm
MDC IDC MSMT LEADCHNL LV IMPEDANCE VALUE: 570 Ohm
MDC IDC MSMT LEADCHNL RA SENSING INTR AMPL: 0.375 mV
MDC IDC MSMT LEADCHNL RV IMPEDANCE VALUE: 342 Ohm
MDC IDC MSMT LEADCHNL RV SENSING INTR AMPL: 8.5 mV
MDC IDC MSMT LEADCHNL RV SENSING INTR AMPL: 9.375 mV
MDC IDC PG IMPLANT DT: 20171115
MDC IDC SET LEADCHNL RA PACING AMPLITUDE: 2 V
MDC IDC SET LEADCHNL RV PACING PULSEWIDTH: 0.4 ms
MDC IDC STAT BRADY AS VS PERCENT: 15.68 %

## 2018-03-12 DIAGNOSIS — L97512 Non-pressure chronic ulcer of other part of right foot with fat layer exposed: Secondary | ICD-10-CM | POA: Diagnosis not present

## 2018-03-12 DIAGNOSIS — Z7984 Long term (current) use of oral hypoglycemic drugs: Secondary | ICD-10-CM | POA: Diagnosis not present

## 2018-03-12 DIAGNOSIS — E11621 Type 2 diabetes mellitus with foot ulcer: Secondary | ICD-10-CM | POA: Diagnosis not present

## 2018-03-13 ENCOUNTER — Encounter: Payer: Self-pay | Admitting: Family Medicine

## 2018-03-13 DIAGNOSIS — I4891 Unspecified atrial fibrillation: Secondary | ICD-10-CM | POA: Diagnosis not present

## 2018-03-13 DIAGNOSIS — I714 Abdominal aortic aneurysm, without rupture: Secondary | ICD-10-CM | POA: Diagnosis not present

## 2018-03-13 DIAGNOSIS — I130 Hypertensive heart and chronic kidney disease with heart failure and stage 1 through stage 4 chronic kidney disease, or unspecified chronic kidney disease: Secondary | ICD-10-CM | POA: Diagnosis not present

## 2018-03-13 DIAGNOSIS — N183 Chronic kidney disease, stage 3 (moderate): Secondary | ICD-10-CM | POA: Diagnosis not present

## 2018-03-13 DIAGNOSIS — I25810 Atherosclerosis of coronary artery bypass graft(s) without angina pectoris: Secondary | ICD-10-CM | POA: Diagnosis not present

## 2018-03-13 DIAGNOSIS — M170 Bilateral primary osteoarthritis of knee: Secondary | ICD-10-CM | POA: Diagnosis not present

## 2018-03-13 DIAGNOSIS — M1612 Unilateral primary osteoarthritis, left hip: Secondary | ICD-10-CM | POA: Diagnosis not present

## 2018-03-13 DIAGNOSIS — M4807 Spinal stenosis, lumbosacral region: Secondary | ICD-10-CM | POA: Diagnosis not present

## 2018-03-13 DIAGNOSIS — I420 Dilated cardiomyopathy: Secondary | ICD-10-CM | POA: Diagnosis not present

## 2018-03-13 DIAGNOSIS — D631 Anemia in chronic kidney disease: Secondary | ICD-10-CM | POA: Diagnosis not present

## 2018-03-13 DIAGNOSIS — I5023 Acute on chronic systolic (congestive) heart failure: Secondary | ICD-10-CM | POA: Diagnosis not present

## 2018-03-13 DIAGNOSIS — I447 Left bundle-branch block, unspecified: Secondary | ICD-10-CM | POA: Diagnosis not present

## 2018-03-13 NOTE — Progress Notes (Signed)
Discharge summary received from skilled nursing.  Will be sent to scan.

## 2018-03-14 ENCOUNTER — Other Ambulatory Visit (HOSPITAL_COMMUNITY): Payer: Self-pay

## 2018-03-14 DIAGNOSIS — I420 Dilated cardiomyopathy: Secondary | ICD-10-CM | POA: Diagnosis not present

## 2018-03-14 DIAGNOSIS — D631 Anemia in chronic kidney disease: Secondary | ICD-10-CM | POA: Diagnosis not present

## 2018-03-14 DIAGNOSIS — I25810 Atherosclerosis of coronary artery bypass graft(s) without angina pectoris: Secondary | ICD-10-CM | POA: Diagnosis not present

## 2018-03-14 DIAGNOSIS — M4807 Spinal stenosis, lumbosacral region: Secondary | ICD-10-CM | POA: Diagnosis not present

## 2018-03-14 DIAGNOSIS — I447 Left bundle-branch block, unspecified: Secondary | ICD-10-CM | POA: Diagnosis not present

## 2018-03-14 DIAGNOSIS — I5023 Acute on chronic systolic (congestive) heart failure: Secondary | ICD-10-CM | POA: Diagnosis not present

## 2018-03-14 DIAGNOSIS — M1612 Unilateral primary osteoarthritis, left hip: Secondary | ICD-10-CM | POA: Diagnosis not present

## 2018-03-14 DIAGNOSIS — I714 Abdominal aortic aneurysm, without rupture: Secondary | ICD-10-CM | POA: Diagnosis not present

## 2018-03-14 DIAGNOSIS — I4891 Unspecified atrial fibrillation: Secondary | ICD-10-CM | POA: Diagnosis not present

## 2018-03-14 DIAGNOSIS — M170 Bilateral primary osteoarthritis of knee: Secondary | ICD-10-CM | POA: Diagnosis not present

## 2018-03-14 DIAGNOSIS — I130 Hypertensive heart and chronic kidney disease with heart failure and stage 1 through stage 4 chronic kidney disease, or unspecified chronic kidney disease: Secondary | ICD-10-CM | POA: Diagnosis not present

## 2018-03-14 DIAGNOSIS — N183 Chronic kidney disease, stage 3 (moderate): Secondary | ICD-10-CM | POA: Diagnosis not present

## 2018-03-14 MED ORDER — MIDODRINE HCL 2.5 MG PO TABS: 2.5000 mg | ORAL_TABLET | Freq: Three times a day (TID) | ORAL | 11 refills | Status: DC

## 2018-03-17 DIAGNOSIS — D631 Anemia in chronic kidney disease: Secondary | ICD-10-CM | POA: Diagnosis not present

## 2018-03-17 DIAGNOSIS — I447 Left bundle-branch block, unspecified: Secondary | ICD-10-CM | POA: Diagnosis not present

## 2018-03-17 DIAGNOSIS — I5023 Acute on chronic systolic (congestive) heart failure: Secondary | ICD-10-CM | POA: Diagnosis not present

## 2018-03-17 DIAGNOSIS — I130 Hypertensive heart and chronic kidney disease with heart failure and stage 1 through stage 4 chronic kidney disease, or unspecified chronic kidney disease: Secondary | ICD-10-CM | POA: Diagnosis not present

## 2018-03-17 DIAGNOSIS — M1612 Unilateral primary osteoarthritis, left hip: Secondary | ICD-10-CM | POA: Diagnosis not present

## 2018-03-17 DIAGNOSIS — I25810 Atherosclerosis of coronary artery bypass graft(s) without angina pectoris: Secondary | ICD-10-CM | POA: Diagnosis not present

## 2018-03-17 DIAGNOSIS — I714 Abdominal aortic aneurysm, without rupture: Secondary | ICD-10-CM | POA: Diagnosis not present

## 2018-03-17 DIAGNOSIS — N183 Chronic kidney disease, stage 3 (moderate): Secondary | ICD-10-CM | POA: Diagnosis not present

## 2018-03-17 DIAGNOSIS — M170 Bilateral primary osteoarthritis of knee: Secondary | ICD-10-CM | POA: Diagnosis not present

## 2018-03-17 DIAGNOSIS — M4807 Spinal stenosis, lumbosacral region: Secondary | ICD-10-CM | POA: Diagnosis not present

## 2018-03-17 DIAGNOSIS — I4891 Unspecified atrial fibrillation: Secondary | ICD-10-CM | POA: Diagnosis not present

## 2018-03-17 DIAGNOSIS — I420 Dilated cardiomyopathy: Secondary | ICD-10-CM | POA: Diagnosis not present

## 2018-03-18 DIAGNOSIS — M4807 Spinal stenosis, lumbosacral region: Secondary | ICD-10-CM | POA: Diagnosis not present

## 2018-03-18 DIAGNOSIS — D631 Anemia in chronic kidney disease: Secondary | ICD-10-CM | POA: Diagnosis not present

## 2018-03-18 DIAGNOSIS — N183 Chronic kidney disease, stage 3 (moderate): Secondary | ICD-10-CM | POA: Diagnosis not present

## 2018-03-18 DIAGNOSIS — I714 Abdominal aortic aneurysm, without rupture: Secondary | ICD-10-CM | POA: Diagnosis not present

## 2018-03-18 DIAGNOSIS — M1612 Unilateral primary osteoarthritis, left hip: Secondary | ICD-10-CM | POA: Diagnosis not present

## 2018-03-18 DIAGNOSIS — I447 Left bundle-branch block, unspecified: Secondary | ICD-10-CM | POA: Diagnosis not present

## 2018-03-18 DIAGNOSIS — I130 Hypertensive heart and chronic kidney disease with heart failure and stage 1 through stage 4 chronic kidney disease, or unspecified chronic kidney disease: Secondary | ICD-10-CM | POA: Diagnosis not present

## 2018-03-18 DIAGNOSIS — I25810 Atherosclerosis of coronary artery bypass graft(s) without angina pectoris: Secondary | ICD-10-CM | POA: Diagnosis not present

## 2018-03-18 DIAGNOSIS — I5023 Acute on chronic systolic (congestive) heart failure: Secondary | ICD-10-CM | POA: Diagnosis not present

## 2018-03-18 DIAGNOSIS — I4891 Unspecified atrial fibrillation: Secondary | ICD-10-CM | POA: Diagnosis not present

## 2018-03-18 DIAGNOSIS — I420 Dilated cardiomyopathy: Secondary | ICD-10-CM | POA: Diagnosis not present

## 2018-03-18 DIAGNOSIS — M170 Bilateral primary osteoarthritis of knee: Secondary | ICD-10-CM | POA: Diagnosis not present

## 2018-03-19 DIAGNOSIS — L97512 Non-pressure chronic ulcer of other part of right foot with fat layer exposed: Secondary | ICD-10-CM | POA: Insufficient documentation

## 2018-03-19 DIAGNOSIS — I130 Hypertensive heart and chronic kidney disease with heart failure and stage 1 through stage 4 chronic kidney disease, or unspecified chronic kidney disease: Secondary | ICD-10-CM | POA: Diagnosis not present

## 2018-03-19 DIAGNOSIS — M170 Bilateral primary osteoarthritis of knee: Secondary | ICD-10-CM | POA: Diagnosis not present

## 2018-03-19 DIAGNOSIS — I5023 Acute on chronic systolic (congestive) heart failure: Secondary | ICD-10-CM | POA: Diagnosis not present

## 2018-03-19 DIAGNOSIS — I420 Dilated cardiomyopathy: Secondary | ICD-10-CM | POA: Diagnosis not present

## 2018-03-19 DIAGNOSIS — E08621 Diabetes mellitus due to underlying condition with foot ulcer: Secondary | ICD-10-CM | POA: Insufficient documentation

## 2018-03-19 DIAGNOSIS — I25810 Atherosclerosis of coronary artery bypass graft(s) without angina pectoris: Secondary | ICD-10-CM | POA: Diagnosis not present

## 2018-03-19 DIAGNOSIS — D631 Anemia in chronic kidney disease: Secondary | ICD-10-CM | POA: Diagnosis not present

## 2018-03-19 DIAGNOSIS — I4891 Unspecified atrial fibrillation: Secondary | ICD-10-CM | POA: Diagnosis not present

## 2018-03-19 DIAGNOSIS — M4807 Spinal stenosis, lumbosacral region: Secondary | ICD-10-CM | POA: Diagnosis not present

## 2018-03-19 DIAGNOSIS — I714 Abdominal aortic aneurysm, without rupture: Secondary | ICD-10-CM | POA: Diagnosis not present

## 2018-03-19 DIAGNOSIS — N183 Chronic kidney disease, stage 3 (moderate): Secondary | ICD-10-CM | POA: Diagnosis not present

## 2018-03-19 DIAGNOSIS — M1612 Unilateral primary osteoarthritis, left hip: Secondary | ICD-10-CM | POA: Diagnosis not present

## 2018-03-19 DIAGNOSIS — I447 Left bundle-branch block, unspecified: Secondary | ICD-10-CM | POA: Diagnosis not present

## 2018-03-20 ENCOUNTER — Encounter (HOSPITAL_COMMUNITY): Payer: Medicare Other

## 2018-03-21 ENCOUNTER — Telehealth (HOSPITAL_COMMUNITY): Payer: Self-pay

## 2018-03-21 ENCOUNTER — Inpatient Hospital Stay (HOSPITAL_COMMUNITY): Admission: RE | Admit: 2018-03-21 | Payer: Medicare Other | Source: Ambulatory Visit

## 2018-03-21 DIAGNOSIS — H524 Presbyopia: Secondary | ICD-10-CM | POA: Diagnosis not present

## 2018-03-21 NOTE — Telephone Encounter (Signed)
Encounter complete. 

## 2018-03-22 LAB — CUP PACEART INCLINIC DEVICE CHECK
Brady Statistic AP VP Percent: 37.17 %
Brady Statistic AP VS Percent: 0.24 %
Brady Statistic AS VP Percent: 56.99 %
Brady Statistic AS VS Percent: 5.6 %
HIGH POWER IMPEDANCE MEASURED VALUE: 42 Ohm
HIGH POWER IMPEDANCE MEASURED VALUE: 53 Ohm
Implantable Lead Implant Date: 20080806
Implantable Lead Location: 753858
Implantable Lead Location: 753860
Implantable Lead Model: 4076
Implantable Lead Model: 6947
Lead Channel Impedance Value: 1045 Ohm
Lead Channel Impedance Value: 323 Ohm
Lead Channel Impedance Value: 380 Ohm
Lead Channel Impedance Value: 380 Ohm
Lead Channel Impedance Value: 551 Ohm
Lead Channel Pacing Threshold Amplitude: 0.75 V
Lead Channel Pacing Threshold Pulse Width: 0.4 ms
Lead Channel Sensing Intrinsic Amplitude: 9.375 mV
Lead Channel Setting Pacing Amplitude: 2 V
Lead Channel Setting Pacing Pulse Width: 0.4 ms
Lead Channel Setting Sensing Sensitivity: 0.3 mV
MDC IDC LEAD IMPLANT DT: 20080806
MDC IDC LEAD IMPLANT DT: 20080806
MDC IDC LEAD LOCATION: 753859
MDC IDC MSMT BATTERY REMAINING LONGEVITY: 73 mo
MDC IDC MSMT BATTERY VOLTAGE: 2.97 V
MDC IDC MSMT LEADCHNL LV IMPEDANCE VALUE: 646 Ohm
MDC IDC MSMT LEADCHNL RA SENSING INTR AMPL: 0.5 mV
MDC IDC MSMT LEADCHNL RV PACING THRESHOLD AMPLITUDE: 1 V
MDC IDC MSMT LEADCHNL RV PACING THRESHOLD PULSEWIDTH: 0.4 ms
MDC IDC PG IMPLANT DT: 20171115
MDC IDC SESS DTM: 20190819160805
MDC IDC SET LEADCHNL LV PACING PULSEWIDTH: 0.4 ms
MDC IDC SET LEADCHNL RA PACING AMPLITUDE: 2 V
MDC IDC SET LEADCHNL RV PACING AMPLITUDE: 2 V
MDC IDC STAT BRADY RA PERCENT PACED: 31.43 %
MDC IDC STAT BRADY RV PERCENT PACED: 92.02 %

## 2018-03-25 ENCOUNTER — Ambulatory Visit: Payer: Medicare Other | Admitting: Family Medicine

## 2018-03-26 ENCOUNTER — Encounter: Payer: Self-pay | Admitting: Family Medicine

## 2018-03-26 ENCOUNTER — Ambulatory Visit (INDEPENDENT_AMBULATORY_CARE_PROVIDER_SITE_OTHER): Payer: Medicare Other | Admitting: Family Medicine

## 2018-03-26 ENCOUNTER — Telehealth: Payer: Self-pay | Admitting: Family Medicine

## 2018-03-26 VITALS — BP 126/79 | HR 78 | Wt 195.0 lb

## 2018-03-26 DIAGNOSIS — I5023 Acute on chronic systolic (congestive) heart failure: Secondary | ICD-10-CM | POA: Diagnosis not present

## 2018-03-26 DIAGNOSIS — L97519 Non-pressure chronic ulcer of other part of right foot with unspecified severity: Secondary | ICD-10-CM

## 2018-03-26 DIAGNOSIS — Z139 Encounter for screening, unspecified: Secondary | ICD-10-CM | POA: Diagnosis not present

## 2018-03-26 DIAGNOSIS — I428 Other cardiomyopathies: Secondary | ICD-10-CM | POA: Diagnosis not present

## 2018-03-26 DIAGNOSIS — I25708 Atherosclerosis of coronary artery bypass graft(s), unspecified, with other forms of angina pectoris: Secondary | ICD-10-CM

## 2018-03-26 DIAGNOSIS — E08621 Diabetes mellitus due to underlying condition with foot ulcer: Secondary | ICD-10-CM

## 2018-03-26 DIAGNOSIS — R0609 Other forms of dyspnea: Secondary | ICD-10-CM

## 2018-03-26 DIAGNOSIS — E1122 Type 2 diabetes mellitus with diabetic chronic kidney disease: Secondary | ICD-10-CM

## 2018-03-26 DIAGNOSIS — M6281 Muscle weakness (generalized): Secondary | ICD-10-CM

## 2018-03-26 DIAGNOSIS — R259 Unspecified abnormal involuntary movements: Secondary | ICD-10-CM

## 2018-03-26 DIAGNOSIS — I4819 Other persistent atrial fibrillation: Secondary | ICD-10-CM

## 2018-03-26 DIAGNOSIS — N183 Chronic kidney disease, stage 3 unspecified: Secondary | ICD-10-CM

## 2018-03-26 DIAGNOSIS — L97512 Non-pressure chronic ulcer of other part of right foot with fat layer exposed: Secondary | ICD-10-CM

## 2018-03-26 LAB — POCT INR: INR: 1.4 — AB (ref 2.0–3.0)

## 2018-03-26 MED ORDER — WARFARIN SODIUM 1 MG PO TABS: ORAL_TABLET | ORAL | 2 refills | Status: DC

## 2018-03-26 NOTE — Patient Instructions (Signed)
Thank you for coming in today. Recheck in 1 month.  You should hear about the shoes.   Return sooner if needed.   I will order home health wound care.   I recommend against driving.

## 2018-03-26 NOTE — Progress Notes (Signed)
[name] [name] is a 84 y.o. male who presents to [name] today for follow-up foot ulcer, diabetes, heart failure, Parkinson's.  [name] [name] has a history of a callus with ulceration of the right plantar lateral foot.  He has been seen by podiatry who was shaved the callus down a bit and recommended custom diabetic shoes which she is in the process of ordering.  He thinks is improving.  Diabetes: Reasonably well controlled with medication as below.  His home blood sugars typically are anywhere from high 80s to low 130s.  No hypoglycemic episode.  No polyuria or polydipsia.  Heart failure: [name] [name] has a variety of significant cardiac problems including atrial fibrillation cardiomyopathy coronary artery disease and heart failure.  He has had problems with exertional capacity recently and has received physical therapy which did not help very much.  He takes medications below.  He has a stress test scheduled for October 3 and an echocardiogram and follow-up with cardiology later this month.  He notes significant difficulty with exertional fatigue.  No severe chest pain.  Parkinson's: [name] [name] also continues to struggle with his Parkinson's symptoms.  He continues to have difficulty with ambulation and gait.  He received extensive home health physical therapy and had minimal benefit.  He continues to take Sinemet 3 times daily.  Driving: [name] [name] attempted to drive again and his family is very concerned about his safety with driving.  He has not had any accidents recently but family is worried about his ability to safely drive a car.   ROS as above:  Exam:  BP 126/79   Pulse 78   Wt 195 lb (88.5 kg)   BMI 24.37 kg/m  Wt Readings from Last 5 Encounters:  03/26/18 195 lb (88.5 kg)  02/26/18 193 lb (87.5 kg)  02/21/18 196 lb (88.9 kg)  02/11/18 196 lb (88.9 kg)  02/10/18 193 lb  (87.5 kg)    Gen: Well NAD HEENT: EOMI,  MMM Lungs: Normal work of breathing. CTABL Heart: RRR no MRG Abd: NABS, Soft. Nondistended, Nontender Exts: Brisk capillary refill, warm and well perfused.  Right foot: Plantar callus at fifth MTP with underlying mild skin breakdown nontender.  Sensation is decreased right foot.  Pulses are intact. Left foot: No callus formation.  Claw toe formation present decreased sensation.  Pulses intact.  Lab Results  Component Value Date   INR 1.4 (A) 03/26/2018   INR 2.1 02/26/2018   INR 1.8 (H) 02/03/2018      Assessment and Plan: 84 y.o. male with  Diabetes: Reasonably well controlled.  Continue current regimen and recheck in about a month or so.  Foot ulcer: Improving.  Plan for diabetic shoe at home health nursing for wound care.  Continue to follow-up with podiatry.  Heart failure: Stable not worsening not much improved.  Follow-up with cardiology and proceed with stress testing and echocardiogram as scheduled.  Parkinson's: Stable not worse not better.  Failed trial of physical therapy.  Continue current regimen.  Patient may need more care including potentially nursing home or assisted living facility in the future.  Driving: [name] [name] is not safe to drive and I strongly advised him to stop driving.  His family was present for this discussion.  INR: Low today.  Add back 1 mg 2 days a week dose of warfarin to 5 mg daily he is currently taking.  Recheck in 1 month.  I spent 40 minutes with this  patient, greater than 50% was face-to-face time counseling regarding ddx plan and driving safety.    Orders Placed This Encounter  Procedures  . Ambulatory referral to Home Health    Referral Priority:   Routine    Referral Type:   Home Health Care    Referral Reason:   Specialty Services Required    Requested Specialty:   Home Health Services    Number of Visits Requested:   1  . POCT INR   Meds ordered this encounter  Medications  .  warfarin (COUMADIN) 1 MG tablet    Sig: Take 1 mg pill on Tuesday and Friday in addition to 5mg  dose    Dispense:  30 tablet    Refill:  2     Historical information moved to improve visibility of documentation.  Past Medical History:  Diagnosis Date  . Anemia, unspecified 04/07/2013  . Atrial fibrillation (HCC)    fibrillation/flutter  . BPH (benign prostatic hypertrophy)   . Chronic systolic CHF (congestive heart failure) (HCC)    Nonischemic DCM EF 10% s/p BiV AICD  . Coronary artery disease 2008   nonobstructive ASCAD  . Depression   . Hyperlipidemia   . Hypertension   . Hypothyroidism   . Left bundle branch block   . Nonischemic cardiomyopathy (HCC)    ef 10 %  . OSA (obstructive sleep apnea)    intolerant to CPAP  . Osteoarthritis   . Sleep apnea    stopped using CPAP  . Thrombocytopenia (HCC)    Past Surgical History:  Procedure Laterality Date  . CARDIAC DEFIBRILLATOR PLACEMENT     left chest  . CARDIOVERSION N/A 12/29/2012   Procedure: CARDIOVERSION;  Surgeon: Quintella Reichert, MD;  Location: [facility];  Service: Cardiovascular;  Laterality: N/A;  . CHOLECYSTECTOMY    . Colonscopy     reportedly negative per pt; around 2005.    . EP IMPLANTABLE DEVICE N/A 05/09/2016   Procedure: BIVI ICD Generator Changeout;  Surgeon: Duke Salvia, MD;  Location: [facility] INVASIVE CV LAB;  Service: Cardiovascular;  Laterality: N/A;  . TRANSURETHRAL RESECTION OF PROSTATE     for BPH   Social History   Tobacco Use  . Smoking status: Never Smoker  . Smokeless tobacco: Never Used  Substance Use Topics  . Alcohol use: No   family history includes Cancer in his maternal uncle; Heart disease in his father; Heart failure in his mother; Stroke in his mother.  Medications: Current Outpatient Medications  Medication Sig Dispense Refill  . acetaminophen (TYLENOL) 650 MG CR tablet Take 1 tablet (650 mg total) by mouth every 8 (eight) hours as needed for pain. 90 tablet 3  .  AMBULATORY NON FORMULARY MEDICATION Lancets pen type sufficient for three times daily checking. Dispense 3 month supply E11.65 1 each 12  . AMBULATORY NON FORMULARY MEDICATION Test strips for One Touch Verio Flex meter.  Test 3x daily.  Disp 3 month supply.  E11.65 1 each 12  . AMBULATORY NON FORMULARY MEDICATION order to [facility] for CNA to come into home and help give baths.order for social work to come into home to help with services. [facility] number is [phone] 1 each 0  . carbidopa-levodopa (SINEMET) 25-100 MG tablet Take 1 tablet by mouth 3 (three) times daily.    . diclofenac sodium (VOLTAREN) 1 % GEL Apply 4 g topically 4 (four) times daily. To affected joint. 1440 g 3  . digoxin (LANOXIN) 0.125 MG tablet TAKE  1 TABLET BY MOUTH  EVERY OTHER DAY 45 tablet 3  . glimepiride (AMARYL) 1 MG tablet Take 1 tablet (1 mg total) by mouth daily before breakfast. 90 tablet 1  . levothyroxine (SYNTHROID, LEVOTHROID) 137 MCG tablet TAKE 1 TABLET BY MOUTH  DAILY BEFORE BREAKFAST 90 tablet 1  . metFORMIN (GLUCOPHAGE) 500 MG tablet Take 1 tablet (500 mg total) by mouth 2 (two) times daily with a meal. 180 tablet 1  . midodrine (PROAMATINE) 2.5 MG tablet Take 1 tablet (2.5 mg total) by mouth 3 (three) times daily with meals. 90 tablet 11  . mupirocin ointment (BACTROBAN) 2 % Apply to affected area BID for 7 days. 30 g 3  . nystatin cream (MYCOSTATIN) Apply 1 application topically 2 (two) times daily. 60 g 2  . Nystatin POWD Apply liberally to affected area 2 times per day 1 Bottle 11  . potassium chloride SA (K-DUR,KLOR-CON) 20 MEQ tablet Take 1 tablet (20 mEq total) by mouth daily. 90 tablet 3  . simvastatin (ZOCOR) 10 MG tablet TAKE 1 TABLET BY MOUTH  EVERY EVENING 90 tablet 3  . torsemide (DEMADEX) 20 MG tablet Take 1 tablet (20 mg total) by mouth daily. 30 tablet 0  . warfarin (COUMADIN) 5 MG tablet TAKE 1 TABLET BY MOUTH  EVERY DAY 90 tablet 3  . warfarin (COUMADIN) 1 MG tablet Take 1  mg pill on Tuesday and Friday in addition to 5mg  dose 30 tablet 2   No current facility-administered medications for this visit.    Allergies  Allergen Reactions  . Amiodarone Other (See Comments)    Neuro toxicity     Discussed warning signs or symptoms. Please see discharge instructions. Patient expresses understanding.

## 2018-03-26 NOTE — Telephone Encounter (Signed)
Pt's wife called to advise the Pt has been taking the additional coumadin tablet. She states she gives his medications everyday and has been giving him 6mg  on Mon/Wed/Fri and 5mg  all other days. She would like to know what changes are to be made.

## 2018-03-27 ENCOUNTER — Ambulatory Visit (HOSPITAL_COMMUNITY)
Admission: RE | Admit: 2018-03-27 | Discharge: 2018-03-27 | Disposition: A | Discharge status: Home / Self Care | Payer: Medicare Other | Source: Ambulatory Visit | Attending: Cardiology | Admitting: Cardiology

## 2018-03-27 DIAGNOSIS — E859 Amyloidosis, unspecified: Secondary | ICD-10-CM

## 2018-03-27 MED ORDER — WARFARIN SODIUM 1 MG PO TABS: ORAL_TABLET | ORAL | 2 refills | Status: DC

## 2018-03-27 MED ORDER — TECHNETIUM TC 99M PYROPHOSPHATE
19.4000 | Freq: Once | INTRAVENOUS | Status: AC
  Administered 2018-03-27: 19.4 via INTRAVENOUS

## 2018-03-27 NOTE — Telephone Encounter (Signed)
Increase warfarin to Monday Wednesday Thursday and Friday (4 days a week) and recheck INR at cardiology office if able otherwise on recheck with me.

## 2018-03-27 NOTE — Telephone Encounter (Signed)
Pt's wife advised. Able to provide correct read back. He has appt with Cardiology next week, will have it rechecked then. No further questions.

## 2018-03-29 DIAGNOSIS — E1122 Type 2 diabetes mellitus with diabetic chronic kidney disease: Secondary | ICD-10-CM | POA: Diagnosis not present

## 2018-03-29 DIAGNOSIS — L97511 Non-pressure chronic ulcer of other part of right foot limited to breakdown of skin: Secondary | ICD-10-CM | POA: Diagnosis not present

## 2018-03-29 DIAGNOSIS — D631 Anemia in chronic kidney disease: Secondary | ICD-10-CM | POA: Diagnosis not present

## 2018-03-29 DIAGNOSIS — Z7984 Long term (current) use of oral hypoglycemic drugs: Secondary | ICD-10-CM | POA: Diagnosis not present

## 2018-03-29 DIAGNOSIS — I130 Hypertensive heart and chronic kidney disease with heart failure and stage 1 through stage 4 chronic kidney disease, or unspecified chronic kidney disease: Secondary | ICD-10-CM | POA: Diagnosis not present

## 2018-03-29 DIAGNOSIS — Z95810 Presence of automatic (implantable) cardiac defibrillator: Secondary | ICD-10-CM | POA: Diagnosis not present

## 2018-03-29 DIAGNOSIS — Z9181 History of falling: Secondary | ICD-10-CM | POA: Diagnosis not present

## 2018-03-29 DIAGNOSIS — I4819 Other persistent atrial fibrillation: Secondary | ICD-10-CM | POA: Diagnosis not present

## 2018-03-29 DIAGNOSIS — I2510 Atherosclerotic heart disease of native coronary artery without angina pectoris: Secondary | ICD-10-CM | POA: Diagnosis not present

## 2018-03-29 DIAGNOSIS — M170 Bilateral primary osteoarthritis of knee: Secondary | ICD-10-CM | POA: Diagnosis not present

## 2018-03-29 DIAGNOSIS — I5023 Acute on chronic systolic (congestive) heart failure: Secondary | ICD-10-CM | POA: Diagnosis not present

## 2018-03-29 DIAGNOSIS — M1612 Unilateral primary osteoarthritis, left hip: Secondary | ICD-10-CM | POA: Diagnosis not present

## 2018-03-29 DIAGNOSIS — G20 Parkinson's disease: Secondary | ICD-10-CM | POA: Diagnosis not present

## 2018-03-29 DIAGNOSIS — I429 Cardiomyopathy, unspecified: Secondary | ICD-10-CM | POA: Diagnosis not present

## 2018-03-29 DIAGNOSIS — Z7901 Long term (current) use of anticoagulants: Secondary | ICD-10-CM | POA: Diagnosis not present

## 2018-03-29 DIAGNOSIS — E11621 Type 2 diabetes mellitus with foot ulcer: Secondary | ICD-10-CM | POA: Diagnosis not present

## 2018-03-29 DIAGNOSIS — E039 Hypothyroidism, unspecified: Secondary | ICD-10-CM | POA: Diagnosis not present

## 2018-03-29 DIAGNOSIS — M48061 Spinal stenosis, lumbar region without neurogenic claudication: Secondary | ICD-10-CM | POA: Diagnosis not present

## 2018-03-29 DIAGNOSIS — N183 Chronic kidney disease, stage 3 (moderate): Secondary | ICD-10-CM | POA: Diagnosis not present

## 2018-03-29 DIAGNOSIS — G4733 Obstructive sleep apnea (adult) (pediatric): Secondary | ICD-10-CM | POA: Diagnosis not present

## 2018-04-01 DIAGNOSIS — D631 Anemia in chronic kidney disease: Secondary | ICD-10-CM | POA: Diagnosis not present

## 2018-04-01 DIAGNOSIS — E11621 Type 2 diabetes mellitus with foot ulcer: Secondary | ICD-10-CM | POA: Diagnosis not present

## 2018-04-01 DIAGNOSIS — E1122 Type 2 diabetes mellitus with diabetic chronic kidney disease: Secondary | ICD-10-CM | POA: Diagnosis not present

## 2018-04-01 DIAGNOSIS — L97511 Non-pressure chronic ulcer of other part of right foot limited to breakdown of skin: Secondary | ICD-10-CM | POA: Diagnosis not present

## 2018-04-01 DIAGNOSIS — I130 Hypertensive heart and chronic kidney disease with heart failure and stage 1 through stage 4 chronic kidney disease, or unspecified chronic kidney disease: Secondary | ICD-10-CM | POA: Diagnosis not present

## 2018-04-01 DIAGNOSIS — Z7984 Long term (current) use of oral hypoglycemic drugs: Secondary | ICD-10-CM | POA: Diagnosis not present

## 2018-04-01 DIAGNOSIS — M170 Bilateral primary osteoarthritis of knee: Secondary | ICD-10-CM | POA: Diagnosis not present

## 2018-04-01 DIAGNOSIS — E039 Hypothyroidism, unspecified: Secondary | ICD-10-CM | POA: Diagnosis not present

## 2018-04-01 DIAGNOSIS — I429 Cardiomyopathy, unspecified: Secondary | ICD-10-CM | POA: Diagnosis not present

## 2018-04-01 DIAGNOSIS — M48061 Spinal stenosis, lumbar region without neurogenic claudication: Secondary | ICD-10-CM | POA: Diagnosis not present

## 2018-04-01 DIAGNOSIS — Z9181 History of falling: Secondary | ICD-10-CM | POA: Diagnosis not present

## 2018-04-01 DIAGNOSIS — M1612 Unilateral primary osteoarthritis, left hip: Secondary | ICD-10-CM | POA: Diagnosis not present

## 2018-04-01 DIAGNOSIS — I4819 Other persistent atrial fibrillation: Secondary | ICD-10-CM | POA: Diagnosis not present

## 2018-04-01 DIAGNOSIS — Z95810 Presence of automatic (implantable) cardiac defibrillator: Secondary | ICD-10-CM | POA: Diagnosis not present

## 2018-04-01 DIAGNOSIS — I2510 Atherosclerotic heart disease of native coronary artery without angina pectoris: Secondary | ICD-10-CM | POA: Diagnosis not present

## 2018-04-01 DIAGNOSIS — G4733 Obstructive sleep apnea (adult) (pediatric): Secondary | ICD-10-CM | POA: Diagnosis not present

## 2018-04-01 DIAGNOSIS — I5023 Acute on chronic systolic (congestive) heart failure: Secondary | ICD-10-CM | POA: Diagnosis not present

## 2018-04-01 DIAGNOSIS — Z7901 Long term (current) use of anticoagulants: Secondary | ICD-10-CM | POA: Diagnosis not present

## 2018-04-01 DIAGNOSIS — G20 Parkinson's disease: Secondary | ICD-10-CM | POA: Diagnosis not present

## 2018-04-01 DIAGNOSIS — N183 Chronic kidney disease, stage 3 (moderate): Secondary | ICD-10-CM | POA: Diagnosis not present

## 2018-04-02 DIAGNOSIS — E1122 Type 2 diabetes mellitus with diabetic chronic kidney disease: Secondary | ICD-10-CM | POA: Diagnosis not present

## 2018-04-02 DIAGNOSIS — M1612 Unilateral primary osteoarthritis, left hip: Secondary | ICD-10-CM | POA: Diagnosis not present

## 2018-04-02 DIAGNOSIS — Z7901 Long term (current) use of anticoagulants: Secondary | ICD-10-CM | POA: Diagnosis not present

## 2018-04-02 DIAGNOSIS — D631 Anemia in chronic kidney disease: Secondary | ICD-10-CM | POA: Diagnosis not present

## 2018-04-02 DIAGNOSIS — I130 Hypertensive heart and chronic kidney disease with heart failure and stage 1 through stage 4 chronic kidney disease, or unspecified chronic kidney disease: Secondary | ICD-10-CM | POA: Diagnosis not present

## 2018-04-02 DIAGNOSIS — L97511 Non-pressure chronic ulcer of other part of right foot limited to breakdown of skin: Secondary | ICD-10-CM | POA: Diagnosis not present

## 2018-04-02 DIAGNOSIS — G4733 Obstructive sleep apnea (adult) (pediatric): Secondary | ICD-10-CM | POA: Diagnosis not present

## 2018-04-02 DIAGNOSIS — G20 Parkinson's disease: Secondary | ICD-10-CM | POA: Diagnosis not present

## 2018-04-02 DIAGNOSIS — Z95810 Presence of automatic (implantable) cardiac defibrillator: Secondary | ICD-10-CM | POA: Diagnosis not present

## 2018-04-02 DIAGNOSIS — N183 Chronic kidney disease, stage 3 (moderate): Secondary | ICD-10-CM | POA: Diagnosis not present

## 2018-04-02 DIAGNOSIS — M48061 Spinal stenosis, lumbar region without neurogenic claudication: Secondary | ICD-10-CM | POA: Diagnosis not present

## 2018-04-02 DIAGNOSIS — I2510 Atherosclerotic heart disease of native coronary artery without angina pectoris: Secondary | ICD-10-CM | POA: Diagnosis not present

## 2018-04-02 DIAGNOSIS — I4819 Other persistent atrial fibrillation: Secondary | ICD-10-CM | POA: Diagnosis not present

## 2018-04-02 DIAGNOSIS — Z9181 History of falling: Secondary | ICD-10-CM | POA: Diagnosis not present

## 2018-04-02 DIAGNOSIS — E039 Hypothyroidism, unspecified: Secondary | ICD-10-CM | POA: Diagnosis not present

## 2018-04-02 DIAGNOSIS — Z7984 Long term (current) use of oral hypoglycemic drugs: Secondary | ICD-10-CM | POA: Diagnosis not present

## 2018-04-02 DIAGNOSIS — E11621 Type 2 diabetes mellitus with foot ulcer: Secondary | ICD-10-CM | POA: Diagnosis not present

## 2018-04-02 DIAGNOSIS — M170 Bilateral primary osteoarthritis of knee: Secondary | ICD-10-CM | POA: Diagnosis not present

## 2018-04-02 DIAGNOSIS — I429 Cardiomyopathy, unspecified: Secondary | ICD-10-CM | POA: Diagnosis not present

## 2018-04-02 DIAGNOSIS — I5023 Acute on chronic systolic (congestive) heart failure: Secondary | ICD-10-CM | POA: Diagnosis not present

## 2018-04-03 DIAGNOSIS — N183 Chronic kidney disease, stage 3 (moderate): Secondary | ICD-10-CM | POA: Diagnosis not present

## 2018-04-03 DIAGNOSIS — L97511 Non-pressure chronic ulcer of other part of right foot limited to breakdown of skin: Secondary | ICD-10-CM | POA: Diagnosis not present

## 2018-04-03 DIAGNOSIS — I4819 Other persistent atrial fibrillation: Secondary | ICD-10-CM | POA: Diagnosis not present

## 2018-04-03 DIAGNOSIS — E11621 Type 2 diabetes mellitus with foot ulcer: Secondary | ICD-10-CM | POA: Diagnosis not present

## 2018-04-03 DIAGNOSIS — E039 Hypothyroidism, unspecified: Secondary | ICD-10-CM | POA: Diagnosis not present

## 2018-04-03 DIAGNOSIS — I130 Hypertensive heart and chronic kidney disease with heart failure and stage 1 through stage 4 chronic kidney disease, or unspecified chronic kidney disease: Secondary | ICD-10-CM | POA: Diagnosis not present

## 2018-04-03 DIAGNOSIS — M170 Bilateral primary osteoarthritis of knee: Secondary | ICD-10-CM | POA: Diagnosis not present

## 2018-04-03 DIAGNOSIS — G20 Parkinson's disease: Secondary | ICD-10-CM | POA: Diagnosis not present

## 2018-04-03 DIAGNOSIS — I5023 Acute on chronic systolic (congestive) heart failure: Secondary | ICD-10-CM | POA: Diagnosis not present

## 2018-04-03 DIAGNOSIS — Z7901 Long term (current) use of anticoagulants: Secondary | ICD-10-CM | POA: Diagnosis not present

## 2018-04-03 DIAGNOSIS — Z9181 History of falling: Secondary | ICD-10-CM | POA: Diagnosis not present

## 2018-04-03 DIAGNOSIS — M48061 Spinal stenosis, lumbar region without neurogenic claudication: Secondary | ICD-10-CM | POA: Diagnosis not present

## 2018-04-03 DIAGNOSIS — I2510 Atherosclerotic heart disease of native coronary artery without angina pectoris: Secondary | ICD-10-CM | POA: Diagnosis not present

## 2018-04-03 DIAGNOSIS — Z7984 Long term (current) use of oral hypoglycemic drugs: Secondary | ICD-10-CM | POA: Diagnosis not present

## 2018-04-03 DIAGNOSIS — I429 Cardiomyopathy, unspecified: Secondary | ICD-10-CM | POA: Diagnosis not present

## 2018-04-03 DIAGNOSIS — D631 Anemia in chronic kidney disease: Secondary | ICD-10-CM | POA: Diagnosis not present

## 2018-04-03 DIAGNOSIS — M1612 Unilateral primary osteoarthritis, left hip: Secondary | ICD-10-CM | POA: Diagnosis not present

## 2018-04-03 DIAGNOSIS — G4733 Obstructive sleep apnea (adult) (pediatric): Secondary | ICD-10-CM | POA: Diagnosis not present

## 2018-04-03 DIAGNOSIS — Z95810 Presence of automatic (implantable) cardiac defibrillator: Secondary | ICD-10-CM | POA: Diagnosis not present

## 2018-04-03 DIAGNOSIS — E1122 Type 2 diabetes mellitus with diabetic chronic kidney disease: Secondary | ICD-10-CM | POA: Diagnosis not present

## 2018-04-05 DIAGNOSIS — S76111D Strain of right quadriceps muscle, fascia and tendon, subsequent encounter: Secondary | ICD-10-CM | POA: Diagnosis not present

## 2018-04-05 DIAGNOSIS — I5032 Chronic diastolic (congestive) heart failure: Secondary | ICD-10-CM | POA: Diagnosis not present

## 2018-04-05 DIAGNOSIS — M6281 Muscle weakness (generalized): Secondary | ICD-10-CM | POA: Diagnosis not present

## 2018-04-05 DIAGNOSIS — M48061 Spinal stenosis, lumbar region without neurogenic claudication: Secondary | ICD-10-CM | POA: Diagnosis not present

## 2018-04-07 ENCOUNTER — Telehealth: Payer: Self-pay | Admitting: Family Medicine

## 2018-04-07 ENCOUNTER — Ambulatory Visit (HOSPITAL_BASED_OUTPATIENT_CLINIC_OR_DEPARTMENT_OTHER)
Admission: RE | Admit: 2018-04-07 | Discharge: 2018-04-07 | Disposition: A | Discharge status: Home / Self Care | Payer: Medicare Other | Source: Ambulatory Visit | Attending: Cardiology | Admitting: Cardiology

## 2018-04-07 ENCOUNTER — Ambulatory Visit (HOSPITAL_COMMUNITY)
Admission: RE | Admit: 2018-04-07 | Discharge: 2018-04-07 | Disposition: A | Discharge status: Home / Self Care | Payer: Medicare Other | Source: Ambulatory Visit | Attending: Cardiology | Admitting: Cardiology

## 2018-04-07 VITALS — BP 112/60 | HR 72 | Wt 192.8 lb

## 2018-04-07 DIAGNOSIS — I110 Hypertensive heart disease with heart failure: Secondary | ICD-10-CM | POA: Insufficient documentation

## 2018-04-07 DIAGNOSIS — I428 Other cardiomyopathies: Secondary | ICD-10-CM

## 2018-04-07 DIAGNOSIS — I25708 Atherosclerosis of coronary artery bypass graft(s), unspecified, with other forms of angina pectoris: Secondary | ICD-10-CM

## 2018-04-07 DIAGNOSIS — I5022 Chronic systolic (congestive) heart failure: Secondary | ICD-10-CM

## 2018-04-07 DIAGNOSIS — L97519 Non-pressure chronic ulcer of other part of right foot with unspecified severity: Secondary | ICD-10-CM

## 2018-04-07 DIAGNOSIS — R0609 Other forms of dyspnea: Secondary | ICD-10-CM

## 2018-04-07 DIAGNOSIS — R259 Unspecified abnormal involuntary movements: Secondary | ICD-10-CM

## 2018-04-07 DIAGNOSIS — I080 Rheumatic disorders of both mitral and aortic valves: Secondary | ICD-10-CM | POA: Insufficient documentation

## 2018-04-07 DIAGNOSIS — I4819 Other persistent atrial fibrillation: Secondary | ICD-10-CM

## 2018-04-07 DIAGNOSIS — I482 Chronic atrial fibrillation, unspecified: Secondary | ICD-10-CM | POA: Diagnosis not present

## 2018-04-07 DIAGNOSIS — E785 Hyperlipidemia, unspecified: Secondary | ICD-10-CM | POA: Insufficient documentation

## 2018-04-07 DIAGNOSIS — L97512 Non-pressure chronic ulcer of other part of right foot with fat layer exposed: Secondary | ICD-10-CM

## 2018-04-07 DIAGNOSIS — E08621 Diabetes mellitus due to underlying condition with foot ulcer: Secondary | ICD-10-CM

## 2018-04-07 DIAGNOSIS — I2510 Atherosclerotic heart disease of native coronary artery without angina pectoris: Secondary | ICD-10-CM | POA: Insufficient documentation

## 2018-04-07 DIAGNOSIS — G4733 Obstructive sleep apnea (adult) (pediatric): Secondary | ICD-10-CM

## 2018-04-07 DIAGNOSIS — N183 Chronic kidney disease, stage 3 unspecified: Secondary | ICD-10-CM

## 2018-04-07 DIAGNOSIS — M6281 Muscle weakness (generalized): Secondary | ICD-10-CM

## 2018-04-07 DIAGNOSIS — E1122 Type 2 diabetes mellitus with diabetic chronic kidney disease: Secondary | ICD-10-CM

## 2018-04-07 LAB — PROTIME-INR
INR: 1.52
PROTHROMBIN TIME: 18.2 s — AB (ref 11.4–15.2)

## 2018-04-07 LAB — CBC
HCT: 40.9 % (ref 39.0–52.0)
Hemoglobin: 12.9 g/dL — ABNORMAL LOW (ref 13.0–17.0)
MCH: 30 pg (ref 26.0–34.0)
MCHC: 31.5 g/dL (ref 30.0–36.0)
MCV: 95.1 fL (ref 80.0–100.0)
NRBC: 0 % (ref 0.0–0.2)
PLATELETS: 156 10*3/uL (ref 150–400)
RBC: 4.3 MIL/uL (ref 4.22–5.81)
RDW: 14 % (ref 11.5–15.5)
WBC: 6.2 10*3/uL (ref 4.0–10.5)

## 2018-04-07 LAB — DIGOXIN LEVEL: DIGOXIN LVL: 0.3 ng/mL — AB (ref 0.8–2.0)

## 2018-04-07 LAB — BASIC METABOLIC PANEL
Anion gap: 9 (ref 5–15)
BUN: 19 mg/dL (ref 8–23)
CO2: 31 mmol/L (ref 22–32)
CREATININE: 1.08 mg/dL (ref 0.61–1.24)
Calcium: 9.2 mg/dL (ref 8.9–10.3)
Chloride: 101 mmol/L (ref 98–111)
GFR calc Af Amer: >60 mL/min (ref >60)
Glucose, Bld: 122 mg/dL — ABNORMAL HIGH (ref 70–99)
Potassium: 4 mmol/L (ref 3.5–5.1)
Sodium: 141 mmol/L (ref 135–145)

## 2018-04-07 MED ORDER — MIDODRINE HCL 5 MG PO TABS: 5.0000 mg | ORAL_TABLET | Freq: Three times a day (TID) | ORAL | 6 refills | Status: DC

## 2018-04-07 NOTE — Patient Instructions (Signed)
Increase Midodrine to 5 mg Three times a day   Labs today  Your physician recommends that you schedule a follow-up appointment in: 6 weeks with APP  Your physician recommends that you schedule a follow-up appointment in: 3-4 months with Dr Shirlee Latch

## 2018-04-07 NOTE — Progress Notes (Signed)
  Echocardiogram 2D Echocardiogram has been performed.  [name] [name] 04/07/2018, 2:52 PM

## 2018-04-07 NOTE — Telephone Encounter (Signed)
Nurse From [facility] asked for a order to be sent in for the care of [name] [name].  Occupational health/ Medical Child psychotherapist.  Call back number [phone]  Please advise

## 2018-04-08 DIAGNOSIS — E11621 Type 2 diabetes mellitus with foot ulcer: Secondary | ICD-10-CM | POA: Diagnosis not present

## 2018-04-08 DIAGNOSIS — Z95810 Presence of automatic (implantable) cardiac defibrillator: Secondary | ICD-10-CM | POA: Diagnosis not present

## 2018-04-08 DIAGNOSIS — M170 Bilateral primary osteoarthritis of knee: Secondary | ICD-10-CM | POA: Diagnosis not present

## 2018-04-08 DIAGNOSIS — G20 Parkinson's disease: Secondary | ICD-10-CM | POA: Diagnosis not present

## 2018-04-08 DIAGNOSIS — Z9181 History of falling: Secondary | ICD-10-CM | POA: Diagnosis not present

## 2018-04-08 DIAGNOSIS — D631 Anemia in chronic kidney disease: Secondary | ICD-10-CM | POA: Diagnosis not present

## 2018-04-08 DIAGNOSIS — I2510 Atherosclerotic heart disease of native coronary artery without angina pectoris: Secondary | ICD-10-CM | POA: Diagnosis not present

## 2018-04-08 DIAGNOSIS — E039 Hypothyroidism, unspecified: Secondary | ICD-10-CM | POA: Diagnosis not present

## 2018-04-08 DIAGNOSIS — G4733 Obstructive sleep apnea (adult) (pediatric): Secondary | ICD-10-CM | POA: Diagnosis not present

## 2018-04-08 DIAGNOSIS — N183 Chronic kidney disease, stage 3 (moderate): Secondary | ICD-10-CM | POA: Diagnosis not present

## 2018-04-08 DIAGNOSIS — L84 Corns and callosities: Secondary | ICD-10-CM | POA: Diagnosis not present

## 2018-04-08 DIAGNOSIS — Z7984 Long term (current) use of oral hypoglycemic drugs: Secondary | ICD-10-CM | POA: Diagnosis not present

## 2018-04-08 DIAGNOSIS — E1122 Type 2 diabetes mellitus with diabetic chronic kidney disease: Secondary | ICD-10-CM | POA: Diagnosis not present

## 2018-04-08 DIAGNOSIS — M48061 Spinal stenosis, lumbar region without neurogenic claudication: Secondary | ICD-10-CM | POA: Diagnosis not present

## 2018-04-08 DIAGNOSIS — I130 Hypertensive heart and chronic kidney disease with heart failure and stage 1 through stage 4 chronic kidney disease, or unspecified chronic kidney disease: Secondary | ICD-10-CM | POA: Diagnosis not present

## 2018-04-08 DIAGNOSIS — Z7901 Long term (current) use of anticoagulants: Secondary | ICD-10-CM | POA: Diagnosis not present

## 2018-04-08 DIAGNOSIS — I5023 Acute on chronic systolic (congestive) heart failure: Secondary | ICD-10-CM | POA: Diagnosis not present

## 2018-04-08 DIAGNOSIS — L97511 Non-pressure chronic ulcer of other part of right foot limited to breakdown of skin: Secondary | ICD-10-CM | POA: Diagnosis not present

## 2018-04-08 DIAGNOSIS — I4819 Other persistent atrial fibrillation: Secondary | ICD-10-CM | POA: Diagnosis not present

## 2018-04-08 DIAGNOSIS — E1142 Type 2 diabetes mellitus with diabetic polyneuropathy: Secondary | ICD-10-CM | POA: Diagnosis not present

## 2018-04-08 DIAGNOSIS — M1612 Unilateral primary osteoarthritis, left hip: Secondary | ICD-10-CM | POA: Diagnosis not present

## 2018-04-08 DIAGNOSIS — I429 Cardiomyopathy, unspecified: Secondary | ICD-10-CM | POA: Diagnosis not present

## 2018-04-08 NOTE — Progress Notes (Signed)
Patient ID: [name] [name], male   DOB: Oct 27, 1931, 84 y.o.   MRN: [id number]     Advanced Heart Failure Clinic Note   Primary Cardiologist: Dr Mayford Knife PCP: Dr Denyse Amass HF Cardiology: Shirlee Latch  HPI: [name] [name] is a 84 y.o. male with PMH of A tach s/p DC-CV 12/29/12, CAD, PAF on chronic coumadin and amiodarone, OSA- CPAP, NICM cath 2008, chronic systolic heart failure EF 25-30% (01/2013) , S/P Medtronic CRT-D 2008 and generator changed 2012, LBBB, CRI (baseline 1.7-1.8) and Hypothyroidism.   He is S/P DC-CV 12/29/12 and initially he felt good for about a week. He had a functional decline and was only able to walk a few steps. He presented [facility] ED 01/22/13 with increased dyspnea on exertion and low extremity edema. He was started on a lasix gtt 10 mg hr. Admit weight 233 lbs. Discharge weight: 214 lbs.    He has been off Coreg due to fatigue.  He is now off amiodarone due to imbalance/gait instability.   He was started on Sinemet by neurology for suspected Parkinsons-type syndrome.    In 5/19, he was noted to be orthostatic and bisoprolol, ramipril, and spironolactone were stopped.  He was noted to be in an atrial tachycardia at that time . Device interrogation today shows persistent atrial fibrillation since early 7/19.   Patient presents for followup of CHF.   Weight down 21 lbs since last appointment in 1/19.  Legs are "weak" and he gets lightheaded if he stands too fast.  Has had falls.  He uses a [employer].  He was orthostatic when checked in the office today.  His movement is significantly slowed, he though Sinemet helped at first, but it does not seem to be helping now. No chest pain, no dypsnea.  Just has profound fatigue with exertion.  Echo 10/31/2016 LVEF 35-40%, Mod AI, Mod MR, Severe LAE, Moderate RVH, Severe RAE, PA peak pressure 19 mm Hg.  Echo (10/19) with EF 30-35%, mild LV dilation, normal RV size with mildly decreased systolic function, mild MR, mild AI. PYP scan (10/19) not suggestive of  cardiac amyloidosis.   On midodrine, BP is higher.  No falls, no lightheadedness with standing.  Still not very active.  He fatigues easily.  Not short of breath walking with [place] in his house.  Weight down 4 lbs. No chest pain.  No melena/BRBPR. Activity continues to be limited by parkinsonism.     Labs 8/21: Na 132 K 4.5 Cr 1.7 (stable)  Labs 03/02/13 NA 135 Potassium 4.6 Creatinine 1.6 Dig 1.6 TSH 3.95        04/02/13: digoxin 0.8, K+ 4.5, creatinine 1.48         09/08/13 K 4.4 Creatine 1.7          6/18: K 4, creatinine 1.32, TSH normal, hgb 12.9         9/18: TSH normal, K 4.4, creatinine 1.37, LFTs normal         11/18: K 4.1, creatinine 1.47, digoxin 0.3          8/19: digoxin < 0.5, K 4.2, creatinine 1.26, Hgb 13.2         Review of systems complete and found to be negative unless listed in HPI.    Past Medical History:  Diagnosis Date  . Anemia, unspecified 04/07/2013  . Atrial fibrillation (HCC)    fibrillation/flutter  . BPH (benign prostatic hypertrophy)   . Chronic systolic CHF (congestive heart failure) (HCC)    Nonischemic  DCM EF 10% s/p BiV AICD  . Coronary artery disease 2008   nonobstructive ASCAD  . Depression   . Hyperlipidemia   . Hypertension   . Hypothyroidism   . Left bundle branch block   . Nonischemic cardiomyopathy (HCC)    ef 10 %  . OSA (obstructive sleep apnea)    intolerant to CPAP  . Osteoarthritis   . Sleep apnea    stopped using CPAP  . Thrombocytopenia (HCC)     Current Outpatient Medications  Medication Sig Dispense Refill  . acetaminophen (TYLENOL) 650 MG CR tablet Take 1 tablet (650 mg total) by mouth every 8 (eight) hours as needed for pain. 90 tablet 3  . AMBULATORY NON FORMULARY MEDICATION Lancets pen type sufficient for three times daily checking. Dispense 3 month supply E11.65 1 each 12  . AMBULATORY NON FORMULARY MEDICATION Test strips for One Touch Verio Flex meter.  Test 3x daily.  Disp 3 month supply.  E11.65 1 each 12  .  AMBULATORY NON FORMULARY MEDICATION order to [facility] for CNA to come into home and help give baths.order for social work to come into home to help with services. [facility] number is [phone] 1 each 0  . carbidopa-levodopa (SINEMET) 25-100 MG tablet Take 1 tablet by mouth 3 (three) times daily.    . diclofenac sodium (VOLTAREN) 1 % GEL Apply 4 g topically 4 (four) times daily. To affected joint. 1440 g 3  . digoxin (LANOXIN) 0.125 MG tablet TAKE 1 TABLET BY MOUTH  EVERY OTHER DAY 45 tablet 3  . glimepiride (AMARYL) 1 MG tablet Take 1 tablet (1 mg total) by mouth daily before breakfast. 90 tablet 1  . levothyroxine (SYNTHROID, LEVOTHROID) 137 MCG tablet TAKE 1 TABLET BY MOUTH  DAILY BEFORE BREAKFAST 90 tablet 1  . metFORMIN (GLUCOPHAGE) 500 MG tablet Take 1 tablet (500 mg total) by mouth 2 (two) times daily with a meal. 180 tablet 1  . midodrine (PROAMATINE) 5 MG tablet Take 1 tablet (5 mg total) by mouth 3 (three) times daily with meals. 90 tablet 6  . mupirocin ointment (BACTROBAN) 2 % Apply to affected area BID for 7 days. 30 g 3  . nystatin cream (MYCOSTATIN) Apply 1 application topically 2 (two) times daily. 60 g 2  . Nystatin POWD Apply liberally to affected area 2 times per day 1 Bottle 11  . potassium chloride SA (K-DUR,KLOR-CON) 20 MEQ tablet Take 1 tablet (20 mEq total) by mouth daily. 90 tablet 3  . simvastatin (ZOCOR) 10 MG tablet TAKE 1 TABLET BY MOUTH  EVERY EVENING 90 tablet 3  . torsemide (DEMADEX) 20 MG tablet Take 1 tablet (20 mg total) by mouth daily. 30 tablet 0  . warfarin (COUMADIN) 1 MG tablet Take 1 mg pill on Monday, Wednesday, Thursday and Friday in addition to 5mg  dose 30 tablet 2  . warfarin (COUMADIN) 5 MG tablet TAKE 1 TABLET BY MOUTH  EVERY DAY 90 tablet 3   No current facility-administered medications for this encounter.     Vitals:   04/07/18 1451  BP: 112/60  Pulse: 72  SpO2: 98%  Weight: 87.5 kg (192 lb 12.8 oz)   Wt Readings from Last 3  Encounters:  04/07/18 87.5 kg (192 lb 12.8 oz)  03/27/18 88.5 kg (195 lb)  03/26/18 88.5 kg (195 lb)    PHYSICAL EXAM: General: NAD, frail Neck: No JVD, no thyromegaly or thyroid nodule.  Lungs: Clear to auscultation bilaterally with normal respiratory effort.  CV: Nondisplaced PMI.  Heart regular S1/S2, no S3/S4, no murmur.  No peripheral edema.  No carotid bruit.  Normal pedal pulses.  Abdomen: Soft, nontender, no hepatosplenomegaly, no distention.  Skin: Intact without lesions or rashes.  Neurologic: Alert and oriented x 3.  Psych: Normal affect. Extremities: No clubbing or cyanosis.  HEENT: Normal.   ASSESSMENT & PLAN:  1. Chronic systolic HF: NICM 01/2013 EF 20%; Medtronic CRT-D.  Most recent echo 10/19 was reviewed today, showing EF 30-35%, mild LV dilation, normal RV size with mildly decreased RV systolic function.  NYHA class III symptoms with prominent fatigue, possibly more related to deconditioning and autonomic neuropathy than CHF.  He is not volume overloaded on exam. - Continue torsemide 20 mg daily, BMET today.   - Stay off spironolactone, ramipril, and bisoprolol => due to orthostasis and falls.   - Continue digoxin, check level today.  2) OSA: Continue nightly CPAP 3) Atrial fibrillation: This is persistent, has been present since early 7/19 continuously.  He cannot take amiodarone due to prior toxicity.  He has seen Dr. Graciela Husbands who thought rate control + anticoagulation was the best policy.  - Continue warfarin.   4) Orthostatic hypotension: Suspect autonomic neuropathy.  May be part of the parkinsons picture.  BP/symptoms have improved on midodrine.  - Wear compression stockings.  - Increase midodrine to 5 mg tid.  - Cardiac amyloidosis is also a consideration with CHF and autonomic neuropathy.  Cannot get MRI with CRT-D device. PYP scan from 10/19 is not suggestive of transthyretin amyloidosis.  Pending myeloma panel.  5) Parkinsons - type syndrome: Sinemet was initially  helping, but not so much now.  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   6) Diabetes: If an addiitonal medications are needed, would consider SGLT2-inhibitor.   Followup in 6 wks APP, followup in 3 months with me.   Marca Ancona, MD  04/08/2018

## 2018-04-09 ENCOUNTER — Telehealth: Payer: Self-pay | Admitting: Family Medicine

## 2018-04-09 NOTE — Telephone Encounter (Signed)
Order placed

## 2018-04-09 NOTE — Telephone Encounter (Signed)
[facility] Advised.

## 2018-04-09 NOTE — Telephone Encounter (Signed)
Fax received from [employer] regarding diabetic shoes.  Faxed back order signed with recent office notes per request.

## 2018-04-09 NOTE — Addendum Note (Signed)
Addended by: Rodolph Bong on: 04/09/2018 12:35 PM   Modules accepted: Orders

## 2018-04-10 ENCOUNTER — Telehealth (HOSPITAL_COMMUNITY): Payer: Self-pay | Admitting: Surgery

## 2018-04-10 DIAGNOSIS — Z7984 Long term (current) use of oral hypoglycemic drugs: Secondary | ICD-10-CM | POA: Diagnosis not present

## 2018-04-10 DIAGNOSIS — E039 Hypothyroidism, unspecified: Secondary | ICD-10-CM | POA: Diagnosis not present

## 2018-04-10 DIAGNOSIS — M1612 Unilateral primary osteoarthritis, left hip: Secondary | ICD-10-CM | POA: Diagnosis not present

## 2018-04-10 DIAGNOSIS — Z7901 Long term (current) use of anticoagulants: Secondary | ICD-10-CM | POA: Diagnosis not present

## 2018-04-10 DIAGNOSIS — M170 Bilateral primary osteoarthritis of knee: Secondary | ICD-10-CM | POA: Diagnosis not present

## 2018-04-10 DIAGNOSIS — I429 Cardiomyopathy, unspecified: Secondary | ICD-10-CM | POA: Diagnosis not present

## 2018-04-10 DIAGNOSIS — G20 Parkinson's disease: Secondary | ICD-10-CM | POA: Diagnosis not present

## 2018-04-10 DIAGNOSIS — I130 Hypertensive heart and chronic kidney disease with heart failure and stage 1 through stage 4 chronic kidney disease, or unspecified chronic kidney disease: Secondary | ICD-10-CM | POA: Diagnosis not present

## 2018-04-10 DIAGNOSIS — D631 Anemia in chronic kidney disease: Secondary | ICD-10-CM | POA: Diagnosis not present

## 2018-04-10 DIAGNOSIS — L97511 Non-pressure chronic ulcer of other part of right foot limited to breakdown of skin: Secondary | ICD-10-CM | POA: Diagnosis not present

## 2018-04-10 DIAGNOSIS — E11621 Type 2 diabetes mellitus with foot ulcer: Secondary | ICD-10-CM | POA: Diagnosis not present

## 2018-04-10 DIAGNOSIS — Z95810 Presence of automatic (implantable) cardiac defibrillator: Secondary | ICD-10-CM | POA: Diagnosis not present

## 2018-04-10 DIAGNOSIS — M48061 Spinal stenosis, lumbar region without neurogenic claudication: Secondary | ICD-10-CM | POA: Diagnosis not present

## 2018-04-10 DIAGNOSIS — N183 Chronic kidney disease, stage 3 (moderate): Secondary | ICD-10-CM | POA: Diagnosis not present

## 2018-04-10 DIAGNOSIS — Z9181 History of falling: Secondary | ICD-10-CM | POA: Diagnosis not present

## 2018-04-10 DIAGNOSIS — I4819 Other persistent atrial fibrillation: Secondary | ICD-10-CM | POA: Diagnosis not present

## 2018-04-10 DIAGNOSIS — E1122 Type 2 diabetes mellitus with diabetic chronic kidney disease: Secondary | ICD-10-CM | POA: Diagnosis not present

## 2018-04-10 DIAGNOSIS — I2510 Atherosclerotic heart disease of native coronary artery without angina pectoris: Secondary | ICD-10-CM | POA: Diagnosis not present

## 2018-04-10 DIAGNOSIS — I5022 Chronic systolic (congestive) heart failure: Secondary | ICD-10-CM

## 2018-04-10 DIAGNOSIS — I5023 Acute on chronic systolic (congestive) heart failure: Secondary | ICD-10-CM | POA: Diagnosis not present

## 2018-04-10 DIAGNOSIS — G4733 Obstructive sleep apnea (adult) (pediatric): Secondary | ICD-10-CM | POA: Diagnosis not present

## 2018-04-10 NOTE — Telephone Encounter (Signed)
Patient called to make schedule appt. for Myeloma panel.  He requests that labs be drawn at his upcoming appt on 10/29 in another clinic.  Order placed for lab.

## 2018-04-11 ENCOUNTER — Other Ambulatory Visit: Payer: Self-pay | Admitting: Family Medicine

## 2018-04-11 ENCOUNTER — Telehealth: Payer: Self-pay

## 2018-04-11 ENCOUNTER — Telehealth (HOSPITAL_COMMUNITY): Payer: Self-pay | Admitting: Pharmacist

## 2018-04-11 DIAGNOSIS — E039 Hypothyroidism, unspecified: Secondary | ICD-10-CM

## 2018-04-11 NOTE — Telephone Encounter (Signed)
[name] [name] called stating that he was charged $125 for his midodrine by [facility] mail order pharmacy and he cannot afford that. They have already shipped him the medication and he will have a hold on his account until the $125 is paid. Generally, the mail order pharmacies are not allowed to ship the medication until the patient agrees to pay the copay. I will call [facility] and try to get some more information about what happened. I have also left [name] a message to call [facility] to get some clarification as well.   Tyler Deis. Bonnye Fava, PharmD, BCPS, CPP Clinical Pharmacist Phone: [phone] 04/11/2018 4:24 PM

## 2018-04-11 NOTE — Telephone Encounter (Signed)
Sle[facility]'s INR was 1.5 yesterday.

## 2018-04-14 DIAGNOSIS — Z95810 Presence of automatic (implantable) cardiac defibrillator: Secondary | ICD-10-CM | POA: Diagnosis not present

## 2018-04-14 DIAGNOSIS — E1122 Type 2 diabetes mellitus with diabetic chronic kidney disease: Secondary | ICD-10-CM | POA: Diagnosis not present

## 2018-04-14 DIAGNOSIS — I4819 Other persistent atrial fibrillation: Secondary | ICD-10-CM | POA: Diagnosis not present

## 2018-04-14 DIAGNOSIS — I5023 Acute on chronic systolic (congestive) heart failure: Secondary | ICD-10-CM | POA: Diagnosis not present

## 2018-04-14 DIAGNOSIS — G4733 Obstructive sleep apnea (adult) (pediatric): Secondary | ICD-10-CM | POA: Diagnosis not present

## 2018-04-14 DIAGNOSIS — N183 Chronic kidney disease, stage 3 (moderate): Secondary | ICD-10-CM | POA: Diagnosis not present

## 2018-04-14 DIAGNOSIS — I2510 Atherosclerotic heart disease of native coronary artery without angina pectoris: Secondary | ICD-10-CM | POA: Diagnosis not present

## 2018-04-14 DIAGNOSIS — I130 Hypertensive heart and chronic kidney disease with heart failure and stage 1 through stage 4 chronic kidney disease, or unspecified chronic kidney disease: Secondary | ICD-10-CM | POA: Diagnosis not present

## 2018-04-14 DIAGNOSIS — M170 Bilateral primary osteoarthritis of knee: Secondary | ICD-10-CM | POA: Diagnosis not present

## 2018-04-14 DIAGNOSIS — D631 Anemia in chronic kidney disease: Secondary | ICD-10-CM | POA: Diagnosis not present

## 2018-04-14 DIAGNOSIS — E039 Hypothyroidism, unspecified: Secondary | ICD-10-CM | POA: Diagnosis not present

## 2018-04-14 DIAGNOSIS — I429 Cardiomyopathy, unspecified: Secondary | ICD-10-CM | POA: Diagnosis not present

## 2018-04-14 DIAGNOSIS — E11621 Type 2 diabetes mellitus with foot ulcer: Secondary | ICD-10-CM | POA: Diagnosis not present

## 2018-04-14 DIAGNOSIS — M1612 Unilateral primary osteoarthritis, left hip: Secondary | ICD-10-CM | POA: Diagnosis not present

## 2018-04-14 DIAGNOSIS — Z9181 History of falling: Secondary | ICD-10-CM | POA: Diagnosis not present

## 2018-04-14 DIAGNOSIS — Z7901 Long term (current) use of anticoagulants: Secondary | ICD-10-CM | POA: Diagnosis not present

## 2018-04-14 DIAGNOSIS — G20 Parkinson's disease: Secondary | ICD-10-CM | POA: Diagnosis not present

## 2018-04-14 DIAGNOSIS — M48061 Spinal stenosis, lumbar region without neurogenic claudication: Secondary | ICD-10-CM | POA: Diagnosis not present

## 2018-04-14 DIAGNOSIS — Z7984 Long term (current) use of oral hypoglycemic drugs: Secondary | ICD-10-CM | POA: Diagnosis not present

## 2018-04-14 DIAGNOSIS — L97511 Non-pressure chronic ulcer of other part of right foot limited to breakdown of skin: Secondary | ICD-10-CM | POA: Diagnosis not present

## 2018-04-14 NOTE — Addendum Note (Signed)
Addended by: Elvin So on: 04/14/2018 04:05 PM   Modules accepted: Orders

## 2018-04-14 NOTE — Telephone Encounter (Signed)
Ms. Yale called back stating that [facility] called her again and they have no record of a tier exception and she is getting very frustrated and upset with the process. I assured her that I would take care of it from here and not to worry about it. She also stated that Mr. Mccaster did not increase his midodrine dose from 2.5 mg TID to 5 mg TID since they could not get the higher dose and he has not felt dizzy and his BP remains in the 100-120 range when the [facility] comes out to their home. I have advised her to just keep him on the 2.5 mg TID for now.   Tyler Deis. Bonnye Fava, PharmD, BCPS, CPP Clinical Pharmacist Phone: [phone] 04/14/2018 4:04 PM

## 2018-04-14 NOTE — Telephone Encounter (Addendum)
Spoke with Mrs. Longino who started a tier exception for the midodrine. I have called [employer] to complete the tier exception so we should be getting a decision via fax within 72 hours.   [id number]  Tyler Deis. Bonnye Fava, PharmD, BCPS, CPP Clinical Pharmacist Phone: [phone] 04/14/2018 9:06 AM

## 2018-04-14 NOTE — Telephone Encounter (Signed)
We will recheck on next visit at end of October.

## 2018-04-15 ENCOUNTER — Telehealth (HOSPITAL_COMMUNITY): Payer: Self-pay | Admitting: Surgery

## 2018-04-15 DIAGNOSIS — M48061 Spinal stenosis, lumbar region without neurogenic claudication: Secondary | ICD-10-CM | POA: Diagnosis not present

## 2018-04-15 DIAGNOSIS — M170 Bilateral primary osteoarthritis of knee: Secondary | ICD-10-CM | POA: Diagnosis not present

## 2018-04-15 DIAGNOSIS — I4819 Other persistent atrial fibrillation: Secondary | ICD-10-CM | POA: Diagnosis not present

## 2018-04-15 DIAGNOSIS — Z9181 History of falling: Secondary | ICD-10-CM | POA: Diagnosis not present

## 2018-04-15 DIAGNOSIS — I5023 Acute on chronic systolic (congestive) heart failure: Secondary | ICD-10-CM | POA: Diagnosis not present

## 2018-04-15 DIAGNOSIS — Z7901 Long term (current) use of anticoagulants: Secondary | ICD-10-CM | POA: Diagnosis not present

## 2018-04-15 DIAGNOSIS — G4733 Obstructive sleep apnea (adult) (pediatric): Secondary | ICD-10-CM | POA: Diagnosis not present

## 2018-04-15 DIAGNOSIS — E1122 Type 2 diabetes mellitus with diabetic chronic kidney disease: Secondary | ICD-10-CM | POA: Diagnosis not present

## 2018-04-15 DIAGNOSIS — I429 Cardiomyopathy, unspecified: Secondary | ICD-10-CM | POA: Diagnosis not present

## 2018-04-15 DIAGNOSIS — M1612 Unilateral primary osteoarthritis, left hip: Secondary | ICD-10-CM | POA: Diagnosis not present

## 2018-04-15 DIAGNOSIS — D631 Anemia in chronic kidney disease: Secondary | ICD-10-CM | POA: Diagnosis not present

## 2018-04-15 DIAGNOSIS — Z95810 Presence of automatic (implantable) cardiac defibrillator: Secondary | ICD-10-CM | POA: Diagnosis not present

## 2018-04-15 DIAGNOSIS — L97511 Non-pressure chronic ulcer of other part of right foot limited to breakdown of skin: Secondary | ICD-10-CM | POA: Diagnosis not present

## 2018-04-15 DIAGNOSIS — I2510 Atherosclerotic heart disease of native coronary artery without angina pectoris: Secondary | ICD-10-CM | POA: Diagnosis not present

## 2018-04-15 DIAGNOSIS — E039 Hypothyroidism, unspecified: Secondary | ICD-10-CM | POA: Diagnosis not present

## 2018-04-15 DIAGNOSIS — Z7984 Long term (current) use of oral hypoglycemic drugs: Secondary | ICD-10-CM | POA: Diagnosis not present

## 2018-04-15 DIAGNOSIS — I130 Hypertensive heart and chronic kidney disease with heart failure and stage 1 through stage 4 chronic kidney disease, or unspecified chronic kidney disease: Secondary | ICD-10-CM | POA: Diagnosis not present

## 2018-04-15 DIAGNOSIS — N183 Chronic kidney disease, stage 3 (moderate): Secondary | ICD-10-CM | POA: Diagnosis not present

## 2018-04-15 DIAGNOSIS — G20 Parkinson's disease: Secondary | ICD-10-CM | POA: Diagnosis not present

## 2018-04-15 DIAGNOSIS — E11621 Type 2 diabetes mellitus with foot ulcer: Secondary | ICD-10-CM | POA: Diagnosis not present

## 2018-04-15 NOTE — Telephone Encounter (Signed)
I called and spoke to [name] [name] to ask that she call [facility] to give me authorization to pay [name] [name] copay due on Midodrine prescription.  She will call this afternoon and was very thankful as she tells me they do not have the resources financially to buy this medication.  She also says that they were not aware of the cost until after it was delivered.  I will attempt to call back to [facility] tomorrow to pay bill through HF patient assistance fund.

## 2018-04-16 ENCOUNTER — Other Ambulatory Visit: Payer: Self-pay | Admitting: Cardiology

## 2018-04-16 ENCOUNTER — Telehealth (HOSPITAL_COMMUNITY): Payer: Self-pay | Admitting: Surgery

## 2018-04-16 ENCOUNTER — Telehealth (HOSPITAL_COMMUNITY): Payer: Self-pay | Admitting: Licensed Clinical Social Worker

## 2018-04-16 DIAGNOSIS — I4819 Other persistent atrial fibrillation: Secondary | ICD-10-CM | POA: Diagnosis not present

## 2018-04-16 DIAGNOSIS — Z95810 Presence of automatic (implantable) cardiac defibrillator: Secondary | ICD-10-CM | POA: Diagnosis not present

## 2018-04-16 DIAGNOSIS — E039 Hypothyroidism, unspecified: Secondary | ICD-10-CM | POA: Diagnosis not present

## 2018-04-16 DIAGNOSIS — I130 Hypertensive heart and chronic kidney disease with heart failure and stage 1 through stage 4 chronic kidney disease, or unspecified chronic kidney disease: Secondary | ICD-10-CM | POA: Diagnosis not present

## 2018-04-16 DIAGNOSIS — G20 Parkinson's disease: Secondary | ICD-10-CM | POA: Diagnosis not present

## 2018-04-16 DIAGNOSIS — N183 Chronic kidney disease, stage 3 (moderate): Secondary | ICD-10-CM | POA: Diagnosis not present

## 2018-04-16 DIAGNOSIS — M48061 Spinal stenosis, lumbar region without neurogenic claudication: Secondary | ICD-10-CM | POA: Diagnosis not present

## 2018-04-16 DIAGNOSIS — D631 Anemia in chronic kidney disease: Secondary | ICD-10-CM | POA: Diagnosis not present

## 2018-04-16 DIAGNOSIS — Z7984 Long term (current) use of oral hypoglycemic drugs: Secondary | ICD-10-CM | POA: Diagnosis not present

## 2018-04-16 DIAGNOSIS — Z7901 Long term (current) use of anticoagulants: Secondary | ICD-10-CM | POA: Diagnosis not present

## 2018-04-16 DIAGNOSIS — E11621 Type 2 diabetes mellitus with foot ulcer: Secondary | ICD-10-CM | POA: Diagnosis not present

## 2018-04-16 DIAGNOSIS — E1122 Type 2 diabetes mellitus with diabetic chronic kidney disease: Secondary | ICD-10-CM | POA: Diagnosis not present

## 2018-04-16 DIAGNOSIS — I429 Cardiomyopathy, unspecified: Secondary | ICD-10-CM | POA: Diagnosis not present

## 2018-04-16 DIAGNOSIS — I5023 Acute on chronic systolic (congestive) heart failure: Secondary | ICD-10-CM | POA: Diagnosis not present

## 2018-04-16 DIAGNOSIS — Z9181 History of falling: Secondary | ICD-10-CM | POA: Diagnosis not present

## 2018-04-16 DIAGNOSIS — I2510 Atherosclerotic heart disease of native coronary artery without angina pectoris: Secondary | ICD-10-CM | POA: Diagnosis not present

## 2018-04-16 DIAGNOSIS — M170 Bilateral primary osteoarthritis of knee: Secondary | ICD-10-CM | POA: Diagnosis not present

## 2018-04-16 DIAGNOSIS — L97511 Non-pressure chronic ulcer of other part of right foot limited to breakdown of skin: Secondary | ICD-10-CM | POA: Diagnosis not present

## 2018-04-16 DIAGNOSIS — G4733 Obstructive sleep apnea (adult) (pediatric): Secondary | ICD-10-CM | POA: Diagnosis not present

## 2018-04-16 DIAGNOSIS — M1612 Unilateral primary osteoarthritis, left hip: Secondary | ICD-10-CM | POA: Diagnosis not present

## 2018-04-16 NOTE — Telephone Encounter (Signed)
CSW called to discuss possible assistance options for patient medication costs.  CSW spoke with pt wife to discuss.  Wife reports they are very concerned with current medication costs and that between the two of them it takes a large portion of their income.  Pt wife reports that she has already discussed getting assistance with medicare premiums and they were over income limit.  CSW provided pt wife with number for [facility] so she can call and discuss alternative medicare part D plans that might cover prescription costs more affectively- pt wife to follow up and has already made appointments with two insurance representatives to discuss plans.  CSW will continue to follow in clinic and assist as needed  Burna Sis, LCSW Clinical Social Worker Advanced [facility] Failure Clinic [phone]

## 2018-04-16 NOTE — Telephone Encounter (Signed)
I called and was able to pay [facility] balance due for Midodrine.  Elizabeth Palau Pharm D and I discussed that medication will be filled differently in the future.

## 2018-04-17 DIAGNOSIS — G20 Parkinson's disease: Secondary | ICD-10-CM | POA: Diagnosis not present

## 2018-04-17 DIAGNOSIS — Z7984 Long term (current) use of oral hypoglycemic drugs: Secondary | ICD-10-CM | POA: Diagnosis not present

## 2018-04-17 DIAGNOSIS — I429 Cardiomyopathy, unspecified: Secondary | ICD-10-CM | POA: Diagnosis not present

## 2018-04-17 DIAGNOSIS — D631 Anemia in chronic kidney disease: Secondary | ICD-10-CM | POA: Diagnosis not present

## 2018-04-17 DIAGNOSIS — L97511 Non-pressure chronic ulcer of other part of right foot limited to breakdown of skin: Secondary | ICD-10-CM | POA: Diagnosis not present

## 2018-04-17 DIAGNOSIS — I130 Hypertensive heart and chronic kidney disease with heart failure and stage 1 through stage 4 chronic kidney disease, or unspecified chronic kidney disease: Secondary | ICD-10-CM | POA: Diagnosis not present

## 2018-04-17 DIAGNOSIS — I4819 Other persistent atrial fibrillation: Secondary | ICD-10-CM | POA: Diagnosis not present

## 2018-04-17 DIAGNOSIS — Z95810 Presence of automatic (implantable) cardiac defibrillator: Secondary | ICD-10-CM | POA: Diagnosis not present

## 2018-04-17 DIAGNOSIS — E039 Hypothyroidism, unspecified: Secondary | ICD-10-CM | POA: Diagnosis not present

## 2018-04-17 DIAGNOSIS — M1612 Unilateral primary osteoarthritis, left hip: Secondary | ICD-10-CM | POA: Diagnosis not present

## 2018-04-17 DIAGNOSIS — E11621 Type 2 diabetes mellitus with foot ulcer: Secondary | ICD-10-CM | POA: Diagnosis not present

## 2018-04-17 DIAGNOSIS — M170 Bilateral primary osteoarthritis of knee: Secondary | ICD-10-CM | POA: Diagnosis not present

## 2018-04-17 DIAGNOSIS — E1122 Type 2 diabetes mellitus with diabetic chronic kidney disease: Secondary | ICD-10-CM | POA: Diagnosis not present

## 2018-04-17 DIAGNOSIS — I2510 Atherosclerotic heart disease of native coronary artery without angina pectoris: Secondary | ICD-10-CM | POA: Diagnosis not present

## 2018-04-17 DIAGNOSIS — G4733 Obstructive sleep apnea (adult) (pediatric): Secondary | ICD-10-CM | POA: Diagnosis not present

## 2018-04-17 DIAGNOSIS — Z7901 Long term (current) use of anticoagulants: Secondary | ICD-10-CM | POA: Diagnosis not present

## 2018-04-17 DIAGNOSIS — Z9181 History of falling: Secondary | ICD-10-CM | POA: Diagnosis not present

## 2018-04-17 DIAGNOSIS — I5023 Acute on chronic systolic (congestive) heart failure: Secondary | ICD-10-CM | POA: Diagnosis not present

## 2018-04-17 DIAGNOSIS — N183 Chronic kidney disease, stage 3 (moderate): Secondary | ICD-10-CM | POA: Diagnosis not present

## 2018-04-17 DIAGNOSIS — M48061 Spinal stenosis, lumbar region without neurogenic claudication: Secondary | ICD-10-CM | POA: Diagnosis not present

## 2018-04-18 DIAGNOSIS — I429 Cardiomyopathy, unspecified: Secondary | ICD-10-CM | POA: Diagnosis not present

## 2018-04-18 DIAGNOSIS — N183 Chronic kidney disease, stage 3 (moderate): Secondary | ICD-10-CM | POA: Diagnosis not present

## 2018-04-18 DIAGNOSIS — I5023 Acute on chronic systolic (congestive) heart failure: Secondary | ICD-10-CM | POA: Diagnosis not present

## 2018-04-18 DIAGNOSIS — E1122 Type 2 diabetes mellitus with diabetic chronic kidney disease: Secondary | ICD-10-CM | POA: Diagnosis not present

## 2018-04-18 DIAGNOSIS — I130 Hypertensive heart and chronic kidney disease with heart failure and stage 1 through stage 4 chronic kidney disease, or unspecified chronic kidney disease: Secondary | ICD-10-CM | POA: Diagnosis not present

## 2018-04-18 DIAGNOSIS — M170 Bilateral primary osteoarthritis of knee: Secondary | ICD-10-CM | POA: Diagnosis not present

## 2018-04-18 DIAGNOSIS — Z7901 Long term (current) use of anticoagulants: Secondary | ICD-10-CM | POA: Diagnosis not present

## 2018-04-18 DIAGNOSIS — G20 Parkinson's disease: Secondary | ICD-10-CM | POA: Diagnosis not present

## 2018-04-18 DIAGNOSIS — M48061 Spinal stenosis, lumbar region without neurogenic claudication: Secondary | ICD-10-CM | POA: Diagnosis not present

## 2018-04-18 DIAGNOSIS — Z7984 Long term (current) use of oral hypoglycemic drugs: Secondary | ICD-10-CM | POA: Diagnosis not present

## 2018-04-18 DIAGNOSIS — I4819 Other persistent atrial fibrillation: Secondary | ICD-10-CM | POA: Diagnosis not present

## 2018-04-18 DIAGNOSIS — E039 Hypothyroidism, unspecified: Secondary | ICD-10-CM | POA: Diagnosis not present

## 2018-04-18 DIAGNOSIS — E11621 Type 2 diabetes mellitus with foot ulcer: Secondary | ICD-10-CM | POA: Diagnosis not present

## 2018-04-18 DIAGNOSIS — G4733 Obstructive sleep apnea (adult) (pediatric): Secondary | ICD-10-CM | POA: Diagnosis not present

## 2018-04-18 DIAGNOSIS — M1612 Unilateral primary osteoarthritis, left hip: Secondary | ICD-10-CM | POA: Diagnosis not present

## 2018-04-18 DIAGNOSIS — Z95810 Presence of automatic (implantable) cardiac defibrillator: Secondary | ICD-10-CM | POA: Diagnosis not present

## 2018-04-18 DIAGNOSIS — D631 Anemia in chronic kidney disease: Secondary | ICD-10-CM | POA: Diagnosis not present

## 2018-04-18 DIAGNOSIS — Z9181 History of falling: Secondary | ICD-10-CM | POA: Diagnosis not present

## 2018-04-18 DIAGNOSIS — L97511 Non-pressure chronic ulcer of other part of right foot limited to breakdown of skin: Secondary | ICD-10-CM | POA: Diagnosis not present

## 2018-04-18 DIAGNOSIS — I2510 Atherosclerotic heart disease of native coronary artery without angina pectoris: Secondary | ICD-10-CM | POA: Diagnosis not present

## 2018-04-21 DIAGNOSIS — Z7984 Long term (current) use of oral hypoglycemic drugs: Secondary | ICD-10-CM | POA: Diagnosis not present

## 2018-04-21 DIAGNOSIS — D631 Anemia in chronic kidney disease: Secondary | ICD-10-CM | POA: Diagnosis not present

## 2018-04-21 DIAGNOSIS — I429 Cardiomyopathy, unspecified: Secondary | ICD-10-CM | POA: Diagnosis not present

## 2018-04-21 DIAGNOSIS — I5023 Acute on chronic systolic (congestive) heart failure: Secondary | ICD-10-CM | POA: Diagnosis not present

## 2018-04-21 DIAGNOSIS — M48061 Spinal stenosis, lumbar region without neurogenic claudication: Secondary | ICD-10-CM | POA: Diagnosis not present

## 2018-04-21 DIAGNOSIS — E039 Hypothyroidism, unspecified: Secondary | ICD-10-CM | POA: Diagnosis not present

## 2018-04-21 DIAGNOSIS — M1612 Unilateral primary osteoarthritis, left hip: Secondary | ICD-10-CM | POA: Diagnosis not present

## 2018-04-21 DIAGNOSIS — I130 Hypertensive heart and chronic kidney disease with heart failure and stage 1 through stage 4 chronic kidney disease, or unspecified chronic kidney disease: Secondary | ICD-10-CM | POA: Diagnosis not present

## 2018-04-21 DIAGNOSIS — Z9181 History of falling: Secondary | ICD-10-CM | POA: Diagnosis not present

## 2018-04-21 DIAGNOSIS — L97511 Non-pressure chronic ulcer of other part of right foot limited to breakdown of skin: Secondary | ICD-10-CM | POA: Diagnosis not present

## 2018-04-21 DIAGNOSIS — G4733 Obstructive sleep apnea (adult) (pediatric): Secondary | ICD-10-CM | POA: Diagnosis not present

## 2018-04-21 DIAGNOSIS — I2510 Atherosclerotic heart disease of native coronary artery without angina pectoris: Secondary | ICD-10-CM | POA: Diagnosis not present

## 2018-04-21 DIAGNOSIS — Z95810 Presence of automatic (implantable) cardiac defibrillator: Secondary | ICD-10-CM | POA: Diagnosis not present

## 2018-04-21 DIAGNOSIS — E11621 Type 2 diabetes mellitus with foot ulcer: Secondary | ICD-10-CM | POA: Diagnosis not present

## 2018-04-21 DIAGNOSIS — Z7901 Long term (current) use of anticoagulants: Secondary | ICD-10-CM | POA: Diagnosis not present

## 2018-04-21 DIAGNOSIS — G20 Parkinson's disease: Secondary | ICD-10-CM | POA: Diagnosis not present

## 2018-04-21 DIAGNOSIS — N183 Chronic kidney disease, stage 3 (moderate): Secondary | ICD-10-CM | POA: Diagnosis not present

## 2018-04-21 DIAGNOSIS — I4819 Other persistent atrial fibrillation: Secondary | ICD-10-CM | POA: Diagnosis not present

## 2018-04-21 DIAGNOSIS — E1122 Type 2 diabetes mellitus with diabetic chronic kidney disease: Secondary | ICD-10-CM | POA: Diagnosis not present

## 2018-04-21 DIAGNOSIS — M170 Bilateral primary osteoarthritis of knee: Secondary | ICD-10-CM | POA: Diagnosis not present

## 2018-04-22 ENCOUNTER — Ambulatory Visit (INDEPENDENT_AMBULATORY_CARE_PROVIDER_SITE_OTHER): Payer: Medicare Other | Admitting: Family Medicine

## 2018-04-22 ENCOUNTER — Encounter: Payer: Self-pay | Admitting: Family Medicine

## 2018-04-22 VITALS — BP 107/69 | HR 82 | Wt 193.0 lb

## 2018-04-22 DIAGNOSIS — G4733 Obstructive sleep apnea (adult) (pediatric): Secondary | ICD-10-CM | POA: Diagnosis not present

## 2018-04-22 DIAGNOSIS — I428 Other cardiomyopathies: Secondary | ICD-10-CM

## 2018-04-22 DIAGNOSIS — I25708 Atherosclerosis of coronary artery bypass graft(s), unspecified, with other forms of angina pectoris: Secondary | ICD-10-CM

## 2018-04-22 DIAGNOSIS — N183 Chronic kidney disease, stage 3 unspecified: Secondary | ICD-10-CM

## 2018-04-22 DIAGNOSIS — I429 Cardiomyopathy, unspecified: Secondary | ICD-10-CM | POA: Diagnosis not present

## 2018-04-22 DIAGNOSIS — R195 Other fecal abnormalities: Secondary | ICD-10-CM

## 2018-04-22 DIAGNOSIS — Z7901 Long term (current) use of anticoagulants: Secondary | ICD-10-CM | POA: Diagnosis not present

## 2018-04-22 DIAGNOSIS — M6281 Muscle weakness (generalized): Secondary | ICD-10-CM

## 2018-04-22 DIAGNOSIS — M48061 Spinal stenosis, lumbar region without neurogenic claudication: Secondary | ICD-10-CM | POA: Diagnosis not present

## 2018-04-22 DIAGNOSIS — E1122 Type 2 diabetes mellitus with diabetic chronic kidney disease: Secondary | ICD-10-CM

## 2018-04-22 DIAGNOSIS — M1612 Unilateral primary osteoarthritis, left hip: Secondary | ICD-10-CM | POA: Diagnosis not present

## 2018-04-22 DIAGNOSIS — R159 Full incontinence of feces: Secondary | ICD-10-CM

## 2018-04-22 DIAGNOSIS — I5023 Acute on chronic systolic (congestive) heart failure: Secondary | ICD-10-CM | POA: Diagnosis not present

## 2018-04-22 DIAGNOSIS — I2510 Atherosclerotic heart disease of native coronary artery without angina pectoris: Secondary | ICD-10-CM | POA: Diagnosis not present

## 2018-04-22 DIAGNOSIS — I4819 Other persistent atrial fibrillation: Secondary | ICD-10-CM | POA: Diagnosis not present

## 2018-04-22 DIAGNOSIS — Z95810 Presence of automatic (implantable) cardiac defibrillator: Secondary | ICD-10-CM | POA: Diagnosis not present

## 2018-04-22 DIAGNOSIS — Z5181 Encounter for therapeutic drug level monitoring: Secondary | ICD-10-CM | POA: Diagnosis not present

## 2018-04-22 DIAGNOSIS — L97511 Non-pressure chronic ulcer of other part of right foot limited to breakdown of skin: Secondary | ICD-10-CM | POA: Diagnosis not present

## 2018-04-22 DIAGNOSIS — Z7984 Long term (current) use of oral hypoglycemic drugs: Secondary | ICD-10-CM | POA: Diagnosis not present

## 2018-04-22 DIAGNOSIS — E039 Hypothyroidism, unspecified: Secondary | ICD-10-CM

## 2018-04-22 DIAGNOSIS — E11621 Type 2 diabetes mellitus with foot ulcer: Secondary | ICD-10-CM | POA: Diagnosis not present

## 2018-04-22 DIAGNOSIS — G20 Parkinson's disease: Secondary | ICD-10-CM | POA: Diagnosis not present

## 2018-04-22 DIAGNOSIS — M170 Bilateral primary osteoarthritis of knee: Secondary | ICD-10-CM | POA: Diagnosis not present

## 2018-04-22 DIAGNOSIS — Z9181 History of falling: Secondary | ICD-10-CM | POA: Diagnosis not present

## 2018-04-22 DIAGNOSIS — I130 Hypertensive heart and chronic kidney disease with heart failure and stage 1 through stage 4 chronic kidney disease, or unspecified chronic kidney disease: Secondary | ICD-10-CM | POA: Diagnosis not present

## 2018-04-22 DIAGNOSIS — R0609 Other forms of dyspnea: Secondary | ICD-10-CM

## 2018-04-22 DIAGNOSIS — D631 Anemia in chronic kidney disease: Secondary | ICD-10-CM | POA: Diagnosis not present

## 2018-04-22 NOTE — Progress Notes (Signed)
[name] [name] is a 84 y.o. male who presents to [place]: [place] today for follow-up fatigue cardiac symptoms, discuss new fecal incontinence and diarrhea, follow-up diabetes.  His cardiologist is also in the process of working him up for possible amyloidosis.  This could explain his chronic global hypokinesis heart failure as well as the neurology symptoms he is having.  He had imaging test that was initially negative.  SPEP was ordered but not completed.   Aceson has had significant difficulty with exertional fatigue thought to be related to his heart failure as well as possibly his Parkinson's disease.  He has been seen by his cardiology team recently and was recently started on midodrine.  He notes this is helped a bit and he is able to exert himself a little more.  For example he is able to walk a lot more than he used to be able to.  He feels reasonably well with no new chest pain palpitations or new worsening shortness of breath.  He does note a new several week symptom of occasional loose stools diarrhea and occasional fecal incontinence.  He finds this obnoxious.  He is tried some over-the-counter Kaopectate which is helped a bit.  He continues to take metformin for diabetes 500 mg twice daily for a few months now.  He is pretty sure he did not have GI symptoms when the medication was initially started.  Diabetes he notes is doing quite well.  He notes his blood sugars are typically in the low 100s fasting and typically not much higher than 150s with meals.  He denies any hypoglycemic episodes or hyperglycemic episodes.   Hypothyroidism: Kedarius takes levothyroxine daily no significant changes.  Fatigue as above no skin hair or nail changes.  ROS as above:   Exam:  BP 107/69   Pulse 82   Wt 193 lb (87.5 kg)   BMI 23.49 kg/m  107/69 Wt Readings from Last 5 Encounters:    04/22/18 193 lb (87.5 kg)  04/07/18 192 lb 12.8 oz (87.5 kg)  03/27/18 195 lb (88.5 kg)  03/26/18 195 lb (88.5 kg)  02/26/18 193 lb (87.5 kg)    Gen: Well NAD HEENT: EOMI,  MMM Lungs: Normal work of breathing. CTABL Heart: Heart rate around 60 bpm.  Slight irregularity.  No MRG Abd: NABS, Soft. Nondistended, Nontender Exts: Brisk capillary refill, warm and well perfused.   Lab and Radiology Results No results found for this or any previous visit (from the past 72 hour(s)). No results found.    Assessment and Plan: 84 y.o. male with  Cardiac problems including heart failure and atrial fibrillation: Doing reasonably well.  Improvement with Midodrine.  Continue current regimen.  Will complete amyloidosis work-up with SPEP.  Patient has no other signs or symptoms on previous laboratory tests and imaging tests suggest multiple myeloma however it is reasonable to complete the simple lab work-up.  We will also check TSH to follow-up hypothyroidism and recheck INR.  Loose stools fecal incontinence: Unclear etiology.  Possible metformin is a component here plan to hold metformin for 2 weeks then restart for 2 weeks and see if symptoms change.  Recommend using Imodium instead of Kaopectate as this will interfere with absorption of medicines less.  Diabetes doing reasonably well.  Holding metformin for 2 weeks as above.  Will adjust sulfonylurea dose as needed.  Check 2 weeks.   Orders Placed This Encounter  Procedures  .  Protein Electrophoresis, (serum)  . INR/PT  . TSH   No orders of the defined types were placed in this encounter.    Historical information moved to improve visibility of documentation.  Past Medical History:  Diagnosis Date  . Anemia, unspecified 04/07/2013  . Atrial fibrillation (HCC)    fibrillation/flutter  . BPH (benign prostatic hypertrophy)   . Chronic systolic CHF (congestive heart failure) (HCC)    Nonischemic DCM EF 10% s/p BiV AICD  . Coronary  artery disease 2008   nonobstructive ASCAD  . Depression   . Hyperlipidemia   . Hypertension   . Hypothyroidism   . Left bundle branch block   . Nonischemic cardiomyopathy (HCC)    ef 10 %  . OSA (obstructive sleep apnea)    intolerant to CPAP  . Osteoarthritis   . Sleep apnea    stopped using CPAP  . Thrombocytopenia ([place])    Past Surgical History:  Procedure Laterality Date  . CARDIAC DEFIBRILLATOR PLACEMENT     left chest  . CARDIOVERSION N/A 12/29/2012   Procedure: CARDIOVERSION;  Surgeon: Sueanne Margarita, MD;  Location: [facility];  Service: Cardiovascular;  Laterality: N/A;  . CHOLECYSTECTOMY    . Colonscopy     reportedly negative per pt; around 2005.    . EP IMPLANTABLE DEVICE N/A 05/09/2016   Procedure: [place];  Surgeon: Deboraha Sprang, MD;  Location: [facility];  Service: Cardiovascular;  Laterality: N/A;  . TRANSURETHRAL RESECTION OF PROSTATE     for BPH   Social History   Tobacco Use  . Smoking status: Never Smoker  . Smokeless tobacco: Never Used  Substance Use Topics  . Alcohol use: No   family history includes Cancer in his maternal uncle; Heart disease in his father; Heart failure in his mother; Stroke in his mother.  Medications: Current Outpatient Medications  Medication Sig Dispense Refill  . acetaminophen (TYLENOL) 650 MG CR tablet Take 1 tablet (650 mg total) by mouth every 8 (eight) hours as needed for pain. 90 tablet 3  . AMBULATORY NON FORMULARY MEDICATION Lancets pen type sufficient for three times daily checking. Dispense 3 month supply E11.65 1 each 12  . AMBULATORY NON FORMULARY MEDICATION Test strips for One Touch Verio Flex meter.  Test 3x daily.  Disp 3 month supply.  E11.65 1 each 12  . AMBULATORY NON FORMULARY MEDICATION order to [facility] for CNA to come into home and help give baths.order for social work to come into home to help with services. [facility] number is [phone] 1 each 0  .  carbidopa-levodopa (SINEMET) 25-100 MG tablet Take 1 tablet by mouth 3 (three) times daily.    . diclofenac sodium (VOLTAREN) 1 % GEL Apply 4 g topically 4 (four) times daily. To affected joint. 1440 g 3  . digoxin (LANOXIN) 0.125 MG tablet TAKE 1 TABLET BY MOUTH  EVERY OTHER DAY 45 tablet 3  . glimepiride (AMARYL) 1 MG tablet Take 1 tablet (1 mg total) by mouth daily before breakfast. 90 tablet 1  . levothyroxine (SYNTHROID, LEVOTHROID) 137 MCG tablet TAKE 1 TABLET BY MOUTH  DAILY BEFORE BREAKFAST 90 tablet 1  . metFORMIN (GLUCOPHAGE) 500 MG tablet Take 1 tablet (500 mg total) by mouth 2 (two) times daily with a meal. 180 tablet 1  . midodrine (PROAMATINE) 2.5 MG tablet Take 2.5 mg by mouth 3 (three) times daily with meals.    . mupirocin ointment (BACTROBAN) 2 % Apply to  affected area BID for 7 days. 30 g 3  . nystatin cream (MYCOSTATIN) Apply 1 application topically 2 (two) times daily. 60 g 2  . Nystatin POWD Apply liberally to affected area 2 times per day 1 Bottle 11  . potassium chloride SA (K-DUR,KLOR-CON) 20 MEQ tablet Take 1 tablet (20 mEq total) by mouth daily. 90 tablet 3  . simvastatin (ZOCOR) 10 MG tablet TAKE 1 TABLET BY MOUTH  EVERY EVENING 90 tablet 3  . torsemide (DEMADEX) 20 MG tablet Take 1 tablet (20 mg total) by mouth daily. 30 tablet 0  . warfarin (COUMADIN) 1 MG tablet Take 1 mg pill on Monday, Wednesday, Thursday and Friday in addition to 70m dose 30 tablet 2  . warfarin (COUMADIN) 5 MG tablet TAKE 1 TABLET BY MOUTH  EVERY DAY 90 tablet 3   No current facility-administered medications for this visit.    Allergies  Allergen Reactions  . Amiodarone Other (See Comments)    Neuro toxicity     Discussed warning signs or symptoms. Please see discharge instructions. Patient expresses understanding.

## 2018-04-22 NOTE — Patient Instructions (Addendum)
Thank you for coming in today. We will stop the metformin for 2 weeks to see if the stools improve. Restart after 2 weeks and see if the diarrhea returns.   Recheck in 1 month.  Return sooner if needed.   Ok to take imodium for diarrhea as needed.

## 2018-04-23 ENCOUNTER — Telehealth: Payer: Self-pay

## 2018-04-23 DIAGNOSIS — G4733 Obstructive sleep apnea (adult) (pediatric): Secondary | ICD-10-CM | POA: Diagnosis not present

## 2018-04-23 DIAGNOSIS — M1612 Unilateral primary osteoarthritis, left hip: Secondary | ICD-10-CM | POA: Diagnosis not present

## 2018-04-23 DIAGNOSIS — D631 Anemia in chronic kidney disease: Secondary | ICD-10-CM | POA: Diagnosis not present

## 2018-04-23 DIAGNOSIS — E1122 Type 2 diabetes mellitus with diabetic chronic kidney disease: Secondary | ICD-10-CM | POA: Diagnosis not present

## 2018-04-23 DIAGNOSIS — N183 Chronic kidney disease, stage 3 (moderate): Secondary | ICD-10-CM | POA: Diagnosis not present

## 2018-04-23 DIAGNOSIS — Z7901 Long term (current) use of anticoagulants: Secondary | ICD-10-CM | POA: Diagnosis not present

## 2018-04-23 DIAGNOSIS — Z95810 Presence of automatic (implantable) cardiac defibrillator: Secondary | ICD-10-CM | POA: Diagnosis not present

## 2018-04-23 DIAGNOSIS — E039 Hypothyroidism, unspecified: Secondary | ICD-10-CM | POA: Diagnosis not present

## 2018-04-23 DIAGNOSIS — E11621 Type 2 diabetes mellitus with foot ulcer: Secondary | ICD-10-CM | POA: Diagnosis not present

## 2018-04-23 DIAGNOSIS — I5023 Acute on chronic systolic (congestive) heart failure: Secondary | ICD-10-CM | POA: Diagnosis not present

## 2018-04-23 DIAGNOSIS — G20 Parkinson's disease: Secondary | ICD-10-CM | POA: Diagnosis not present

## 2018-04-23 DIAGNOSIS — I4819 Other persistent atrial fibrillation: Secondary | ICD-10-CM | POA: Diagnosis not present

## 2018-04-23 DIAGNOSIS — L97511 Non-pressure chronic ulcer of other part of right foot limited to breakdown of skin: Secondary | ICD-10-CM | POA: Diagnosis not present

## 2018-04-23 DIAGNOSIS — I130 Hypertensive heart and chronic kidney disease with heart failure and stage 1 through stage 4 chronic kidney disease, or unspecified chronic kidney disease: Secondary | ICD-10-CM | POA: Diagnosis not present

## 2018-04-23 DIAGNOSIS — I2510 Atherosclerotic heart disease of native coronary artery without angina pectoris: Secondary | ICD-10-CM | POA: Diagnosis not present

## 2018-04-23 DIAGNOSIS — M48061 Spinal stenosis, lumbar region without neurogenic claudication: Secondary | ICD-10-CM | POA: Diagnosis not present

## 2018-04-23 DIAGNOSIS — Z7984 Long term (current) use of oral hypoglycemic drugs: Secondary | ICD-10-CM | POA: Diagnosis not present

## 2018-04-23 DIAGNOSIS — I429 Cardiomyopathy, unspecified: Secondary | ICD-10-CM | POA: Diagnosis not present

## 2018-04-23 DIAGNOSIS — M170 Bilateral primary osteoarthritis of knee: Secondary | ICD-10-CM | POA: Diagnosis not present

## 2018-04-23 DIAGNOSIS — Z9181 History of falling: Secondary | ICD-10-CM | POA: Diagnosis not present

## 2018-04-23 MED ORDER — NYSTATIN 100000 UNIT/GM EX CREA: 1.0000 "application " | TOPICAL_CREAM | Freq: Two times a day (BID) | CUTANEOUS | 2 refills | Status: DC

## 2018-04-23 NOTE — Telephone Encounter (Signed)
Patient request refill for Nystatin cream sent [facility]. Saran Laviolette,CMA

## 2018-04-24 DIAGNOSIS — Z7901 Long term (current) use of anticoagulants: Secondary | ICD-10-CM | POA: Diagnosis not present

## 2018-04-24 DIAGNOSIS — D631 Anemia in chronic kidney disease: Secondary | ICD-10-CM | POA: Diagnosis not present

## 2018-04-24 DIAGNOSIS — G20 Parkinson's disease: Secondary | ICD-10-CM | POA: Diagnosis not present

## 2018-04-24 DIAGNOSIS — E1122 Type 2 diabetes mellitus with diabetic chronic kidney disease: Secondary | ICD-10-CM | POA: Diagnosis not present

## 2018-04-24 DIAGNOSIS — N183 Chronic kidney disease, stage 3 (moderate): Secondary | ICD-10-CM | POA: Diagnosis not present

## 2018-04-24 DIAGNOSIS — I130 Hypertensive heart and chronic kidney disease with heart failure and stage 1 through stage 4 chronic kidney disease, or unspecified chronic kidney disease: Secondary | ICD-10-CM | POA: Diagnosis not present

## 2018-04-24 DIAGNOSIS — G4733 Obstructive sleep apnea (adult) (pediatric): Secondary | ICD-10-CM | POA: Diagnosis not present

## 2018-04-24 DIAGNOSIS — L97511 Non-pressure chronic ulcer of other part of right foot limited to breakdown of skin: Secondary | ICD-10-CM | POA: Diagnosis not present

## 2018-04-24 DIAGNOSIS — Z9181 History of falling: Secondary | ICD-10-CM | POA: Diagnosis not present

## 2018-04-24 DIAGNOSIS — M170 Bilateral primary osteoarthritis of knee: Secondary | ICD-10-CM | POA: Diagnosis not present

## 2018-04-24 DIAGNOSIS — M48061 Spinal stenosis, lumbar region without neurogenic claudication: Secondary | ICD-10-CM | POA: Diagnosis not present

## 2018-04-24 DIAGNOSIS — I5023 Acute on chronic systolic (congestive) heart failure: Secondary | ICD-10-CM | POA: Diagnosis not present

## 2018-04-24 DIAGNOSIS — I2510 Atherosclerotic heart disease of native coronary artery without angina pectoris: Secondary | ICD-10-CM | POA: Diagnosis not present

## 2018-04-24 DIAGNOSIS — I429 Cardiomyopathy, unspecified: Secondary | ICD-10-CM | POA: Diagnosis not present

## 2018-04-24 DIAGNOSIS — Z7984 Long term (current) use of oral hypoglycemic drugs: Secondary | ICD-10-CM | POA: Diagnosis not present

## 2018-04-24 DIAGNOSIS — I4819 Other persistent atrial fibrillation: Secondary | ICD-10-CM | POA: Diagnosis not present

## 2018-04-24 DIAGNOSIS — Z95810 Presence of automatic (implantable) cardiac defibrillator: Secondary | ICD-10-CM | POA: Diagnosis not present

## 2018-04-24 DIAGNOSIS — M1612 Unilateral primary osteoarthritis, left hip: Secondary | ICD-10-CM | POA: Diagnosis not present

## 2018-04-24 DIAGNOSIS — E11621 Type 2 diabetes mellitus with foot ulcer: Secondary | ICD-10-CM | POA: Diagnosis not present

## 2018-04-24 DIAGNOSIS — E039 Hypothyroidism, unspecified: Secondary | ICD-10-CM | POA: Diagnosis not present

## 2018-04-25 DIAGNOSIS — I5023 Acute on chronic systolic (congestive) heart failure: Secondary | ICD-10-CM | POA: Diagnosis not present

## 2018-04-25 DIAGNOSIS — M1612 Unilateral primary osteoarthritis, left hip: Secondary | ICD-10-CM | POA: Diagnosis not present

## 2018-04-25 DIAGNOSIS — E11621 Type 2 diabetes mellitus with foot ulcer: Secondary | ICD-10-CM | POA: Diagnosis not present

## 2018-04-25 DIAGNOSIS — I4819 Other persistent atrial fibrillation: Secondary | ICD-10-CM | POA: Diagnosis not present

## 2018-04-25 DIAGNOSIS — G20 Parkinson's disease: Secondary | ICD-10-CM | POA: Diagnosis not present

## 2018-04-25 DIAGNOSIS — G4733 Obstructive sleep apnea (adult) (pediatric): Secondary | ICD-10-CM | POA: Diagnosis not present

## 2018-04-25 DIAGNOSIS — E1122 Type 2 diabetes mellitus with diabetic chronic kidney disease: Secondary | ICD-10-CM | POA: Diagnosis not present

## 2018-04-25 DIAGNOSIS — E039 Hypothyroidism, unspecified: Secondary | ICD-10-CM | POA: Diagnosis not present

## 2018-04-25 DIAGNOSIS — I429 Cardiomyopathy, unspecified: Secondary | ICD-10-CM | POA: Diagnosis not present

## 2018-04-25 DIAGNOSIS — I130 Hypertensive heart and chronic kidney disease with heart failure and stage 1 through stage 4 chronic kidney disease, or unspecified chronic kidney disease: Secondary | ICD-10-CM | POA: Diagnosis not present

## 2018-04-25 DIAGNOSIS — Z9181 History of falling: Secondary | ICD-10-CM | POA: Diagnosis not present

## 2018-04-25 DIAGNOSIS — N183 Chronic kidney disease, stage 3 (moderate): Secondary | ICD-10-CM | POA: Diagnosis not present

## 2018-04-25 DIAGNOSIS — Z95810 Presence of automatic (implantable) cardiac defibrillator: Secondary | ICD-10-CM | POA: Diagnosis not present

## 2018-04-25 DIAGNOSIS — M170 Bilateral primary osteoarthritis of knee: Secondary | ICD-10-CM | POA: Diagnosis not present

## 2018-04-25 DIAGNOSIS — I2510 Atherosclerotic heart disease of native coronary artery without angina pectoris: Secondary | ICD-10-CM | POA: Diagnosis not present

## 2018-04-25 DIAGNOSIS — D631 Anemia in chronic kidney disease: Secondary | ICD-10-CM | POA: Diagnosis not present

## 2018-04-25 DIAGNOSIS — M48061 Spinal stenosis, lumbar region without neurogenic claudication: Secondary | ICD-10-CM | POA: Diagnosis not present

## 2018-04-25 DIAGNOSIS — Z7984 Long term (current) use of oral hypoglycemic drugs: Secondary | ICD-10-CM | POA: Diagnosis not present

## 2018-04-25 DIAGNOSIS — L97511 Non-pressure chronic ulcer of other part of right foot limited to breakdown of skin: Secondary | ICD-10-CM | POA: Diagnosis not present

## 2018-04-25 DIAGNOSIS — Z7901 Long term (current) use of anticoagulants: Secondary | ICD-10-CM | POA: Diagnosis not present

## 2018-04-25 LAB — PROTEIN ELECTROPHORESIS, SERUM
ALBUMIN ELP: 4.5 g/dL (ref 3.8–4.8)
ALPHA 1: 0.3 g/dL (ref 0.2–0.3)
Alpha 2: 0.8 g/dL (ref 0.5–0.9)
BETA 2: 0.3 g/dL (ref 0.2–0.5)
BETA GLOBULIN: 0.4 g/dL (ref 0.4–0.6)
GAMMA GLOBULIN: 0.7 g/dL — AB (ref 0.8–1.7)
Total Protein: 7 g/dL (ref 6.1–8.1)

## 2018-04-25 LAB — PROTIME-INR
INR: 1.9 — AB
PROTHROMBIN TIME: 18.7 s — AB (ref 9.0–11.5)

## 2018-04-25 LAB — TSH: TSH: 0.47 mIU/L (ref 0.40–4.50)

## 2018-04-28 DIAGNOSIS — Z7984 Long term (current) use of oral hypoglycemic drugs: Secondary | ICD-10-CM | POA: Diagnosis not present

## 2018-04-28 DIAGNOSIS — Z95810 Presence of automatic (implantable) cardiac defibrillator: Secondary | ICD-10-CM | POA: Diagnosis not present

## 2018-04-28 DIAGNOSIS — D631 Anemia in chronic kidney disease: Secondary | ICD-10-CM | POA: Diagnosis not present

## 2018-04-28 DIAGNOSIS — G4733 Obstructive sleep apnea (adult) (pediatric): Secondary | ICD-10-CM | POA: Diagnosis not present

## 2018-04-28 DIAGNOSIS — I4819 Other persistent atrial fibrillation: Secondary | ICD-10-CM | POA: Diagnosis not present

## 2018-04-28 DIAGNOSIS — M170 Bilateral primary osteoarthritis of knee: Secondary | ICD-10-CM | POA: Diagnosis not present

## 2018-04-28 DIAGNOSIS — N183 Chronic kidney disease, stage 3 (moderate): Secondary | ICD-10-CM | POA: Diagnosis not present

## 2018-04-28 DIAGNOSIS — I5023 Acute on chronic systolic (congestive) heart failure: Secondary | ICD-10-CM | POA: Diagnosis not present

## 2018-04-28 DIAGNOSIS — I130 Hypertensive heart and chronic kidney disease with heart failure and stage 1 through stage 4 chronic kidney disease, or unspecified chronic kidney disease: Secondary | ICD-10-CM | POA: Diagnosis not present

## 2018-04-28 DIAGNOSIS — E11621 Type 2 diabetes mellitus with foot ulcer: Secondary | ICD-10-CM | POA: Diagnosis not present

## 2018-04-28 DIAGNOSIS — Z9181 History of falling: Secondary | ICD-10-CM | POA: Diagnosis not present

## 2018-04-28 DIAGNOSIS — M48061 Spinal stenosis, lumbar region without neurogenic claudication: Secondary | ICD-10-CM | POA: Diagnosis not present

## 2018-04-28 DIAGNOSIS — I2510 Atherosclerotic heart disease of native coronary artery without angina pectoris: Secondary | ICD-10-CM | POA: Diagnosis not present

## 2018-04-28 DIAGNOSIS — I429 Cardiomyopathy, unspecified: Secondary | ICD-10-CM | POA: Diagnosis not present

## 2018-04-28 DIAGNOSIS — M1612 Unilateral primary osteoarthritis, left hip: Secondary | ICD-10-CM | POA: Diagnosis not present

## 2018-04-28 DIAGNOSIS — E039 Hypothyroidism, unspecified: Secondary | ICD-10-CM | POA: Diagnosis not present

## 2018-04-28 DIAGNOSIS — Z7901 Long term (current) use of anticoagulants: Secondary | ICD-10-CM | POA: Diagnosis not present

## 2018-04-28 DIAGNOSIS — G20 Parkinson's disease: Secondary | ICD-10-CM | POA: Diagnosis not present

## 2018-04-28 DIAGNOSIS — L97511 Non-pressure chronic ulcer of other part of right foot limited to breakdown of skin: Secondary | ICD-10-CM | POA: Diagnosis not present

## 2018-04-28 DIAGNOSIS — E1122 Type 2 diabetes mellitus with diabetic chronic kidney disease: Secondary | ICD-10-CM | POA: Diagnosis not present

## 2018-04-29 DIAGNOSIS — E08621 Diabetes mellitus due to underlying condition with foot ulcer: Secondary | ICD-10-CM | POA: Diagnosis not present

## 2018-04-30 ENCOUNTER — Telehealth: Payer: Self-pay

## 2018-04-30 DIAGNOSIS — I2510 Atherosclerotic heart disease of native coronary artery without angina pectoris: Secondary | ICD-10-CM | POA: Diagnosis not present

## 2018-04-30 DIAGNOSIS — I429 Cardiomyopathy, unspecified: Secondary | ICD-10-CM | POA: Diagnosis not present

## 2018-04-30 DIAGNOSIS — M1612 Unilateral primary osteoarthritis, left hip: Secondary | ICD-10-CM | POA: Diagnosis not present

## 2018-04-30 DIAGNOSIS — M48061 Spinal stenosis, lumbar region without neurogenic claudication: Secondary | ICD-10-CM | POA: Diagnosis not present

## 2018-04-30 DIAGNOSIS — Z7984 Long term (current) use of oral hypoglycemic drugs: Secondary | ICD-10-CM | POA: Diagnosis not present

## 2018-04-30 DIAGNOSIS — G20 Parkinson's disease: Secondary | ICD-10-CM | POA: Diagnosis not present

## 2018-04-30 DIAGNOSIS — L97511 Non-pressure chronic ulcer of other part of right foot limited to breakdown of skin: Secondary | ICD-10-CM | POA: Diagnosis not present

## 2018-04-30 DIAGNOSIS — Z7901 Long term (current) use of anticoagulants: Secondary | ICD-10-CM | POA: Diagnosis not present

## 2018-04-30 DIAGNOSIS — N183 Chronic kidney disease, stage 3 (moderate): Secondary | ICD-10-CM | POA: Diagnosis not present

## 2018-04-30 DIAGNOSIS — E11621 Type 2 diabetes mellitus with foot ulcer: Secondary | ICD-10-CM | POA: Diagnosis not present

## 2018-04-30 DIAGNOSIS — E039 Hypothyroidism, unspecified: Secondary | ICD-10-CM | POA: Diagnosis not present

## 2018-04-30 DIAGNOSIS — I130 Hypertensive heart and chronic kidney disease with heart failure and stage 1 through stage 4 chronic kidney disease, or unspecified chronic kidney disease: Secondary | ICD-10-CM | POA: Diagnosis not present

## 2018-04-30 DIAGNOSIS — M170 Bilateral primary osteoarthritis of knee: Secondary | ICD-10-CM | POA: Diagnosis not present

## 2018-04-30 DIAGNOSIS — Z95810 Presence of automatic (implantable) cardiac defibrillator: Secondary | ICD-10-CM | POA: Diagnosis not present

## 2018-04-30 DIAGNOSIS — G4733 Obstructive sleep apnea (adult) (pediatric): Secondary | ICD-10-CM | POA: Diagnosis not present

## 2018-04-30 DIAGNOSIS — D631 Anemia in chronic kidney disease: Secondary | ICD-10-CM | POA: Diagnosis not present

## 2018-04-30 DIAGNOSIS — I4819 Other persistent atrial fibrillation: Secondary | ICD-10-CM | POA: Diagnosis not present

## 2018-04-30 DIAGNOSIS — E1122 Type 2 diabetes mellitus with diabetic chronic kidney disease: Secondary | ICD-10-CM | POA: Diagnosis not present

## 2018-04-30 DIAGNOSIS — Z9181 History of falling: Secondary | ICD-10-CM | POA: Diagnosis not present

## 2018-04-30 DIAGNOSIS — I5023 Acute on chronic systolic (congestive) heart failure: Secondary | ICD-10-CM | POA: Diagnosis not present

## 2018-04-30 NOTE — Telephone Encounter (Signed)
Tabitha from [facility] called to report new stage 2 pressure ulcer. Requesting VO to treat   Verbal orders given to treat pressure ulcer  FYI to Dr Denyse Amass

## 2018-05-01 DIAGNOSIS — Z9181 History of falling: Secondary | ICD-10-CM | POA: Diagnosis not present

## 2018-05-01 DIAGNOSIS — Z7984 Long term (current) use of oral hypoglycemic drugs: Secondary | ICD-10-CM | POA: Diagnosis not present

## 2018-05-01 DIAGNOSIS — N183 Chronic kidney disease, stage 3 (moderate): Secondary | ICD-10-CM | POA: Diagnosis not present

## 2018-05-01 DIAGNOSIS — M48061 Spinal stenosis, lumbar region without neurogenic claudication: Secondary | ICD-10-CM | POA: Diagnosis not present

## 2018-05-01 DIAGNOSIS — E1122 Type 2 diabetes mellitus with diabetic chronic kidney disease: Secondary | ICD-10-CM | POA: Diagnosis not present

## 2018-05-01 DIAGNOSIS — I5023 Acute on chronic systolic (congestive) heart failure: Secondary | ICD-10-CM | POA: Diagnosis not present

## 2018-05-01 DIAGNOSIS — D631 Anemia in chronic kidney disease: Secondary | ICD-10-CM | POA: Diagnosis not present

## 2018-05-01 DIAGNOSIS — I4819 Other persistent atrial fibrillation: Secondary | ICD-10-CM | POA: Diagnosis not present

## 2018-05-01 DIAGNOSIS — M1612 Unilateral primary osteoarthritis, left hip: Secondary | ICD-10-CM | POA: Diagnosis not present

## 2018-05-01 DIAGNOSIS — E11621 Type 2 diabetes mellitus with foot ulcer: Secondary | ICD-10-CM | POA: Diagnosis not present

## 2018-05-01 DIAGNOSIS — G20 Parkinson's disease: Secondary | ICD-10-CM | POA: Diagnosis not present

## 2018-05-01 DIAGNOSIS — E039 Hypothyroidism, unspecified: Secondary | ICD-10-CM | POA: Diagnosis not present

## 2018-05-01 DIAGNOSIS — L97511 Non-pressure chronic ulcer of other part of right foot limited to breakdown of skin: Secondary | ICD-10-CM | POA: Diagnosis not present

## 2018-05-01 DIAGNOSIS — I130 Hypertensive heart and chronic kidney disease with heart failure and stage 1 through stage 4 chronic kidney disease, or unspecified chronic kidney disease: Secondary | ICD-10-CM | POA: Diagnosis not present

## 2018-05-01 DIAGNOSIS — I2510 Atherosclerotic heart disease of native coronary artery without angina pectoris: Secondary | ICD-10-CM | POA: Diagnosis not present

## 2018-05-01 DIAGNOSIS — Z7901 Long term (current) use of anticoagulants: Secondary | ICD-10-CM | POA: Diagnosis not present

## 2018-05-01 DIAGNOSIS — G4733 Obstructive sleep apnea (adult) (pediatric): Secondary | ICD-10-CM | POA: Diagnosis not present

## 2018-05-01 DIAGNOSIS — M170 Bilateral primary osteoarthritis of knee: Secondary | ICD-10-CM | POA: Diagnosis not present

## 2018-05-01 DIAGNOSIS — I429 Cardiomyopathy, unspecified: Secondary | ICD-10-CM | POA: Diagnosis not present

## 2018-05-01 DIAGNOSIS — Z95810 Presence of automatic (implantable) cardiac defibrillator: Secondary | ICD-10-CM | POA: Diagnosis not present

## 2018-05-02 DIAGNOSIS — G20 Parkinson's disease: Secondary | ICD-10-CM | POA: Diagnosis not present

## 2018-05-02 DIAGNOSIS — M1612 Unilateral primary osteoarthritis, left hip: Secondary | ICD-10-CM | POA: Diagnosis not present

## 2018-05-02 DIAGNOSIS — I5023 Acute on chronic systolic (congestive) heart failure: Secondary | ICD-10-CM | POA: Diagnosis not present

## 2018-05-02 DIAGNOSIS — Z7984 Long term (current) use of oral hypoglycemic drugs: Secondary | ICD-10-CM | POA: Diagnosis not present

## 2018-05-02 DIAGNOSIS — I2510 Atherosclerotic heart disease of native coronary artery without angina pectoris: Secondary | ICD-10-CM | POA: Diagnosis not present

## 2018-05-02 DIAGNOSIS — G4733 Obstructive sleep apnea (adult) (pediatric): Secondary | ICD-10-CM | POA: Diagnosis not present

## 2018-05-02 DIAGNOSIS — E11621 Type 2 diabetes mellitus with foot ulcer: Secondary | ICD-10-CM | POA: Diagnosis not present

## 2018-05-02 DIAGNOSIS — Z7901 Long term (current) use of anticoagulants: Secondary | ICD-10-CM | POA: Diagnosis not present

## 2018-05-02 DIAGNOSIS — Z95810 Presence of automatic (implantable) cardiac defibrillator: Secondary | ICD-10-CM | POA: Diagnosis not present

## 2018-05-02 DIAGNOSIS — N183 Chronic kidney disease, stage 3 (moderate): Secondary | ICD-10-CM | POA: Diagnosis not present

## 2018-05-02 DIAGNOSIS — I429 Cardiomyopathy, unspecified: Secondary | ICD-10-CM | POA: Diagnosis not present

## 2018-05-02 DIAGNOSIS — L97511 Non-pressure chronic ulcer of other part of right foot limited to breakdown of skin: Secondary | ICD-10-CM | POA: Diagnosis not present

## 2018-05-02 DIAGNOSIS — E039 Hypothyroidism, unspecified: Secondary | ICD-10-CM | POA: Diagnosis not present

## 2018-05-02 DIAGNOSIS — M48061 Spinal stenosis, lumbar region without neurogenic claudication: Secondary | ICD-10-CM | POA: Diagnosis not present

## 2018-05-02 DIAGNOSIS — I130 Hypertensive heart and chronic kidney disease with heart failure and stage 1 through stage 4 chronic kidney disease, or unspecified chronic kidney disease: Secondary | ICD-10-CM | POA: Diagnosis not present

## 2018-05-02 DIAGNOSIS — E1122 Type 2 diabetes mellitus with diabetic chronic kidney disease: Secondary | ICD-10-CM | POA: Diagnosis not present

## 2018-05-02 DIAGNOSIS — M170 Bilateral primary osteoarthritis of knee: Secondary | ICD-10-CM | POA: Diagnosis not present

## 2018-05-02 DIAGNOSIS — Z9181 History of falling: Secondary | ICD-10-CM | POA: Diagnosis not present

## 2018-05-02 DIAGNOSIS — D631 Anemia in chronic kidney disease: Secondary | ICD-10-CM | POA: Diagnosis not present

## 2018-05-02 DIAGNOSIS — I4819 Other persistent atrial fibrillation: Secondary | ICD-10-CM | POA: Diagnosis not present

## 2018-05-05 DIAGNOSIS — I2510 Atherosclerotic heart disease of native coronary artery without angina pectoris: Secondary | ICD-10-CM | POA: Diagnosis not present

## 2018-05-05 DIAGNOSIS — N183 Chronic kidney disease, stage 3 (moderate): Secondary | ICD-10-CM | POA: Diagnosis not present

## 2018-05-05 DIAGNOSIS — E039 Hypothyroidism, unspecified: Secondary | ICD-10-CM | POA: Diagnosis not present

## 2018-05-05 DIAGNOSIS — L97511 Non-pressure chronic ulcer of other part of right foot limited to breakdown of skin: Secondary | ICD-10-CM | POA: Diagnosis not present

## 2018-05-05 DIAGNOSIS — Z7901 Long term (current) use of anticoagulants: Secondary | ICD-10-CM | POA: Diagnosis not present

## 2018-05-05 DIAGNOSIS — I4819 Other persistent atrial fibrillation: Secondary | ICD-10-CM | POA: Diagnosis not present

## 2018-05-05 DIAGNOSIS — Z95810 Presence of automatic (implantable) cardiac defibrillator: Secondary | ICD-10-CM | POA: Diagnosis not present

## 2018-05-05 DIAGNOSIS — G20 Parkinson's disease: Secondary | ICD-10-CM | POA: Diagnosis not present

## 2018-05-05 DIAGNOSIS — E1122 Type 2 diabetes mellitus with diabetic chronic kidney disease: Secondary | ICD-10-CM | POA: Diagnosis not present

## 2018-05-05 DIAGNOSIS — M1612 Unilateral primary osteoarthritis, left hip: Secondary | ICD-10-CM | POA: Diagnosis not present

## 2018-05-05 DIAGNOSIS — M170 Bilateral primary osteoarthritis of knee: Secondary | ICD-10-CM | POA: Diagnosis not present

## 2018-05-05 DIAGNOSIS — I5023 Acute on chronic systolic (congestive) heart failure: Secondary | ICD-10-CM | POA: Diagnosis not present

## 2018-05-05 DIAGNOSIS — G4733 Obstructive sleep apnea (adult) (pediatric): Secondary | ICD-10-CM | POA: Diagnosis not present

## 2018-05-05 DIAGNOSIS — D631 Anemia in chronic kidney disease: Secondary | ICD-10-CM | POA: Diagnosis not present

## 2018-05-05 DIAGNOSIS — I130 Hypertensive heart and chronic kidney disease with heart failure and stage 1 through stage 4 chronic kidney disease, or unspecified chronic kidney disease: Secondary | ICD-10-CM | POA: Diagnosis not present

## 2018-05-05 DIAGNOSIS — Z9181 History of falling: Secondary | ICD-10-CM | POA: Diagnosis not present

## 2018-05-05 DIAGNOSIS — Z7984 Long term (current) use of oral hypoglycemic drugs: Secondary | ICD-10-CM | POA: Diagnosis not present

## 2018-05-05 DIAGNOSIS — I429 Cardiomyopathy, unspecified: Secondary | ICD-10-CM | POA: Diagnosis not present

## 2018-05-05 DIAGNOSIS — E11621 Type 2 diabetes mellitus with foot ulcer: Secondary | ICD-10-CM | POA: Diagnosis not present

## 2018-05-05 DIAGNOSIS — M48061 Spinal stenosis, lumbar region without neurogenic claudication: Secondary | ICD-10-CM | POA: Diagnosis not present

## 2018-05-06 ENCOUNTER — Ambulatory Visit: Payer: Medicare Other | Admitting: Family Medicine

## 2018-05-06 ENCOUNTER — Telehealth: Payer: Self-pay

## 2018-05-06 DIAGNOSIS — D631 Anemia in chronic kidney disease: Secondary | ICD-10-CM | POA: Diagnosis not present

## 2018-05-06 DIAGNOSIS — N183 Chronic kidney disease, stage 3 (moderate): Secondary | ICD-10-CM | POA: Diagnosis not present

## 2018-05-06 DIAGNOSIS — S76111D Strain of right quadriceps muscle, fascia and tendon, subsequent encounter: Secondary | ICD-10-CM | POA: Diagnosis not present

## 2018-05-06 DIAGNOSIS — M6281 Muscle weakness (generalized): Secondary | ICD-10-CM | POA: Diagnosis not present

## 2018-05-06 DIAGNOSIS — E11621 Type 2 diabetes mellitus with foot ulcer: Secondary | ICD-10-CM | POA: Diagnosis not present

## 2018-05-06 DIAGNOSIS — E039 Hypothyroidism, unspecified: Secondary | ICD-10-CM | POA: Diagnosis not present

## 2018-05-06 DIAGNOSIS — M170 Bilateral primary osteoarthritis of knee: Secondary | ICD-10-CM | POA: Diagnosis not present

## 2018-05-06 DIAGNOSIS — Z7901 Long term (current) use of anticoagulants: Secondary | ICD-10-CM | POA: Diagnosis not present

## 2018-05-06 DIAGNOSIS — I2510 Atherosclerotic heart disease of native coronary artery without angina pectoris: Secondary | ICD-10-CM | POA: Diagnosis not present

## 2018-05-06 DIAGNOSIS — G20 Parkinson's disease: Secondary | ICD-10-CM | POA: Diagnosis not present

## 2018-05-06 DIAGNOSIS — E1122 Type 2 diabetes mellitus with diabetic chronic kidney disease: Secondary | ICD-10-CM | POA: Diagnosis not present

## 2018-05-06 DIAGNOSIS — I4819 Other persistent atrial fibrillation: Secondary | ICD-10-CM | POA: Diagnosis not present

## 2018-05-06 DIAGNOSIS — Z9181 History of falling: Secondary | ICD-10-CM | POA: Diagnosis not present

## 2018-05-06 DIAGNOSIS — M1612 Unilateral primary osteoarthritis, left hip: Secondary | ICD-10-CM | POA: Diagnosis not present

## 2018-05-06 DIAGNOSIS — G4733 Obstructive sleep apnea (adult) (pediatric): Secondary | ICD-10-CM | POA: Diagnosis not present

## 2018-05-06 DIAGNOSIS — I130 Hypertensive heart and chronic kidney disease with heart failure and stage 1 through stage 4 chronic kidney disease, or unspecified chronic kidney disease: Secondary | ICD-10-CM | POA: Diagnosis not present

## 2018-05-06 DIAGNOSIS — I5023 Acute on chronic systolic (congestive) heart failure: Secondary | ICD-10-CM | POA: Diagnosis not present

## 2018-05-06 DIAGNOSIS — I5032 Chronic diastolic (congestive) heart failure: Secondary | ICD-10-CM | POA: Diagnosis not present

## 2018-05-06 DIAGNOSIS — I429 Cardiomyopathy, unspecified: Secondary | ICD-10-CM | POA: Diagnosis not present

## 2018-05-06 DIAGNOSIS — M48061 Spinal stenosis, lumbar region without neurogenic claudication: Secondary | ICD-10-CM | POA: Diagnosis not present

## 2018-05-06 DIAGNOSIS — Z7984 Long term (current) use of oral hypoglycemic drugs: Secondary | ICD-10-CM | POA: Diagnosis not present

## 2018-05-06 DIAGNOSIS — Z95810 Presence of automatic (implantable) cardiac defibrillator: Secondary | ICD-10-CM | POA: Diagnosis not present

## 2018-05-06 DIAGNOSIS — L97511 Non-pressure chronic ulcer of other part of right foot limited to breakdown of skin: Secondary | ICD-10-CM | POA: Diagnosis not present

## 2018-05-06 MED ORDER — GLIMEPIRIDE 2 MG PO TABS: 2.0000 mg | ORAL_TABLET | Freq: Every day | ORAL | 1 refills | Status: DC

## 2018-05-06 NOTE — Telephone Encounter (Signed)
[name] [name] called and reports since he has stopped the metformin due to loose stool his blood glucose has gone up in the evenings. She is concerned he may need an increase in the glimepiride. Please advise.   AM glucose 116 mg/dl 833 mg/dl 383 mg/dl   PM glucose 291 mg/dl 916 mg/dl 606 mg/dl 004 mg/dl

## 2018-05-06 NOTE — Telephone Encounter (Signed)
Increase the glimepiride dose to 2 mg while off of metformin.  If blood sugar starts dropping lower than 80 decrease dose back down to 1 mg.

## 2018-05-07 DIAGNOSIS — D631 Anemia in chronic kidney disease: Secondary | ICD-10-CM | POA: Diagnosis not present

## 2018-05-07 DIAGNOSIS — E11621 Type 2 diabetes mellitus with foot ulcer: Secondary | ICD-10-CM | POA: Diagnosis not present

## 2018-05-07 DIAGNOSIS — G4733 Obstructive sleep apnea (adult) (pediatric): Secondary | ICD-10-CM | POA: Diagnosis not present

## 2018-05-07 DIAGNOSIS — Z95810 Presence of automatic (implantable) cardiac defibrillator: Secondary | ICD-10-CM | POA: Diagnosis not present

## 2018-05-07 DIAGNOSIS — I130 Hypertensive heart and chronic kidney disease with heart failure and stage 1 through stage 4 chronic kidney disease, or unspecified chronic kidney disease: Secondary | ICD-10-CM | POA: Diagnosis not present

## 2018-05-07 DIAGNOSIS — M170 Bilateral primary osteoarthritis of knee: Secondary | ICD-10-CM | POA: Diagnosis not present

## 2018-05-07 DIAGNOSIS — I5023 Acute on chronic systolic (congestive) heart failure: Secondary | ICD-10-CM | POA: Diagnosis not present

## 2018-05-07 DIAGNOSIS — G20 Parkinson's disease: Secondary | ICD-10-CM | POA: Diagnosis not present

## 2018-05-07 DIAGNOSIS — I429 Cardiomyopathy, unspecified: Secondary | ICD-10-CM | POA: Diagnosis not present

## 2018-05-07 DIAGNOSIS — E039 Hypothyroidism, unspecified: Secondary | ICD-10-CM | POA: Diagnosis not present

## 2018-05-07 DIAGNOSIS — M48061 Spinal stenosis, lumbar region without neurogenic claudication: Secondary | ICD-10-CM | POA: Diagnosis not present

## 2018-05-07 DIAGNOSIS — Z9181 History of falling: Secondary | ICD-10-CM | POA: Diagnosis not present

## 2018-05-07 DIAGNOSIS — L97511 Non-pressure chronic ulcer of other part of right foot limited to breakdown of skin: Secondary | ICD-10-CM | POA: Diagnosis not present

## 2018-05-07 DIAGNOSIS — I4819 Other persistent atrial fibrillation: Secondary | ICD-10-CM | POA: Diagnosis not present

## 2018-05-07 DIAGNOSIS — E1122 Type 2 diabetes mellitus with diabetic chronic kidney disease: Secondary | ICD-10-CM | POA: Diagnosis not present

## 2018-05-07 DIAGNOSIS — N183 Chronic kidney disease, stage 3 (moderate): Secondary | ICD-10-CM | POA: Diagnosis not present

## 2018-05-07 DIAGNOSIS — Z7901 Long term (current) use of anticoagulants: Secondary | ICD-10-CM | POA: Diagnosis not present

## 2018-05-07 DIAGNOSIS — I2510 Atherosclerotic heart disease of native coronary artery without angina pectoris: Secondary | ICD-10-CM | POA: Diagnosis not present

## 2018-05-07 DIAGNOSIS — M1612 Unilateral primary osteoarthritis, left hip: Secondary | ICD-10-CM | POA: Diagnosis not present

## 2018-05-07 DIAGNOSIS — Z7984 Long term (current) use of oral hypoglycemic drugs: Secondary | ICD-10-CM | POA: Diagnosis not present

## 2018-05-07 NOTE — Telephone Encounter (Signed)
Spoke to patient wife advised her of the medication changes and she verbally understands. [name] [name],CMA

## 2018-05-08 DIAGNOSIS — M170 Bilateral primary osteoarthritis of knee: Secondary | ICD-10-CM | POA: Diagnosis not present

## 2018-05-08 DIAGNOSIS — Z7984 Long term (current) use of oral hypoglycemic drugs: Secondary | ICD-10-CM | POA: Diagnosis not present

## 2018-05-08 DIAGNOSIS — I130 Hypertensive heart and chronic kidney disease with heart failure and stage 1 through stage 4 chronic kidney disease, or unspecified chronic kidney disease: Secondary | ICD-10-CM | POA: Diagnosis not present

## 2018-05-08 DIAGNOSIS — I4819 Other persistent atrial fibrillation: Secondary | ICD-10-CM | POA: Diagnosis not present

## 2018-05-08 DIAGNOSIS — N183 Chronic kidney disease, stage 3 (moderate): Secondary | ICD-10-CM | POA: Diagnosis not present

## 2018-05-08 DIAGNOSIS — G4733 Obstructive sleep apnea (adult) (pediatric): Secondary | ICD-10-CM | POA: Diagnosis not present

## 2018-05-08 DIAGNOSIS — L97511 Non-pressure chronic ulcer of other part of right foot limited to breakdown of skin: Secondary | ICD-10-CM | POA: Diagnosis not present

## 2018-05-08 DIAGNOSIS — E1122 Type 2 diabetes mellitus with diabetic chronic kidney disease: Secondary | ICD-10-CM | POA: Diagnosis not present

## 2018-05-08 DIAGNOSIS — Z9181 History of falling: Secondary | ICD-10-CM | POA: Diagnosis not present

## 2018-05-08 DIAGNOSIS — E11621 Type 2 diabetes mellitus with foot ulcer: Secondary | ICD-10-CM | POA: Diagnosis not present

## 2018-05-08 DIAGNOSIS — M1612 Unilateral primary osteoarthritis, left hip: Secondary | ICD-10-CM | POA: Diagnosis not present

## 2018-05-08 DIAGNOSIS — E039 Hypothyroidism, unspecified: Secondary | ICD-10-CM | POA: Diagnosis not present

## 2018-05-08 DIAGNOSIS — I5023 Acute on chronic systolic (congestive) heart failure: Secondary | ICD-10-CM | POA: Diagnosis not present

## 2018-05-08 DIAGNOSIS — D631 Anemia in chronic kidney disease: Secondary | ICD-10-CM | POA: Diagnosis not present

## 2018-05-08 DIAGNOSIS — I2510 Atherosclerotic heart disease of native coronary artery without angina pectoris: Secondary | ICD-10-CM | POA: Diagnosis not present

## 2018-05-08 DIAGNOSIS — G20 Parkinson's disease: Secondary | ICD-10-CM | POA: Diagnosis not present

## 2018-05-08 DIAGNOSIS — Z95810 Presence of automatic (implantable) cardiac defibrillator: Secondary | ICD-10-CM | POA: Diagnosis not present

## 2018-05-08 DIAGNOSIS — M48061 Spinal stenosis, lumbar region without neurogenic claudication: Secondary | ICD-10-CM | POA: Diagnosis not present

## 2018-05-08 DIAGNOSIS — Z7901 Long term (current) use of anticoagulants: Secondary | ICD-10-CM | POA: Diagnosis not present

## 2018-05-08 DIAGNOSIS — I429 Cardiomyopathy, unspecified: Secondary | ICD-10-CM | POA: Diagnosis not present

## 2018-05-09 DIAGNOSIS — G4733 Obstructive sleep apnea (adult) (pediatric): Secondary | ICD-10-CM | POA: Diagnosis not present

## 2018-05-09 DIAGNOSIS — I2510 Atherosclerotic heart disease of native coronary artery without angina pectoris: Secondary | ICD-10-CM | POA: Diagnosis not present

## 2018-05-09 DIAGNOSIS — M1612 Unilateral primary osteoarthritis, left hip: Secondary | ICD-10-CM | POA: Diagnosis not present

## 2018-05-09 DIAGNOSIS — D631 Anemia in chronic kidney disease: Secondary | ICD-10-CM | POA: Diagnosis not present

## 2018-05-09 DIAGNOSIS — N183 Chronic kidney disease, stage 3 (moderate): Secondary | ICD-10-CM | POA: Diagnosis not present

## 2018-05-09 DIAGNOSIS — M48061 Spinal stenosis, lumbar region without neurogenic claudication: Secondary | ICD-10-CM | POA: Diagnosis not present

## 2018-05-09 DIAGNOSIS — I130 Hypertensive heart and chronic kidney disease with heart failure and stage 1 through stage 4 chronic kidney disease, or unspecified chronic kidney disease: Secondary | ICD-10-CM | POA: Diagnosis not present

## 2018-05-09 DIAGNOSIS — G20 Parkinson's disease: Secondary | ICD-10-CM | POA: Diagnosis not present

## 2018-05-09 DIAGNOSIS — M170 Bilateral primary osteoarthritis of knee: Secondary | ICD-10-CM | POA: Diagnosis not present

## 2018-05-09 DIAGNOSIS — I5023 Acute on chronic systolic (congestive) heart failure: Secondary | ICD-10-CM | POA: Diagnosis not present

## 2018-05-09 DIAGNOSIS — I429 Cardiomyopathy, unspecified: Secondary | ICD-10-CM | POA: Diagnosis not present

## 2018-05-09 DIAGNOSIS — Z95810 Presence of automatic (implantable) cardiac defibrillator: Secondary | ICD-10-CM | POA: Diagnosis not present

## 2018-05-09 DIAGNOSIS — E1122 Type 2 diabetes mellitus with diabetic chronic kidney disease: Secondary | ICD-10-CM | POA: Diagnosis not present

## 2018-05-09 DIAGNOSIS — Z7901 Long term (current) use of anticoagulants: Secondary | ICD-10-CM | POA: Diagnosis not present

## 2018-05-09 DIAGNOSIS — Z9181 History of falling: Secondary | ICD-10-CM | POA: Diagnosis not present

## 2018-05-09 DIAGNOSIS — E11621 Type 2 diabetes mellitus with foot ulcer: Secondary | ICD-10-CM | POA: Diagnosis not present

## 2018-05-09 DIAGNOSIS — E039 Hypothyroidism, unspecified: Secondary | ICD-10-CM | POA: Diagnosis not present

## 2018-05-09 DIAGNOSIS — Z7984 Long term (current) use of oral hypoglycemic drugs: Secondary | ICD-10-CM | POA: Diagnosis not present

## 2018-05-09 DIAGNOSIS — L97511 Non-pressure chronic ulcer of other part of right foot limited to breakdown of skin: Secondary | ICD-10-CM | POA: Diagnosis not present

## 2018-05-09 DIAGNOSIS — I4819 Other persistent atrial fibrillation: Secondary | ICD-10-CM | POA: Diagnosis not present

## 2018-05-12 ENCOUNTER — Other Ambulatory Visit: Payer: Self-pay | Admitting: Family Medicine

## 2018-05-12 DIAGNOSIS — M170 Bilateral primary osteoarthritis of knee: Secondary | ICD-10-CM | POA: Diagnosis not present

## 2018-05-12 DIAGNOSIS — N183 Chronic kidney disease, stage 3 (moderate): Secondary | ICD-10-CM | POA: Diagnosis not present

## 2018-05-12 DIAGNOSIS — Z7984 Long term (current) use of oral hypoglycemic drugs: Secondary | ICD-10-CM | POA: Diagnosis not present

## 2018-05-12 DIAGNOSIS — I2510 Atherosclerotic heart disease of native coronary artery without angina pectoris: Secondary | ICD-10-CM | POA: Diagnosis not present

## 2018-05-12 DIAGNOSIS — G20 Parkinson's disease: Secondary | ICD-10-CM | POA: Diagnosis not present

## 2018-05-12 DIAGNOSIS — I130 Hypertensive heart and chronic kidney disease with heart failure and stage 1 through stage 4 chronic kidney disease, or unspecified chronic kidney disease: Secondary | ICD-10-CM | POA: Diagnosis not present

## 2018-05-12 DIAGNOSIS — I429 Cardiomyopathy, unspecified: Secondary | ICD-10-CM | POA: Diagnosis not present

## 2018-05-12 DIAGNOSIS — E039 Hypothyroidism, unspecified: Secondary | ICD-10-CM | POA: Diagnosis not present

## 2018-05-12 DIAGNOSIS — E1122 Type 2 diabetes mellitus with diabetic chronic kidney disease: Secondary | ICD-10-CM | POA: Diagnosis not present

## 2018-05-12 DIAGNOSIS — I4819 Other persistent atrial fibrillation: Secondary | ICD-10-CM | POA: Diagnosis not present

## 2018-05-12 DIAGNOSIS — G4733 Obstructive sleep apnea (adult) (pediatric): Secondary | ICD-10-CM | POA: Diagnosis not present

## 2018-05-12 DIAGNOSIS — E11621 Type 2 diabetes mellitus with foot ulcer: Secondary | ICD-10-CM | POA: Diagnosis not present

## 2018-05-12 DIAGNOSIS — Z7901 Long term (current) use of anticoagulants: Secondary | ICD-10-CM | POA: Diagnosis not present

## 2018-05-12 DIAGNOSIS — D631 Anemia in chronic kidney disease: Secondary | ICD-10-CM | POA: Diagnosis not present

## 2018-05-12 DIAGNOSIS — L97511 Non-pressure chronic ulcer of other part of right foot limited to breakdown of skin: Secondary | ICD-10-CM | POA: Diagnosis not present

## 2018-05-12 DIAGNOSIS — M1612 Unilateral primary osteoarthritis, left hip: Secondary | ICD-10-CM | POA: Diagnosis not present

## 2018-05-12 DIAGNOSIS — I5023 Acute on chronic systolic (congestive) heart failure: Secondary | ICD-10-CM | POA: Diagnosis not present

## 2018-05-12 DIAGNOSIS — Z95810 Presence of automatic (implantable) cardiac defibrillator: Secondary | ICD-10-CM | POA: Diagnosis not present

## 2018-05-12 DIAGNOSIS — M48061 Spinal stenosis, lumbar region without neurogenic claudication: Secondary | ICD-10-CM | POA: Diagnosis not present

## 2018-05-12 DIAGNOSIS — Z9181 History of falling: Secondary | ICD-10-CM | POA: Diagnosis not present

## 2018-05-13 DIAGNOSIS — Z95810 Presence of automatic (implantable) cardiac defibrillator: Secondary | ICD-10-CM | POA: Diagnosis not present

## 2018-05-13 DIAGNOSIS — E11621 Type 2 diabetes mellitus with foot ulcer: Secondary | ICD-10-CM | POA: Diagnosis not present

## 2018-05-13 DIAGNOSIS — M1612 Unilateral primary osteoarthritis, left hip: Secondary | ICD-10-CM | POA: Diagnosis not present

## 2018-05-13 DIAGNOSIS — Z7901 Long term (current) use of anticoagulants: Secondary | ICD-10-CM | POA: Diagnosis not present

## 2018-05-13 DIAGNOSIS — E039 Hypothyroidism, unspecified: Secondary | ICD-10-CM | POA: Diagnosis not present

## 2018-05-13 DIAGNOSIS — E1122 Type 2 diabetes mellitus with diabetic chronic kidney disease: Secondary | ICD-10-CM | POA: Diagnosis not present

## 2018-05-13 DIAGNOSIS — G20 Parkinson's disease: Secondary | ICD-10-CM | POA: Diagnosis not present

## 2018-05-13 DIAGNOSIS — I5023 Acute on chronic systolic (congestive) heart failure: Secondary | ICD-10-CM | POA: Diagnosis not present

## 2018-05-13 DIAGNOSIS — Z7984 Long term (current) use of oral hypoglycemic drugs: Secondary | ICD-10-CM | POA: Diagnosis not present

## 2018-05-13 DIAGNOSIS — D631 Anemia in chronic kidney disease: Secondary | ICD-10-CM | POA: Diagnosis not present

## 2018-05-13 DIAGNOSIS — I130 Hypertensive heart and chronic kidney disease with heart failure and stage 1 through stage 4 chronic kidney disease, or unspecified chronic kidney disease: Secondary | ICD-10-CM | POA: Diagnosis not present

## 2018-05-13 DIAGNOSIS — M170 Bilateral primary osteoarthritis of knee: Secondary | ICD-10-CM | POA: Diagnosis not present

## 2018-05-13 DIAGNOSIS — L97511 Non-pressure chronic ulcer of other part of right foot limited to breakdown of skin: Secondary | ICD-10-CM | POA: Diagnosis not present

## 2018-05-13 DIAGNOSIS — I4819 Other persistent atrial fibrillation: Secondary | ICD-10-CM | POA: Diagnosis not present

## 2018-05-13 DIAGNOSIS — I2510 Atherosclerotic heart disease of native coronary artery without angina pectoris: Secondary | ICD-10-CM | POA: Diagnosis not present

## 2018-05-13 DIAGNOSIS — Z9181 History of falling: Secondary | ICD-10-CM | POA: Diagnosis not present

## 2018-05-13 DIAGNOSIS — M48061 Spinal stenosis, lumbar region without neurogenic claudication: Secondary | ICD-10-CM | POA: Diagnosis not present

## 2018-05-13 DIAGNOSIS — G4733 Obstructive sleep apnea (adult) (pediatric): Secondary | ICD-10-CM | POA: Diagnosis not present

## 2018-05-13 DIAGNOSIS — N183 Chronic kidney disease, stage 3 (moderate): Secondary | ICD-10-CM | POA: Diagnosis not present

## 2018-05-13 DIAGNOSIS — I429 Cardiomyopathy, unspecified: Secondary | ICD-10-CM | POA: Diagnosis not present

## 2018-05-15 ENCOUNTER — Encounter (HOSPITAL_COMMUNITY): Payer: Self-pay

## 2018-05-15 ENCOUNTER — Ambulatory Visit (HOSPITAL_COMMUNITY)
Admission: RE | Admit: 2018-05-15 | Discharge: 2018-05-15 | Disposition: A | Discharge status: Home / Self Care | Payer: Medicare Other | Source: Ambulatory Visit | Attending: Internal Medicine | Admitting: Internal Medicine

## 2018-05-15 ENCOUNTER — Other Ambulatory Visit: Payer: Self-pay | Admitting: Family Medicine

## 2018-05-15 VITALS — BP 106/60 | HR 64 | Wt 190.4 lb

## 2018-05-15 DIAGNOSIS — I482 Chronic atrial fibrillation, unspecified: Secondary | ICD-10-CM | POA: Diagnosis not present

## 2018-05-15 DIAGNOSIS — M1990 Unspecified osteoarthritis, unspecified site: Secondary | ICD-10-CM | POA: Insufficient documentation

## 2018-05-15 DIAGNOSIS — I951 Orthostatic hypotension: Secondary | ICD-10-CM | POA: Diagnosis not present

## 2018-05-15 DIAGNOSIS — G20 Parkinson's disease: Secondary | ICD-10-CM | POA: Insufficient documentation

## 2018-05-15 DIAGNOSIS — E039 Hypothyroidism, unspecified: Secondary | ICD-10-CM | POA: Insufficient documentation

## 2018-05-15 DIAGNOSIS — I2510 Atherosclerotic heart disease of native coronary artery without angina pectoris: Secondary | ICD-10-CM | POA: Diagnosis not present

## 2018-05-15 DIAGNOSIS — I5022 Chronic systolic (congestive) heart failure: Secondary | ICD-10-CM | POA: Diagnosis not present

## 2018-05-15 DIAGNOSIS — Z7901 Long term (current) use of anticoagulants: Secondary | ICD-10-CM | POA: Insufficient documentation

## 2018-05-15 DIAGNOSIS — Z79899 Other long term (current) drug therapy: Secondary | ICD-10-CM | POA: Insufficient documentation

## 2018-05-15 DIAGNOSIS — N400 Enlarged prostate without lower urinary tract symptoms: Secondary | ICD-10-CM | POA: Insufficient documentation

## 2018-05-15 DIAGNOSIS — E119 Type 2 diabetes mellitus without complications: Secondary | ICD-10-CM | POA: Diagnosis not present

## 2018-05-15 DIAGNOSIS — I110 Hypertensive heart disease with heart failure: Secondary | ICD-10-CM | POA: Insufficient documentation

## 2018-05-15 DIAGNOSIS — G4733 Obstructive sleep apnea (adult) (pediatric): Secondary | ICD-10-CM | POA: Insufficient documentation

## 2018-05-15 DIAGNOSIS — I25708 Atherosclerosis of coronary artery bypass graft(s), unspecified, with other forms of angina pectoris: Secondary | ICD-10-CM | POA: Diagnosis not present

## 2018-05-15 DIAGNOSIS — Z95810 Presence of automatic (implantable) cardiac defibrillator: Secondary | ICD-10-CM | POA: Insufficient documentation

## 2018-05-15 DIAGNOSIS — Z7984 Long term (current) use of oral hypoglycemic drugs: Secondary | ICD-10-CM | POA: Diagnosis not present

## 2018-05-15 DIAGNOSIS — Z79890 Hormone replacement therapy (postmenopausal): Secondary | ICD-10-CM | POA: Diagnosis not present

## 2018-05-15 DIAGNOSIS — F329 Major depressive disorder, single episode, unspecified: Secondary | ICD-10-CM | POA: Insufficient documentation

## 2018-05-15 DIAGNOSIS — L89312 Pressure ulcer of right buttock, stage 2: Secondary | ICD-10-CM

## 2018-05-15 DIAGNOSIS — I4819 Other persistent atrial fibrillation: Secondary | ICD-10-CM | POA: Diagnosis not present

## 2018-05-15 DIAGNOSIS — E785 Hyperlipidemia, unspecified: Secondary | ICD-10-CM | POA: Insufficient documentation

## 2018-05-15 DIAGNOSIS — I428 Other cardiomyopathies: Secondary | ICD-10-CM | POA: Insufficient documentation

## 2018-05-15 DIAGNOSIS — I447 Left bundle-branch block, unspecified: Secondary | ICD-10-CM | POA: Insufficient documentation

## 2018-05-15 LAB — BASIC METABOLIC PANEL
ANION GAP: 7 (ref 5–15)
BUN: 19 mg/dL (ref 8–23)
CALCIUM: 8.7 mg/dL — AB (ref 8.9–10.3)
CO2: 30 mmol/L (ref 22–32)
Chloride: 101 mmol/L (ref 98–111)
Creatinine, Ser: 1.21 mg/dL (ref 0.61–1.24)
GFR calc Af Amer: >60 mL/min (ref >60)
GFR calc non Af Amer: 54 mL/min — ABNORMAL LOW (ref >60)
GLUCOSE: 162 mg/dL — AB (ref 70–99)
POTASSIUM: 3.8 mmol/L (ref 3.5–5.1)
Sodium: 138 mmol/L (ref 135–145)

## 2018-05-15 MED ORDER — AMBULATORY NON FORMULARY MEDICATION: 1 refills | Status: DC

## 2018-05-15 NOTE — Progress Notes (Signed)
Patient ID: [name] [name], male   DOB: 1931/09/02, 84 y.o.   MRN: [id number]     Advanced Heart Failure Clinic Note   Primary Cardiologist: Dr Mayford Knife PCP: Dr Denyse Amass HF Cardiology: Shirlee Latch  HPI: [name] [name] is a 84 y.o. male  with PMH of A tach s/p DC-CV 12/29/12, CAD, PAF on chronic coumadin and amiodarone, OSA- CPAP, NICM cath 2008, chronic systolic heart failure EF 25-30% (01/2013) , S/P Medtronic CRT-D 2008 and generator changed 2012, LBBB, CRI (baseline 1.7-1.8) and Hypothyroidism.   He is S/P DC-CV 12/29/12 and initially he felt good for about a week. He had a functional decline and was only able to walk a few steps. He presented [facility] ED 01/22/13 with increased dyspnea on exertion and low extremity edema. He was started on a lasix gtt 10 mg hr. Admit weight 233 lbs. Discharge weight: 214 lbs.    He has been off Coreg due to fatigue.  He is now off amiodarone due to imbalance/gait instability.   He was started on Sinemet by neurology for suspected Parkinsons-type syndrome.    In 5/19, he was noted to be orthostatic and bisoprolol, ramipril, and spironolactone were stopped.  He was noted to be in an atrial tachycardia at that time . Device interrogation today shows persistent atrial fibrillation since early 7/19.   Echo 10/31/2016 LVEF 35-40%, Mod AI, Mod MR, Severe LAE, Moderate RVH, Severe RAE, PA peak pressure 19 mm Hg.  Echo (10/19) with EF 30-35%, mild LV dilation, normal RV size with mildly decreased systolic function, mild MR, mild AI. PYP scan (10/19) not suggestive of cardiac amyloidosis.   He presents today for regular follow up. At last visit, increased midodrine to 5 mg TID. Due to cost, he has continued to take at 2.5 mg TID. States orthostasis has improved, with very seldom lightheadedness. One fall in the kitchen several weeks ago, but feels it was more balance related. Activity continues to be limited by parkinsonism. Working with rehab twice weekly at home. Denies CP. No undue SOB.  No chest pain, melena, or BRBPR.   Labs 8/21: Na 132 K 4.5 Cr 1.7 (stable)  Labs 03/02/13 NA 135 Potassium 4.6 Creatinine 1.6 Dig 1.6 TSH 3.95        04/02/13: digoxin 0.8, K+ 4.5, creatinine 1.48         09/08/13 K 4.4 Creatine 1.7          6/18: K 4, creatinine 1.32, TSH normal, hgb 12.9         9/18: TSH normal, K 4.4, creatinine 1.37, LFTs normal         11/18: K 4.1, creatinine 1.47, digoxin 0.3          8/19: digoxin < 0.5, K 4.2, creatinine 1.26, Hgb 13.2         Review of systems complete and found to be negative unless listed in HPI.    Past Medical History:  Diagnosis Date  . Anemia, unspecified 04/07/2013  . Atrial fibrillation (HCC)    fibrillation/flutter  . BPH (benign prostatic hypertrophy)   . Chronic systolic CHF (congestive heart failure) (HCC)    Nonischemic DCM EF 10% s/p BiV AICD  . Coronary artery disease 2008   nonobstructive ASCAD  . Depression   . Hyperlipidemia   . Hypertension   . Hypothyroidism   . Left bundle branch block   . Nonischemic cardiomyopathy (HCC)    ef 10 %  . OSA (obstructive sleep apnea)  intolerant to CPAP  . Osteoarthritis   . Sleep apnea    stopped using CPAP  . Thrombocytopenia (HCC)     Current Outpatient Medications  Medication Sig Dispense Refill  . acetaminophen (TYLENOL) 650 MG CR tablet Take 1 tablet (650 mg total) by mouth every 8 (eight) hours as needed for pain. 90 tablet 3  . AMBULATORY NON FORMULARY MEDICATION Lancets pen type sufficient for three times daily checking. Dispense 3 month supply E11.65 1 each 12  . AMBULATORY NON FORMULARY MEDICATION Test strips for One Touch Verio Flex meter.  Test 3x daily.  Disp 3 month supply.  E11.65 1 each 12  . AMBULATORY NON FORMULARY MEDICATION order to [facility] for CNA to come into home and help give baths.order for social work to come into home to help with services. [facility] number is [phone] 1 each 0  . AMBULATORY NON FORMULARY MEDICATION Chair cushion  (donut). Dx: I32.549 Fax: [phone] ([facility]) 1 each 1  . diclofenac sodium (VOLTAREN) 1 % GEL Apply 4 g topically 4 (four) times daily. To affected joint. 1440 g 3  . digoxin (LANOXIN) 0.125 MG tablet TAKE 1 TABLET BY MOUTH  EVERY OTHER DAY 45 tablet 3  . glimepiride (AMARYL) 1 MG tablet TAKE 1 TABLET BY MOUTH  DAILY BEFORE BREAKFAST 90 tablet 1  . glimepiride (AMARYL) 2 MG tablet Take 1 tablet (2 mg total) by mouth daily before breakfast. 90 tablet 1  . levothyroxine (SYNTHROID, LEVOTHROID) 137 MCG tablet TAKE 1 TABLET BY MOUTH  DAILY BEFORE BREAKFAST 90 tablet 1  . midodrine (PROAMATINE) 2.5 MG tablet Take 2.5 mg by mouth 3 (three) times daily with meals.    . mupirocin ointment (BACTROBAN) 2 % Apply to affected area BID for 7 days. 30 g 3  . nystatin cream (MYCOSTATIN) Apply 1 application topically 2 (two) times daily. 60 g 2  . Nystatin POWD Apply liberally to affected area 2 times per day 1 Bottle 11  . potassium chloride SA (K-DUR,KLOR-CON) 20 MEQ tablet Take 1 tablet (20 mEq total) by mouth daily. 90 tablet 3  . simvastatin (ZOCOR) 10 MG tablet TAKE 1 TABLET BY MOUTH  EVERY EVENING 90 tablet 3  . torsemide (DEMADEX) 20 MG tablet Take 1 tablet (20 mg total) by mouth daily. 30 tablet 0  . warfarin (COUMADIN) 1 MG tablet Take 1 mg pill on Monday, Wednesday, Thursday and Friday in addition to 5mg  dose 30 tablet 2  . warfarin (COUMADIN) 1 MG tablet TAKE 1 TABLET BY MOUTH ON  MONDAY, WEDNESDAY, AND  FRIDAY ONLY. (IN ADDITION  TO 5 MG TABLET) 39 tablet 1  . warfarin (COUMADIN) 5 MG tablet TAKE 1 TABLET BY MOUTH  EVERY DAY 90 tablet 3  . carbidopa-levodopa (SINEMET) 25-100 MG tablet Take 1 tablet by mouth 3 (three) times daily.     No current facility-administered medications for this encounter.     Vitals:   05/15/18 1331  BP: 106/60  Pulse: 64  SpO2: 97%  Weight: 86.4 kg (190 lb 6.4 oz)   Wt Readings from Last 3 Encounters:  05/15/18 86.4 kg (190 lb 6.4 oz)  04/22/18 87.5 kg (193  lb)  04/07/18 87.5 kg (192 lb 12.8 oz)    PHYSICAL EXAM: General: NAD, frail.  HEENT: Normal Neck: Supple. JVP not elevated.. Carotids 2+ bilat; no bruits. No thyromegaly or nodule noted. Cor: PMI nondisplaced. RRR, No M/G/R noted Lungs: CTAB, normal effort. Abdomen: Soft, non-tender, non-distended, no HSM. No bruits or  masses. +BS  Extremities: No cyanosis, clubbing, or rash. R and LLE no edema.  Neuro: Alert & orientedx3, cranial nerves grossly intact. moves all 4 extremities w/o difficulty. Affect pleasant   ASSESSMENT & PLAN:  1. Chronic systolic HF: NICM 01/2013 EF 20%; Medtronic CRT-D.  Most recent echo 10/19 was reviewed today, showing EF 30-35%, mild LV dilation, normal RV size with mildly decreased RV systolic function.  NYHA class III symptoms with prominent fatigue, possibly more related to deconditioning and autonomic neuropathy than CHF.  He is not volume overloaded on exam. - Continue torsemide 20 mg daily, BMET today.   - Stay off spironolactone, ramipril, and bisoprolol => due to orthostasis and falls.   - Continue digoxin. Level stable 04/07/18 at 0.3.   2) OSA: Continue nightly CPAP.  3) Atrial fibrillation: This is persistent, has been present since early 7/19 continuously.  He cannot take amiodarone due to prior toxicity.  He has seen Dr. Graciela Husbands who thought rate control + anticoagulation was the best policy.  - Continue warfarin.   4) Orthostatic hypotension: Suspect autonomic neuropathy.  May be part of the parkinsons picture.  BP/symptoms have improved on midodrine.  - Recommended compression stockings. Daughter says it is too hard for him to get these on and off.  - Continue midodrine 2.5 mg TID. Can increase to 5 mg as needed. Prescription written today for 5 mg TID.  - Cardiac amyloidosis is also a consideration with CHF and autonomic neuropathy.  Cannot get MRI with CRT-D device. PYP scan from 10/19 is not suggestive of transthyretin amyloidosis.  Pending myeloma  panel.  5) Parkinsons - type syndrome: Sinemet was initially helping, but not so much now.  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  Per PCP. 6) Diabetes: If addiitonal medications are needed, would consider SGLT2-inhibitor. No change.   Keep 6 week appt with Dr. Loralie Champagne, PA-C  05/15/2018   Greater than 50% of the 25 minute visit was spent in counseling/coordination of care regarding disease state education, salt/fluid restriction, sliding scale diuretics, and medication compliance.

## 2018-05-15 NOTE — Telephone Encounter (Signed)
Tabitha from [facility] called [phone]) to request an order for a donut/chair cushion to help with the Pt's stage 2 pressure ulcer. Reports it is on his right buttock and sitting in the chair without cushion is making it worse. Need order faxed to [phone]. Will pend for PCP signature.

## 2018-05-15 NOTE — Patient Instructions (Addendum)
Labs today We will only contact you if something comes back abnormal or we need to make some changes. Otherwise no news is good news!  Your physician recommends that you schedule a follow-up appointment in: Keep you upcoming appointment on January 14th, 2020 at 12noon.

## 2018-05-15 NOTE — Telephone Encounter (Signed)
Order sent will be faxed.

## 2018-05-16 ENCOUNTER — Telehealth: Payer: Self-pay

## 2018-05-16 DIAGNOSIS — I429 Cardiomyopathy, unspecified: Secondary | ICD-10-CM | POA: Diagnosis not present

## 2018-05-16 DIAGNOSIS — I4819 Other persistent atrial fibrillation: Secondary | ICD-10-CM | POA: Diagnosis not present

## 2018-05-16 DIAGNOSIS — I2510 Atherosclerotic heart disease of native coronary artery without angina pectoris: Secondary | ICD-10-CM | POA: Diagnosis not present

## 2018-05-16 DIAGNOSIS — Z7984 Long term (current) use of oral hypoglycemic drugs: Secondary | ICD-10-CM | POA: Diagnosis not present

## 2018-05-16 DIAGNOSIS — L97511 Non-pressure chronic ulcer of other part of right foot limited to breakdown of skin: Secondary | ICD-10-CM | POA: Diagnosis not present

## 2018-05-16 DIAGNOSIS — M48061 Spinal stenosis, lumbar region without neurogenic claudication: Secondary | ICD-10-CM | POA: Diagnosis not present

## 2018-05-16 DIAGNOSIS — E039 Hypothyroidism, unspecified: Secondary | ICD-10-CM | POA: Diagnosis not present

## 2018-05-16 DIAGNOSIS — Z95810 Presence of automatic (implantable) cardiac defibrillator: Secondary | ICD-10-CM | POA: Diagnosis not present

## 2018-05-16 DIAGNOSIS — D631 Anemia in chronic kidney disease: Secondary | ICD-10-CM | POA: Diagnosis not present

## 2018-05-16 DIAGNOSIS — N183 Chronic kidney disease, stage 3 (moderate): Secondary | ICD-10-CM | POA: Diagnosis not present

## 2018-05-16 DIAGNOSIS — M170 Bilateral primary osteoarthritis of knee: Secondary | ICD-10-CM | POA: Diagnosis not present

## 2018-05-16 DIAGNOSIS — I130 Hypertensive heart and chronic kidney disease with heart failure and stage 1 through stage 4 chronic kidney disease, or unspecified chronic kidney disease: Secondary | ICD-10-CM | POA: Diagnosis not present

## 2018-05-16 DIAGNOSIS — E1122 Type 2 diabetes mellitus with diabetic chronic kidney disease: Secondary | ICD-10-CM | POA: Diagnosis not present

## 2018-05-16 DIAGNOSIS — G4733 Obstructive sleep apnea (adult) (pediatric): Secondary | ICD-10-CM | POA: Diagnosis not present

## 2018-05-16 DIAGNOSIS — Z9181 History of falling: Secondary | ICD-10-CM | POA: Diagnosis not present

## 2018-05-16 DIAGNOSIS — I5023 Acute on chronic systolic (congestive) heart failure: Secondary | ICD-10-CM | POA: Diagnosis not present

## 2018-05-16 DIAGNOSIS — M1612 Unilateral primary osteoarthritis, left hip: Secondary | ICD-10-CM | POA: Diagnosis not present

## 2018-05-16 DIAGNOSIS — E11621 Type 2 diabetes mellitus with foot ulcer: Secondary | ICD-10-CM | POA: Diagnosis not present

## 2018-05-16 DIAGNOSIS — G20 Parkinson's disease: Secondary | ICD-10-CM | POA: Diagnosis not present

## 2018-05-16 DIAGNOSIS — Z7901 Long term (current) use of anticoagulants: Secondary | ICD-10-CM | POA: Diagnosis not present

## 2018-05-16 NOTE — Telephone Encounter (Signed)
[facility] called for wound care orders. They want to apply Xeroform to the wound. Please advise.

## 2018-05-19 DIAGNOSIS — N183 Chronic kidney disease, stage 3 (moderate): Secondary | ICD-10-CM | POA: Diagnosis not present

## 2018-05-19 DIAGNOSIS — I429 Cardiomyopathy, unspecified: Secondary | ICD-10-CM | POA: Diagnosis not present

## 2018-05-19 DIAGNOSIS — L97511 Non-pressure chronic ulcer of other part of right foot limited to breakdown of skin: Secondary | ICD-10-CM | POA: Diagnosis not present

## 2018-05-19 DIAGNOSIS — G20 Parkinson's disease: Secondary | ICD-10-CM | POA: Diagnosis not present

## 2018-05-19 DIAGNOSIS — M48061 Spinal stenosis, lumbar region without neurogenic claudication: Secondary | ICD-10-CM | POA: Diagnosis not present

## 2018-05-19 DIAGNOSIS — D631 Anemia in chronic kidney disease: Secondary | ICD-10-CM | POA: Diagnosis not present

## 2018-05-19 DIAGNOSIS — Z7984 Long term (current) use of oral hypoglycemic drugs: Secondary | ICD-10-CM | POA: Diagnosis not present

## 2018-05-19 DIAGNOSIS — E039 Hypothyroidism, unspecified: Secondary | ICD-10-CM | POA: Diagnosis not present

## 2018-05-19 DIAGNOSIS — Z9181 History of falling: Secondary | ICD-10-CM | POA: Diagnosis not present

## 2018-05-19 DIAGNOSIS — Z95810 Presence of automatic (implantable) cardiac defibrillator: Secondary | ICD-10-CM | POA: Diagnosis not present

## 2018-05-19 DIAGNOSIS — I5023 Acute on chronic systolic (congestive) heart failure: Secondary | ICD-10-CM | POA: Diagnosis not present

## 2018-05-19 DIAGNOSIS — E1122 Type 2 diabetes mellitus with diabetic chronic kidney disease: Secondary | ICD-10-CM | POA: Diagnosis not present

## 2018-05-19 DIAGNOSIS — E11621 Type 2 diabetes mellitus with foot ulcer: Secondary | ICD-10-CM | POA: Diagnosis not present

## 2018-05-19 DIAGNOSIS — M1612 Unilateral primary osteoarthritis, left hip: Secondary | ICD-10-CM | POA: Diagnosis not present

## 2018-05-19 DIAGNOSIS — I130 Hypertensive heart and chronic kidney disease with heart failure and stage 1 through stage 4 chronic kidney disease, or unspecified chronic kidney disease: Secondary | ICD-10-CM | POA: Diagnosis not present

## 2018-05-19 DIAGNOSIS — Z7901 Long term (current) use of anticoagulants: Secondary | ICD-10-CM | POA: Diagnosis not present

## 2018-05-19 DIAGNOSIS — I4819 Other persistent atrial fibrillation: Secondary | ICD-10-CM | POA: Diagnosis not present

## 2018-05-19 DIAGNOSIS — I2510 Atherosclerotic heart disease of native coronary artery without angina pectoris: Secondary | ICD-10-CM | POA: Diagnosis not present

## 2018-05-19 DIAGNOSIS — M170 Bilateral primary osteoarthritis of knee: Secondary | ICD-10-CM | POA: Diagnosis not present

## 2018-05-19 DIAGNOSIS — G4733 Obstructive sleep apnea (adult) (pediatric): Secondary | ICD-10-CM | POA: Diagnosis not present

## 2018-05-19 NOTE — Telephone Encounter (Signed)
Okay to give verbal order.

## 2018-05-19 NOTE — Telephone Encounter (Signed)
[facility] advised.

## 2018-05-20 ENCOUNTER — Ambulatory Visit (INDEPENDENT_AMBULATORY_CARE_PROVIDER_SITE_OTHER): Payer: Medicare Other | Admitting: Family Medicine

## 2018-05-20 ENCOUNTER — Encounter: Payer: Self-pay | Admitting: Family Medicine

## 2018-05-20 VITALS — BP 107/41 | HR 78 | Ht 72.0 in | Wt 193.0 lb

## 2018-05-20 DIAGNOSIS — L89151 Pressure ulcer of sacral region, stage 1: Secondary | ICD-10-CM

## 2018-05-20 DIAGNOSIS — D631 Anemia in chronic kidney disease: Secondary | ICD-10-CM | POA: Diagnosis not present

## 2018-05-20 DIAGNOSIS — Z9181 History of falling: Secondary | ICD-10-CM | POA: Diagnosis not present

## 2018-05-20 DIAGNOSIS — Z7984 Long term (current) use of oral hypoglycemic drugs: Secondary | ICD-10-CM | POA: Diagnosis not present

## 2018-05-20 DIAGNOSIS — I2510 Atherosclerotic heart disease of native coronary artery without angina pectoris: Secondary | ICD-10-CM | POA: Diagnosis not present

## 2018-05-20 DIAGNOSIS — E1122 Type 2 diabetes mellitus with diabetic chronic kidney disease: Secondary | ICD-10-CM | POA: Diagnosis not present

## 2018-05-20 DIAGNOSIS — I428 Other cardiomyopathies: Secondary | ICD-10-CM

## 2018-05-20 DIAGNOSIS — N183 Chronic kidney disease, stage 3 unspecified: Secondary | ICD-10-CM

## 2018-05-20 DIAGNOSIS — G20 Parkinson's disease: Secondary | ICD-10-CM | POA: Diagnosis not present

## 2018-05-20 DIAGNOSIS — I4819 Other persistent atrial fibrillation: Secondary | ICD-10-CM

## 2018-05-20 DIAGNOSIS — M170 Bilateral primary osteoarthritis of knee: Secondary | ICD-10-CM | POA: Diagnosis not present

## 2018-05-20 DIAGNOSIS — I130 Hypertensive heart and chronic kidney disease with heart failure and stage 1 through stage 4 chronic kidney disease, or unspecified chronic kidney disease: Secondary | ICD-10-CM | POA: Diagnosis not present

## 2018-05-20 DIAGNOSIS — M1612 Unilateral primary osteoarthritis, left hip: Secondary | ICD-10-CM | POA: Diagnosis not present

## 2018-05-20 DIAGNOSIS — Z7901 Long term (current) use of anticoagulants: Secondary | ICD-10-CM | POA: Diagnosis not present

## 2018-05-20 DIAGNOSIS — I429 Cardiomyopathy, unspecified: Secondary | ICD-10-CM | POA: Diagnosis not present

## 2018-05-20 DIAGNOSIS — G4733 Obstructive sleep apnea (adult) (pediatric): Secondary | ICD-10-CM | POA: Diagnosis not present

## 2018-05-20 DIAGNOSIS — I5023 Acute on chronic systolic (congestive) heart failure: Secondary | ICD-10-CM | POA: Diagnosis not present

## 2018-05-20 DIAGNOSIS — E039 Hypothyroidism, unspecified: Secondary | ICD-10-CM | POA: Diagnosis not present

## 2018-05-20 DIAGNOSIS — E11621 Type 2 diabetes mellitus with foot ulcer: Secondary | ICD-10-CM | POA: Diagnosis not present

## 2018-05-20 DIAGNOSIS — I10 Essential (primary) hypertension: Secondary | ICD-10-CM

## 2018-05-20 DIAGNOSIS — M48061 Spinal stenosis, lumbar region without neurogenic claudication: Secondary | ICD-10-CM | POA: Diagnosis not present

## 2018-05-20 DIAGNOSIS — I25708 Atherosclerosis of coronary artery bypass graft(s), unspecified, with other forms of angina pectoris: Secondary | ICD-10-CM

## 2018-05-20 DIAGNOSIS — L97511 Non-pressure chronic ulcer of other part of right foot limited to breakdown of skin: Secondary | ICD-10-CM | POA: Diagnosis not present

## 2018-05-20 DIAGNOSIS — L8990 Pressure ulcer of unspecified site, unspecified stage: Secondary | ICD-10-CM | POA: Insufficient documentation

## 2018-05-20 DIAGNOSIS — Z95810 Presence of automatic (implantable) cardiac defibrillator: Secondary | ICD-10-CM | POA: Diagnosis not present

## 2018-05-20 MED ORDER — DAPAGLIFLOZIN PROPANEDIOL 5 MG PO TABS: 5.0000 mg | ORAL_TABLET | Freq: Every day | ORAL | 3 refills | Status: DC

## 2018-05-20 NOTE — Patient Instructions (Signed)
Thank you for coming in today. We will try to get farxiga or jardiance approved for both diabetes and heart failure.  Recheck as scheduled on Dec 26th.   Let me know what happens with [place].

## 2018-05-20 NOTE — Progress Notes (Signed)
[name] [name] is a 84 y.o. male who presents to [name]: Primary Care Sports Medicine today for follow-up pressure sore, heart disease, diabetes.  Patient has diabetes which is currently managed with glimepiride total of 2 milligrams daily.  His blood sugars typically range from about 100-160s.  He denies any hypoglycemic episodes.  He feels pretty well.  He notes that his diarrhea did not change too much when he stopped metformin.  His cardiologist noted that he may have some benefit from his heart disease with SGLT2 class of medications including Jardiance and Farxiga.  Heart disease: Patient has multiple related heart conditions including cardiomyopathy, CHF, atrial fibrillation, hyper cases listed below.  He tolerates them reasonably well.  As noted above cardiology recommends consideration of Jardiance or Farxiga.  Pressure sore: [name] has a sacral decubitus ulcer currently managed with home health nursing.  He notes this is improving significantly.  He is happy with how things are going.  Additionally he notes he did receive his diabetic shoes and notes these are helpful as well for his foot wound.   ROS as above:  Exam:  BP (!) 107/41   Pulse 78   Ht 6' (1.829 m)   Wt 193 lb (87.5 kg)   BMI 26.18 kg/m  Wt Readings from Last 5 Encounters:  05/20/18 193 lb (87.5 kg)  05/15/18 190 lb 6.4 oz (86.4 kg)  04/22/18 193 lb (87.5 kg)  04/07/18 192 lb 12.8 oz (87.5 kg)  03/27/18 195 lb (88.5 kg)    Gen: Well NAD HEENT: EOMI,  MMM Lungs: Normal work of breathing. CTABL Heart: RRR no MRG Abd: NABS, Soft. Nondistended, Nontender Exts: Brisk capillary refill, warm and well perfused.  Sacrum: Slight redness of skin with no breakage at sacrum.  Lab and Radiology Results No results found for this or any previous visit (from the past 72 hour(s)). No results found.    Assessment and  Plan: 84 y.o. male with  Diabetes: Reasonably well-controlled on Amaryl.  [name] would be better as this will control blood sugar quite well well helping heart failure symptoms as well.  However this medicine is going to be expensive even if it is covered.  Will attempt to prescribe and if expensive consider tier exemption or patient assistance program.  In the meantime while this is processing continue Amaryl.  Heart disease as above.  Doing reasonably well.  Midodrine has made a difference.  Attempted INR point-of-care test x2 today and was unable to get enough blood to run the test.  Patient refused phlebotomy.  His INR is been stable recently.  Plan to recheck in about a month.  Pressure ulcer/sore: Doing well continue current regimen and home health nursing.  Continue diabetic shoes.   No orders of the defined types were placed in this encounter.  Meds ordered this encounter  Medications  . dapagliflozin propanediol (FARXIGA) 5 MG TABS tablet    Sig: Take 5 mg by mouth daily.    Dispense:  30 tablet    Refill:  3    For CHF and DM     Historical information moved to improve visibility of documentation.  Past Medical History:  Diagnosis Date  . Anemia, unspecified 04/07/2013  . Atrial fibrillation (HCC)    fibrillation/flutter  . BPH (benign prostatic hypertrophy)   . Chronic systolic CHF (congestive heart failure) (HCC)    Nonischemic DCM EF 10% s/p BiV AICD  . Coronary artery disease 2008  nonobstructive ASCAD  . Depression   . Hyperlipidemia   . Hypertension   . Hypothyroidism   . Left bundle branch block   . Nonischemic cardiomyopathy (HCC)    ef 10 %  . OSA (obstructive sleep apnea)    intolerant to CPAP  . Osteoarthritis   . Sleep apnea    stopped using CPAP  . Thrombocytopenia (HCC)    Past Surgical History:  Procedure Laterality Date  . CARDIAC DEFIBRILLATOR PLACEMENT     left chest  . CARDIOVERSION N/A 12/29/2012   Procedure: CARDIOVERSION;  Surgeon:  Quintella Reichert, MD;  Location: [facility] ENDOSCOPY;  Service: Cardiovascular;  Laterality: N/A;  . CHOLECYSTECTOMY    . Colonscopy     reportedly negative per pt; around 2005.    . EP IMPLANTABLE DEVICE N/A 05/09/2016   Procedure: BIVI ICD Generator Changeout;  Surgeon: Duke Salvia, MD;  Location: [facility] INVASIVE CV LAB;  Service: Cardiovascular;  Laterality: N/A;  . TRANSURETHRAL RESECTION OF PROSTATE     for BPH   Social History   Tobacco Use  . Smoking status: Never Smoker  . Smokeless tobacco: Never Used  Substance Use Topics  . Alcohol use: No   family history includes Cancer in his maternal uncle; Heart disease in his father; Heart failure in his mother; Stroke in his mother.  Medications: Current Outpatient Medications  Medication Sig Dispense Refill  . acetaminophen (TYLENOL) 650 MG CR tablet Take 1 tablet (650 mg total) by mouth every 8 (eight) hours as needed for pain. 90 tablet 3  . AMBULATORY NON FORMULARY MEDICATION Lancets pen type sufficient for three times daily checking. Dispense 3 month supply E11.65 1 each 12  . AMBULATORY NON FORMULARY MEDICATION Test strips for One Touch Verio Flex meter.  Test 3x daily.  Disp 3 month supply.  E11.65 1 each 12  . AMBULATORY NON FORMULARY MEDICATION order to [facility] for CNA to come into home and help give baths.order for social work to come into home to help with services. [facility] number is [phone] 1 each 0  . AMBULATORY NON FORMULARY MEDICATION Chair cushion (donut). Dx: U98.119 Fax: [phone] ([facility]) 1 each 1  . carbidopa-levodopa (SINEMET) 25-100 MG tablet Take 1 tablet by mouth 3 (three) times daily.    . diclofenac sodium (VOLTAREN) 1 % GEL Apply 4 g topically 4 (four) times daily. To affected joint. 1440 g 3  . digoxin (LANOXIN) 0.125 MG tablet TAKE 1 TABLET BY MOUTH  EVERY OTHER DAY 45 tablet 3  . glimepiride (AMARYL) 2 MG tablet Take 1 tablet (2 mg total) by mouth daily before breakfast. 90 tablet 1  .  levothyroxine (SYNTHROID, LEVOTHROID) 137 MCG tablet TAKE 1 TABLET BY MOUTH  DAILY BEFORE BREAKFAST 90 tablet 1  . midodrine (PROAMATINE) 2.5 MG tablet Take 2.5 mg by mouth 3 (three) times daily with meals.    . mupirocin ointment (BACTROBAN) 2 % Apply to affected area BID for 7 days. 30 g 3  . nystatin cream (MYCOSTATIN) Apply 1 application topically 2 (two) times daily. 60 g 2  . Nystatin POWD Apply liberally to affected area 2 times per day 1 Bottle 11  . potassium chloride SA (K-DUR,KLOR-CON) 20 MEQ tablet Take 1 tablet (20 mEq total) by mouth daily. 90 tablet 3  . simvastatin (ZOCOR) 10 MG tablet TAKE 1 TABLET BY MOUTH  EVERY EVENING 90 tablet 3  . torsemide (DEMADEX) 20 MG tablet Take 1 tablet (20 mg total) by mouth daily.  30 tablet 0  . warfarin (COUMADIN) 1 MG tablet Take 1 mg pill on Monday, Wednesday, Thursday and Friday in addition to 5mg  dose 30 tablet 2  . warfarin (COUMADIN) 1 MG tablet TAKE 1 TABLET BY MOUTH ON  MONDAY, WEDNESDAY, AND  FRIDAY ONLY. (IN ADDITION  TO 5 MG TABLET) 39 tablet 1  . warfarin (COUMADIN) 5 MG tablet TAKE 1 TABLET BY MOUTH  EVERY DAY 90 tablet 3  . dapagliflozin propanediol (FARXIGA) 5 MG TABS tablet Take 5 mg by mouth daily. 30 tablet 3   No current facility-administered medications for this visit.    Allergies  Allergen Reactions  . Amiodarone Other (See Comments)    Neuro toxicity     Discussed warning signs or symptoms. Please see discharge instructions. Patient expresses understanding.

## 2018-05-21 ENCOUNTER — Ambulatory Visit (INDEPENDENT_AMBULATORY_CARE_PROVIDER_SITE_OTHER): Payer: Medicare Other

## 2018-05-21 DIAGNOSIS — D631 Anemia in chronic kidney disease: Secondary | ICD-10-CM | POA: Diagnosis not present

## 2018-05-21 DIAGNOSIS — E039 Hypothyroidism, unspecified: Secondary | ICD-10-CM | POA: Diagnosis not present

## 2018-05-21 DIAGNOSIS — I428 Other cardiomyopathies: Secondary | ICD-10-CM

## 2018-05-21 DIAGNOSIS — I130 Hypertensive heart and chronic kidney disease with heart failure and stage 1 through stage 4 chronic kidney disease, or unspecified chronic kidney disease: Secondary | ICD-10-CM | POA: Diagnosis not present

## 2018-05-21 DIAGNOSIS — E1122 Type 2 diabetes mellitus with diabetic chronic kidney disease: Secondary | ICD-10-CM | POA: Diagnosis not present

## 2018-05-21 DIAGNOSIS — I2510 Atherosclerotic heart disease of native coronary artery without angina pectoris: Secondary | ICD-10-CM | POA: Diagnosis not present

## 2018-05-21 DIAGNOSIS — Z95810 Presence of automatic (implantable) cardiac defibrillator: Secondary | ICD-10-CM | POA: Diagnosis not present

## 2018-05-21 DIAGNOSIS — I5022 Chronic systolic (congestive) heart failure: Secondary | ICD-10-CM

## 2018-05-21 DIAGNOSIS — M48061 Spinal stenosis, lumbar region without neurogenic claudication: Secondary | ICD-10-CM | POA: Diagnosis not present

## 2018-05-21 DIAGNOSIS — N183 Chronic kidney disease, stage 3 (moderate): Secondary | ICD-10-CM | POA: Diagnosis not present

## 2018-05-21 DIAGNOSIS — Z7984 Long term (current) use of oral hypoglycemic drugs: Secondary | ICD-10-CM | POA: Diagnosis not present

## 2018-05-21 DIAGNOSIS — L97511 Non-pressure chronic ulcer of other part of right foot limited to breakdown of skin: Secondary | ICD-10-CM | POA: Diagnosis not present

## 2018-05-21 DIAGNOSIS — Z9181 History of falling: Secondary | ICD-10-CM | POA: Diagnosis not present

## 2018-05-21 DIAGNOSIS — Z7901 Long term (current) use of anticoagulants: Secondary | ICD-10-CM | POA: Diagnosis not present

## 2018-05-21 DIAGNOSIS — M170 Bilateral primary osteoarthritis of knee: Secondary | ICD-10-CM | POA: Diagnosis not present

## 2018-05-21 DIAGNOSIS — M1612 Unilateral primary osteoarthritis, left hip: Secondary | ICD-10-CM | POA: Diagnosis not present

## 2018-05-21 DIAGNOSIS — G20 Parkinson's disease: Secondary | ICD-10-CM | POA: Diagnosis not present

## 2018-05-21 DIAGNOSIS — I4819 Other persistent atrial fibrillation: Secondary | ICD-10-CM | POA: Diagnosis not present

## 2018-05-21 DIAGNOSIS — E11621 Type 2 diabetes mellitus with foot ulcer: Secondary | ICD-10-CM | POA: Diagnosis not present

## 2018-05-21 DIAGNOSIS — I429 Cardiomyopathy, unspecified: Secondary | ICD-10-CM | POA: Diagnosis not present

## 2018-05-21 DIAGNOSIS — I5023 Acute on chronic systolic (congestive) heart failure: Secondary | ICD-10-CM | POA: Diagnosis not present

## 2018-05-21 DIAGNOSIS — G4733 Obstructive sleep apnea (adult) (pediatric): Secondary | ICD-10-CM | POA: Diagnosis not present

## 2018-05-21 NOTE — Progress Notes (Signed)
Remote ICD transmission.   

## 2018-05-23 DIAGNOSIS — R531 Weakness: Secondary | ICD-10-CM | POA: Diagnosis not present

## 2018-05-23 DIAGNOSIS — M170 Bilateral primary osteoarthritis of knee: Secondary | ICD-10-CM | POA: Diagnosis not present

## 2018-05-23 DIAGNOSIS — G4733 Obstructive sleep apnea (adult) (pediatric): Secondary | ICD-10-CM | POA: Diagnosis not present

## 2018-05-23 DIAGNOSIS — E039 Hypothyroidism, unspecified: Secondary | ICD-10-CM | POA: Diagnosis not present

## 2018-05-23 DIAGNOSIS — R197 Diarrhea, unspecified: Secondary | ICD-10-CM | POA: Diagnosis not present

## 2018-05-23 DIAGNOSIS — L97511 Non-pressure chronic ulcer of other part of right foot limited to breakdown of skin: Secondary | ICD-10-CM | POA: Diagnosis not present

## 2018-05-23 DIAGNOSIS — I5023 Acute on chronic systolic (congestive) heart failure: Secondary | ICD-10-CM | POA: Diagnosis not present

## 2018-05-23 DIAGNOSIS — Z79899 Other long term (current) drug therapy: Secondary | ICD-10-CM | POA: Diagnosis not present

## 2018-05-23 DIAGNOSIS — I509 Heart failure, unspecified: Secondary | ICD-10-CM | POA: Diagnosis not present

## 2018-05-23 DIAGNOSIS — Z7984 Long term (current) use of oral hypoglycemic drugs: Secondary | ICD-10-CM | POA: Diagnosis not present

## 2018-05-23 DIAGNOSIS — E1122 Type 2 diabetes mellitus with diabetic chronic kidney disease: Secondary | ICD-10-CM | POA: Diagnosis not present

## 2018-05-23 DIAGNOSIS — Z888 Allergy status to other drugs, medicaments and biological substances status: Secondary | ICD-10-CM | POA: Diagnosis not present

## 2018-05-23 DIAGNOSIS — D631 Anemia in chronic kidney disease: Secondary | ICD-10-CM | POA: Diagnosis not present

## 2018-05-23 DIAGNOSIS — G20 Parkinson's disease: Secondary | ICD-10-CM | POA: Diagnosis not present

## 2018-05-23 DIAGNOSIS — I2510 Atherosclerotic heart disease of native coronary artery without angina pectoris: Secondary | ICD-10-CM | POA: Diagnosis not present

## 2018-05-23 DIAGNOSIS — Z87891 Personal history of nicotine dependence: Secondary | ICD-10-CM | POA: Diagnosis not present

## 2018-05-23 DIAGNOSIS — I130 Hypertensive heart and chronic kidney disease with heart failure and stage 1 through stage 4 chronic kidney disease, or unspecified chronic kidney disease: Secondary | ICD-10-CM | POA: Diagnosis not present

## 2018-05-23 DIAGNOSIS — Z95810 Presence of automatic (implantable) cardiac defibrillator: Secondary | ICD-10-CM | POA: Diagnosis not present

## 2018-05-23 DIAGNOSIS — M48061 Spinal stenosis, lumbar region without neurogenic claudication: Secondary | ICD-10-CM | POA: Diagnosis not present

## 2018-05-23 DIAGNOSIS — M1612 Unilateral primary osteoarthritis, left hip: Secondary | ICD-10-CM | POA: Diagnosis not present

## 2018-05-23 DIAGNOSIS — I4510 Unspecified right bundle-branch block: Secondary | ICD-10-CM | POA: Diagnosis not present

## 2018-05-23 DIAGNOSIS — Z9181 History of falling: Secondary | ICD-10-CM | POA: Diagnosis not present

## 2018-05-23 DIAGNOSIS — Z9049 Acquired absence of other specified parts of digestive tract: Secondary | ICD-10-CM | POA: Diagnosis not present

## 2018-05-23 DIAGNOSIS — I429 Cardiomyopathy, unspecified: Secondary | ICD-10-CM | POA: Diagnosis not present

## 2018-05-23 DIAGNOSIS — I4819 Other persistent atrial fibrillation: Secondary | ICD-10-CM | POA: Diagnosis not present

## 2018-05-23 DIAGNOSIS — Z7901 Long term (current) use of anticoagulants: Secondary | ICD-10-CM | POA: Diagnosis not present

## 2018-05-23 DIAGNOSIS — Z8679 Personal history of other diseases of the circulatory system: Secondary | ICD-10-CM | POA: Diagnosis not present

## 2018-05-23 DIAGNOSIS — N183 Chronic kidney disease, stage 3 (moderate): Secondary | ICD-10-CM | POA: Diagnosis not present

## 2018-05-23 DIAGNOSIS — E11621 Type 2 diabetes mellitus with foot ulcer: Secondary | ICD-10-CM | POA: Diagnosis not present

## 2018-05-26 DIAGNOSIS — G20 Parkinson's disease: Secondary | ICD-10-CM | POA: Diagnosis not present

## 2018-05-26 DIAGNOSIS — I5023 Acute on chronic systolic (congestive) heart failure: Secondary | ICD-10-CM | POA: Diagnosis not present

## 2018-05-26 DIAGNOSIS — I2510 Atherosclerotic heart disease of native coronary artery without angina pectoris: Secondary | ICD-10-CM | POA: Diagnosis not present

## 2018-05-26 DIAGNOSIS — M48061 Spinal stenosis, lumbar region without neurogenic claudication: Secondary | ICD-10-CM | POA: Diagnosis not present

## 2018-05-26 DIAGNOSIS — Z7901 Long term (current) use of anticoagulants: Secondary | ICD-10-CM | POA: Diagnosis not present

## 2018-05-26 DIAGNOSIS — I429 Cardiomyopathy, unspecified: Secondary | ICD-10-CM | POA: Diagnosis not present

## 2018-05-26 DIAGNOSIS — L97511 Non-pressure chronic ulcer of other part of right foot limited to breakdown of skin: Secondary | ICD-10-CM | POA: Diagnosis not present

## 2018-05-26 DIAGNOSIS — Z95810 Presence of automatic (implantable) cardiac defibrillator: Secondary | ICD-10-CM | POA: Diagnosis not present

## 2018-05-26 DIAGNOSIS — M6281 Muscle weakness (generalized): Secondary | ICD-10-CM | POA: Diagnosis not present

## 2018-05-26 DIAGNOSIS — E039 Hypothyroidism, unspecified: Secondary | ICD-10-CM | POA: Diagnosis not present

## 2018-05-26 DIAGNOSIS — E11621 Type 2 diabetes mellitus with foot ulcer: Secondary | ICD-10-CM | POA: Diagnosis not present

## 2018-05-26 DIAGNOSIS — M1612 Unilateral primary osteoarthritis, left hip: Secondary | ICD-10-CM | POA: Diagnosis not present

## 2018-05-26 DIAGNOSIS — Z7984 Long term (current) use of oral hypoglycemic drugs: Secondary | ICD-10-CM | POA: Diagnosis not present

## 2018-05-26 DIAGNOSIS — D631 Anemia in chronic kidney disease: Secondary | ICD-10-CM | POA: Diagnosis not present

## 2018-05-26 DIAGNOSIS — N183 Chronic kidney disease, stage 3 (moderate): Secondary | ICD-10-CM | POA: Diagnosis not present

## 2018-05-26 DIAGNOSIS — E1122 Type 2 diabetes mellitus with diabetic chronic kidney disease: Secondary | ICD-10-CM | POA: Diagnosis not present

## 2018-05-26 DIAGNOSIS — I130 Hypertensive heart and chronic kidney disease with heart failure and stage 1 through stage 4 chronic kidney disease, or unspecified chronic kidney disease: Secondary | ICD-10-CM | POA: Diagnosis not present

## 2018-05-26 DIAGNOSIS — Z9181 History of falling: Secondary | ICD-10-CM | POA: Diagnosis not present

## 2018-05-26 DIAGNOSIS — I4819 Other persistent atrial fibrillation: Secondary | ICD-10-CM | POA: Diagnosis not present

## 2018-05-26 DIAGNOSIS — G4733 Obstructive sleep apnea (adult) (pediatric): Secondary | ICD-10-CM | POA: Diagnosis not present

## 2018-05-26 DIAGNOSIS — M170 Bilateral primary osteoarthritis of knee: Secondary | ICD-10-CM | POA: Diagnosis not present

## 2018-05-27 ENCOUNTER — Telehealth: Payer: Self-pay | Admitting: Family Medicine

## 2018-05-27 DIAGNOSIS — G20 Parkinson's disease: Secondary | ICD-10-CM | POA: Diagnosis not present

## 2018-05-27 DIAGNOSIS — Z9181 History of falling: Secondary | ICD-10-CM | POA: Diagnosis not present

## 2018-05-27 DIAGNOSIS — E11621 Type 2 diabetes mellitus with foot ulcer: Secondary | ICD-10-CM | POA: Diagnosis not present

## 2018-05-27 DIAGNOSIS — Z7901 Long term (current) use of anticoagulants: Secondary | ICD-10-CM | POA: Diagnosis not present

## 2018-05-27 DIAGNOSIS — I2510 Atherosclerotic heart disease of native coronary artery without angina pectoris: Secondary | ICD-10-CM | POA: Diagnosis not present

## 2018-05-27 DIAGNOSIS — I429 Cardiomyopathy, unspecified: Secondary | ICD-10-CM | POA: Diagnosis not present

## 2018-05-27 DIAGNOSIS — I5023 Acute on chronic systolic (congestive) heart failure: Secondary | ICD-10-CM | POA: Diagnosis not present

## 2018-05-27 DIAGNOSIS — D631 Anemia in chronic kidney disease: Secondary | ICD-10-CM | POA: Diagnosis not present

## 2018-05-27 DIAGNOSIS — M1612 Unilateral primary osteoarthritis, left hip: Secondary | ICD-10-CM | POA: Diagnosis not present

## 2018-05-27 DIAGNOSIS — M170 Bilateral primary osteoarthritis of knee: Secondary | ICD-10-CM | POA: Diagnosis not present

## 2018-05-27 DIAGNOSIS — I130 Hypertensive heart and chronic kidney disease with heart failure and stage 1 through stage 4 chronic kidney disease, or unspecified chronic kidney disease: Secondary | ICD-10-CM | POA: Diagnosis not present

## 2018-05-27 DIAGNOSIS — N183 Chronic kidney disease, stage 3 (moderate): Secondary | ICD-10-CM | POA: Diagnosis not present

## 2018-05-27 DIAGNOSIS — E1122 Type 2 diabetes mellitus with diabetic chronic kidney disease: Secondary | ICD-10-CM | POA: Diagnosis not present

## 2018-05-27 DIAGNOSIS — G4733 Obstructive sleep apnea (adult) (pediatric): Secondary | ICD-10-CM | POA: Diagnosis not present

## 2018-05-27 DIAGNOSIS — I4819 Other persistent atrial fibrillation: Secondary | ICD-10-CM | POA: Diagnosis not present

## 2018-05-27 DIAGNOSIS — Z95810 Presence of automatic (implantable) cardiac defibrillator: Secondary | ICD-10-CM | POA: Diagnosis not present

## 2018-05-27 DIAGNOSIS — M48061 Spinal stenosis, lumbar region without neurogenic claudication: Secondary | ICD-10-CM | POA: Diagnosis not present

## 2018-05-27 DIAGNOSIS — M6281 Muscle weakness (generalized): Secondary | ICD-10-CM | POA: Diagnosis not present

## 2018-05-27 DIAGNOSIS — L97511 Non-pressure chronic ulcer of other part of right foot limited to breakdown of skin: Secondary | ICD-10-CM | POA: Diagnosis not present

## 2018-05-27 DIAGNOSIS — Z7984 Long term (current) use of oral hypoglycemic drugs: Secondary | ICD-10-CM | POA: Diagnosis not present

## 2018-05-27 DIAGNOSIS — E039 Hypothyroidism, unspecified: Secondary | ICD-10-CM | POA: Diagnosis not present

## 2018-05-27 NOTE — Telephone Encounter (Signed)
Patient developed weakness and called after hours nurse line.  He went to the emergency department where he evaluation.  Evaluation was relatively normal and he was asked to follow-up with me in the near future.

## 2018-05-28 DIAGNOSIS — N183 Chronic kidney disease, stage 3 (moderate): Secondary | ICD-10-CM | POA: Diagnosis not present

## 2018-05-28 DIAGNOSIS — I130 Hypertensive heart and chronic kidney disease with heart failure and stage 1 through stage 4 chronic kidney disease, or unspecified chronic kidney disease: Secondary | ICD-10-CM | POA: Diagnosis not present

## 2018-05-28 DIAGNOSIS — I2510 Atherosclerotic heart disease of native coronary artery without angina pectoris: Secondary | ICD-10-CM | POA: Diagnosis not present

## 2018-05-28 DIAGNOSIS — G4733 Obstructive sleep apnea (adult) (pediatric): Secondary | ICD-10-CM | POA: Diagnosis not present

## 2018-05-28 DIAGNOSIS — Z95810 Presence of automatic (implantable) cardiac defibrillator: Secondary | ICD-10-CM | POA: Diagnosis not present

## 2018-05-28 DIAGNOSIS — D631 Anemia in chronic kidney disease: Secondary | ICD-10-CM | POA: Diagnosis not present

## 2018-05-28 DIAGNOSIS — I420 Dilated cardiomyopathy: Secondary | ICD-10-CM | POA: Diagnosis not present

## 2018-05-28 DIAGNOSIS — I5023 Acute on chronic systolic (congestive) heart failure: Secondary | ICD-10-CM | POA: Diagnosis not present

## 2018-05-28 DIAGNOSIS — I4819 Other persistent atrial fibrillation: Secondary | ICD-10-CM | POA: Diagnosis not present

## 2018-05-28 DIAGNOSIS — L89312 Pressure ulcer of right buttock, stage 2: Secondary | ICD-10-CM | POA: Diagnosis not present

## 2018-05-28 DIAGNOSIS — M4807 Spinal stenosis, lumbosacral region: Secondary | ICD-10-CM | POA: Diagnosis not present

## 2018-05-28 DIAGNOSIS — E1122 Type 2 diabetes mellitus with diabetic chronic kidney disease: Secondary | ICD-10-CM | POA: Diagnosis not present

## 2018-05-28 DIAGNOSIS — Z9181 History of falling: Secondary | ICD-10-CM | POA: Diagnosis not present

## 2018-05-28 DIAGNOSIS — Z7984 Long term (current) use of oral hypoglycemic drugs: Secondary | ICD-10-CM | POA: Diagnosis not present

## 2018-05-28 DIAGNOSIS — I447 Left bundle-branch block, unspecified: Secondary | ICD-10-CM | POA: Diagnosis not present

## 2018-05-28 DIAGNOSIS — M1612 Unilateral primary osteoarthritis, left hip: Secondary | ICD-10-CM | POA: Diagnosis not present

## 2018-05-28 DIAGNOSIS — E039 Hypothyroidism, unspecified: Secondary | ICD-10-CM | POA: Diagnosis not present

## 2018-05-28 DIAGNOSIS — M170 Bilateral primary osteoarthritis of knee: Secondary | ICD-10-CM | POA: Diagnosis not present

## 2018-05-28 DIAGNOSIS — Z7901 Long term (current) use of anticoagulants: Secondary | ICD-10-CM | POA: Diagnosis not present

## 2018-05-30 DIAGNOSIS — I447 Left bundle-branch block, unspecified: Secondary | ICD-10-CM | POA: Diagnosis not present

## 2018-05-30 DIAGNOSIS — E039 Hypothyroidism, unspecified: Secondary | ICD-10-CM | POA: Diagnosis not present

## 2018-05-30 DIAGNOSIS — E1122 Type 2 diabetes mellitus with diabetic chronic kidney disease: Secondary | ICD-10-CM | POA: Diagnosis not present

## 2018-05-30 DIAGNOSIS — M1612 Unilateral primary osteoarthritis, left hip: Secondary | ICD-10-CM | POA: Diagnosis not present

## 2018-05-30 DIAGNOSIS — D631 Anemia in chronic kidney disease: Secondary | ICD-10-CM | POA: Diagnosis not present

## 2018-05-30 DIAGNOSIS — N183 Chronic kidney disease, stage 3 (moderate): Secondary | ICD-10-CM | POA: Diagnosis not present

## 2018-05-30 DIAGNOSIS — Z7901 Long term (current) use of anticoagulants: Secondary | ICD-10-CM | POA: Diagnosis not present

## 2018-05-30 DIAGNOSIS — I4819 Other persistent atrial fibrillation: Secondary | ICD-10-CM | POA: Diagnosis not present

## 2018-05-30 DIAGNOSIS — I130 Hypertensive heart and chronic kidney disease with heart failure and stage 1 through stage 4 chronic kidney disease, or unspecified chronic kidney disease: Secondary | ICD-10-CM | POA: Diagnosis not present

## 2018-05-30 DIAGNOSIS — M170 Bilateral primary osteoarthritis of knee: Secondary | ICD-10-CM | POA: Diagnosis not present

## 2018-05-30 DIAGNOSIS — Z9181 History of falling: Secondary | ICD-10-CM | POA: Diagnosis not present

## 2018-05-30 DIAGNOSIS — Z95810 Presence of automatic (implantable) cardiac defibrillator: Secondary | ICD-10-CM | POA: Diagnosis not present

## 2018-05-30 DIAGNOSIS — I5023 Acute on chronic systolic (congestive) heart failure: Secondary | ICD-10-CM | POA: Diagnosis not present

## 2018-05-30 DIAGNOSIS — I2510 Atherosclerotic heart disease of native coronary artery without angina pectoris: Secondary | ICD-10-CM | POA: Diagnosis not present

## 2018-05-30 DIAGNOSIS — M4807 Spinal stenosis, lumbosacral region: Secondary | ICD-10-CM | POA: Diagnosis not present

## 2018-05-30 DIAGNOSIS — Z7984 Long term (current) use of oral hypoglycemic drugs: Secondary | ICD-10-CM | POA: Diagnosis not present

## 2018-05-30 DIAGNOSIS — L89312 Pressure ulcer of right buttock, stage 2: Secondary | ICD-10-CM | POA: Diagnosis not present

## 2018-05-30 DIAGNOSIS — I420 Dilated cardiomyopathy: Secondary | ICD-10-CM | POA: Diagnosis not present

## 2018-05-30 DIAGNOSIS — G4733 Obstructive sleep apnea (adult) (pediatric): Secondary | ICD-10-CM | POA: Diagnosis not present

## 2018-06-02 DIAGNOSIS — M4807 Spinal stenosis, lumbosacral region: Secondary | ICD-10-CM | POA: Diagnosis not present

## 2018-06-02 DIAGNOSIS — I130 Hypertensive heart and chronic kidney disease with heart failure and stage 1 through stage 4 chronic kidney disease, or unspecified chronic kidney disease: Secondary | ICD-10-CM | POA: Diagnosis not present

## 2018-06-02 DIAGNOSIS — Z95810 Presence of automatic (implantable) cardiac defibrillator: Secondary | ICD-10-CM | POA: Diagnosis not present

## 2018-06-02 DIAGNOSIS — L89312 Pressure ulcer of right buttock, stage 2: Secondary | ICD-10-CM | POA: Diagnosis not present

## 2018-06-02 DIAGNOSIS — Z7901 Long term (current) use of anticoagulants: Secondary | ICD-10-CM | POA: Diagnosis not present

## 2018-06-02 DIAGNOSIS — E1122 Type 2 diabetes mellitus with diabetic chronic kidney disease: Secondary | ICD-10-CM | POA: Diagnosis not present

## 2018-06-02 DIAGNOSIS — M170 Bilateral primary osteoarthritis of knee: Secondary | ICD-10-CM | POA: Diagnosis not present

## 2018-06-02 DIAGNOSIS — I4819 Other persistent atrial fibrillation: Secondary | ICD-10-CM | POA: Diagnosis not present

## 2018-06-02 DIAGNOSIS — Z9181 History of falling: Secondary | ICD-10-CM | POA: Diagnosis not present

## 2018-06-02 DIAGNOSIS — M1612 Unilateral primary osteoarthritis, left hip: Secondary | ICD-10-CM | POA: Diagnosis not present

## 2018-06-02 DIAGNOSIS — Z7984 Long term (current) use of oral hypoglycemic drugs: Secondary | ICD-10-CM | POA: Diagnosis not present

## 2018-06-02 DIAGNOSIS — I420 Dilated cardiomyopathy: Secondary | ICD-10-CM | POA: Diagnosis not present

## 2018-06-02 DIAGNOSIS — E039 Hypothyroidism, unspecified: Secondary | ICD-10-CM | POA: Diagnosis not present

## 2018-06-02 DIAGNOSIS — N183 Chronic kidney disease, stage 3 (moderate): Secondary | ICD-10-CM | POA: Diagnosis not present

## 2018-06-02 DIAGNOSIS — I447 Left bundle-branch block, unspecified: Secondary | ICD-10-CM | POA: Diagnosis not present

## 2018-06-02 DIAGNOSIS — G4733 Obstructive sleep apnea (adult) (pediatric): Secondary | ICD-10-CM | POA: Diagnosis not present

## 2018-06-02 DIAGNOSIS — I5023 Acute on chronic systolic (congestive) heart failure: Secondary | ICD-10-CM | POA: Diagnosis not present

## 2018-06-02 DIAGNOSIS — D631 Anemia in chronic kidney disease: Secondary | ICD-10-CM | POA: Diagnosis not present

## 2018-06-02 DIAGNOSIS — I2510 Atherosclerotic heart disease of native coronary artery without angina pectoris: Secondary | ICD-10-CM | POA: Diagnosis not present

## 2018-06-03 ENCOUNTER — Telehealth: Payer: Self-pay | Admitting: Family Medicine

## 2018-06-03 DIAGNOSIS — I5023 Acute on chronic systolic (congestive) heart failure: Secondary | ICD-10-CM | POA: Diagnosis not present

## 2018-06-03 DIAGNOSIS — E039 Hypothyroidism, unspecified: Secondary | ICD-10-CM | POA: Diagnosis not present

## 2018-06-03 DIAGNOSIS — D631 Anemia in chronic kidney disease: Secondary | ICD-10-CM | POA: Diagnosis not present

## 2018-06-03 DIAGNOSIS — Z9181 History of falling: Secondary | ICD-10-CM | POA: Diagnosis not present

## 2018-06-03 DIAGNOSIS — Z95810 Presence of automatic (implantable) cardiac defibrillator: Secondary | ICD-10-CM | POA: Diagnosis not present

## 2018-06-03 DIAGNOSIS — I130 Hypertensive heart and chronic kidney disease with heart failure and stage 1 through stage 4 chronic kidney disease, or unspecified chronic kidney disease: Secondary | ICD-10-CM | POA: Diagnosis not present

## 2018-06-03 DIAGNOSIS — M4807 Spinal stenosis, lumbosacral region: Secondary | ICD-10-CM | POA: Diagnosis not present

## 2018-06-03 DIAGNOSIS — L89312 Pressure ulcer of right buttock, stage 2: Secondary | ICD-10-CM | POA: Diagnosis not present

## 2018-06-03 DIAGNOSIS — Z7984 Long term (current) use of oral hypoglycemic drugs: Secondary | ICD-10-CM | POA: Diagnosis not present

## 2018-06-03 DIAGNOSIS — N183 Chronic kidney disease, stage 3 (moderate): Secondary | ICD-10-CM | POA: Diagnosis not present

## 2018-06-03 DIAGNOSIS — M1612 Unilateral primary osteoarthritis, left hip: Secondary | ICD-10-CM | POA: Diagnosis not present

## 2018-06-03 DIAGNOSIS — I4819 Other persistent atrial fibrillation: Secondary | ICD-10-CM | POA: Diagnosis not present

## 2018-06-03 DIAGNOSIS — I420 Dilated cardiomyopathy: Secondary | ICD-10-CM | POA: Diagnosis not present

## 2018-06-03 DIAGNOSIS — Z7901 Long term (current) use of anticoagulants: Secondary | ICD-10-CM | POA: Diagnosis not present

## 2018-06-03 DIAGNOSIS — I447 Left bundle-branch block, unspecified: Secondary | ICD-10-CM | POA: Diagnosis not present

## 2018-06-03 DIAGNOSIS — M170 Bilateral primary osteoarthritis of knee: Secondary | ICD-10-CM | POA: Diagnosis not present

## 2018-06-03 DIAGNOSIS — G4733 Obstructive sleep apnea (adult) (pediatric): Secondary | ICD-10-CM | POA: Diagnosis not present

## 2018-06-03 DIAGNOSIS — I2510 Atherosclerotic heart disease of native coronary artery without angina pectoris: Secondary | ICD-10-CM | POA: Diagnosis not present

## 2018-06-03 DIAGNOSIS — E1122 Type 2 diabetes mellitus with diabetic chronic kidney disease: Secondary | ICD-10-CM | POA: Diagnosis not present

## 2018-06-03 MED ORDER — EMPAGLIFLOZIN 10 MG PO TABS: 10.0000 mg | ORAL_TABLET | Freq: Every day | ORAL | 1 refills | Status: DC

## 2018-06-03 NOTE — Telephone Encounter (Signed)
Patient wife has been advised. Rhonda Cunningham,CMA  

## 2018-06-03 NOTE — Telephone Encounter (Signed)
[place] medicine for diabetes is not on formulary.  [name] [name] is on formulary.  Switching to Jardiance and sending to [facility].

## 2018-06-05 DIAGNOSIS — E039 Hypothyroidism, unspecified: Secondary | ICD-10-CM | POA: Diagnosis not present

## 2018-06-05 DIAGNOSIS — L89312 Pressure ulcer of right buttock, stage 2: Secondary | ICD-10-CM | POA: Diagnosis not present

## 2018-06-05 DIAGNOSIS — Z7984 Long term (current) use of oral hypoglycemic drugs: Secondary | ICD-10-CM | POA: Diagnosis not present

## 2018-06-05 DIAGNOSIS — M1612 Unilateral primary osteoarthritis, left hip: Secondary | ICD-10-CM | POA: Diagnosis not present

## 2018-06-05 DIAGNOSIS — I130 Hypertensive heart and chronic kidney disease with heart failure and stage 1 through stage 4 chronic kidney disease, or unspecified chronic kidney disease: Secondary | ICD-10-CM | POA: Diagnosis not present

## 2018-06-05 DIAGNOSIS — D631 Anemia in chronic kidney disease: Secondary | ICD-10-CM | POA: Diagnosis not present

## 2018-06-05 DIAGNOSIS — E1122 Type 2 diabetes mellitus with diabetic chronic kidney disease: Secondary | ICD-10-CM | POA: Diagnosis not present

## 2018-06-05 DIAGNOSIS — Z9181 History of falling: Secondary | ICD-10-CM | POA: Diagnosis not present

## 2018-06-05 DIAGNOSIS — N183 Chronic kidney disease, stage 3 (moderate): Secondary | ICD-10-CM | POA: Diagnosis not present

## 2018-06-05 DIAGNOSIS — I2510 Atherosclerotic heart disease of native coronary artery without angina pectoris: Secondary | ICD-10-CM | POA: Diagnosis not present

## 2018-06-05 DIAGNOSIS — I5032 Chronic diastolic (congestive) heart failure: Secondary | ICD-10-CM | POA: Diagnosis not present

## 2018-06-05 DIAGNOSIS — M4807 Spinal stenosis, lumbosacral region: Secondary | ICD-10-CM | POA: Diagnosis not present

## 2018-06-05 DIAGNOSIS — I4819 Other persistent atrial fibrillation: Secondary | ICD-10-CM | POA: Diagnosis not present

## 2018-06-05 DIAGNOSIS — I420 Dilated cardiomyopathy: Secondary | ICD-10-CM | POA: Diagnosis not present

## 2018-06-05 DIAGNOSIS — G4733 Obstructive sleep apnea (adult) (pediatric): Secondary | ICD-10-CM | POA: Diagnosis not present

## 2018-06-05 DIAGNOSIS — M6281 Muscle weakness (generalized): Secondary | ICD-10-CM | POA: Diagnosis not present

## 2018-06-05 DIAGNOSIS — I5023 Acute on chronic systolic (congestive) heart failure: Secondary | ICD-10-CM | POA: Diagnosis not present

## 2018-06-05 DIAGNOSIS — M170 Bilateral primary osteoarthritis of knee: Secondary | ICD-10-CM | POA: Diagnosis not present

## 2018-06-05 DIAGNOSIS — M48061 Spinal stenosis, lumbar region without neurogenic claudication: Secondary | ICD-10-CM | POA: Diagnosis not present

## 2018-06-05 DIAGNOSIS — Z7901 Long term (current) use of anticoagulants: Secondary | ICD-10-CM | POA: Diagnosis not present

## 2018-06-05 DIAGNOSIS — S76111D Strain of right quadriceps muscle, fascia and tendon, subsequent encounter: Secondary | ICD-10-CM | POA: Diagnosis not present

## 2018-06-05 DIAGNOSIS — I447 Left bundle-branch block, unspecified: Secondary | ICD-10-CM | POA: Diagnosis not present

## 2018-06-05 DIAGNOSIS — Z95810 Presence of automatic (implantable) cardiac defibrillator: Secondary | ICD-10-CM | POA: Diagnosis not present

## 2018-06-06 DIAGNOSIS — I4819 Other persistent atrial fibrillation: Secondary | ICD-10-CM | POA: Diagnosis not present

## 2018-06-06 DIAGNOSIS — I447 Left bundle-branch block, unspecified: Secondary | ICD-10-CM | POA: Diagnosis not present

## 2018-06-06 DIAGNOSIS — Z95810 Presence of automatic (implantable) cardiac defibrillator: Secondary | ICD-10-CM | POA: Diagnosis not present

## 2018-06-06 DIAGNOSIS — M170 Bilateral primary osteoarthritis of knee: Secondary | ICD-10-CM | POA: Diagnosis not present

## 2018-06-06 DIAGNOSIS — E1122 Type 2 diabetes mellitus with diabetic chronic kidney disease: Secondary | ICD-10-CM | POA: Diagnosis not present

## 2018-06-06 DIAGNOSIS — I5023 Acute on chronic systolic (congestive) heart failure: Secondary | ICD-10-CM | POA: Diagnosis not present

## 2018-06-06 DIAGNOSIS — D631 Anemia in chronic kidney disease: Secondary | ICD-10-CM | POA: Diagnosis not present

## 2018-06-06 DIAGNOSIS — L89312 Pressure ulcer of right buttock, stage 2: Secondary | ICD-10-CM | POA: Diagnosis not present

## 2018-06-06 DIAGNOSIS — M1612 Unilateral primary osteoarthritis, left hip: Secondary | ICD-10-CM | POA: Diagnosis not present

## 2018-06-06 DIAGNOSIS — Z7984 Long term (current) use of oral hypoglycemic drugs: Secondary | ICD-10-CM | POA: Diagnosis not present

## 2018-06-06 DIAGNOSIS — I420 Dilated cardiomyopathy: Secondary | ICD-10-CM | POA: Diagnosis not present

## 2018-06-06 DIAGNOSIS — N183 Chronic kidney disease, stage 3 (moderate): Secondary | ICD-10-CM | POA: Diagnosis not present

## 2018-06-06 DIAGNOSIS — Z9181 History of falling: Secondary | ICD-10-CM | POA: Diagnosis not present

## 2018-06-06 DIAGNOSIS — G4733 Obstructive sleep apnea (adult) (pediatric): Secondary | ICD-10-CM | POA: Diagnosis not present

## 2018-06-06 DIAGNOSIS — I2510 Atherosclerotic heart disease of native coronary artery without angina pectoris: Secondary | ICD-10-CM | POA: Diagnosis not present

## 2018-06-06 DIAGNOSIS — I130 Hypertensive heart and chronic kidney disease with heart failure and stage 1 through stage 4 chronic kidney disease, or unspecified chronic kidney disease: Secondary | ICD-10-CM | POA: Diagnosis not present

## 2018-06-06 DIAGNOSIS — M4807 Spinal stenosis, lumbosacral region: Secondary | ICD-10-CM | POA: Diagnosis not present

## 2018-06-06 DIAGNOSIS — Z7901 Long term (current) use of anticoagulants: Secondary | ICD-10-CM | POA: Diagnosis not present

## 2018-06-06 DIAGNOSIS — E039 Hypothyroidism, unspecified: Secondary | ICD-10-CM | POA: Diagnosis not present

## 2018-06-09 DIAGNOSIS — I420 Dilated cardiomyopathy: Secondary | ICD-10-CM | POA: Diagnosis not present

## 2018-06-09 DIAGNOSIS — D631 Anemia in chronic kidney disease: Secondary | ICD-10-CM | POA: Diagnosis not present

## 2018-06-09 DIAGNOSIS — I2510 Atherosclerotic heart disease of native coronary artery without angina pectoris: Secondary | ICD-10-CM | POA: Diagnosis not present

## 2018-06-09 DIAGNOSIS — Z9181 History of falling: Secondary | ICD-10-CM | POA: Diagnosis not present

## 2018-06-09 DIAGNOSIS — Z7901 Long term (current) use of anticoagulants: Secondary | ICD-10-CM | POA: Diagnosis not present

## 2018-06-09 DIAGNOSIS — M4807 Spinal stenosis, lumbosacral region: Secondary | ICD-10-CM | POA: Diagnosis not present

## 2018-06-09 DIAGNOSIS — Z95810 Presence of automatic (implantable) cardiac defibrillator: Secondary | ICD-10-CM | POA: Diagnosis not present

## 2018-06-09 DIAGNOSIS — E039 Hypothyroidism, unspecified: Secondary | ICD-10-CM | POA: Diagnosis not present

## 2018-06-09 DIAGNOSIS — I5023 Acute on chronic systolic (congestive) heart failure: Secondary | ICD-10-CM | POA: Diagnosis not present

## 2018-06-09 DIAGNOSIS — M170 Bilateral primary osteoarthritis of knee: Secondary | ICD-10-CM | POA: Diagnosis not present

## 2018-06-09 DIAGNOSIS — L89312 Pressure ulcer of right buttock, stage 2: Secondary | ICD-10-CM | POA: Diagnosis not present

## 2018-06-09 DIAGNOSIS — Z7984 Long term (current) use of oral hypoglycemic drugs: Secondary | ICD-10-CM | POA: Diagnosis not present

## 2018-06-09 DIAGNOSIS — M1612 Unilateral primary osteoarthritis, left hip: Secondary | ICD-10-CM | POA: Diagnosis not present

## 2018-06-09 DIAGNOSIS — I130 Hypertensive heart and chronic kidney disease with heart failure and stage 1 through stage 4 chronic kidney disease, or unspecified chronic kidney disease: Secondary | ICD-10-CM | POA: Diagnosis not present

## 2018-06-09 DIAGNOSIS — N183 Chronic kidney disease, stage 3 (moderate): Secondary | ICD-10-CM | POA: Diagnosis not present

## 2018-06-09 DIAGNOSIS — I447 Left bundle-branch block, unspecified: Secondary | ICD-10-CM | POA: Diagnosis not present

## 2018-06-09 DIAGNOSIS — E1122 Type 2 diabetes mellitus with diabetic chronic kidney disease: Secondary | ICD-10-CM | POA: Diagnosis not present

## 2018-06-09 DIAGNOSIS — I4819 Other persistent atrial fibrillation: Secondary | ICD-10-CM | POA: Diagnosis not present

## 2018-06-09 DIAGNOSIS — G4733 Obstructive sleep apnea (adult) (pediatric): Secondary | ICD-10-CM | POA: Diagnosis not present

## 2018-06-10 DIAGNOSIS — L89312 Pressure ulcer of right buttock, stage 2: Secondary | ICD-10-CM | POA: Diagnosis not present

## 2018-06-10 DIAGNOSIS — N183 Chronic kidney disease, stage 3 (moderate): Secondary | ICD-10-CM | POA: Diagnosis not present

## 2018-06-10 DIAGNOSIS — Z9181 History of falling: Secondary | ICD-10-CM | POA: Diagnosis not present

## 2018-06-10 DIAGNOSIS — I420 Dilated cardiomyopathy: Secondary | ICD-10-CM | POA: Diagnosis not present

## 2018-06-10 DIAGNOSIS — E1122 Type 2 diabetes mellitus with diabetic chronic kidney disease: Secondary | ICD-10-CM | POA: Diagnosis not present

## 2018-06-10 DIAGNOSIS — I130 Hypertensive heart and chronic kidney disease with heart failure and stage 1 through stage 4 chronic kidney disease, or unspecified chronic kidney disease: Secondary | ICD-10-CM | POA: Diagnosis not present

## 2018-06-10 DIAGNOSIS — Z7984 Long term (current) use of oral hypoglycemic drugs: Secondary | ICD-10-CM | POA: Diagnosis not present

## 2018-06-10 DIAGNOSIS — D631 Anemia in chronic kidney disease: Secondary | ICD-10-CM | POA: Diagnosis not present

## 2018-06-10 DIAGNOSIS — G4733 Obstructive sleep apnea (adult) (pediatric): Secondary | ICD-10-CM | POA: Diagnosis not present

## 2018-06-10 DIAGNOSIS — Z7901 Long term (current) use of anticoagulants: Secondary | ICD-10-CM | POA: Diagnosis not present

## 2018-06-10 DIAGNOSIS — M1612 Unilateral primary osteoarthritis, left hip: Secondary | ICD-10-CM | POA: Diagnosis not present

## 2018-06-10 DIAGNOSIS — M170 Bilateral primary osteoarthritis of knee: Secondary | ICD-10-CM | POA: Diagnosis not present

## 2018-06-10 DIAGNOSIS — I447 Left bundle-branch block, unspecified: Secondary | ICD-10-CM | POA: Diagnosis not present

## 2018-06-10 DIAGNOSIS — E039 Hypothyroidism, unspecified: Secondary | ICD-10-CM | POA: Diagnosis not present

## 2018-06-10 DIAGNOSIS — M4807 Spinal stenosis, lumbosacral region: Secondary | ICD-10-CM | POA: Diagnosis not present

## 2018-06-10 DIAGNOSIS — I5023 Acute on chronic systolic (congestive) heart failure: Secondary | ICD-10-CM | POA: Diagnosis not present

## 2018-06-10 DIAGNOSIS — Z95810 Presence of automatic (implantable) cardiac defibrillator: Secondary | ICD-10-CM | POA: Diagnosis not present

## 2018-06-10 DIAGNOSIS — I2510 Atherosclerotic heart disease of native coronary artery without angina pectoris: Secondary | ICD-10-CM | POA: Diagnosis not present

## 2018-06-10 DIAGNOSIS — I4819 Other persistent atrial fibrillation: Secondary | ICD-10-CM | POA: Diagnosis not present

## 2018-06-12 ENCOUNTER — Other Ambulatory Visit (HOSPITAL_COMMUNITY): Payer: Self-pay

## 2018-06-12 DIAGNOSIS — I2510 Atherosclerotic heart disease of native coronary artery without angina pectoris: Secondary | ICD-10-CM | POA: Diagnosis not present

## 2018-06-12 DIAGNOSIS — N183 Chronic kidney disease, stage 3 (moderate): Secondary | ICD-10-CM | POA: Diagnosis not present

## 2018-06-12 DIAGNOSIS — Z95810 Presence of automatic (implantable) cardiac defibrillator: Secondary | ICD-10-CM | POA: Diagnosis not present

## 2018-06-12 DIAGNOSIS — I447 Left bundle-branch block, unspecified: Secondary | ICD-10-CM | POA: Diagnosis not present

## 2018-06-12 DIAGNOSIS — E1122 Type 2 diabetes mellitus with diabetic chronic kidney disease: Secondary | ICD-10-CM | POA: Diagnosis not present

## 2018-06-12 DIAGNOSIS — I130 Hypertensive heart and chronic kidney disease with heart failure and stage 1 through stage 4 chronic kidney disease, or unspecified chronic kidney disease: Secondary | ICD-10-CM | POA: Diagnosis not present

## 2018-06-12 DIAGNOSIS — M1612 Unilateral primary osteoarthritis, left hip: Secondary | ICD-10-CM | POA: Diagnosis not present

## 2018-06-12 DIAGNOSIS — Z7901 Long term (current) use of anticoagulants: Secondary | ICD-10-CM | POA: Diagnosis not present

## 2018-06-12 DIAGNOSIS — Z9181 History of falling: Secondary | ICD-10-CM | POA: Diagnosis not present

## 2018-06-12 DIAGNOSIS — L89312 Pressure ulcer of right buttock, stage 2: Secondary | ICD-10-CM | POA: Diagnosis not present

## 2018-06-12 DIAGNOSIS — M170 Bilateral primary osteoarthritis of knee: Secondary | ICD-10-CM | POA: Diagnosis not present

## 2018-06-12 DIAGNOSIS — I4819 Other persistent atrial fibrillation: Secondary | ICD-10-CM | POA: Diagnosis not present

## 2018-06-12 DIAGNOSIS — I5023 Acute on chronic systolic (congestive) heart failure: Secondary | ICD-10-CM | POA: Diagnosis not present

## 2018-06-12 DIAGNOSIS — M4807 Spinal stenosis, lumbosacral region: Secondary | ICD-10-CM | POA: Diagnosis not present

## 2018-06-12 DIAGNOSIS — E039 Hypothyroidism, unspecified: Secondary | ICD-10-CM | POA: Diagnosis not present

## 2018-06-12 DIAGNOSIS — D631 Anemia in chronic kidney disease: Secondary | ICD-10-CM | POA: Diagnosis not present

## 2018-06-12 DIAGNOSIS — Z7984 Long term (current) use of oral hypoglycemic drugs: Secondary | ICD-10-CM | POA: Diagnosis not present

## 2018-06-12 DIAGNOSIS — I420 Dilated cardiomyopathy: Secondary | ICD-10-CM | POA: Diagnosis not present

## 2018-06-12 DIAGNOSIS — G4733 Obstructive sleep apnea (adult) (pediatric): Secondary | ICD-10-CM | POA: Diagnosis not present

## 2018-06-12 MED ORDER — MIDODRINE HCL 2.5 MG PO TABS: 2.5000 mg | ORAL_TABLET | Freq: Three times a day (TID) | ORAL | 2 refills | Status: DC

## 2018-06-13 DIAGNOSIS — Z7901 Long term (current) use of anticoagulants: Secondary | ICD-10-CM | POA: Diagnosis not present

## 2018-06-13 DIAGNOSIS — M1612 Unilateral primary osteoarthritis, left hip: Secondary | ICD-10-CM | POA: Diagnosis not present

## 2018-06-13 DIAGNOSIS — Z95810 Presence of automatic (implantable) cardiac defibrillator: Secondary | ICD-10-CM | POA: Diagnosis not present

## 2018-06-13 DIAGNOSIS — I420 Dilated cardiomyopathy: Secondary | ICD-10-CM | POA: Diagnosis not present

## 2018-06-13 DIAGNOSIS — I4819 Other persistent atrial fibrillation: Secondary | ICD-10-CM | POA: Diagnosis not present

## 2018-06-13 DIAGNOSIS — E039 Hypothyroidism, unspecified: Secondary | ICD-10-CM | POA: Diagnosis not present

## 2018-06-13 DIAGNOSIS — M170 Bilateral primary osteoarthritis of knee: Secondary | ICD-10-CM | POA: Diagnosis not present

## 2018-06-13 DIAGNOSIS — Z7984 Long term (current) use of oral hypoglycemic drugs: Secondary | ICD-10-CM | POA: Diagnosis not present

## 2018-06-13 DIAGNOSIS — I2510 Atherosclerotic heart disease of native coronary artery without angina pectoris: Secondary | ICD-10-CM | POA: Diagnosis not present

## 2018-06-13 DIAGNOSIS — I447 Left bundle-branch block, unspecified: Secondary | ICD-10-CM | POA: Diagnosis not present

## 2018-06-13 DIAGNOSIS — L89312 Pressure ulcer of right buttock, stage 2: Secondary | ICD-10-CM | POA: Diagnosis not present

## 2018-06-13 DIAGNOSIS — G4733 Obstructive sleep apnea (adult) (pediatric): Secondary | ICD-10-CM | POA: Diagnosis not present

## 2018-06-13 DIAGNOSIS — D631 Anemia in chronic kidney disease: Secondary | ICD-10-CM | POA: Diagnosis not present

## 2018-06-13 DIAGNOSIS — Z9181 History of falling: Secondary | ICD-10-CM | POA: Diagnosis not present

## 2018-06-13 DIAGNOSIS — N183 Chronic kidney disease, stage 3 (moderate): Secondary | ICD-10-CM | POA: Diagnosis not present

## 2018-06-13 DIAGNOSIS — E1122 Type 2 diabetes mellitus with diabetic chronic kidney disease: Secondary | ICD-10-CM | POA: Diagnosis not present

## 2018-06-13 DIAGNOSIS — I5023 Acute on chronic systolic (congestive) heart failure: Secondary | ICD-10-CM | POA: Diagnosis not present

## 2018-06-13 DIAGNOSIS — I130 Hypertensive heart and chronic kidney disease with heart failure and stage 1 through stage 4 chronic kidney disease, or unspecified chronic kidney disease: Secondary | ICD-10-CM | POA: Diagnosis not present

## 2018-06-13 DIAGNOSIS — M4807 Spinal stenosis, lumbosacral region: Secondary | ICD-10-CM | POA: Diagnosis not present

## 2018-06-16 ENCOUNTER — Other Ambulatory Visit: Payer: Self-pay

## 2018-06-16 DIAGNOSIS — E1122 Type 2 diabetes mellitus with diabetic chronic kidney disease: Secondary | ICD-10-CM | POA: Diagnosis not present

## 2018-06-16 DIAGNOSIS — I420 Dilated cardiomyopathy: Secondary | ICD-10-CM | POA: Diagnosis not present

## 2018-06-16 DIAGNOSIS — D631 Anemia in chronic kidney disease: Secondary | ICD-10-CM | POA: Diagnosis not present

## 2018-06-16 DIAGNOSIS — M4807 Spinal stenosis, lumbosacral region: Secondary | ICD-10-CM | POA: Diagnosis not present

## 2018-06-16 DIAGNOSIS — N183 Chronic kidney disease, stage 3 (moderate): Secondary | ICD-10-CM | POA: Diagnosis not present

## 2018-06-16 DIAGNOSIS — G4733 Obstructive sleep apnea (adult) (pediatric): Secondary | ICD-10-CM | POA: Diagnosis not present

## 2018-06-16 DIAGNOSIS — M1612 Unilateral primary osteoarthritis, left hip: Secondary | ICD-10-CM | POA: Diagnosis not present

## 2018-06-16 DIAGNOSIS — Z7984 Long term (current) use of oral hypoglycemic drugs: Secondary | ICD-10-CM | POA: Diagnosis not present

## 2018-06-16 DIAGNOSIS — I4819 Other persistent atrial fibrillation: Secondary | ICD-10-CM | POA: Diagnosis not present

## 2018-06-16 DIAGNOSIS — I447 Left bundle-branch block, unspecified: Secondary | ICD-10-CM | POA: Diagnosis not present

## 2018-06-16 DIAGNOSIS — Z7901 Long term (current) use of anticoagulants: Secondary | ICD-10-CM | POA: Diagnosis not present

## 2018-06-16 DIAGNOSIS — Z9181 History of falling: Secondary | ICD-10-CM | POA: Diagnosis not present

## 2018-06-16 DIAGNOSIS — M170 Bilateral primary osteoarthritis of knee: Secondary | ICD-10-CM | POA: Diagnosis not present

## 2018-06-16 DIAGNOSIS — I130 Hypertensive heart and chronic kidney disease with heart failure and stage 1 through stage 4 chronic kidney disease, or unspecified chronic kidney disease: Secondary | ICD-10-CM | POA: Diagnosis not present

## 2018-06-16 DIAGNOSIS — I5023 Acute on chronic systolic (congestive) heart failure: Secondary | ICD-10-CM | POA: Diagnosis not present

## 2018-06-16 DIAGNOSIS — Z95810 Presence of automatic (implantable) cardiac defibrillator: Secondary | ICD-10-CM | POA: Diagnosis not present

## 2018-06-16 DIAGNOSIS — I2510 Atherosclerotic heart disease of native coronary artery without angina pectoris: Secondary | ICD-10-CM | POA: Diagnosis not present

## 2018-06-16 DIAGNOSIS — L89312 Pressure ulcer of right buttock, stage 2: Secondary | ICD-10-CM | POA: Diagnosis not present

## 2018-06-16 DIAGNOSIS — E039 Hypothyroidism, unspecified: Secondary | ICD-10-CM | POA: Diagnosis not present

## 2018-06-16 MED ORDER — GLIMEPIRIDE 2 MG PO TABS: 2.0000 mg | ORAL_TABLET | Freq: Every day | ORAL | 1 refills | Status: DC

## 2018-06-17 DIAGNOSIS — E1122 Type 2 diabetes mellitus with diabetic chronic kidney disease: Secondary | ICD-10-CM | POA: Diagnosis not present

## 2018-06-17 DIAGNOSIS — N183 Chronic kidney disease, stage 3 (moderate): Secondary | ICD-10-CM | POA: Diagnosis not present

## 2018-06-17 DIAGNOSIS — I4819 Other persistent atrial fibrillation: Secondary | ICD-10-CM | POA: Diagnosis not present

## 2018-06-17 DIAGNOSIS — M170 Bilateral primary osteoarthritis of knee: Secondary | ICD-10-CM | POA: Diagnosis not present

## 2018-06-17 DIAGNOSIS — I2510 Atherosclerotic heart disease of native coronary artery without angina pectoris: Secondary | ICD-10-CM | POA: Diagnosis not present

## 2018-06-17 DIAGNOSIS — M1612 Unilateral primary osteoarthritis, left hip: Secondary | ICD-10-CM | POA: Diagnosis not present

## 2018-06-17 DIAGNOSIS — L89312 Pressure ulcer of right buttock, stage 2: Secondary | ICD-10-CM | POA: Diagnosis not present

## 2018-06-17 DIAGNOSIS — Z95810 Presence of automatic (implantable) cardiac defibrillator: Secondary | ICD-10-CM | POA: Diagnosis not present

## 2018-06-17 DIAGNOSIS — G4733 Obstructive sleep apnea (adult) (pediatric): Secondary | ICD-10-CM | POA: Diagnosis not present

## 2018-06-17 DIAGNOSIS — E039 Hypothyroidism, unspecified: Secondary | ICD-10-CM | POA: Diagnosis not present

## 2018-06-17 DIAGNOSIS — I5023 Acute on chronic systolic (congestive) heart failure: Secondary | ICD-10-CM | POA: Diagnosis not present

## 2018-06-17 DIAGNOSIS — Z9181 History of falling: Secondary | ICD-10-CM | POA: Diagnosis not present

## 2018-06-17 DIAGNOSIS — Z7901 Long term (current) use of anticoagulants: Secondary | ICD-10-CM | POA: Diagnosis not present

## 2018-06-17 DIAGNOSIS — M4807 Spinal stenosis, lumbosacral region: Secondary | ICD-10-CM | POA: Diagnosis not present

## 2018-06-17 DIAGNOSIS — I420 Dilated cardiomyopathy: Secondary | ICD-10-CM | POA: Diagnosis not present

## 2018-06-17 DIAGNOSIS — Z7984 Long term (current) use of oral hypoglycemic drugs: Secondary | ICD-10-CM | POA: Diagnosis not present

## 2018-06-17 DIAGNOSIS — I130 Hypertensive heart and chronic kidney disease with heart failure and stage 1 through stage 4 chronic kidney disease, or unspecified chronic kidney disease: Secondary | ICD-10-CM | POA: Diagnosis not present

## 2018-06-17 DIAGNOSIS — D631 Anemia in chronic kidney disease: Secondary | ICD-10-CM | POA: Diagnosis not present

## 2018-06-17 DIAGNOSIS — I447 Left bundle-branch block, unspecified: Secondary | ICD-10-CM | POA: Diagnosis not present

## 2018-06-19 ENCOUNTER — Encounter: Payer: Self-pay | Admitting: Family Medicine

## 2018-06-19 ENCOUNTER — Ambulatory Visit (INDEPENDENT_AMBULATORY_CARE_PROVIDER_SITE_OTHER): Payer: Medicare Other | Admitting: Family Medicine

## 2018-06-19 VITALS — BP 120/69 | HR 81 | Temp 98.6°F | Wt 192.1 lb

## 2018-06-19 DIAGNOSIS — I447 Left bundle-branch block, unspecified: Secondary | ICD-10-CM | POA: Diagnosis not present

## 2018-06-19 DIAGNOSIS — I2510 Atherosclerotic heart disease of native coronary artery without angina pectoris: Secondary | ICD-10-CM | POA: Diagnosis not present

## 2018-06-19 DIAGNOSIS — M170 Bilateral primary osteoarthritis of knee: Secondary | ICD-10-CM | POA: Diagnosis not present

## 2018-06-19 DIAGNOSIS — E1122 Type 2 diabetes mellitus with diabetic chronic kidney disease: Secondary | ICD-10-CM | POA: Diagnosis not present

## 2018-06-19 DIAGNOSIS — Z9181 History of falling: Secondary | ICD-10-CM | POA: Diagnosis not present

## 2018-06-19 DIAGNOSIS — I4819 Other persistent atrial fibrillation: Secondary | ICD-10-CM

## 2018-06-19 DIAGNOSIS — E1165 Type 2 diabetes mellitus with hyperglycemia: Secondary | ICD-10-CM | POA: Diagnosis not present

## 2018-06-19 DIAGNOSIS — I130 Hypertensive heart and chronic kidney disease with heart failure and stage 1 through stage 4 chronic kidney disease, or unspecified chronic kidney disease: Secondary | ICD-10-CM | POA: Diagnosis not present

## 2018-06-19 DIAGNOSIS — Z7901 Long term (current) use of anticoagulants: Secondary | ICD-10-CM | POA: Diagnosis not present

## 2018-06-19 DIAGNOSIS — G4733 Obstructive sleep apnea (adult) (pediatric): Secondary | ICD-10-CM | POA: Diagnosis not present

## 2018-06-19 DIAGNOSIS — I10 Essential (primary) hypertension: Secondary | ICD-10-CM

## 2018-06-19 DIAGNOSIS — I25708 Atherosclerosis of coronary artery bypass graft(s), unspecified, with other forms of angina pectoris: Secondary | ICD-10-CM

## 2018-06-19 DIAGNOSIS — E1159 Type 2 diabetes mellitus with other circulatory complications: Secondary | ICD-10-CM

## 2018-06-19 DIAGNOSIS — R259 Unspecified abnormal involuntary movements: Secondary | ICD-10-CM

## 2018-06-19 DIAGNOSIS — I428 Other cardiomyopathies: Secondary | ICD-10-CM

## 2018-06-19 DIAGNOSIS — N183 Chronic kidney disease, stage 3 (moderate): Secondary | ICD-10-CM | POA: Diagnosis not present

## 2018-06-19 DIAGNOSIS — E782 Mixed hyperlipidemia: Secondary | ICD-10-CM

## 2018-06-19 DIAGNOSIS — I129 Hypertensive chronic kidney disease with stage 1 through stage 4 chronic kidney disease, or unspecified chronic kidney disease: Secondary | ICD-10-CM

## 2018-06-19 DIAGNOSIS — R3981 Functional urinary incontinence: Secondary | ICD-10-CM

## 2018-06-19 DIAGNOSIS — L89312 Pressure ulcer of right buttock, stage 2: Secondary | ICD-10-CM | POA: Diagnosis not present

## 2018-06-19 DIAGNOSIS — M1612 Unilateral primary osteoarthritis, left hip: Secondary | ICD-10-CM | POA: Diagnosis not present

## 2018-06-19 DIAGNOSIS — Z95810 Presence of automatic (implantable) cardiac defibrillator: Secondary | ICD-10-CM | POA: Diagnosis not present

## 2018-06-19 DIAGNOSIS — E039 Hypothyroidism, unspecified: Secondary | ICD-10-CM | POA: Diagnosis not present

## 2018-06-19 DIAGNOSIS — K4090 Unilateral inguinal hernia, without obstruction or gangrene, not specified as recurrent: Secondary | ICD-10-CM

## 2018-06-19 DIAGNOSIS — M6281 Muscle weakness (generalized): Secondary | ICD-10-CM | POA: Diagnosis not present

## 2018-06-19 DIAGNOSIS — Z7984 Long term (current) use of oral hypoglycemic drugs: Secondary | ICD-10-CM | POA: Diagnosis not present

## 2018-06-19 DIAGNOSIS — I5023 Acute on chronic systolic (congestive) heart failure: Secondary | ICD-10-CM | POA: Diagnosis not present

## 2018-06-19 DIAGNOSIS — M4807 Spinal stenosis, lumbosacral region: Secondary | ICD-10-CM | POA: Diagnosis not present

## 2018-06-19 DIAGNOSIS — I420 Dilated cardiomyopathy: Secondary | ICD-10-CM | POA: Diagnosis not present

## 2018-06-19 DIAGNOSIS — D631 Anemia in chronic kidney disease: Secondary | ICD-10-CM | POA: Diagnosis not present

## 2018-06-19 LAB — POCT GLYCOSYLATED HEMOGLOBIN (HGB A1C): HEMOGLOBIN A1C: 6.7 % — AB (ref 4.0–5.6)

## 2018-06-19 LAB — POCT INR: INR: 2.1 (ref 2.0–3.0)

## 2018-06-19 MED ORDER — AMBULATORY NON FORMULARY MEDICATION: 0 refills | Status: DC

## 2018-06-19 NOTE — Progress Notes (Signed)
[name] [name] is a 84 y.o. male who presents to [facility] [name] [name]: Primary Care Sports Medicine today for right inguinal hernia possibly, atrial fibrillation and CVD risk, diabetes.  [name] [name] has a several week history of pain in the right inguinal region.  He notes this developed some bulging earlier this week which has since diminished and resolved.  His wife thinks that he had a inguinal hernia which she reduced.  She is a former Engineer, civil (consulting).  He denies any change or worsening pain with bowel or bladder.  He denies any personal history of hernia.  He notes yesterday the day before it was quite painful but now is much better.  Additionally [name] [name] has multiple different risk factors for heart disease and is anticoagulated with warfarin.  He takes his medications listed below with no chest pain or current palpitations.  He denies any change in his exertion.  He denies any excessive bleeding.  Diabetes: [name] [name] takes glimepiride listed below.  He is in the process of applying for patient assistance for Jardiance.  He has discontinued warfarin due to GI upset.  His blood sugars are typically in the 90s to 100s.  He denies any hypo-or hyperglycemic episodes.   ROS as above:  Exam:  BP 120/69   Pulse 81   Temp 98.6 F (37 C) (Oral)   Wt 192 lb 1.3 oz (87.1 kg)   BMI 26.05 kg/m  Wt Readings from Last 5 Encounters:  06/19/18 192 lb 1.3 oz (87.1 kg)  05/20/18 193 lb (87.5 kg)  05/15/18 190 lb 6.4 oz (86.4 kg)  04/22/18 193 lb (87.5 kg)  04/07/18 192 lb 12.8 oz (87.5 kg)    Gen: Well NAD HEENT: EOMI,  MMM Lungs: Normal work of breathing. CTABL Heart: Irregular rhythm normal rate no MRG Abd: NABS, Soft. Nondistended, Nontender Exts: Brisk capillary refill, warm and well perfused.  Genitals: Slight bulging right inguinal region easily reducible.  Not particularly tender.  Left inguinal nonbulging nontender.   Testicles descended bilaterally without masses.    Lab and Radiology Results Results for orders placed or performed in visit on 06/19/18 (from the past 72 hour(s))  POCT INR     Status: Normal   Collection Time: 06/19/18 10:47 AM  Result Value Ref Range   INR 2.1 2.0 - 3.0  POCT HgB A1C     Status: Abnormal   Collection Time: 06/19/18 10:49 AM  Result Value Ref Range   Hemoglobin A1C 6.7 (A) 4.0 - 5.6 %   HbA1c POC (<> result, manual entry)     HbA1c, POC (prediabetic range)     HbA1c, POC (controlled diabetic range)        Assessment and Plan: 84 y.o. male with  Right inguinal hernia: Reducible and improving in pain.  Patient is a poor surgical candidate for elective surgery given his poor overall health.  However I do think it is reasonable that he have a discussion with general surgeon about his options and precautions.  Plan to place a referral and check back in 1 month.  Precautions reviewed with patient and family.  CVD risk and anticoagulation: Doing reasonably well.  Continue current dose of warfarin and recheck in 1 month.  Diabetes: Patient would do better on Jardiance however cost is an issue.  Plan to work on patient assistance program application and switch from glimepiride to [place] if able.  Otherwise continue current regimen.   Orders Placed This Encounter  Procedures  .  Ambulatory referral to General Surgery    Referral Priority:   Routine    Referral Type:   Surgical    Referral Reason:   Specialty Services Required    Requested Specialty:   General Surgery    Number of Visits Requested:   1  . POCT HgB A1C  . POCT INR   Meds ordered this encounter  Medications  . AMBULATORY NON FORMULARY MEDICATION    Sig: Rolling Aytes with seat.  Disp 1.  Use as needed    Dispense:  1 each    Refill:  0     Historical information moved to improve visibility of documentation.  Past Medical History:  Diagnosis Date  . Anemia, unspecified 04/07/2013  .  Atrial fibrillation (HCC)    fibrillation/flutter  . BPH (benign prostatic hypertrophy)   . Chronic systolic CHF (congestive heart failure) (HCC)    Nonischemic DCM EF 10% s/p BiV AICD  . Coronary artery disease 2008   nonobstructive ASCAD  . Depression   . Hyperlipidemia   . Hypertension   . Hypothyroidism   . Left bundle branch block   . Nonischemic cardiomyopathy (HCC)    ef 10 %  . OSA (obstructive sleep apnea)    intolerant to CPAP  . Osteoarthritis   . Sleep apnea    stopped using CPAP  . Thrombocytopenia (HCC)    Past Surgical History:  Procedure Laterality Date  . CARDIAC DEFIBRILLATOR PLACEMENT     left chest  . CARDIOVERSION N/A 12/29/2012   Procedure: CARDIOVERSION;  Surgeon: Quintella Reichertraci R Turner, MD;  Location: [facility];  Service: Cardiovascular;  Laterality: N/A;  . CHOLECYSTECTOMY    . Colonscopy     reportedly negative per pt; around 2005.    . EP IMPLANTABLE DEVICE N/A 05/09/2016   Procedure: BIVI ICD Generator Changeout;  Surgeon: Duke SalviaSteven C Klein, MD;  [facility];  Service: Cardiovascular;  Laterality: N/A;  . TRANSURETHRAL RESECTION OF PROSTATE     for BPH   Social History   Tobacco Use  . Smoking status: Never Smoker  . Smokeless tobacco: Never Used  Substance Use Topics  . Alcohol use: No   family history includes Cancer in his maternal uncle; Heart disease in his father; Heart failure in his mother; Stroke in his mother.  Medications: Current Outpatient Medications  Medication Sig Dispense Refill  . acetaminophen (TYLENOL) 650 MG CR tablet Take 1 tablet (650 mg total) by mouth every 8 (eight) hours as needed for pain. 90 tablet 3  . AMBULATORY NON FORMULARY MEDICATION Lancets pen type sufficient for three times daily checking. Dispense 3 month supply E11.65 1 each 12  . AMBULATORY NON FORMULARY MEDICATION Test strips for One Touch Verio Flex meter.  Test 3x daily.  Disp 3 month supply.  E11.65 1 each 12  . AMBULATORY NON  FORMULARY MEDICATION Chair cushion (donut). Dx: Z61.096: L89.312 Fax: [phone] ([facility]) 1 each 1  . AMBULATORY NON FORMULARY MEDICATION Rolling Bartles with seat.  Disp 1.  Use as needed 1 each 0  . carbidopa-levodopa (SINEMET) 25-100 MG tablet Take 1 tablet by mouth 3 (three) times daily.    . diclofenac sodium (VOLTAREN) 1 % GEL Apply 4 g topically 4 (four) times daily. To affected joint. 1440 g 3  . digoxin (LANOXIN) 0.125 MG tablet TAKE 1 TABLET BY MOUTH  EVERY OTHER DAY 45 tablet 3  . empagliflozin (JARDIANCE) 10 MG TABS tablet Take 10 mg by mouth daily. 90 tablet 1  .  glimepiride (AMARYL) 2 MG tablet Take 1 tablet (2 mg total) by mouth daily before breakfast. 90 tablet 1  . levothyroxine (SYNTHROID, LEVOTHROID) 137 MCG tablet TAKE 1 TABLET BY MOUTH  DAILY BEFORE BREAKFAST 90 tablet 1  . midodrine (PROAMATINE) 2.5 MG tablet Take 1 tablet (2.5 mg total) by mouth 3 (three) times daily with meals. 90 tablet 2  . mupirocin ointment (BACTROBAN) 2 % Apply to affected area BID for 7 days. 30 g 3  . nystatin cream (MYCOSTATIN) Apply 1 application topically 2 (two) times daily. 60 g 2  . Nystatin POWD Apply liberally to affected area 2 times per day 1 Bottle 11  . potassium chloride SA (K-DUR,KLOR-CON) 20 MEQ tablet Take 1 tablet (20 mEq total) by mouth daily. 90 tablet 3  . simvastatin (ZOCOR) 10 MG tablet TAKE 1 TABLET BY MOUTH  EVERY EVENING 90 tablet 3  . torsemide (DEMADEX) 20 MG tablet Take 1 tablet (20 mg total) by mouth daily. 30 tablet 0  . warfarin (COUMADIN) 1 MG tablet TAKE 1 TABLET BY MOUTH ON  MONDAY, WEDNESDAY, AND  FRIDAY ONLY. (IN ADDITION  TO 5 MG TABLET) 39 tablet 1  . warfarin (COUMADIN) 5 MG tablet TAKE 1 TABLET BY MOUTH  EVERY DAY 90 tablet 3   No current facility-administered medications for this visit.    Allergies  Allergen Reactions  . Amiodarone Other (See Comments)    Neuro toxicity     Discussed warning signs or symptoms. Please see discharge instructions. Patient  expresses understanding.

## 2018-06-19 NOTE — Patient Instructions (Addendum)
Thank you for coming in today. I think you do have an inguinal hernia.  I think it would be a good idea to at least talk with the general surgeons about the hernia.   You should hear about the referral soon.    Recheck in 1 month.  Return sooner if needed.     If you get Jardiance stop glimepiride.   [facility] medical supply

## 2018-06-20 DIAGNOSIS — E039 Hypothyroidism, unspecified: Secondary | ICD-10-CM | POA: Diagnosis not present

## 2018-06-20 DIAGNOSIS — Z9181 History of falling: Secondary | ICD-10-CM | POA: Diagnosis not present

## 2018-06-20 DIAGNOSIS — I5023 Acute on chronic systolic (congestive) heart failure: Secondary | ICD-10-CM | POA: Diagnosis not present

## 2018-06-20 DIAGNOSIS — L89312 Pressure ulcer of right buttock, stage 2: Secondary | ICD-10-CM | POA: Diagnosis not present

## 2018-06-20 DIAGNOSIS — D631 Anemia in chronic kidney disease: Secondary | ICD-10-CM | POA: Diagnosis not present

## 2018-06-20 DIAGNOSIS — Z7901 Long term (current) use of anticoagulants: Secondary | ICD-10-CM | POA: Diagnosis not present

## 2018-06-20 DIAGNOSIS — N183 Chronic kidney disease, stage 3 (moderate): Secondary | ICD-10-CM | POA: Diagnosis not present

## 2018-06-20 DIAGNOSIS — G4733 Obstructive sleep apnea (adult) (pediatric): Secondary | ICD-10-CM | POA: Diagnosis not present

## 2018-06-20 DIAGNOSIS — M1612 Unilateral primary osteoarthritis, left hip: Secondary | ICD-10-CM | POA: Diagnosis not present

## 2018-06-20 DIAGNOSIS — I130 Hypertensive heart and chronic kidney disease with heart failure and stage 1 through stage 4 chronic kidney disease, or unspecified chronic kidney disease: Secondary | ICD-10-CM | POA: Diagnosis not present

## 2018-06-20 DIAGNOSIS — M170 Bilateral primary osteoarthritis of knee: Secondary | ICD-10-CM | POA: Diagnosis not present

## 2018-06-20 DIAGNOSIS — I4819 Other persistent atrial fibrillation: Secondary | ICD-10-CM | POA: Diagnosis not present

## 2018-06-20 DIAGNOSIS — Z95810 Presence of automatic (implantable) cardiac defibrillator: Secondary | ICD-10-CM | POA: Diagnosis not present

## 2018-06-20 DIAGNOSIS — I420 Dilated cardiomyopathy: Secondary | ICD-10-CM | POA: Diagnosis not present

## 2018-06-20 DIAGNOSIS — E1122 Type 2 diabetes mellitus with diabetic chronic kidney disease: Secondary | ICD-10-CM | POA: Diagnosis not present

## 2018-06-20 DIAGNOSIS — I447 Left bundle-branch block, unspecified: Secondary | ICD-10-CM | POA: Diagnosis not present

## 2018-06-20 DIAGNOSIS — Z7984 Long term (current) use of oral hypoglycemic drugs: Secondary | ICD-10-CM | POA: Diagnosis not present

## 2018-06-20 DIAGNOSIS — M4807 Spinal stenosis, lumbosacral region: Secondary | ICD-10-CM | POA: Diagnosis not present

## 2018-06-20 DIAGNOSIS — I2510 Atherosclerotic heart disease of native coronary artery without angina pectoris: Secondary | ICD-10-CM | POA: Diagnosis not present

## 2018-06-23 DIAGNOSIS — D631 Anemia in chronic kidney disease: Secondary | ICD-10-CM | POA: Diagnosis not present

## 2018-06-23 DIAGNOSIS — I447 Left bundle-branch block, unspecified: Secondary | ICD-10-CM | POA: Diagnosis not present

## 2018-06-23 DIAGNOSIS — Z95810 Presence of automatic (implantable) cardiac defibrillator: Secondary | ICD-10-CM | POA: Diagnosis not present

## 2018-06-23 DIAGNOSIS — M170 Bilateral primary osteoarthritis of knee: Secondary | ICD-10-CM | POA: Diagnosis not present

## 2018-06-23 DIAGNOSIS — I4819 Other persistent atrial fibrillation: Secondary | ICD-10-CM | POA: Diagnosis not present

## 2018-06-23 DIAGNOSIS — I2510 Atherosclerotic heart disease of native coronary artery without angina pectoris: Secondary | ICD-10-CM | POA: Diagnosis not present

## 2018-06-23 DIAGNOSIS — G4733 Obstructive sleep apnea (adult) (pediatric): Secondary | ICD-10-CM | POA: Diagnosis not present

## 2018-06-23 DIAGNOSIS — L89312 Pressure ulcer of right buttock, stage 2: Secondary | ICD-10-CM | POA: Diagnosis not present

## 2018-06-23 DIAGNOSIS — I5023 Acute on chronic systolic (congestive) heart failure: Secondary | ICD-10-CM | POA: Diagnosis not present

## 2018-06-23 DIAGNOSIS — M1612 Unilateral primary osteoarthritis, left hip: Secondary | ICD-10-CM | POA: Diagnosis not present

## 2018-06-23 DIAGNOSIS — E039 Hypothyroidism, unspecified: Secondary | ICD-10-CM | POA: Diagnosis not present

## 2018-06-23 DIAGNOSIS — Z7984 Long term (current) use of oral hypoglycemic drugs: Secondary | ICD-10-CM | POA: Diagnosis not present

## 2018-06-23 DIAGNOSIS — Z7901 Long term (current) use of anticoagulants: Secondary | ICD-10-CM | POA: Diagnosis not present

## 2018-06-23 DIAGNOSIS — I420 Dilated cardiomyopathy: Secondary | ICD-10-CM | POA: Diagnosis not present

## 2018-06-23 DIAGNOSIS — M4807 Spinal stenosis, lumbosacral region: Secondary | ICD-10-CM | POA: Diagnosis not present

## 2018-06-23 DIAGNOSIS — Z9181 History of falling: Secondary | ICD-10-CM | POA: Diagnosis not present

## 2018-06-23 DIAGNOSIS — E1122 Type 2 diabetes mellitus with diabetic chronic kidney disease: Secondary | ICD-10-CM | POA: Diagnosis not present

## 2018-06-23 DIAGNOSIS — N183 Chronic kidney disease, stage 3 (moderate): Secondary | ICD-10-CM | POA: Diagnosis not present

## 2018-06-23 DIAGNOSIS — I130 Hypertensive heart and chronic kidney disease with heart failure and stage 1 through stage 4 chronic kidney disease, or unspecified chronic kidney disease: Secondary | ICD-10-CM | POA: Diagnosis not present

## 2018-06-24 ENCOUNTER — Other Ambulatory Visit (HOSPITAL_COMMUNITY): Payer: Self-pay

## 2018-06-24 DIAGNOSIS — E039 Hypothyroidism, unspecified: Secondary | ICD-10-CM | POA: Diagnosis not present

## 2018-06-24 DIAGNOSIS — L89312 Pressure ulcer of right buttock, stage 2: Secondary | ICD-10-CM | POA: Diagnosis not present

## 2018-06-24 DIAGNOSIS — M170 Bilateral primary osteoarthritis of knee: Secondary | ICD-10-CM | POA: Diagnosis not present

## 2018-06-24 DIAGNOSIS — Z9181 History of falling: Secondary | ICD-10-CM | POA: Diagnosis not present

## 2018-06-24 DIAGNOSIS — G4733 Obstructive sleep apnea (adult) (pediatric): Secondary | ICD-10-CM | POA: Diagnosis not present

## 2018-06-24 DIAGNOSIS — I4819 Other persistent atrial fibrillation: Secondary | ICD-10-CM | POA: Diagnosis not present

## 2018-06-24 DIAGNOSIS — N183 Chronic kidney disease, stage 3 (moderate): Secondary | ICD-10-CM | POA: Diagnosis not present

## 2018-06-24 DIAGNOSIS — Z95810 Presence of automatic (implantable) cardiac defibrillator: Secondary | ICD-10-CM | POA: Diagnosis not present

## 2018-06-24 DIAGNOSIS — I130 Hypertensive heart and chronic kidney disease with heart failure and stage 1 through stage 4 chronic kidney disease, or unspecified chronic kidney disease: Secondary | ICD-10-CM | POA: Diagnosis not present

## 2018-06-24 DIAGNOSIS — Z7901 Long term (current) use of anticoagulants: Secondary | ICD-10-CM | POA: Diagnosis not present

## 2018-06-24 DIAGNOSIS — D631 Anemia in chronic kidney disease: Secondary | ICD-10-CM | POA: Diagnosis not present

## 2018-06-24 DIAGNOSIS — M1612 Unilateral primary osteoarthritis, left hip: Secondary | ICD-10-CM | POA: Diagnosis not present

## 2018-06-24 DIAGNOSIS — I5023 Acute on chronic systolic (congestive) heart failure: Secondary | ICD-10-CM | POA: Diagnosis not present

## 2018-06-24 DIAGNOSIS — I447 Left bundle-branch block, unspecified: Secondary | ICD-10-CM | POA: Diagnosis not present

## 2018-06-24 DIAGNOSIS — E1122 Type 2 diabetes mellitus with diabetic chronic kidney disease: Secondary | ICD-10-CM | POA: Diagnosis not present

## 2018-06-24 DIAGNOSIS — Z7984 Long term (current) use of oral hypoglycemic drugs: Secondary | ICD-10-CM | POA: Diagnosis not present

## 2018-06-24 DIAGNOSIS — I420 Dilated cardiomyopathy: Secondary | ICD-10-CM | POA: Diagnosis not present

## 2018-06-24 DIAGNOSIS — M4807 Spinal stenosis, lumbosacral region: Secondary | ICD-10-CM | POA: Diagnosis not present

## 2018-06-24 DIAGNOSIS — I2510 Atherosclerotic heart disease of native coronary artery without angina pectoris: Secondary | ICD-10-CM | POA: Diagnosis not present

## 2018-06-24 MED ORDER — MIDODRINE HCL 2.5 MG PO TABS: 2.5000 mg | ORAL_TABLET | Freq: Three times a day (TID) | ORAL | 0 refills | Status: DC

## 2018-06-24 MED ORDER — MIDODRINE HCL 5 MG PO TABS: 5.0000 mg | ORAL_TABLET | Freq: Three times a day (TID) | ORAL | 0 refills | Status: DC

## 2018-06-26 ENCOUNTER — Telehealth: Payer: Self-pay | Admitting: Family Medicine

## 2018-06-26 DIAGNOSIS — I2510 Atherosclerotic heart disease of native coronary artery without angina pectoris: Secondary | ICD-10-CM | POA: Diagnosis not present

## 2018-06-26 DIAGNOSIS — M1612 Unilateral primary osteoarthritis, left hip: Secondary | ICD-10-CM | POA: Diagnosis not present

## 2018-06-26 DIAGNOSIS — I420 Dilated cardiomyopathy: Secondary | ICD-10-CM | POA: Diagnosis not present

## 2018-06-26 DIAGNOSIS — Z7901 Long term (current) use of anticoagulants: Secondary | ICD-10-CM | POA: Diagnosis not present

## 2018-06-26 DIAGNOSIS — I130 Hypertensive heart and chronic kidney disease with heart failure and stage 1 through stage 4 chronic kidney disease, or unspecified chronic kidney disease: Secondary | ICD-10-CM | POA: Diagnosis not present

## 2018-06-26 DIAGNOSIS — E1122 Type 2 diabetes mellitus with diabetic chronic kidney disease: Secondary | ICD-10-CM | POA: Diagnosis not present

## 2018-06-26 DIAGNOSIS — I5023 Acute on chronic systolic (congestive) heart failure: Secondary | ICD-10-CM | POA: Diagnosis not present

## 2018-06-26 DIAGNOSIS — Z95810 Presence of automatic (implantable) cardiac defibrillator: Secondary | ICD-10-CM | POA: Diagnosis not present

## 2018-06-26 DIAGNOSIS — E039 Hypothyroidism, unspecified: Secondary | ICD-10-CM | POA: Diagnosis not present

## 2018-06-26 DIAGNOSIS — L89312 Pressure ulcer of right buttock, stage 2: Secondary | ICD-10-CM | POA: Diagnosis not present

## 2018-06-26 DIAGNOSIS — D631 Anemia in chronic kidney disease: Secondary | ICD-10-CM | POA: Diagnosis not present

## 2018-06-26 DIAGNOSIS — G4733 Obstructive sleep apnea (adult) (pediatric): Secondary | ICD-10-CM | POA: Diagnosis not present

## 2018-06-26 DIAGNOSIS — M170 Bilateral primary osteoarthritis of knee: Secondary | ICD-10-CM | POA: Diagnosis not present

## 2018-06-26 DIAGNOSIS — M4807 Spinal stenosis, lumbosacral region: Secondary | ICD-10-CM | POA: Diagnosis not present

## 2018-06-26 DIAGNOSIS — N183 Chronic kidney disease, stage 3 (moderate): Secondary | ICD-10-CM | POA: Diagnosis not present

## 2018-06-26 DIAGNOSIS — I4819 Other persistent atrial fibrillation: Secondary | ICD-10-CM | POA: Diagnosis not present

## 2018-06-26 DIAGNOSIS — Z9181 History of falling: Secondary | ICD-10-CM | POA: Diagnosis not present

## 2018-06-26 DIAGNOSIS — Z7984 Long term (current) use of oral hypoglycemic drugs: Secondary | ICD-10-CM | POA: Diagnosis not present

## 2018-06-26 DIAGNOSIS — I447 Left bundle-branch block, unspecified: Secondary | ICD-10-CM | POA: Diagnosis not present

## 2018-06-26 NOTE — Telephone Encounter (Signed)
Patient's wife advised

## 2018-06-26 NOTE — Telephone Encounter (Signed)
Received letter from [employer]  [facility] [place].  [name] [name] has been apporoved.  When you receive Jardiance please take it daily in the morning and stop taking the Glimepride

## 2018-06-27 DIAGNOSIS — M170 Bilateral primary osteoarthritis of knee: Secondary | ICD-10-CM | POA: Diagnosis not present

## 2018-06-27 DIAGNOSIS — G4733 Obstructive sleep apnea (adult) (pediatric): Secondary | ICD-10-CM | POA: Diagnosis not present

## 2018-06-27 DIAGNOSIS — L89312 Pressure ulcer of right buttock, stage 2: Secondary | ICD-10-CM | POA: Diagnosis not present

## 2018-06-27 DIAGNOSIS — M4807 Spinal stenosis, lumbosacral region: Secondary | ICD-10-CM | POA: Diagnosis not present

## 2018-06-27 DIAGNOSIS — E1122 Type 2 diabetes mellitus with diabetic chronic kidney disease: Secondary | ICD-10-CM | POA: Diagnosis not present

## 2018-06-27 DIAGNOSIS — I4819 Other persistent atrial fibrillation: Secondary | ICD-10-CM | POA: Diagnosis not present

## 2018-06-27 DIAGNOSIS — Z9181 History of falling: Secondary | ICD-10-CM | POA: Diagnosis not present

## 2018-06-27 DIAGNOSIS — M1612 Unilateral primary osteoarthritis, left hip: Secondary | ICD-10-CM | POA: Diagnosis not present

## 2018-06-27 DIAGNOSIS — Z7901 Long term (current) use of anticoagulants: Secondary | ICD-10-CM | POA: Diagnosis not present

## 2018-06-27 DIAGNOSIS — I130 Hypertensive heart and chronic kidney disease with heart failure and stage 1 through stage 4 chronic kidney disease, or unspecified chronic kidney disease: Secondary | ICD-10-CM | POA: Diagnosis not present

## 2018-06-27 DIAGNOSIS — I5023 Acute on chronic systolic (congestive) heart failure: Secondary | ICD-10-CM | POA: Diagnosis not present

## 2018-06-27 DIAGNOSIS — Z7984 Long term (current) use of oral hypoglycemic drugs: Secondary | ICD-10-CM | POA: Diagnosis not present

## 2018-06-27 DIAGNOSIS — I2510 Atherosclerotic heart disease of native coronary artery without angina pectoris: Secondary | ICD-10-CM | POA: Diagnosis not present

## 2018-06-27 DIAGNOSIS — Z95810 Presence of automatic (implantable) cardiac defibrillator: Secondary | ICD-10-CM | POA: Diagnosis not present

## 2018-06-27 DIAGNOSIS — D631 Anemia in chronic kidney disease: Secondary | ICD-10-CM | POA: Diagnosis not present

## 2018-06-27 DIAGNOSIS — E039 Hypothyroidism, unspecified: Secondary | ICD-10-CM | POA: Diagnosis not present

## 2018-06-27 DIAGNOSIS — N183 Chronic kidney disease, stage 3 (moderate): Secondary | ICD-10-CM | POA: Diagnosis not present

## 2018-06-27 DIAGNOSIS — I420 Dilated cardiomyopathy: Secondary | ICD-10-CM | POA: Diagnosis not present

## 2018-06-27 DIAGNOSIS — I447 Left bundle-branch block, unspecified: Secondary | ICD-10-CM | POA: Diagnosis not present

## 2018-06-30 ENCOUNTER — Other Ambulatory Visit: Payer: Self-pay

## 2018-06-30 DIAGNOSIS — Z7984 Long term (current) use of oral hypoglycemic drugs: Secondary | ICD-10-CM | POA: Diagnosis not present

## 2018-06-30 DIAGNOSIS — E1122 Type 2 diabetes mellitus with diabetic chronic kidney disease: Secondary | ICD-10-CM | POA: Diagnosis not present

## 2018-06-30 DIAGNOSIS — I130 Hypertensive heart and chronic kidney disease with heart failure and stage 1 through stage 4 chronic kidney disease, or unspecified chronic kidney disease: Secondary | ICD-10-CM | POA: Diagnosis not present

## 2018-06-30 DIAGNOSIS — M4807 Spinal stenosis, lumbosacral region: Secondary | ICD-10-CM | POA: Diagnosis not present

## 2018-06-30 DIAGNOSIS — D631 Anemia in chronic kidney disease: Secondary | ICD-10-CM | POA: Diagnosis not present

## 2018-06-30 DIAGNOSIS — M1612 Unilateral primary osteoarthritis, left hip: Secondary | ICD-10-CM | POA: Diagnosis not present

## 2018-06-30 DIAGNOSIS — G4733 Obstructive sleep apnea (adult) (pediatric): Secondary | ICD-10-CM | POA: Diagnosis not present

## 2018-06-30 DIAGNOSIS — I2510 Atherosclerotic heart disease of native coronary artery without angina pectoris: Secondary | ICD-10-CM | POA: Diagnosis not present

## 2018-06-30 DIAGNOSIS — Z7901 Long term (current) use of anticoagulants: Secondary | ICD-10-CM | POA: Diagnosis not present

## 2018-06-30 DIAGNOSIS — Z9181 History of falling: Secondary | ICD-10-CM | POA: Diagnosis not present

## 2018-06-30 DIAGNOSIS — M170 Bilateral primary osteoarthritis of knee: Secondary | ICD-10-CM | POA: Diagnosis not present

## 2018-06-30 DIAGNOSIS — I4819 Other persistent atrial fibrillation: Secondary | ICD-10-CM | POA: Diagnosis not present

## 2018-06-30 DIAGNOSIS — Z95810 Presence of automatic (implantable) cardiac defibrillator: Secondary | ICD-10-CM | POA: Diagnosis not present

## 2018-06-30 DIAGNOSIS — E039 Hypothyroidism, unspecified: Secondary | ICD-10-CM | POA: Diagnosis not present

## 2018-06-30 DIAGNOSIS — I447 Left bundle-branch block, unspecified: Secondary | ICD-10-CM | POA: Diagnosis not present

## 2018-06-30 DIAGNOSIS — I5023 Acute on chronic systolic (congestive) heart failure: Secondary | ICD-10-CM | POA: Diagnosis not present

## 2018-06-30 DIAGNOSIS — I420 Dilated cardiomyopathy: Secondary | ICD-10-CM | POA: Diagnosis not present

## 2018-06-30 DIAGNOSIS — N183 Chronic kidney disease, stage 3 (moderate): Secondary | ICD-10-CM | POA: Diagnosis not present

## 2018-06-30 DIAGNOSIS — L89312 Pressure ulcer of right buttock, stage 2: Secondary | ICD-10-CM | POA: Diagnosis not present

## 2018-06-30 MED ORDER — WARFARIN SODIUM 5 MG PO TABS: 5.0000 mg | ORAL_TABLET | Freq: Every day | ORAL | 3 refills | Status: DC

## 2018-06-30 MED ORDER — WARFARIN SODIUM 1 MG PO TABS: ORAL_TABLET | ORAL | 1 refills | Status: DC

## 2018-07-01 DIAGNOSIS — Z95810 Presence of automatic (implantable) cardiac defibrillator: Secondary | ICD-10-CM | POA: Diagnosis not present

## 2018-07-01 DIAGNOSIS — I130 Hypertensive heart and chronic kidney disease with heart failure and stage 1 through stage 4 chronic kidney disease, or unspecified chronic kidney disease: Secondary | ICD-10-CM | POA: Diagnosis not present

## 2018-07-01 DIAGNOSIS — Z7984 Long term (current) use of oral hypoglycemic drugs: Secondary | ICD-10-CM | POA: Diagnosis not present

## 2018-07-01 DIAGNOSIS — N183 Chronic kidney disease, stage 3 (moderate): Secondary | ICD-10-CM | POA: Diagnosis not present

## 2018-07-01 DIAGNOSIS — G4733 Obstructive sleep apnea (adult) (pediatric): Secondary | ICD-10-CM | POA: Diagnosis not present

## 2018-07-01 DIAGNOSIS — Z7901 Long term (current) use of anticoagulants: Secondary | ICD-10-CM | POA: Diagnosis not present

## 2018-07-01 DIAGNOSIS — I420 Dilated cardiomyopathy: Secondary | ICD-10-CM | POA: Diagnosis not present

## 2018-07-01 DIAGNOSIS — M1612 Unilateral primary osteoarthritis, left hip: Secondary | ICD-10-CM | POA: Diagnosis not present

## 2018-07-01 DIAGNOSIS — I5023 Acute on chronic systolic (congestive) heart failure: Secondary | ICD-10-CM | POA: Diagnosis not present

## 2018-07-01 DIAGNOSIS — E1122 Type 2 diabetes mellitus with diabetic chronic kidney disease: Secondary | ICD-10-CM | POA: Diagnosis not present

## 2018-07-01 DIAGNOSIS — I2510 Atherosclerotic heart disease of native coronary artery without angina pectoris: Secondary | ICD-10-CM | POA: Diagnosis not present

## 2018-07-01 DIAGNOSIS — D631 Anemia in chronic kidney disease: Secondary | ICD-10-CM | POA: Diagnosis not present

## 2018-07-01 DIAGNOSIS — I447 Left bundle-branch block, unspecified: Secondary | ICD-10-CM | POA: Diagnosis not present

## 2018-07-01 DIAGNOSIS — Z9181 History of falling: Secondary | ICD-10-CM | POA: Diagnosis not present

## 2018-07-01 DIAGNOSIS — L89312 Pressure ulcer of right buttock, stage 2: Secondary | ICD-10-CM | POA: Diagnosis not present

## 2018-07-01 DIAGNOSIS — I4819 Other persistent atrial fibrillation: Secondary | ICD-10-CM | POA: Diagnosis not present

## 2018-07-01 DIAGNOSIS — E039 Hypothyroidism, unspecified: Secondary | ICD-10-CM | POA: Diagnosis not present

## 2018-07-01 DIAGNOSIS — M170 Bilateral primary osteoarthritis of knee: Secondary | ICD-10-CM | POA: Diagnosis not present

## 2018-07-01 DIAGNOSIS — M4807 Spinal stenosis, lumbosacral region: Secondary | ICD-10-CM | POA: Diagnosis not present

## 2018-07-02 DIAGNOSIS — I5023 Acute on chronic systolic (congestive) heart failure: Secondary | ICD-10-CM | POA: Diagnosis not present

## 2018-07-02 DIAGNOSIS — D631 Anemia in chronic kidney disease: Secondary | ICD-10-CM | POA: Diagnosis not present

## 2018-07-02 DIAGNOSIS — E1122 Type 2 diabetes mellitus with diabetic chronic kidney disease: Secondary | ICD-10-CM | POA: Diagnosis not present

## 2018-07-02 DIAGNOSIS — I4819 Other persistent atrial fibrillation: Secondary | ICD-10-CM | POA: Diagnosis not present

## 2018-07-02 DIAGNOSIS — I420 Dilated cardiomyopathy: Secondary | ICD-10-CM | POA: Diagnosis not present

## 2018-07-02 DIAGNOSIS — E039 Hypothyroidism, unspecified: Secondary | ICD-10-CM | POA: Diagnosis not present

## 2018-07-02 DIAGNOSIS — L89312 Pressure ulcer of right buttock, stage 2: Secondary | ICD-10-CM | POA: Diagnosis not present

## 2018-07-02 DIAGNOSIS — I2510 Atherosclerotic heart disease of native coronary artery without angina pectoris: Secondary | ICD-10-CM | POA: Diagnosis not present

## 2018-07-02 DIAGNOSIS — Z95810 Presence of automatic (implantable) cardiac defibrillator: Secondary | ICD-10-CM | POA: Diagnosis not present

## 2018-07-02 DIAGNOSIS — G4733 Obstructive sleep apnea (adult) (pediatric): Secondary | ICD-10-CM | POA: Diagnosis not present

## 2018-07-02 DIAGNOSIS — Z7901 Long term (current) use of anticoagulants: Secondary | ICD-10-CM | POA: Diagnosis not present

## 2018-07-02 DIAGNOSIS — M1612 Unilateral primary osteoarthritis, left hip: Secondary | ICD-10-CM | POA: Diagnosis not present

## 2018-07-02 DIAGNOSIS — Z9181 History of falling: Secondary | ICD-10-CM | POA: Diagnosis not present

## 2018-07-02 DIAGNOSIS — I447 Left bundle-branch block, unspecified: Secondary | ICD-10-CM | POA: Diagnosis not present

## 2018-07-02 DIAGNOSIS — I130 Hypertensive heart and chronic kidney disease with heart failure and stage 1 through stage 4 chronic kidney disease, or unspecified chronic kidney disease: Secondary | ICD-10-CM | POA: Diagnosis not present

## 2018-07-02 DIAGNOSIS — M170 Bilateral primary osteoarthritis of knee: Secondary | ICD-10-CM | POA: Diagnosis not present

## 2018-07-02 DIAGNOSIS — M4807 Spinal stenosis, lumbosacral region: Secondary | ICD-10-CM | POA: Diagnosis not present

## 2018-07-02 DIAGNOSIS — N183 Chronic kidney disease, stage 3 (moderate): Secondary | ICD-10-CM | POA: Diagnosis not present

## 2018-07-02 DIAGNOSIS — Z7984 Long term (current) use of oral hypoglycemic drugs: Secondary | ICD-10-CM | POA: Diagnosis not present

## 2018-07-03 DIAGNOSIS — D631 Anemia in chronic kidney disease: Secondary | ICD-10-CM | POA: Diagnosis not present

## 2018-07-03 DIAGNOSIS — I130 Hypertensive heart and chronic kidney disease with heart failure and stage 1 through stage 4 chronic kidney disease, or unspecified chronic kidney disease: Secondary | ICD-10-CM | POA: Diagnosis not present

## 2018-07-03 DIAGNOSIS — Z9181 History of falling: Secondary | ICD-10-CM | POA: Diagnosis not present

## 2018-07-03 DIAGNOSIS — G4733 Obstructive sleep apnea (adult) (pediatric): Secondary | ICD-10-CM | POA: Diagnosis not present

## 2018-07-03 DIAGNOSIS — Z95810 Presence of automatic (implantable) cardiac defibrillator: Secondary | ICD-10-CM | POA: Diagnosis not present

## 2018-07-03 DIAGNOSIS — N183 Chronic kidney disease, stage 3 (moderate): Secondary | ICD-10-CM | POA: Diagnosis not present

## 2018-07-03 DIAGNOSIS — E039 Hypothyroidism, unspecified: Secondary | ICD-10-CM | POA: Diagnosis not present

## 2018-07-03 DIAGNOSIS — I5023 Acute on chronic systolic (congestive) heart failure: Secondary | ICD-10-CM | POA: Diagnosis not present

## 2018-07-03 DIAGNOSIS — M1612 Unilateral primary osteoarthritis, left hip: Secondary | ICD-10-CM | POA: Diagnosis not present

## 2018-07-03 DIAGNOSIS — I4819 Other persistent atrial fibrillation: Secondary | ICD-10-CM | POA: Diagnosis not present

## 2018-07-03 DIAGNOSIS — I2510 Atherosclerotic heart disease of native coronary artery without angina pectoris: Secondary | ICD-10-CM | POA: Diagnosis not present

## 2018-07-03 DIAGNOSIS — I447 Left bundle-branch block, unspecified: Secondary | ICD-10-CM | POA: Diagnosis not present

## 2018-07-03 DIAGNOSIS — I420 Dilated cardiomyopathy: Secondary | ICD-10-CM | POA: Diagnosis not present

## 2018-07-03 DIAGNOSIS — L89312 Pressure ulcer of right buttock, stage 2: Secondary | ICD-10-CM | POA: Diagnosis not present

## 2018-07-03 DIAGNOSIS — Z7901 Long term (current) use of anticoagulants: Secondary | ICD-10-CM | POA: Diagnosis not present

## 2018-07-03 DIAGNOSIS — M4807 Spinal stenosis, lumbosacral region: Secondary | ICD-10-CM | POA: Diagnosis not present

## 2018-07-03 DIAGNOSIS — Z7984 Long term (current) use of oral hypoglycemic drugs: Secondary | ICD-10-CM | POA: Diagnosis not present

## 2018-07-03 DIAGNOSIS — M170 Bilateral primary osteoarthritis of knee: Secondary | ICD-10-CM | POA: Diagnosis not present

## 2018-07-03 DIAGNOSIS — E1122 Type 2 diabetes mellitus with diabetic chronic kidney disease: Secondary | ICD-10-CM | POA: Diagnosis not present

## 2018-07-04 DIAGNOSIS — G4733 Obstructive sleep apnea (adult) (pediatric): Secondary | ICD-10-CM | POA: Diagnosis not present

## 2018-07-04 DIAGNOSIS — L89312 Pressure ulcer of right buttock, stage 2: Secondary | ICD-10-CM | POA: Diagnosis not present

## 2018-07-04 DIAGNOSIS — I4819 Other persistent atrial fibrillation: Secondary | ICD-10-CM | POA: Diagnosis not present

## 2018-07-04 DIAGNOSIS — Z95810 Presence of automatic (implantable) cardiac defibrillator: Secondary | ICD-10-CM | POA: Diagnosis not present

## 2018-07-04 DIAGNOSIS — M1612 Unilateral primary osteoarthritis, left hip: Secondary | ICD-10-CM | POA: Diagnosis not present

## 2018-07-04 DIAGNOSIS — E039 Hypothyroidism, unspecified: Secondary | ICD-10-CM | POA: Diagnosis not present

## 2018-07-04 DIAGNOSIS — E1122 Type 2 diabetes mellitus with diabetic chronic kidney disease: Secondary | ICD-10-CM | POA: Diagnosis not present

## 2018-07-04 DIAGNOSIS — I447 Left bundle-branch block, unspecified: Secondary | ICD-10-CM | POA: Diagnosis not present

## 2018-07-04 DIAGNOSIS — I5023 Acute on chronic systolic (congestive) heart failure: Secondary | ICD-10-CM | POA: Diagnosis not present

## 2018-07-04 DIAGNOSIS — I2510 Atherosclerotic heart disease of native coronary artery without angina pectoris: Secondary | ICD-10-CM | POA: Diagnosis not present

## 2018-07-04 DIAGNOSIS — I420 Dilated cardiomyopathy: Secondary | ICD-10-CM | POA: Diagnosis not present

## 2018-07-04 DIAGNOSIS — M4807 Spinal stenosis, lumbosacral region: Secondary | ICD-10-CM | POA: Diagnosis not present

## 2018-07-04 DIAGNOSIS — Z9181 History of falling: Secondary | ICD-10-CM | POA: Diagnosis not present

## 2018-07-04 DIAGNOSIS — D631 Anemia in chronic kidney disease: Secondary | ICD-10-CM | POA: Diagnosis not present

## 2018-07-04 DIAGNOSIS — I130 Hypertensive heart and chronic kidney disease with heart failure and stage 1 through stage 4 chronic kidney disease, or unspecified chronic kidney disease: Secondary | ICD-10-CM | POA: Diagnosis not present

## 2018-07-04 DIAGNOSIS — M170 Bilateral primary osteoarthritis of knee: Secondary | ICD-10-CM | POA: Diagnosis not present

## 2018-07-04 DIAGNOSIS — N183 Chronic kidney disease, stage 3 (moderate): Secondary | ICD-10-CM | POA: Diagnosis not present

## 2018-07-04 DIAGNOSIS — Z7901 Long term (current) use of anticoagulants: Secondary | ICD-10-CM | POA: Diagnosis not present

## 2018-07-04 DIAGNOSIS — Z7984 Long term (current) use of oral hypoglycemic drugs: Secondary | ICD-10-CM | POA: Diagnosis not present

## 2018-07-07 DIAGNOSIS — G4733 Obstructive sleep apnea (adult) (pediatric): Secondary | ICD-10-CM | POA: Diagnosis not present

## 2018-07-07 DIAGNOSIS — D631 Anemia in chronic kidney disease: Secondary | ICD-10-CM | POA: Diagnosis not present

## 2018-07-07 DIAGNOSIS — Z9181 History of falling: Secondary | ICD-10-CM | POA: Diagnosis not present

## 2018-07-07 DIAGNOSIS — M4807 Spinal stenosis, lumbosacral region: Secondary | ICD-10-CM | POA: Diagnosis not present

## 2018-07-07 DIAGNOSIS — L89312 Pressure ulcer of right buttock, stage 2: Secondary | ICD-10-CM | POA: Diagnosis not present

## 2018-07-07 DIAGNOSIS — M1612 Unilateral primary osteoarthritis, left hip: Secondary | ICD-10-CM | POA: Diagnosis not present

## 2018-07-07 DIAGNOSIS — E1122 Type 2 diabetes mellitus with diabetic chronic kidney disease: Secondary | ICD-10-CM | POA: Diagnosis not present

## 2018-07-07 DIAGNOSIS — Z7984 Long term (current) use of oral hypoglycemic drugs: Secondary | ICD-10-CM | POA: Diagnosis not present

## 2018-07-07 DIAGNOSIS — I4819 Other persistent atrial fibrillation: Secondary | ICD-10-CM | POA: Diagnosis not present

## 2018-07-07 DIAGNOSIS — I447 Left bundle-branch block, unspecified: Secondary | ICD-10-CM | POA: Diagnosis not present

## 2018-07-07 DIAGNOSIS — I2510 Atherosclerotic heart disease of native coronary artery without angina pectoris: Secondary | ICD-10-CM | POA: Diagnosis not present

## 2018-07-07 DIAGNOSIS — N183 Chronic kidney disease, stage 3 (moderate): Secondary | ICD-10-CM | POA: Diagnosis not present

## 2018-07-07 DIAGNOSIS — M170 Bilateral primary osteoarthritis of knee: Secondary | ICD-10-CM | POA: Diagnosis not present

## 2018-07-07 DIAGNOSIS — Z7901 Long term (current) use of anticoagulants: Secondary | ICD-10-CM | POA: Diagnosis not present

## 2018-07-07 DIAGNOSIS — I5023 Acute on chronic systolic (congestive) heart failure: Secondary | ICD-10-CM | POA: Diagnosis not present

## 2018-07-07 DIAGNOSIS — I130 Hypertensive heart and chronic kidney disease with heart failure and stage 1 through stage 4 chronic kidney disease, or unspecified chronic kidney disease: Secondary | ICD-10-CM | POA: Diagnosis not present

## 2018-07-07 DIAGNOSIS — I420 Dilated cardiomyopathy: Secondary | ICD-10-CM | POA: Diagnosis not present

## 2018-07-07 DIAGNOSIS — E039 Hypothyroidism, unspecified: Secondary | ICD-10-CM | POA: Diagnosis not present

## 2018-07-07 DIAGNOSIS — Z95810 Presence of automatic (implantable) cardiac defibrillator: Secondary | ICD-10-CM | POA: Diagnosis not present

## 2018-07-08 ENCOUNTER — Telehealth: Payer: Self-pay

## 2018-07-08 ENCOUNTER — Encounter (HOSPITAL_COMMUNITY): Payer: Self-pay | Admitting: Cardiology

## 2018-07-08 ENCOUNTER — Ambulatory Visit (HOSPITAL_COMMUNITY)
Admission: RE | Admit: 2018-07-08 | Discharge: 2018-07-08 | Disposition: A | Discharge status: Home / Self Care | Payer: Medicare Other | Source: Ambulatory Visit | Attending: Cardiology | Admitting: Cardiology

## 2018-07-08 VITALS — BP 120/70 | HR 80 | Wt 186.2 lb

## 2018-07-08 DIAGNOSIS — Z79899 Other long term (current) drug therapy: Secondary | ICD-10-CM | POA: Diagnosis not present

## 2018-07-08 DIAGNOSIS — I110 Hypertensive heart disease with heart failure: Secondary | ICD-10-CM | POA: Insufficient documentation

## 2018-07-08 DIAGNOSIS — G20 Parkinson's disease: Secondary | ICD-10-CM | POA: Insufficient documentation

## 2018-07-08 DIAGNOSIS — I951 Orthostatic hypotension: Secondary | ICD-10-CM | POA: Insufficient documentation

## 2018-07-08 DIAGNOSIS — I447 Left bundle-branch block, unspecified: Secondary | ICD-10-CM | POA: Diagnosis not present

## 2018-07-08 DIAGNOSIS — N400 Enlarged prostate without lower urinary tract symptoms: Secondary | ICD-10-CM | POA: Diagnosis not present

## 2018-07-08 DIAGNOSIS — I2510 Atherosclerotic heart disease of native coronary artery without angina pectoris: Secondary | ICD-10-CM | POA: Insufficient documentation

## 2018-07-08 DIAGNOSIS — Z79890 Hormone replacement therapy (postmenopausal): Secondary | ICD-10-CM | POA: Diagnosis not present

## 2018-07-08 DIAGNOSIS — Z95810 Presence of automatic (implantable) cardiac defibrillator: Secondary | ICD-10-CM

## 2018-07-08 DIAGNOSIS — I428 Other cardiomyopathies: Secondary | ICD-10-CM | POA: Diagnosis not present

## 2018-07-08 DIAGNOSIS — I5022 Chronic systolic (congestive) heart failure: Secondary | ICD-10-CM | POA: Diagnosis not present

## 2018-07-08 DIAGNOSIS — E785 Hyperlipidemia, unspecified: Secondary | ICD-10-CM | POA: Diagnosis not present

## 2018-07-08 DIAGNOSIS — K4090 Unilateral inguinal hernia, without obstruction or gangrene, not specified as recurrent: Secondary | ICD-10-CM | POA: Insufficient documentation

## 2018-07-08 DIAGNOSIS — Z7901 Long term (current) use of anticoagulants: Secondary | ICD-10-CM | POA: Insufficient documentation

## 2018-07-08 DIAGNOSIS — E039 Hypothyroidism, unspecified: Secondary | ICD-10-CM | POA: Insufficient documentation

## 2018-07-08 DIAGNOSIS — I482 Chronic atrial fibrillation, unspecified: Secondary | ICD-10-CM | POA: Insufficient documentation

## 2018-07-08 DIAGNOSIS — G4733 Obstructive sleep apnea (adult) (pediatric): Secondary | ICD-10-CM | POA: Insufficient documentation

## 2018-07-08 DIAGNOSIS — I480 Paroxysmal atrial fibrillation: Secondary | ICD-10-CM | POA: Insufficient documentation

## 2018-07-08 LAB — BASIC METABOLIC PANEL
Anion gap: 12 (ref 5–15)
BUN: 21 mg/dL (ref 8–23)
CO2: 28 mmol/L (ref 22–32)
Calcium: 8.9 mg/dL (ref 8.9–10.3)
Chloride: 101 mmol/L (ref 98–111)
Creatinine, Ser: 1.17 mg/dL (ref 0.61–1.24)
GFR calc Af Amer: >60 mL/min (ref >60)
GFR, EST NON AFRICAN AMERICAN: 57 mL/min — AB (ref >60)
Glucose, Bld: 120 mg/dL — ABNORMAL HIGH (ref 70–99)
Potassium: 4 mmol/L (ref 3.5–5.1)
Sodium: 141 mmol/L (ref 135–145)

## 2018-07-08 LAB — DIGOXIN LEVEL: Digoxin Level: <0.2 ng/mL — ABNORMAL LOW (ref 0.8–2.0)

## 2018-07-08 MED ORDER — MIDODRINE HCL 5 MG PO TABS: 2.5000 mg | ORAL_TABLET | Freq: Three times a day (TID) | ORAL | 1 refills | Status: DC

## 2018-07-08 NOTE — Progress Notes (Signed)
Patient ID: [name] EdisVernon E Graham, male   DOB: 04/14/1932, 85 y.o.   MRN: [id number]     Advanced Heart Failure Clinic Note   Primary Cardiologist: Dr Mayford Knifeurner PCP: Dr Denyse Amassorey HF Cardiology: Shirlee LatchMcLean  HPI: [name] [name] is a 85 y.o. male with PMH of A tach s/p DC-CV 12/29/12, CAD, PAF on chronic coumadin and amiodarone, OSA- CPAP, NICM cath 2008, chronic systolic heart failure EF 25-30% (01/2013) , S/P Medtronic CRT-D 2008 and generator changed 2012, LBBB, CRI (baseline 1.7-1.8) and Hypothyroidism.   He is S/P DC-CV 12/29/12 and initially he felt good for about a week. He had a functional decline and was only able to walk a few steps. [facility] with increased dyspnea on exertion and low extremity edema. He was started on a lasix gtt 10 mg hr. Admit weight 233 lbs. Discharge weight: 214 lbs.    He has been off Coreg due to fatigue.  He is now off amiodarone due to imbalance/gait instability.   He was started on Sinemet by neurology for suspected Parkinsons-type syndrome.    In 5/19, he was noted to be orthostatic and bisoprolol, ramipril, and spironolactone were stopped.  He was noted to be in an atrial tachycardia at that time. He has remained in chronic atrial fibrillation.   Echo 10/31/2016 LVEF 35-40%, Mod AI, Mod MR, Severe LAE, Moderate RVH, Severe RAE, PA peak pressure 19 mm Hg.  Echo (10/19) with EF 30-35%, mild LV dilation, normal RV size with mildly decreased systolic function, mild MR, mild AI. PYP scan (10/19) not suggestive of cardiac amyloidosis.   He returns for followup of CHF.  Since starting midodrine, he has not had any falls and is not generally lightheaded with standing.  He walks with a Voges.  He is not very active but does not have dyspnea walking around his house. He has developed an inguinal hernia that is painful at times.   Medtronic device interrogation: thoracic impedance stable, chronic atrial fibrillation, no VT   Labs 8/21: Na 132 K 4.5 Cr 1.7 (stable)  Labs  03/02/13 NA 135 Potassium 4.6 Creatinine 1.6 Dig 1.6 TSH 3.95        04/02/13: digoxin 0.8, K+ 4.5, creatinine 1.48         09/08/13 K 4.4 Creatine 1.7          6/18: K 4, creatinine 1.32, TSH normal, hgb 12.9         9/18: TSH normal, K 4.4, creatinine 1.37, LFTs normal         11/18: K 4.1, creatinine 1.47, digoxin 0.3          8/19: digoxin < 0.5, K 4.2, creatinine 1.26, Hgb 13.2         11/19: K 3.8, creatinine 1.2         Review of systems complete and found to be negative unless listed in HPI.    Past Medical History:  Diagnosis Date  . Anemia, unspecified 04/07/2013  . Atrial fibrillation (HCC)    fibrillation/flutter  . BPH (benign prostatic hypertrophy)   . Chronic systolic CHF (congestive heart failure) (HCC)    Nonischemic DCM EF 10% s/p BiV AICD  . Coronary artery disease 2008   nonobstructive ASCAD  . Depression   . Hyperlipidemia   . Hypertension   . Hypothyroidism   . Left bundle branch block   . Nonischemic cardiomyopathy (HCC)    ef 10 %  . OSA (obstructive sleep apnea)  intolerant to CPAP  . Osteoarthritis   . Sleep apnea    stopped using CPAP  . Thrombocytopenia (HCC)     Current Outpatient Medications  Medication Sig Dispense Refill  . acetaminophen (TYLENOL) 650 MG CR tablet Take 1 tablet (650 mg total) by mouth every 8 (eight) hours as needed for pain. 90 tablet 3  . AMBULATORY NON FORMULARY MEDICATION Lancets pen type sufficient for three times daily checking. Dispense 3 month supply E11.65 1 each 12  . AMBULATORY NON FORMULARY MEDICATION Test strips for One Touch Verio Flex meter.  Test 3x daily.  Disp 3 month supply.  E11.65 1 each 12  . AMBULATORY NON FORMULARY MEDICATION Chair cushion (donut). Dx: G25.638 Fax: [phone] ([facility]) 1 each 1  . AMBULATORY NON FORMULARY MEDICATION Rolling Bissonnette with seat.  Disp 1.  Use as needed 1 each 0  . carbidopa-levodopa (SINEMET) 25-100 MG tablet Take 1 tablet by mouth 3 (three) times daily.    .  diclofenac sodium (VOLTAREN) 1 % GEL Apply 4 g topically 4 (four) times daily. To affected joint. 1440 g 3  . digoxin (LANOXIN) 0.125 MG tablet TAKE 1 TABLET BY MOUTH  EVERY OTHER DAY 45 tablet 3  . empagliflozin (JARDIANCE) 10 MG TABS tablet Take 10 mg by mouth daily. 90 tablet 1  . levothyroxine (SYNTHROID, LEVOTHROID) 137 MCG tablet TAKE 1 TABLET BY MOUTH  DAILY BEFORE BREAKFAST 90 tablet 1  . midodrine (PROAMATINE) 5 MG tablet Take 0.5 tablets (2.5 mg total) by mouth 3 (three) times daily with meals. 90 tablet 1  . mupirocin ointment (BACTROBAN) 2 % Apply to affected area BID for 7 days. 30 g 3  . nystatin cream (MYCOSTATIN) Apply 1 application topically 2 (two) times daily. 60 g 2  . Nystatin POWD Apply liberally to affected area 2 times per day 1 Bottle 11  . potassium chloride SA (K-DUR,KLOR-CON) 20 MEQ tablet Take 1 tablet (20 mEq total) by mouth daily. 90 tablet 3  . simvastatin (ZOCOR) 10 MG tablet TAKE 1 TABLET BY MOUTH  EVERY EVENING 90 tablet 3  . torsemide (DEMADEX) 20 MG tablet Take 1 tablet (20 mg total) by mouth daily. 30 tablet 0  . warfarin (COUMADIN) 1 MG tablet TAKE 1 TABLET BY MOUTH ON  MONDAY, WEDNESDAY, AND  FRIDAY ONLY. (IN ADDITION  TO 5 MG TABLET) 39 tablet 1  . warfarin (COUMADIN) 5 MG tablet Take 1 tablet (5 mg total) by mouth daily. 90 tablet 3   No current facility-administered medications for this encounter.     Vitals:   07/08/18 1218  BP: 120/70  Pulse: 80  SpO2: 98%  Weight: 84.5 kg (186 lb 3.2 oz)   Wt Readings from Last 3 Encounters:  07/08/18 84.5 kg (186 lb 3.2 oz)  06/19/18 87.1 kg (192 lb 1.3 oz)  05/20/18 87.5 kg (193 lb)    PHYSICAL EXAM: General: NAD Neck: No JVD, no thyromegaly or thyroid nodule.  Lungs: Clear to auscultation bilaterally with normal respiratory effort. CV: Nondisplaced PMI.  Heart regular S1/S2, no S3/S4, no murmur.  No peripheral edema.  No carotid bruit.  Normal pedal pulses.  Abdomen: Soft, nontender, no  hepatosplenomegaly, no distention.  Skin: Intact without lesions or rashes.  Neurologic: Alert and oriented x 3.  Psych: Normal affect. Extremities: No clubbing or cyanosis.  HEENT: Normal.   ASSESSMENT & PLAN:  1. Chronic systolic HF: NICM 01/2013 EF 20%; Medtronic CRT-D.  Most recent echo 10/19 showed EF 30-35%, mild LV  dilation, normal RV size with mildly decreased RV systolic function.  NYHA class III symptoms with prominent fatigue, possibly more related to deconditioning and autonomic neuropathy than CHF.  He is not volume overloaded on exam. - Continue torsemide 20 mg daily, BMET today.   - Stay off spironolactone, ramipril, and bisoprolol => due to orthostasis and falls.   - Continue digoxin, check level today.  - He is on empagliflozin.  2) OSA: Continue nightly CPAP 3) Atrial fibrillation: This is persistent, has been present since early 7/19 continuously.  He cannot take amiodarone due to prior toxicity.  He has seen Dr. Graciela HusbandsKlein who thought rate control + anticoagulation was the best policy.  - Continue warfarin.   4) Orthostatic hypotension: Suspect autonomic neuropathy.  May be part of the Parkinsons picture.  Cardiac amyloidosis is also a consideration with CHF and autonomic neuropathy.  Cannot get MRI with CRT-D device. PYP scan from 10/19 was not suggestive of transthyretin amyloidosis.  BP/symptoms have improved on midodrine, no recent falls.  - Continue midodrine 2.5 mg tid.   5) Parkinsons - type syndrome: Sinemet was initially helping, but not so much now.  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   6) Inguinal hernia: He would be at high risk for peri-operative morbidity.  Hopefully, the hernia can be treated non-operatively. [place].   Marca Anconaalton Lynne Righi, MD  07/08/2018

## 2018-07-08 NOTE — Patient Instructions (Signed)
Today you have been seen at the Heart failure clinic at [facility]   You had Lab work done today:  We will call you if your lab work is abnormal.. No news is good news!!   Follow up in 3 months with Dr. Shirlee Latch

## 2018-07-08 NOTE — Telephone Encounter (Signed)
[name] [name] called and states Quintavius's coumadin 1mg  needs to be changed to 4 days a week instead of 3 days a week.

## 2018-07-09 DIAGNOSIS — I5023 Acute on chronic systolic (congestive) heart failure: Secondary | ICD-10-CM | POA: Diagnosis not present

## 2018-07-09 DIAGNOSIS — E039 Hypothyroidism, unspecified: Secondary | ICD-10-CM | POA: Diagnosis not present

## 2018-07-09 DIAGNOSIS — N183 Chronic kidney disease, stage 3 (moderate): Secondary | ICD-10-CM | POA: Diagnosis not present

## 2018-07-09 DIAGNOSIS — M4807 Spinal stenosis, lumbosacral region: Secondary | ICD-10-CM | POA: Diagnosis not present

## 2018-07-09 DIAGNOSIS — D631 Anemia in chronic kidney disease: Secondary | ICD-10-CM | POA: Diagnosis not present

## 2018-07-09 DIAGNOSIS — L89312 Pressure ulcer of right buttock, stage 2: Secondary | ICD-10-CM | POA: Diagnosis not present

## 2018-07-09 DIAGNOSIS — M170 Bilateral primary osteoarthritis of knee: Secondary | ICD-10-CM | POA: Diagnosis not present

## 2018-07-09 DIAGNOSIS — I130 Hypertensive heart and chronic kidney disease with heart failure and stage 1 through stage 4 chronic kidney disease, or unspecified chronic kidney disease: Secondary | ICD-10-CM | POA: Diagnosis not present

## 2018-07-09 DIAGNOSIS — I4819 Other persistent atrial fibrillation: Secondary | ICD-10-CM | POA: Diagnosis not present

## 2018-07-09 DIAGNOSIS — E1122 Type 2 diabetes mellitus with diabetic chronic kidney disease: Secondary | ICD-10-CM | POA: Diagnosis not present

## 2018-07-09 DIAGNOSIS — Z7901 Long term (current) use of anticoagulants: Secondary | ICD-10-CM | POA: Diagnosis not present

## 2018-07-09 DIAGNOSIS — I447 Left bundle-branch block, unspecified: Secondary | ICD-10-CM | POA: Diagnosis not present

## 2018-07-09 DIAGNOSIS — I2510 Atherosclerotic heart disease of native coronary artery without angina pectoris: Secondary | ICD-10-CM | POA: Diagnosis not present

## 2018-07-09 DIAGNOSIS — Z9181 History of falling: Secondary | ICD-10-CM | POA: Diagnosis not present

## 2018-07-09 DIAGNOSIS — Z95810 Presence of automatic (implantable) cardiac defibrillator: Secondary | ICD-10-CM | POA: Diagnosis not present

## 2018-07-09 DIAGNOSIS — M1612 Unilateral primary osteoarthritis, left hip: Secondary | ICD-10-CM | POA: Diagnosis not present

## 2018-07-09 DIAGNOSIS — G4733 Obstructive sleep apnea (adult) (pediatric): Secondary | ICD-10-CM | POA: Diagnosis not present

## 2018-07-09 DIAGNOSIS — I420 Dilated cardiomyopathy: Secondary | ICD-10-CM | POA: Diagnosis not present

## 2018-07-09 DIAGNOSIS — Z7984 Long term (current) use of oral hypoglycemic drugs: Secondary | ICD-10-CM | POA: Diagnosis not present

## 2018-07-09 MED ORDER — WARFARIN SODIUM 1 MG PO TABS: ORAL_TABLET | ORAL | 1 refills | Status: DC

## 2018-07-09 NOTE — Telephone Encounter (Signed)
Pt's wife advised.

## 2018-07-09 NOTE — Telephone Encounter (Signed)
Updated warfarin prescription sent to [facility]

## 2018-07-10 ENCOUNTER — Other Ambulatory Visit (HOSPITAL_COMMUNITY): Payer: Self-pay

## 2018-07-10 DIAGNOSIS — Z9181 History of falling: Secondary | ICD-10-CM | POA: Diagnosis not present

## 2018-07-10 DIAGNOSIS — I2510 Atherosclerotic heart disease of native coronary artery without angina pectoris: Secondary | ICD-10-CM | POA: Diagnosis not present

## 2018-07-10 DIAGNOSIS — I5023 Acute on chronic systolic (congestive) heart failure: Secondary | ICD-10-CM | POA: Diagnosis not present

## 2018-07-10 DIAGNOSIS — I420 Dilated cardiomyopathy: Secondary | ICD-10-CM | POA: Diagnosis not present

## 2018-07-10 DIAGNOSIS — Z95810 Presence of automatic (implantable) cardiac defibrillator: Secondary | ICD-10-CM | POA: Diagnosis not present

## 2018-07-10 DIAGNOSIS — I447 Left bundle-branch block, unspecified: Secondary | ICD-10-CM | POA: Diagnosis not present

## 2018-07-10 DIAGNOSIS — Z7901 Long term (current) use of anticoagulants: Secondary | ICD-10-CM | POA: Diagnosis not present

## 2018-07-10 DIAGNOSIS — M1612 Unilateral primary osteoarthritis, left hip: Secondary | ICD-10-CM | POA: Diagnosis not present

## 2018-07-10 DIAGNOSIS — L89312 Pressure ulcer of right buttock, stage 2: Secondary | ICD-10-CM | POA: Diagnosis not present

## 2018-07-10 DIAGNOSIS — D631 Anemia in chronic kidney disease: Secondary | ICD-10-CM | POA: Diagnosis not present

## 2018-07-10 DIAGNOSIS — I130 Hypertensive heart and chronic kidney disease with heart failure and stage 1 through stage 4 chronic kidney disease, or unspecified chronic kidney disease: Secondary | ICD-10-CM | POA: Diagnosis not present

## 2018-07-10 DIAGNOSIS — E039 Hypothyroidism, unspecified: Secondary | ICD-10-CM | POA: Diagnosis not present

## 2018-07-10 DIAGNOSIS — M170 Bilateral primary osteoarthritis of knee: Secondary | ICD-10-CM | POA: Diagnosis not present

## 2018-07-10 DIAGNOSIS — M4807 Spinal stenosis, lumbosacral region: Secondary | ICD-10-CM | POA: Diagnosis not present

## 2018-07-10 DIAGNOSIS — I4819 Other persistent atrial fibrillation: Secondary | ICD-10-CM | POA: Diagnosis not present

## 2018-07-10 DIAGNOSIS — E1122 Type 2 diabetes mellitus with diabetic chronic kidney disease: Secondary | ICD-10-CM | POA: Diagnosis not present

## 2018-07-10 DIAGNOSIS — N183 Chronic kidney disease, stage 3 (moderate): Secondary | ICD-10-CM | POA: Diagnosis not present

## 2018-07-10 DIAGNOSIS — Z7984 Long term (current) use of oral hypoglycemic drugs: Secondary | ICD-10-CM | POA: Diagnosis not present

## 2018-07-10 DIAGNOSIS — G4733 Obstructive sleep apnea (adult) (pediatric): Secondary | ICD-10-CM | POA: Diagnosis not present

## 2018-07-10 MED ORDER — MIDODRINE HCL 2.5 MG PO TABS: 2.5000 mg | ORAL_TABLET | Freq: Three times a day (TID) | ORAL | 1 refills | Status: DC

## 2018-07-11 ENCOUNTER — Other Ambulatory Visit (HOSPITAL_COMMUNITY): Payer: Self-pay

## 2018-07-11 ENCOUNTER — Encounter: Payer: Self-pay | Admitting: Family Medicine

## 2018-07-11 ENCOUNTER — Telehealth (HOSPITAL_COMMUNITY): Payer: Self-pay

## 2018-07-11 ENCOUNTER — Ambulatory Visit (INDEPENDENT_AMBULATORY_CARE_PROVIDER_SITE_OTHER): Payer: Medicare Other | Admitting: Family Medicine

## 2018-07-11 VITALS — BP 108/73 | HR 84 | Wt 186.0 lb

## 2018-07-11 DIAGNOSIS — E1159 Type 2 diabetes mellitus with other circulatory complications: Secondary | ICD-10-CM | POA: Diagnosis not present

## 2018-07-11 DIAGNOSIS — M1612 Unilateral primary osteoarthritis, left hip: Secondary | ICD-10-CM | POA: Diagnosis not present

## 2018-07-11 DIAGNOSIS — I25708 Atherosclerosis of coronary artery bypass graft(s), unspecified, with other forms of angina pectoris: Secondary | ICD-10-CM

## 2018-07-11 DIAGNOSIS — M4807 Spinal stenosis, lumbosacral region: Secondary | ICD-10-CM | POA: Diagnosis not present

## 2018-07-11 DIAGNOSIS — E859 Amyloidosis, unspecified: Secondary | ICD-10-CM

## 2018-07-11 DIAGNOSIS — I4819 Other persistent atrial fibrillation: Secondary | ICD-10-CM | POA: Diagnosis not present

## 2018-07-11 DIAGNOSIS — I714 Abdominal aortic aneurysm, without rupture, unspecified: Secondary | ICD-10-CM

## 2018-07-11 DIAGNOSIS — N183 Chronic kidney disease, stage 3 unspecified: Secondary | ICD-10-CM

## 2018-07-11 DIAGNOSIS — I420 Dilated cardiomyopathy: Secondary | ICD-10-CM | POA: Diagnosis not present

## 2018-07-11 DIAGNOSIS — Z7984 Long term (current) use of oral hypoglycemic drugs: Secondary | ICD-10-CM | POA: Diagnosis not present

## 2018-07-11 DIAGNOSIS — E039 Hypothyroidism, unspecified: Secondary | ICD-10-CM | POA: Diagnosis not present

## 2018-07-11 DIAGNOSIS — I130 Hypertensive heart and chronic kidney disease with heart failure and stage 1 through stage 4 chronic kidney disease, or unspecified chronic kidney disease: Secondary | ICD-10-CM | POA: Diagnosis not present

## 2018-07-11 DIAGNOSIS — E1122 Type 2 diabetes mellitus with diabetic chronic kidney disease: Secondary | ICD-10-CM

## 2018-07-11 DIAGNOSIS — Z7901 Long term (current) use of anticoagulants: Secondary | ICD-10-CM | POA: Diagnosis not present

## 2018-07-11 DIAGNOSIS — G4733 Obstructive sleep apnea (adult) (pediatric): Secondary | ICD-10-CM | POA: Diagnosis not present

## 2018-07-11 DIAGNOSIS — M170 Bilateral primary osteoarthritis of knee: Secondary | ICD-10-CM | POA: Diagnosis not present

## 2018-07-11 DIAGNOSIS — I10 Essential (primary) hypertension: Secondary | ICD-10-CM

## 2018-07-11 DIAGNOSIS — Z95810 Presence of automatic (implantable) cardiac defibrillator: Secondary | ICD-10-CM | POA: Diagnosis not present

## 2018-07-11 DIAGNOSIS — I129 Hypertensive chronic kidney disease with stage 1 through stage 4 chronic kidney disease, or unspecified chronic kidney disease: Secondary | ICD-10-CM | POA: Diagnosis not present

## 2018-07-11 DIAGNOSIS — I4811 Longstanding persistent atrial fibrillation: Secondary | ICD-10-CM

## 2018-07-11 DIAGNOSIS — L89312 Pressure ulcer of right buttock, stage 2: Secondary | ICD-10-CM | POA: Diagnosis not present

## 2018-07-11 DIAGNOSIS — I447 Left bundle-branch block, unspecified: Secondary | ICD-10-CM | POA: Diagnosis not present

## 2018-07-11 DIAGNOSIS — Z9181 History of falling: Secondary | ICD-10-CM | POA: Diagnosis not present

## 2018-07-11 DIAGNOSIS — D631 Anemia in chronic kidney disease: Secondary | ICD-10-CM | POA: Diagnosis not present

## 2018-07-11 DIAGNOSIS — Z23 Encounter for immunization: Secondary | ICD-10-CM | POA: Diagnosis not present

## 2018-07-11 DIAGNOSIS — I5023 Acute on chronic systolic (congestive) heart failure: Secondary | ICD-10-CM | POA: Diagnosis not present

## 2018-07-11 DIAGNOSIS — I2510 Atherosclerotic heart disease of native coronary artery without angina pectoris: Secondary | ICD-10-CM | POA: Diagnosis not present

## 2018-07-11 DIAGNOSIS — D696 Thrombocytopenia, unspecified: Secondary | ICD-10-CM

## 2018-07-11 LAB — CUP PACEART REMOTE DEVICE CHECK
Brady Statistic AP VP Percent: 16.64 %
Brady Statistic AP VS Percent: 0.14 %
Brady Statistic AS VP Percent: 71.39 %
Brady Statistic AS VS Percent: 11.82 %
Brady Statistic RA Percent Paced: 14.76 %
Brady Statistic RV Percent Paced: 86.4 %
HighPow Impedance: 38 Ohm
HighPow Impedance: 47 Ohm
Implantable Lead Implant Date: 20080806
Implantable Lead Implant Date: 20080806
Implantable Lead Implant Date: 20080806
Implantable Lead Location: 753858
Implantable Lead Location: 753859
Implantable Lead Location: 753860
Implantable Lead Model: 4076
Implantable Lead Model: 6947
Implantable Pulse Generator Implant Date: 20171115
Lead Channel Impedance Value: 323 Ohm
Lead Channel Impedance Value: 380 Ohm
Lead Channel Impedance Value: 399 Ohm
Lead Channel Impedance Value: 437 Ohm
Lead Channel Impedance Value: 437 Ohm
Lead Channel Impedance Value: 779 Ohm
Lead Channel Pacing Threshold Amplitude: 0.875 V
Lead Channel Pacing Threshold Pulse Width: 0.4 ms
Lead Channel Sensing Intrinsic Amplitude: 1 mV
Lead Channel Sensing Intrinsic Amplitude: 1 mV
Lead Channel Sensing Intrinsic Amplitude: 5.25 mV
Lead Channel Setting Pacing Amplitude: 2 V
Lead Channel Setting Pacing Amplitude: 2 V
Lead Channel Setting Pacing Amplitude: 2 V
Lead Channel Setting Pacing Pulse Width: 0.4 ms
Lead Channel Setting Pacing Pulse Width: 0.4 ms
Lead Channel Setting Sensing Sensitivity: 0.3 mV
MDC IDC MSMT BATTERY REMAINING LONGEVITY: 64 mo
MDC IDC MSMT BATTERY VOLTAGE: 2.98 V
MDC IDC MSMT LEADCHNL RV SENSING INTR AMPL: 5.25 mV
MDC IDC SESS DTM: 20191127062303

## 2018-07-11 MED ORDER — MIDODRINE HCL 5 MG PO TABS: 5.0000 mg | ORAL_TABLET | Freq: Three times a day (TID) | ORAL | 3 refills | Status: AC

## 2018-07-11 MED ORDER — MIDODRINE HCL 5 MG PO TABS: 5.0000 mg | ORAL_TABLET | Freq: Three times a day (TID) | ORAL | 1 refills | Status: DC

## 2018-07-11 NOTE — Telephone Encounter (Signed)
Medical Clarification Request for Midodrine 5mg  faxed back to [facility]

## 2018-07-11 NOTE — Progress Notes (Signed)
[name] [name] is a 85 y.o. male who presents to [name]: Primary Care Sports Medicine today for follow-up right groin pain/hernia, hypertension/heart disease.  Additionally following up diabetes.  [name] [name] has a history of type 2 diabetes.  This previously was controlled with sulfonylureas.  However he was switched to Jardiance due to his heart failure.  This medication was available through manufacturer assistance program.  He started taking the medicine a few weeks ago.  He notes he tolerates it well.  He notes his blood sugars are typically 100-150.  He has had a little bit of weight loss with this about 6 pounds.  He notes he has a little bit of less leg swelling with this medication as well.    Additionally [name] [name] has a history of heart failure with multiple different heart etiologies such as coronary artery disease and atrial fibrillation.  He was seen recently by Dr. Shirlee Latch for heart failure.  His metabolic panel was checked and was stable.  He takes medications listed below and feels pretty well with how things are going.  Right groin pain/hernia: [name] [name] was found to have a small inguinal hernia on the right side recently.  Has had consultation with general surgery.  At that visit the hernia was small and not particularly painful.  Given his significant medical comorbid factors a joint decision was made for watchful waiting.  In the interim [name] [name] notes that his hernia has not enlarged.  He is having some pain in his groin especially with physical therapy and hip motion.  He denies any swelling or bulging into his scrotum.  No fevers chills nausea vomiting or diarrhea.   ROS as above:  Exam:  BP 108/73   Pulse 84   Wt 186 lb (84.4 kg)   BMI 25.23 kg/m  Wt Readings from Last 5 Encounters:  07/11/18 186 lb (84.4 kg)  07/08/18 186 lb 3.2 oz (84.5 kg)  06/19/18 192 lb 1.3 oz (87.1 kg)    05/20/18 193 lb (87.5 kg)  05/15/18 190 lb 6.4 oz (86.4 kg)    Gen: Well NAD HEENT: EOMI,  MMM Lungs: Normal work of breathing. CTABL Heart: RRR no MRG Abd: NABS, Soft. Nondistended, Nontender Exts: Brisk capillary refill, warm and well perfused.  Right hip normal-appearing not particularly tender.  Decreased flexion range of motion and internal rotation range of motion with pain. Resisted hip flexion strength is improved 4/5.  Intact abduction and adduction and hip strength.   Lab and Radiology Results   Chemistry      Component Value Date/Time   NA 141 07/08/2018 1243   NA 142 11/05/2016 1221   K 4.0 07/08/2018 1243   CL 101 07/08/2018 1243   CO2 28 07/08/2018 1243   BUN 21 07/08/2018 1243   BUN 29 (H) 11/05/2016 1221   CREATININE 1.17 07/08/2018 1243   CREATININE 1.26 (H) 02/03/2018 1433      Component Value Date/Time   CALCIUM 8.9 07/08/2018 1243   ALKPHOS 112 07/09/2017 1229   AST 13 02/03/2018 1433   ALT 11 02/03/2018 1433   BILITOT 0.6 02/03/2018 1433   BILITOT 0.7 11/05/2016 1221     Lab Results  Component Value Date   INR 2.1 06/19/2018   INR 1.9 (H) 04/22/2018   INR 1.52 04/07/2018   Lab Results  Component Value Date   WBC 6.2 04/07/2018   HGB 12.9 (L) 04/07/2018   HCT 40.9 04/07/2018   MCV 95.1  04/07/2018   PLT 156 04/07/2018     X-ray images right hip CT scan March 2019 personally independently reviewed. Images show moderate degenerative changes right femoral acetabular joint.    Assessment and Plan: 85 y.o. male with  Diabetes: Doing reasonably well.  Continue Jardiance.  Recheck 1 month.  Weight loss likely due to bit of diuresis.  Discussed diabetes nutrition.  Hypertension/heart disease: Doing well.  Continue current regimen.  Continue comanagement with cardiology.  Check INR next visit.  Some concern for amyloidosis with cardiology.  Reasonable to pursue further work-up if needed.  Additionally reasonable to continue warfarin despite  mild thrombocytopenia seen on labs previously.  Hip pain: I believe the groin pain is more likely due to intra-articular etiology.  Patient had CT scan March 2019 that showed degenerative changes in his right hip.  I believe the majority of his pain is more likely related to interarticular hip pain or hip flexor tendinitis.  Some of his pain may be due to the hernia however I am not certain that is the case.  He does not have any worsening in the last few weeks. Plan to continue with home health physical therapy and recheck in 1 month.  Return sooner if needed.    Prevnar 13 pneumonia vaccine given today prior to discharge.   Orders Placed This Encounter  Procedures  . Pneumococcal conjugate vaccine 13-valent   No orders of the defined types were placed in this encounter.    Historical information moved to improve visibility of documentation.  Past Medical History:  Diagnosis Date  . Anemia, unspecified 04/07/2013  . Atrial fibrillation (HCC)    fibrillation/flutter  . BPH (benign prostatic hypertrophy)   . Chronic systolic CHF (congestive heart failure) (HCC)    Nonischemic DCM EF 10% s/p BiV AICD  . Coronary artery disease 2008   nonobstructive ASCAD  . Depression   . Hyperlipidemia   . Hypertension   . Hypothyroidism   . Left bundle branch block   . Nonischemic cardiomyopathy (HCC)    ef 10 %  . OSA (obstructive sleep apnea)    intolerant to CPAP  . Osteoarthritis   . Sleep apnea    stopped using CPAP  . Thrombocytopenia (HCC)    Past Surgical History:  Procedure Laterality Date  . CARDIAC DEFIBRILLATOR PLACEMENT     left chest  . CARDIOVERSION N/A 12/29/2012   Procedure: CARDIOVERSION;  Surgeon: Quintella Reichert, MD;  Location: [facility];  Service: Cardiovascular;  Laterality: N/A;  . CHOLECYSTECTOMY    . Colonscopy     reportedly negative per pt; around 2005.    . EP IMPLANTABLE DEVICE N/A 05/09/2016   Procedure: BIVI ICD Generator Changeout;  Surgeon: Duke Salvia, MD;  Location: [facility] INVASIVE CV LAB;  Service: Cardiovascular;  Laterality: N/A;  . TRANSURETHRAL RESECTION OF PROSTATE     for BPH   Social History   Tobacco Use  . Smoking status: Never Smoker  . Smokeless tobacco: Never Used  Substance Use Topics  . Alcohol use: No   family history includes Cancer in his maternal uncle; Heart disease in his father; Heart failure in his mother; Stroke in his mother.  Medications: Current Outpatient Medications  Medication Sig Dispense Refill  . acetaminophen (TYLENOL) 650 MG CR tablet Take 1 tablet (650 mg total) by mouth every 8 (eight) hours as needed for pain. 90 tablet 3  . AMBULATORY NON FORMULARY MEDICATION Lancets pen type sufficient for three times daily checking.  Dispense 3 month supply E11.65 1 each 12  . AMBULATORY NON FORMULARY MEDICATION Test strips for One Touch Verio Flex meter.  Test 3x daily.  Disp 3 month supply.  E11.65 1 each 12  . AMBULATORY NON FORMULARY MEDICATION Chair cushion (donut). Dx: P82.423 Fax: [phone] ([facility]) 1 each 1  . AMBULATORY NON FORMULARY MEDICATION Rolling [place] with seat.  Disp 1.  Use as needed 1 each 0  . carbidopa-levodopa (SINEMET) 25-100 MG tablet Take 1 tablet by mouth 3 (three) times daily.    . diclofenac sodium (VOLTAREN) 1 % GEL Apply 4 g topically 4 (four) times daily. To affected joint. 1440 g 3  . digoxin (LANOXIN) 0.125 MG tablet TAKE 1 TABLET BY MOUTH  EVERY OTHER DAY 45 tablet 3  . empagliflozin (JARDIANCE) 10 MG TABS tablet Take 10 mg by mouth daily. 90 tablet 1  . levothyroxine (SYNTHROID, LEVOTHROID) 137 MCG tablet TAKE 1 TABLET BY MOUTH  DAILY BEFORE BREAKFAST 90 tablet 1  . midodrine (PROAMATINE) 5 MG tablet Take 1 tablet (5 mg total) by mouth 3 (three) times daily with meals. 90 tablet 1  . mupirocin ointment (BACTROBAN) 2 % Apply to affected area BID for 7 days. 30 g 3  . nystatin cream (MYCOSTATIN) Apply 1 application topically 2 (two) times daily. 60 g 2  .  Nystatin POWD Apply liberally to affected area 2 times per day 1 Bottle 11  . potassium chloride SA (K-DUR,KLOR-CON) 20 MEQ tablet Take 1 tablet (20 mEq total) by mouth daily. 90 tablet 3  . simvastatin (ZOCOR) 10 MG tablet TAKE 1 TABLET BY MOUTH  EVERY EVENING 90 tablet 3  . torsemide (DEMADEX) 20 MG tablet Take 1 tablet (20 mg total) by mouth daily. 30 tablet 0  . warfarin (COUMADIN) 1 MG tablet Take 1 tablet by mouth 4 days a week as directed. (IN ADDITION  TO 5 MG TABLET) 48 tablet 1  . warfarin (COUMADIN) 5 MG tablet Take 1 tablet (5 mg total) by mouth daily. 90 tablet 3   No current facility-administered medications for this visit.    Allergies  Allergen Reactions  . Amiodarone Other (See Comments)    Neuro toxicity     Discussed warning signs or symptoms. Please see discharge instructions. Patient expresses understanding.

## 2018-07-11 NOTE — Patient Instructions (Addendum)
Thank you for coming in today. Continue home health physical therapy.  Continue current medicines.  Recheck in 1 month as scheduled.

## 2018-07-14 DIAGNOSIS — M1612 Unilateral primary osteoarthritis, left hip: Secondary | ICD-10-CM | POA: Diagnosis not present

## 2018-07-14 DIAGNOSIS — M4807 Spinal stenosis, lumbosacral region: Secondary | ICD-10-CM | POA: Diagnosis not present

## 2018-07-14 DIAGNOSIS — I130 Hypertensive heart and chronic kidney disease with heart failure and stage 1 through stage 4 chronic kidney disease, or unspecified chronic kidney disease: Secondary | ICD-10-CM | POA: Diagnosis not present

## 2018-07-14 DIAGNOSIS — Z95810 Presence of automatic (implantable) cardiac defibrillator: Secondary | ICD-10-CM | POA: Diagnosis not present

## 2018-07-14 DIAGNOSIS — I4819 Other persistent atrial fibrillation: Secondary | ICD-10-CM | POA: Diagnosis not present

## 2018-07-14 DIAGNOSIS — I2510 Atherosclerotic heart disease of native coronary artery without angina pectoris: Secondary | ICD-10-CM | POA: Diagnosis not present

## 2018-07-14 DIAGNOSIS — M170 Bilateral primary osteoarthritis of knee: Secondary | ICD-10-CM | POA: Diagnosis not present

## 2018-07-14 DIAGNOSIS — G4733 Obstructive sleep apnea (adult) (pediatric): Secondary | ICD-10-CM | POA: Diagnosis not present

## 2018-07-14 DIAGNOSIS — L89312 Pressure ulcer of right buttock, stage 2: Secondary | ICD-10-CM | POA: Diagnosis not present

## 2018-07-14 DIAGNOSIS — E039 Hypothyroidism, unspecified: Secondary | ICD-10-CM | POA: Diagnosis not present

## 2018-07-14 DIAGNOSIS — D631 Anemia in chronic kidney disease: Secondary | ICD-10-CM | POA: Diagnosis not present

## 2018-07-14 DIAGNOSIS — N183 Chronic kidney disease, stage 3 (moderate): Secondary | ICD-10-CM | POA: Diagnosis not present

## 2018-07-14 DIAGNOSIS — Z9181 History of falling: Secondary | ICD-10-CM | POA: Diagnosis not present

## 2018-07-14 DIAGNOSIS — Z7984 Long term (current) use of oral hypoglycemic drugs: Secondary | ICD-10-CM | POA: Diagnosis not present

## 2018-07-14 DIAGNOSIS — I5023 Acute on chronic systolic (congestive) heart failure: Secondary | ICD-10-CM | POA: Diagnosis not present

## 2018-07-14 DIAGNOSIS — I447 Left bundle-branch block, unspecified: Secondary | ICD-10-CM | POA: Diagnosis not present

## 2018-07-14 DIAGNOSIS — Z7901 Long term (current) use of anticoagulants: Secondary | ICD-10-CM | POA: Diagnosis not present

## 2018-07-14 DIAGNOSIS — E1122 Type 2 diabetes mellitus with diabetic chronic kidney disease: Secondary | ICD-10-CM | POA: Diagnosis not present

## 2018-07-14 DIAGNOSIS — I420 Dilated cardiomyopathy: Secondary | ICD-10-CM | POA: Diagnosis not present

## 2018-07-15 DIAGNOSIS — I4819 Other persistent atrial fibrillation: Secondary | ICD-10-CM | POA: Diagnosis not present

## 2018-07-15 DIAGNOSIS — Z95810 Presence of automatic (implantable) cardiac defibrillator: Secondary | ICD-10-CM | POA: Diagnosis not present

## 2018-07-15 DIAGNOSIS — E1122 Type 2 diabetes mellitus with diabetic chronic kidney disease: Secondary | ICD-10-CM | POA: Diagnosis not present

## 2018-07-15 DIAGNOSIS — N183 Chronic kidney disease, stage 3 (moderate): Secondary | ICD-10-CM | POA: Diagnosis not present

## 2018-07-15 DIAGNOSIS — I447 Left bundle-branch block, unspecified: Secondary | ICD-10-CM | POA: Diagnosis not present

## 2018-07-15 DIAGNOSIS — G4733 Obstructive sleep apnea (adult) (pediatric): Secondary | ICD-10-CM | POA: Diagnosis not present

## 2018-07-15 DIAGNOSIS — M1612 Unilateral primary osteoarthritis, left hip: Secondary | ICD-10-CM | POA: Diagnosis not present

## 2018-07-15 DIAGNOSIS — I2510 Atherosclerotic heart disease of native coronary artery without angina pectoris: Secondary | ICD-10-CM | POA: Diagnosis not present

## 2018-07-15 DIAGNOSIS — Z7984 Long term (current) use of oral hypoglycemic drugs: Secondary | ICD-10-CM | POA: Diagnosis not present

## 2018-07-15 DIAGNOSIS — I130 Hypertensive heart and chronic kidney disease with heart failure and stage 1 through stage 4 chronic kidney disease, or unspecified chronic kidney disease: Secondary | ICD-10-CM | POA: Diagnosis not present

## 2018-07-15 DIAGNOSIS — Z7901 Long term (current) use of anticoagulants: Secondary | ICD-10-CM | POA: Diagnosis not present

## 2018-07-15 DIAGNOSIS — E039 Hypothyroidism, unspecified: Secondary | ICD-10-CM | POA: Diagnosis not present

## 2018-07-15 DIAGNOSIS — M4807 Spinal stenosis, lumbosacral region: Secondary | ICD-10-CM | POA: Diagnosis not present

## 2018-07-15 DIAGNOSIS — M170 Bilateral primary osteoarthritis of knee: Secondary | ICD-10-CM | POA: Diagnosis not present

## 2018-07-15 DIAGNOSIS — Z9181 History of falling: Secondary | ICD-10-CM | POA: Diagnosis not present

## 2018-07-15 DIAGNOSIS — D631 Anemia in chronic kidney disease: Secondary | ICD-10-CM | POA: Diagnosis not present

## 2018-07-15 DIAGNOSIS — I420 Dilated cardiomyopathy: Secondary | ICD-10-CM | POA: Diagnosis not present

## 2018-07-15 DIAGNOSIS — I5023 Acute on chronic systolic (congestive) heart failure: Secondary | ICD-10-CM | POA: Diagnosis not present

## 2018-07-15 DIAGNOSIS — L89312 Pressure ulcer of right buttock, stage 2: Secondary | ICD-10-CM | POA: Diagnosis not present

## 2018-07-17 DIAGNOSIS — M4807 Spinal stenosis, lumbosacral region: Secondary | ICD-10-CM | POA: Diagnosis not present

## 2018-07-17 DIAGNOSIS — Z95810 Presence of automatic (implantable) cardiac defibrillator: Secondary | ICD-10-CM | POA: Diagnosis not present

## 2018-07-17 DIAGNOSIS — E039 Hypothyroidism, unspecified: Secondary | ICD-10-CM | POA: Diagnosis not present

## 2018-07-17 DIAGNOSIS — I420 Dilated cardiomyopathy: Secondary | ICD-10-CM | POA: Diagnosis not present

## 2018-07-17 DIAGNOSIS — I447 Left bundle-branch block, unspecified: Secondary | ICD-10-CM | POA: Diagnosis not present

## 2018-07-17 DIAGNOSIS — D631 Anemia in chronic kidney disease: Secondary | ICD-10-CM | POA: Diagnosis not present

## 2018-07-17 DIAGNOSIS — L89312 Pressure ulcer of right buttock, stage 2: Secondary | ICD-10-CM | POA: Diagnosis not present

## 2018-07-17 DIAGNOSIS — G4733 Obstructive sleep apnea (adult) (pediatric): Secondary | ICD-10-CM | POA: Diagnosis not present

## 2018-07-17 DIAGNOSIS — I5023 Acute on chronic systolic (congestive) heart failure: Secondary | ICD-10-CM | POA: Diagnosis not present

## 2018-07-17 DIAGNOSIS — M1612 Unilateral primary osteoarthritis, left hip: Secondary | ICD-10-CM | POA: Diagnosis not present

## 2018-07-17 DIAGNOSIS — Z9181 History of falling: Secondary | ICD-10-CM | POA: Diagnosis not present

## 2018-07-17 DIAGNOSIS — I2510 Atherosclerotic heart disease of native coronary artery without angina pectoris: Secondary | ICD-10-CM | POA: Diagnosis not present

## 2018-07-17 DIAGNOSIS — I4819 Other persistent atrial fibrillation: Secondary | ICD-10-CM | POA: Diagnosis not present

## 2018-07-17 DIAGNOSIS — I130 Hypertensive heart and chronic kidney disease with heart failure and stage 1 through stage 4 chronic kidney disease, or unspecified chronic kidney disease: Secondary | ICD-10-CM | POA: Diagnosis not present

## 2018-07-17 DIAGNOSIS — N183 Chronic kidney disease, stage 3 (moderate): Secondary | ICD-10-CM | POA: Diagnosis not present

## 2018-07-17 DIAGNOSIS — E1122 Type 2 diabetes mellitus with diabetic chronic kidney disease: Secondary | ICD-10-CM | POA: Diagnosis not present

## 2018-07-17 DIAGNOSIS — Z7901 Long term (current) use of anticoagulants: Secondary | ICD-10-CM | POA: Diagnosis not present

## 2018-07-17 DIAGNOSIS — M170 Bilateral primary osteoarthritis of knee: Secondary | ICD-10-CM | POA: Diagnosis not present

## 2018-07-17 DIAGNOSIS — Z7984 Long term (current) use of oral hypoglycemic drugs: Secondary | ICD-10-CM | POA: Diagnosis not present

## 2018-07-18 ENCOUNTER — Ambulatory Visit: Payer: Medicare Other | Admitting: Family Medicine

## 2018-07-18 DIAGNOSIS — Z95810 Presence of automatic (implantable) cardiac defibrillator: Secondary | ICD-10-CM | POA: Diagnosis not present

## 2018-07-18 DIAGNOSIS — I420 Dilated cardiomyopathy: Secondary | ICD-10-CM | POA: Diagnosis not present

## 2018-07-18 DIAGNOSIS — Z7984 Long term (current) use of oral hypoglycemic drugs: Secondary | ICD-10-CM | POA: Diagnosis not present

## 2018-07-18 DIAGNOSIS — L89312 Pressure ulcer of right buttock, stage 2: Secondary | ICD-10-CM | POA: Diagnosis not present

## 2018-07-18 DIAGNOSIS — I2510 Atherosclerotic heart disease of native coronary artery without angina pectoris: Secondary | ICD-10-CM | POA: Diagnosis not present

## 2018-07-18 DIAGNOSIS — Z7901 Long term (current) use of anticoagulants: Secondary | ICD-10-CM | POA: Diagnosis not present

## 2018-07-18 DIAGNOSIS — I5023 Acute on chronic systolic (congestive) heart failure: Secondary | ICD-10-CM | POA: Diagnosis not present

## 2018-07-18 DIAGNOSIS — E039 Hypothyroidism, unspecified: Secondary | ICD-10-CM | POA: Diagnosis not present

## 2018-07-18 DIAGNOSIS — I130 Hypertensive heart and chronic kidney disease with heart failure and stage 1 through stage 4 chronic kidney disease, or unspecified chronic kidney disease: Secondary | ICD-10-CM | POA: Diagnosis not present

## 2018-07-18 DIAGNOSIS — D631 Anemia in chronic kidney disease: Secondary | ICD-10-CM | POA: Diagnosis not present

## 2018-07-18 DIAGNOSIS — M4807 Spinal stenosis, lumbosacral region: Secondary | ICD-10-CM | POA: Diagnosis not present

## 2018-07-18 DIAGNOSIS — E1122 Type 2 diabetes mellitus with diabetic chronic kidney disease: Secondary | ICD-10-CM | POA: Diagnosis not present

## 2018-07-18 DIAGNOSIS — G4733 Obstructive sleep apnea (adult) (pediatric): Secondary | ICD-10-CM | POA: Diagnosis not present

## 2018-07-18 DIAGNOSIS — I4819 Other persistent atrial fibrillation: Secondary | ICD-10-CM | POA: Diagnosis not present

## 2018-07-18 DIAGNOSIS — M170 Bilateral primary osteoarthritis of knee: Secondary | ICD-10-CM | POA: Diagnosis not present

## 2018-07-18 DIAGNOSIS — N183 Chronic kidney disease, stage 3 (moderate): Secondary | ICD-10-CM | POA: Diagnosis not present

## 2018-07-18 DIAGNOSIS — Z9181 History of falling: Secondary | ICD-10-CM | POA: Diagnosis not present

## 2018-07-18 DIAGNOSIS — M1612 Unilateral primary osteoarthritis, left hip: Secondary | ICD-10-CM | POA: Diagnosis not present

## 2018-07-18 DIAGNOSIS — I447 Left bundle-branch block, unspecified: Secondary | ICD-10-CM | POA: Diagnosis not present

## 2018-07-21 DIAGNOSIS — D631 Anemia in chronic kidney disease: Secondary | ICD-10-CM | POA: Diagnosis not present

## 2018-07-21 DIAGNOSIS — E039 Hypothyroidism, unspecified: Secondary | ICD-10-CM | POA: Diagnosis not present

## 2018-07-21 DIAGNOSIS — L89312 Pressure ulcer of right buttock, stage 2: Secondary | ICD-10-CM | POA: Diagnosis not present

## 2018-07-21 DIAGNOSIS — I5023 Acute on chronic systolic (congestive) heart failure: Secondary | ICD-10-CM | POA: Diagnosis not present

## 2018-07-21 DIAGNOSIS — Z7901 Long term (current) use of anticoagulants: Secondary | ICD-10-CM | POA: Diagnosis not present

## 2018-07-21 DIAGNOSIS — Z9181 History of falling: Secondary | ICD-10-CM | POA: Diagnosis not present

## 2018-07-21 DIAGNOSIS — E1122 Type 2 diabetes mellitus with diabetic chronic kidney disease: Secondary | ICD-10-CM | POA: Diagnosis not present

## 2018-07-21 DIAGNOSIS — I2510 Atherosclerotic heart disease of native coronary artery without angina pectoris: Secondary | ICD-10-CM | POA: Diagnosis not present

## 2018-07-21 DIAGNOSIS — I4819 Other persistent atrial fibrillation: Secondary | ICD-10-CM | POA: Diagnosis not present

## 2018-07-21 DIAGNOSIS — I130 Hypertensive heart and chronic kidney disease with heart failure and stage 1 through stage 4 chronic kidney disease, or unspecified chronic kidney disease: Secondary | ICD-10-CM | POA: Diagnosis not present

## 2018-07-21 DIAGNOSIS — M4807 Spinal stenosis, lumbosacral region: Secondary | ICD-10-CM | POA: Diagnosis not present

## 2018-07-21 DIAGNOSIS — N183 Chronic kidney disease, stage 3 (moderate): Secondary | ICD-10-CM | POA: Diagnosis not present

## 2018-07-21 DIAGNOSIS — Z7984 Long term (current) use of oral hypoglycemic drugs: Secondary | ICD-10-CM | POA: Diagnosis not present

## 2018-07-21 DIAGNOSIS — M170 Bilateral primary osteoarthritis of knee: Secondary | ICD-10-CM | POA: Diagnosis not present

## 2018-07-21 DIAGNOSIS — I447 Left bundle-branch block, unspecified: Secondary | ICD-10-CM | POA: Diagnosis not present

## 2018-07-21 DIAGNOSIS — Z95810 Presence of automatic (implantable) cardiac defibrillator: Secondary | ICD-10-CM | POA: Diagnosis not present

## 2018-07-21 DIAGNOSIS — I420 Dilated cardiomyopathy: Secondary | ICD-10-CM | POA: Diagnosis not present

## 2018-07-21 DIAGNOSIS — M1612 Unilateral primary osteoarthritis, left hip: Secondary | ICD-10-CM | POA: Diagnosis not present

## 2018-07-21 DIAGNOSIS — G4733 Obstructive sleep apnea (adult) (pediatric): Secondary | ICD-10-CM | POA: Diagnosis not present

## 2018-07-22 DIAGNOSIS — M1612 Unilateral primary osteoarthritis, left hip: Secondary | ICD-10-CM | POA: Diagnosis not present

## 2018-07-22 DIAGNOSIS — Z9181 History of falling: Secondary | ICD-10-CM | POA: Diagnosis not present

## 2018-07-22 DIAGNOSIS — I447 Left bundle-branch block, unspecified: Secondary | ICD-10-CM | POA: Diagnosis not present

## 2018-07-22 DIAGNOSIS — D631 Anemia in chronic kidney disease: Secondary | ICD-10-CM | POA: Diagnosis not present

## 2018-07-22 DIAGNOSIS — Z7984 Long term (current) use of oral hypoglycemic drugs: Secondary | ICD-10-CM | POA: Diagnosis not present

## 2018-07-22 DIAGNOSIS — Z7901 Long term (current) use of anticoagulants: Secondary | ICD-10-CM | POA: Diagnosis not present

## 2018-07-22 DIAGNOSIS — I2510 Atherosclerotic heart disease of native coronary artery without angina pectoris: Secondary | ICD-10-CM | POA: Diagnosis not present

## 2018-07-22 DIAGNOSIS — I130 Hypertensive heart and chronic kidney disease with heart failure and stage 1 through stage 4 chronic kidney disease, or unspecified chronic kidney disease: Secondary | ICD-10-CM | POA: Diagnosis not present

## 2018-07-22 DIAGNOSIS — M4807 Spinal stenosis, lumbosacral region: Secondary | ICD-10-CM | POA: Diagnosis not present

## 2018-07-22 DIAGNOSIS — M170 Bilateral primary osteoarthritis of knee: Secondary | ICD-10-CM | POA: Diagnosis not present

## 2018-07-22 DIAGNOSIS — E1122 Type 2 diabetes mellitus with diabetic chronic kidney disease: Secondary | ICD-10-CM | POA: Diagnosis not present

## 2018-07-22 DIAGNOSIS — I5023 Acute on chronic systolic (congestive) heart failure: Secondary | ICD-10-CM | POA: Diagnosis not present

## 2018-07-22 DIAGNOSIS — E039 Hypothyroidism, unspecified: Secondary | ICD-10-CM | POA: Diagnosis not present

## 2018-07-22 DIAGNOSIS — N183 Chronic kidney disease, stage 3 (moderate): Secondary | ICD-10-CM | POA: Diagnosis not present

## 2018-07-22 DIAGNOSIS — G4733 Obstructive sleep apnea (adult) (pediatric): Secondary | ICD-10-CM | POA: Diagnosis not present

## 2018-07-22 DIAGNOSIS — I420 Dilated cardiomyopathy: Secondary | ICD-10-CM | POA: Diagnosis not present

## 2018-07-22 DIAGNOSIS — L89312 Pressure ulcer of right buttock, stage 2: Secondary | ICD-10-CM | POA: Diagnosis not present

## 2018-07-22 DIAGNOSIS — I4819 Other persistent atrial fibrillation: Secondary | ICD-10-CM | POA: Diagnosis not present

## 2018-07-22 DIAGNOSIS — Z95810 Presence of automatic (implantable) cardiac defibrillator: Secondary | ICD-10-CM | POA: Diagnosis not present

## 2018-07-25 DIAGNOSIS — I447 Left bundle-branch block, unspecified: Secondary | ICD-10-CM | POA: Diagnosis not present

## 2018-07-25 DIAGNOSIS — E039 Hypothyroidism, unspecified: Secondary | ICD-10-CM | POA: Diagnosis not present

## 2018-07-25 DIAGNOSIS — I420 Dilated cardiomyopathy: Secondary | ICD-10-CM | POA: Diagnosis not present

## 2018-07-25 DIAGNOSIS — D631 Anemia in chronic kidney disease: Secondary | ICD-10-CM | POA: Diagnosis not present

## 2018-07-25 DIAGNOSIS — Z95810 Presence of automatic (implantable) cardiac defibrillator: Secondary | ICD-10-CM | POA: Diagnosis not present

## 2018-07-25 DIAGNOSIS — N183 Chronic kidney disease, stage 3 (moderate): Secondary | ICD-10-CM | POA: Diagnosis not present

## 2018-07-25 DIAGNOSIS — I5023 Acute on chronic systolic (congestive) heart failure: Secondary | ICD-10-CM | POA: Diagnosis not present

## 2018-07-25 DIAGNOSIS — I4819 Other persistent atrial fibrillation: Secondary | ICD-10-CM | POA: Diagnosis not present

## 2018-07-25 DIAGNOSIS — M1612 Unilateral primary osteoarthritis, left hip: Secondary | ICD-10-CM | POA: Diagnosis not present

## 2018-07-25 DIAGNOSIS — Z9181 History of falling: Secondary | ICD-10-CM | POA: Diagnosis not present

## 2018-07-25 DIAGNOSIS — G4733 Obstructive sleep apnea (adult) (pediatric): Secondary | ICD-10-CM | POA: Diagnosis not present

## 2018-07-25 DIAGNOSIS — M4807 Spinal stenosis, lumbosacral region: Secondary | ICD-10-CM | POA: Diagnosis not present

## 2018-07-25 DIAGNOSIS — Z7901 Long term (current) use of anticoagulants: Secondary | ICD-10-CM | POA: Diagnosis not present

## 2018-07-25 DIAGNOSIS — Z7984 Long term (current) use of oral hypoglycemic drugs: Secondary | ICD-10-CM | POA: Diagnosis not present

## 2018-07-25 DIAGNOSIS — I130 Hypertensive heart and chronic kidney disease with heart failure and stage 1 through stage 4 chronic kidney disease, or unspecified chronic kidney disease: Secondary | ICD-10-CM | POA: Diagnosis not present

## 2018-07-25 DIAGNOSIS — L89312 Pressure ulcer of right buttock, stage 2: Secondary | ICD-10-CM | POA: Diagnosis not present

## 2018-07-25 DIAGNOSIS — M170 Bilateral primary osteoarthritis of knee: Secondary | ICD-10-CM | POA: Diagnosis not present

## 2018-07-25 DIAGNOSIS — I2510 Atherosclerotic heart disease of native coronary artery without angina pectoris: Secondary | ICD-10-CM | POA: Diagnosis not present

## 2018-07-25 DIAGNOSIS — E1122 Type 2 diabetes mellitus with diabetic chronic kidney disease: Secondary | ICD-10-CM | POA: Diagnosis not present

## 2018-08-08 ENCOUNTER — Telehealth: Payer: Self-pay

## 2018-08-08 ENCOUNTER — Telehealth: Payer: Self-pay | Admitting: Internal Medicine

## 2018-08-08 DIAGNOSIS — Z7901 Long term (current) use of anticoagulants: Secondary | ICD-10-CM | POA: Diagnosis not present

## 2018-08-08 DIAGNOSIS — R0789 Other chest pain: Secondary | ICD-10-CM | POA: Diagnosis not present

## 2018-08-08 DIAGNOSIS — Z888 Allergy status to other drugs, medicaments and biological substances status: Secondary | ICD-10-CM | POA: Diagnosis not present

## 2018-08-08 DIAGNOSIS — R079 Chest pain, unspecified: Secondary | ICD-10-CM | POA: Diagnosis not present

## 2018-08-08 DIAGNOSIS — R9431 Abnormal electrocardiogram [ECG] [EKG]: Secondary | ICD-10-CM | POA: Diagnosis not present

## 2018-08-08 DIAGNOSIS — I4510 Unspecified right bundle-branch block: Secondary | ICD-10-CM | POA: Diagnosis not present

## 2018-08-08 DIAGNOSIS — R7989 Other specified abnormal findings of blood chemistry: Secondary | ICD-10-CM | POA: Diagnosis not present

## 2018-08-08 DIAGNOSIS — E039 Hypothyroidism, unspecified: Secondary | ICD-10-CM | POA: Diagnosis not present

## 2018-08-08 DIAGNOSIS — Z95810 Presence of automatic (implantable) cardiac defibrillator: Secondary | ICD-10-CM | POA: Diagnosis not present

## 2018-08-08 DIAGNOSIS — Z79899 Other long term (current) drug therapy: Secondary | ICD-10-CM | POA: Diagnosis not present

## 2018-08-08 DIAGNOSIS — R918 Other nonspecific abnormal finding of lung field: Secondary | ICD-10-CM | POA: Diagnosis not present

## 2018-08-08 DIAGNOSIS — Z87891 Personal history of nicotine dependence: Secondary | ICD-10-CM | POA: Diagnosis not present

## 2018-08-08 DIAGNOSIS — Z7984 Long term (current) use of oral hypoglycemic drugs: Secondary | ICD-10-CM | POA: Diagnosis not present

## 2018-08-08 DIAGNOSIS — I517 Cardiomegaly: Secondary | ICD-10-CM | POA: Diagnosis not present

## 2018-08-08 DIAGNOSIS — I509 Heart failure, unspecified: Secondary | ICD-10-CM | POA: Diagnosis not present

## 2018-08-08 LAB — BASIC METABOLIC PANEL
BUN: 25 — AB (ref 4–21)
Creatinine: 1.1 (ref 0.6–1.3)
Glucose: 131
Potassium: 4.1 (ref 3.4–5.3)
Sodium: 141 (ref 137–147)

## 2018-08-08 LAB — POCT INR: INR: 1.9 — AB (ref 0.9–1.1)

## 2018-08-08 LAB — CBC AND DIFFERENTIAL: HEMOGLOBIN: 13.1 — AB (ref 13.5–17.5)

## 2018-08-08 NOTE — Telephone Encounter (Signed)
Scherrie Bateman, LPN will s/w pt.

## 2018-08-08 NOTE — Telephone Encounter (Signed)
[name] [name] called EMS because he had crushing chest pain. He was transported to [facility].

## 2018-08-08 NOTE — Telephone Encounter (Signed)
Per pt device did not fire pt sent in transmission .Per pt feels better at this time will call if has more symptoms ./cy

## 2018-08-08 NOTE — Telephone Encounter (Signed)
Pt wife states that his device went off twice this morning and went to the hospital. Pt wife states that he felt like an elephant was sitting on his chest. Pt wife did state as well that the Dr. York Spaniel it had something to do with his muscles and  Pt enzymes was off. She would like some education on what she should do in this kind of situation.

## 2018-08-08 NOTE — Telephone Encounter (Signed)
Late Entry: Spoke with pts wife this AM, informed her that pts transmission was normal for him. Pts wife concerned about going back to the ED if pt has chest pain again since she was told it was a muscular pain, informed pts wife that the policy that if pt is having Chest pain then pt should go to the ED and that if pt has CP again without a diagnostic evaluation a true coronary event could not be excluded pts wife voiced understanding.

## 2018-08-08 NOTE — Telephone Encounter (Signed)
New Message:     Pt's wife is calling and would like to speak to the nurse. She said she have patient in the ER at [facility] with chest pains and she wants to talk to a nurse asap please.

## 2018-08-18 ENCOUNTER — Encounter: Payer: Self-pay | Admitting: Family Medicine

## 2018-08-18 ENCOUNTER — Ambulatory Visit (INDEPENDENT_AMBULATORY_CARE_PROVIDER_SITE_OTHER): Payer: Medicare Other | Admitting: Family Medicine

## 2018-08-18 VITALS — BP 95/59 | HR 79 | Wt 183.0 lb

## 2018-08-18 DIAGNOSIS — Z7901 Long term (current) use of anticoagulants: Secondary | ICD-10-CM

## 2018-08-18 DIAGNOSIS — I10 Essential (primary) hypertension: Secondary | ICD-10-CM

## 2018-08-18 DIAGNOSIS — E1159 Type 2 diabetes mellitus with other circulatory complications: Secondary | ICD-10-CM | POA: Diagnosis not present

## 2018-08-18 DIAGNOSIS — Z95810 Presence of automatic (implantable) cardiac defibrillator: Secondary | ICD-10-CM

## 2018-08-18 DIAGNOSIS — I428 Other cardiomyopathies: Secondary | ICD-10-CM

## 2018-08-18 DIAGNOSIS — I4811 Longstanding persistent atrial fibrillation: Secondary | ICD-10-CM | POA: Diagnosis not present

## 2018-08-18 DIAGNOSIS — N183 Chronic kidney disease, stage 3 (moderate): Secondary | ICD-10-CM

## 2018-08-18 DIAGNOSIS — I25708 Atherosclerosis of coronary artery bypass graft(s), unspecified, with other forms of angina pectoris: Secondary | ICD-10-CM | POA: Diagnosis not present

## 2018-08-18 DIAGNOSIS — I152 Hypertension secondary to endocrine disorders: Secondary | ICD-10-CM

## 2018-08-18 DIAGNOSIS — E1122 Type 2 diabetes mellitus with diabetic chronic kidney disease: Secondary | ICD-10-CM

## 2018-08-18 NOTE — Progress Notes (Signed)
[name] [name] is a 85 y.o. male who presents to [name]: Primary Care Sports Medicine today for follow-up diabetes hypertension and episode of chest pain.  Patient experienced substernal nonradiating chest pain on February 14.  Pain was not particularly exertional and not associated with palpitations.  He took some Tylenol and called 911.  After about 30 minutes his pain had resolved in the ambulance crew arrived.  He was then taken to the emergency department where he had evaluation including troponins EKG CT angiogram of the chest.  Troponins were low and decreasing at 0.021 and then to 0.020.  EKG was nondiagnostic for myocardial infarction and CT angiogram of the chest did not show pulmonary embolus.  As he was asymptomatic he was discharged home.  The chest pain has not recurred.  He has a follow-up appointment scheduled with his cardiologist on March 1.  He continues taking medications listed below.  Danieljoseph has diabetes and is currently taking Jardiance for both his diabetes and his heart failure.  Blood sugars are typically 110 or so.  No low blood sugar episodes.  No significant urinary frequency or dysuria.  He has lost some weight on this medication.  He continues to eat a lower carbohydrate diet.  Additionally Datavion has heart failure and a history of hypertension.  He is no longer on any antihypertensives in fact taking midodrine.  He does not typically check his blood pressure.     ROS as above:  Exam:  BP (!) 95/59   Pulse 79   Wt 183 lb (83 kg)   BMI 24.82 kg/m  Wt Readings from Last 5 Encounters:  08/18/18 183 lb (83 kg)  07/11/18 186 lb (84.4 kg)  07/08/18 186 lb 3.2 oz (84.5 kg)  06/19/18 192 lb 1.3 oz (87.1 kg)  05/20/18 193 lb (87.5 kg)    Gen: Well NAD HEENT: EOMI,  MMM Lungs: Normal work of breathing. CTABL Heart: RRR no MRG Abd: NABS, Soft. Nondistended,  Nontender Exts: Brisk capillary refill, warm and well perfused.  No significant edema MSK: Some muscle wasting at face and hands bilaterally  Lab and Radiology Results No results found for this or any previous visit (from the past 72 hour(s)). No results found.    Assessment and Plan: 85 y.o. male with  Episode of chest pain: Unclear etiology.  He is high risk for heart related issues given his longstanding multiple cardiac problems.  His chest pain however resolved with Tylenol and a bit of time.  Regardless I think will benefit from further cardiac evaluation with his follow-up on March 1.  His wife states that she did record or download his device following the episode of chest pain.  Diabetes: Reasonably well controlled.  Patient will be due for A1c recheck next month.  However he is losing weight and I am concerned that he may be losing calories from [place] and becoming protein calorie malnourished.  May switch back to Jardiance in the future.  Blood pressure: A bit low today.  Again [place] may be a cause here.  May switch back to different diabetes medication.  Recheck 1 month.  PDMP not reviewed this encounter. Orders Placed This Encounter  Procedures  . CBC and differential    This external order was created through the Results Console.  . Basic metabolic panel    This external order was created through the Results Console.  Marland Kitchen POCT INR    This external order  was created through the Results Console.   No orders of the defined types were placed in this encounter.    Historical information moved to improve visibility of documentation.  Past Medical History:  Diagnosis Date  . Anemia, unspecified 04/07/2013  . Atrial fibrillation (HCC)    fibrillation/flutter  . BPH (benign prostatic hypertrophy)   . Chronic systolic CHF (congestive heart failure) (HCC)    Nonischemic DCM EF 10% s/p BiV AICD  . Coronary artery disease 2008   nonobstructive ASCAD  . Depression   .  Hyperlipidemia   . Hypertension   . Hypothyroidism   . Left bundle branch block   . Nonischemic cardiomyopathy (HCC)    ef 10 %  . OSA (obstructive sleep apnea)    intolerant to CPAP  . Osteoarthritis   . Sleep apnea    stopped using CPAP  . Thrombocytopenia (HCC)    Past Surgical History:  Procedure Laterality Date  . CARDIAC DEFIBRILLATOR PLACEMENT     left chest  . CARDIOVERSION N/A 12/29/2012   Procedure: CARDIOVERSION;  Surgeon: Quintella Reichert, MD;  Location: [facility];  Service: Cardiovascular;  Laterality: N/A;  . CHOLECYSTECTOMY    . Colonscopy     reportedly negative per pt; around 2005.    . EP IMPLANTABLE DEVICE N/A 05/09/2016   Procedure: BIVI ICD Generator Changeout;  Surgeon: Duke Salvia, MD;  Location: [facility] INVASIVE CV LAB;  Service: Cardiovascular;  Laterality: N/A;  . TRANSURETHRAL RESECTION OF PROSTATE     for BPH   Social History   Tobacco Use  . Smoking status: Never Smoker  . Smokeless tobacco: Never Used  Substance Use Topics  . Alcohol use: No   family history includes Cancer in his maternal uncle; Heart disease in his father; Heart failure in his mother; Stroke in his mother.  Medications: Current Outpatient Medications  Medication Sig Dispense Refill  . acetaminophen (TYLENOL) 650 MG CR tablet Take 1 tablet (650 mg total) by mouth every 8 (eight) hours as needed for pain. 90 tablet 3  . AMBULATORY NON FORMULARY MEDICATION Lancets pen type sufficient for three times daily checking. Dispense 3 month supply E11.65 1 each 12  . AMBULATORY NON FORMULARY MEDICATION Test strips for One Touch Verio Flex meter.  Test 3x daily.  Disp 3 month supply.  E11.65 1 each 12  . AMBULATORY NON FORMULARY MEDICATION Chair cushion (donut). Dx: S49.675 Fax: [phone] ([facility]) 1 each 1  . AMBULATORY NON FORMULARY MEDICATION Rolling [place] with seat.  Disp 1.  Use as needed 1 each 0  . carbidopa-levodopa (SINEMET) 25-100 MG tablet Take 1 tablet by mouth 3  (three) times daily.    . diclofenac sodium (VOLTAREN) 1 % GEL Apply 4 g topically 4 (four) times daily. To affected joint. 1440 g 3  . digoxin (LANOXIN) 0.125 MG tablet TAKE 1 TABLET BY MOUTH  EVERY OTHER DAY 45 tablet 3  . empagliflozin (JARDIANCE) 10 MG TABS tablet Take 10 mg by mouth daily. 90 tablet 1  . levothyroxine (SYNTHROID, LEVOTHROID) 137 MCG tablet TAKE 1 TABLET BY MOUTH  DAILY BEFORE BREAKFAST 90 tablet 1  . midodrine (PROAMATINE) 5 MG tablet Take 1 tablet (5 mg total) by mouth 3 (three) times daily with meals. 90 tablet 3  . mupirocin ointment (BACTROBAN) 2 % Apply to affected area BID for 7 days. 30 g 3  . nystatin cream (MYCOSTATIN) Apply 1 application topically 2 (two) times daily. 60 g 2  . Nystatin POWD Apply  liberally to affected area 2 times per day 1 Bottle 11  . potassium chloride SA (K-DUR,KLOR-CON) 20 MEQ tablet Take 1 tablet (20 mEq total) by mouth daily. 90 tablet 3  . simvastatin (ZOCOR) 10 MG tablet TAKE 1 TABLET BY MOUTH  EVERY EVENING 90 tablet 3  . torsemide (DEMADEX) 20 MG tablet Take 1 tablet (20 mg total) by mouth daily. 30 tablet 0  . warfarin (COUMADIN) 1 MG tablet Take 1 tablet by mouth 4 days a week as directed. (IN ADDITION  TO 5 MG TABLET) 48 tablet 1  . warfarin (COUMADIN) 5 MG tablet Take 1 tablet (5 mg total) by mouth daily. 90 tablet 3   No current facility-administered medications for this visit.    Allergies  Allergen Reactions  . Amiodarone Other (See Comments)    Neuro toxicity     Discussed warning signs or symptoms. Please see discharge instructions. Patient expresses understanding.

## 2018-08-18 NOTE — Patient Instructions (Addendum)
Thank you for coming in today.  Continue current medicines.  Follow up with Dr Graciela Husbands as scheduled on March 1st.  Recheck with me in about 1 month.   Ok to increase calories especially protein. We can tolerate a bit of carbs as well. Emphasis is on protein.

## 2018-08-20 ENCOUNTER — Ambulatory Visit (INDEPENDENT_AMBULATORY_CARE_PROVIDER_SITE_OTHER): Payer: Medicare Other | Admitting: *Deleted

## 2018-08-20 DIAGNOSIS — I428 Other cardiomyopathies: Secondary | ICD-10-CM

## 2018-08-21 LAB — CUP PACEART REMOTE DEVICE CHECK
Battery Remaining Longevity: 60 mo
Battery Voltage: 2.98 V
Brady Statistic AP VP Percent: 42.03 %
Brady Statistic AP VS Percent: 0.33 %
Brady Statistic AS VS Percent: 8.4 %
Brady Statistic RA Percent Paced: 34.21 %
Brady Statistic RV Percent Paced: 86.46 %
Date Time Interrogation Session: 20200226072704
HIGH POWER IMPEDANCE MEASURED VALUE: 48 Ohm
HighPow Impedance: 38 Ohm
Implantable Lead Implant Date: 20080806
Implantable Lead Implant Date: 20080806
Implantable Lead Implant Date: 20080806
Implantable Lead Location: 753858
Implantable Lead Location: 753859
Implantable Lead Location: 753860
Implantable Lead Model: 4076
Implantable Lead Model: 6947
Implantable Pulse Generator Implant Date: 20171115
Lead Channel Impedance Value: 323 Ohm
Lead Channel Impedance Value: 342 Ohm
Lead Channel Impedance Value: 513 Ohm
Lead Channel Impedance Value: 988 Ohm
Lead Channel Pacing Threshold Amplitude: 1 V
Lead Channel Sensing Intrinsic Amplitude: 0.375 mV
Lead Channel Sensing Intrinsic Amplitude: 0.375 mV
Lead Channel Sensing Intrinsic Amplitude: 5.25 mV
Lead Channel Sensing Intrinsic Amplitude: 5.25 mV
Lead Channel Setting Pacing Amplitude: 2 V
Lead Channel Setting Pacing Amplitude: 2 V
Lead Channel Setting Pacing Amplitude: 2 V
Lead Channel Setting Pacing Pulse Width: 0.4 ms
Lead Channel Setting Sensing Sensitivity: 0.3 mV
MDC IDC MSMT LEADCHNL LV IMPEDANCE VALUE: 570 Ohm
MDC IDC MSMT LEADCHNL RA IMPEDANCE VALUE: 380 Ohm
MDC IDC MSMT LEADCHNL RV PACING THRESHOLD PULSEWIDTH: 0.4 ms
MDC IDC SET LEADCHNL RV PACING PULSEWIDTH: 0.4 ms
MDC IDC STAT BRADY AS VP PERCENT: 49.25 %

## 2018-08-26 ENCOUNTER — Ambulatory Visit: Payer: Medicare Other | Admitting: Internal Medicine

## 2018-08-26 ENCOUNTER — Encounter: Payer: Medicare Other | Admitting: Physician Assistant

## 2018-08-26 ENCOUNTER — Encounter: Payer: Self-pay | Admitting: Internal Medicine

## 2018-08-26 VITALS — BP 90/62 | HR 81 | Ht 72.0 in | Wt 183.0 lb

## 2018-08-26 DIAGNOSIS — I447 Left bundle-branch block, unspecified: Secondary | ICD-10-CM | POA: Diagnosis not present

## 2018-08-26 DIAGNOSIS — Z95810 Presence of automatic (implantable) cardiac defibrillator: Secondary | ICD-10-CM

## 2018-08-26 DIAGNOSIS — I4819 Other persistent atrial fibrillation: Secondary | ICD-10-CM | POA: Diagnosis not present

## 2018-08-26 DIAGNOSIS — I5022 Chronic systolic (congestive) heart failure: Secondary | ICD-10-CM | POA: Diagnosis not present

## 2018-08-26 DIAGNOSIS — I428 Other cardiomyopathies: Secondary | ICD-10-CM | POA: Diagnosis not present

## 2018-08-26 MED ORDER — MAGNESIUM OXIDE 400 MG PO CAPS: 400.0000 mg | ORAL_CAPSULE | Freq: Every day | ORAL | 0 refills | Status: AC

## 2018-08-26 NOTE — Patient Instructions (Addendum)
mgMedication Instructions:  Your physician has recommended you make the following change in your medication:   Begin MgOx, 400mg  tablet, once daily.  Labwork: None ordered.  Testing/Procedures: None ordered.  Follow-Up: Your physician recommends that you schedule a follow-up appointment in:   One Year with Dr Graciela Husbands  Any Other Special Instructions Will Be Listed Below (If Applicable).  Please send in a remote transmission in one month so we can check your PVC count.  If you need a refill on your cardiac medications before your next appointment, please call your pharmacy.

## 2018-08-26 NOTE — Progress Notes (Signed)
Patient Care Team: Rodolph Bong, MD as PCP - General (Family Medicine) Quintella Reichert, MD as Consulting Physician (Cardiology)   HPI  [name] [name] is a 85 y.o. male Seen in followup for an ICD implanted for primary prevention in the setting of nonischemic cardiomyopathy identified and confirmed by catheterization in 2008. Because of symptoms of heart failure and left bundle branch block he underwent CRT-D implantation with a Medtronic device and a 6947 defibrillator lead. He had a lazerus-like effect.   He also has a history of atrial fibrillation For this he was treated with amiodarone. Surveillance labs   Normal  But he develped gait instability and med was stopped     DATE TEST EF   8/14 TTE    25-30 %   2/16 TTE 35-40 %   8/17 TTE  35%   5/18 Echo  35-40%   10/19 Echo  30-35% LAE (50/2.3/55)   Date Cr K Dig Hgb  1/19 1.34 4.3  13.4  8/19  1.26 4.2 <0.5 13.2  2/20 1.1 4.1  13.1   Patient had an episode of chest discomfort for which he was taken to [facility].  Described as midsternal without radiation heavy.  Lasted 20-30 minutes.  Troponin on arrival was 0.5.  It did not change.  He was discharged with a presumption of noncardiac chest pain.  Recalls that there was a thumping occurring at this time.  No recurrence  Has had some palpitations.      Past Medical History:  Diagnosis Date  . Anemia, unspecified 04/07/2013  . Atrial fibrillation (HCC)    fibrillation/flutter  . BPH (benign prostatic hypertrophy)   . Chronic systolic CHF (congestive heart failure) (HCC)    Nonischemic DCM EF 10% s/p BiV AICD  . Coronary artery disease 2008   nonobstructive ASCAD  . Depression   . Hyperlipidemia   . Hypertension   . Hypothyroidism   . Left bundle branch block   . Nonischemic cardiomyopathy (HCC)    ef 10 %  . OSA (obstructive sleep apnea)    intolerant to CPAP  . Osteoarthritis   . Sleep apnea    stopped using CPAP  . Thrombocytopenia (HCC)     Past  Surgical History:  Procedure Laterality Date  . CARDIAC DEFIBRILLATOR PLACEMENT     left chest  . CARDIOVERSION N/A 12/29/2012   Procedure: CARDIOVERSION;  Surgeon: Quintella Reichert, MD;  Location: [facility];  Service: Cardiovascular;  Laterality: N/A;  . CHOLECYSTECTOMY    . Colonscopy     reportedly negative per pt; around 2005.    . EP IMPLANTABLE DEVICE N/A 05/09/2016   Procedure: BIVI ICD Generator Changeout;  Surgeon: Duke Salvia, MD;  Location: [facility] INVASIVE CV LAB;  Service: Cardiovascular;  Laterality: N/A;  . TRANSURETHRAL RESECTION OF PROSTATE     for BPH    Current Outpatient Medications  Medication Sig Dispense Refill  . acetaminophen (TYLENOL) 650 MG CR tablet Take 1 tablet (650 mg total) by mouth every 8 (eight) hours as needed for pain. 90 tablet 3  . AMBULATORY NON FORMULARY MEDICATION Lancets pen type sufficient for three times daily checking. Dispense 3 month supply E11.65 1 each 12  . AMBULATORY NON FORMULARY MEDICATION Test strips for One Touch Verio Flex meter.  Test 3x daily.  Disp 3 month supply.  E11.65 1 each 12  . AMBULATORY NON FORMULARY MEDICATION Chair cushion (donut). Dx: E72.094 Fax: [phone] ([facility]) 1 each 1  .  AMBULATORY NON FORMULARY MEDICATION Rolling [place] with seat.  Disp 1.  Use as needed 1 each 0  . carbidopa-levodopa (SINEMET) 25-100 MG tablet Take 1 tablet by mouth 3 (three) times daily.    . diclofenac sodium (VOLTAREN) 1 % GEL Apply 4 g topically 4 (four) times daily. To affected joint. 1440 g 3  . digoxin (LANOXIN) 0.125 MG tablet TAKE 1 TABLET BY MOUTH  EVERY OTHER DAY 45 tablet 3  . empagliflozin (JARDIANCE) 10 MG TABS tablet Take 10 mg by mouth daily. 90 tablet 1  . levothyroxine (SYNTHROID, LEVOTHROID) 137 MCG tablet TAKE 1 TABLET BY MOUTH  DAILY BEFORE BREAKFAST 90 tablet 1  . midodrine (PROAMATINE) 5 MG tablet Take 1 tablet (5 mg total) by mouth 3 (three) times daily with meals. 90 tablet 3  . potassium chloride SA  (K-DUR,KLOR-CON) 20 MEQ tablet Take 1 tablet (20 mEq total) by mouth daily. 90 tablet 3  . simvastatin (ZOCOR) 10 MG tablet TAKE 1 TABLET BY MOUTH  EVERY EVENING 90 tablet 3  . torsemide (DEMADEX) 20 MG tablet Take 1 tablet (20 mg total) by mouth daily. 30 tablet 0  . warfarin (COUMADIN) 1 MG tablet Take 1 tablet by mouth 4 days a week as directed. (IN ADDITION  TO 5 MG TABLET) 48 tablet 1  . warfarin (COUMADIN) 5 MG tablet Take 1 tablet (5 mg total) by mouth daily. 90 tablet 3  . Magnesium Oxide 400 MG CAPS Take 1 capsule (400 mg total) by mouth daily. 90 capsule 0   No current facility-administered medications for this visit.     Allergies  Allergen Reactions  . Amiodarone Other (See Comments)    Neuro toxicity    Review of Systems negative except from HPI and PMH  Physical Exam BP 90/62   Pulse 81   Ht 6' (1.829 m)   Wt 183 lb (83 kg)   SpO2 97%   BMI 24.82 kg/m  Well developed and well nourished in no acute distress HENT normal Neck supple with JVP-flat Clear Device pocket well healed; without hematoma or erythema.  There is no tethering  Regular rate and rhythm,  gallop 2/6 murmur Abd-soft with active BS No Clubbing cyanosis tr edema Skin-warm and dry A & Oriented  Grossly normal sensory and motor function  ECG atrial fibrillation with underlying ventricular pacing with ventricular ectopics associated with triggered pacing and some fusion/pseudofusion  Assessment and  Plan  Nonischemic cardiomyopathy   Congestive heart failure-chronic-systolic   Atrial fibrillation-persistent  Atrial tachycardia  PVCs  Monitoring for high-risk medications-   Orthostatic intolerance  Implantable defibrillator-CRT-Medtronic  The patient's device was interrogated.  The information was reviewed. No changes were made in the programming.    PVC burden remains high with a V sensed response number going for about 7% over the last 18 months--12%.  Atrial fibrillation thought  previously to be permanent has had intermittent sinus rhythm.   Chest pain is likely noncardiac given the flat troponins and the known negative catheterization 12 years ago  I wonder whether the increased degree of ventricular ectopy with nonsustained ventricular tachycardia noted on the ECG today might be responsible.  Electrolytes were normal last month.  We will add magnesium.  He has hypotensive precluding the use of beta-blockers or other afterload  More than 50% of 40 min was spent in counseling related to the above

## 2018-08-27 ENCOUNTER — Encounter: Payer: Self-pay | Admitting: Cardiology

## 2018-08-27 ENCOUNTER — Telehealth: Payer: Self-pay

## 2018-08-27 MED ORDER — GLIMEPIRIDE 1 MG PO TABS: 1.0000 mg | ORAL_TABLET | Freq: Every day | ORAL | 1 refills | Status: DC

## 2018-08-27 NOTE — Telephone Encounter (Signed)
Delman saw Dr Graciela Husbands, cardiology, yesterday and he wants him off the Jardiance and back on the glimepiride. There is no mention in Dr Odessa Fleming office note about stopping the Jardiance.

## 2018-08-27 NOTE — Progress Notes (Signed)
Remote ICD transmission.   

## 2018-08-27 NOTE — Telephone Encounter (Signed)
Barbara notified. Verbalized understanding, no further questions.

## 2018-08-27 NOTE — Telephone Encounter (Signed)
Reasonable to discontinue Jardiance.  I sent 1 mg pill of glimepiride to [facility].  Take that daily and we can increase if needed.  I want to keep the dose low and increase up to avoid low blood sugar episodes.

## 2018-08-28 LAB — CUP PACEART INCLINIC DEVICE CHECK
Battery Remaining Longevity: 61 mo
Battery Voltage: 2.96 V
Brady Statistic AP VP Percent: 40.88 %
Brady Statistic AP VS Percent: 0.35 %
Brady Statistic AS VP Percent: 50.25 %
Brady Statistic RV Percent Paced: 86.94 %
Date Time Interrogation Session: 20200303203016
HIGH POWER IMPEDANCE MEASURED VALUE: 51 Ohm
HighPow Impedance: 40 Ohm
Implantable Lead Implant Date: 20080806
Implantable Lead Implant Date: 20080806
Implantable Lead Location: 753858
Implantable Lead Location: 753859
Implantable Lead Location: 753860
Implantable Lead Model: 4076
Implantable Lead Model: 6947
Implantable Pulse Generator Implant Date: 20171115
Lead Channel Impedance Value: 323 Ohm
Lead Channel Impedance Value: 380 Ohm
Lead Channel Impedance Value: 399 Ohm
Lead Channel Impedance Value: 551 Ohm
Lead Channel Impedance Value: 570 Ohm
Lead Channel Impedance Value: 988 Ohm
Lead Channel Pacing Threshold Amplitude: 0.75 V
Lead Channel Pacing Threshold Pulse Width: 0.4 ms
Lead Channel Sensing Intrinsic Amplitude: 0.375 mV
Lead Channel Sensing Intrinsic Amplitude: 0.75 mV
Lead Channel Sensing Intrinsic Amplitude: 5.25 mV
Lead Channel Sensing Intrinsic Amplitude: 9.75 mV
Lead Channel Setting Pacing Amplitude: 2 V
Lead Channel Setting Pacing Amplitude: 2 V
Lead Channel Setting Pacing Amplitude: 2 V
Lead Channel Setting Pacing Pulse Width: 0.4 ms
Lead Channel Setting Pacing Pulse Width: 0.4 ms
Lead Channel Setting Sensing Sensitivity: 0.3 mV
MDC IDC LEAD IMPLANT DT: 20080806
MDC IDC STAT BRADY AS VS PERCENT: 8.52 %
MDC IDC STAT BRADY RA PERCENT PACED: 34.17 %

## 2018-09-13 ENCOUNTER — Telehealth (INDEPENDENT_AMBULATORY_CARE_PROVIDER_SITE_OTHER): Payer: Medicare Other | Admitting: Physician Assistant

## 2018-09-13 DIAGNOSIS — K047 Periapical abscess without sinus: Secondary | ICD-10-CM

## 2018-09-13 MED ORDER — PENICILLIN V POTASSIUM 500 MG PO TABS: 500.0000 mg | ORAL_TABLET | Freq: Three times a day (TID) | ORAL | 0 refills | Status: DC

## 2018-09-13 NOTE — Telephone Encounter (Addendum)
Virtual Visit via Telephone Note  I connected with [name] [name] on 09/13/2018  by telephone and verified that I am speaking with the correct person using two identifiers.   I discussed the limitations, risks, security and privacy concerns of performing an evaluation and management service by telephone and the availability of in person appointments. I also discussed with the patient that there may be a patient responsible charge related to this service. The patient expressed understanding and agreed to proceed.   History of Present Illness                                                      [name] [name] is a 85 y.o. male with dental pain:  History is provided by wife and patient.  Sudden onset tooth pain beginning last night  Wife reports lower right molar appears cracked in multiple places Denies fever, chills, malaise Does not attend regular dental appointments    Past Medical History:  Diagnosis Date  . Anemia, unspecified 04/07/2013  . Atrial fibrillation (HCC)    fibrillation/flutter  . BPH (benign prostatic hypertrophy)   . Chronic systolic CHF (congestive heart failure) (HCC)    Nonischemic DCM EF 10% s/p BiV AICD  . Coronary artery disease 2008   nonobstructive ASCAD  . Depression   . Hyperlipidemia   . Hypertension   . Hypothyroidism   . Left bundle branch block   . Nonischemic cardiomyopathy (HCC)    ef 10 %  . OSA (obstructive sleep apnea)    intolerant to CPAP  . Osteoarthritis   . Sleep apnea    stopped using CPAP  . Thrombocytopenia (HCC)    Past Surgical History:  Procedure Laterality Date  . CARDIAC DEFIBRILLATOR PLACEMENT     left chest  . CARDIOVERSION N/A 12/29/2012   Procedure: CARDIOVERSION;  Surgeon: Quintella Reichert, MD;  Location: [facility];  Service: Cardiovascular;  Laterality: N/A;  . CHOLECYSTECTOMY    . Colonscopy     reportedly negative per pt; around 2005.    . EP IMPLANTABLE DEVICE N/A 05/09/2016   Procedure: BIVI ICD Generator  Changeout;  Surgeon: Duke Salvia, MD;  Location: [facility] INVASIVE CV LAB;  Service: Cardiovascular;  Laterality: N/A;  . TRANSURETHRAL RESECTION OF PROSTATE     for BPH   Social History   Tobacco Use  . Smoking status: Never Smoker  . Smokeless tobacco: Never Used  Substance Use Topics  . Alcohol use: No   family history includes Cancer in his maternal uncle; Heart disease in his father; Heart failure in his mother; Stroke in his mother.    ROS: negative except as noted in the HPI  Medications: Current Outpatient Medications  Medication Sig Dispense Refill  . acetaminophen (TYLENOL) 650 MG CR tablet Take 1 tablet (650 mg total) by mouth every 8 (eight) hours as needed for pain. 90 tablet 3  . AMBULATORY NON FORMULARY MEDICATION Lancets pen type sufficient for three times daily checking. Dispense 3 month supply E11.65 1 each 12  . AMBULATORY NON FORMULARY MEDICATION Test strips for One Touch Verio Flex meter.  Test 3x daily.  Disp 3 month supply.  E11.65 1 each 12  . AMBULATORY NON FORMULARY MEDICATION Chair cushion (donut). Dx: B51.025 Fax: [phone] ([facility]) 1 each 1  . AMBULATORY NON FORMULARY MEDICATION Rolling  [place] with seat.  Disp 1.  Use as needed 1 each 0  . carbidopa-levodopa (SINEMET) 25-100 MG tablet Take 1 tablet by mouth 3 (three) times daily.    . diclofenac sodium (VOLTAREN) 1 % GEL Apply 4 g topically 4 (four) times daily. To affected joint. 1440 g 3  . digoxin (LANOXIN) 0.125 MG tablet TAKE 1 TABLET BY MOUTH  EVERY OTHER DAY 45 tablet 3  . glimepiride (AMARYL) 1 MG tablet Take 1 tablet (1 mg total) by mouth daily with breakfast. 90 tablet 1  . levothyroxine (SYNTHROID, LEVOTHROID) 137 MCG tablet TAKE 1 TABLET BY MOUTH  DAILY BEFORE BREAKFAST 90 tablet 1  . Magnesium Oxide 400 MG CAPS Take 1 capsule (400 mg total) by mouth daily. 90 capsule 0  . midodrine (PROAMATINE) 5 MG tablet Take 1 tablet (5 mg total) by mouth 3 (three) times daily with meals. 90 tablet  3  . potassium chloride SA (K-DUR,KLOR-CON) 20 MEQ tablet Take 1 tablet (20 mEq total) by mouth daily. 90 tablet 3  . simvastatin (ZOCOR) 10 MG tablet TAKE 1 TABLET BY MOUTH  EVERY EVENING 90 tablet 3  . torsemide (DEMADEX) 20 MG tablet Take 1 tablet (20 mg total) by mouth daily. 30 tablet 0  . warfarin (COUMADIN) 1 MG tablet Take 1 tablet by mouth 4 days a week as directed. (IN ADDITION  TO 5 MG TABLET) 48 tablet 1  . warfarin (COUMADIN) 5 MG tablet Take 1 tablet (5 mg total) by mouth daily. 90 tablet 3   No current facility-administered medications for this visit.    Allergies  Allergen Reactions  . Amiodarone Other (See Comments)    Neuro toxicity   Physical Exam: Pulm: normal work of breathing, speaking in full sentences  Assessment and Plan: 85 y.o. male with   .[name] [name] was seen today for dental pain.  Diagnoses and all orders for this visit:  Dental infection -     penicillin v potassium (VEETID) 500 MG tablet; Take 1 tablet (500 mg total) by mouth 3 (three) times daily for 7 days.   Advised wife to start oral antibiotic and tylenol 1000 mg every 8 hours Contact dentist first thing Monday morning   Follow Up Instructions:    I discussed the assessment and treatment plan with the patient. The patient was provided an opportunity to ask questions and all were answered. The patient agreed with the plan and demonstrated an understanding of the instructions.   The patient was advised to call back or seek an in-person evaluation if the symptoms worsen or if the condition fails to improve as anticipated.  I provided 5-10 minutes of non-face-to-face time during this encounter.   Carlis Stable, [place]

## 2018-09-15 ENCOUNTER — Encounter: Payer: Self-pay | Admitting: Family Medicine

## 2018-09-15 ENCOUNTER — Ambulatory Visit (INDEPENDENT_AMBULATORY_CARE_PROVIDER_SITE_OTHER): Payer: Medicare Other | Admitting: Family Medicine

## 2018-09-15 VITALS — BP 99/46 | HR 84

## 2018-09-15 DIAGNOSIS — I10 Essential (primary) hypertension: Secondary | ICD-10-CM | POA: Diagnosis not present

## 2018-09-15 DIAGNOSIS — I4811 Longstanding persistent atrial fibrillation: Secondary | ICD-10-CM | POA: Diagnosis not present

## 2018-09-15 DIAGNOSIS — E1159 Type 2 diabetes mellitus with other circulatory complications: Secondary | ICD-10-CM

## 2018-09-15 DIAGNOSIS — K047 Periapical abscess without sinus: Secondary | ICD-10-CM | POA: Diagnosis not present

## 2018-09-15 MED ORDER — PENICILLIN V POTASSIUM 500 MG PO TABS: 500.0000 mg | ORAL_TABLET | Freq: Three times a day (TID) | ORAL | 0 refills | Status: DC

## 2018-09-15 NOTE — Progress Notes (Addendum)
Virtual Visit  via Video or Phone Note  I connected with@ on 09/15/18 at 850am by  telemedicine and verified that I am speaking with the correct person using two identifiers.   I discussed the limitations of evaluation and management by telemedicine and the availability of in person appointments. The patient expressed understanding and agreed to proceed.  History of Present Illness: [name] [name] is a 85 y.o. male who would like to discuss tooth pain.  [name] [name] developed tooth pain recently.  His wife has inspected his mouth and think that he lost a crown.  He called my clinic and spoke with my partner [name] [name] on March 21.  Prescribed penicillin and recommended Tylenol.  [name] [name] his wife is also been using Orajel which has been helpful.  He notes that his tooth pain is reducing.  They have tried calling around to his dentist but are having trouble scheduling an appointment due to the COVID-19 pandemic.  Additionally [name] [name] is not sure what to do about his warfarin.  [name] [name] takes 5 mg of warfarin daily and additional 1 mg 4 days a week.  [name] [name] and [name] do not think that they can get to the doctor's office for an INR check.  He is not bleeding.  Additionally [name] [name] is been following [name] [name]'s blood sugar.  His blood sugars have been in the 110s to 130s on the glimepiride.  She thinks that his weight and muscle bulk are improving off of the SGLT2 inhibitor that he was taking previously.    Observations/Objective: BP (!) 99/46   Pulse 84  Wt Readings from Last 5 Encounters:  08/26/18 183 lb (83 kg)  08/18/18 183 lb (83 kg)  07/11/18 186 lb (84.4 kg)  07/08/18 186 lb 3.2 oz (84.5 kg)  06/19/18 192 lb 1.3 oz (87.1 kg)   Exam: Normal Speech.    Lab and Radiology Results No results found for this or any previous visit (from the past 72 hour(s)). No results found.   Assessment and Plan: 85 y.o. male with  Tooth pain due to dental infection likely.  Plan to finish  out course of penicillin.  Due to difficulty with mobility will send in to mail-order pharmacy penicillin to use if pain returns.  May need to use backup mail-order pharmacy if [facility] will process this amount of antibiotic.  Continue Tylenol and Orajel.  Try to follow-up with a dentist if possible..  Warfarin: I think it is reasonable that [name] [name] will likely have some sort of dental procedure in the near future.  I have no way of checking INR and I believe at this point the risk of warfarin is probably more than the benefit to it.  Recommend stopping warfarin for now.  Will resume when we are able to appropriately check INR.  Diabetes: Doing quite well.  Continue current regimen.  PDMP not reviewed this encounter. No orders of the defined types were placed in this encounter.  Meds ordered this encounter  Medications  . penicillin v potassium (VEETID) 500 MG tablet    Sig: Take 1 tablet (500 mg total) by mouth 3 (three) times daily.    Dispense:  90 tablet    Refill:  0    Follow Up Instructions:    I discussed the assessment and treatment plan with the patient. The patient was provided an opportunity to ask questions and all were answered. The patient agreed with the plan and demonstrated an understanding of the instructions.  The patient was advised to call back or seek an in-person evaluation if the symptoms worsen or if the condition fails to improve as anticipated.  I provided 25 minutes of non-face-to-face time during this encounter.    Historical information moved to improve visibility of documentation.  Past Medical History:  Diagnosis Date  . Anemia, unspecified 04/07/2013  . Atrial fibrillation (HCC)    fibrillation/flutter  . BPH (benign prostatic hypertrophy)   . Chronic systolic CHF (congestive heart failure) (HCC)    Nonischemic DCM EF 10% s/p BiV AICD  . Coronary artery disease 2008   nonobstructive ASCAD  . Depression   . Hyperlipidemia   . Hypertension    . Hypothyroidism   . Left bundle branch block   . Nonischemic cardiomyopathy (HCC)    ef 10 %  . OSA (obstructive sleep apnea)    intolerant to CPAP  . Osteoarthritis   . Sleep apnea    stopped using CPAP  . Thrombocytopenia (HCC)    Past Surgical History:  Procedure Laterality Date  . CARDIAC DEFIBRILLATOR PLACEMENT     left chest  . CARDIOVERSION N/A 12/29/2012   Procedure: CARDIOVERSION;  Surgeon: Quintella Reichert, MD;  Location: [facility];  Service: Cardiovascular;  Laterality: N/A;  . CHOLECYSTECTOMY    . Colonscopy     reportedly negative per pt; around 2005.    . EP IMPLANTABLE DEVICE N/A 05/09/2016   Procedure: BIVI ICD Generator Changeout;  Surgeon: Duke Salvia, MD;  Location: [facility] INVASIVE CV LAB;  Service: Cardiovascular;  Laterality: N/A;  . TRANSURETHRAL RESECTION OF PROSTATE     for BPH   Social History   Tobacco Use  . Smoking status: Never Smoker  . Smokeless tobacco: Never Used  Substance Use Topics  . Alcohol use: No   family history includes Cancer in his maternal uncle; Heart disease in his father; Heart failure in his mother; Stroke in his mother.  Medications: Current Outpatient Medications  Medication Sig Dispense Refill  . acetaminophen (TYLENOL) 650 MG CR tablet Take 1 tablet (650 mg total) by mouth every 8 (eight) hours as needed for pain. 90 tablet 3  . AMBULATORY NON FORMULARY MEDICATION Lancets pen type sufficient for three times daily checking. Dispense 3 month supply E11.65 1 each 12  . AMBULATORY NON FORMULARY MEDICATION Test strips for One Touch Verio Flex meter.  Test 3x daily.  Disp 3 month supply.  E11.65 1 each 12  . AMBULATORY NON FORMULARY MEDICATION Chair cushion (donut). Dx: S17.793 [facility]ax: [phone] ([facility]) 1 each 1  . AMBULATORY NON FORMULARY MEDICATION Rolling [place] with seat.  Disp 1.  Use as needed 1 each 0  . carbidopa-levodopa (SINEMET) 25-100 MG tablet Take 1 tablet by mouth 3 (three) times daily.    . diclofenac  sodium (VOLTAREN) 1 % GEL Apply 4 g topically 4 (four) times daily. To affected joint. 1440 g 3  . digoxin (LANOXIN) 0.125 MG tablet TAKE 1 TABLET BY MOUTH  EVERY OTHER DAY 45 tablet 3  . glimepiride (AMARYL) 1 MG tablet Take 1 tablet (1 mg total) by mouth daily with breakfast. 90 tablet 1  . levothyroxine (SYNTHROID, LEVOTHROID) 137 MCG tablet TAKE 1 TABLET BY MOUTH  DAILY BEFORE BREAKFAST 90 tablet 1  . Magnesium Oxide 400 MG CAPS Take 1 capsule (400 mg total) by mouth daily. 90 capsule 0  . midodrine (PROAMATINE) 5 MG tablet Take 1 tablet (5 mg total) by mouth 3 (three) times daily with meals. 90 tablet  3  . penicillin v potassium (VEETID) 500 MG tablet Take 1 tablet (500 mg total) by mouth 3 (three) times daily. 90 tablet 0  . potassium chloride SA (K-DUR,KLOR-CON) 20 MEQ tablet Take 1 tablet (20 mEq total) by mouth daily. 90 tablet 3  . simvastatin (ZOCOR) 10 MG tablet TAKE 1 TABLET BY MOUTH  EVERY EVENING 90 tablet 3  . torsemide (DEMADEX) 20 MG tablet Take 1 tablet (20 mg total) by mouth daily. 30 tablet 0  . warfarin (COUMADIN) 1 MG tablet Take 1 tablet by mouth 4 days a week as directed. (IN ADDITION  TO 5 MG TABLET) 48 tablet 1  . warfarin (COUMADIN) 5 MG tablet Take 1 tablet (5 mg total) by mouth daily. 90 tablet 3   No current facility-administered medications for this visit.    Allergies  Allergen Reactions  . Amiodarone Other (See Comments)    Neuro toxicity    PDMP not reviewed this encounter. No orders of the defined types were placed in this encounter.  Meds ordered this encounter  Medications  . penicillin v potassium (VEETID) 500 MG tablet    Sig: Take 1 tablet (500 mg total) by mouth 3 (three) times daily.    Dispense:  90 tablet    Refill:  0                                                                   Clementeen Graham, MD

## 2018-09-16 ENCOUNTER — Telehealth: Payer: Self-pay

## 2018-09-16 NOTE — Telephone Encounter (Signed)
We will see how it goes.  We will try to keep the infection suppressed until you can see a dentist however this may not be suppressible.

## 2018-09-16 NOTE — Telephone Encounter (Signed)
Patient's wife advised

## 2018-09-16 NOTE — Telephone Encounter (Signed)
[name] [name] would rather wait a while before seeing the dentist. He doesn't want to get exposed if not needed. Please advise.

## 2018-09-22 ENCOUNTER — Telehealth: Payer: Self-pay

## 2018-09-22 ENCOUNTER — Ambulatory Visit: Payer: Medicare Other | Admitting: Family Medicine

## 2018-09-22 NOTE — Telephone Encounter (Signed)
Barbara advised.

## 2018-09-22 NOTE — Telephone Encounter (Signed)
Try stopping the penicillin. If the pain returns try resuming the penicillin.  [name] [name]

## 2018-09-22 NOTE — Telephone Encounter (Signed)
[name] [name] called to see if [name] [name] should take the Penicillin daily? He finished the first prescription and wanted to know if she should continue to give him the penicillin daily.

## 2018-09-25 ENCOUNTER — Telehealth: Payer: Self-pay

## 2018-09-25 NOTE — Telephone Encounter (Signed)
Spoke with pt today who states he is continuing to take his coumadin despite not being able to have an INR drawn. I talked with him and his wife about switching to a DOAC, Eliquis 5mg  bid, per Dr Odessa Fleming recommendation. Pt was concerned with cost, so I offered him an opportunity to have his INR checked with our [place] coumadin clinic. Pt states he does not have a way to get to [place].   He is interested in speaking with our anti-coagulant clinic to discuss switching to Eliquis. He is willing to switch temporarily while there are so many social limitations due to COVID-19.  I will forward this to anti-coag clinic to address bridging. Pt understands someone will call him tomorrow to discuss.

## 2018-09-25 NOTE — Telephone Encounter (Signed)
-----   Message from Duke Salvia, MD sent at 09/25/2018 12:57 PM EDT ----- Agree  lets try apixoban 5 bid  Cr 1.1 last month ----- Message ----- From: Marily Lente, NP Sent: 09/24/2018   9:17 AM EDT To: Duke Salvia, MD, Oretha Milch, RN  Looks like Warfarin was stopped 3/23 because of inability to check INR by PCP - FYI - I didn't know if you wanted to try a DOAC for the short term

## 2018-09-26 MED ORDER — APIXABAN 5 MG PO TABS: 5.0000 mg | ORAL_TABLET | Freq: Two times a day (BID) | ORAL | 1 refills | Status: DC

## 2018-09-26 MED ORDER — APIXABAN 5 MG PO TABS: 5.0000 mg | ORAL_TABLET | Freq: Two times a day (BID) | ORAL | 0 refills | Status: DC

## 2018-09-26 NOTE — Telephone Encounter (Signed)
Called [facility] who state they will not apply 1 month copay cards for mail order rx and pt will need to fill locally. Called pt and his wife states [facility] pharmacy will deliver. Rx sent there and provided copay card info. They will reach out to pt to deliver Eliquis later today.

## 2018-09-26 NOTE — Addendum Note (Signed)
Addended by: SUPPLE, MEGAN E on: 09/26/2018 10:15 AM   Modules accepted: Orders

## 2018-09-26 NOTE — Telephone Encounter (Signed)
With patient's insurance plan, Eliquis will cost $47/month or $131/3 month supply. Spoke with pt and his wife - they are agreeable to transitioning to Eliquis. They would like rx sent to [facility] as they have no way to get to local pharmacy for even first month supply. Will send in copay information on first rx and pt will call clinic when he receives his Eliquis so we can transition him from warfarin to Eliquis (will have him hold 1 day of warfarin then start Eliquis). They were provided with direct Coumadin line.

## 2018-09-29 MED ORDER — APIXABAN 5 MG PO TABS: 5.0000 mg | ORAL_TABLET | Freq: Two times a day (BID) | ORAL | 5 refills | Status: DC

## 2018-09-29 NOTE — Telephone Encounter (Signed)
Patient's wife called clinic saying that Eliquis is cost prohibitive at $131/3 months. Advised pt that she stated cost was affordable when we discussed this last week, she states now it is not affordable. Advised pt that the only cheaper alternative would be warfarin, but pt would need to go back to his PCP for regular INR monitoring. They state that is not possible either as they are unable to leave the home. Asked if paying the $47/mo for Eliquis would be a bit easier and she said yes. Refills sent in to local [facility] pharmacy who can deliver medication to patient's home. She wishes for [place] to complete a Tier exception as well for Eliquis. Advised her that Eliquis is already a Tier 3 which is the lowest branded Tier so this is unlikely to go through, but she still wishes for [place] to try. Will send message to Larita Fife to help with Eliquis tier exception request.

## 2018-09-29 NOTE — Addendum Note (Signed)
Addended by: SUPPLE, MEGAN E on: 09/29/2018 11:44 AM   Modules accepted: Orders

## 2018-09-30 ENCOUNTER — Telehealth: Payer: Self-pay

## 2018-09-30 NOTE — Telephone Encounter (Addendum)
I attempted to do an Eliquis tier exception through covermymeds but was unable as a tier exception form through the pts plan is not available through covermymeds.  I then called [facility] and did a tier exception over the phone with [facility]. Per Corrie Dandy it will take up to 72 hrs for a determination.  Case#: [id number]

## 2018-10-01 NOTE — Telephone Encounter (Signed)
**Note De-Identified [name] [name] Obfuscation** The pt is advised that we received a letter from [facility] stating that they have denied his Eliquis tier exception because Eliquis is already at a tier 3 and cannot be lowered any more per medicare Part D rules.  He verbalized understanding and thanked me for trying. He states that he and his wife are looking into other ways of getting his Eliquis at a lower cost through a local pharmacy.  I have advised him to call us back if he cannot afford Eliquis and he states that he will.

## 2018-10-13 ENCOUNTER — Encounter (HOSPITAL_COMMUNITY): Payer: Medicare Other | Admitting: Cardiology

## 2018-10-20 ENCOUNTER — Other Ambulatory Visit: Payer: Self-pay | Admitting: Internal Medicine

## 2018-10-20 NOTE — Telephone Encounter (Addendum)
Eliquis 5mg  refill request received; pt is 86 yrs, wt-83 kg, Crea-1.1 on 08/08/2018, last seen by Dr. Graciela Husbands on 08/26/2018; will send in refill.

## 2018-10-22 ENCOUNTER — Other Ambulatory Visit: Payer: Self-pay | Admitting: Family Medicine

## 2018-10-29 ENCOUNTER — Ambulatory Visit (HOSPITAL_COMMUNITY)
Admission: RE | Admit: 2018-10-29 | Discharge: 2018-10-29 | Disposition: A | Discharge status: Home / Self Care | Payer: Medicare Other | Source: Ambulatory Visit | Attending: Cardiology | Admitting: Cardiology

## 2018-10-29 ENCOUNTER — Telehealth (HOSPITAL_COMMUNITY): Payer: Self-pay

## 2018-10-29 ENCOUNTER — Other Ambulatory Visit: Payer: Self-pay

## 2018-10-29 DIAGNOSIS — I5022 Chronic systolic (congestive) heart failure: Secondary | ICD-10-CM | POA: Diagnosis not present

## 2018-10-29 DIAGNOSIS — I428 Other cardiomyopathies: Secondary | ICD-10-CM

## 2018-10-29 DIAGNOSIS — I4819 Other persistent atrial fibrillation: Secondary | ICD-10-CM

## 2018-10-29 MED ORDER — APIXABAN 5 MG PO TABS: ORAL_TABLET | ORAL | 2 refills | Status: DC

## 2018-10-29 NOTE — Patient Instructions (Signed)
Today we refilled your eliquis.  We have contacted our Social Worker, Eileen Stanford, to see if you're eligible for patient assistance for eliquis   We have scales in the office, we would need someone to come pick it up for you to be able to take your daily weights.   Recommended follow-up: Follow up in 4 months with Dr Shirlee Latch, this has been scheduled for 9 September @10 :40 am.  Please call the clinic at [phone] for any questions or concerns.

## 2018-10-29 NOTE — Telephone Encounter (Signed)
Spoke with wife to review AVS, was told to call daughter Glennie Isle @ [phone]) as she is the transportation for pt, left daughter a VM to return call to office.  Refilled eliquis.   Eileen Stanford tosee if he is eligible for patient assistance for eliquis   Need scale to be picked up from office.   Follow up in 4 months with Dr Shirlee Latch 9 Sep @ 10:40

## 2018-10-29 NOTE — Progress Notes (Signed)
Heart Failure TeleHealth Note  Due to national recommendations of social distancing due to COVID 19, Audio/video telehealth visit is felt to be most appropriate for this patient at this time.  See MyChart message from today for patient consent regarding telehealth for [facility].  Date:  10/29/2018   ID:  [name] [name], DOB Oct 30, 1931, MRN [id number]  Location: Home  Provider location: [facility] Type of Visit: Established patient   PCP:  Rodolph Bong, MD  Cardiologist:  No primary care provider on file. Primary HF: Dr Shirlee Latch   Chief Complaint: Dr Shirlee Latch   History of Present Illness: [name] [name] is a 85 y.o. male with a history ofA tach s/p DC-CV 12/29/12, CAD, PAF on chronic coumadin and amiodarone, OSA- CPAP, NICM cath 2008, chronic systolic heart failure EF 25-30% (01/2013) , S/P Medtronic CRT-D 2008 and generator changed 2012, LBBB, CRI (baseline 1.7-1.8) and Hypothyroidism.   He is S/P DC-CV 12/29/12 and initially he felt good for about a week. He had a functional decline and was only able to walk a few steps. He presented [facility] ED 01/22/13 with increased dyspnea on exertion and low extremity edema. He was started on a lasix gtt 10 mg hr. Admit weight 233 lbs. Discharge weight: 214 lbs.    He has been off Coreg due to fatigue.  He is now off amiodarone due to imbalance/gait instability.   He was started on Sinemet by neurology for suspected Parkinsons-type syndrome.    In 5/19, he was noted to be orthostatic and bisoprolol, ramipril, and spironolactone were stopped.  He was noted to be in an atrial tachycardia at that time. He has remained in chronic atrial fibrillation.   Echo 10/31/2016 LVEF 35-40%, Mod AI, Mod MR, Severe LAE, Moderate RVH, Severe RAE, PA peak pressure 19 mm Hg.  Echo (10/19) with EF 30-35%, mild LV dilation, normal RV size with mildly decreased systolic function, mild MR, mild AI. PYP scan (10/19) not suggestive of cardiac  amyloidosis.    He presents via Special educational needs teacher for a telehealth visit today. Overall feeling ok. Having weakness in his legs. Last week he had a fall in the bathroom. He denies hitting his head.  Denies SOB/PND/Orthopnea. Appetite ok. No fever or chills. Weight at home  pounds. Taking all medications.  he denies symptoms worrisome for COVID 19.   Past Medical History:  Diagnosis Date  . Anemia, unspecified 04/07/2013  . Atrial fibrillation (HCC)    fibrillation/flutter  . BPH (benign prostatic hypertrophy)   . Chronic systolic CHF (congestive heart failure) (HCC)    Nonischemic DCM EF 10% s/p BiV AICD  . Coronary artery disease 2008   nonobstructive ASCAD  . Depression   . Hyperlipidemia   . Hypertension   . Hypothyroidism   . Left bundle branch block   . Nonischemic cardiomyopathy (HCC)    ef 10 %  . OSA (obstructive sleep apnea)    intolerant to CPAP  . Osteoarthritis   . Sleep apnea    stopped using CPAP  . Thrombocytopenia (HCC)    Past Surgical History:  Procedure Laterality Date  . CARDIAC DEFIBRILLATOR PLACEMENT     left chest  . CARDIOVERSION N/A 12/29/2012   Procedure: CARDIOVERSION;  Surgeon: Quintella Reichert, MD;  Location: [facility];  Service: Cardiovascular;  Laterality: N/A;  . CHOLECYSTECTOMY    . Colonscopy     reportedly negative per pt; around 2005.    . EP IMPLANTABLE  DEVICE N/A 05/09/2016   Procedure: BIVI ICD Generator Changeout;  Surgeon: Duke Salvia, MD;  Location: [facility] INVASIVE CV LAB;  Service: Cardiovascular;  Laterality: N/A;  . TRANSURETHRAL RESECTION OF PROSTATE     for BPH     Current Outpatient Medications  Medication Sig Dispense Refill  . acetaminophen (TYLENOL) 650 MG CR tablet Take 1 tablet (650 mg total) by mouth every 8 (eight) hours as needed for pain. 90 tablet 3  . AMBULATORY NON FORMULARY MEDICATION Lancets pen type sufficient for three times daily checking. Dispense 3 month supply E11.65 1 each 12  . AMBULATORY NON  FORMULARY MEDICATION Test strips for One Touch Verio Flex meter.  Test 3x daily.  Disp 3 month supply.  E11.65 1 each 12  . AMBULATORY NON FORMULARY MEDICATION Chair cushion (donut). Dx: Z61.096 Fax: [phone] ([facility]) 1 each 1  . AMBULATORY NON FORMULARY MEDICATION Rolling Hojnacki with seat.  Disp 1.  Use as needed 1 each 0  . carbidopa-levodopa (SINEMET) 25-100 MG tablet Take 1 tablet by mouth 3 (three) times daily.    . diclofenac sodium (VOLTAREN) 1 % GEL Apply 4 g topically 4 (four) times daily. To affected joint. 1440 g 3  . digoxin (LANOXIN) 0.125 MG tablet TAKE 1 TABLET BY MOUTH  EVERY OTHER DAY 45 tablet 3  . ELIQUIS 5 MG TABS tablet Take 1 tablet (5 mg total) by mouth 2 (two) times daily. 180 tablet 2  . glimepiride (AMARYL) 1 MG tablet Take 1 tablet (1 mg total) by mouth daily with breakfast. 90 tablet 1  . levothyroxine (SYNTHROID, LEVOTHROID) 137 MCG tablet TAKE 1 TABLET BY MOUTH  DAILY BEFORE BREAKFAST 90 tablet 1  . Magnesium Oxide 400 MG CAPS Take 1 capsule (400 mg total) by mouth daily. 90 capsule 0  . midodrine (PROAMATINE) 5 MG tablet Take 1 tablet (5 mg total) by mouth 3 (three) times daily with meals. 90 tablet 3  . penicillin v potassium (VEETID) 500 MG tablet Take 1 tablet (500 mg total) by mouth 3 (three) times daily. 90 tablet 0  . potassium chloride SA (K-DUR) 20 MEQ tablet TAKE 1 TABLET BY MOUTH  DAILY 90 tablet 1  . simvastatin (ZOCOR) 10 MG tablet TAKE 1 TABLET BY MOUTH  EVERY EVENING 90 tablet 3  . torsemide (DEMADEX) 20 MG tablet TAKE 1 TABLET BY MOUTH  DAILY 90 tablet 1   No current facility-administered medications for this encounter.     Allergies:   Amiodarone   Social History:  The patient  reports that he has never smoked. He has never used smokeless tobacco. He reports that he does not drink alcohol or use drugs.   Family History:  The patient's family history includes Cancer in his maternal uncle; Heart disease in his father; Heart failure in his  mother; Stroke in his mother.   ROS:  Please see the history of present illness.   All other systems are personally reviewed and negative.   Exam:  Tele Health Call; Exam is subjective Wife present and on the phone with him.  General:  Speaks in full sentences. No resp difficulty. Lungs: Normal respiratory effort with conversation.  Abdomen: Non-distended per patient report Extremities: Pt denies edema. Neuro: Alert & oriented x 3.   Recent Labs: 02/03/2018: ALT 11 04/07/2018: Platelets 156 04/22/2018: TSH 0.47 08/08/2018: BUN 25; Creatinine 1.1; Hemoglobin 13.1; Potassium 4.1; Sodium 141  Personally reviewed   Wt Readings from Last 3 Encounters:  08/26/18 83 kg (183  lb)  08/18/18 83 kg (183 lb)  07/11/18 84.4 kg (186 lb)      ASSESSMENT AND PLAN:  1. Chronic systolic HF: NICM 01/2013 EF 20%; Medtronic CRT-D.  Most recent echo 10/19 showed EF 30-35%, mild LV dilation, normal RV size with mildly decreased RV systolic function.  NYHA class III symptoms with prominent fatigue, possibly more related to deconditioning and autonomic neuropathy than CHF.  He is not volume overloaded on exam. - Functional class ~II but hard to assess with Parkinsons Disease. Continue torsemide 20 mg daily   - Stay off spironolactone, ramipril, and bisoprolol => due to orthostasis and falls.   - Continue digoxin - He is on empagliflozin.  2) OSA: Continue nightly CPAP 3) Atrial fibrillation: This is persistent, has been present since early 7/19 continuously.  He cannot take amiodarone due to prior toxicity.  He has seen Dr. Graciela Husbands who thought rate control + anticoagulation was the best policy.  Continue eliquis 5 mg twice a day. They are having trouble paying for eliquis. I will see if they are eligible for patient assistnace.  4) Orthostatic hypotension: Suspect autonomic neuropathy.  May be part of the Parkinsons picture.  Cardiac amyloidosis is also a consideration with CHF and autonomic neuropathy.  Cannot  get MRI with CRT-D device. PYP scan from 10/19 was not suggestive of transthyretin amyloidosis.  BP/symptoms have improved on midodrine, no recent falls.   - Continue midodrine 2.5 mg tid.   5) Parkinsons - type syndrome: Sinemet was initially helping, but not so much now.  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   6) Inguinal hernia: He would be at high risk for peri-operative morbidity.  Hopefully, the hernia can be treated non-operatively. He has an appointment with a surgeon in [place].    COVID screen The patient does not have any symptoms that suggest any further testing/ screening at this time.  Social distancing reinforced today.  Patient Risk: After full review of this patients clinical status, I feel that they are at moderate risk for cardiac decompensation at this time.  Relevant cardiac medications were reviewed at length with the patient today. The patient does not have concerns regarding their medications at this time.   The following changes were made today:  Refill eliquis. I will contact HFSW to see if he is eligible for patient assistance.  Recommended follow-up:  Follow up in 4 months with Dr Shirlee Latch.   Today, I have spent 20 minutes with the patient with telehealth technology discussing the above issues .    Waneta Martins, NP  10/29/2018 12:57 PM  [facility] [address] Heart and Vascular [place] [address] [phone] (office) [phone] (fax)

## 2018-10-29 NOTE — Addendum Note (Signed)
Encounter addended by: Shea Stakes, RN on: 10/29/2018 3:18 PM  Actions taken: Pharmacy for encounter modified, Order list changed, Clinical Note Signed

## 2018-10-30 ENCOUNTER — Telehealth (HOSPITAL_COMMUNITY): Payer: Self-pay | Admitting: Licensed Clinical Social Worker

## 2018-10-30 NOTE — Telephone Encounter (Signed)
CSW consulted to assist patient with Eliquis cost concerns.  Pt reports that he pays $47 a month in copays and that they have already tried a tier exception and this was denied.  CSW explained that other option would be to apply for assistance through the [place].  CSW explained application process and need for him to gather pharmacy statements about his 2020 copay costs to submit alongside his application.  Pt expressed understanding.  CSW sent request to clinic to mail pt application for completion.  CSW will continue to follow and assist as needed  Burna Sis, LCSW Clinical Social Worker [facility] Desk#: [phone] Cell#: [phone]

## 2018-10-31 ENCOUNTER — Telehealth (HOSPITAL_COMMUNITY): Payer: Self-pay | Admitting: Licensed Clinical Social Worker

## 2018-10-31 ENCOUNTER — Telehealth (HOSPITAL_COMMUNITY): Payer: Self-pay

## 2018-10-31 NOTE — Telephone Encounter (Signed)
Received message from patients daughter that she was returning call.  She was called to discussed AVS from telehealth visit on 5/6.  All information reviewed include scale being mailed, patient assistance application, eliquis refill and future appt.  Verbalized understanding.  Will pick up samples from office for Eliquis

## 2018-10-31 NOTE — Telephone Encounter (Signed)
CSW referred to assist patient with obtaining a scale. CSW contacted patient to inform scale will be delivered to home next week. Patient grateful for support and assistance. CSW available as needed. Jackie Fredie Majano, LCSW, CCSW-MCS [phone] 

## 2018-11-04 ENCOUNTER — Other Ambulatory Visit (HOSPITAL_COMMUNITY): Payer: Self-pay | Admitting: *Deleted

## 2018-11-05 ENCOUNTER — Telehealth: Payer: Self-pay | Admitting: Family Medicine

## 2018-11-05 DIAGNOSIS — E1169 Type 2 diabetes mellitus with other specified complication: Secondary | ICD-10-CM

## 2018-11-05 DIAGNOSIS — E1159 Type 2 diabetes mellitus with other circulatory complications: Secondary | ICD-10-CM

## 2018-11-05 DIAGNOSIS — I152 Hypertension secondary to endocrine disorders: Secondary | ICD-10-CM

## 2018-11-05 DIAGNOSIS — Z7901 Long term (current) use of anticoagulants: Secondary | ICD-10-CM

## 2018-11-05 DIAGNOSIS — E039 Hypothyroidism, unspecified: Secondary | ICD-10-CM

## 2018-11-05 DIAGNOSIS — I4811 Longstanding persistent atrial fibrillation: Secondary | ICD-10-CM

## 2018-11-05 DIAGNOSIS — Z5181 Encounter for therapeutic drug level monitoring: Secondary | ICD-10-CM

## 2018-11-05 DIAGNOSIS — I428 Other cardiomyopathies: Secondary | ICD-10-CM

## 2018-11-05 NOTE — Telephone Encounter (Signed)
Planning on home visit next week.  Lab ordered in preparation for that visit.

## 2018-11-12 ENCOUNTER — Other Ambulatory Visit: Payer: Self-pay | Admitting: Internal Medicine

## 2018-11-14 ENCOUNTER — Ambulatory Visit: Payer: Medicare Other | Admitting: Family Medicine

## 2018-11-14 ENCOUNTER — Encounter: Payer: Self-pay | Admitting: Family Medicine

## 2018-11-14 VITALS — BP 112/63 | HR 74 | Resp 12 | Wt 192.0 lb

## 2018-11-14 DIAGNOSIS — I428 Other cardiomyopathies: Secondary | ICD-10-CM

## 2018-11-14 DIAGNOSIS — Z95810 Presence of automatic (implantable) cardiac defibrillator: Secondary | ICD-10-CM

## 2018-11-14 DIAGNOSIS — Z7901 Long term (current) use of anticoagulants: Secondary | ICD-10-CM

## 2018-11-14 DIAGNOSIS — M170 Bilateral primary osteoarthritis of knee: Secondary | ICD-10-CM | POA: Diagnosis not present

## 2018-11-14 DIAGNOSIS — E859 Amyloidosis, unspecified: Secondary | ICD-10-CM

## 2018-11-14 DIAGNOSIS — E1159 Type 2 diabetes mellitus with other circulatory complications: Secondary | ICD-10-CM | POA: Diagnosis not present

## 2018-11-14 DIAGNOSIS — I4811 Longstanding persistent atrial fibrillation: Secondary | ICD-10-CM | POA: Diagnosis not present

## 2018-11-14 DIAGNOSIS — M1712 Unilateral primary osteoarthritis, left knee: Secondary | ICD-10-CM

## 2018-11-14 DIAGNOSIS — R29818 Other symptoms and signs involving the nervous system: Secondary | ICD-10-CM

## 2018-11-14 DIAGNOSIS — E039 Hypothyroidism, unspecified: Secondary | ICD-10-CM

## 2018-11-14 DIAGNOSIS — E1169 Type 2 diabetes mellitus with other specified complication: Secondary | ICD-10-CM

## 2018-11-14 DIAGNOSIS — I25708 Atherosclerosis of coronary artery bypass graft(s), unspecified, with other forms of angina pectoris: Secondary | ICD-10-CM | POA: Diagnosis not present

## 2018-11-14 DIAGNOSIS — E785 Hyperlipidemia, unspecified: Secondary | ICD-10-CM | POA: Diagnosis not present

## 2018-11-14 DIAGNOSIS — I10 Essential (primary) hypertension: Secondary | ICD-10-CM

## 2018-11-14 DIAGNOSIS — R259 Unspecified abnormal involuntary movements: Secondary | ICD-10-CM

## 2018-11-14 NOTE — Progress Notes (Signed)
[name] [name] is a 85 y.o. male who had a home visit today for left knee pain, hypertension/heart disease, diabetes, leg swelling, mobility.  Left knee pain.  Patient has an existing history of DJD.  It is been sometime since his last steroid injection of his knee.  He notes his pain is been worsening recently and is having some difficulty with ambulation now.  Diabetes: Taking medication listed below.  Patient tolerates his medication well.  Notes his blood sugars are typically greater than 100 less than 130.  He feels well.  Hypertension/heart disease: Taking medications listed below.  Patient was switched to Eliquis by his cardiologist.  He tolerates it well and denies any significant bleeding or bruising.  Additionally he has continued leg swelling.  This is typically managed with torsemide.  Most the time he notes the swelling is doing well.  He denies any weight gain.  Mobility: Patient has continued difficulty with mobility due to his Parkinson's disease as well as his degenerative changes in the spine and extremities.  He takes carbidopa levodopa which does help.  He denies significant tremor.  ROS as above:  Exam:  BP 112/63   Pulse 74   Resp 12   Wt 192 lb (87.1 kg)   BMI 26.04 kg/m  Wt Readings from Last 5 Encounters:  11/14/18 192 lb (87.1 kg)  08/26/18 183 lb (83 kg)  08/18/18 183 lb (83 kg)  07/11/18 186 lb (84.4 kg)  07/08/18 186 lb 3.2 oz (84.5 kg)    Gen: Well NAD nontoxic no acute distress HEENT: EOMI,  MMM Lungs: Normal work of breathing. CTABL Heart: RRR no MRG Abd: NABS, Soft. Nondistended, Nontender Exts: Brisk capillary refill, warm and well perfused.  Trace lower extremity edema bilaterally. MSK: Left knee mild effusion.  Otherwise normal-appearing Range of motion 0-120 degrees of crepitations.  Not particularly tender.  Stable ligaments exam.  Left knee injection: Consent obtained  and timeout performed. Knee examined revealing no rashes or skin lesion. Superior patellar space identified and marked. Skin cleaned with rubbing alcohol and cold spray applied. 4 mL of lidocaine and 40 mg of Depo-Medrol injected into the knee joint. No significant bleeding. Band-Aid applied.   Phlebotomy: Phlebotomy successfully performed left antecubital fossa.   Assessment and Plan: 85 y.o. male with   Diabetes: Doing well blood sugar controlled.  A1c obtained today with labs. Recheck in 3 months.  Return sooner if needed.  Heart disease: Multifactoral heart disease.  Plan for basic lab work-up and reassessment including metabolic panel.  Labs ordered via phone note last week.  Knee pain: Status post injection today.  Continue diclofenac gel.  Leg swelling: Due to heart failure.  Well managed with torsemide.  No significant weight gain.  Benign-appearing exam today.  Mobility: Continued difficulties.  Continue home exercise program and Parkinson's disease medication.  Reassessment in about 3 months.   Home visit today due to poor mobility and COVID-19.  Patient is effectively homebound. Historical information moved to improve visibility of documentation.  Past Medical History:  Diagnosis Date  . Anemia, unspecified 04/07/2013  . Atrial fibrillation (HCC)    fibrillation/flutter  . BPH (benign prostatic hypertrophy)   . Chronic systolic CHF (congestive heart failure) (HCC)    Nonischemic DCM EF 10% s/p BiV AICD  . Coronary artery disease 2008   nonobstructive ASCAD  . Depression   . Hyperlipidemia   . Hypertension   . Hypothyroidism   . Left bundle  branch block   . Nonischemic cardiomyopathy (HCC)    ef 10 %  . OSA (obstructive sleep apnea)    intolerant to CPAP  . Osteoarthritis   . Sleep apnea    stopped using CPAP  . Thrombocytopenia (HCC)    Past Surgical History:  Procedure Laterality Date  . CARDIAC DEFIBRILLATOR PLACEMENT     left chest  .  CARDIOVERSION N/A 12/29/2012   Procedure: CARDIOVERSION;  Surgeon: Quintella Reichert, MD;  Location: [facility]OS[facility];  Service: Cardiovascular;  Laterality: N/A;  . CHOLECYSTECTOMY    . Colonscopy     reportedly negative per pt; around 2005.    . EP IMPLANTABLE DEVICE N/A 05/09/2016   Procedure: BIVI ICD Generator Changeout;  Surgeon: Duke Salvia, MD;  Location: [facility] INVASIVE CV LAB;  Service: Cardiovascular;  Laterality: N/A;  . TRANSURETHRAL RESECTION OF PROSTATE     for BPH   Social History   Tobacco Use  . Smoking status: Never Smoker  . Smokeless tobacco: Never Used  Substance Use Topics  . Alcohol use: No   family history includes Cancer in his maternal uncle; Heart disease in his father; Heart failure in his mother; Stroke in his mother.  Medications: Current Outpatient Medications  Medication Sig Dispense Refill  . acetaminophen (TYLENOL) 650 MG CR tablet Take 1 tablet (650 mg total) by mouth every 8 (eight) hours as needed for pain. 90 tablet 3  . AMBULATORY NON FORMULARY MEDICATION Lancets pen type sufficient for three times daily checking. Dispense 3 month supply E11.65 1 each 12  . AMBULATORY NON FORMULARY MEDICATION Test strips for One Touch Verio Flex meter.  Test 3x daily.  Disp 3 month supply.  E11.65 1 each 12  . AMBULATORY NON FORMULARY MEDICATION Chair cushion (donut). Dx: B26.203 Fax: [phone] ([facility]) 1 each 1  . AMBULATORY NON FORMULARY MEDICATION Rolling Melichar with seat.  Disp 1.  Use as needed 1 each 0  . apixaban (ELIQUIS) 5 MG TABS tablet Take 1 tablet (5 mg total) by mouth 2 (two) times daily. 180 tablet 2  . carbidopa-levodopa (SINEMET) 25-100 MG tablet Take 1 tablet by mouth 3 (three) times daily.    . diclofenac sodium (VOLTAREN) 1 % GEL Apply 4 g topically 4 (four) times daily. To affected joint. 1440 g 3  . digoxin (LANOXIN) 0.125 MG tablet TAKE 1 TABLET BY MOUTH  EVERY OTHER DAY 45 tablet 1  . glimepiride (AMARYL) 1 MG tablet Take 1 tablet (1 mg  total) by mouth daily with breakfast. 90 tablet 1  . levothyroxine (SYNTHROID, LEVOTHROID) 137 MCG tablet TAKE 1 TABLET BY MOUTH  DAILY BEFORE BREAKFAST 90 tablet 1  . Magnesium Oxide 400 MG CAPS Take 1 capsule (400 mg total) by mouth daily. 90 capsule 0  . midodrine (PROAMATINE) 5 MG tablet Take 1 tablet (5 mg total) by mouth 3 (three) times daily with meals. 90 tablet 3  . potassium chloride SA (K-DUR) 20 MEQ tablet TAKE 1 TABLET BY MOUTH  DAILY 90 tablet 1  . simvastatin (ZOCOR) 10 MG tablet TAKE 1 TABLET BY MOUTH  EVERY EVENING 90 tablet 1  . torsemide (DEMADEX) 20 MG tablet TAKE 1 TABLET BY MOUTH  DAILY 90 tablet 1   No current facility-administered medications for this visit.    Allergies  Allergen Reactions  . Amiodarone Other (See Comments)    Neuro toxicity     Discussed warning signs or symptoms.. Patient expresses understanding.

## 2018-11-15 LAB — CBC
HCT: 36.2 % — ABNORMAL LOW (ref 38.5–50.0)
Hemoglobin: 11.4 g/dL — ABNORMAL LOW (ref 13.2–17.1)
MCH: 28.6 pg (ref 27.0–33.0)
MCHC: 31.5 g/dL — ABNORMAL LOW (ref 32.0–36.0)
MCV: 91 fL (ref 80.0–100.0)
MPV: 12.9 fL — ABNORMAL HIGH (ref 7.5–12.5)
Platelets: 111 10*3/uL — ABNORMAL LOW (ref 140–400)
RBC: 3.98 10*6/uL — ABNORMAL LOW (ref 4.20–5.80)
RDW: 13.7 % (ref 11.0–15.0)
WBC: 6.3 10*3/uL (ref 3.8–10.8)

## 2018-11-15 LAB — COMPLETE METABOLIC PANEL WITH GFR
AG Ratio: 1.8 (calc) (ref 1.0–2.5)
ALT: 13 U/L (ref 9–46)
AST: 18 U/L (ref 10–35)
Albumin: 4.3 g/dL (ref 3.6–5.1)
Alkaline phosphatase (APISO): 96 U/L (ref 35–144)
BUN: 20 mg/dL (ref 7–25)
CO2: 29 mmol/L (ref 20–32)
Calcium: 9.3 mg/dL (ref 8.6–10.3)
Chloride: 100 mmol/L (ref 98–110)
Creat: 1.11 mg/dL (ref 0.70–1.11)
GFR, Est African American: 70 mL/min/{1.73_m2} (ref >=60)
GFR, Est Non African American: 61 mL/min/{1.73_m2} (ref >=60)
Globulin: 2.4 g/dL (calc) (ref 1.9–3.7)
Glucose, Bld: 87 mg/dL (ref 65–99)
Potassium: 4.4 mmol/L (ref 3.5–5.3)
Sodium: 140 mmol/L (ref 135–146)
Total Bilirubin: 0.9 mg/dL (ref 0.2–1.2)
Total Protein: 6.7 g/dL (ref 6.1–8.1)

## 2018-11-15 LAB — HEMOGLOBIN A1C
Hgb A1c MFr Bld: 5.4 % of total Hgb (ref <5.7)
Mean Plasma Glucose: 108 (calc)
eAG (mmol/L): 6 (calc)

## 2018-11-15 LAB — DIGOXIN LEVEL: Digoxin Level: <0.5 mcg/L — ABNORMAL LOW (ref 0.8–2.0)

## 2018-11-15 LAB — PROTIME-INR
INR: 1.8 — ABNORMAL HIGH
Prothrombin Time: 17.9 s — ABNORMAL HIGH (ref 9.0–11.5)

## 2018-11-15 LAB — TSH: TSH: 2.5 mIU/L (ref 0.40–4.50)

## 2018-11-15 LAB — LDL CHOLESTEROL, DIRECT: Direct LDL: 57 mg/dL (ref <100)

## 2018-11-19 ENCOUNTER — Ambulatory Visit (INDEPENDENT_AMBULATORY_CARE_PROVIDER_SITE_OTHER): Payer: Medicare Other | Admitting: *Deleted

## 2018-11-19 DIAGNOSIS — I428 Other cardiomyopathies: Secondary | ICD-10-CM | POA: Diagnosis not present

## 2018-11-19 LAB — CUP PACEART REMOTE DEVICE CHECK
Battery Remaining Longevity: 53 mo
Battery Voltage: 2.97 V
Brady Statistic AP VP Percent: 48.55 %
Brady Statistic AP VS Percent: 0.55 %
Brady Statistic AS VP Percent: 45.86 %
Brady Statistic AS VS Percent: 5.04 %
Brady Statistic RA Percent Paced: 40.57 %
Brady Statistic RV Percent Paced: 91.79 %
Date Time Interrogation Session: 20200527084224
HighPow Impedance: 35 Ohm
HighPow Impedance: 41 Ohm
Implantable Lead Implant Date: 20080806
Implantable Lead Implant Date: 20080806
Implantable Lead Implant Date: 20080806
Implantable Lead Location: 753858
Implantable Lead Location: 753859
Implantable Lead Location: 753860
Implantable Lead Model: 4076
Implantable Lead Model: 6947
Implantable Pulse Generator Implant Date: 20171115
Lead Channel Impedance Value: 285 Ohm
Lead Channel Impedance Value: 342 Ohm
Lead Channel Impedance Value: 380 Ohm
Lead Channel Impedance Value: 494 Ohm
Lead Channel Impedance Value: 513 Ohm
Lead Channel Impedance Value: 893 Ohm
Lead Channel Pacing Threshold Amplitude: 0.75 V
Lead Channel Pacing Threshold Pulse Width: 0.4 ms
Lead Channel Sensing Intrinsic Amplitude: 0.5 mV
Lead Channel Sensing Intrinsic Amplitude: 0.5 mV
Lead Channel Sensing Intrinsic Amplitude: 4.25 mV
Lead Channel Sensing Intrinsic Amplitude: 4.25 mV
Lead Channel Setting Pacing Amplitude: 2 V
Lead Channel Setting Pacing Amplitude: 2 V
Lead Channel Setting Pacing Amplitude: 2 V
Lead Channel Setting Pacing Pulse Width: 0.4 ms
Lead Channel Setting Pacing Pulse Width: 0.4 ms
Lead Channel Setting Sensing Sensitivity: 0.3 mV

## 2018-11-20 ENCOUNTER — Telehealth: Payer: Self-pay

## 2018-11-20 NOTE — Telephone Encounter (Signed)
[name] [name] advised of recommendations.  

## 2018-11-20 NOTE — Telephone Encounter (Signed)
No it is okay to wait a little bit.  The tendon back down after about a week.

## 2018-11-20 NOTE — Telephone Encounter (Signed)
Left message advising of recommendations.  

## 2018-11-20 NOTE — Telephone Encounter (Signed)
[name] called to let Dr Denyse Amass know that [name] [name]'s sugars have been trending upwards. He received the injection on Friday and then his daily sugar checks went from his normal 118-120s to 136 yesterday and 142 today. Advised [name] that that can happen with these injections.She wants to know if she should be doing anything different for him. She has calibrated the meter and supplies to make sure readings are accurate.  Please advise

## 2018-11-23 ENCOUNTER — Telehealth: Payer: Self-pay | Admitting: Physician Assistant

## 2018-11-23 NOTE — Telephone Encounter (Signed)
Pt daughter called stating her father had increased swelling in his feet and dyspnea on exertion. I reviewed the chart and recommended taking and extra torsemide now. His pressure is in the 100s, she will stay with him. I would like for him to have a telehealth visit early next week with [facility] team. He has not weighed.  We reviewed when to  Call back and when to go to the ER. She expressed understanding of the plan.

## 2018-11-24 NOTE — Telephone Encounter (Signed)
Message sent to [facility] to schedule pt for telehealth visit with Amy early this week

## 2018-11-24 NOTE — Telephone Encounter (Signed)
Cindy advised 

## 2018-11-24 NOTE — Telephone Encounter (Signed)
[name] [name] called, [name]'s daughter, called and states his blood sugar was up this morning. She also states he has swelling in his feet and some shortness of breath when he walks. She does have a call into the cardiologist about the swelling and shortness of breath.   28 th 142 mg/dl 29 th 086 mg/dl 30 th 578 mg/dl 31 st 469 mg/dl 1 st 629 mg/dl

## 2018-11-24 NOTE — Telephone Encounter (Signed)
Those blood sugars are okay.  We will continue to follow.  Likely were due to recent knee injection.  This should start settling down.

## 2018-11-25 ENCOUNTER — Ambulatory Visit (HOSPITAL_COMMUNITY)
Admission: RE | Admit: 2018-11-25 | Discharge: 2018-11-25 | Disposition: A | Discharge status: Home / Self Care | Payer: Medicare Other | Source: Ambulatory Visit | Attending: Adult Health | Admitting: Adult Health

## 2018-11-25 ENCOUNTER — Other Ambulatory Visit: Payer: Self-pay

## 2018-11-25 ENCOUNTER — Encounter (HOSPITAL_COMMUNITY): Payer: Self-pay | Admitting: *Deleted

## 2018-11-25 VITALS — Wt 200.0 lb

## 2018-11-25 DIAGNOSIS — R600 Localized edema: Secondary | ICD-10-CM

## 2018-11-25 DIAGNOSIS — I5022 Chronic systolic (congestive) heart failure: Secondary | ICD-10-CM

## 2018-11-25 DIAGNOSIS — I428 Other cardiomyopathies: Secondary | ICD-10-CM

## 2018-11-25 DIAGNOSIS — I4819 Other persistent atrial fibrillation: Secondary | ICD-10-CM

## 2018-11-25 NOTE — Patient Instructions (Signed)
Increase Torsemide to 40 mg (2 tabs) daily for 2 DAYS ONLY, then back to 20 mg (1 tab) DAILY  [facility] will contact you  Your physician recommends that you schedule a follow-up telephone call on Friday 6/5 at 9 am.

## 2018-11-25 NOTE — Progress Notes (Signed)
Heart Failure TeleHealth Note  Due to national recommendations of social distancing due to COVID 19, Audio/video telehealth visit is felt to be most appropriate for this patient at this time.  See MyChart message from today for patient consent regarding telehealth for [facility].  Date:  11/25/2018   ID:  [name] [name], DOB [birth year], MRN [id number]  Location: Home  Provider location: [facility] Type of Visit: Established patient  PCP:  Rodolph Bong, MD  Cardiologist:  No primary care provider on file. Primary HF: Dr Shirlee Latch  Chief Complaint: Heart Failure    History of Present Illness: [name] [name] is a 85 y.o. male with a history of ofA tach s/p DC-CV 12/29/12, CAD, PAF on chronic coumadin and amiodarone, OSA- CPAP, NICM cath 2008, chronic systolic heart failure EF 25-30% (01/2013) , S/P Medtronic CRT-D 2008 and generator changed 2012, LBBB, CRI (baseline 1.7-1.8) and Hypothyroidism.   He is S/P DC-CV 12/29/12 and initially he felt good for about a week. He had a functional decline and was only able to walk a few steps. He presented [facility] ED 01/22/13 with increased dyspnea on exertion and low extremity edema. He was started on a lasix gtt 10 mg hr. Admit weight 233 lbs. Discharge weight: 214 lbs.   He has been off Coreg due to fatigue. He is now off amiodarone due to imbalance/gait instability.   He was started on Sinemet by neurology for suspected Parkinsons-type syndrome.   In 5/19, he was noted to be orthostatic and bisoprolol, ramipril, and spironolactone were stopped. He was noted to be in an atrial tachycardia at that time. He has remained in chronic atrial fibrillation.  Echo 10/31/2016 LVEF 35-40%, Mod AI, Mod MR, Severe LAE, Moderate RVH, Severe RAE, PA peak pressure 19 mm Hg. Echo (10/19) with EF 30-35%, mild LV dilation, normal RV size with mildly decreased systolic function, mild MR, mild AI. PYP scan (10/19) not suggestive of cardiac  amyloidosis.   He presents via [employer] for a telehealth visit today with his daughter and wife. Complaining of edema in his feet. Overall feeling fine. SOB with exertion. Denies PND/Orthopnea. Appetite ok. No fever or chills. Weight at home trending up 192-->200 pounds. No bleeding issues.  Taking all medications.    he denies symptoms worrisome for COVID 19.   Past Medical History:  Diagnosis Date  . Anemia, unspecified 04/07/2013  . Atrial fibrillation (HCC)    fibrillation/flutter  . BPH (benign prostatic hypertrophy)   . Chronic systolic CHF (congestive heart failure) (HCC)    Nonischemic DCM EF 10% s/p BiV AICD  . Coronary artery disease 2008   nonobstructive ASCAD  . Depression   . Hyperlipidemia   . Hypertension   . Hypothyroidism   . Left bundle branch block   . Nonischemic cardiomyopathy (HCC)    ef 10 %  . OSA (obstructive sleep apnea)    intolerant to CPAP  . Osteoarthritis   . Sleep apnea    stopped using CPAP  . Thrombocytopenia (HCC)    Past Surgical History:  Procedure Laterality Date  . CARDIAC DEFIBRILLATOR PLACEMENT     left chest  . CARDIOVERSION N/A 12/29/2012   Procedure: CARDIOVERSION;  Surgeon: Quintella Reichert, MD;  [facility];  Service: Cardiovascular;  Laterality: N/A;  . CHOLECYSTECTOMY    . Colonscopy     reportedly negative per pt; around 2005.    . EP IMPLANTABLE DEVICE N/A 05/09/2016  Procedure: BIVI ICD Generator Changeout;  Surgeon: Steven C Klein, MD;  [facility];  Service: Cardiovascular;  Laterality: N/A;  . TRANSURETHRAL RESECTION OF PROSTATE     for BPH     Current Outpatient Medications  Medication Sig Dispense Refill  . acetaminophen (TYLENOL) 650 MG CR tablet Take 1 tablet (650 mg total) by mouth every 8 (eight) hours as needed for pain. 90 tablet 3  . AMBULATORY NON FORMULARY MEDICATION Lancets pen type sufficient for three times daily checking. Dispense 3 month supply E11.65 1 each 12   . AMBULATORY NON FORMULARY MEDICATION Test strips for One Touch Verio Flex meter.  Test 3x daily.  Disp 3 month supply.  E11.65 1 each 12  . AMBULATORY NON FORMULARY MEDICATION Chair cushion (donut). Dx: M84.132: L89.312 Fax: [phone] ([facility]) 1 each 1  . AMBULATORY NON FORMULARY MEDICATION Rolling [place] with seat.  Disp 1.  Use as needed 1 each 0  . apixaban (ELIQUIS) 5 MG TABS tablet Take 1 tablet (5 mg total) by mouth 2 (two) times daily. 180 tablet 2  . carbidopa-levodopa (SINEMET) 25-100 MG tablet Take 1 tablet by mouth 3 (three) times daily.    . diclofenac sodium (VOLTAREN) 1 % GEL Apply 4 g topically 4 (four) times daily. To affected joint. 1440 g 3  . digoxin (LANOXIN) 0.125 MG tablet TAKE 1 TABLET BY MOUTH  EVERY OTHER DAY 45 tablet 1  . glimepiride (AMARYL) 1 MG tablet Take 1 tablet (1 mg total) by mouth daily with breakfast. 90 tablet 1  . levothyroxine (SYNTHROID, LEVOTHROID) 137 MCG tablet TAKE 1 TABLET BY MOUTH  DAILY BEFORE BREAKFAST 90 tablet 1  . Magnesium Oxide 400 MG CAPS Take 1 capsule (400 mg total) by mouth daily. 90 capsule 0  . midodrine (PROAMATINE) 5 MG tablet Take 1 tablet (5 mg total) by mouth 3 (three) times daily with meals. 90 tablet 3  . potassium chloride SA (K-DUR) 20 MEQ tablet TAKE 1 TABLET BY MOUTH  DAILY 90 tablet 1  . simvastatin (ZOCOR) 10 MG tablet TAKE 1 TABLET BY MOUTH  EVERY EVENING 90 tablet 1  . torsemide (DEMADEX) 20 MG tablet TAKE 1 TABLET BY MOUTH  DAILY 90 tablet 1   No current facility-administered medications for this encounter.     Allergies:   Amiodarone   Social History:  The patient  reports that he has never smoked. He has never used smokeless tobacco. He reports that he does not drink alcohol or use drugs.   Family History:  The patient's family history includes Cancer in his maternal uncle; Heart disease in his father; Heart failure in his mother; Stroke in his mother.   ROS:  Please see the history of present illness.   All  other systems are personally reviewed and negative.   Exam: Tele Health Call; Exam is subjective  General:  Speaks in full sentences. No resp difficulty. Lungs: Normal respiratory effort with conversation.  Abdomen: Non-distended per patient report Extremities:  R and LLE with edema. Neuro: Alert & oriented x 3.   Recent Labs: 11/14/2018: ALT 13; BUN 20; Creat 1.11; Hemoglobin 11.4; Platelets 111; Potassium 4.4; Sodium 140; TSH 2.50  Personally reviewed   Wt Readings from Last 3 Encounters:  11/14/18 87.1 kg (192 lb)  08/26/18 83 kg (183 lb)  08/18/18 83 kg (183 lb)      ASSESSMENT AND PLAN:   1. Chronic systolic HF: NICM 01/2013 EF 20%; Medtronic CRT-D. Most recent echo 10/19  showedEF 30-35%, mild LV dilation, normal RV size with mildly decreased RV systolic function.   NYHA IIIb. Hard to  assess with Parkinsons Disease.  - Volume status elevated. Increase torsemide to 40 mg daily x 2 days then back to 20 mg daily.  - Stay off spironolactone, ramipril, and bisoprolol =>due to orthostasis and falls.  - Continue digoxin - He is on empagliflozin. 2) OSA: Continue nightly CPAP 3) Atrial fibrillation: This is persistent, has been present since early 7/19 continuously. He cannot take amiodarone due to prior toxicity. He has seen Dr. Graciela Husbands who thought rate control + anticoagulation was the best policy.  Continue eliquis 5 mg twice a day.  - No bleeding problems. Marland Kitchen  4) Orthostatic hypotension: Suspect autonomic neuropathy. May be part of the Parkinsons picture. Cardiac amyloidosis is also a consideration with CHF and autonomic neuropathy. Cannot get MRI with CRT-D device. PYP scan from 10/19 wasnot suggestive of transthyretin amyloidosis. BP/symptoms have improved on midodrine, no recent falls.  -Continue midodrine 2.5 mg tid. 5) Parkinsons - type syndrome: Sinemet was initially helping, but not so much now. 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   6) Inguinal hernia: He would be at high risk for peri-operative morbidity. Hopefully, the hernia can be treated non-operatively. He has an appointment with a surgeon in [place].   COVID screen The patient does not have any symptoms that suggest any further testing/ screening at this time.  Social distancing reinforced today.  Patient Risk: After full review of this patients clinical status, I feel that they are at moderate risk for cardiac decompensation at this time.  Relevant cardiac medications were reviewed at length with the patient today. The patient does not have concerns regarding their medications at this time.   The following changes were made today: Increase torsemide 40 mg daily x 2 days then back down to 20 mg dialy.  Refer to [facility] Tollie [facility] Clip reading. He will likely need HH PT.  Recommended follow-up:  3 days.   Today, I have spent 16 minutes with the patient with telehealth technology discussing the above issues.  Waneta Martins, NP  11/25/2018 12:03 PM  [facility] [address] Heart and Vascular [place] [address] [phone] (office) [phone] (fax)

## 2018-11-25 NOTE — Addendum Note (Signed)
Encounter addended by: Noralee Space, RN on: 11/25/2018 12:56 PM  Actions taken: Clinical Note Signed, Care Teams modified

## 2018-11-25 NOTE — Progress Notes (Addendum)
Clegg, Amy D, NP  Noralee Space, RN        Increase torsemide 40 mg daily x 2 days then back down to 20 mg dialy.    Refer to [facility] Tollie [facility] Clip reading. He will likely need HH PT.   Recommended follow-up: 3 days.    Referral called into Tiffany, RN w/[facility], she will get ReDS and see pt either today or tomorrow.  Spoke w/pt's wife and she verbalized understanding or increasing Torsemide, f/u call sch for Fri 6/5 at 9 am.  AVS sent via [employer]

## 2018-11-27 ENCOUNTER — Telehealth (HOSPITAL_COMMUNITY): Payer: Self-pay | Admitting: *Deleted

## 2018-11-27 ENCOUNTER — Telehealth (HOSPITAL_COMMUNITY): Payer: Self-pay

## 2018-11-27 ENCOUNTER — Encounter: Payer: Self-pay | Admitting: Adult Health

## 2018-11-27 ENCOUNTER — Encounter: Payer: Self-pay | Admitting: Cardiology

## 2018-11-27 DIAGNOSIS — I5022 Chronic systolic (congestive) heart failure: Secondary | ICD-10-CM

## 2018-11-27 NOTE — Telephone Encounter (Signed)
I called his wife to discuss the [facility] findings and his decline. .  Volume status elevated. Reds vest 41%. He has had some falls over the last couple of days.  His wife says she can not manage him alone due to weakness.   Increase torsemide to 60 mg daily for 2 days then back to 40 mg daily.   I have asked his wife and daughter to consider Palliative Care with possible Hospice.   I would recommend Palliative Care for goals of care.  She is agreeable.    Please refer to [facility] [place].    Patrici Minnis NP-C  4:18 PM'

## 2018-11-27 NOTE — Telephone Encounter (Signed)
Spoke to pt wife about cancelling appointment since they spoke to NP-C today. They were agreeable.

## 2018-11-27 NOTE — Telephone Encounter (Signed)
  HHRN reports: Reds vest - 41% BLEE up to shins Lung sounds: faint crackles SOB w/exertion (difficulty w/mobility x3 days) Hallucinations x3 days  [facility] RN called to report patient family is concerned about patients health and if he would benefit better from [facility] or palliative care. They are concerned that he has reached end of life and would like a call from Tonye Becket, NP-C  Daughter Aram Beecham [phone] Wife Kary Kos   Message routed to Amy Clegg,NP-C

## 2018-11-27 NOTE — Progress Notes (Signed)
Remote ICD transmission.   

## 2018-11-28 ENCOUNTER — Telehealth (HOSPITAL_COMMUNITY): Payer: Medicare Other

## 2018-11-28 NOTE — Telephone Encounter (Signed)
Order for palliative care faxed to [facility] [phone]

## 2018-11-28 NOTE — Addendum Note (Signed)
Addended by: Modesta Messing on: 11/28/2018 10:32 AM   Modules accepted: Orders

## 2018-12-01 ENCOUNTER — Ambulatory Visit (INDEPENDENT_AMBULATORY_CARE_PROVIDER_SITE_OTHER): Payer: Medicare Other | Admitting: Family Medicine

## 2018-12-01 ENCOUNTER — Encounter: Payer: Self-pay | Admitting: Family Medicine

## 2018-12-01 VITALS — BP 103/61 | HR 75

## 2018-12-01 DIAGNOSIS — Z66 Do not resuscitate: Secondary | ICD-10-CM

## 2018-12-01 DIAGNOSIS — E1159 Type 2 diabetes mellitus with other circulatory complications: Secondary | ICD-10-CM

## 2018-12-01 DIAGNOSIS — E1169 Type 2 diabetes mellitus with other specified complication: Secondary | ICD-10-CM

## 2018-12-01 DIAGNOSIS — I10 Essential (primary) hypertension: Secondary | ICD-10-CM

## 2018-12-01 DIAGNOSIS — I5023 Acute on chronic systolic (congestive) heart failure: Secondary | ICD-10-CM

## 2018-12-01 DIAGNOSIS — N183 Chronic kidney disease, stage 3 (moderate): Secondary | ICD-10-CM

## 2018-12-01 DIAGNOSIS — I152 Hypertension secondary to endocrine disorders: Secondary | ICD-10-CM

## 2018-12-01 DIAGNOSIS — E1122 Type 2 diabetes mellitus with diabetic chronic kidney disease: Secondary | ICD-10-CM | POA: Diagnosis not present

## 2018-12-01 DIAGNOSIS — E785 Hyperlipidemia, unspecified: Secondary | ICD-10-CM

## 2018-12-01 DIAGNOSIS — Z7189 Other specified counseling: Secondary | ICD-10-CM

## 2018-12-01 MED ORDER — GLIMEPIRIDE 4 MG PO TABS: 4.0000 mg | ORAL_TABLET | Freq: Every day | ORAL | 1 refills | Status: AC

## 2018-12-01 MED ORDER — TORSEMIDE 20 MG PO TABS: 40.0000 mg | ORAL_TABLET | Freq: Every day | ORAL | 1 refills | Status: AC

## 2018-12-01 NOTE — Progress Notes (Signed)
Note duplication error 

## 2018-12-02 NOTE — Progress Notes (Signed)
Virtual Visit  I connected with      [name] [name]  by a telemedicine application and verified that I am speaking with the correct person using two identifiers.   I discussed the limitations of evaluation and management by telemedicine and the availability of in person appointments. The patient expressed understanding and agreed to proceed.  History of Present Illness: [name] [name] is a 85 y.o. male who would like to discuss diabetes.   Diabetes: Blood sugars fasting now are 160s to 180s.  His wife is already increased the glimepiride to 2 mg daily.  No hypoglycemic episodes.  No significant change in diet.  No fevers or chills.  Patient has had worsening lower extremity edema and had a in-home evaluation with cardiology with a ReDS just showing pulmonary edema at 41%.  In response to this his torsemide was increased from 20 mg daily to 60 mg daily for 2 days then now at 40 mg daily.  On 40 mg daily his lower extremity edema has improved.  Given his overall decline cardiology also after discussion referred to palliative care for goals of care discussion as well as possible hospice referral.  The patient and his wife agree with this.  He is having difficulty at home with simple activities of daily living such as transitioning from a chair to his Barca.   Observations/Objective: BP 103/61   Pulse 75  Wt Readings from Last 5 Encounters:  11/25/18 200 lb (90.7 kg)  11/14/18 192 lb (87.1 kg)  08/26/18 183 lb (83 kg)  08/18/18 183 lb (83 kg)  07/11/18 186 lb (84.4 kg)   Exam: Normal Speech.      Assessment and Plan: 85 y.o. male with  Diabetes: Doing reasonably well.  Plan to continue glimepiride.  Will increase to 4 mg daily and recheck in near future.  In past patient had been on SGLT2 inhibitor but did not like it very much.  Will consider that in the future if we need more diuresis again.  Recheck in 1 week.  Pulmonary edema/lower extremity edema: Continue torsemide 40.   Reassess in 1 week.  Palliative care: Agree with referral.  I think this makes lots of sense.  Will reassess in 1 week.  Marita KansasVernon and Britta MccreedyBarbara will call me in 2 days if they have not been contacted yet.  PDMP not reviewed this encounter. No orders of the defined types were placed in this encounter.  Meds ordered this encounter  Medications  . glimepiride (AMARYL) 4 MG tablet    Sig: Take 1 tablet (4 mg total) by mouth daily with breakfast.    Dispense:  90 tablet    Refill:  1    Replaces jardiance  . torsemide (DEMADEX) 20 MG tablet    Sig: Take 2 tablets (40 mg total) by mouth daily.    Dispense:  180 tablet    Refill:  1    Follow Up Instructions:    I discussed the assessment and treatment plan with the patient. The patient was provided an opportunity to ask questions and all were answered. The patient agreed with the plan and demonstrated an understanding of the instructions.   The patient was advised to call back or seek an in-person evaluation if the symptoms worsen or if the condition fails to improve as anticipated.  Time: 25 minutes of intraservice time, with >39 minutes of total time during today's visit.      Historical information moved to improve visibility  of documentation.  Past Medical History:  Diagnosis Date  . Anemia, unspecified 04/07/2013  . Atrial fibrillation (HCC)    fibrillation/flutter  . BPH (benign prostatic hypertrophy)   . Chronic systolic CHF (congestive heart failure) (HCC)    Nonischemic DCM EF 10% s/p BiV AICD  . Coronary artery disease 2008   nonobstructive ASCAD  . Depression   . Hyperlipidemia   . Hypertension   . Hypothyroidism   . Left bundle branch block   . Nonischemic cardiomyopathy (HCC)    ef 10 %  . OSA (obstructive sleep apnea)    intolerant to CPAP  . Osteoarthritis   . Sleep apnea    stopped using CPAP  . Thrombocytopenia ([place])    Past Surgical History:  Procedure Laterality Date  . CARDIAC DEFIBRILLATOR  PLACEMENT     left chest  . CARDIOVERSION N/A 12/29/2012   Procedure: CARDIOVERSION;  Surgeon: Sueanne Margarita, MD;  Location: [facility];  Service: Cardiovascular;  Laterality: N/A;  . CHOLECYSTECTOMY    . Colonscopy     reportedly negative per pt; around 2005.    . EP IMPLANTABLE DEVICE N/A 05/09/2016   Procedure: Joseph;  Surgeon: Deboraha Sprang, MD;  Location: [place] CV LAB;  Service: Cardiovascular;  Laterality: N/A;  . TRANSURETHRAL RESECTION OF PROSTATE     for BPH   Social History   Tobacco Use  . Smoking status: Never Smoker  . Smokeless tobacco: Never Used  Substance Use Topics  . Alcohol use: No   family history includes Cancer in his maternal uncle; Heart disease in his father; Heart failure in his mother; Stroke in his mother.  Medications: Current Outpatient Medications  Medication Sig Dispense Refill  . acetaminophen (TYLENOL) 650 MG CR tablet Take 1 tablet (650 mg total) by mouth every 8 (eight) hours as needed for pain. 90 tablet 3  . AMBULATORY NON FORMULARY MEDICATION Lancets pen type sufficient for three times daily checking. Dispense 3 month supply E11.65 1 each 12  . AMBULATORY NON FORMULARY MEDICATION Test strips for One Touch Verio Flex meter.  Test 3x daily.  Disp 3 month supply.  E11.65 1 each 12  . AMBULATORY NON FORMULARY MEDICATION Chair cushion (donut). Dx: Y81.448 Fax: [phone] ([facility]) 1 each 1  . AMBULATORY NON FORMULARY MEDICATION Rolling [place] with seat.  Disp 1.  Use as needed 1 each 0  . apixaban (ELIQUIS) 5 MG TABS tablet Take 1 tablet (5 mg total) by mouth 2 (two) times daily. 180 tablet 2  . carbidopa-levodopa (SINEMET) 25-100 MG tablet Take 1 tablet by mouth 3 (three) times daily.    . diclofenac sodium (VOLTAREN) 1 % GEL Apply 4 g topically 4 (four) times daily. To affected joint. 1440 g 3  . digoxin (LANOXIN) 0.125 MG tablet TAKE 1 TABLET BY MOUTH  EVERY OTHER DAY 45 tablet 1  . glimepiride (AMARYL) 4  MG tablet Take 1 tablet (4 mg total) by mouth daily with breakfast. 90 tablet 1  . levothyroxine (SYNTHROID, LEVOTHROID) 137 MCG tablet TAKE 1 TABLET BY MOUTH  DAILY BEFORE BREAKFAST 90 tablet 1  . Magnesium Oxide 400 MG CAPS Take 1 capsule (400 mg total) by mouth daily. 90 capsule 0  . midodrine (PROAMATINE) 5 MG tablet Take 1 tablet (5 mg total) by mouth 3 (three) times daily with meals. 90 tablet 3  . potassium chloride SA (K-DUR) 20 MEQ tablet TAKE 1 TABLET BY MOUTH  DAILY 90 tablet 1  .  simvastatin (ZOCOR) 10 MG tablet TAKE 1 TABLET BY MOUTH  EVERY EVENING 90 tablet 1  . torsemide (DEMADEX) 20 MG tablet Take 2 tablets (40 mg total) by mouth daily. 180 tablet 1   No current facility-administered medications for this visit.    Allergies  Allergen Reactions  . Amiodarone Other (See Comments)    Neuro toxicity

## 2018-12-03 ENCOUNTER — Telehealth: Payer: Self-pay

## 2018-12-03 DIAGNOSIS — D649 Anemia, unspecified: Secondary | ICD-10-CM | POA: Diagnosis not present

## 2018-12-03 DIAGNOSIS — G20 Parkinson's disease: Secondary | ICD-10-CM | POA: Diagnosis not present

## 2018-12-03 DIAGNOSIS — I5022 Chronic systolic (congestive) heart failure: Secondary | ICD-10-CM | POA: Diagnosis not present

## 2018-12-03 DIAGNOSIS — I951 Orthostatic hypotension: Secondary | ICD-10-CM | POA: Diagnosis not present

## 2018-12-03 DIAGNOSIS — M1991 Primary osteoarthritis, unspecified site: Secondary | ICD-10-CM | POA: Diagnosis not present

## 2018-12-03 DIAGNOSIS — I4819 Other persistent atrial fibrillation: Secondary | ICD-10-CM | POA: Diagnosis not present

## 2018-12-03 DIAGNOSIS — Z7189 Other specified counseling: Secondary | ICD-10-CM

## 2018-12-03 DIAGNOSIS — I5023 Acute on chronic systolic (congestive) heart failure: Secondary | ICD-10-CM

## 2018-12-03 DIAGNOSIS — I420 Dilated cardiomyopathy: Secondary | ICD-10-CM | POA: Diagnosis not present

## 2018-12-03 DIAGNOSIS — K4090 Unilateral inguinal hernia, without obstruction or gangrene, not specified as recurrent: Secondary | ICD-10-CM | POA: Diagnosis not present

## 2018-12-03 DIAGNOSIS — G4733 Obstructive sleep apnea (adult) (pediatric): Secondary | ICD-10-CM | POA: Diagnosis not present

## 2018-12-03 DIAGNOSIS — I110 Hypertensive heart disease with heart failure: Secondary | ICD-10-CM | POA: Diagnosis not present

## 2018-12-03 DIAGNOSIS — Z66 Do not resuscitate: Secondary | ICD-10-CM

## 2018-12-03 DIAGNOSIS — Z7901 Long term (current) use of anticoagulants: Secondary | ICD-10-CM | POA: Diagnosis not present

## 2018-12-03 DIAGNOSIS — I2510 Atherosclerotic heart disease of native coronary artery without angina pectoris: Secondary | ICD-10-CM | POA: Diagnosis not present

## 2018-12-03 DIAGNOSIS — Z95810 Presence of automatic (implantable) cardiac defibrillator: Secondary | ICD-10-CM | POA: Diagnosis not present

## 2018-12-03 DIAGNOSIS — I447 Left bundle-branch block, unspecified: Secondary | ICD-10-CM | POA: Diagnosis not present

## 2018-12-03 DIAGNOSIS — E039 Hypothyroidism, unspecified: Secondary | ICD-10-CM | POA: Diagnosis not present

## 2018-12-03 NOTE — Telephone Encounter (Signed)
Patient's daughter [name] called to let Dr Georgina Snell know that they have still not heard back about he referral that was placed to palliative care. They are aware Dr Georgina Snell did not place referral, but daughter states that Dr Georgina Snell is aware of the situation.

## 2018-12-04 NOTE — Telephone Encounter (Signed)
New referral placed to hospice and [facility] [place].  If they are not available we will switch to a different group.  You should hear something by Monday.  If you do not please let me know.  Generally these organizations are quick to respond and I wonder if there is some error

## 2018-12-04 NOTE — Telephone Encounter (Signed)
Called daughter [name] and informed of MD instructions. She agreed with plan and will call if they do not here from [place] on Monday. KG LPN

## 2018-12-09 ENCOUNTER — Encounter: Payer: Self-pay | Admitting: Family Medicine

## 2018-12-09 ENCOUNTER — Ambulatory Visit (INDEPENDENT_AMBULATORY_CARE_PROVIDER_SITE_OTHER): Payer: Medicare Other | Admitting: Family Medicine

## 2018-12-09 VITALS — BP 121/53 | HR 80

## 2018-12-09 DIAGNOSIS — I714 Abdominal aortic aneurysm, without rupture, unspecified: Secondary | ICD-10-CM

## 2018-12-09 DIAGNOSIS — E859 Amyloidosis, unspecified: Secondary | ICD-10-CM

## 2018-12-09 DIAGNOSIS — I5023 Acute on chronic systolic (congestive) heart failure: Secondary | ICD-10-CM

## 2018-12-09 DIAGNOSIS — N183 Chronic kidney disease, stage 3 unspecified: Secondary | ICD-10-CM

## 2018-12-09 DIAGNOSIS — I447 Left bundle-branch block, unspecified: Secondary | ICD-10-CM | POA: Diagnosis not present

## 2018-12-09 DIAGNOSIS — E1122 Type 2 diabetes mellitus with diabetic chronic kidney disease: Secondary | ICD-10-CM

## 2018-12-09 DIAGNOSIS — E1159 Type 2 diabetes mellitus with other circulatory complications: Secondary | ICD-10-CM | POA: Diagnosis not present

## 2018-12-09 DIAGNOSIS — I428 Other cardiomyopathies: Secondary | ICD-10-CM | POA: Diagnosis not present

## 2018-12-09 DIAGNOSIS — I25708 Atherosclerosis of coronary artery bypass graft(s), unspecified, with other forms of angina pectoris: Secondary | ICD-10-CM

## 2018-12-09 DIAGNOSIS — E1169 Type 2 diabetes mellitus with other specified complication: Secondary | ICD-10-CM

## 2018-12-09 DIAGNOSIS — E785 Hyperlipidemia, unspecified: Secondary | ICD-10-CM

## 2018-12-09 DIAGNOSIS — G4733 Obstructive sleep apnea (adult) (pediatric): Secondary | ICD-10-CM

## 2018-12-09 DIAGNOSIS — I129 Hypertensive chronic kidney disease with stage 1 through stage 4 chronic kidney disease, or unspecified chronic kidney disease: Secondary | ICD-10-CM

## 2018-12-09 DIAGNOSIS — R0609 Other forms of dyspnea: Secondary | ICD-10-CM

## 2018-12-09 DIAGNOSIS — D696 Thrombocytopenia, unspecified: Secondary | ICD-10-CM

## 2018-12-09 DIAGNOSIS — I10 Essential (primary) hypertension: Secondary | ICD-10-CM

## 2018-12-09 DIAGNOSIS — Z66 Do not resuscitate: Secondary | ICD-10-CM

## 2018-12-09 DIAGNOSIS — E039 Hypothyroidism, unspecified: Secondary | ICD-10-CM

## 2018-12-09 DIAGNOSIS — I4811 Longstanding persistent atrial fibrillation: Secondary | ICD-10-CM

## 2018-12-09 DIAGNOSIS — Z95810 Presence of automatic (implantable) cardiac defibrillator: Secondary | ICD-10-CM

## 2018-12-09 NOTE — Progress Notes (Signed)
Deactivation of defibrillator function can be arranged through device clinic and would be appropriate.   Richardson Landry, can you coordinate this?

## 2018-12-09 NOTE — Progress Notes (Signed)
Virtual Visit  I connected with      [name] [name]  by a telemedicine application and verified that I am speaking with the correct person using two identifiers.   I discussed the limitations of evaluation and management by telemedicine and the availability of in person appointments. The patient expressed understanding and agreed to proceed.  History of Present Illness: [name] [name] is a 85 y.o. male who would like to discuss diabetes and palliative care.  Patient had been having increased blood sugars and had a phone visit on June 8.  Blood sugars fasting were 160s to 180s.  In response glimepiride was increased to 4 mg daily.  In the interim he notes that BS have been 120s  Additionally patient was having worsening pulmonary edema lower extremity edema.  His cardiologist increased torsemide to 40 mg daily.  In the interim he notes that he has increased exertional capacity and improved swelling.    [facility] to [facility] [place] [facility]/palliative care services on June 11 due to declining functional status and end-stage heart failure. Had discussion about hospice and palliative and had some concerns.  In general the patient and his family agree with a gradual transition to more comfort measures.  They would like to see about modifying the defibrillator so that it does not shock [name] [name] if he is dying.  Additionally they like to stop medications if possible.  Did like me to continue to be primary hospice or palliative care services offer recommendations as a Research scientist (medical).  Family notes that [name] [name] continues to be confused.    Observations/Objective: BP (!) 121/53   Pulse 80  Wt Readings from Last 5 Encounters:  11/25/18 200 lb (90.7 kg)  11/14/18 192 lb (87.1 kg)  08/26/18 183 lb (83 kg)  08/18/18 183 lb (83 kg)  07/11/18 186 lb (84.4 kg)   Exam: Normal Speech.  Some confusion with discussion of goals of care.  However family was able to fill in gaps.  Lab and  Radiology Results No results found for this or any previous visit (from the past 72 hour(s)). No results found.   Assessment and Plan: 85 y.o. male with  Diabetes: Much improved.  Continue current regimen recheck in about 2 to 3 weeks.  Heart failure: Slowly worsening.  After discussion of goals of care today with patient and his family will transition to more comfort measures.  We will still provide most medications and I will maintain primary care while allowing hospice to consult.  Will discontinue Eliquis and simvastatin.  Will consult and discuss with cardiology about potentially disabling the defibrillator.   PDMP not reviewed this encounter. No orders of the defined types were placed in this encounter.  No orders of the defined types were placed in this encounter.   Follow Up Instructions:    I discussed the assessment and treatment plan with the patient. The patient was provided an opportunity to ask questions and all were answered. The patient agreed with the plan and demonstrated an understanding of the instructions.   The patient was advised to call back or seek an in-person evaluation if the symptoms worsen or if the condition fails to improve as anticipated.  Time: 25 minutes of intraservice time, with >39 minutes of total time during today's visit.      Historical information moved to improve visibility of documentation.  Past Medical History:  Diagnosis Date  . Anemia, unspecified 04/07/2013  . Atrial fibrillation (HCC)  fibrillation/flutter  . BPH (benign prostatic hypertrophy)   . Chronic systolic CHF (congestive heart failure) (HCC)    Nonischemic DCM EF 10% s/p BiV AICD  . Coronary artery disease 2008   nonobstructive ASCAD  . Depression   . Hyperlipidemia   . Hypertension   . Hypothyroidism   . Left bundle branch block   . Nonischemic cardiomyopathy (HCC)    ef 10 %  . OSA (obstructive sleep apnea)    intolerant to CPAP  . Osteoarthritis   .  Sleep apnea    stopped using CPAP  . Thrombocytopenia ([place])    Past Surgical History:  Procedure Laterality Date  . CARDIAC DEFIBRILLATOR PLACEMENT     left chest  . CARDIOVERSION N/A 12/29/2012   Procedure: CARDIOVERSION;  Surgeon: Sueanne Margarita, MD;  Location: [facility];  Service: Cardiovascular;  Laterality: N/A;  . CHOLECYSTECTOMY    . Colonscopy     reportedly negative per pt; around 2005.    . EP IMPLANTABLE DEVICE N/A 05/09/2016   Procedure: [place];  Surgeon: Deboraha Sprang, MD;  Location: [facility];  Service: Cardiovascular;  Laterality: N/A;  . TRANSURETHRAL RESECTION OF PROSTATE     for BPH   Social History   Tobacco Use  . Smoking status: Never Smoker  . Smokeless tobacco: Never Used  Substance Use Topics  . Alcohol use: No   family history includes Cancer in his maternal uncle; Heart disease in his father; Heart failure in his mother; Stroke in his mother.  Medications: Current Outpatient Medications  Medication Sig Dispense Refill  . acetaminophen (TYLENOL) 650 MG CR tablet Take 1 tablet (650 mg total) by mouth every 8 (eight) hours as needed for pain. 90 tablet 3  . AMBULATORY NON FORMULARY MEDICATION Lancets pen type sufficient for three times daily checking. Dispense 3 month supply E11.65 1 each 12  . AMBULATORY NON FORMULARY MEDICATION Test strips for One Touch Verio Flex meter.  Test 3x daily.  Disp 3 month supply.  E11.65 1 each 12  . carbidopa-levodopa (SINEMET) 25-100 MG tablet Take 1 tablet by mouth 3 (three) times daily.    . diclofenac sodium (VOLTAREN) 1 % GEL Apply 4 g topically 4 (four) times daily. To affected joint. 1440 g 3  . digoxin (LANOXIN) 0.125 MG tablet TAKE 1 TABLET BY MOUTH  EVERY OTHER DAY 45 tablet 1  . glimepiride (AMARYL) 4 MG tablet Take 1 tablet (4 mg total) by mouth daily with breakfast. 90 tablet 1  . levothyroxine (SYNTHROID, LEVOTHROID) 137 MCG tablet TAKE 1 TABLET BY MOUTH  DAILY BEFORE  BREAKFAST 90 tablet 1  . Magnesium Oxide 400 MG CAPS Take 1 capsule (400 mg total) by mouth daily. 90 capsule 0  . midodrine (PROAMATINE) 5 MG tablet Take 1 tablet (5 mg total) by mouth 3 (three) times daily with meals. 90 tablet 3  . potassium chloride SA (K-DUR) 20 MEQ tablet TAKE 1 TABLET BY MOUTH  DAILY 90 tablet 1  . torsemide (DEMADEX) 20 MG tablet Take 2 tablets (40 mg total) by mouth daily. 180 tablet 1   No current facility-administered medications for this visit.    Allergies  Allergen Reactions  . Amiodarone Other (See Comments)    Neuro toxicity

## 2018-12-11 ENCOUNTER — Telehealth: Payer: Self-pay | Admitting: Family Medicine

## 2018-12-11 MED ORDER — PENICILLIN V POTASSIUM 500 MG PO TABS: 500.0000 mg | ORAL_TABLET | Freq: Four times a day (QID) | ORAL | 1 refills | Status: AC

## 2018-12-11 NOTE — Telephone Encounter (Signed)
[name] [name] is having dental pain again c/w prior dental infection.  Plan to prescribe again course of penicillin.  Also [name] had a question about hospice. A nurse called and said that they cannot use "Morphine because of Kidney failure".  I plan to call [facility] and ask about that statement. He does not have significant CKD. His GFR is >60 and I dont see why morphine would be a problem in hospice even with renal failure.

## 2018-12-12 ENCOUNTER — Telehealth: Payer: Self-pay | Admitting: Internal Medicine

## 2018-12-12 DIAGNOSIS — I951 Orthostatic hypotension: Secondary | ICD-10-CM | POA: Diagnosis not present

## 2018-12-12 DIAGNOSIS — I110 Hypertensive heart disease with heart failure: Secondary | ICD-10-CM | POA: Diagnosis not present

## 2018-12-12 DIAGNOSIS — M1991 Primary osteoarthritis, unspecified site: Secondary | ICD-10-CM | POA: Diagnosis not present

## 2018-12-12 DIAGNOSIS — G4733 Obstructive sleep apnea (adult) (pediatric): Secondary | ICD-10-CM | POA: Diagnosis not present

## 2018-12-12 DIAGNOSIS — I447 Left bundle-branch block, unspecified: Secondary | ICD-10-CM | POA: Diagnosis not present

## 2018-12-12 DIAGNOSIS — E039 Hypothyroidism, unspecified: Secondary | ICD-10-CM | POA: Diagnosis not present

## 2018-12-12 DIAGNOSIS — K4090 Unilateral inguinal hernia, without obstruction or gangrene, not specified as recurrent: Secondary | ICD-10-CM | POA: Diagnosis not present

## 2018-12-12 DIAGNOSIS — I420 Dilated cardiomyopathy: Secondary | ICD-10-CM | POA: Diagnosis not present

## 2018-12-12 DIAGNOSIS — Z95810 Presence of automatic (implantable) cardiac defibrillator: Secondary | ICD-10-CM | POA: Diagnosis not present

## 2018-12-12 DIAGNOSIS — D649 Anemia, unspecified: Secondary | ICD-10-CM | POA: Diagnosis not present

## 2018-12-12 DIAGNOSIS — G20 Parkinson's disease: Secondary | ICD-10-CM | POA: Diagnosis not present

## 2018-12-12 DIAGNOSIS — I5022 Chronic systolic (congestive) heart failure: Secondary | ICD-10-CM | POA: Diagnosis not present

## 2018-12-12 DIAGNOSIS — Z7901 Long term (current) use of anticoagulants: Secondary | ICD-10-CM | POA: Diagnosis not present

## 2018-12-12 DIAGNOSIS — I4819 Other persistent atrial fibrillation: Secondary | ICD-10-CM | POA: Diagnosis not present

## 2018-12-12 DIAGNOSIS — I2510 Atherosclerotic heart disease of native coronary artery without angina pectoris: Secondary | ICD-10-CM | POA: Diagnosis not present

## 2018-12-12 NOTE — Telephone Encounter (Signed)
Attempted to return call. Able to find number for [facility]. Office closed at 5:00pm, unable to [place] for [place]. Will try again on Monday morning.

## 2018-12-12 NOTE — Telephone Encounter (Signed)
[name] [name], with [facility] called stating that [employer] is coming on Monday turn off his defibrillator.

## 2018-12-15 ENCOUNTER — Telehealth: Payer: Self-pay

## 2018-12-15 NOTE — Telephone Encounter (Signed)
LMOVM for Izora Gala, RN, requesting call back to DC. Gave direct number for return call.

## 2018-12-15 NOTE — Telephone Encounter (Signed)
Copy of order received from [facility]. Will have order scanned in to patient's chart.

## 2018-12-15 NOTE — Telephone Encounter (Signed)
Tomi Bamberger aware of pt's request to reschedule. She will contact Izora Gala directly to setup a new appointment time

## 2018-12-15 NOTE — Telephone Encounter (Signed)
Spoke with Izora Gala, RN. Pt was scheduled for ICD therapy deactivation today at 3:00pm. Order has already been signed by hospice MD. Requested copy of order to be sent to DC, gave fax number.   Pt and family requesting to reschedule deactivation to later this week (Thursday/Friday) per Izora Gala. She requests that I make [employer] rep aware. She also requests that I make Dr. Caryl Comes aware. Note routed to Dr. Caryl Comes.  Tomi Bamberger updated as to patient's request. Nancy's cell number is [phone].

## 2018-12-15 NOTE — Telephone Encounter (Signed)
Izora Gala, Rn hospice nurse called wanting to speak with [facility]. I told her per Raquel Sarna phone note that Tomi Bamberger was aware that hospice wanting to reschedule and Tomi Bamberger will reach out to her. She left her phone number for Tomi Bamberger to call her [phone]

## 2018-12-15 NOTE — Telephone Encounter (Signed)
Izora Gala, RN w/ [facility] is returning your call.

## 2018-12-16 NOTE — Telephone Encounter (Signed)
Spoke with Izora Gala, RN. She reports she spoke with [employer] rep last night, able to setup appointment for ICD deactivation today. She denies any additional needs at this time.

## 2018-12-17 DIAGNOSIS — M1991 Primary osteoarthritis, unspecified site: Secondary | ICD-10-CM | POA: Diagnosis not present

## 2018-12-17 DIAGNOSIS — K4090 Unilateral inguinal hernia, without obstruction or gangrene, not specified as recurrent: Secondary | ICD-10-CM | POA: Diagnosis not present

## 2018-12-17 DIAGNOSIS — I110 Hypertensive heart disease with heart failure: Secondary | ICD-10-CM | POA: Diagnosis not present

## 2018-12-17 DIAGNOSIS — Z7901 Long term (current) use of anticoagulants: Secondary | ICD-10-CM | POA: Diagnosis not present

## 2018-12-17 DIAGNOSIS — I447 Left bundle-branch block, unspecified: Secondary | ICD-10-CM | POA: Diagnosis not present

## 2018-12-17 DIAGNOSIS — D649 Anemia, unspecified: Secondary | ICD-10-CM | POA: Diagnosis not present

## 2018-12-17 DIAGNOSIS — G4733 Obstructive sleep apnea (adult) (pediatric): Secondary | ICD-10-CM | POA: Diagnosis not present

## 2018-12-17 DIAGNOSIS — G20 Parkinson's disease: Secondary | ICD-10-CM | POA: Diagnosis not present

## 2018-12-17 DIAGNOSIS — I4819 Other persistent atrial fibrillation: Secondary | ICD-10-CM | POA: Diagnosis not present

## 2018-12-17 DIAGNOSIS — I951 Orthostatic hypotension: Secondary | ICD-10-CM | POA: Diagnosis not present

## 2018-12-17 DIAGNOSIS — I2510 Atherosclerotic heart disease of native coronary artery without angina pectoris: Secondary | ICD-10-CM | POA: Diagnosis not present

## 2018-12-17 DIAGNOSIS — E039 Hypothyroidism, unspecified: Secondary | ICD-10-CM | POA: Diagnosis not present

## 2018-12-17 DIAGNOSIS — I420 Dilated cardiomyopathy: Secondary | ICD-10-CM | POA: Diagnosis not present

## 2018-12-17 DIAGNOSIS — I5022 Chronic systolic (congestive) heart failure: Secondary | ICD-10-CM | POA: Diagnosis not present

## 2018-12-17 DIAGNOSIS — Z95810 Presence of automatic (implantable) cardiac defibrillator: Secondary | ICD-10-CM | POA: Diagnosis not present

## 2018-12-18 ENCOUNTER — Telehealth: Payer: Self-pay | Admitting: Family Medicine

## 2018-12-18 NOTE — Telephone Encounter (Signed)
Copy of email correspondence with daughter  Thank you for the update.   Oxycodone can absolutely cause breathing problems.  It suppresses the respiratory drive. We use It (or morphine or other opiates) to reduce air hunger. That is the sensation that you cannot catch your breath. In heart failure sometimes people feel like they cannot breath. Oxycodone or morphine helps to reduce that symptom a lot. It can be dangerous. The benefit outweighs the risk especially at the end of life.   I am glad the defibulator was turned off.   I will discuss with hospice prior to next check up.   Clayburn Pert  From: Glennie [email or web address]  Sent: Thursday, December 18, 2018 10:08 AM To: Clementeen [email or web address] Cc: [email or web address]> Subject: [External Email][name] [name] [birth year]  *Caution - External email - see footer for warnings* Good Morning, Dr. Denyse Amass  A lot has happened in the last week with my dad, and mom and I were hoping that perhaps you could clarify a few things for [place] prior to our phone visit next week.  First of all, when we last spoke, there were a couple of issues that were in limbo:  A) Turning off defibrillator and how that works B) Medications to have on hand per Hospice C) Hospice Report  Since that time, [employer] has come in and turned off Dad's defibrillator.   It was a simple as flipping as switch.   No magnets on his chest as nurse described.   Apolinar Junes also said that if he changed his mind, it could be turned back on just as easily.     FYI, the form that Dad was required to sign listed Gwenyth Ober as his physician.   I will say we have some concerns about a doctor that has never seen nor spoken with my dad is listed as his physician, particularly when Mom specifically stated that she wanted you to continue to serve as Dad's primary physician.     With regards to the medications, the same physician I mentioned above, has prescribed medications for  him.     Mervin Hack, Dad's hospice nurse told us that he could not have morphine because his GFR was too low.    She was recommending oxycodone instead to assist with pain and breathing problems.   She also thinks Dad needs a sleep aid, although we have told her that he's nearly always sleeping when we check on him during the night.   The medications that were called in are the following:                  Oxycodone  (5 mg) -  tablet every 2 hours                 QUEtiapine fumarate, generic for SEROquel (25 mg) - 1 tablet at night   When my sister picked up the medications, the pharmacist cautioned her that the oxycodone would cause him breathing problems.    Since this is the medication prescribed in place of the morphine, we would like your input on the prescribed medications.  Finally, we would like to know if you have received the report from Hospice.    Harriett Sine said she tried to contact you both via text and phone prior to turning off the defibrillator.    We did request that she contact you.    We also want to make sure that you are being consulted in his care.  Would appreciate your input regarding these concerns.   You may contact us at [phone] or reply to this email.    I appreciate all you have done and continue to do in supporting my parents with their health care concerns.  Sincerely,  Danne Harbor and Rudolpho Sevin

## 2018-12-19 DIAGNOSIS — I447 Left bundle-branch block, unspecified: Secondary | ICD-10-CM | POA: Diagnosis not present

## 2018-12-19 DIAGNOSIS — G20 Parkinson's disease: Secondary | ICD-10-CM | POA: Diagnosis not present

## 2018-12-19 DIAGNOSIS — M1991 Primary osteoarthritis, unspecified site: Secondary | ICD-10-CM | POA: Diagnosis not present

## 2018-12-19 DIAGNOSIS — Z95810 Presence of automatic (implantable) cardiac defibrillator: Secondary | ICD-10-CM | POA: Diagnosis not present

## 2018-12-19 DIAGNOSIS — D649 Anemia, unspecified: Secondary | ICD-10-CM | POA: Diagnosis not present

## 2018-12-19 DIAGNOSIS — G4733 Obstructive sleep apnea (adult) (pediatric): Secondary | ICD-10-CM | POA: Diagnosis not present

## 2018-12-19 DIAGNOSIS — E039 Hypothyroidism, unspecified: Secondary | ICD-10-CM | POA: Diagnosis not present

## 2018-12-19 DIAGNOSIS — I420 Dilated cardiomyopathy: Secondary | ICD-10-CM | POA: Diagnosis not present

## 2018-12-19 DIAGNOSIS — Z7901 Long term (current) use of anticoagulants: Secondary | ICD-10-CM | POA: Diagnosis not present

## 2018-12-19 DIAGNOSIS — I951 Orthostatic hypotension: Secondary | ICD-10-CM | POA: Diagnosis not present

## 2018-12-19 DIAGNOSIS — I2510 Atherosclerotic heart disease of native coronary artery without angina pectoris: Secondary | ICD-10-CM | POA: Diagnosis not present

## 2018-12-19 DIAGNOSIS — I110 Hypertensive heart disease with heart failure: Secondary | ICD-10-CM | POA: Diagnosis not present

## 2018-12-19 DIAGNOSIS — I4819 Other persistent atrial fibrillation: Secondary | ICD-10-CM | POA: Diagnosis not present

## 2018-12-19 DIAGNOSIS — K4090 Unilateral inguinal hernia, without obstruction or gangrene, not specified as recurrent: Secondary | ICD-10-CM | POA: Diagnosis not present

## 2018-12-19 DIAGNOSIS — I5022 Chronic systolic (congestive) heart failure: Secondary | ICD-10-CM | POA: Diagnosis not present

## 2018-12-23 ENCOUNTER — Ambulatory Visit (INDEPENDENT_AMBULATORY_CARE_PROVIDER_SITE_OTHER): Payer: Medicare Other | Admitting: Family Medicine

## 2018-12-23 VITALS — BP 94/66 | HR 76

## 2018-12-23 DIAGNOSIS — I5084 End stage heart failure: Secondary | ICD-10-CM | POA: Diagnosis not present

## 2018-12-23 DIAGNOSIS — M1991 Primary osteoarthritis, unspecified site: Secondary | ICD-10-CM | POA: Diagnosis not present

## 2018-12-23 DIAGNOSIS — I4819 Other persistent atrial fibrillation: Secondary | ICD-10-CM | POA: Diagnosis not present

## 2018-12-23 DIAGNOSIS — G20 Parkinson's disease: Secondary | ICD-10-CM | POA: Diagnosis not present

## 2018-12-23 DIAGNOSIS — I110 Hypertensive heart disease with heart failure: Secondary | ICD-10-CM | POA: Diagnosis not present

## 2018-12-23 DIAGNOSIS — E039 Hypothyroidism, unspecified: Secondary | ICD-10-CM | POA: Diagnosis not present

## 2018-12-23 DIAGNOSIS — I428 Other cardiomyopathies: Secondary | ICD-10-CM

## 2018-12-23 DIAGNOSIS — G4733 Obstructive sleep apnea (adult) (pediatric): Secondary | ICD-10-CM | POA: Diagnosis not present

## 2018-12-23 DIAGNOSIS — K4090 Unilateral inguinal hernia, without obstruction or gangrene, not specified as recurrent: Secondary | ICD-10-CM | POA: Diagnosis not present

## 2018-12-23 DIAGNOSIS — Z7901 Long term (current) use of anticoagulants: Secondary | ICD-10-CM | POA: Diagnosis not present

## 2018-12-23 DIAGNOSIS — I5022 Chronic systolic (congestive) heart failure: Secondary | ICD-10-CM | POA: Diagnosis not present

## 2018-12-23 DIAGNOSIS — Z515 Encounter for palliative care: Secondary | ICD-10-CM

## 2018-12-23 DIAGNOSIS — I951 Orthostatic hypotension: Secondary | ICD-10-CM | POA: Diagnosis not present

## 2018-12-23 DIAGNOSIS — I420 Dilated cardiomyopathy: Secondary | ICD-10-CM | POA: Diagnosis not present

## 2018-12-23 DIAGNOSIS — D649 Anemia, unspecified: Secondary | ICD-10-CM | POA: Diagnosis not present

## 2018-12-23 DIAGNOSIS — I447 Left bundle-branch block, unspecified: Secondary | ICD-10-CM | POA: Diagnosis not present

## 2018-12-23 DIAGNOSIS — I2510 Atherosclerotic heart disease of native coronary artery without angina pectoris: Secondary | ICD-10-CM | POA: Diagnosis not present

## 2018-12-23 DIAGNOSIS — Z95810 Presence of automatic (implantable) cardiac defibrillator: Secondary | ICD-10-CM | POA: Diagnosis not present

## 2018-12-23 NOTE — Progress Notes (Signed)
Virtual Visit  I connected with      [name] [name]  by a telemedicine application and verified that I am speaking with the correct person using two identifiers.   I discussed the limitations of evaluation and management by telemedicine and the availability of in person appointments. The patient expressed understanding and agreed to proceed.  History of Present Illness: [name] [name] is a 85 y.o. male who would like to discuss hospice.  And heart failure.  [name] [name] was started with hospice therapy due to end-stage heart failure.  In the interim his defibrillator was turned off.  Additionally he has anticoagulation Eliquis and anti-lipid statin was discontinued.  He continues to take medications listed below.  He is having some leg swelling with torsemide and some breathing difficulty but overall about the same as he has been.  He continues to take medication for diabetes is well listed below.  His blood sugars typically around 120.      Observations/Objective: BP 94/66   Pulse 76   Wt Readings from Last 5 Encounters:  11/25/18 200 lb (90.7 kg)  11/14/18 192 lb (87.1 kg)  08/26/18 183 lb (83 kg)  08/18/18 183 lb (83 kg)  07/11/18 186 lb (84.4 kg)   Exam: Normal Speech.  Thought process somewhat tangential.  Mild confusion.  Normal speech.  Lab and Radiology Results No results found for this or any previous visit (from the past 72 hour(s)). No results found.   Assessment and Plan: 85 y.o. male with  Hospice: Answered questions about hospice care.  Continue comanagement.  Use medications as needed.  We will manage slightly worsening lower extremity swelling with increased frequency of torsemide.  Will check back in 1 month or sooner if needed.  We will adjust as needed and continue current medications but discontinue medications if needed in the future.     PDMP not reviewed this encounter. No orders of the defined types were placed in this encounter.  No orders of  the defined types were placed in this encounter.   Follow Up Instructions:    I discussed the assessment and treatment plan with the patient. The patient was provided an opportunity to ask questions and all were answered. The patient agreed with the plan and demonstrated an understanding of the instructions.   The patient was advised to call back or seek an in-person evaluation if the symptoms worsen or if the condition fails to improve as anticipated.  Time: 25 minutes of intraservice time, with >39 minutes of total time during today's visit.      Historical information moved to improve visibility of documentation.  Past Medical History:  Diagnosis Date  . Anemia, unspecified 04/07/2013  . Atrial fibrillation (HCC)    fibrillation/flutter  . BPH (benign prostatic hypertrophy)   . Chronic systolic CHF (congestive heart failure) (HCC)    Nonischemic DCM EF 10% s/p BiV AICD  . Coronary artery disease 2008   nonobstructive ASCAD  . Depression   . Hyperlipidemia   . Hypertension   . Hypothyroidism   . Left bundle branch block   . Nonischemic cardiomyopathy (HCC)    ef 10 %  . OSA (obstructive sleep apnea)    intolerant to CPAP  . Osteoarthritis   . Sleep apnea    stopped using CPAP  . Thrombocytopenia ([place])    Past Surgical History:  Procedure Laterality Date  . CARDIAC DEFIBRILLATOR PLACEMENT     left chest  . CARDIOVERSION N/A 12/29/2012  Procedure: CARDIOVERSION;  Surgeon: Quintella Reichert, MD;  Location: [facility];  Service: Cardiovascular;  Laterality: N/A;  . CHOLECYSTECTOMY    . Colonscopy     reportedly negative per pt; around 2005.    . EP IMPLANTABLE DEVICE N/A 05/09/2016   Procedure: BIVI ICD Generator Changeout;  Surgeon: Duke Salvia, MD;  [facility]: [facility] LAB;  Service: Cardiovascular;  Laterality: N/A;  . TRANSURETHRAL RESECTION OF PROSTATE     for BPH   Social History   Tobacco Use  . Smoking status: Never Smoker  . Smokeless tobacco:  Never Used  Substance Use Topics  . Alcohol use: No   family history includes Cancer in his maternal uncle; Heart disease in his father; Heart failure in his mother; Stroke in his mother.  Medications: Current Outpatient Medications  Medication Sig Dispense Refill  . acetaminophen (TYLENOL) 650 MG CR tablet Take 1 tablet (650 mg total) by mouth every 8 (eight) hours as needed for pain. 90 tablet 3  . AMBULATORY NON FORMULARY MEDICATION Lancets pen type sufficient for three times daily checking. Dispense 3 month supply E11.65 1 each 12  . AMBULATORY NON FORMULARY MEDICATION Test strips for One Touch Verio Flex meter.  Test 3x daily.  Disp 3 month supply.  E11.65 1 each 12  . carbidopa-levodopa (SINEMET) 25-100 MG tablet Take 1 tablet by mouth 3 (three) times daily.    . diclofenac sodium (VOLTAREN) 1 % GEL Apply 4 g topically 4 (four) times daily. To affected joint. 1440 g 3  . digoxin (LANOXIN) 0.125 MG tablet TAKE 1 TABLET BY MOUTH  EVERY OTHER DAY 45 tablet 1  . EQ NATURAL LAXATIVE 8.6 MG tablet TAKE 2 TABLETS BY MOUTH ONCE DAILY AS NEEDED FOR CONSTIPATION    . glimepiride (AMARYL) 4 MG tablet Take 1 tablet (4 mg total) by mouth daily with breakfast. 90 tablet 1  . levothyroxine (SYNTHROID, LEVOTHROID) 137 MCG tablet TAKE 1 TABLET BY MOUTH  DAILY BEFORE BREAKFAST 90 tablet 1  . LORazepam (ATIVAN) 0.5 MG tablet TAKE 1 TABLET BY MOUTH EVERY 4 HOURS AS NEEDED FOR ANXIETY    . Magnesium Oxide 400 MG CAPS Take 1 capsule (400 mg total) by mouth daily. 90 capsule 0  . midodrine (PROAMATINE) 5 MG tablet Take 1 tablet (5 mg total) by mouth 3 (three) times daily with meals. 90 tablet 3  . oxyCODONE (OXY IR/ROXICODONE) 5 MG immediate release tablet TAKE 1 2 (ONE HALF) TABLET BY MOUTH AS NEEDED EVERY 2 HOURS AS NEEDED FOR PAIN OR SHORTNESS OF BREATH    . potassium chloride SA (K-DUR) 20 MEQ tablet TAKE 1 TABLET BY MOUTH  DAILY 90 tablet 1  . QUEtiapine (SEROQUEL) 25 MG tablet TAKE 1 TABLET BY MOUTH  ONCE DAILY AT NIGHT    . torsemide (DEMADEX) 20 MG tablet Take 2 tablets (40 mg total) by mouth daily. 180 tablet 1   No current facility-administered medications for this visit.    Allergies  Allergen Reactions  . Amiodarone Other (See Comments)    Neuro toxicity

## 2018-12-23 NOTE — Telephone Encounter (Signed)
ICD deactivated (VT/VF therapies programmed off) by [employer] rep on 12/16/18 per order from hospice.

## 2018-12-25 ENCOUNTER — Telehealth: Payer: Self-pay | Admitting: Family Medicine

## 2018-12-25 NOTE — Telephone Encounter (Signed)
No he is dying in hospice

## 2018-12-25 NOTE — Telephone Encounter (Signed)
Should we schedule medicare wellness viisit?

## 2018-12-30 ENCOUNTER — Other Ambulatory Visit: Payer: Self-pay

## 2018-12-30 NOTE — Patient Outreach (Signed)
Outreach attempt for EMMI Prevent. Patient headed to hospital at call.

## 2018-12-31 ENCOUNTER — Telehealth (HOSPITAL_COMMUNITY): Payer: Self-pay

## 2018-12-31 ENCOUNTER — Telehealth: Payer: Self-pay | Admitting: Family Medicine

## 2018-12-31 NOTE — Telephone Encounter (Signed)
I received a text message on the morning of 2023-01-22 stating that [name] [name] died at 5 AM at home under the care of hospice peacefully surrounded by his family.  I have completed the death certificate as of the morning of July 8.

## 2018-12-31 NOTE — Telephone Encounter (Signed)
Wife called to say that patient passed away yesterday morning in hospice. She was asking what to do with pacemaker recording device at home. Advised to call [employer] to inquire if it needs to be returned to them or disposed of.  Pt states appreciation of doctors and office staff for care of husband.

## 2019-01-08 ENCOUNTER — Other Ambulatory Visit: Payer: Self-pay | Admitting: Family Medicine

## 2019-01-08 DIAGNOSIS — E039 Hypothyroidism, unspecified: Secondary | ICD-10-CM

## 2019-01-22 ENCOUNTER — Ambulatory Visit: Payer: Medicare Other | Admitting: Family Medicine

## 2019-01-24 DEATH — deceased

## 2019-03-04 ENCOUNTER — Encounter (HOSPITAL_COMMUNITY): Payer: Medicare Other | Admitting: Cardiology
